# Patient Record
Sex: Male | Born: 1975 | Race: White | Hispanic: Yes | Smoking: Current every day smoker
Health system: Southern US, Community
[De-identification: ages and names within clinical notes are randomized; demographics above are authoritative.]

## PROBLEM LIST (undated history)

## (undated) VITALS — BP 137/81 | HR 91 | Temp 97.8°F | Resp 16 | Ht 70.0 in | Wt 195.1 lb

## (undated) DIAGNOSIS — U071 COVID-19: Secondary | ICD-10-CM

## (undated) DIAGNOSIS — F1020 Alcohol dependence, uncomplicated: Secondary | ICD-10-CM

## (undated) DIAGNOSIS — F209 Schizophrenia, unspecified: Secondary | ICD-10-CM

## (undated) DIAGNOSIS — F329 Major depressive disorder, single episode, unspecified: Secondary | ICD-10-CM

## (undated) DIAGNOSIS — E119 Type 2 diabetes mellitus without complications: Secondary | ICD-10-CM

## (undated) DIAGNOSIS — M1990 Unspecified osteoarthritis, unspecified site: Secondary | ICD-10-CM

## (undated) DIAGNOSIS — F319 Bipolar disorder, unspecified: Secondary | ICD-10-CM

## (undated) DIAGNOSIS — K760 Fatty (change of) liver, not elsewhere classified: Secondary | ICD-10-CM

## (undated) DIAGNOSIS — I10 Essential (primary) hypertension: Secondary | ICD-10-CM

## (undated) DIAGNOSIS — I209 Angina pectoris, unspecified: Secondary | ICD-10-CM

## (undated) DIAGNOSIS — R0600 Dyspnea, unspecified: Secondary | ICD-10-CM

## (undated) DIAGNOSIS — F32A Depression, unspecified: Secondary | ICD-10-CM

## (undated) HISTORY — PX: NASAL SEPTUM SURGERY: SHX37

## (undated) HISTORY — PX: OTHER SURGICAL HISTORY: SHX169

---

## 1998-10-28 ENCOUNTER — Emergency Department (HOSPITAL_COMMUNITY): Admission: EM | Admit: 1998-10-28 | Discharge: 1998-10-28 | Payer: Self-pay | Admitting: Emergency Medicine

## 1999-10-10 ENCOUNTER — Encounter: Payer: Self-pay | Admitting: Emergency Medicine

## 1999-10-10 ENCOUNTER — Emergency Department (HOSPITAL_COMMUNITY): Admission: EM | Admit: 1999-10-10 | Discharge: 1999-10-10 | Payer: Self-pay | Admitting: Emergency Medicine

## 1999-10-20 ENCOUNTER — Emergency Department (HOSPITAL_COMMUNITY): Admission: EM | Admit: 1999-10-20 | Discharge: 1999-10-20 | Payer: Self-pay | Admitting: Emergency Medicine

## 1999-10-22 ENCOUNTER — Emergency Department (HOSPITAL_COMMUNITY): Admission: EM | Admit: 1999-10-22 | Discharge: 1999-10-22 | Payer: Self-pay | Admitting: Emergency Medicine

## 2002-02-28 ENCOUNTER — Encounter: Payer: Self-pay | Admitting: Emergency Medicine

## 2002-02-28 ENCOUNTER — Emergency Department (HOSPITAL_COMMUNITY): Admission: EM | Admit: 2002-02-28 | Discharge: 2002-02-28 | Payer: Self-pay | Admitting: Emergency Medicine

## 2003-05-23 ENCOUNTER — Emergency Department (HOSPITAL_COMMUNITY): Admission: EM | Admit: 2003-05-23 | Discharge: 2003-05-23 | Payer: Self-pay | Admitting: Emergency Medicine

## 2003-05-23 ENCOUNTER — Encounter: Payer: Self-pay | Admitting: Emergency Medicine

## 2003-05-30 ENCOUNTER — Emergency Department (HOSPITAL_COMMUNITY): Admission: EM | Admit: 2003-05-30 | Discharge: 2003-05-31 | Payer: Self-pay | Admitting: Emergency Medicine

## 2011-10-07 ENCOUNTER — Inpatient Hospital Stay: Payer: Self-pay | Admitting: Psychiatry

## 2011-10-24 HISTORY — PX: KNEE SURGERY: SHX244

## 2011-11-20 LAB — CBC
MCHC: 34 g/dL (ref 32.0–36.0)
Platelet: 188 10*3/uL (ref 150–440)
RDW: 13.3 % (ref 11.5–14.5)
WBC: 7 10*3/uL (ref 3.8–10.6)

## 2011-11-20 LAB — COMPREHENSIVE METABOLIC PANEL
Alkaline Phosphatase: 59 U/L (ref 50–136)
Calcium, Total: 9 mg/dL (ref 8.5–10.1)
Co2: 25 mmol/L (ref 21–32)
EGFR (Non-African Amer.): >60
SGOT(AST): 55 U/L — ABNORMAL HIGH (ref 15–37)
SGPT (ALT): 79 U/L — ABNORMAL HIGH

## 2011-11-20 LAB — URINALYSIS, COMPLETE
Glucose,UR: NEGATIVE mg/dL (ref 0–75)
RBC,UR: <1 /HPF (ref 0–5)
Specific Gravity: 1.02 (ref 1.003–1.030)
Squamous Epithelial: NONE SEEN
WBC UR: 1 /HPF (ref 0–5)

## 2011-11-20 LAB — DRUG SCREEN, URINE
Barbiturates, Ur Screen: NEGATIVE (ref ?–200)
Cannabinoid 50 Ng, Ur ~~LOC~~: NEGATIVE (ref ?–50)
Methadone, Ur Screen: NEGATIVE (ref ?–300)
Opiate, Ur Screen: NEGATIVE (ref ?–300)
Phencyclidine (PCP) Ur S: NEGATIVE (ref ?–25)
Tricyclic, Ur Screen: NEGATIVE (ref ?–1000)

## 2011-11-20 LAB — ETHANOL: Ethanol: 63 mg/dL

## 2011-11-21 ENCOUNTER — Inpatient Hospital Stay: Payer: Self-pay | Admitting: Psychiatry

## 2012-02-01 ENCOUNTER — Emergency Department (HOSPITAL_COMMUNITY)
Admission: EM | Admit: 2012-02-01 | Discharge: 2012-02-02 | Disposition: A | Discharge status: Home / Self Care | Payer: Self-pay | Attending: Emergency Medicine | Admitting: Emergency Medicine

## 2012-02-01 ENCOUNTER — Encounter (HOSPITAL_COMMUNITY): Payer: Self-pay | Admitting: Emergency Medicine

## 2012-02-01 DIAGNOSIS — S51809A Unspecified open wound of unspecified forearm, initial encounter: Secondary | ICD-10-CM | POA: Insufficient documentation

## 2012-02-01 DIAGNOSIS — F17200 Nicotine dependence, unspecified, uncomplicated: Secondary | ICD-10-CM | POA: Insufficient documentation

## 2012-02-01 DIAGNOSIS — IMO0002 Reserved for concepts with insufficient information to code with codable children: Secondary | ICD-10-CM

## 2012-02-01 DIAGNOSIS — F3110 Bipolar disorder, current episode manic without psychotic features, unspecified: Secondary | ICD-10-CM | POA: Insufficient documentation

## 2012-02-01 DIAGNOSIS — X789XXA Intentional self-harm by unspecified sharp object, initial encounter: Secondary | ICD-10-CM | POA: Insufficient documentation

## 2012-02-01 HISTORY — DX: Major depressive disorder, single episode, unspecified: F32.9

## 2012-02-01 HISTORY — DX: Depression, unspecified: F32.A

## 2012-02-01 HISTORY — DX: Bipolar disorder, unspecified: F31.9

## 2012-02-01 LAB — CBC
HCT: 43.4 % (ref 39.0–52.0)
Hemoglobin: 14.7 g/dL (ref 13.0–17.0)
WBC: 9.4 10*3/uL (ref 4.0–10.5)

## 2012-02-01 LAB — ETHANOL: Alcohol, Ethyl (B): <11 mg/dL (ref 0–11)

## 2012-02-01 LAB — URINALYSIS, ROUTINE W REFLEX MICROSCOPIC
Glucose, UA: NEGATIVE mg/dL
Leukocytes, UA: NEGATIVE
Nitrite: NEGATIVE
Protein, ur: NEGATIVE mg/dL

## 2012-02-01 LAB — COMPREHENSIVE METABOLIC PANEL
ALT: 59 U/L — ABNORMAL HIGH (ref 0–53)
Albumin: 4.8 g/dL (ref 3.5–5.2)
Alkaline Phosphatase: 79 U/L (ref 39–117)
BUN: 17 mg/dL (ref 6–23)
Chloride: 101 mEq/L (ref 96–112)
GFR calc Af Amer: >90 mL/min (ref >90)
Glucose, Bld: 91 mg/dL (ref 70–99)
Potassium: 4.2 mEq/L (ref 3.5–5.1)
Sodium: 138 mEq/L (ref 135–145)
Total Bilirubin: 0.5 mg/dL (ref 0.3–1.2)

## 2012-02-01 MED ORDER — ZOLPIDEM TARTRATE 5 MG PO TABS: 5.0000 mg | ORAL_TABLET | Freq: Every evening | ORAL | Status: DC | PRN

## 2012-02-01 MED ORDER — RISPERIDONE 2 MG PO TABS
3.0000 mg | ORAL_TABLET | Freq: Two times a day (BID) | ORAL | Status: DC
  Administered 2012-02-02: 3 mg via ORAL
  Filled 2012-02-01: qty 1

## 2012-02-01 MED ORDER — ONDANSETRON HCL 4 MG PO TABS
4.0000 mg | ORAL_TABLET | Freq: Three times a day (TID) | ORAL | Status: DC | PRN
Start: 2012-02-01 — End: 2012-02-02

## 2012-02-01 MED ORDER — ACETAMINOPHEN 325 MG PO TABS: 650.0000 mg | ORAL_TABLET | ORAL | Status: DC | PRN

## 2012-02-01 MED ORDER — NICOTINE 21 MG/24HR TD PT24
21.0000 mg | MEDICATED_PATCH | Freq: Every day | TRANSDERMAL | Status: DC
  Administered 2012-02-02: 21 mg via TRANSDERMAL
  Filled 2012-02-01: qty 1

## 2012-02-01 MED ORDER — TRAZODONE HCL 100 MG PO TABS
100.0000 mg | ORAL_TABLET | Freq: Every day | ORAL | Status: DC
  Administered 2012-02-02: 100 mg via ORAL
  Filled 2012-02-01: qty 1

## 2012-02-01 MED ORDER — IBUPROFEN 600 MG PO TABS: 600.0000 mg | ORAL_TABLET | Freq: Three times a day (TID) | ORAL | Status: DC | PRN

## 2012-02-01 MED ORDER — ALUM & MAG HYDROXIDE-SIMETH 200-200-20 MG/5ML PO SUSP: 30.0000 mL | ORAL | Status: DC | PRN

## 2012-02-01 NOTE — ED Notes (Signed)
Pt states "I dont wanna do this anymore, i dont wanna be here", per [employer], pt can be IVC

## 2012-02-01 NOTE — ED Notes (Signed)
Bed:[facility]<BR> Expected date:<BR> Expected time:<BR> Means of arrival:<BR> Comments:<BR> Not Ready

## 2012-02-01 NOTE — ED Provider Notes (Signed)
History     CSN: [id number]  Arrival date & time 02/01/12  2148   First MD Initiated Contact with Patient 02/01/12 2315      Chief Complaint  Patient presents with  . Medical Clearance  . Depression    (Consider location/radiation/quality/duration/timing/severity/associated sxs/prior treatment) HPI  Patient presents to the Emergency department by [employer]. [employer] was called by Behavioral health because patient was not taking medications. The patient is VERY agitated. He states that he was told he can come voluntarily or be IVC'd. He is currently here voluntarily but says if he can't leave soon he will loose everything and be homeless when he gets out of [employer]. He has fresh superficial cuts to his left forearm that says "Maggie". When questioned about it, he says that it is none of my business.   Past Medical History  Diagnosis Date  . Depressed   . Bipolar 1 disorder     History reviewed. No pertinent past surgical history.  No family history on file.  History  Substance Use Topics  . Smoking status: Current Everyday Smoker -- 1.0 packs/day    Types: Cigarettes  . Smokeless tobacco: Not on file  . Alcohol Use: No      Review of Systems  All other systems reviewed and are negative.    Allergies  Penicillins  Home Medications   Current Outpatient Rx  Name Route Sig Dispense Refill  . CLONAZEPAM 0.5 MG PO TABS Oral Take 0.5 mg by mouth 2 (two) times daily as needed.    [name] RISPERIDONE 3 MG PO TABS Oral Take 3 mg by mouth 2 (two) times daily.    . TRAZODONE HCL 100 MG PO TABS Oral Take 100 mg by mouth at bedtime.      BP 128/74  Pulse 98  Temp 98 F (36.7 C)  Resp 16  Wt 240 lb (108.863 kg)  SpO2 99%  Physical Exam  Nursing note and vitals reviewed. Constitutional: He appears well-developed and well-nourished. No distress.       Pt agitated and angry.  HENT:  Head: Normocephalic and atraumatic.  Eyes: Pupils are equal, round, and reactive to light.    Neck: Normal range of motion. Neck supple.  Cardiovascular: Normal rate and regular rhythm.   Pulmonary/Chest: Effort normal.  Abdominal: Soft.  Neurological: He is alert.  Skin: Skin is warm and dry.    ED Course  Procedures (including critical care time)  Labs Reviewed  COMPREHENSIVE METABOLIC PANEL - Abnormal; Notable for the following:    Total Protein 8.4 (*)    AST 39 (*)    ALT 59 (*)    GFR calc non Af Amer 82 (*)    All other components within normal limits  CBC  ETHANOL  URINE RAPID DRUG SCREEN (HOSP PERFORMED)  URINALYSIS, ROUTINE W REFLEX MICROSCOPIC   No results found.   1. Self-harm       MDM  Pt to be  Consulted by [facility].        Dorthula Matas, PA 02/01/12 2341

## 2012-02-01 NOTE — ED Notes (Signed)
Lab bedside.

## 2012-02-01 NOTE — ED Notes (Signed)
Pt alert, nad, presents voluntary by [employer], pt with SI, resp even unlabored, skin pwd

## 2012-02-02 LAB — RAPID URINE DRUG SCREEN, HOSP PERFORMED
Amphetamines: NOT DETECTED
Barbiturates: NOT DETECTED
Benzodiazepines: NOT DETECTED

## 2012-02-02 MED ORDER — CLONAZEPAM 0.5 MG PO TABS: 0.5000 mg | ORAL_TABLET | Freq: Every evening | ORAL | Status: DC | PRN

## 2012-02-02 MED ORDER — RISPERIDONE 3 MG PO TABS: 3.0000 mg | ORAL_TABLET | Freq: Two times a day (BID) | ORAL | Status: DC

## 2012-02-02 MED ORDER — TRAZODONE HCL 100 MG PO TABS: 100.0000 mg | ORAL_TABLET | Freq: Every day | ORAL | Status: DC

## 2012-02-02 NOTE — BH Assessment (Addendum)
Assessment Note   Cory Floyd is an 36 y.o. male who was brought in by [facility] after getting a report that pt has been off of his medications.  Pt is here voluntarily. Pt states he has been under a lot of pressure to finish a remodeling project. He states that his mother hired him to assist her with remodeling a home. He states that he went off of his prescribed Risperdal, Klonopin, and Trazodone because he felt like he could not focus on the remodeling project while on medication. Pt reports "i'm cutting tile and using tools that I could get hurt with if I loose focus and those medications make me loose focus." Pt states he needs to finish the project or his mother will not let him continue to live with her. Pt expresses concerns about inpatient treatment treatment, stating he if he stays, he will become homeless. Pt also presents to the ED with superficial cuts on his arm that spell "Maggie." When asked about cuts, pt reports his girlfriend of five years broke up with him 2 days ago. He states he cut his arm because "its a different kind of pain, an easier pain" he further states " I wasn't trying to kill myself, I was trying to make the other pain go away."  Pt denies and SI, HI, AHVH, and SA. Pt reports he has history of [facility] and has attempted suicide in the past. Pt reports numerous prior mental health treatment at [facility] and [facility] in [place]. Pt states his last hospitalization was 6 months ago. He states he has ongoing outpatient therapy with [facility] in [place], [phone]).He states he has an appointment with [facility] tomorrow at 1:30. Pt states he has had difficulty sleeping for the past 2 weeks and reports a poor appetite. Pt reports he feels like he can contract for safety at this time and is requesting to be released with a plan to follow up with his current providers.        Axis I: Bipolar, Manic Axis II: Deferred Axis III:  Past Medical History  Diagnosis  Date  . Depressed   . Bipolar 1 disorder    Axis IV: other psychosocial or environmental problems, problems related to social environment and problems with primary support group Axis V: 31-40 impairment in reality testing  Past Medical History:  Past Medical History  Diagnosis Date  . Depressed   . Bipolar 1 disorder     History reviewed. No pertinent past surgical history.  Family History: No family history on file.  Social History:  reports that he has been smoking Cigarettes.  He has been smoking about 1 pack per day. He does not have any smokeless tobacco history on file. He reports that he does not drink alcohol. His drug history not on file.  Additional Social History:  Alcohol / Drug Use History of alcohol / drug use?: No history of alcohol / drug abuse Allergies:  Allergies  Allergen Reactions  . Penicillins     Home Medications:  Medications Prior to Admission  Medication Dose Route Frequency Provider Last Rate Last Dose  . acetaminophen (TYLENOL) tablet 650 mg  650 mg Oral Q4H PRN Dorthula Matas, PA      . alum & mag hydroxide-simeth (MAALOX/MYLANTA) 200-200-20 MG/5ML suspension 30 mL  30 mL Oral PRN Dorthula Matas, PA      . ibuprofen (ADVIL,MOTRIN) tablet 600 mg  600 mg Oral Q8H PRN Dorthula Matas, PA      .  nicotine (NICODERM CQ - dosed in mg/24 hours) patch 21 mg  21 mg Transdermal Daily Dorthula Matas, PA   21 mg at 02/02/12 0029  . ondansetron (ZOFRAN) tablet 4 mg  4 mg Oral Q8H PRN Dorthula Matas, PA      . risperiDONE (RISPERDAL) tablet 3 mg  3 mg Oral BID Dorthula Matas, PA   3 mg at 02/02/12 0030  . traZODone (DESYREL) tablet 100 mg  100 mg Oral QHS Dorthula Matas, PA   100 mg at 02/02/12 0030  . zolpidem (AMBIEN) tablet 5 mg  5 mg Oral QHS PRN Dorthula Matas, PA       No current outpatient prescriptions on file as of 02/01/2012.    OB/GYN Status:  No LMP for male patient.  General Assessment Data [facility] of Assessment[facility]  Arrangements: Parent Can pt return to current living arrangement?: Yes Admission Status: Voluntary Is patient capable of signing voluntary admission?: Yes Transfer from: Acute Hospital Referral Source: MD  Education Status Is patient currently in school?: No  Risk to self Suicidal Ideation: No Suicidal Intent: No Is patient at risk for suicide?: Yes Suicidal Plan?: No Access to Means: No What has been your use of drugs/alcohol within the last 12 months?: denies Previous Attempts/Gestures: Yes How many times?: 3  Other Self Harm Risks: cutting Triggers for Past Attempts: Other personal contacts;Hallucinations Intentional Self Injurious Behavior: Cutting Family Suicide History: No Recent stressful life event(s):  (reports pressures from work) Persecutory voices/beliefs?: No Depression: Yes Depression Symptoms: Feeling angry/irritable;Insomnia Substance abuse history and/or treatment for substance abuse?: No Suicide prevention information given to non-admitted patients: Not applicable  Risk to Others Homicidal Ideation: No Thoughts of Harm to Others: No Current Homicidal Intent: No Current Homicidal Plan: No Access to Homicidal Means: No Identified Victim: none History of harm to others?: No Assessment of Violence: None Noted Violent Behavior Description: cooperative Does patient have access to weapons?: No Criminal Charges Pending?: No Does patient have a court date: No  Psychosis Hallucinations: None noted (pt reports history of AHVH but none currently) Delusions: None noted  Mental Status Report Appear/Hygiene: Disheveled Eye Contact: Good Motor Activity: Unremarkable Speech: Logical/coherent Level of Consciousness: Alert Mood: Anxious Affect: Anxious Anxiety Level: Severe Thought Processes: Coherent;Relevant Judgement: Impaired Orientation: Person;Place;Time;Situation Obsessive Compulsive Thoughts/Behaviors: Minimal  Cognitive Functioning Concentration:  Normal Memory: Recent Intact;Remote Intact IQ: Average Insight: Fair Impulse Control: Poor Appetite: Poor Weight Loss: 0  Weight Gain: 0  Sleep: Decreased Total Hours of Sleep:  (reports hasn't slept much for 2 weeks.) Vegetative Symptoms: None  Prior Inpatient Therapy Prior Inpatient Therapy: Yes Prior Therapy Dates: 2011,2012 Prior [facility]ilty[facility]Provider[facility] Reason for Treatment: bipolar and suicide attempt  Prior Outpatient Therapy Prior Outpatient Therapy: Yes Prior Therapy Dates: ongoing Prior Therapy Facilty/Provider(s): [facility] Reason for Treatment: bipolar disorder  ADL Screening (condition at time of admission) Patient's cognitive ability adequate to safely complete daily activities?: Yes Patient able to express need for assistance with ADLs?: Yes Independently performs ADLs?: Yes Weakness of Legs: None Weakness of Arms/Hands: None  Home Assistive Devices/Equipment Home Assistive Devices/Equipment: None    Abuse/Neglect Assessment (Assessment to be complete while patient is alone) Physical Abuse: Denies Verbal Abuse: Denies Sexual Abuse: Denies Exploitation of patient/patient's resources: Denies Self-Neglect: Denies Values / Beliefs Cultural Requests During Hospitalization: None Spiritual Requests During Hospitalization: None   Advance Directives (For Healthcare) Advance Directive: Patient does not have advance directive;Patient would not  like information Nutrition Screen Diet: Regular Unintentional weight loss greater than 10lbs within the last month: No Problems chewing or swallowing foods and/or liquids: No Home Tube Feeding or Total Parenteral Nutrition (TPN): No Patient appears severely malnourished: No  Additional Information 1:1 In Past 12 Months?: No CIRT Risk: No Elopement Risk: No Does patient have medical clearance?: Yes     Disposition:  Disposition Disposition of Patient: Other  dispositions (pending telepsych)  On Site Evaluation by:   Reviewed with Physician:     Marjean Donna 02/02/2012 2:38 AM

## 2012-02-02 NOTE — ED Provider Notes (Signed)
Pt seen and evaluated in the psych ED.  He is calm, resting comfortably, requesting something to eat, and also requesting to be discharged as he states he needs to go to work.  Telepsych consult has been obtained and states patient is cleared for discharge- should be discharged on risperdal 3mg  po qD, trazadone 100mg  po qHS, klonopin 0.5mg  po qHS- I have given him small rx for each of these.  He is strongly encouraged to f/u with his doctor or mental health provider in the next several days.    Ethelda Chick, MD 02/02/12 [phone]

## 2012-02-02 NOTE — Discharge Instructions (Signed)
Return to the ED with any concerns including thoughts or feelings of suicide or homicide 

## 2012-02-03 NOTE — ED Provider Notes (Signed)
Medical screening examination/treatment/procedure(s) were performed by non-physician practitioner and as supervising physician I was immediately available for consultation/collaboration.   Travez Stancil, MD 02/03/12 0048 

## 2012-03-27 ENCOUNTER — Inpatient Hospital Stay: Payer: Self-pay | Admitting: Psychiatry

## 2012-03-27 LAB — COMPREHENSIVE METABOLIC PANEL
Alkaline Phosphatase: 73 U/L (ref 50–136)
Anion Gap: 10 (ref 7–16)
Chloride: 107 mmol/L (ref 98–107)
EGFR (Non-African Amer.): >60
Potassium: 3.8 mmol/L (ref 3.5–5.1)
SGOT(AST): 38 U/L — ABNORMAL HIGH (ref 15–37)
SGPT (ALT): 43 U/L
Sodium: 142 mmol/L (ref 136–145)

## 2012-03-27 LAB — DRUG SCREEN, URINE
Amphetamines, Ur Screen: NEGATIVE (ref ?–1000)
Barbiturates, Ur Screen: NEGATIVE (ref ?–200)
Cannabinoid 50 Ng, Ur ~~LOC~~: NEGATIVE (ref ?–50)
Cocaine Metabolite,Ur ~~LOC~~: NEGATIVE (ref ?–300)
MDMA (Ecstasy)Ur Screen: NEGATIVE (ref ?–500)
Methadone, Ur Screen: NEGATIVE (ref ?–300)
Opiate, Ur Screen: NEGATIVE (ref ?–300)
Tricyclic, Ur Screen: NEGATIVE (ref ?–1000)

## 2012-03-27 LAB — ETHANOL: Ethanol %: 0.185 % — ABNORMAL HIGH (ref 0.000–0.080)

## 2012-03-27 LAB — CBC
MCH: 31.3 pg (ref 26.0–34.0)
MCV: 96 fL (ref 80–100)
RBC: 4.56 10*6/uL (ref 4.40–5.90)
RDW: 13.8 % (ref 11.5–14.5)

## 2012-03-27 LAB — SALICYLATE LEVEL: Salicylates, Serum: 4 mg/dL — ABNORMAL HIGH

## 2012-03-27 LAB — TSH: Thyroid Stimulating Horm: 2.13 u[IU]/mL

## 2012-03-30 ENCOUNTER — Inpatient Hospital Stay: Payer: Self-pay | Admitting: Psychiatry

## 2012-03-30 LAB — DRUG SCREEN, URINE
Amphetamines, Ur Screen: NEGATIVE (ref ?–1000)
Barbiturates, Ur Screen: NEGATIVE (ref ?–200)
Cocaine Metabolite,Ur ~~LOC~~: NEGATIVE (ref ?–300)
MDMA (Ecstasy)Ur Screen: NEGATIVE (ref ?–500)
Phencyclidine (PCP) Ur S: NEGATIVE (ref ?–25)
Tricyclic, Ur Screen: NEGATIVE (ref ?–1000)

## 2012-03-30 LAB — COMPREHENSIVE METABOLIC PANEL
Albumin: 4.3 g/dL (ref 3.4–5.0)
Alkaline Phosphatase: 74 U/L (ref 50–136)
Anion Gap: 11 (ref 7–16)
BUN: 9 mg/dL (ref 7–18)
Calcium, Total: 8.9 mg/dL (ref 8.5–10.1)
Chloride: 105 mmol/L (ref 98–107)
Co2: 24 mmol/L (ref 21–32)
Creatinine: 0.85 mg/dL (ref 0.60–1.30)
EGFR (African American): >60
EGFR (Non-African Amer.): >60
Glucose: 79 mg/dL (ref 65–99)
Osmolality: 277 (ref 275–301)
Potassium: 3.5 mmol/L (ref 3.5–5.1)
SGOT(AST): 37 U/L (ref 15–37)
SGPT (ALT): 53 U/L
Sodium: 140 mmol/L (ref 136–145)
Total Protein: 8.1 g/dL (ref 6.4–8.2)

## 2012-03-30 LAB — CBC
HCT: 43.9 % (ref 40.0–52.0)
HGB: 14.6 g/dL (ref 13.0–18.0)
MCV: 95 fL (ref 80–100)
RBC: 4.62 10*6/uL (ref 4.40–5.90)
RDW: 13.7 % (ref 11.5–14.5)

## 2012-04-02 LAB — CARBAMAZEPINE LEVEL, TOTAL: Carbamazepine: 6.5 ug/mL (ref 4.0–12.0)

## 2012-04-24 LAB — CBC
HCT: 39.9 % — ABNORMAL LOW (ref 40.0–52.0)
HGB: 13.3 g/dL (ref 13.0–18.0)
MCH: 31.6 pg (ref 26.0–34.0)
MCHC: 33.3 g/dL (ref 32.0–36.0)
RDW: 13.5 % (ref 11.5–14.5)

## 2012-04-24 LAB — URINALYSIS, COMPLETE
Bacteria: NONE SEEN
Blood: NEGATIVE
Glucose,UR: NEGATIVE mg/dL (ref 0–75)
Leukocyte Esterase: NEGATIVE
Nitrite: NEGATIVE
RBC,UR: NONE SEEN /HPF (ref 0–5)

## 2012-04-24 LAB — TSH: Thyroid Stimulating Horm: 0.985 u[IU]/mL

## 2012-04-24 LAB — COMPREHENSIVE METABOLIC PANEL
Bilirubin,Total: 0.2 mg/dL (ref 0.2–1.0)
Calcium, Total: 8.1 mg/dL — ABNORMAL LOW (ref 8.5–10.1)
Chloride: 108 mmol/L — ABNORMAL HIGH (ref 98–107)
Co2: 29 mmol/L (ref 21–32)
EGFR (African American): >60
EGFR (Non-African Amer.): >60
SGOT(AST): 35 U/L (ref 15–37)
SGPT (ALT): 42 U/L

## 2012-04-24 LAB — ETHANOL: Ethanol: 176 mg/dL

## 2012-04-25 ENCOUNTER — Inpatient Hospital Stay: Payer: Self-pay | Admitting: Internal Medicine

## 2012-04-25 LAB — DRUG SCREEN, URINE
Barbiturates, Ur Screen: NEGATIVE (ref ?–200)
Benzodiazepine, Ur Scrn: NEGATIVE (ref ?–200)
Cocaine Metabolite,Ur ~~LOC~~: NEGATIVE (ref ?–300)
Methadone, Ur Screen: NEGATIVE (ref ?–300)
Tricyclic, Ur Screen: NEGATIVE (ref ?–1000)

## 2012-04-25 LAB — BASIC METABOLIC PANEL
BUN: 8 mg/dL (ref 7–18)
Calcium, Total: 8.1 mg/dL — ABNORMAL LOW (ref 8.5–10.1)
Creatinine: 1.02 mg/dL (ref 0.60–1.30)
EGFR (African American): >60
EGFR (Non-African Amer.): >60
Glucose: 114 mg/dL — ABNORMAL HIGH (ref 65–99)
Potassium: 4.1 mmol/L (ref 3.5–5.1)
Sodium: 144 mmol/L (ref 136–145)

## 2012-04-26 LAB — CBC WITH DIFFERENTIAL/PLATELET
Basophil #: 0 10*3/uL (ref 0.0–0.1)
HCT: 39.4 % — ABNORMAL LOW (ref 40.0–52.0)
Lymphocyte #: 1.7 10*3/uL (ref 1.0–3.6)
MCHC: 33.6 g/dL (ref 32.0–36.0)
MCV: 95 fL (ref 80–100)
Monocyte %: 6.9 %
Neutrophil #: 7.1 10*3/uL — ABNORMAL HIGH (ref 1.4–6.5)
RDW: 13.4 % (ref 11.5–14.5)
WBC: 9.6 10*3/uL (ref 3.8–10.6)

## 2012-04-26 LAB — BASIC METABOLIC PANEL
Co2: 28 mmol/L (ref 21–32)
Creatinine: 1.15 mg/dL (ref 0.60–1.30)
EGFR (African American): >60
EGFR (Non-African Amer.): >60
Sodium: 140 mmol/L (ref 136–145)

## 2012-04-29 ENCOUNTER — Inpatient Hospital Stay: Payer: Self-pay | Admitting: Unknown Physician Specialty

## 2012-10-30 LAB — COMPREHENSIVE METABOLIC PANEL
Albumin: 4.1 g/dL (ref 3.4–5.0)
Anion Gap: 12 (ref 7–16)
Chloride: 105 mmol/L (ref 98–107)
Co2: 22 mmol/L (ref 21–32)
EGFR (Non-African Amer.): >60
Glucose: 88 mg/dL (ref 65–99)
SGPT (ALT): 71 U/L (ref 12–78)
Total Protein: 8.4 g/dL — ABNORMAL HIGH (ref 6.4–8.2)

## 2012-10-30 LAB — CBC
HCT: 40.6 % (ref 40.0–52.0)
HGB: 13.8 g/dL (ref 13.0–18.0)
MCH: 31.6 pg (ref 26.0–34.0)
MCV: 93 fL (ref 80–100)
Platelet: 197 10*3/uL (ref 150–440)
RBC: 4.37 10*6/uL — ABNORMAL LOW (ref 4.40–5.90)
RDW: 12.7 % (ref 11.5–14.5)
WBC: 7.9 10*3/uL (ref 3.8–10.6)

## 2012-10-30 LAB — ACETAMINOPHEN LEVEL: Acetaminophen: <2 ug/mL

## 2012-10-30 LAB — ETHANOL: Ethanol %: 0.123 % — ABNORMAL HIGH (ref 0.000–0.080)

## 2012-10-31 ENCOUNTER — Inpatient Hospital Stay: Payer: Self-pay | Admitting: Psychiatry

## 2012-10-31 LAB — URINALYSIS, COMPLETE
Bacteria: NONE SEEN
Bilirubin,UR: NEGATIVE
Blood: NEGATIVE
Glucose,UR: NEGATIVE mg/dL (ref 0–75)
Ketone: NEGATIVE
Leukocyte Esterase: NEGATIVE
Nitrite: NEGATIVE
Ph: 6 (ref 4.5–8.0)
Protein: NEGATIVE
RBC,UR: NONE SEEN /HPF (ref 0–5)
Specific Gravity: 1.002 (ref 1.003–1.030)
Squamous Epithelial: NONE SEEN
WBC UR: NONE SEEN /HPF (ref 0–5)

## 2012-10-31 LAB — DRUG SCREEN, URINE
Amphetamines, Ur Screen: NEGATIVE (ref ?–1000)
Barbiturates, Ur Screen: NEGATIVE (ref ?–200)
Benzodiazepine, Ur Scrn: NEGATIVE (ref ?–200)
Cannabinoid 50 Ng, Ur ~~LOC~~: NEGATIVE (ref ?–50)
Cocaine Metabolite,Ur ~~LOC~~: NEGATIVE (ref ?–300)
Methadone, Ur Screen: NEGATIVE (ref ?–300)
Tricyclic, Ur Screen: NEGATIVE (ref ?–1000)

## 2012-10-31 LAB — SALICYLATE LEVEL: Salicylates, Serum: 6.1 mg/dL — ABNORMAL HIGH

## 2012-11-01 LAB — BEHAVIORAL MEDICINE 1 PANEL
Albumin: 3.6 g/dL (ref 3.4–5.0)
Alkaline Phosphatase: 76 U/L (ref 50–136)
Anion Gap: 9 (ref 7–16)
BUN: 15 mg/dL (ref 7–18)
Basophil #: 0 10*3/uL (ref 0.0–0.1)
Basophil %: 0.8 %
Bilirubin,Total: 0.2 mg/dL (ref 0.2–1.0)
Calcium, Total: 8.8 mg/dL (ref 8.5–10.1)
Chloride: 107 mmol/L (ref 98–107)
Co2: 24 mmol/L (ref 21–32)
Creatinine: 0.93 mg/dL (ref 0.60–1.30)
EGFR (African American): >60
EGFR (Non-African Amer.): >60
Eosinophil #: 0.2 10*3/uL (ref 0.0–0.7)
Eosinophil %: 2.7 %
Glucose: 108 mg/dL — ABNORMAL HIGH (ref 65–99)
HCT: 39.1 % — ABNORMAL LOW (ref 40.0–52.0)
HGB: 13.1 g/dL (ref 13.0–18.0)
Lymphocyte #: 2.1 10*3/uL (ref 1.0–3.6)
Lymphocyte %: 33.4 %
MCH: 31.5 pg (ref 26.0–34.0)
MCHC: 33.5 g/dL (ref 32.0–36.0)
MCV: 94 fL (ref 80–100)
Monocyte #: 0.5 x10 3/mm (ref 0.2–1.0)
Monocyte %: 8.8 %
Neutrophil #: 3.4 10*3/uL (ref 1.4–6.5)
Neutrophil %: 54.3 %
Osmolality: 281 (ref 275–301)
Platelet: 180 10*3/uL (ref 150–440)
Potassium: 3.9 mmol/L (ref 3.5–5.1)
RBC: 4.16 10*6/uL — ABNORMAL LOW (ref 4.40–5.90)
RDW: 13.1 % (ref 11.5–14.5)
SGOT(AST): 34 U/L (ref 15–37)
SGPT (ALT): 59 U/L (ref 12–78)
Sodium: 140 mmol/L (ref 136–145)
Thyroid Stimulating Horm: 1.22 u[IU]/mL
Total Protein: 7.2 g/dL (ref 6.4–8.2)
WBC: 6.3 10*3/uL (ref 3.8–10.6)

## 2012-11-01 LAB — SALICYLATE LEVEL: Salicylates, Serum: 4.1 mg/dL — ABNORMAL HIGH

## 2013-07-25 ENCOUNTER — Ambulatory Visit: Payer: Self-pay | Admitting: Specialist

## 2013-08-11 ENCOUNTER — Ambulatory Visit: Payer: Self-pay | Admitting: Specialist

## 2013-08-18 ENCOUNTER — Ambulatory Visit: Payer: Self-pay | Admitting: Specialist

## 2014-02-05 HISTORY — PX: COLONOSCOPY: SHX174
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
## 2015-02-12 NOTE — H&P (Signed)
PATIENT NAME:  Cory Floyd, Cory Floyd MR#:  [id number] DATE OF BIRTH:  [birth year]  DATE OF ADMISSION:  10/31/2012  REFERRING PHYSICIAN: Dr. Daryel November.   ATTENDING PHYSICIAN: Kristine Linea, MD  IDENTIFYING DATA: The patient is a 39 year old male with history of bipolar disorder.   CHIEF COMPLAINT: "They changed my meds."   HISTORY OF PRESENT ILLNESS: The patient is a patient of Dr. Rogers Blocker at [facility]. Several weeks ago, Dr. Rogers Blocker made medication changes. He discontinued Tegretol that the patient has been taking for the past 2 or 3 years and increased Zyprexa from 10 to 20 mg. The patient complains that he started experiencing mood swings with deep depression and thoughts of suicide, with a plan to overdose on medications. He also feels that he has been shaking and believes that this is from Zyprexa. He wants medication change. He endorses poor sleep, decreased appetite, anhedonia, feeling of guilt, hopelessness, worthlessness, irritability, social isolation, poor energy and concentration, increased anxiety. The patient has been under considerable stress. He had a several-year relationship with a woman in [place]. He went there in November and spent a whole month with her. He felt that the relationship was strong. Before Christmas, he has learned that the woman does not want to have anything to do with him. He is very lonely and depressed.   PAST PSYCHIATRIC HISTORY: The patient has multiple hospitalizations following multiple suicide attempts. He has been tried on numerous medications including lithium, Depakote, Seroquel, Invega, Sustenna, Zyprexa. He has been stable and happy with a combination of Tegretol and Zyprexa, and now, he is willing to go back to lithium. He denies excessive drinking, substance abuse or prescription pill misuse.   FAMILY PSYCHIATRIC HISTORY: His uncle committed suicide. He has an aunt and the mother with bipolar.   PAST MEDICAL HISTORY: None.   ALLERGIES:  PENICILLIN.   MEDICATIONS ON ADMISSION: Zyprexa 20 mg daily.   SOCIAL HISTORY: He is unemployed. He applied for disability. He is waiting for a court date. He has a history of arrest for indecent liberties with a child. There is a history of DUIs. He denies any current charges pending. He admits to excessive drinking in the past, but not currently.   REVIEW OF SYSTEMS:   CONSTITUTIONAL: No fevers or chills. Positive for weight gain.  EYES: No double or blurred vision.  ENT: No hearing loss.  RESPIRATORY: No shortness of breath or cough.  CARDIOVASCULAR: No chest pain or orthopnea.  GASTROINTESTINAL: No abdominal pain, nausea, vomiting or diarrhea.  GU: No incontinence or frequency.  ENDOCRINE: No heat or cold intolerance.  LYMPHATIC: No anemia or easy bruising.  INTEGUMENTARY: No acne or rash.  MUSCULOSKELETAL: No muscle or joint pain.  NEUROLOGIC: No tingling or weakness.  PSYCHIATRIC: See history of present illness for details.   PHYSICAL EXAMINATION:  VITAL SIGNS: Blood pressure 114/70, pulse 98, respirations 20, temperature 96.5.  GENERAL: This is an obese male in no acute distress.  HEENT: The pupils are equal, round and reactive to light.  NECK: Supple. No thyromegaly.  LUNGS: Clear to auscultation. No dullness to percussion.  HEART: Regular rhythm and rate. No murmurs, rubs or gallops.  ABDOMEN: Soft, nontender, nondistended. Positive bowel sounds.  MUSCULOSKELETAL: Normal muscle strength in all extremities.  SKIN: No rashes or bruises.  LYMPHATIC: No cervical adenopathy.  NEUROLOGIC: Cranial nerves II through XII are intact.   LABORATORY DATA: Chemistries are within normal limits. Blood alcohol level 0.123. LFTs within normal limits. TSH 1.22. Urine tox screen negative  for substances. CBC within normal limits. Urinalysis is not suggestive of urinary tract infection. Serum acetaminophen less than 2. Serum salicylates 4.1.   MENTAL STATUS EXAMINATION ON ADMISSION: The  patient is alert and oriented to person, place, time and situation. He is pleasant, polite and cooperative. He is wearing hospital scrubs and a yellow shirt. He maintains good eye contact. His speech is of normal rhythm, rate and volume. Mood is depressed with a tearful affect. Thought process is logical and goal oriented. Thought content: He denies suicidal or homicidal ideations at the moment, but was admitted following worsening of depression with suicidal ideation and a plan to overdose on pills. There are no delusions or paranoia. There are no auditory or visual hallucinations. His cognition is grossly intact. He registers 3 out of 3 and recalls 3 out of 3 objects after 5 minutes. He can spell world forwards and backwards. He knows the current president. His insight and judgment are fair.   SUICIDE RISK ASSESSMENT ON ADMISSION: This is a patient with long history of depression and mood instability who experienced worsening of depression and suicidal ideation in the context of recent loss, substance abuse and medication changes.   INITIAL DIAGNOSES:  AXIS I: Bipolar affective disorder, not otherwise specified. Alcohol abuse.  AXIS II: Deferred.  AXIS III: Obesity.  AXIS IV: Mental illness, substance abuse, employment, financial, family conflict, primary support, access to care.  AXIS V: Global assessment of functioning on admission 25.   PLAN: The patient was admitted to [facility] for safety, stabilization and medication management. He was initially placed on suicide precautions and was closely monitored for any unsafe behaviors. He underwent full psychiatric and risk assessment. He received pharmacotherapy, individual and group psychotherapy, substance abuse counseling and support from therapeutic milieu.   1. Suicidal ideation: The patient is able to contract for safety.  2. Mood: The patient is asking to restart a mood stabilizer and chooses  lithium, and decrease Zyprexa to his regular dose of 10 mg.  3. Alcohol dependence. He was started on a CIWA protocol.  4. Substance abuse treatment. At present, the patient minimizes his problems and declines treatment.  5. Disposition: He will be most likely discharged to home. He will follow up with [facility].   ____________________________ Ellin GoodieJolanta B. Jennet MaduroPucilowska, MD jbp:OSi D: 11/01/2012 11:45:56 ET T: 11/01/2012 12:09:17 ET JOB#: [id number]  cc: Hank Walling B. Jennet MaduroPucilowska, MD, <Dictator> Shari ProwsJOLANTA B Jalexa Pifer MD ELECTRONICALLY SIGNED 11/04/2012 0:07

## 2015-02-12 NOTE — Op Note (Signed)
PATIENT NAME:  Cory Floyd, Cory Floyd MR#:  [id number] DATE OF BIRTH:  [birth year]  DATE OF PROCEDURE:  08/18/2013  PREOPERATIVE DIAGNOSES:   1.  Complete old tear of the right anterior cruciate ligament. 2.  Completely displaced, old, chronic tear of the right medial meniscus.  3.  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   POSTOPERATIVE DIAGNOSES:   1.  Complete old tear of the right anterior cruciate ligament. 2.  Completely displaced, old, chronic tear of the right medial meniscus.  3.  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   PROCEDURES:  1.  Arthroscopically assisted right anterior cruciate ligament reconstruction using patellar tendon and bone.  2.  Arthroscopic partial right medial meniscectomy.  3.  Arthroscopic chondroplasty, medial femoral condyle.  4.  Arthroscopic partial synovectomy for visualization.   SURGEON: Valinda Hoar, M.D.   ANESTHESIA: General endotracheal plus femoral nerve block.   COMPLICATIONS: None.   DRAINS: None.   ESTIMATED BLOOD LOSS: 50 mL   REPLACEMENT: None.   DESCRIPTION OF PROCEDURE: The patient was brought to the Operating Room, where he underwent satisfactory general endotracheal anesthesia, after a femoral nerve block on the right. The right leg was prepped and draped in sterile fashion, and arthroscopy carried out through standard portals. There was extensive scarring of the knee, which made visualization extremely difficult. Tedious resection of synovium and scar was done create visualization. Once this was accomplished, the medial meniscus was seen to be wedged anteriorly between the tibia and femur and the intercondylar notch. This was pushed back into place but was not reducible. It was obviously chronic in nature, and there was no way to get it into a repairable position. Therefore, the torn portion of the meniscus was tediously removed with basket forceps, ArthroCare wand,  and motorized resectors.   Once this was accomplished, there was seen to be a remnant of meniscus in place, which was trimmed up and left in place. There was grade II chondromalacia on the medial femoral condyle, which was gently debrided. Intercondylar notch showed complete tearing of the anterior cruciate ligament with some scar tissue present. This was excised. The lateral compartment showed normal meniscus and articular surfaces. The trochlea of the femur had grade II chondromalacia; the patella had minimal changes. Anterior synovium was resected medially and laterally for visualization. Notchplasty was carried out.   The arthroscope was removed and a longitudinal incision made from the joint line distally to the tibial tubercle anteromedially. Dissection was carried out sharply through subcutaneous tissue and the peritenon was reflected laterally. An 11 mm graft was taken from the midportion of the patellar tendon, with patellar bone and tibial tubercle bone as well. This was contoured to an 11 mm graft on the back table with drill holes. The arthroscope was reintroduced and the tibial alignment jig inserted.   A guidepin was inserted in good position. An 11 mm tunnel was made in the tibia at 60 degrees. This was smoothed with a tamp and a rasp. The 7 mm femoral over-the-top guide was inserted and the Beath pin brought out on the anterior thigh with the knee flexed to 90 degrees. A femoral tunnel was made with an 11 mm reamer to 25 to 30 mm in depth. The graft was then placed with Orthocord sutures and was drawn up into the knee. The femoral graft was fixed with an 8 x 30 mm Milagro screw, fixed securely.   The knee was  then brought out to near extension and the tibial graft was fixed with a 9 x 30 Milagro screw. Two Orthocord sutures and the tibial graft soft tissue for reinforcement. The knee flexed well. The knee was thoroughly irrigated. The tourniquet was inflated for 38 mm during the insertion of  the Milagro screws and then deflated. The patellar donor site was filled with bone graft, and the peritenon was repaired over this, and periosteum was repaired with 3-0 Vicryl over this and the tendon.   The subcutaneous tissue was closed with 2-0 Vicryl and the skin was closed with staples. 0.5% Marcaine with epinephrine and morphine was placed in the joint. Dry sterile dressing with a Polar Care and knee immobilizer were applied. The patient had excellent circulation in the foot. He was awakened and taken to recovery in good condition.   ____________________________ Valinda HoarHoward E. Ladaja Yusupov, MD hem:cg D: 08/18/2013 19:14:25 ET T: 08/19/2013 05:01:57 ET JOB#: [id number]  cc: Valinda HoarHoward E. Donelle Baba, MD, <Dictator> Valinda HoarHOWARD E Deshia Vanderhoof MD ELECTRONICALLY SIGNED 08/19/2013 10:23
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
## 2015-02-14 NOTE — Discharge Summary (Signed)
PATIENT NAME:  Cory Floyd, Cory Floyd MR#:  [id number] DATE OF BIRTH:  1976/03/23  DATE OF ADMISSION:  04/25/2012 DATE OF DISCHARGE:  04/29/2012  ADMITTING DIAGNOSIS: Unresponsiveness, drug overdose.   DISCHARGE DIAGNOSES:  1. Intentional drug overdose with trazodone as well as likely multiple other psychiatric medications. [facility]  2. Acute respiratory failure likely due to acute encephalopathy with intubation for airway protection.  3. Acute encephalopathy due to drug overdose. Now his mental status is back to baseline.  4. Hyperglycemia, reactive.  5. Bipolar disorder with suicidal ideation and drug overdose under IVC and medically stable for transfer to Behavioral Medicine.   CONSULTANTS:  1. Erin FullingKurian Kasa, MD. 2. Greer EeWilliam Ryan, MD.  PERTINENT LABS AND EVALUATIONS: TUDS were MDMA positive.   Urinalysis was nitrite negative, leukocyte negative.   TSH 0.985. Alcohol level 0.176. LFTs were normal. BMP: Glucose 115, BUN 9, creatinine 1.08, sodium 143, potassium 3.6, chloride 108.   EKG showed sinus tachycardia with possible septal infarct.   Repeat EKG on 04/27/2012 showed normal sinus rhythm without any ST-T wave changes.   CT scan of the head showed no evidence of acute infarct or any other abnormality.   Chest x-ray showed normal cardiac silhouette. There are no infiltrates noted.   HOSPITAL COURSE: Please refer to the history and physical done by the admitting physician. The patient is a 39 year old with multiple psychiatric admissions with history of bipolar or manic depressive illness who was transferred to the ED after he was found unresponsive. Somebody had called the police and told them that he had a drug overdose. The patient was intubated because he was unresponsive and unable to protect his airway. He was kept on the ventilator. His chest x-ray showed no pulmonary pathology. The patient self-extubated himself on  04/25/2012. He has not had any further respiratory problems. He was monitored for any cardiac arrhythmias or any other pulmonary complications which the patient did not have. He was awaiting a bed to psychiatry. Currently he is stable for discharge to Behavioral Medicine.   DISCHARGE MEDICATIONS:  1. Carbamazepine 100 mg 3 tabs twice a day. 2. Olanzapine 5 mg at bedtime.  3. Tylenol 650 mg every four hours p.r.n. pain.  4. Omeprazole 20 mg daily.  HOME OXYGEN: None.   DIET: Regular.   ACTIVITY: As tolerated.   DISPOSITION: Per psychiatry once discharged from Behavioral Medicine.   TIME SPENT: 32 minutes. ____________________________ Lacie ScottsShreyang H. Allena KatzPatel, MD shp:slb D: 04/29/2012 13:41:29 ET T: 04/30/2012 10:35:47 ET JOB#: [id number]  cc: Cameren Odwyer H. Allena KatzPatel, MD, <Dictator> Charise CarwinSHREYANG H Shiloh Swopes MD ELECTRONICALLY SIGNED 05/03/2012 17:50

## 2015-02-14 NOTE — Consult Note (Signed)
PATIENT NAME:  Cory Floyd, Cory Floyd MR#:  [id number] DATE OF BIRTH:  08-05-1976  DATE OF CONSULTATION:  04/25/2012  REFERRING PHYSICIAN:  Dory Larsen, MD CONSULTING PHYSICIAN:  Venida Jarvis, MD  REASON FOR CONSULTATION: Overdose.  CHIEF COMPLAINT: Overdose, none from the patient.   HISTORY OF PRESENT ILLNESS: The patient is a 39 year old Hispanic male with a history of bipolar disorder and alcohol dependence. He was transported to the ER unresponsive after a phone call saying that he had a drug overdose. He was unresponsive and intubated by the Emergency Department for airway protection as well as respiratory failure. He was noted to have a high alcohol level. Further history released from the family revealed that he had some empty bottles of Zyprexa and Tegretol. Recently discharged from psychiatry about a month ago with two admissions back to back.   History by me is obtained from talking with the psychiatric nurses, talking with the Intensive Care Unit nurses, talking with Dr. Maryruth Bun, revealing his last two psychiatric hospitalizations, the history and physical and progress note done by his attending, and the medical director of the Intensive Care Unit.   Again, history of noncompliance, bipolar disorder, personality disorder, et Karie Soda. He was last hospitalized again about a month ago. During his first hospitalization he was quite agitated on the unit but responded at that time to Tegretol and Risperdal. Admitted two days later following a suicide attempt again, at one point in time he was jumping out of a car but also he was drinking at the same time and another point in time overdosed. The next hospitalization he had pressured speech and flight of ideas and agitation, hitting walls on the unit and responded to Tegretol at 300 b.i.d. and Zyprexa perhaps 10 at bedtime.   Also at the last hospitalization was supposed to go directly to [place]. Follow-up there and also because he was on  probation there. He apparently wanted to go but even though he had a bus ticket wound up not going in some way.   MEDICATIONS ON ADMISSION: Tegretol 300 b.i.d. and Zyprexa 10 mg at bedtime. He may not have been compliant, he has a history of not being compliant.   FAMILY HISTORY: An aunt with bipolar disorder and a cousin committed suicide.   PAST MEDICAL HISTORY: Unremarkable.   REVIEW OF SYSTEMS: Unobtainable.   SOCIAL HISTORY: He is unemployed and divorced. Graduated from high school. He formerly worked for a [employer] and does some odd jobs at present. He is married and divorced for many years, two children whom he does not see. He does not pay any child support. He does drink an undetermined amount of alcohol and on admission here had a fairly high alcohol blood level in addition to the suspected overdose. He has had one previous DWI and also is a registered sex offender which from past history apparently was secondary to some alcohol intoxication.   MENTAL STATUS EXAMINATION: The patient currently is comatose with the patient being placed on Precedex so further history from the patient cannot be obtained and a through mental status cannot be done.   IMPRESSION:  AXIS I:  1. Bipolar mixed. 2. Alcohol dependence.   AXIS Floyd: Personality disorder, not otherwise specified.   AXIS III: Status post overdose, currently in critical medical condition.   AXIS IV: No full-time job and also a sex offender.   AXIS V: 20. The patient apparently had a suicide attempt of which was more than likely impulsive.  ASSESSMENT AND PLAN: The patient may need psychiatric hospitalization. [facility]. We have had trouble with him hitting walls on the unit and floor so he probably may well be almost too violent for us at this point. Any way, I will continue to follow-up work on disposition.     Would hold Tegretol and Zyprexa at present, but  would get a Tegretol blood level.    ____________________________ Venida JarvisWilliam James Quincy Prisco II, MD wjr:rbg D: 04/25/2012 16:59:20 ET T: 04/26/2012 11:11:40 ET JOB#: [id number]  cc: Venida JarvisWilliam James Cory Eltringham II, MD, <Dictator> Jules HusbandsWILLIAM J Taraya Steward MD ELECTRONICALLY SIGNED 04/26/2012 12:32

## 2015-02-14 NOTE — Discharge Summary (Signed)
PATIENT NAME:  Cory Floyd, Cory Floyd MR#:  [id number] DATE OF BIRTH:  1976-09-09  DATE OF ADMISSION:  04/29/2012 DATE OF DISCHARGE:  05/02/2012  HISTORY OF PRESENT ILLNESS: The patient was admitted with bipolar depressed. For further details, please see typed attached history and physical.   ACCESSORY CLINICAL DATA: None on psychiatry. Please see accessory clinical data on medicine.   HOSPITAL COURSE: Further history from the patient revealed that he tended to occasionally binge drink with alcohol and it was with those times that he tended to lose more control and also tended to get depressed and he had suicide attempts at the time. During the course of the hospitalization, he did not appear to be depressed. He went back on his medicines of Zyprexa and Tegretol. On the next to last day of hospitalization he did become slightly agitated at times over various things, but did not further act out.  He also learned he probably has some legal charges awaiting him after he leaves the hospital, however, we did make a referral to [facility] and the patient reported that he would go. He was no longer committable at the time of discharge having maintained for several days. He was not suicidal. However, his long-term risk is probably high, particularly if he continues to use alcohol.   DIAGNOSES:   AXIS I:  1. Bipolar disorder, mixed. 2. Alcohol dependence.   AXIS II: Personality disorder.   AXIS III: Overdose on trazodone.   CONDITION ON DISCHARGE: Fair to good.      DISPOSITION: The patient will be followed up at mental health.  Hopefully he will go to [facility]. A bed apparently is available on 05/03/2012.  Diet and activity as tolerated.  ____________________________ Venida JarvisWilliam James Ryan II, MD wjr:slb D: 05/01/2012 15:03:00 ET T: 05/02/2012 10:16:13 ET JOB#: [id number]  cc: Venida JarvisWilliam James Ryan II, MD, <Dictator>  Jules HusbandsWILLIAM J RYAN MD ELECTRONICALLY SIGNED 05/03/2012 12:15

## 2015-02-14 NOTE — Discharge Summary (Signed)
PATIENT NAME:  Cory Floyd, Cory Floyd MR#:  [id number] DATE OF BIRTH:  02-03-1976  DATE OF ADMISSION:  03/27/2012 DATE OF DISCHARGE:  03/29/2012  HISTORY OF PRESENT ILLNESS: [name] is a 39 year old male admitted to the Inpatient Behavioral Health Unit of [facility] on 03/27/2012 under involuntary commitment.   He had jumped out of a moving car and had voiced suicidal ideation. He described great stress. He had come up positive on urine drug screen and was already under probation. He believed that this meant he was going to serve a jail term. When he received the news of the drug screen, he became very agitated. His mother was driving the car and had reduced the speed down to almost a stop and he jumped out. However, his speech was very pressured. His thoughts were racing, and he was displaying impaired judgment. She petitioned for his involuntary commitment.   He did have a blood alcohol level that was still 185 by the time it was taken in the Emergency Room. However, it was determined that the alcohol was consumed after the impulsive activity and emergence of agitation with racing thoughts. Please see the admission dictation.   ANCILLARY CLINICAL DATA: None.   HOSPITAL COURSE: [name] was admitted to the Inpatient Behavioral Health Unit and underwent milieu and group psychotherapy. He did demonstrate a resolution of his racing thoughts and pressured speech. His mood became normal. He was prescribed Tegretol 200 mg b.i.d. as a mood stabilizer and her Risperdal titrated to 3 mg at bedtime as an acute mood stabilizer. He tolerated these well without adverse effect.   CONDITION ON DISCHARGE: By June 7th, [name] was demonstrating normal social behavior consistently with normal thought speed, normal energy, and no agitation. He was describing constructive interests and goals. He was not showing any hallucinations or delusions. His orientation and memory function were intact. He  was not having any adverse medication effects. He was requesting discharge and was no longer committable.   The undersigned discussed with [name] that he could benefit from further milieu and group psychotherapy for coping skills and stress management, however, as mentioned above, he was no longer committable.   MENTAL STATUS EXAM UPON DISCHARGE: [name] is alert. His eye contact is good. Concentration is normal. He is oriented to all spheres. His memory is intact to immediate, recent, and remote. Fund of knowledge, use of language and intelligence are normal. Speech involves normal rate and prosody without dysarthria. Thought process is logical, coherent, and goal directed. No looseness of associations or tangents. Thought content: No thoughts of harming himself or others. No delusions or hallucinations. Insight is intact. Judgment is intact. Affect is broad and appropriate. Mood within normal limits.   DISCHARGE DIAGNOSES:  AXIS I:  1. Bipolar disorder, not otherwise specified.  2. Alcohol abuse.   AXIS II: Deferred.   AXIS III: None.   AXIS IV: Legal, primary support group.   AXIS V: 55.   [name] is not at risk to harm himself or others. He agrees to call Emergency Services immediately for any thoughts of harming himself, thoughts of harming others, or distress.   DIET: Regular.   ACTIVITY: Routine.   FOLLOWUP:  Outpatient follow will be at [facility]  June 10th at 1:00 p.m.   DISCHARGE MEDICATIONS:  1. Risperdal 3 mg at bedtime. 2. Tegretol 2 mg b.i.d. He is to have the Tegretol level taken at 10 days after starting the Tegretol at a 12-hour  trough level. He is also to have the typical screening labs for Tegretol, a CBC and liver function tests at his follow-up facility.   AA meetings and a 12-step sponsor would be very important for him, and this has been emphasized.    ____________________________ Adelene AmasJames S. Tyiana Hill, MD jsw:cbb D: 04/11/2012 09:57:00  ET T: 04/11/2012 10:08:58 ET JOB#: [id number]  cc: Adelene AmasJames S. Jackye Dever, MD, <Dictator> Lester CarolinaJAMES S Nil Bolser MD ELECTRONICALLY SIGNED 04/15/2012 23:52

## 2015-02-14 NOTE — H&P (Signed)
PATIENT NAME:  Cory Floyd, Cory Floyd DATE OF BIRTH:  20-Jul-1976  DATE OF ADMISSION:  11/21/2011  REFERRING PHYSICIAN: Dr. Janalyn Harderavid Kaminski   ATTENDING PHYSICIAN: Dr. Kristine LineaJolanta Porter Nakama   IDENTIFYING DATA: [name] is a 39 year old male with a history of bipolar disorder.   CHIEF COMPLAINT: "I needed a break."   HISTORY OF PRESENT ILLNESS: [name] was hospitalized at [facility] in the middle of December. He was discharged on a combination of Celexa, trazodone and Risperdal. He reports being compliant with medications. He, however, started arguing with his mother, who does not believe that he has real issues, and decided to come to the hospital for a break. He reports that in spite of taking medication he developed command auditory hallucinations telling him to kill himself. He has not developed a plan and has been able to resist the voices. He denies decreased sleep, increased appetite, anhedonia, feeling of guilt, hopelessness, worthlessness, social isolation, poor memory and concentration, with low energy level. He became distraught as the hallucinations developed. He denies substance abuse. He denies symptoms suggestive of bipolar mania. There is no heightened anxiety,   PAST PSYCHIATRIC HISTORY: The patient has multiple hospitalizations and multiple suicide attempts. He was hospitalized four times within the last 12 months. He follows up with [facility]. He has been tried on numerous other medications including lithium, Depakote and Seroquel, as well as [place] Sustenna.  He is happy with the current regimen of medications. He reports that the suicidal thoughts increase when he is drinking. He reports drinking a couple of beers a week. He doesn't believe that there is a problem. He denies illicit substance or prescription pill misuse.   FAMILY PSYCHIATRIC HISTORY: He has an aunt with bipolar, and his uncle committed suicide.   PAST MEDICAL HISTORY: None.    ALLERGIES: Penicillin.   MEDICATIONS ON ADMISSION:  1. Risperdal 3 mg at night. 2. Celexa 20 mg daily.  3. Trazodone 100 mg at night.   SOCIAL HISTORY: He lives with his mother. He is unemployed and applied for Disability. He was denied once. He is a smoker. He has a history of arrests for indecent liberties with a child and DUIs. He is on probation. No charges pending.    REVIEW OF SYSTEMS: CONSTITUTIONAL: No fevers or chills. Positive for some weight gain. EYES: No double or blurred vision. ENT: No hearing loss. RESPIRATORY: No shortness of breath or cough. CARDIOVASCULAR: No chest pain or orthopnea. GASTROINTESTINAL: No abdominal pain, nausea, vomiting, or diarrhea. Normal bowel movements. GU: No incontinence or frequency. ENDOCRINE: No heat or cold intolerance. LYMPHATIC: No anemia or easy bruising. INTEGUMENTARY: No acne or rash. MUSCULOSKELETAL: No muscle or joint pain. NEUROLOGIC: No tingling or weakness. PSYCHIATRIC: See history of present illness for details.   PHYSICAL EXAMINATION:  VITAL SIGNS: Blood pressure 132/78, pulse 76, respirations 18, temperature 98.3.   GENERAL: This is an obese male in no acute distress.   HEENT: The pupils are equal, round, and reactive to light.   NECK: Supple. No thyromegaly.   LUNGS: Clear to auscultation. No dullness to percussion.   HEART: Regular rhythm and rate. No murmurs, rubs, or gallops.   ABDOMEN: Soft, nontender, nondistended. Positive bowel sounds.   MUSCULOSKELETAL: Normal muscle strength in all extremities.   SKIN: No rashes or bruises.   LYMPHATIC: No cervical adenopathy.   NEUROLOGIC: Cranial nerves II through XII are intact.   LABORATORY, DIAGNOSTIC AND RADIOLOGICAL DATA:  Chemistries are within normal limits.  Blood alcohol level is 0.063.  LFTs are within normal limits except for AST of 55, ALT 79. TSH 4.39.  Urine toxicology screen positive for MDMA.  CBC within normal limits.  Urinalysis is not suggestive of  urinary tract infection.  Serum acetaminophen less than 2.0.  Serum salicylates 3.5.   MENTAL STATUS EXAMINATION ON ADMISSION: The patient is asleep in bed but easily arousable. He is oriented to person, place, time, and situation. He is pleasant, polite, and cooperative. He is wearing private clothes. Grooming is adequate. He maintains good eye contact. His speech is soft. Mood is depressed with flat affect. Thought processing is logical and goal oriented. Thought content: He denies suicidal or homicidal ideation but was admitted after threatening suicide in response to auditory command hallucinations. There are no delusions or paranoia, positive for hallucinations. His cognition is grossly intact. He registers three out of three and recalls three out of three objects after five minutes. He can spell world forward and backward. He refuses to count. He knows the current Economist. Abstraction is intact. His insight and judgment are fair.   SUICIDE RISK ASSESSMENT ON ADMISSION: This is a patient with a history of psychosis and mood instability who came to the hospital with command hallucinations in spite of good treatment compliance,   ASSESSMENT:  AXIS I:  1. Bipolar affective disorder, not otherwise specified.  2. Alcohol abuse.   AXIS II: Deferred.   AXIS III: Obesity.   AXIS IV: Mental illness, substance abuse, primary support, employment, financial, conflict with the mother.   AXIS V: Global Assessment of Functioning score on admission 25.   PLAN: The patient was admitted to [facility] Unit for safety, stabilization, and medication management. He was initially placed on suicide precautions and was closely monitored for any unsafe behaviors. He underwent full psychiatric and risk assessment. He received pharmacotherapy, individual and group psychotherapy, substance abuse counseling, and support from therapeutic milieu.   1. Suicidality: This has  resolved. The patient is able to contract for safety.  2. Mood psychosis: He does not wish medication changes. We will continue Celexa, trazodone and Risperdal.  3. Substance abuse: He minimizes and is not interested in participation in alcohol rehab.  4. Disposition: He will be discharged to home.  ____________________________ Ellin Goodie. Jennet Maduro, MD jbp:cbb D: 11/21/2011 16:16:53 ET T: 11/21/2011 17:05:35 ET JOB#: [id number]  cc: Yamen Castrogiovanni B. Jennet Maduro, MD, <Dictator> Shari Prows MD ELECTRONICALLY SIGNED 11/22/2011 8:40

## 2015-02-14 NOTE — H&P (Signed)
PATIENT NAME:  Cory Floyd, Cory Floyd MR#:  [id number] DATE OF BIRTH:  19-Sep-1976  DATE OF ADMISSION:  03/27/2012  CHIEF COMPLAINT AND IDENTIFYING DATA: Cory Floyd is a 39 year old single male presenting to the Emergency Department under involuntary commitment after voicing suicidal ideation and jumping out of a car.   HISTORY OF PRESENT ILLNESS: Cory Floyd has been under great stress. He had another positive urine drug screen and was already under probation. With this urine drug screen violation, he states that he will have to serve a jail term.   Yesterday after he received the news of his drug screen he was very agitated. He was expressing suicidal ideation. When his mother was driving the patient in a car, he jumped out of the car before she had stopped it and was behaving in a very impulsive manner. His speech has been pressured. His thoughts have been racing. He has resisted coming for psychiatric evaluation, therefore, he was placed under involuntary commitment.   He is cooperative with the undersigned's exam. He is oriented to all spheres. His memory function is intact. He is not combative.   He did have a blood alcohol that was still at 185 by the time it was taken in the Emergency Room. However, the alcohol was consumed after the period of severe agitation, reporting suicidal ideation, and impulsive activity such as jumping out of a car.   PAST PSYCHIATRIC HISTORY: Cory Floyd reports that he has been tried on Depakote increased to a dosage of 2500 mg per day. He states that that did not help with his mood. He has been on Risperdal in the past increased to a dosage of 3 mg at bedtime.    Cory Floyd was admitted to the Inpatient Behavioral Health Unit in January of this year. At that time he was noted to have bipolar disorder. It was noted by the psychiatrist that his mother at that time was not believing that he had a psychiatric condition. He had been noncompliant with medication. He developed  command auditory hallucinations telling him to kill himself.   He has a history of poor insight into his psychiatric condition and denial. Other medications tried in the past have included [place] Sustenna, Depakote, Seroquel, and lithium.   FAMILY PSYCHIATRIC HISTORY: Uncle committed suicide. He has an aunt with bipolar disorder.   SOCIAL HISTORY: He has been residing with his mother. She provides social support and advocates for the patient getting his life under control, however, there has been a history of her contributing to his lack of insight and denial as noted above.   Cory Floyd is unemployed. He has no SSI.   He is a registered sexual offender. This offense took place while he was under the influence of alcohol. Please see the history above.   PAST MEDICAL HISTORY: Usual childhood illnesses.   ALLERGIES: Penicillin.  MEDICATIONS: Medications are supposed to be Risperdal 3 mg at bedtime and Celexa 20 mg daily. He has been noncompliant.   LABORATORY DATA: SGPT, urine drug screen, TSH, SGOT are all unremarkable.    REVIEW OF SYSTEMS: Constitutional, HEENT, mouth, neurologic, psychiatric, cardiovascular, respiratory, gastrointestinal, genitourinary, skin, musculoskeletal, hematologic, lymphatic, endocrine, metabolic all unremarkable.   PHYSICAL EXAMINATION:   VITAL SIGNS: Temperature 97.9, pulse 113, respiratory rate 20, blood pressure 105/61.   GENERAL APPEARANCE: Cory Floyd is a well developed, well nourished young male appearing his chronologic age sitting up in his hospital bed with no abnormal involuntary movements. He has no cachexia. His muscle  tone is normal. His grooming is mildly disheveled. Hygiene is normal.   HEENT: Head normocephalic, atraumatic. Pupils equally round and reactive to light and accommodation. Oropharynx clear without erythema.   EXTREMITIES: No cyanosis, clubbing, or edema.   SKIN: Normal turgor. No rashes.   RESPIRATORY: Lungs clear to  auscultation. No wheezing, rhonchi, or rales.   CARDIOVASCULAR: Regular rate and rhythm. No murmurs, rubs, or gallops.   ABDOMEN: Nondistended. Bowel sounds positive. Soft, nontender. No masses.   GENITOURINARY: Deferred.   NEUROLOGIC: Cranial nerves II through XII intact. General sensory intact throughout to light touch. Motor 5/5 strength throughout. Deep tendon reflexes normal strength and symmetry throughout. No Babinski. Coordination intact bilaterally by finger-to-nose.   MENTAL STATUS EXAMINATION: Cory Floyd is alert. His eye contact is good. Concentration is mildly decreased. He is oriented to all spheres. Memory is intact to immediate, recent, and remote. Fund of knowledge, intelligence, and use of language are normal. Speech is pressured with increased rate. There is no dysarthria. Thought process increased but overall coherent. There are some tangents. Thought content he is denying any thoughts of harming himself or others. He is denying any hallucinations or delusions. His insight is poor. Judgment is impaired. Affect is expansive. Mood is agitated.   ASSESSMENT:  AXIS I:  1. Bipolar I disorder, mixed.  2. Alcohol dependence.   AXIS II: Deferred.   AXIS III: Usual childhood illnesses.   AXIS IV: Legal, primary support group.   AXIS V: 30.   Cory Floyd is at risk for impulsive self neglect as well as potential self harm outside of the supportive environment of the hospital.   He is under involuntary commitment.   PLAN:  1. Therefore, will admit Cory Floyd to the Inpatient Behavioral Health Unit for further evaluation and treatment.  2. Will start Tegretol 200 mg p.o. b.i.d. Cory Floyd states that he has no history of being tried on that for mood stabilization.  3. Will restart Risperdal at 2 mg at bedtime to provide anti-psychosis and augmentation of Tegretol for mood stabilization given his history of being refractory to other single mood stabilization trials.   4. Will provide 0.5 mg at bedtime of Cogentin for anti-EPS. 5. He will undergo milieu and group psychotherapy.    ____________________________ Adelene Amas. Aletha Allebach, MD jsw:drc D: 03/27/2012 23:57:17 ET T: 03/28/2012 06:37:34 ET JOB#: [id number]  cc: Adelene Amas. Chandi Nicklin, MD, <Dictator> L[address] [address] MD ELECTRONICALLY SIGNED 03/28/2012 17:06

## 2015-02-14 NOTE — H&P (Signed)
PATIENT NAME:  Cory Floyd, BRANNEN MR#:  [id number] DATE OF BIRTH:  December 03, 1975  DATE OF ADMISSION:  04/25/2012  PRIMARY CARE PHYSICIAN: Unknown.   CHIEF COMPLAINT: Drug overdose, and the patient is unresponsive.   HISTORY OF PRESENT ILLNESS: Cory Floyd is a 39 year old Hispanic male with a history of bipolar manic depressive illness. The patient was transported to the Emergency Department unresponsive after a phone call from somebody to the police that he had drug overdose. He was brought here with papers to have involuntary commitment. The patient is unresponsive and intubated by the Emergency Department physician for airway protection. It is unclear what the patient took or overdosed himself. He received naloxone or Narcan, but he did not respond to that. The patient is to be admitted to the Intensive Care Unit for further evaluation and monitoring.   REVIEW OF SYSTEMS: Review of systems is unobtainable due to the patient's unresponsiveness.   PAST MEDICAL HISTORY:  1. Bipolar manic depressive illness, followed by Psychiatry.  2. Prior history of suicidal thoughts.   ADMISSION MEDICATIONS:  1. Trileptal 150 mg b.i.d.  2. Trazodone 100 mg b.i.d.   3. Olanzapine 10 mg.   ALLERGIES: Penicillin.   SOCIAL HISTORY: According to records, he is unemployed and divorced. He graduated from high school. He has two children.   PAST PSYCHIATRIC HISTORY: In addition to the bipolar depression, the patient also is a sex offender, and he is  supposed to go to [place] to do his time there.   FAMILY HISTORY: He has an aunt who has bipolar disorder and a cousin who committed suicide.   SOCIAL HABITS: He drinks alcohol, the amount is unknown.  He also smokes cigarettes, according to the medical records about 2 packs a day for many years.   ALLERGIES: Penicillin.   PHYSICAL EXAMINATION:  VITAL SIGNS: Blood pressure 145/65, respiratory rate 18, pulse 100, and oxygen saturation 100% while the patient is on the  ventilator.   GENERAL APPEARANCE: A young male, sedated on the ventilator.   HEENT: Head: On examination, no pallor, no icterus, no cyanosis. Ears, nose and throat: Exam is limited due to the patient's sedation, responsiveness and on the ventilator and intubated. Eyes: Examination revealed normal iris and conjunctivae. Pupils are about 4 to 5 mm, sluggishly reactive to light.   NECK: Supple. Trachea at midline. No thyromegaly. No cervical masses.   HEART: Exam revealed normal S1, S2. No S3, S4. No murmur. No gallop. No carotid bruits.   RESPIRATORY: On examination, the patient is on a ventilator, and he is breathing totally dependent on the respirator. No rales. No wheezing.   ABDOMEN: Soft without rigidity. No masses. No hepatosplenomegaly. No hernias.   SKIN: Examination revealed no ulcers, no subcutaneous nodules. There is a superficial linear skin laceration of the right leg midway between the knee and the ankle   MUSCULOSKELETAL: No joint swelling. No clubbing.   NEUROLOGIC: Exam is limited due to the patient's unresponsiveness and attached to a ventilator. There is no facial asymmetry. Pupils are about 4 mm, equal and reactive to light.   PSYCHIATRIC: Unobtainable due to patient's unresponsiveness.    LABORATORY, DIAGNOSTIC AND RADIOLOGICAL DATA:  EKG showed sinus tachycardia at a rate of 128 per minute. Incomplete right bundle branch block, otherwise unremarkable EKG. Serum glucose 115, BUN 9, creatinine 1.08, sodium 143, potassium 3.6, calcium 8.1, albumin 3.7.  Alcohol level was 176.  Liver function tests were normal.  Thyroid stimulating hormone or TSH was normal at 0.9.  Drug screen was positive for MDMA consistent with Ecstasy abuse.  His CBC showed a white count of 7000, hemoglobin 13, hematocrit 39, platelet count 192. Urinalysis was unremarkable.  Arterial blood gas showed a pH of 7.39, pCO2 40, pO2 161 while on the ventilator with a tidal volume of 550 and FiO2 of 40%.    ASSESSMENT:  1. Drug overdose.  2. Unresponsiveness secondary to drug overdose.  3. Patient was intubated for airway protection and attached to a ventilator.  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 5. Bipolar disorder. 6. Suicidal attempt.   PLAN:  1. The patient will be managed  on the ventilator along with IV sedation.  2. Admit to the Intensive Care Unit.  3. Monitor for any arrhythmias and any change in the EKG and QRS duration.  4. Repeat EKG in the morning.  5. Psychiatric consultation.   TIME SPENT EVALUATING THIS PATIENT: More than 55 minutes.  ____________________________ Carney CornersAmir M. Rudene Rearwish, MD amd:cbb D: 04/25/2012 02:40:48 ET T: 04/25/2012 14:35:55 ET JOB#: [id number]  cc: Carney CornersAmir M. Rudene Rearwish, MD, <Dictator> Zollie ScaleAMIR M Sanii Kukla MD ELECTRONICALLY SIGNED 04/27/2012 22:14

## 2016-05-18 ENCOUNTER — Ambulatory Visit: Payer: Self-pay | Admitting: Family Medicine

## 2016-05-18 VITALS — BP 109/75 | HR 97 | Wt 275.0 lb

## 2016-05-18 DIAGNOSIS — R7303 Prediabetes: Secondary | ICD-10-CM

## 2016-05-18 LAB — GLUCOSE, POCT (MANUAL RESULT ENTRY): POC Glucose: 243 mg/dl — AB (ref 70–99)

## 2016-05-18 NOTE — Patient Instructions (Signed)
Referral to dental care

## 2016-05-18 NOTE — Progress Notes (Signed)
Referral to dental care  

## 2016-05-18 NOTE — Progress Notes (Signed)
   Subjective:    Patient ID: Cory Floyd, male    DOB: October 26, 1975, 40 y.o.   MRN: [id number]  HPI Patient currently smokes.  There are no active problems to display for this patient. Patient has no complaints. He just wishes referral to a dentist    Medication List       Accurate as of 05/18/16  6:36 PM. Always use your most recent med list.          clonazePAM 0.5 MG tablet Commonly known as:  KLONOPIN Take 0.5 mg by mouth 2 (two) times daily as needed. For anxiety.   clonazePAM 0.5 MG tablet Commonly known as:  KLONOPIN Take 1 tablet (0.5 mg total) by mouth at bedtime as needed for anxiety.   risperiDONE 3 MG tablet Commonly known as:  RISPERDAL Take 3 mg by mouth 2 (two) times daily.   risperiDONE 3 MG tablet Commonly known as:  RISPERDAL Take 1 tablet (3 mg total) by mouth 2 (two) times daily.   traZODone 100 MG tablet Commonly known as:  DESYREL Take 100 mg by mouth at bedtime.   traZODone 100 MG tablet Commonly known as:  DESYREL Take 1 tablet (100 mg total) by mouth at bedtime.         Review of Systems  Constitutional: Negative.   Respiratory: Negative.   Cardiovascular: Negative.   Psychiatric/Behavioral: Negative.    BP 109/75   Pulse 97   Wt 275 lb (124.7 kg)      Objective:   Physical Exam  Constitutional: He is oriented to person, place, and time. He appears well-developed and well-nourished.  HENT:  Head: Normocephalic and atraumatic.  Poor dentition noted.  Eyes: Conjunctivae are normal.  Neck: No thyromegaly present.  Cardiovascular: Normal rate, regular rhythm and normal heart sounds.   Pulmonary/Chest: Effort normal and breath sounds normal.  Abdominal: Soft.  Lymphadenopathy:    He has no cervical adenopathy.  Neurological: He is alert and oriented to person, place, and time.  Skin: Skin is warm and dry.  Psychiatric: He has a normal mood and affect. His behavior is normal. Judgment and thought content normal.          Assessment & Plan:  Referral to dental care

## 2016-08-09 ENCOUNTER — Other Ambulatory Visit: Payer: Self-pay | Admitting: Internal Medicine

## 2016-08-09 DIAGNOSIS — R748 Abnormal levels of other serum enzymes: Secondary | ICD-10-CM

## 2016-08-14 ENCOUNTER — Ambulatory Visit: Payer: Medicare Other

## 2016-08-15 DIAGNOSIS — Z6841 Body Mass Index (BMI) 40.0 and over, adult: Secondary | ICD-10-CM

## 2016-08-15 DIAGNOSIS — I208 Other forms of angina pectoris: Secondary | ICD-10-CM | POA: Insufficient documentation

## 2016-09-21 DIAGNOSIS — K219 Gastro-esophageal reflux disease without esophagitis: Secondary | ICD-10-CM | POA: Insufficient documentation

## 2016-09-21 DIAGNOSIS — G4733 Obstructive sleep apnea (adult) (pediatric): Secondary | ICD-10-CM | POA: Insufficient documentation

## 2016-09-29 ENCOUNTER — Other Ambulatory Visit: Payer: Self-pay | Admitting: Surgical Oncology

## 2016-09-29 DIAGNOSIS — K219 Gastro-esophageal reflux disease without esophagitis: Secondary | ICD-10-CM

## 2016-10-10 ENCOUNTER — Ambulatory Visit
Admission: RE | Admit: 2016-10-10 | Discharge: 2016-10-10 | Disposition: A | Discharge status: Home / Self Care | Payer: Medicare Other | Source: Ambulatory Visit | Attending: Surgical Oncology | Admitting: Surgical Oncology

## 2016-10-10 DIAGNOSIS — K219 Gastro-esophageal reflux disease without esophagitis: Secondary | ICD-10-CM

## 2016-11-14 ENCOUNTER — Telehealth: Payer: Self-pay | Admitting: Gastroenterology

## 2016-11-14 NOTE — Telephone Encounter (Signed)
Left voice message for patient to call and schedule appointment with GI for loose stools x 2-3 months. Referred by [facility]

## 2016-11-23 ENCOUNTER — Ambulatory Visit: Payer: Self-pay | Admitting: Gastroenterology

## 2016-12-04 ENCOUNTER — Other Ambulatory Visit: Payer: Self-pay

## 2016-12-04 ENCOUNTER — Ambulatory Visit (INDEPENDENT_AMBULATORY_CARE_PROVIDER_SITE_OTHER): Payer: Medicare Other | Admitting: Gastroenterology

## 2016-12-04 ENCOUNTER — Encounter: Payer: Self-pay | Admitting: Gastroenterology

## 2016-12-04 VITALS — BP 138/69 | HR 103 | Temp 98.7°F | Ht 70.0 in | Wt 296.0 lb

## 2016-12-04 DIAGNOSIS — K580 Irritable bowel syndrome with diarrhea: Secondary | ICD-10-CM

## 2016-12-04 NOTE — Progress Notes (Signed)
Gastroenterology Consultation  Referring Provider:     Dr. Judie Grieve Primary Care Physician:  No primary care provider on file. Primary Gastroenterologist:  Dr. Servando Snare     Reason for Consultation:     Diarrhea        HPI:   Cory Floyd is a 41 y.o. y/o male referred for consultation & management of Diarrhea by Dr. Bonnetta Barry primary care provider on file..  The patient comes today with a report of having diarrhea within 30 minutes of eating.  The patient states he only has diarrhea once to twice a day because he only eats 2 meals a day.  The patient has not had any unexplained weight loss and is obese.  There is no report of any black stools or bloody stools.  The patient had a colonoscopy in the past that showed him to have noncancerous polyps.  The patient also reports that his loose bowel movements are readily treatable by taking Imodium.  The patient was told to try probiotics but he states he could not afford the align.  There is no report of any family history of colon cancer or colon polyps.  The patient also denies that his bowel movements wake him up from his sleep.  Past Medical History:  Diagnosis Date  . Bipolar 1 disorder (HCC)   . Depressed     Past Surgical History:  Procedure Laterality Date  . COLONOSCOPY  02/05/2014   diverticulosis, hyperplastic polyp x 2  . KNEE SURGERY Right 2013    Prior to Admission medications   Medication Sig Start Date End Date Taking? Authorizing Provider  acetaminophen (TYLENOL) 500 MG tablet Take 1,000 mg by mouth daily as needed for headache.   Yes Historical Provider, MD  ARIPiprazole (ABILIFY) 15 MG tablet Take 15 mg by mouth at bedtime. 11/13/16  Yes Historical Provider, MD  divalproex (DEPAKOTE ER) 500 MG 24 hr tablet Take 1,500 mg by mouth at bedtime. TAKES 3 CAPSULES AT BEDTIME 08/10/16  Yes Historical Provider, MD  propranolol (INDERAL) 20 MG tablet Take 40 mg by mouth daily. 11/13/16  Yes Historical Provider, MD  traZODone  (DESYREL) 100 MG tablet Take 200 mg by mouth at bedtime.    Yes Historical Provider, MD    Family History  Problem Relation Age of Onset  . Hepatitis C Mother   . Cancer Maternal Aunt   . Hypertension Maternal Grandmother      Social History  Substance Use Topics  . Smoking status: Current Every Day Smoker    Packs/day: 1.00    Types: Cigarettes  . Smokeless tobacco: Never Used  . Alcohol use No    Allergies as of 12/04/2016 - Review Complete 12/04/2016  Allergen Reaction Noted  . Carrot [daucus carota] Anaphylaxis and Rash 12/01/2016  . Penicillins Anaphylaxis and Hives 02/01/2012    Review of Systems:    All systems reviewed and negative except where noted in HPI.   Physical Exam:  BP 138/69   Pulse (!) 103   Temp 98.7 F (37.1 C) (Oral)   Ht 5\' 10"  (1.778 m)   Wt 296 lb (134.3 kg)   BMI 42.47 kg/m  No LMP for male patient. Psych:  Alert and cooperative. Normal mood and affect. General:   Alert,  Well-developed, well-nourished, pleasant and cooperative in NAD Head:  Normocephalic and atraumatic. Eyes:  Sclera clear, no icterus.   Conjunctiva pink. Ears:  Normal auditory acuity. Nose:  No deformity, discharge, or lesions. Mouth:  No  deformity or lesions,oropharynx pink & moist. Neck:  Supple; no masses or thyromegaly. Lungs:  Respirations even and unlabored.  Clear throughout to auscultation.   No wheezes, crackles, or rhonchi. No acute distress. Heart:  Regular rate and rhythm; no murmurs, clicks, rubs, or gallops. Abdomen:  Normal bowel sounds.  No bruits.  Soft, non-tender and non-distended without masses, hepatosplenomegaly or hernias noted.  No guarding or rebound tenderness.  Negative Carnett sign.   Rectal:  Deferred.  Msk:  Symmetrical without gross deformities.  Good, equal movement & strength bilaterally. Pulses:  Normal pulses noted. Extremities:  No clubbing or edema.  No cyanosis. Neurologic:  Alert and oriented x3;  grossly normal  neurologically. Skin:  Intact without significant lesions or rashes.  No jaundice. Lymph Nodes:  No significant cervical adenopathy. Psych:  Alert and cooperative. Normal mood and affect.  Imaging Studies: No results found.  Assessment and Plan:   Cory Floyd is a 41 y.o. y/o male Who comes in today with irritable bowel syndrome symptoms with postprandial diarrhea.  The patient has had no Lori symptoms such as weight loss, family history of colon cancer, black stools or bloody stools.  He also denies that the diarrhea wakes him up from sleep.  It is also easily treatable with Imodium.  The patient has been told to take Imodium for his diarrhea when he has it and he has also been told to increase fiber in his diet and has been given samples of Metamucil to try at home.  The patient has been explained the plan agrees with it.    Cory Miniumarren Miyana Mordecai, MD. Clementeen GrahamFACG   Note: This dictation was prepared with Dragon dictation along with smaller phrase technology. Any transcriptional errors that result from this process are unintentional.

## 2016-12-07 ENCOUNTER — Encounter
Admission: RE | Admit: 2016-12-07 | Discharge: 2016-12-07 | Disposition: A | Discharge status: Home / Self Care | Payer: Medicare Other | Source: Ambulatory Visit | Attending: Unknown Physician Specialty | Admitting: Unknown Physician Specialty

## 2016-12-07 DIAGNOSIS — Z01818 Encounter for other preprocedural examination: Secondary | ICD-10-CM | POA: Diagnosis not present

## 2016-12-07 DIAGNOSIS — Z01812 Encounter for preprocedural laboratory examination: Secondary | ICD-10-CM | POA: Diagnosis not present

## 2016-12-07 DIAGNOSIS — Z01810 Encounter for preprocedural cardiovascular examination: Secondary | ICD-10-CM | POA: Insufficient documentation

## 2016-12-07 HISTORY — DX: Dyspnea, unspecified: R06.00

## 2016-12-07 HISTORY — DX: Unspecified osteoarthritis, unspecified site: M19.90

## 2016-12-07 HISTORY — DX: Schizophrenia, unspecified: F20.9

## 2016-12-07 HISTORY — DX: Alcohol dependence, uncomplicated: F10.20

## 2016-12-07 HISTORY — DX: Fatty (change of) liver, not elsewhere classified: K76.0

## 2016-12-07 HISTORY — DX: Angina pectoris, unspecified: I20.9

## 2016-12-07 LAB — BASIC METABOLIC PANEL
Anion gap: 7 (ref 5–15)
BUN: 14 mg/dL (ref 6–20)
CHLORIDE: 105 mmol/L (ref 101–111)
CO2: 28 mmol/L (ref 22–32)
CREATININE: 0.78 mg/dL (ref 0.61–1.24)
Calcium: 8.8 mg/dL — ABNORMAL LOW (ref 8.9–10.3)
GFR calc non Af Amer: >60 mL/min (ref >60)
GLUCOSE: 84 mg/dL (ref 65–99)
Potassium: 4.2 mmol/L (ref 3.5–5.1)
Sodium: 140 mmol/L (ref 135–145)

## 2016-12-07 NOTE — Patient Instructions (Signed)
  Your procedure is scheduled on: 12/12/16 Tues Report to Same Day Surgery 2nd floor medical mall [facility](Medical Mall Entrance-take elevator on left to 2nd floor.  Check in with surgery information desk.) To find out your arrival time please call [phone] between 1PM - 3PM on 12/11/16 mon Remember: Instructions that are not followed completely may result in serious medical risk, up to and including death, or upon the discretion of your surgeon and anesthesiologist your surgery may need to be rescheduled.    _x___ 1. Do not eat food or drink liquids after midnight. No gum chewing or hard candies.     __x__ 2. No Alcohol for 24 hours before or after surgery.   __x__3. No Smoking for 24 prior to surgery.   ____  4. Bring all medications with you on the day of surgery if instructed.    __x__ 5. Notify your doctor if there is any change in your medical condition     (cold, fever, infections).     Do not wear jewelry, make-up, hairpins, clips or nail polish.  Do not wear lotions, powders, or perfumes. You may wear deodorant.  Do not shave 48 hours prior to surgery. Men may shave face and neck.  Do not bring valuables to the hospital.    [facility] not responsible for any belongings or valuables.               Contacts, dentures or bridgework may not be worn into surgery.  Leave your suitcase in the car. After surgery it may be brought to your room.  For patients admitted to the hospital, discharge time is determined by your treatment team.   Patients discharged the day of surgery will not be allowed to drive home.  You will need someone to drive you home and stay with you the night of your procedure.    Please read over the following fact sheets that you were given:   [facility] Pre[facility] and or MRSA Information   _x___ Take these medicines the morning of surgery with A SIP OF WATER:    1. propranolol (INDERAL  2.  3.  4.  5.  6.  ____Fleets enema or Magnesium Citrate  as directed.   _x___ Use CHG Soap or sage wipes as directed on instruction sheet   ____ Use inhalers on the day of surgery and bring to hospital day of surgery  ____ Stop metformin 2 days prior to surgery    ____ Take 1/2 of usual insulin dose the night before surgery and none on the morning of           surgery.   ____ Stop Aspirin, Coumadin, Pllavix ,Eliquis, Effient, or Pradaxa  x__ Stop Anti-inflammatories such as Advil, Aleve, Ibuprofen, Motrin, Naproxen,          Naprosyn, Goodies powders or aspirin products. Ok to take Tylenol.   ____ Stop supplements until after surgery.    ____ Bring C-Pap to the hospital.

## 2016-12-12 ENCOUNTER — Ambulatory Visit
Admission: RE | Admit: 2016-12-12 | Discharge: 2016-12-12 | Disposition: A | Discharge status: Home / Self Care | Payer: Medicare Other | Source: Ambulatory Visit | Attending: Unknown Physician Specialty | Admitting: Unknown Physician Specialty

## 2016-12-12 ENCOUNTER — Ambulatory Visit: Payer: Medicare Other | Admitting: Anesthesiology

## 2016-12-12 ENCOUNTER — Encounter
Admission: RE | Disposition: A | Discharge status: Home / Self Care | Payer: Self-pay | Source: Ambulatory Visit | Attending: Unknown Physician Specialty

## 2016-12-12 ENCOUNTER — Encounter: Payer: Self-pay | Admitting: *Deleted

## 2016-12-12 DIAGNOSIS — J343 Hypertrophy of nasal turbinates: Secondary | ICD-10-CM | POA: Insufficient documentation

## 2016-12-12 HISTORY — PX: NASAL TURBINATE REDUCTION: SHX2072

## 2016-12-12 LAB — URINE DRUG SCREEN, QUALITATIVE (ARMC ONLY)
Amphetamines, Ur Screen: NOT DETECTED
BENZODIAZEPINE, UR SCRN: NOT DETECTED
Barbiturates, Ur Screen: NOT DETECTED
CANNABINOID 50 NG, UR ~~LOC~~: NOT DETECTED
Cocaine Metabolite,Ur ~~LOC~~: NOT DETECTED
MDMA (Ecstasy)Ur Screen: NOT DETECTED
Methadone Scn, Ur: NOT DETECTED
Opiate, Ur Screen: NOT DETECTED
PHENCYCLIDINE (PCP) UR S: NOT DETECTED
TRICYCLIC, UR SCREEN: POSITIVE — AB

## 2016-12-12 LAB — GLUCOSE, CAPILLARY: Glucose-Capillary: 124 mg/dL — ABNORMAL HIGH (ref 65–99)

## 2016-12-12 SURGERY — EXCISION, NASAL TURBINATE, SUBMUCOSAL
Anesthesia: General | Laterality: Bilateral

## 2016-12-12 MED ORDER — FENTANYL CITRATE (PF) 100 MCG/2ML IJ SOLN
INTRAMUSCULAR | Status: DC | PRN
Start: 2016-12-12 — End: 2016-12-12
  Administered 2016-12-12: 2 ug via INTRAVENOUS

## 2016-12-12 MED ORDER — DEXAMETHASONE SODIUM PHOSPHATE 10 MG/ML IJ SOLN
INTRAMUSCULAR | Status: DC | PRN
  Administered 2016-12-12: 10 mg via INTRAVENOUS

## 2016-12-12 MED ORDER — PROPOFOL 10 MG/ML IV BOLUS
INTRAVENOUS | Status: AC
  Filled 2016-12-12: qty 20

## 2016-12-12 MED ORDER — LIDOCAINE-EPINEPHRINE 1 %-1:100000 IJ SOLN
INTRAMUSCULAR | Status: AC
  Filled 2016-12-12: qty 1

## 2016-12-12 MED ORDER — PHENYLEPHRINE HCL 10 % OP SOLN
Freq: Once | OPHTHALMIC | Status: AC
  Administered 2016-12-12: 10 mL via TOPICAL
  Filled 2016-12-12: qty 10

## 2016-12-12 MED ORDER — OXYMETAZOLINE HCL 0.05 % NA SOLN
6.0000 | Freq: Once | NASAL | Status: AC
  Administered 2016-12-12: 6 via NASAL

## 2016-12-12 MED ORDER — ROCURONIUM BROMIDE 50 MG/5ML IV SOLN
INTRAVENOUS | Status: AC
  Filled 2016-12-12: qty 1

## 2016-12-12 MED ORDER — FAMOTIDINE 20 MG PO TABS
20.0000 mg | ORAL_TABLET | Freq: Once | ORAL | Status: AC
  Administered 2016-12-12: 20 mg via ORAL

## 2016-12-12 MED ORDER — SUCCINYLCHOLINE CHLORIDE 20 MG/ML IJ SOLN
INTRAMUSCULAR | Status: AC
  Filled 2016-12-12: qty 1

## 2016-12-12 MED ORDER — FENTANYL CITRATE (PF) 100 MCG/2ML IJ SOLN: 25.0000 ug | INTRAMUSCULAR | Status: DC | PRN

## 2016-12-12 MED ORDER — OXYMETAZOLINE HCL 0.05 % NA SOLN
NASAL | Status: AC
  Administered 2016-12-12: 6 via NASAL
  Filled 2016-12-12: qty 15

## 2016-12-12 MED ORDER — SUCCINYLCHOLINE CHLORIDE 20 MG/ML IJ SOLN
INTRAMUSCULAR | Status: DC | PRN
  Administered 2016-12-12: 140 mg via INTRAVENOUS

## 2016-12-12 MED ORDER — OXYCODONE-ACETAMINOPHEN 5-325 MG PO TABS
ORAL_TABLET | ORAL | Status: AC
  Administered 2016-12-12: 1
  Filled 2016-12-12: qty 1

## 2016-12-12 MED ORDER — BACITRACIN ZINC 500 UNIT/GM EX OINT
TOPICAL_OINTMENT | CUTANEOUS | Status: AC
  Filled 2016-12-12: qty 28.35

## 2016-12-12 MED ORDER — PHENYLEPHRINE HCL 10 MG/ML IJ SOLN
INTRAMUSCULAR | Status: DC | PRN
  Administered 2016-12-12 (×2): 100 ug via INTRAVENOUS

## 2016-12-12 MED ORDER — DEXAMETHASONE SODIUM PHOSPHATE 10 MG/ML IJ SOLN
INTRAMUSCULAR | Status: AC
  Filled 2016-12-12: qty 1

## 2016-12-12 MED ORDER — ONDANSETRON HCL 4 MG/2ML IJ SOLN: 4.0000 mg | Freq: Once | INTRAMUSCULAR | Status: DC | PRN

## 2016-12-12 MED ORDER — ROCURONIUM BROMIDE 100 MG/10ML IV SOLN
INTRAVENOUS | Status: DC | PRN
  Administered 2016-12-12: 40 mg via INTRAVENOUS

## 2016-12-12 MED ORDER — MIDAZOLAM HCL 2 MG/2ML IJ SOLN
INTRAMUSCULAR | Status: DC | PRN
  Administered 2016-12-12: 2 mg via INTRAVENOUS

## 2016-12-12 MED ORDER — ONDANSETRON HCL 4 MG/2ML IJ SOLN
INTRAMUSCULAR | Status: AC
  Filled 2016-12-12: qty 2

## 2016-12-12 MED ORDER — MIDAZOLAM HCL 2 MG/2ML IJ SOLN
INTRAMUSCULAR | Status: AC
  Filled 2016-12-12: qty 2

## 2016-12-12 MED ORDER — SUGAMMADEX SODIUM 500 MG/5ML IV SOLN
INTRAVENOUS | Status: DC | PRN
  Administered 2016-12-12: 265 mg via INTRAVENOUS
  Administered 2016-12-12: 100 mg via INTRAVENOUS

## 2016-12-12 MED ORDER — SUGAMMADEX SODIUM 500 MG/5ML IV SOLN
INTRAVENOUS | Status: AC
  Filled 2016-12-12: qty 5

## 2016-12-12 MED ORDER — ONDANSETRON HCL 4 MG/2ML IJ SOLN
INTRAMUSCULAR | Status: DC | PRN
  Administered 2016-12-12: 4 mg via INTRAVENOUS

## 2016-12-12 MED ORDER — FAMOTIDINE 20 MG PO TABS
ORAL_TABLET | ORAL | Status: AC
  Administered 2016-12-12: 20 mg via ORAL
  Filled 2016-12-12: qty 1

## 2016-12-12 MED ORDER — FENTANYL CITRATE (PF) 100 MCG/2ML IJ SOLN
INTRAMUSCULAR | Status: AC
  Filled 2016-12-12: qty 2

## 2016-12-12 MED ORDER — LIDOCAINE-EPINEPHRINE 1 %-1:100000 IJ SOLN
INTRAMUSCULAR | Status: DC | PRN
  Administered 2016-12-12: 5 mL

## 2016-12-12 MED ORDER — OXYCODONE-ACETAMINOPHEN 5-325 MG PO TABS: 1.0000 | ORAL_TABLET | ORAL | 0 refills | Status: DC | PRN

## 2016-12-12 MED ORDER — SODIUM CHLORIDE 0.9 % IV SOLN
INTRAVENOUS | Status: DC
  Administered 2016-12-12: 07:00:00 via INTRAVENOUS

## 2016-12-12 SURGICAL SUPPLY — 25 items
BANDAGE EYE OVAL (MISCELLANEOUS) IMPLANT
BLADE SURG 15 STRL LF DISP TIS (BLADE) ×1 IMPLANT
BLADE SURG 15 STRL SS (BLADE) ×2
CANISTER SUCT 1200ML W/VALVE (MISCELLANEOUS) ×3 IMPLANT
COAG SUCT 10F 3.5MM HAND CTRL (MISCELLANEOUS) ×3 IMPLANT
DRESSING NASL FOAM PST OP SINU (MISCELLANEOUS) ×2 IMPLANT
DRSG NASAL FOAM POST OP SINU (MISCELLANEOUS) ×6
ELECT REM PT RETURN 9FT ADLT (ELECTROSURGICAL) ×3
ELECTRODE REM PT RTRN 9FT ADLT (ELECTROSURGICAL) ×1 IMPLANT
GLOVE BIO SURGEON STRL SZ7.5 (GLOVE) ×6 IMPLANT
GOWN STRL REUS W/ TWL LRG LVL3 (GOWN DISPOSABLE) ×2 IMPLANT
GOWN STRL REUS W/TWL LRG LVL3 (GOWN DISPOSABLE) ×4
LABEL OR SOLS (LABEL) ×3 IMPLANT
NS IRRIG 500ML POUR BTL (IV SOLUTION) ×3 IMPLANT
PACK HEAD/NECK (MISCELLANEOUS) ×3 IMPLANT
SPLINT NASAL REUTER .5MM (MISCELLANEOUS) IMPLANT
SPOGE SURGIFLO 8M (HEMOSTASIS)
SPONGE NEURO XRAY DETECT 1X3 (DISPOSABLE) IMPLANT
SPONGE SURGIFLO 8M (HEMOSTASIS) IMPLANT
SUT CHROMIC 3-0 (SUTURE) ×2
SUT CHROMIC 3-0 KS 27XMFL CR (SUTURE) ×1
SUT ETHILON 3-0 KS 30 BLK (SUTURE) ×3 IMPLANT
SUT PLAIN GUT 4-0 (SUTURE) ×3 IMPLANT
SUTURE CHRMC 3-0 KS 27XMFL CR (SUTURE) ×1 IMPLANT
WATER STERILE IRR 1000ML POUR (IV SOLUTION) ×3 IMPLANT

## 2016-12-12 NOTE — Anesthesia Post-op Follow-up Note (Cosign Needed)
Anesthesia QCDR form completed.        

## 2016-12-12 NOTE — Transfer of Care (Signed)
Immediate Anesthesia Transfer of Care Note  Patient: Cory Floyd  Procedure(s) Performed: Procedure(s): TURBINATE REDUCTION/SUBMUCOSAL RESECTION (Bilateral)  Patient Location: PACU  Anesthesia Type:General  Level of Consciousness: awake, alert  and oriented  Airway & Oxygen Therapy: Patient Spontanous Breathing and Patient connected to face mask oxygen  Post-op Assessment: Report given to RN and Post -op Vital signs reviewed and stable  Post vital signs: Reviewed and stable  Last Vitals:  Vitals:   12/12/16 0619 12/12/16 0807  BP: 107/69 134/88  Pulse: 86 76  Resp: 16 (!) 22  Temp: 36.7 C 36.2 C    Last Pain:  Vitals:   12/12/16 0619  TempSrc: Oral  PainSc: 5       Patients Stated Pain Goal: 2 (12/12/16 [id number])  Complications: No apparent anesthesia complications

## 2016-12-12 NOTE — Op Note (Signed)
12/12/2016  7:53 AM    Cory Floyd, Cory Floyd  [id number]   Pre-Op Dx: Frederik SchmidtURBINATE HYPERTROPHY  Post-op Dx: SAME  Proc: Lateral submucous resection inferior turbinates   Surg:  Cory SalmonsMCQUEEN,Cory Floyd  Anes:  GOT  EBL:  Less than 5 cc  Comp:  None  Findings:  Previous small septal perforation enlarged inferior turbinates  Procedure: Minerva Areolaric was identified in the holding area taken the operating room placed in supine position. After general trach anesthesia the table was turned 90. The patient was draped usual fashion for nasal surgery. A topical anesthetic of phenylephrine lidocaine solution was placed on cottonoid pledgets within each nostril approximate 5 minutes these were removed. A local anesthetic of 1% lidocaine with 1 100,000 units of epinephrine was used to inject the inferior turbinates a total of 4 cc was used. Beginning on the left-hand side a 15 blade was used to incise down to and through the mucosa the inferior turbinate to the chondral bone a superior laterally based flap was then elevated the chonchal bone and underlying mucosa were excised using the [employer] scissors. The flap was then laid back over the chondral stop was cauterized using the suction cautery for hemostasis. In similar fashion submucous section was performed on the right. With no active bleeding Stammberger gel was placed beneath each inferior turbinate the patient was return anesthesia where he was awakened in the operating room and taken recovery room in good condition.  Cultures: None  Specimens: None  Dispo:   Good  Plan:  Discharge to home follow-up 1 week  Cory Floyd  12/12/2016 7:53 AM

## 2016-12-12 NOTE — Anesthesia Procedure Notes (Signed)
Procedure Name: Intubation Date/Time: 12/12/2016 7:23 AM Performed by: Omer JackWEATHERLY, Lynetta Tomczak Pre-anesthesia Checklist: Patient identified, Patient being monitored, Timeout performed, Emergency Drugs available and Suction available Patient Re-evaluated:Patient Re-evaluated prior to inductionOxygen Delivery Method: Circle system utilized Preoxygenation: Pre-oxygenation with 100% oxygen Intubation Type: IV induction Ventilation: Mask ventilation without difficulty Laryngoscope Size: 3 and Glidescope Grade View: Grade I Tube type: Oral Rae Tube size: 7.0 mm Number of attempts: 1 Airway Equipment and Method: Stylet Placement Confirmation: ETT inserted through vocal cords under direct vision,  positive ETCO2 and breath sounds checked- equal and bilateral Secured at: 22 cm Tube secured with: Tape Dental Injury: Teeth and Oropharynx as per pre-operative assessment

## 2016-12-12 NOTE — Discharge Instructions (Signed)

## 2016-12-12 NOTE — Anesthesia Postprocedure Evaluation (Signed)
Anesthesia Post Note  Patient: Cory Floyd  Procedure(s) Performed: Procedure(s) (LRB): TURBINATE REDUCTION/SUBMUCOSAL RESECTION (Bilateral)  Patient location during evaluation: PACU Anesthesia Type: General Level of consciousness: awake and alert Pain management: pain level controlled Vital Signs Assessment: post-procedure vital signs reviewed and stable Respiratory status: spontaneous breathing, nonlabored ventilation, respiratory function stable and patient connected to nasal cannula oxygen Cardiovascular status: blood pressure returned to baseline and stable Postop Assessment: no signs of nausea or vomiting Anesthetic complications: no     Last Vitals:  Vitals:   12/12/16 0849 12/12/16 0920  BP: 99/66 115/74  Pulse: 76 86  Resp: 16 18  Temp: (!) 36.1 C 36.1 C    Last Pain:  Vitals:   12/12/16 0920  TempSrc: Temporal  PainSc: 5                  Yevette EdwardsJames G Carreen Milius

## 2016-12-12 NOTE — H&P (Signed)
The patient's history has been reviewed, patient examined, no change in status, stable for surgery.  Questions were answered to the patients satisfaction.  

## 2016-12-12 NOTE — Anesthesia Preprocedure Evaluation (Signed)
Anesthesia Evaluation  Patient identified by MRN, date of birth, ID band Patient awake    Reviewed: Allergy & Precautions, H&P , NPO status , Patient's Chart, lab work & pertinent test results, reviewed documented beta blocker date and time   Airway Mallampati: II  TM Distance: >3 FB Neck ROM: full    Dental  (+) Teeth Intact, Poor Dentition, Missing   Pulmonary neg pulmonary ROS, shortness of breath and with exertion, sleep apnea and Continuous Positive Airway Pressure Ventilation , Current Smoker,    Pulmonary exam normal        Cardiovascular Exercise Tolerance: Good + angina with exertion negative cardio ROS Normal cardiovascular exam Rhythm:regular Rate:Normal     Neuro/Psych PSYCHIATRIC DISORDERS Depression Bipolar Disorder Schizophrenia negative neurological ROS  negative psych ROS   GI/Hepatic negative GI ROS, Neg liver ROS, GERD  Medicated,  Endo/Other  negative endocrine ROS  Renal/GU negative Renal ROS  negative genitourinary   Musculoskeletal  (+) Arthritis ,   Abdominal   Peds  (+) Gastroesophagael problems Hematology negative hematology ROS (+)   Anesthesia Other Findings Past Medical History: No date: Alcoholic (HCC)     Comment: clean for 3 years No date: Anginal pain (HCC) No date: Arthritis No date: Bipolar 1 disorder (HCC) No date: Depressed No date: Dyspnea No date: Fatty liver No date: Schizophrenia [facility](HCC) Past Surgical History: 02/05/2014: COLONOSCOPY     Comment: diverticulosis, hyperplastic polyp x 2 No date: fracture arm Right 2013: KNEE SURGERY Right No date: NASAL SEPTUM SURGERY BMI    Body Mass Index:  42.33 kg/m     Reproductive/Obstetrics negative OB ROS                             Anesthesia Physical Anesthesia Plan  ASA: III  Anesthesia Plan: General ETT   Post-op Pain Management:    Induction: Intravenous, Rapid sequence and Cricoid  pressure planned  Airway Management Planned: Video Laryngoscope Planned  Additional Equipment:   Intra-op Plan:   Post-operative Plan:   Informed Consent: I have reviewed the patients History and Physical, chart, labs and discussed the procedure including the risks, benefits and alternatives for the proposed anesthesia with the patient or authorized representative who has indicated his/her understanding and acceptance.   Dental Advisory Given  Plan Discussed with: CRNA  Anesthesia Plan Comments:         Anesthesia Quick Evaluation

## 2017-04-19 ENCOUNTER — Other Ambulatory Visit: Payer: Self-pay | Admitting: Internal Medicine

## 2017-04-19 DIAGNOSIS — R109 Unspecified abdominal pain: Secondary | ICD-10-CM

## 2017-04-20 ENCOUNTER — Ambulatory Visit
Admission: RE | Admit: 2017-04-20 | Discharge: 2017-04-20 | Disposition: A | Discharge status: Home / Self Care | Payer: Medicare Other | Source: Ambulatory Visit | Attending: Internal Medicine | Admitting: Internal Medicine

## 2017-04-20 DIAGNOSIS — R109 Unspecified abdominal pain: Secondary | ICD-10-CM | POA: Insufficient documentation

## 2017-04-20 DIAGNOSIS — N281 Cyst of kidney, acquired: Secondary | ICD-10-CM | POA: Diagnosis not present

## 2017-04-20 DIAGNOSIS — K760 Fatty (change of) liver, not elsewhere classified: Secondary | ICD-10-CM | POA: Diagnosis not present

## 2017-06-21 ENCOUNTER — Other Ambulatory Visit: Payer: Self-pay | Admitting: Internal Medicine

## 2017-06-21 DIAGNOSIS — M25561 Pain in right knee: Secondary | ICD-10-CM

## 2017-06-28 ENCOUNTER — Ambulatory Visit
Admission: RE | Admit: 2017-06-28 | Discharge: 2017-06-28 | Disposition: A | Discharge status: Home / Self Care | Payer: Medicare Other | Source: Ambulatory Visit | Attending: Internal Medicine | Admitting: Internal Medicine

## 2017-06-28 DIAGNOSIS — M948X6 Other specified disorders of cartilage, lower leg: Secondary | ICD-10-CM | POA: Diagnosis not present

## 2017-06-28 DIAGNOSIS — M25561 Pain in right knee: Secondary | ICD-10-CM | POA: Insufficient documentation

## 2017-06-28 DIAGNOSIS — M23211 Derangement of anterior horn of medial meniscus due to old tear or injury, right knee: Secondary | ICD-10-CM | POA: Diagnosis not present

## 2017-06-28 DIAGNOSIS — M23221 Derangement of posterior horn of medial meniscus due to old tear or injury, right knee: Secondary | ICD-10-CM | POA: Insufficient documentation

## 2017-06-28 DIAGNOSIS — R29898 Other symptoms and signs involving the musculoskeletal system: Secondary | ICD-10-CM | POA: Diagnosis present

## 2017-09-29 ENCOUNTER — Emergency Department
Admission: EM | Admit: 2017-09-29 | Discharge: 2017-09-29 | Disposition: A | Discharge status: Home / Self Care | Payer: Medicare Other | Attending: Emergency Medicine | Admitting: Emergency Medicine

## 2017-09-29 ENCOUNTER — Emergency Department: Payer: Medicare Other

## 2017-09-29 ENCOUNTER — Other Ambulatory Visit: Payer: Self-pay

## 2017-09-29 ENCOUNTER — Encounter: Payer: Self-pay | Admitting: Emergency Medicine

## 2017-09-29 DIAGNOSIS — R61 Generalized hyperhidrosis: Secondary | ICD-10-CM | POA: Diagnosis not present

## 2017-09-29 DIAGNOSIS — R05 Cough: Secondary | ICD-10-CM | POA: Diagnosis not present

## 2017-09-29 DIAGNOSIS — F17210 Nicotine dependence, cigarettes, uncomplicated: Secondary | ICD-10-CM | POA: Diagnosis not present

## 2017-09-29 DIAGNOSIS — R0602 Shortness of breath: Secondary | ICD-10-CM | POA: Diagnosis not present

## 2017-09-29 DIAGNOSIS — M549 Dorsalgia, unspecified: Secondary | ICD-10-CM | POA: Diagnosis not present

## 2017-09-29 DIAGNOSIS — R0789 Other chest pain: Secondary | ICD-10-CM | POA: Diagnosis not present

## 2017-09-29 DIAGNOSIS — Z880 Allergy status to penicillin: Secondary | ICD-10-CM | POA: Diagnosis not present

## 2017-09-29 LAB — BASIC METABOLIC PANEL
ANION GAP: 14 (ref 5–15)
BUN: 14 mg/dL (ref 6–20)
CO2: 21 mmol/L — ABNORMAL LOW (ref 22–32)
Calcium: 9.5 mg/dL (ref 8.9–10.3)
Chloride: 103 mmol/L (ref 101–111)
Creatinine, Ser: 1.01 mg/dL (ref 0.61–1.24)
GLUCOSE: 190 mg/dL — AB (ref 65–99)
POTASSIUM: 3.5 mmol/L (ref 3.5–5.1)
Sodium: 138 mmol/L (ref 135–145)

## 2017-09-29 LAB — CBC
HEMATOCRIT: 41 % (ref 40.0–52.0)
HEMOGLOBIN: 14.1 g/dL (ref 13.0–18.0)
MCH: 32 pg (ref 26.0–34.0)
MCHC: 34.5 g/dL (ref 32.0–36.0)
MCV: 92.8 fL (ref 80.0–100.0)
Platelets: 161 10*3/uL (ref 150–440)
RBC: 4.41 MIL/uL (ref 4.40–5.90)
RDW: 13.7 % (ref 11.5–14.5)
WBC: 9.5 10*3/uL (ref 3.8–10.6)

## 2017-09-29 LAB — BRAIN NATRIURETIC PEPTIDE: B NATRIURETIC PEPTIDE 5: 13 pg/mL (ref 0.0–100.0)

## 2017-09-29 LAB — TROPONIN I: Troponin I: <0.03 ng/mL (ref <0.03)

## 2017-09-29 NOTE — ED Provider Notes (Signed)
[facility] Emergency Department Provider Note       Time seen: ----------------------------------------- 8:54 PM on 09/29/2017 -----------------------------------------   I have reviewed the triage vital signs and the nursing notes.  HISTORY   Chief Complaint Chest Pain and Cough    HPI Cory Floyd is a 41 y.o. male with a history of alcoholism, bipolar disorder, depression and schizophrenia who presents to the ED for chest pain.  Patient reports she has had cough and chest pain for the past 3 days.  He has had productive cough with yellow sputum.  He presented short of breath with diaphoresis.  Comfort was 7 and 10 in the mid chest.  Patient reports he also has pain into his back.  Past Medical History:  Diagnosis Date  . 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 Anginal pain (HCC)   . Arthritis   . Bipolar 1 disorder (HCC)   . Depressed   . Dyspnea   . Fatty liver   . Schizophrenia [facility](HCC)     Patient Active Problem List   Diagnosis Date Noted  . Gastroesophageal reflux disease without esophagitis 09/21/2016  . Obstructive sleep apnea 09/21/2016  . Morbid obesity with BMI of 40.0-44.9, adult (HCC) 08/15/2016  . Stable angina pectoris (HCC) 08/15/2016    Past Surgical History:  Procedure Laterality Date  . COLONOSCOPY  02/05/2014   diverticulosis, hyperplastic polyp x 2  . fracture arm Right   . KNEE SURGERY Right 2013  . NASAL SEPTUM SURGERY    . NASAL TURBINATE REDUCTION Bilateral 12/12/2016   Procedure: TURBINATE REDUCTION/SUBMUCOSAL RESECTION;  Surgeon: Linus Salmonshapman [name], MD;  Location: [facility] ORS;  Service: ENT;  Laterality: Bilateral;    Allergies Carrot [daucus carota] and Penicillins  Social History Social History   Tobacco Use  . Smoking status: Current Every Day Smoker    Packs/day: 1.00    Types: Cigarettes  . Smokeless tobacco: Never Used  Substance Use Topics  . Alcohol use: No  . Drug use: No    Comment: history of  cocaine per Dr Mikey BussingMcQueen's H&P    Review of Systems Constitutional: Negative for fever. Cardiovascular: Positive for chest pain Respiratory: Positive for shortness of breath Gastrointestinal: Negative for abdominal pain, vomiting and diarrhea. Genitourinary: Negative for dysuria. Musculoskeletal: Negative for back pain. Skin: Positive for diaphoresis Neurological: Negative for headaches, focal weakness or numbness.  All systems negative/normal/unremarkable except as stated in the HPI  ____________________________________________   PHYSICAL EXAM:  VITAL SIGNS: ED Triage Vitals  Enc Vitals Group     BP 09/29/17 2047 (!) 154/79     Pulse Rate 09/29/17 2047 (!) 123     Resp 09/29/17 2047 (!) 25     Temp 09/29/17 2047 98.7 F (37.1 C)     Temp Source 09/29/17 2047 Oral     SpO2 09/29/17 2047 98 %     Weight 09/29/17 2044 280 lb (127 kg)     Height 09/29/17 2044 5\' 9"  (1.753 m)     Head Circumference --      Peak Flow --      Pain Score 09/29/17 2044 7     Pain Loc --      Pain Edu? --      Excl. in GC? --     Constitutional: Alert and oriented.  Anxious, no distress Eyes: Conjunctivae are normal. Normal extraocular movements. ENT   Head: Normocephalic and atraumatic.   Nose: No congestion/rhinnorhea.   Mouth/Throat: Mucous  membranes are moist.   Neck: No stridor. Cardiovascular: Normal rate, regular rhythm. No murmurs, rubs, or gallops. Respiratory: Normal respiratory effort without tachypnea nor retractions. Breath sounds are clear and equal bilaterally. No wheezes/rales/rhonchi. Gastrointestinal: Soft and nontender. Normal bowel sounds Musculoskeletal: Nontender with normal range of motion in extremities. No lower extremity tenderness nor edema. Neurologic:  Normal speech and language. No gross focal neurologic deficits are appreciated.  Skin:  Skin is warm, dry and intact. No rash noted. Psychiatric: Mood and affect are normal. Speech and behavior are  normal.  ____________________________________________  EKG: Interpreted by me.  This tachycardia with a rate of 119 bpm, normal PR interval, normal QRS, normal QT.  ____________________________________________  ED COURSE:  Pertinent labs & imaging results that were available during my care of the patient were reviewed by me and considered in my medical decision making (see chart for details). Patient presents for chest pain, we will assess with labs and imaging as indicated.   Procedures ____________________________________________   LABS (pertinent positives/negatives)  Labs Reviewed  BASIC METABOLIC PANEL - Abnormal; Notable for the following components:      Result Value   CO2 21 (*)    Glucose, Bld 190 (*)    All other components within normal limits  CBC  TROPONIN I  BRAIN NATRIURETIC PEPTIDE  TROPONIN I    RADIOLOGY Images were viewed by me  Chest x-ray IMPRESSION: Low lung volumes without focal airspace disease. ____________________________________________  DIFFERENTIAL DIAGNOSIS   Anxiety, GERD, musculoskeletal pain, MI, unstable angina, PE, dissection  FINAL ASSESSMENT AND PLAN  Chest pain   Plan: Patient had presented for chest pain. Patient's labs were negative including repeat troponin. Patient's imaging was also reassuring.  It is unclear if his symptoms are coming from anxiety or musculoskeletal.  He is low risk for ACS and is stable for outpatient follow-up.   Emily FilbertWilliams, Jonathan E, MD   Note: This note was generated in part or whole with voice recognition software. Voice recognition is usually quite accurate but there are transcription errors that can and very often do occur. I apologize for any typographical errors that were not detected and corrected.     Emily FilbertWilliams, Jonathan E, MD 09/29/17 [phone]

## 2017-09-29 NOTE — ED Triage Notes (Signed)
Pt ambulatory to triage, report cough and chest pain x 3 days, report productive w/ yellow sputum, pt appears SOB, pt skin clammy.

## 2017-09-29 NOTE — ED Notes (Signed)
Pt c/o chest pain, worse with cough; productive cough for several days;

## 2017-10-18 ENCOUNTER — Encounter: Payer: Self-pay | Admitting: Emergency Medicine

## 2017-10-18 ENCOUNTER — Emergency Department: Payer: Medicare Other

## 2017-10-18 ENCOUNTER — Other Ambulatory Visit: Payer: Self-pay

## 2017-10-18 ENCOUNTER — Emergency Department
Admission: EM | Admit: 2017-10-18 | Discharge: 2017-10-18 | Disposition: A | Discharge status: Home / Self Care | Payer: Medicare Other | Attending: Emergency Medicine | Admitting: Emergency Medicine

## 2017-10-18 DIAGNOSIS — Z79899 Other long term (current) drug therapy: Secondary | ICD-10-CM | POA: Insufficient documentation

## 2017-10-18 DIAGNOSIS — F17210 Nicotine dependence, cigarettes, uncomplicated: Secondary | ICD-10-CM | POA: Diagnosis not present

## 2017-10-18 DIAGNOSIS — K5732 Diverticulitis of large intestine without perforation or abscess without bleeding: Secondary | ICD-10-CM | POA: Insufficient documentation

## 2017-10-18 DIAGNOSIS — K5792 Diverticulitis of intestine, part unspecified, without perforation or abscess without bleeding: Secondary | ICD-10-CM

## 2017-10-18 DIAGNOSIS — R1031 Right lower quadrant pain: Secondary | ICD-10-CM | POA: Diagnosis present

## 2017-10-18 LAB — CBC WITH DIFFERENTIAL/PLATELET
Basophils Absolute: 0.1 10*3/uL (ref 0–0.1)
Basophils Relative: 1 %
EOS ABS: 0.1 10*3/uL (ref 0–0.7)
EOS PCT: 1 %
HCT: 43.3 % (ref 40.0–52.0)
Hemoglobin: 14.6 g/dL (ref 13.0–18.0)
LYMPHS PCT: 30 %
Lymphs Abs: 2.6 10*3/uL (ref 1.0–3.6)
MCH: 31.8 pg (ref 26.0–34.0)
MCHC: 33.7 g/dL (ref 32.0–36.0)
MCV: 94.2 fL (ref 80.0–100.0)
MONO ABS: 0.7 10*3/uL (ref 0.2–1.0)
MONOS PCT: 8 %
Neutro Abs: 5.1 10*3/uL (ref 1.4–6.5)
Neutrophils Relative %: 60 %
PLATELETS: 176 10*3/uL (ref 150–440)
RBC: 4.6 MIL/uL (ref 4.40–5.90)
RDW: 13.4 % (ref 11.5–14.5)
WBC: 8.5 10*3/uL (ref 3.8–10.6)

## 2017-10-18 LAB — COMPREHENSIVE METABOLIC PANEL
ALBUMIN: 4.1 g/dL (ref 3.5–5.0)
ALT: 103 U/L — AB (ref 17–63)
AST: 79 U/L — AB (ref 15–41)
Alkaline Phosphatase: 83 U/L (ref 38–126)
Anion gap: 8 (ref 5–15)
BUN: 12 mg/dL (ref 6–20)
CHLORIDE: 105 mmol/L (ref 101–111)
CO2: 22 mmol/L (ref 22–32)
CREATININE: 0.94 mg/dL (ref 0.61–1.24)
Calcium: 8.8 mg/dL — ABNORMAL LOW (ref 8.9–10.3)
GFR calc Af Amer: >60 mL/min (ref >60)
GFR calc non Af Amer: >60 mL/min (ref >60)
GLUCOSE: 126 mg/dL — AB (ref 65–99)
POTASSIUM: 3.7 mmol/L (ref 3.5–5.1)
Sodium: 135 mmol/L (ref 135–145)
Total Bilirubin: 0.6 mg/dL (ref 0.3–1.2)
Total Protein: 7.9 g/dL (ref 6.5–8.1)

## 2017-10-18 LAB — URINALYSIS, COMPLETE (UACMP) WITH MICROSCOPIC
Bacteria, UA: NONE SEEN
Bilirubin Urine: NEGATIVE
Glucose, UA: NEGATIVE mg/dL
KETONES UR: NEGATIVE mg/dL
Leukocytes, UA: NEGATIVE
Nitrite: NEGATIVE
PH: 5 (ref 5.0–8.0)
Protein, ur: NEGATIVE mg/dL
SQUAMOUS EPITHELIAL / LPF: NONE SEEN
Specific Gravity, Urine: 1.019 (ref 1.005–1.030)

## 2017-10-18 MED ORDER — METRONIDAZOLE 500 MG PO TABS: 500.0000 mg | ORAL_TABLET | Freq: Three times a day (TID) | ORAL | 0 refills | Status: AC

## 2017-10-18 MED ORDER — IOPAMIDOL (ISOVUE-300) INJECTION 61%
30.0000 mL | Freq: Once | INTRAVENOUS | Status: AC | PRN
  Administered 2017-10-18: 30 mL via ORAL

## 2017-10-18 MED ORDER — CIPROFLOXACIN HCL 500 MG PO TABS: 500.0000 mg | ORAL_TABLET | Freq: Two times a day (BID) | ORAL | 0 refills | Status: AC

## 2017-10-18 MED ORDER — ONDANSETRON HCL 4 MG/2ML IJ SOLN
4.0000 mg | Freq: Once | INTRAMUSCULAR | Status: AC
  Administered 2017-10-18: 4 mg via INTRAVENOUS
  Filled 2017-10-18: qty 2

## 2017-10-18 MED ORDER — MORPHINE SULFATE (PF) 4 MG/ML IV SOLN
4.0000 mg | Freq: Once | INTRAVENOUS | Status: AC
  Administered 2017-10-18: 4 mg via INTRAVENOUS
  Filled 2017-10-18: qty 1

## 2017-10-18 MED ORDER — OXYCODONE-ACETAMINOPHEN 5-325 MG PO TABS: 1.0000 | ORAL_TABLET | ORAL | 0 refills | Status: DC | PRN

## 2017-10-18 MED ORDER — IOPAMIDOL (ISOVUE-300) INJECTION 61%
125.0000 mL | Freq: Once | INTRAVENOUS | Status: AC | PRN
  Administered 2017-10-18: 125 mL via INTRAVENOUS

## 2017-10-18 MED ORDER — IOPAMIDOL (ISOVUE-370) INJECTION 76%: 125.0000 mL | Freq: Once | INTRAVENOUS | Status: DC | PRN

## 2017-10-18 NOTE — ED Triage Notes (Signed)
Pt in with co rlq pain x 3 days, denies any n.v.d or dysuria. Pt denies any hx of the same.

## 2017-10-18 NOTE — ED Notes (Signed)
Pt resting in bed awaiting d/c instructions.

## 2017-10-18 NOTE — ED Notes (Signed)
Received report from Lurena Joinerebecca, [place], care assumed.  Pt resting in bed, states he has 4/10 pain in right lower quad.  Appears in nad. Watching movie on phone at this time.

## 2017-10-18 NOTE — Discharge Instructions (Signed)
Take the Cipro 1 twice a day and the metronidazole 1 3 times a day to help treat the infection.  Take the Percocet 1 pill 4 times a day if needed for pain.  Please return to the emergency room for worse pain fever or vomiting or feeling sicker.  Please have your regular doctor check on you within the week.

## 2017-10-18 NOTE — ED Provider Notes (Signed)
[facility] Emergency Department Provider Note   ____________________________________________   First MD Initiated Contact with Patient 10/18/17 [phone]     (approximate)  I have reviewed the triage vital signs and the nursing notes.   HISTORY  Chief Complaint Abdominal Pain    HPI Cory Curiaric E Floyd is a 41 y.o. male patient reports right lower quadrant pain for 2 days seems to be getting worse.  He has no history of nausea vomiting diarrhea fever or dysuria.  Pain does not move or go anywhere else.  It is made worse with palpation and percussion.  Patient is getting severe.  Seems to be achy.  Past Medical History:  Diagnosis Date  . 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 Anginal pain (HCC)   . Arthritis   . Bipolar 1 disorder (HCC)   . Depressed   . Dyspnea   . Fatty liver   . Schizophrenia [facility](HCC)     Patient Active Problem List   Diagnosis Date Noted  . Gastroesophageal reflux disease without esophagitis 09/21/2016  . Obstructive sleep apnea 09/21/2016  . Morbid obesity with BMI of 40.0-44.9, adult (HCC) 08/15/2016  . Stable angina pectoris (HCC) 08/15/2016    Past Surgical History:  Procedure Laterality Date  . COLONOSCOPY  02/05/2014   diverticulosis, hyperplastic polyp x 2  . fracture arm Right   . KNEE SURGERY Right 2013  . NASAL SEPTUM SURGERY    . NASAL TURBINATE REDUCTION Bilateral 12/12/2016   Procedure: TURBINATE REDUCTION/SUBMUCOSAL RESECTION;  Surgeon: Linus Salmonshapman [name], MD;  Location: [facility] ORS;  Service: ENT;  Laterality: Bilateral;    Cory to Admission medications   Medication Sig Start Date End Date Taking? Authorizing Provider  acetaminophen (TYLENOL) 500 MG tablet Take 1,000 mg by mouth daily as needed for headache.    [provider]  ARIPiprazole (ABILIFY) 15 MG tablet Take 15 mg by mouth at bedtime. 11/13/16   [provider]  atorvastatin (LIPITOR) 10 MG tablet Take 1 tablet by mouth daily. 09/03/17    [provider]  ciprofloxacin (CIPRO) 500 MG tablet Take 1 tablet (500 mg total) by mouth 2 (two) times daily for 10 days. 10/18/17 10/28/17  Arnaldo NatalMalinda, Random Dobrowski F, MD  divalproex (DEPAKOTE ER) 500 MG 24 hr tablet Take 1,500 mg by mouth at bedtime. TAKES 3 CAPSULES AT BEDTIME 08/10/16   [provider]  hydrochlorothiazide (MICROZIDE) 12.5 MG capsule Take 1 capsule by mouth daily. 08/21/17   [provider]  metroNIDAZOLE (FLAGYL) 500 MG tablet Take 1 tablet (500 mg total) by mouth 3 (three) times daily for 10 days. 10/18/17 10/28/17  Arnaldo NatalMalinda, Lula Kolton F, MD  oxyCODONE-acetaminophen (ROXICET) 5-325 MG tablet Take 1-2 tablets by mouth every 4 (four) hours as needed for severe pain. 12/12/16   Linus SalmonsMcQueen, Chapman, MD  oxyCODONE-acetaminophen (ROXICET) 5-325 MG tablet Take 1 tablet by mouth every 4 (four) hours as needed for severe pain. 10/18/17   Arnaldo NatalMalinda, Verita Kuroda F, MD  propranolol (INDERAL) 20 MG tablet Take 40 mg by mouth daily. At 10:00 am 11/13/16   [provider]  traZODone (DESYREL) 100 MG tablet Take 200 mg by mouth at bedtime.     [provider]    Allergies Carrot [daucus carota] and Penicillins  Family History  Problem Relation Age of Onset  . Hepatitis C Mother   . Cancer Maternal Aunt   . Hypertension Maternal Grandmother     Social History Social History   Tobacco Use  .  Smoking status: Current Every Day Smoker    Packs/day: 1.00    Types: Cigarettes  . Smokeless tobacco: Never Used  Substance Use Topics  . Alcohol use: No  . Drug use: No    Comment: history of cocaine per Dr Mikey BussingMcQueen's H&P    Review of Systems  Constitutional: No fever/chills Eyes: No visual changes. ENT: No sore throat. Cardiovascular: Denies chest pain. Respiratory: Denies shortness of breath. Gastrointestinal: See HPI Genitourinary: Negative for dysuria. Musculoskeletal: Negative for back pain. Skin: Negative for rash. Neurological: Negative for headaches, focal  weakness   ____________________________________________   PHYSICAL EXAM:  VITAL SIGNS: ED Triage Vitals  Enc Vitals Group     BP 10/18/17 0532 139/83     Pulse Rate 10/18/17 0532 (!) 109     Resp 10/18/17 0532 18     Temp 10/18/17 0532 98.4 F (36.9 C)     Temp Source 10/18/17 0532 Oral     SpO2 10/18/17 0532 99 %     Weight 10/18/17 0532 260 lb (117.9 kg)     Height 10/18/17 0532 5\' 10"  (1.778 m)     Head Circumference --      Peak Flow --      Pain Score 10/18/17 0531 8     Pain Loc --      Pain Edu? --      Excl. in GC? --     Constitutional: Alert and oriented. Well appearing and in no acute distress. Eyes: Conjunctivae are normal.  Head: Atraumatic. Nose: No congestion/rhinnorhea. Mouth/Throat: Mucous membranes are moist.  Oropharynx non-erythematous. Neck: No stridor. Cardiovascular: Normal rate, regular rhythm. Grossly normal heart sounds.  Good peripheral circulation. Respiratory: Normal respiratory effort.  No retractions. Lungs CTAB. Gastrointestinal: Soft and nontender except for in the right lower quadrant was tender to palpation and percussion. No distention. No abdominal bruits. No CVA tenderness. Musculoskeletal: No lower extremity tenderness nor edema.  No joint effusions. Neurologic:  Normal speech and language. No gross focal neurologic deficits are appreciated. No gait instability. Skin:  Skin is warm, dry and intact. No rash noted. Psychiatric: Mood and affect are normal. Speech and behavior are normal.  ____________________________________________   LABS (all labs ordered are listed, but only abnormal results are displayed)  Labs Reviewed  COMPREHENSIVE METABOLIC PANEL - Abnormal; Notable for the following components:      Result Value   Glucose, Bld 126 (*)    Calcium 8.8 (*)    AST 79 (*)    ALT 103 (*)    All other components within normal limits  URINALYSIS, COMPLETE (UACMP) WITH MICROSCOPIC - Abnormal; Notable for the following  components:   Color, Urine YELLOW (*)    APPearance CLEAR (*)    Hgb urine dipstick SMALL (*)    All other components within normal limits  CBC WITH DIFFERENTIAL/PLATELET   ____________________________________________  EKG   ____________________________________________  RADIOLOGY  CT shows mild diverticulitis of the sigmoid ____________________________________________   PROCEDURES  Procedure(s) performed:   Procedures  Critical Care performed:   ____________________________________________   INITIAL IMPRESSION / ASSESSMENT AND PLAN / ED COURSE Patient's pain at the present time is not severe is not nauseated he looks well is not running a fever is does not have a high white count will attempt p.o. antibiotics at home discussed with this with him in detail he will return if he is worse has worse pain vomiting or fever.  He will follow-up with his doctor within the week.  Clinical Course as of Oct 18 748  Thu Oct 18, 2017  0711 Chloride: 105 [PM]    Clinical Course User Index [PM] Arnaldo Natal, MD     ____________________________________________   FINAL CLINICAL IMPRESSION(S) / ED DIAGNOSES  Final diagnoses:  Diverticulitis     ED Discharge Orders        Ordered    ciprofloxacin (CIPRO) 500 MG tablet  2 times daily     10/18/17 0747    metroNIDAZOLE (FLAGYL) 500 MG tablet  3 times daily     10/18/17 0747    oxyCODONE-acetaminophen (ROXICET) 5-325 MG tablet  Every 4 hours PRN     10/18/17 0747       Note:  This document was prepared using Dragon voice recognition software and may include unintentional dictation errors.    Arnaldo Natal, MD 10/18/17 [id number]

## 2018-04-10 ENCOUNTER — Ambulatory Visit: Payer: Medicare Other | Admitting: Urology

## 2018-06-17 ENCOUNTER — Ambulatory Visit (INDEPENDENT_AMBULATORY_CARE_PROVIDER_SITE_OTHER): Payer: Medicare Other | Admitting: Urology

## 2018-06-17 ENCOUNTER — Encounter: Payer: Self-pay | Admitting: Urology

## 2018-06-17 VITALS — BP 143/87 | HR 92 | Ht 70.0 in | Wt 246.0 lb

## 2018-06-17 DIAGNOSIS — Z3009 Encounter for other general counseling and advice on contraception: Secondary | ICD-10-CM | POA: Diagnosis not present

## 2018-06-17 NOTE — Progress Notes (Signed)
06/17/2018 8:57 AM   Pryor CuriaEric E Copland 10-Nov-1975 [id number]  Referring provider: Margaretann LovelessKhan, Neelam S, MD [address] [place], [place] [id number]  Chief Complaint  Patient presents with  . VAS Consult    HPI: 42 year old male presents for vasectomy counseling.  He is divorced with 2 daughters both in their 9120s.  He desires vasectomy as a means of permanent sterilization.  There is no previous history of urologic problems and he specifically denies chronic scrotal pain or history of epididymitis/orchitis.   PMH: Past Medical History:  Diagnosis Date  . 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 Anginal pain (HCC)   . Arthritis   . Bipolar 1 disorder (HCC)   . Depressed   . Dyspnea   . Fatty liver   . Schizophrenia [facility](HCC)     Surgical History: Past Surgical History:  Procedure Laterality Date  . COLONOSCOPY  02/05/2014   diverticulosis, hyperplastic polyp x 2  . fracture arm Right   . KNEE SURGERY Right 2013  . NASAL SEPTUM SURGERY    . NASAL TURBINATE REDUCTION Bilateral 12/12/2016   Procedure: TURBINATE REDUCTION/SUBMUCOSAL RESECTION;  Surgeon: Linus Salmonshapman [name], MD;  Location: [facility] ORS;  Service: ENT;  Laterality: Bilateral;    Home Medications:  Allergies as of 06/17/2018      Reactions   Carrot [daucus Carota] Anaphylaxis, Rash   Penicillins Anaphylaxis, Hives   Has patient had a PCN reaction causing immediate rash, facial/tongue/throat swelling, SOB or lightheadedness with hypotension: Yes Has patient had a PCN reaction causing severe rash involving mucus membranes or skin necrosis: No Has patient had a PCN reaction that required hospitalization Yes Has patient had a PCN reaction occurring within the last 10 years: No If all of the above answers are "NO", then may proceed with Cephalosporin use.      Medication List        Accurate as of 06/17/18  8:57 AM. Always use your most recent med list.          acetaminophen 500 MG tablet Commonly known as:  TYLENOL Take  1,000 mg by mouth daily as needed for headache.   ARIPiprazole 15 MG tablet Commonly known as:  ABILIFY Take 15 mg by mouth at bedtime.   atorvastatin 10 MG tablet Commonly known as:  LIPITOR Take 1 tablet by mouth daily.   baclofen 10 MG tablet Commonly known as:  LIORESAL   cyclobenzaprine 10 MG tablet Commonly known as:  FLEXERIL cyclobenzaprine 10 mg tablet   divalproex 500 MG 24 hr tablet Commonly known as:  DEPAKOTE ER Take 1,500 mg by mouth at bedtime. TAKES 3 CAPSULES AT BEDTIME   gabapentin 300 MG capsule Commonly known as:  NEURONTIN gabapentin 300 mg capsule   hydrochlorothiazide 12.5 MG capsule Commonly known as:  MICROZIDE Take 1 capsule by mouth daily.   meloxicam 15 MG tablet Commonly known as:  MOBIC meloxicam 15 mg tablet   omeprazole 40 MG capsule Commonly known as:  PRILOSEC   oxyCODONE-acetaminophen 5-325 MG tablet Commonly known as:  PERCOCET/ROXICET Take 1-2 tablets by mouth every 4 (four) hours as needed for severe pain.   oxyCODONE-acetaminophen 5-325 MG tablet Commonly known as:  PERCOCET/ROXICET Take 1 tablet by mouth every 4 (four) hours as needed for severe pain.   propranolol 20 MG tablet Commonly known as:  INDERAL Take 40 mg by mouth daily. At 10:00 am   traMADol 50 MG tablet Commonly known as:  ULTRAM tramadol 50 mg tablet  traZODone 100 MG tablet Commonly known as:  DESYREL Take 200 mg by mouth at bedtime.       Allergies:  Allergies  Allergen Reactions  . Carrot [Daucus Carota] Anaphylaxis and Rash  . Penicillins Anaphylaxis and Hives    Has patient had a PCN reaction causing immediate rash, facial/tongue/throat swelling, SOB or lightheadedness with hypotension: Yes Has patient had a PCN reaction causing severe rash involving mucus membranes or skin necrosis: No Has patient had a PCN reaction that required hospitalization Yes Has patient had a PCN reaction occurring within the last 10 years: No If all of the above  answers are "NO", then may proceed with Cephalosporin use.     Family History: Family History  Problem Relation Age of Onset  . Hepatitis C Mother   . Cancer Mother   . Cancer Maternal Aunt   . Hypertension Maternal Grandmother     Social History:  reports that he has been smoking cigarettes. He has a 20.00 pack-year smoking history. He has never used smokeless tobacco. He reports that he does not drink alcohol or use drugs.  ROS: UROLOGY Frequent Urination?: Yes Hard to postpone urination?: No Burning/pain with urination?: No Get up at night to urinate?: Yes Leakage of urine?: No Urine stream starts and stops?: Yes Trouble starting stream?: No Do you have to strain to urinate?: No Blood in urine?: No Urinary tract infection?: No Sexually transmitted disease?: No Injury to kidneys or bladder?: No Painful intercourse?: No Weak stream?: No Erection problems?: No Penile pain?: No  Gastrointestinal Nausea?: No Vomiting?: No Indigestion/heartburn?: No Diarrhea?: No Constipation?: Yes  Constitutional Fever: No Night sweats?: Yes Weight loss?: No Fatigue?: Yes  Skin Skin rash/lesions?: No Itching?: No  Eyes Blurred vision?: No Double vision?: No  Ears/Nose/Throat Sore throat?: No Sinus problems?: No  Hematologic/Lymphatic Swollen glands?: No Easy bruising?: No  Cardiovascular Leg swelling?: No Chest pain?: No  Respiratory Cough?: Yes Shortness of breath?: Yes  Endocrine Excessive thirst?: No  Musculoskeletal Back pain?: Yes Joint pain?: No  Neurological Headaches?: Yes Dizziness?: No  Psychologic Depression?: Yes Anxiety?: Yes  Physical Exam: BP (!) 143/87 (BP Location: Left Arm, Patient Position: Sitting, Cuff Size: Large)   Pulse 92   Ht 5\' 10"  (1.778 m)   Wt 246 lb (111.6 kg)   BMI 35.30 kg/m   Constitutional:  Alert and oriented, No acute distress. HEENT: [place] AT, moist mucus membranes.  Trachea midline, no  masses. Cardiovascular: No clubbing, cyanosis, or edema. Respiratory: Normal respiratory effort, no increased work of breathing. GI: Abdomen is soft, nontender, nondistended, no abdominal masses GU: No CVA tenderness.  Penis circumcised without lesions testes descended bilateral without masses or tenderness.  Vasa easily palpable bilaterally. Lymph: No cervical or inguinal lymphadenopathy. Skin: No rashes, bruises or suspicious lesions. Neurologic: Grossly intact, no focal deficits, moving all 4 extremities. Psychiatric: Normal mood and affect.   Assessment & Plan:   We had a long discussion about vasectomy. We specifically discussed the procedure, recovery and the risks, benefits and alternatives of vasectomy. I explained that the procedure entails removal of a segment of each vas deferens, each of which conducts sperm, and that the purpose of this procedure is to cause sterility (inability to produce children or cause pregnancy). Vasectomy is intended to be permanent and irreversible form of contraception. Options for fertility after vasectomy include vasectomy reversal, or sperm retrieval with in vitro fertilization. These options are not always successful, and they may be expensive. We discussed reversible forms  of birth control such as condoms, IUD or diaphragms, as well as the option of freezing sperm in a sperm bank prior to the vasectomy procedure. We discussed the importance of avoiding strenuous exercise for four days after vasectomy, and the importance of refraining from any form of ejaculation for seven days after vasectomy. I explained that vasectomy does not produce immediate sterility so another form of contraceptive must be used until sterility is assured by having semen checked for sperm. Thus, a post vasectomy semen analysis is necessary to confirm sterility. Rarely, vasectomy must be repeated. We discussed the approximately 1 in 2,000 risk of pregnancy after vasectomy for men who have  post-vasectomy semen analysis showing absent sperm or rare non-motile sperm. Typical side effects include a small amount of oozing blood, some discomfort and mild swelling in the area of incision.  Vasectomy does not affect sexual performance, function, please, sensation, interest, desire, satisfaction, penile erection, volume of semen or ejaculation. Other rare risks include allergy or adverse reaction to an anesthetic, testicular atrophy, hematoma, infection/abscess, prolonged tenderness of the vas deferens, pain, swelling, painful nodule or scar (called sperm granuloma) or epididymtis. We discussed chronic testicular pain syndrome. This has been reported to occur in as many as 1-2% of men and may be permanent. This can be treated with medication, small procedures or (rarely) surgery.   Riki Altes, MD  [facility] [address] [place], [place] [address] [phone]

## 2018-07-24 ENCOUNTER — Encounter: Payer: Self-pay | Admitting: Urology

## 2018-07-24 ENCOUNTER — Encounter: Payer: Medicare Other | Admitting: Urology

## 2018-09-18 ENCOUNTER — Other Ambulatory Visit: Payer: Self-pay

## 2018-09-18 ENCOUNTER — Emergency Department
Admission: EM | Admit: 2018-09-18 | Discharge: 2018-09-18 | Disposition: A | Discharge status: Home / Self Care | Payer: Medicare Other | Attending: Emergency Medicine | Admitting: Emergency Medicine

## 2018-09-18 ENCOUNTER — Encounter: Payer: Self-pay | Admitting: Emergency Medicine

## 2018-09-18 DIAGNOSIS — R5383 Other fatigue: Secondary | ICD-10-CM | POA: Diagnosis not present

## 2018-09-18 DIAGNOSIS — Z79899 Other long term (current) drug therapy: Secondary | ICD-10-CM | POA: Diagnosis not present

## 2018-09-18 DIAGNOSIS — R531 Weakness: Secondary | ICD-10-CM | POA: Diagnosis present

## 2018-09-18 DIAGNOSIS — F17210 Nicotine dependence, cigarettes, uncomplicated: Secondary | ICD-10-CM | POA: Insufficient documentation

## 2018-09-18 LAB — COMPREHENSIVE METABOLIC PANEL
ALBUMIN: 4.3 g/dL (ref 3.5–5.0)
ALT: 57 U/L — AB (ref 0–44)
AST: 49 U/L — ABNORMAL HIGH (ref 15–41)
Alkaline Phosphatase: 66 U/L (ref 38–126)
Anion gap: 9 (ref 5–15)
BILIRUBIN TOTAL: 0.6 mg/dL (ref 0.3–1.2)
BUN: 14 mg/dL (ref 6–20)
CALCIUM: 8.4 mg/dL — AB (ref 8.9–10.3)
CO2: 24 mmol/L (ref 22–32)
CREATININE: 0.72 mg/dL (ref 0.61–1.24)
Chloride: 108 mmol/L (ref 98–111)
GFR calc Af Amer: >60 mL/min (ref >60)
GFR calc non Af Amer: >60 mL/min (ref >60)
GLUCOSE: 90 mg/dL (ref 70–99)
Potassium: 4.1 mmol/L (ref 3.5–5.1)
SODIUM: 141 mmol/L (ref 135–145)
Total Protein: 7.7 g/dL (ref 6.5–8.1)

## 2018-09-18 LAB — URINALYSIS, COMPLETE (UACMP) WITH MICROSCOPIC
BACTERIA UA: NONE SEEN
Bilirubin Urine: NEGATIVE
Glucose, UA: NEGATIVE mg/dL
Ketones, ur: NEGATIVE mg/dL
Leukocytes, UA: NEGATIVE
Nitrite: NEGATIVE
PROTEIN: NEGATIVE mg/dL
Specific Gravity, Urine: 1.019 (ref 1.005–1.030)
pH: 5 (ref 5.0–8.0)

## 2018-09-18 LAB — CBC WITH DIFFERENTIAL/PLATELET
ABS IMMATURE GRANULOCYTES: 0.01 10*3/uL (ref 0.00–0.07)
BASOS ABS: 0 10*3/uL (ref 0.0–0.1)
Basophils Relative: 1 %
EOS ABS: 0.1 10*3/uL (ref 0.0–0.5)
EOS PCT: 2 %
HEMATOCRIT: 45.6 % (ref 39.0–52.0)
Hemoglobin: 15.2 g/dL (ref 13.0–17.0)
Immature Granulocytes: 0 %
LYMPHS ABS: 2.1 10*3/uL (ref 0.7–4.0)
Lymphocytes Relative: 34 %
MCH: 32.1 pg (ref 26.0–34.0)
MCHC: 33.3 g/dL (ref 30.0–36.0)
MCV: 96.4 fL (ref 80.0–100.0)
MONOS PCT: 7 %
Monocytes Absolute: 0.4 10*3/uL (ref 0.1–1.0)
NEUTROS PCT: 56 %
Neutro Abs: 3.5 10*3/uL (ref 1.7–7.7)
Platelets: 182 10*3/uL (ref 150–400)
RBC: 4.73 MIL/uL (ref 4.22–5.81)
RDW: 13 % (ref 11.5–15.5)
WBC: 6.1 10*3/uL (ref 4.0–10.5)
nRBC: 0 % (ref 0.0–0.2)

## 2018-09-18 LAB — URINE DRUG SCREEN, QUALITATIVE (ARMC ONLY)
Amphetamines, Ur Screen: NOT DETECTED
BARBITURATES, UR SCREEN: NOT DETECTED
BENZODIAZEPINE, UR SCRN: NOT DETECTED
CANNABINOID 50 NG, UR ~~LOC~~: NOT DETECTED
COCAINE METABOLITE, UR ~~LOC~~: NOT DETECTED
MDMA (Ecstasy)Ur Screen: NOT DETECTED
METHADONE SCREEN, URINE: NOT DETECTED
Opiate, Ur Screen: NOT DETECTED
Phencyclidine (PCP) Ur S: NOT DETECTED
TRICYCLIC, UR SCREEN: NOT DETECTED

## 2018-09-18 LAB — TROPONIN I

## 2018-09-18 LAB — ETHANOL

## 2018-09-18 LAB — LIPASE, BLOOD: Lipase: 28 U/L (ref 11–51)

## 2018-09-18 MED ORDER — SODIUM CHLORIDE 0.9 % IV SOLN
Freq: Once | INTRAVENOUS | Status: AC
  Administered 2018-09-18: 21:00:00 via INTRAVENOUS

## 2018-09-18 NOTE — ED Provider Notes (Signed)
[facility] Emergency Department Provider Note       Time seen: ----------------------------------------- 8:51 PM on 09/18/2018 -----------------------------------------   I have reviewed the triage vital signs and the nursing notes.  HISTORY   Chief Complaint Weakness    HPI Cory Floyd is a 42 y.o. male with a history of alcohol abuse, arthritis, bipolar disorder, depression, schizophrenia who presents to the ED for weakness with nausea, frequent thirst and body soreness.  Patient reports he been asleep all day and does not remember why he did not wake up to go to work.  He denies any recent illness or other complaints at this time.  Past Medical History:  Diagnosis Date  . 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 Anginal pain (HCC)   . Arthritis   . Bipolar 1 disorder (HCC)   . Depressed   . Dyspnea   . Fatty liver   . Schizophrenia [facility])     Patient Active Problem List   Diagnosis Date Noted  . Gastroesophageal reflux disease without esophagitis 09/21/2016  . Obstructive sleep apnea 09/21/2016  . Morbid obesity with BMI of 40.0-44.9, adult (HCC) 08/15/2016  . Stable angina pectoris (HCC) 08/15/2016    Past Surgical History:  Procedure Laterality Date  . COLONOSCOPY  02/05/2014   diverticulosis, hyperplastic polyp x 2  . fracture arm Right   . KNEE SURGERY Right 2013  . NASAL SEPTUM SURGERY    . NASAL TURBINATE REDUCTION Bilateral 12/12/2016   Procedure: TURBINATE REDUCTION/SUBMUCOSAL RESECTION;  Surgeon: Linus Salmons, MD;  Location: [facility] ORS;  Service: ENT;  Laterality: Bilateral;    Allergies Carrot [daucus carota] and Penicillins  Social History Social History   Tobacco Use  . Smoking status: Current Every Day Smoker    Packs/day: 1.00    Years: 20.00    Pack years: 20.00    Types: Cigarettes  . Smokeless tobacco: Never Used  Substance Use Topics  . Alcohol use: Yes  . Drug use: No    Comment: history of cocaine  per Dr Mikey Bussing H&P   Review of Systems Constitutional: Negative for fever. Cardiovascular: Negative for chest pain. Respiratory: Negative for shortness of breath. Gastrointestinal: Negative for abdominal pain, positive for nausea Musculoskeletal: Negative for back pain. Skin: Negative for rash. Neurological: Negative for headaches, positive for weakness  All systems negative/normal/unremarkable except as stated in the HPI  ____________________________________________   PHYSICAL EXAM:  VITAL SIGNS: ED Triage Vitals  Enc Vitals Group     BP 09/18/18 2029 (!) 134/97     Pulse Rate 09/18/18 2029 95     Resp 09/18/18 2029 20     Temp 09/18/18 2029 98 F (36.7 C)     Temp Source 09/18/18 2029 Oral     SpO2 09/18/18 2029 97 %     Weight 09/18/18 2026 240 lb (108.9 kg)     Height 09/18/18 2026 5\' 9"  (1.753 m)     Head Circumference --      Peak Flow --      Pain Score 09/18/18 2026 3     Pain Loc --      Pain Edu? --      Excl. in [place]? --    Constitutional: Alert and oriented. Well appearing and in no distress. Eyes: Conjunctivae are normal. Normal extraocular movements. ENT   Head: Normocephalic and atraumatic.   Nose: No congestion/rhinnorhea.   Mouth/Throat: Mucous membranes are moist.   Neck: No stridor. Cardiovascular: Normal  rate, regular rhythm. No murmurs, rubs, or gallops. Respiratory: Normal respiratory effort without tachypnea nor retractions. Breath sounds are clear and equal bilaterally. No wheezes/rales/rhonchi. Gastrointestinal: Soft and nontender. Normal bowel sounds Musculoskeletal: Nontender with normal range of motion in extremities. No lower extremity tenderness nor edema. Neurologic:  Normal speech and language. No gross focal neurologic deficits are appreciated.  Skin:  Skin is warm, dry and intact. No rash noted. Psychiatric: Mood and affect are normal. Speech and behavior are normal.  ____________________________________________  EKG:  Interpreted by me.  Sinus tachycardia with a rate of 3 bpm, normal PR interval, normal QRS, normal QT  ____________________________________________  ED COURSE:  As part of my medical decision making, I reviewed the following data within the electronic MEDICAL RECORD NUMBER History obtained from family if available, nursing notes, old chart and ekg, as well as notes from prior ED visits. Patient presented for weakness, we will assess with labs and imaging as indicated at this time.   Procedures ____________________________________________   LABS (pertinent positives/negatives)  Labs Reviewed  COMPREHENSIVE METABOLIC PANEL - Abnormal; Notable for the following components:      Result Value   Calcium 8.4 (*)    AST 49 (*)    ALT 57 (*)    All other components within normal limits  URINALYSIS, COMPLETE (UACMP) WITH MICROSCOPIC - Abnormal; Notable for the following components:   Color, Urine YELLOW (*)    APPearance CLEAR (*)    Hgb urine dipstick SMALL (*)    All other components within normal limits  CBC WITH DIFFERENTIAL/PLATELET  LIPASE, BLOOD  TROPONIN I  ETHANOL  URINE DRUG SCREEN, QUALITATIVE ([facility] ONLY)   ____________________________________________  DIFFERENTIAL DIAGNOSIS   Dehydration, electrolyte abnormality, occult infection, medication side effect, schizophrenia  FINAL ASSESSMENT AND PLAN  Fatigue   Plan: The patient had presented for fatigue, nausea, thirst and myalgias. Patient's labs are reassuring.  No clear etiology for his symptoms.  He is cleared for outpatient follow-up.Ulice Dash.    Johnathan E Sofhia Ulibarri, MD   Note: This note was generated in part or whole with voice recognition software. Voice recognition is usually quite accurate but there are transcription errors that can and very often do occur. I apologize for any typographical errors that were not detected and corrected.     Emily FilbertWilliams, Matej Sappenfield E, MD 09/18/18 [phone]

## 2018-09-18 NOTE — ED Triage Notes (Addendum)
Pt to triage via w/c with no distress noted, brought in by EMS; pt reports "I just woke up to them at the door" st  that he missed work and his boss called the police; pt c/o nausea, thirst, and feeling shaky and "body soreness"; pt reports that he has been asleep all day and doesn't remember why he didn't wake up for work; pt cont to st "I'm thirsty; I want to go to sleep"

## 2018-09-18 NOTE — ED Notes (Signed)
Red top recollected and sent to lab at this time.

## 2019-07-29 ENCOUNTER — Other Ambulatory Visit: Payer: Self-pay

## 2019-07-29 DIAGNOSIS — Z20822 Contact with and (suspected) exposure to covid-19: Secondary | ICD-10-CM

## 2019-07-31 LAB — NOVEL CORONAVIRUS, NAA: SARS-CoV-2, NAA: NOT DETECTED

## 2019-09-16 ENCOUNTER — Other Ambulatory Visit: Payer: Self-pay

## 2019-09-16 ENCOUNTER — Encounter: Payer: Self-pay | Admitting: Emergency Medicine

## 2019-09-16 ENCOUNTER — Emergency Department
Admission: EM | Admit: 2019-09-16 | Discharge: 2019-09-17 | Disposition: A | Discharge status: Other Acute Inpatient Hospital | Payer: Medicare Other | Attending: Emergency Medicine | Admitting: Emergency Medicine

## 2019-09-16 DIAGNOSIS — I208 Other forms of angina pectoris: Secondary | ICD-10-CM | POA: Diagnosis present

## 2019-09-16 DIAGNOSIS — Z008 Encounter for other general examination: Secondary | ICD-10-CM

## 2019-09-16 DIAGNOSIS — F17210 Nicotine dependence, cigarettes, uncomplicated: Secondary | ICD-10-CM | POA: Insufficient documentation

## 2019-09-16 DIAGNOSIS — F319 Bipolar disorder, unspecified: Secondary | ICD-10-CM | POA: Insufficient documentation

## 2019-09-16 DIAGNOSIS — Z20828 Contact with and (suspected) exposure to other viral communicable diseases: Secondary | ICD-10-CM | POA: Insufficient documentation

## 2019-09-16 DIAGNOSIS — K219 Gastro-esophageal reflux disease without esophagitis: Secondary | ICD-10-CM | POA: Diagnosis present

## 2019-09-16 DIAGNOSIS — G4733 Obstructive sleep apnea (adult) (pediatric): Secondary | ICD-10-CM | POA: Diagnosis present

## 2019-09-16 DIAGNOSIS — F22 Delusional disorders: Secondary | ICD-10-CM | POA: Diagnosis present

## 2019-09-16 DIAGNOSIS — Z791 Long term (current) use of non-steroidal anti-inflammatories (NSAID): Secondary | ICD-10-CM | POA: Insufficient documentation

## 2019-09-16 DIAGNOSIS — F29 Unspecified psychosis not due to a substance or known physiological condition: Secondary | ICD-10-CM | POA: Diagnosis present

## 2019-09-16 DIAGNOSIS — Z79899 Other long term (current) drug therapy: Secondary | ICD-10-CM | POA: Insufficient documentation

## 2019-09-16 LAB — CBC
HCT: 43 % (ref 39.0–52.0)
Hemoglobin: 14.3 g/dL (ref 13.0–17.0)
MCH: 30.8 pg (ref 26.0–34.0)
MCHC: 33.3 g/dL (ref 30.0–36.0)
MCV: 92.7 fL (ref 80.0–100.0)
Platelets: 218 10*3/uL (ref 150–400)
RBC: 4.64 MIL/uL (ref 4.22–5.81)
RDW: 13.3 % (ref 11.5–15.5)
WBC: 8.6 10*3/uL (ref 4.0–10.5)
nRBC: 0 % (ref 0.0–0.2)

## 2019-09-16 LAB — COMPREHENSIVE METABOLIC PANEL
ALT: 37 U/L (ref 0–44)
AST: 37 U/L (ref 15–41)
Albumin: 4.6 g/dL (ref 3.5–5.0)
Alkaline Phosphatase: 58 U/L (ref 38–126)
Anion gap: 13 (ref 5–15)
BUN: 11 mg/dL (ref 6–20)
CO2: 23 mmol/L (ref 22–32)
Calcium: 9.3 mg/dL (ref 8.9–10.3)
Chloride: 105 mmol/L (ref 98–111)
Creatinine, Ser: 0.95 mg/dL (ref 0.61–1.24)
GFR calc Af Amer: >60 mL/min (ref >60)
GFR calc non Af Amer: >60 mL/min (ref >60)
Glucose, Bld: 96 mg/dL (ref 70–99)
Potassium: 3.4 mmol/L — ABNORMAL LOW (ref 3.5–5.1)
Sodium: 141 mmol/L (ref 135–145)
Total Bilirubin: 1 mg/dL (ref 0.3–1.2)
Total Protein: 8.1 g/dL (ref 6.5–8.1)

## 2019-09-16 LAB — URINE DRUG SCREEN, QUALITATIVE (ARMC ONLY)
Amphetamines, Ur Screen: NOT DETECTED
Barbiturates, Ur Screen: NOT DETECTED
Benzodiazepine, Ur Scrn: POSITIVE — AB
Cannabinoid 50 Ng, Ur ~~LOC~~: NOT DETECTED
Cocaine Metabolite,Ur ~~LOC~~: NOT DETECTED
MDMA (Ecstasy)Ur Screen: NOT DETECTED
Methadone Scn, Ur: NOT DETECTED
Opiate, Ur Screen: NOT DETECTED
Phencyclidine (PCP) Ur S: NOT DETECTED
Tricyclic, Ur Screen: NOT DETECTED

## 2019-09-16 LAB — SALICYLATE LEVEL: Salicylate Lvl: <7 mg/dL (ref 2.8–30.0)

## 2019-09-16 LAB — ACETAMINOPHEN LEVEL: Acetaminophen (Tylenol), Serum: <10 ug/mL — ABNORMAL LOW (ref 10–30)

## 2019-09-16 LAB — ETHANOL: Alcohol, Ethyl (B): <10 mg/dL (ref <10)

## 2019-09-16 MED ORDER — ACETAMINOPHEN 325 MG PO TABS
650.0000 mg | ORAL_TABLET | Freq: Once | ORAL | Status: AC
  Administered 2019-09-16: 17:00:00 650 mg via ORAL
  Filled 2019-09-16: qty 2

## 2019-09-16 NOTE — ED Notes (Signed)
Report given to Amy T., RN. 
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
## 2019-09-16 NOTE — ED Notes (Signed)
IVC/Consult ordered/ Moved to [facility]  

## 2019-09-16 NOTE — ED Triage Notes (Signed)
Pt with 2 black shoes, 2 white socks, 1 pair of jeans, 1 pair of boxer, 1 black long sleeved shirt. Clothes bagged, tagged and given to [employer].  Pt with a brown wallet and $455 in his wallet. Wallet given Amy RN to lock up in the hospital safe.     [phone] (friend) Margreta Journey- [phone] (frined) Aniceto Boss- [phone] Andee Poles- [phone]

## 2019-09-16 NOTE — BH Assessment (Signed)
Assessment Note  Cory Floyd is an 43 y.o. male who presents to ED with persecutory, grandiose, and jealous delusions. He reports "my phone has been sending sound waves of my girlfriend sucking her ex-boyfriend's %^&# and having sex with him ... and a trojan horse app has been duplicating my apps". Patient also called the police tonight because he believes he as a sound recording on his phone of someone being raped and "they're saying my name". He is fixated on the delusion that someone has downloaded child pornagraphy onto his cell phone and trying to set him up. Patient reports he last smoked crack cocaine "yesterday - a good amount". Patient denied SI/HI/AVH. Furthermore, he reports currently living in a hotel because he recently sold his house "for a large lump sum of money". Patient was alert and pleasant in his demeanor. He was not observed responding to internal stimuli.   Patient is currently linked with [facility] for peer support and medication management (Dr. Adrienne Mocha). He reports being med compliant with his current prescribed medication (Depakote and Abilify). He has a past history of multiple inpatient hospitalizations, several of which were at [facility] (2014-2012).  Diagnosis: Bipolar 1 Disorder, by history  Past Medical History:  Past Medical History:  Diagnosis Date  . 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 Anginal pain (HCC)   . Arthritis   . Bipolar 1 disorder (HCC)   . Depressed   . Dyspnea   . Fatty liver   . Schizophrenia [facility])     Past Surgical History:  Procedure Laterality Date  . COLONOSCOPY  02/05/2014   diverticulosis, hyperplastic polyp x 2  . fracture arm Right   . KNEE SURGERY Right 2013  . NASAL SEPTUM SURGERY    . NASAL TURBINATE REDUCTION Bilateral 12/12/2016   Procedure: TURBINATE REDUCTION/SUBMUCOSAL RESECTION;  Surgeon: Linus Salmons, MD;  Location: [facility] ORS;  Service: ENT;  Laterality: Bilateral;    Family History:  Family History   Problem Relation Age of Onset  . Hepatitis C Mother   . Cancer Mother   . Cancer Maternal Aunt   . Hypertension Maternal Grandmother     Social History:  reports that he has been smoking cigarettes. He has a 20.00 pack-year smoking history. He has never used smokeless tobacco. He reports current alcohol use. He reports that he does not use drugs.  Additional Social History:  Alcohol / Drug Use Pain Medications: See MAR Prescriptions: See MAR Over the Counter: See MAR History of alcohol / drug use?: Yes Longest period of sobriety (when/how long): [facility]N 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, Personal relationships, Work / School Withdrawal Symptoms: (Denied) Substance #1 Name of Substance 1: Crack Cocaine 1 - Age of First Use: Unable to Quantify 1 - Amount (size/oz): "a good amount" 1 - Frequency: Unable to Quantify 1 - Duration: Unable to Quantify 1 - Last Use / Amount: 09/15/2019  CIWA: CIWA-Ar BP: 130/64 Pulse Rate: 89 COWS:    Allergies:  Allergies  Allergen Reactions  . Carrot [Daucus Carota] Anaphylaxis and Rash  . Penicillins Anaphylaxis and Hives    Has patient had a PCN reaction causing immediate rash, facial/tongue/throat swelling, SOB or lightheadedness with hypotension: Yes Has patient had a PCN reaction causing severe rash involving mucus membranes or skin necrosis: No Has patient had a PCN reaction that required hospitalization Yes Has patient had a PCN reaction occurring within the last 10 years: No If all of the above answers are "  NO", then may proceed with Cephalosporin use.     Home Medications: (Not in a hospital admission)   OB/GYN Status:  No LMP for male patient.  General Assessment Data Location of Assessment: [facility] ED TTS Assessment: In system Is this a Tele or Face-to-Face Assessment?: Face-to-Face Is this an Initial Assessment or a Re-assessment for this encounter?: Initial Assessment Patient Accompanied by:: N/A Language Other than  English: No Living Arrangements: Homeless/Shelter What gender do you identify as?: Male Marital status: Single Maiden name: N/A Living Arrangements: Alone Can pt return to current living arrangement?: Yes Admission Status: Involuntary Petitioner: Police Is patient capable of signing voluntary admission?: No Referral Source: Other(Police Department) Insurance type: Medicare  Medical Screening Exam ([place]) Medical Exam completed: Yes  Crisis Care Plan Living Arrangements: Alone Legal Guardian: Other:(Self) Name of Psychiatrist: Dr. Gladys Damme - [facility] Name of Therapist: [facility]  Education Status Is patient currently in school?: No Is the patient employed, unemployed or receiving disability?: Unemployed, Receiving disability income  Risk to self with the past 6 months Suicidal Ideation: No Has patient been a risk to self within the past 6 months prior to admission? : No Suicidal Intent: No Has patient had any suicidal intent within the past 6 months prior to admission? : No Is patient at risk for suicide?: No Suicidal Plan?: No Has patient had any suicidal plan within the past 6 months prior to admission? : No Access to Means: No What has been your use of drugs/alcohol within the last 12 months?: Crack Cocaine Previous Attempts/Gestures: No(None Reported) How many times?: 0 Other Self Harm Risks: None Triggers for Past Attempts: None known Intentional Self Injurious Behavior: None Family Suicide History: Unknown Recent stressful life event(s): Conflict (Comment), Other (Comment)(Conflict with girlfriend) Persecutory voices/beliefs?: Yes Depression: No Depression Symptoms: (None Reported) Substance abuse history and/or treatment for substance abuse?: Yes Suicide prevention information given to non-admitted patients: Not applicable  Risk to Others within the past 6 months Homicidal Ideation: No Does patient have any lifetime risk of  violence toward others beyond the six months prior to admission? : No Thoughts of Harm to Others: No Current Homicidal Intent: No Current Homicidal Plan: No Access to Homicidal Means: No Identified Victim: None History of harm to others?: No Assessment of Violence: None Noted Violent Behavior Description: None Does patient have access to weapons?: No Criminal Charges Pending?: No Does patient have a court date: No Is patient on probation?: No  Psychosis Hallucinations: None noted Delusions: Grandiose, Persecutory, Jealous  Mental Status Report Appearance/Hygiene: In scrubs, In hospital gown Eye Contact: Fair Motor Activity: Freedom of movement Speech: Pressured, Rapid Level of Consciousness: Alert Mood: Pleasant Affect: Appropriate to circumstance Anxiety Level: Minimal Thought Processes: Tangential Judgement: Partial Orientation: Person, Place, Time, Situation Obsessive Compulsive Thoughts/Behaviors: Minimal  Cognitive Functioning Concentration: Normal Memory: Recent Intact, Remote Intact Is patient IDD: No Insight: see judgement above Impulse Control: Good Appetite: Good Have you had any weight changes? : No Change Sleep: No Change Total Hours of Sleep: 6 Vegetative Symptoms: None  ADLScreening [facility] Assessment Services) Patient's cognitive ability adequate to safely complete daily activities?: Yes Patient able to express need for assistance with ADLs?: Yes Independently performs ADLs?: Yes (appropriate for developmental age)  Prior Inpatient Therapy Prior Inpatient Therapy: Yes Prior Therapy Dates: 10/2012; 04/2012; 03/2012; 10/2011; 09/2011 Prior Therapy Facilty/Provider(s): [facility] Reason for Treatment: Pt was unable to recall the reason for past admissions  Prior Outpatient Therapy Prior Outpatient Therapy: Yes Prior  Therapy Dates: Current Prior Therapy Facilty/Provider(s): [facility] Reason for Treatment: Peer Support; Medication  Management Does patient have an ACCT team?: No Does patient have Intensive In-House Services?  : No Does patient have [facility] services? : No Does patient have P4CC services?: No  ADL Screening (condition at time of admission) Patient's cognitive ability adequate to safely complete daily activities?: Yes Patient able to express need for assistance with ADLs?: Yes Independently performs ADLs?: Yes (appropriate for developmental age)       Abuse/Neglect Assessment (Assessment to be complete while patient is alone) Abuse/Neglect Assessment Can Be Completed: Yes Physical Abuse: Denies Verbal Abuse: Denies Sexual Abuse: Denies Exploitation of patient/patient's resources: Denies Self-Neglect: Denies Values / Beliefs Cultural Requests During Hospitalization: None Spiritual Requests During Hospitalization: None Consults Spiritual Care Consult Needed: No Social Work Consult Needed: No         Child/Adolescent Assessment Running Away Risk: (Patient is an adult)  Disposition:  Disposition Initial Assessment Completed for this Encounter: Yes Disposition of Patient: (Pending psych consult)  On Site Evaluation by:   Reviewed with Physician:    Mamie NickSHALETA S SPEASE 09/16/2019 8:08 PM

## 2019-09-16 NOTE — ED Triage Notes (Signed)
Pt in via [facility] under IVC. Pt is paranoid.

## 2019-09-17 ENCOUNTER — Inpatient Hospital Stay
Admission: RE | Admit: 2019-09-17 | Discharge: 2019-09-23 | DRG: 885 | Disposition: A | Discharge status: Home / Self Care | Payer: Medicare Other | Source: Intra-hospital | Attending: Psychiatry | Admitting: Psychiatry

## 2019-09-17 ENCOUNTER — Other Ambulatory Visit: Payer: Self-pay

## 2019-09-17 DIAGNOSIS — F29 Unspecified psychosis not due to a substance or known physiological condition: Secondary | ICD-10-CM | POA: Diagnosis present

## 2019-09-17 DIAGNOSIS — Z9114 Patient's other noncompliance with medication regimen: Secondary | ICD-10-CM | POA: Diagnosis not present

## 2019-09-17 DIAGNOSIS — Z20828 Contact with and (suspected) exposure to other viral communicable diseases: Secondary | ICD-10-CM | POA: Diagnosis present

## 2019-09-17 DIAGNOSIS — R45851 Suicidal ideations: Secondary | ICD-10-CM | POA: Diagnosis present

## 2019-09-17 DIAGNOSIS — F250 Schizoaffective disorder, bipolar type: Principal | ICD-10-CM | POA: Diagnosis present

## 2019-09-17 DIAGNOSIS — I10 Essential (primary) hypertension: Secondary | ICD-10-CM | POA: Diagnosis present

## 2019-09-17 DIAGNOSIS — F17210 Nicotine dependence, cigarettes, uncomplicated: Secondary | ICD-10-CM | POA: Diagnosis present

## 2019-09-17 DIAGNOSIS — F3164 Bipolar disorder, current episode mixed, severe, with psychotic features: Secondary | ICD-10-CM | POA: Diagnosis not present

## 2019-09-17 DIAGNOSIS — G4700 Insomnia, unspecified: Secondary | ICD-10-CM | POA: Diagnosis present

## 2019-09-17 DIAGNOSIS — Z880 Allergy status to penicillin: Secondary | ICD-10-CM | POA: Diagnosis not present

## 2019-09-17 DIAGNOSIS — F319 Bipolar disorder, unspecified: Secondary | ICD-10-CM | POA: Diagnosis present

## 2019-09-17 DIAGNOSIS — F22 Delusional disorders: Secondary | ICD-10-CM

## 2019-09-17 LAB — SARS CORONAVIRUS 2 (TAT 6-24 HRS): SARS Coronavirus 2: NEGATIVE

## 2019-09-17 MED ORDER — ACETAMINOPHEN 325 MG PO TABS
650.0000 mg | ORAL_TABLET | Freq: Once | ORAL | Status: AC
  Administered 2019-09-17: 650 mg via ORAL

## 2019-09-17 MED ORDER — ACETAMINOPHEN 325 MG PO TABS: 650.0000 mg | ORAL_TABLET | Freq: Four times a day (QID) | ORAL | Status: DC | PRN

## 2019-09-17 MED ORDER — ALUM & MAG HYDROXIDE-SIMETH 200-200-20 MG/5ML PO SUSP: 30.0000 mL | ORAL | Status: DC | PRN

## 2019-09-17 MED ORDER — GABAPENTIN 300 MG PO CAPS
300.0000 mg | ORAL_CAPSULE | Freq: Two times a day (BID) | ORAL | Status: DC
  Administered 2019-09-17: 300 mg via ORAL
  Filled 2019-09-17: qty 1

## 2019-09-17 MED ORDER — PROPRANOLOL HCL 10 MG PO TABS
40.0000 mg | ORAL_TABLET | Freq: Every day | ORAL | Status: DC
  Administered 2019-09-17: 11:00:00 40 mg via ORAL
  Filled 2019-09-17: qty 4

## 2019-09-17 MED ORDER — ATORVASTATIN CALCIUM 10 MG PO TABS
10.0000 mg | ORAL_TABLET | Freq: Every day | ORAL | Status: DC
  Administered 2019-09-17: 11:00:00 10 mg via ORAL
  Filled 2019-09-17: qty 1

## 2019-09-17 MED ORDER — PANTOPRAZOLE SODIUM 40 MG PO TBEC
40.0000 mg | DELAYED_RELEASE_TABLET | Freq: Every day | ORAL | Status: DC
  Administered 2019-09-17: 40 mg via ORAL
  Filled 2019-09-17: qty 1

## 2019-09-17 MED ORDER — DIVALPROEX SODIUM ER 500 MG PO TB24: 1500.0000 mg | ORAL_TABLET | Freq: Every day | ORAL | Status: DC

## 2019-09-17 MED ORDER — TRAZODONE HCL 100 MG PO TABS
200.0000 mg | ORAL_TABLET | Freq: Every evening | ORAL | Status: DC | PRN
  Administered 2019-09-17 – 2019-09-22 (×3): 200 mg via ORAL
  Filled 2019-09-17 (×3): qty 2

## 2019-09-17 MED ORDER — TRAZODONE HCL 100 MG PO TABS: 200.0000 mg | ORAL_TABLET | Freq: Every day | ORAL | Status: DC

## 2019-09-17 MED ORDER — MAGNESIUM HYDROXIDE 400 MG/5ML PO SUSP: 30.0000 mL | Freq: Every day | ORAL | Status: DC | PRN

## 2019-09-17 MED ORDER — IBUPROFEN 600 MG PO TABS
600.0000 mg | ORAL_TABLET | Freq: Four times a day (QID) | ORAL | Status: DC | PRN
  Administered 2019-09-17 – 2019-09-18 (×2): 600 mg via ORAL
  Filled 2019-09-17 (×2): qty 1

## 2019-09-17 MED ORDER — BACLOFEN 10 MG PO TABS
10.0000 mg | ORAL_TABLET | Freq: Every morning | ORAL | Status: DC
  Administered 2019-09-17: 10 mg via ORAL
  Filled 2019-09-17 (×2): qty 1

## 2019-09-17 MED ORDER — ARIPIPRAZOLE 5 MG PO TABS: 15.0000 mg | ORAL_TABLET | Freq: Every day | ORAL | Status: DC

## 2019-09-17 NOTE — Plan of Care (Signed)
Patient newly admitted, hasn't had time to progress   Problem: Education: Goal: Knowledge of [facility] Education information/materials will improve Outcome: Not Progressing Goal: Emotional status will improve Outcome: Not Progressing Goal: Mental status will improve Outcome: Not Progressing Goal: Verbalization of understanding the information provided will improve Outcome: Not Progressing   Problem: Safety: Goal: Periods of time without injury will increase Outcome: Not Progressing   Problem: Activity: Goal: Will verbalize the importance of balancing activity with adequate rest periods Outcome: Not Progressing   Problem: Safety: Goal: Ability to redirect hostility and anger into socially appropriate behaviors will improve Outcome: Not Progressing Goal: Ability to remain free from injury will improve Outcome: Not Progressing   

## 2019-09-17 NOTE — Tx Team (Signed)
Initial Treatment Plan 09/17/2019 8:53 PM ZACKRY DEINES TKP:[id number]    PATIENT STRESSORS: Marital or family conflict Medication change or noncompliance   PATIENT STRENGTHS: Motivation for treatment/growth Supportive family/friends   PATIENT IDENTIFIED PROBLEMS: Acute Psychosis  Anxiety  Depression                 DISCHARGE CRITERIA:  Improved stabilization in mood, thinking, and/or behavior Verbal commitment to aftercare and medication compliance  PRELIMINARY DISCHARGE PLAN: Outpatient therapy Return to previous living arrangement  PATIENT/FAMILY INVOLVEMENT: This treatment plan has been presented to and reviewed with the patient, SHOAIB SIEFKER. The patient has been given the opportunity to ask questions and make suggestions.  Mallie Darting, RN 09/17/2019, 8:53 PM

## 2019-09-17 NOTE — Progress Notes (Signed)
Patient was admitted from [facility], report received from [place], [place]. Patient was irritable during assessment stating he was in severe back pain. Patient denies SI/HIAVH, depression and anxiety. When assessed about what brought him into the hospital the patient stated, "I am not going to talk about that." Patient given education. Patient skin assessment performed with Pamala Hurry, RN, no abnormalities found. Patient oriented to the unit and his room. Patient given support and encouragement to be active in his treatment plan. Patient informed to let staff know if there are any issues or problems on the unit. Patient being monitored Q 15 minutes for safety per unit protocol. Patient remains safe on the unit.

## 2019-09-17 NOTE — Consult Note (Signed)
[facility] Face-to-Face Psychiatry Consult   Reason for Consult: IVC Referring Physician: Dr. Fuller Plan Patient Identification: Cory Floyd MRN:  [id number] Principal Diagnosis: Paranoia [facility]) Diagnosis:  Principal Problem:   Paranoia (HCC) Active Problems:   Gastroesophageal reflux disease without esophagitis   Morbid obesity with BMI of 40.0-44.9, adult (HCC)   Obstructive sleep apnea   Stable angina pectoris (HCC)   Psychosis (HCC)   Total Time spent with patient: 30 minutes  Subjective: I hear voices when I have my phone next to me.  Cory Floyd is a 43 y.o. male patient presented to [facility] ED via law enforcement under involuntary commitment status (IVC). He reports, "my phone has been sending sound waves of my girlfriend and her ex-boyfriend doing some things."  The patient states, "I do not want to discuss this anymore." The patient was seen face-to-face by this provider; chart reviewed and consulted with Dr. Fuller Plan on 09/16/2019 due to the patient's care. It was discussed with the EDP that the patient does meet criteria to be admitted to the psychiatric inpatient unit.  On evaluation, the patient is alert and oriented but very delusional and getting irritated as his assessment continues. He is, uncooperative, and mood-congruent with affect.  The patient does not appear to be responding to internal or external stimuli. He is presenting with some delusional thinking. The patient denies auditory or visual hallucinations.  As he describes his cell phone and his girlfriend and her ex-boyfriend, he makes references such as; " I called the police tonight because I believed they had a sound recording on my phone of someone being raped and they're saying my name."  The patient is fixated on his delusions that someone has downloaded child pornography onto his cell phone and trying to set him up. The patient reports he last smoked crack cocaine "yesterday - a good amount."  The patient denies any  suicidal, homicidal, or self-harm ideations. The patient is presenting with psychotic and paranoid behaviors.   Plan: The patient is a safety risk to self and does require psychiatric inpatient admission for stabilization and treatment. HPI: Per Dr. Fuller Plan; Cory Floyd is a 43 y.o. male with bipolar 1, depression who comes with police under IVC.  Patient reports that if he told me his story that I would think that he was crazy but he says that it is 100% true.  He talks about how his phone was tapped and played a repeat loop recording of his ex-girlfriend trying to have sexual intercourse with other people.  He says that at one point he opened his phone and somebody had imported pictures of child porn on it and he called the police a few days ago and they took his phone from him to investigate it.  He thinks that his ex-girlfriend is planning to have another friend frame that he raped her.  He does have a history of bipolar and says he is been taking his medications.  He says that he has had days where he stays awake for 3 days in a row and then will sleep for 12 hours.  He does use alcohol and drugs.  Patient was IVC for paranoia by police.  Denies cough, fevers.     Past Psychiatric History:  Alcoholic (HCC) Clean for 3 years Bipolar 1 disorder (HCC) Depressed Schizophrenia (HCC)  Risk to Self: Suicidal Ideation: No Suicidal Intent: No Is patient at risk for suicide?: No Suicidal Plan?: No Access to Means: No What has been your  use of drugs/alcohol within the last 12 months?: Crack Cocaine How many times?: 0 Other Self Harm Risks: None Triggers for Past Attempts: None known Intentional Self Injurious Behavior: None Risk to Others: Homicidal Ideation: No Thoughts of Harm to Others: No Current Homicidal Intent: No Current Homicidal Plan: No Access to Homicidal Means: No Identified Victim: None History of harm to others?: No Assessment of Violence: None Noted Violent Behavior  Description: None Does patient have access to weapons?: No Criminal Charges Pending?: No Does patient have a court date: No Prior Inpatient Therapy: Prior Inpatient Therapy: Yes Prior Therapy Dates: 10/2012; 04/2012; 03/2012; 10/2011; 09/2011 Prior Therapy Facilty/Provider(s): [facility] Reason for Treatment: Pt was unable to recall the reason for past admissions Prior Outpatient Therapy: Prior Outpatient Therapy: Yes Prior Therapy Dates: Current Prior Therapy Facilty/Provider(s): [facility] Reason for Treatment: Peer Support; Medication Management Does patient have an ACCT team?: No Does patient have Intensive In-House Services?  : No Does patient have [facility] services? : No Does patient have P4CC services?: No  Past Medical History:  Past Medical History:  Diagnosis Date  . 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 Anginal pain (HCC)   . Arthritis   . Bipolar 1 disorder (HCC)   . Depressed   . Dyspnea   . Fatty liver   . Schizophrenia [facility])     Past Surgical History:  Procedure Laterality Date  . COLONOSCOPY  02/05/2014   diverticulosis, hyperplastic polyp x 2  . fracture arm Right   . KNEE SURGERY Right 2013  . NASAL SEPTUM SURGERY    . NASAL TURBINATE REDUCTION Bilateral 12/12/2016   Procedure: TURBINATE REDUCTION/SUBMUCOSAL RESECTION;  Surgeon: Linus Salmons, MD;  Location: [facility] ORS;  Service: ENT;  Laterality: Bilateral;   Family History:  Family History  Problem Relation Age of Onset  . Hepatitis C Mother   . Cancer Mother   . Cancer Maternal Aunt   . Hypertension Maternal Grandmother    Family Psychiatric  History:  Social History:  Social History   Substance and Sexual Activity  Alcohol Use Yes     Social History   Substance and Sexual Activity  Drug Use No   Comment: history of cocaine per Dr Mikey Bussing H&P    Social History   Socioeconomic History  . Marital status: Divorced    Spouse name: Not on file  . Number of children: Not on file   . Years of education: Not on file  . Highest education level: Not on file  Occupational History  . Not on file  Social Needs  . Financial resource strain: Not on file  . Food insecurity    Worry: Not on file    Inability: Not on file  . Transportation needs    Medical: Not on file    Non-medical: Not on file  Tobacco Use  . Smoking status: Current Every Day Smoker    Packs/day: 1.00    Years: 20.00    Pack years: 20.00    Types: Cigarettes  . Smokeless tobacco: Never Used  Substance and Sexual Activity  . Alcohol use: Yes  . Drug use: No    Comment: history of cocaine per Dr Mikey Bussing H&P  . Sexual activity: Yes    Birth control/protection: None  Lifestyle  . Physical activity    Days per week: Not on file    Minutes per session: Not on file  . Stress: Not on file  Relationships  . Social connections  Talks on phone: Not on file    Gets together: Not on file    Attends religious service: Not on file    Active member of club or organization: Not on file    Attends meetings of clubs or organizations: Not on file    Relationship status: Not on file  Other Topics Concern  . Not on file  Social History Narrative  . Not on file   Additional Social History:    Allergies:   Allergies  Allergen Reactions  . Carrot [Daucus Carota] Anaphylaxis and Rash  . Penicillins Anaphylaxis and Hives    Has patient had a PCN reaction causing immediate rash, facial/tongue/throat swelling, SOB or lightheadedness with hypotension: Yes Has patient had a PCN reaction causing severe rash involving mucus membranes or skin necrosis: No Has patient had a PCN reaction that required hospitalization Yes Has patient had a PCN reaction occurring within the last 10 years: No If all of the above answers are "NO", then may proceed with Cephalosporin use.     Labs:  Results for orders placed or performed during the hospital encounter of 09/16/19 (from the past 48 hour(s))  Comprehensive  metabolic panel     Status: Abnormal   Collection Time: 09/16/19  4:20 PM  Result Value Ref Range   Sodium 141 135 - 145 mmol/L   Potassium 3.4 (L) 3.5 - 5.1 mmol/L   Chloride 105 98 - 111 mmol/L   CO2 23 22 - 32 mmol/L   Glucose, Bld 96 70 - 99 mg/dL   BUN 11 6 - 20 mg/dL   Creatinine, Ser 1.61 0.61 - 1.24 mg/dL   Calcium 9.3 8.9 - 09.6 mg/dL   Total Protein 8.1 6.5 - 8.1 g/dL   Albumin 4.6 3.5 - 5.0 g/dL   AST 37 15 - 41 U/L   ALT 37 0 - 44 U/L   Alkaline Phosphatase 58 38 - 126 U/L   Total Bilirubin 1.0 0.3 - 1.2 mg/dL   GFR calc non Af Amer >60 >60 mL/min   GFR calc Af Amer >60 >60 mL/min   Anion gap 13 5 - 15    Comment: Performed at [facility], [address] [place], [place] [address]  Ethanol     Status: None   Collection Time: 09/16/19  4:20 PM  Result Value Ref Range   Alcohol, Ethyl (B) <10 <10 mg/dL    Comment: (NOTE) Lowest detectable limit for serum alcohol is 10 mg/dL. For medical purposes only. Performed at [facility], [address] [place], [place] [address]   Salicylate level     Status: None   Collection Time: 09/16/19  4:20 PM  Result Value Ref Range   Salicylate Lvl <7.0 2.8 - 30.0 mg/dL    Comment: Performed at [facility], [address] [place], [place] [address]  Acetaminophen level     Status: Abnormal   Collection Time: 09/16/19  4:20 PM  Result Value Ref Range   Acetaminophen (Tylenol), Serum <10 (L) 10 - 30 ug/mL    Comment: (NOTE) Therapeutic concentrations vary significantly. A range of 10-30 ug/mL  may be an effective concentration for many patients. However, some  are best treated at concentrations outside of this range. Acetaminophen concentrations >150 ug/mL at 4 hours after ingestion  and >50 ug/mL at 12 hours after ingestion are often associated with  toxic reactions. Performed at [facility], [address], [place], [place] [address]   cbc  Status: None   Collection Time:  09/16/19  4:20 PM  Result Value Ref Range   WBC 8.6 4.0 - 10.5 K/uL   RBC 4.64 4.22 - 5.81 MIL/uL   Hemoglobin 14.3 13.0 - 17.0 g/dL   HCT 40.9 81.1 - 91.4 %   MCV 92.7 80.0 - 100.0 fL   MCH 30.8 26.0 - 34.0 pg   MCHC 33.3 30.0 - 36.0 g/dL   RDW 78.2 95.6 - 21.3 %   Platelets 218 150 - 400 K/uL   nRBC 0.0 0.0 - 0.2 %    Comment: Performed at [facility], [name], [place], [place] [address]  Urine Drug Screen, Qualitative     Status: Abnormal   Collection Time: 09/16/19  4:20 PM  Result Value Ref Range   Tricyclic, Ur Screen NONE DETECTED NONE DETECTED   Amphetamines, Ur Screen NONE DETECTED NONE DETECTED   MDMA (Ecstasy)Ur Screen NONE DETECTED NONE DETECTED   Cocaine Metabolite,Ur [place] NONE DETECTED NONE DETECTED   Opiate, Ur Screen NONE DETECTED NONE DETECTED   Phencyclidine (PCP) Ur S NONE DETECTED NONE DETECTED   Cannabinoid 50 Ng, Ur [place] NONE DETECTED NONE DETECTED   Barbiturates, Ur Screen NONE DETECTED NONE DETECTED   Benzodiazepine, Ur Scrn POSITIVE (A) NONE DETECTED   Methadone Scn, Ur NONE DETECTED NONE DETECTED    Comment: (NOTE) Tricyclics + metabolites, urine    Cutoff 1000 ng/mL Amphetamines + metabolites, urine  Cutoff 1000 ng/mL MDMA (Ecstasy), urine              Cutoff 500 ng/mL Cocaine Metabolite, urine          Cutoff 300 ng/mL Opiate + metabolites, urine        Cutoff 300 ng/mL Phencyclidine (PCP), urine         Cutoff 25 ng/mL Cannabinoid, urine                 Cutoff 50 ng/mL Barbiturates + metabolites, urine  Cutoff 200 ng/mL Benzodiazepine, urine              Cutoff 200 ng/mL Methadone, urine                   Cutoff 300 ng/mL The urine drug screen provides only a preliminary, unconfirmed analytical test result and should not be used for non-medical purposes. Clinical consideration and professional judgment should be applied to any positive drug screen result due to possible interfering substances. A more specific alternate chemical  method must be used in order to obtain a confirmed analytical result. Gas chromatography / mass spectrometry (GC/MS) is the preferred confirmat ory method. Performed at [facility], [address] [place], [place] [address]   SARS CORONAVIRUS 2 (TAT 6-24 HRS) Nasopharyngeal Nasopharyngeal Swab     Status: None   Collection Time: 09/16/19  5:32 PM   Specimen: Nasopharyngeal Swab  Result Value Ref Range   SARS Coronavirus 2 NEGATIVE NEGATIVE    Comment: (NOTE) SARS-CoV-2 target nucleic acids are NOT DETECTED. The SARS-CoV-2 RNA is generally detectable in upper and lower respiratory specimens during the acute phase of infection. Negative results do not preclude SARS-CoV-2 infection, do not rule out co-infections with other pathogens, and should not be used as the sole basis for treatment or other patient management decisions. Negative results must be combined with clinical observations, patient history, and epidemiological information. The expected result is Negative. Fact Sheet for Patients: HairSlick.no Fact Sheet for Healthcare Providers: quierodirigir.com This test is  not yet approved or cleared by the [place] and  has been authorized for detection and/or diagnosis of SARS-CoV-2 by FDA under an Emergency Use Authorization (EUA). This EUA will remain  in effect (meaning this test can be used) for the duration of the COVID-19 declaration under Section 56 4(b)(1) of the Act, 21 U.S.C. section 360bbb-3(b)(1), unless the authorization is terminated or revoked sooner. Performed at [facility], [place] [address], [place], [place] [address]     No current facility-administered medications for this encounter.    Current Outpatient Medications  Medication Sig Dispense Refill  . acetaminophen (TYLENOL) 500 MG tablet Take 1,000 mg by mouth daily as needed for headache.    . ARIPiprazole (ABILIFY) 15 MG  tablet Take 15 mg by mouth at bedtime.    [name] atorvastatin (LIPITOR) 10 MG tablet Take 1 tablet by mouth daily.    . baclofen (LIORESAL) 10 MG tablet     . cyclobenzaprine (FLEXERIL) 10 MG tablet cyclobenzaprine 10 mg tablet    . divalproex (DEPAKOTE ER) 500 MG 24 hr tablet Take 1,500 mg by mouth at bedtime. TAKES 3 CAPSULES AT BEDTIME    . gabapentin (NEURONTIN) 300 MG capsule gabapentin 300 mg capsule    . hydrochlorothiazide (MICROZIDE) 12.5 MG capsule Take 1 capsule by mouth daily.    [name] omeprazole (PRILOSEC) 40 MG capsule     . propranolol (INDERAL) 20 MG tablet Take 40 mg by mouth daily. At 10:00 am    . traZODone (DESYREL) 100 MG tablet Take 200 mg by mouth at bedtime.       Musculoskeletal: Strength & Muscle Tone: within normal limits Gait & Station: normal Patient leans: N/A  Psychiatric Specialty Exam: Physical Exam  Nursing note and vitals reviewed. Constitutional: He is oriented to person, place, and time. He appears well-developed and well-nourished.  Neck: Normal range of motion. Neck supple.  Cardiovascular: Normal rate.  Respiratory: Effort normal.  Musculoskeletal: Normal range of motion.  Neurological: He is alert and oriented to person, place, and time.    Review of Systems  Psychiatric/Behavioral: Positive for hallucinations. The patient is nervous/anxious.   All other systems reviewed and are negative.   Blood pressure 130/64, pulse 89, temperature 98.6 F (37 C), temperature source Oral, resp. rate 20, height 5\' 9"  (1.753 m), weight 104.3 kg, SpO2 97 %.Body mass index is 33.97 kg/m.  General Appearance: Bizarre, Casual and Disheveled  Eye Contact:  Poor  Speech:  Slow  Volume:  Decreased  Mood:  Anxious, Euphoric and Irritable  Affect:  Congruent  Thought Process:  Disorganized  Orientation:  Full (Time, Place, and Person)  Thought Content:  Illogical, Delusions and Paranoid Ideation  Suicidal Thoughts:  No  Homicidal Thoughts:  No  Memory:  Immediate;    Fair Recent;   Fair Remote;   Fair  Judgement:  Impaired  Insight:  Lacking  Psychomotor Activity:  Increased  Concentration:  Concentration: Poor and Attention Span: Poor  Recall:  Poor  Fund of Knowledge:  Fair  Language:  Fair  Akathisia:  Negative  Handed:  Right  AIMS (if indicated):     Assets:  Communication Skills Desire for Improvement Intimacy Social Support  ADL's:  Intact  Cognition:  Impaired,  Mild  Sleep:   Good     Treatment Plan Summary: Medication management and Plan Patient meets criteria for psychiatric inpatient admission.  Disposition: Recommend psychiatric Inpatient admission when medically cleared. Supportive therapy provided about ongoing stressors.  Gillermo MurdochJacqueline Aliyyah Riese, NP 09/17/2019 3:48 AM

## 2019-09-17 NOTE — BH Assessment (Signed)
Patient is to be admitted to [facility]U by Dr. Ainsley Spinner.  Attending Physician will be Dr. Mallie Darting.   Patient has been assigned to room 320, by [facility] Charge Nurse Media.    ER staff is aware of the admission:  [facility] Patient's Nurse   Gust Rung. Patient Access.

## 2019-09-17 NOTE — ED Notes (Signed)
Pt. Sleeping in bed at this time.   

## 2019-09-17 NOTE — ED Notes (Signed)
Patient refused shower. 

## 2019-09-18 DIAGNOSIS — F250 Schizoaffective disorder, bipolar type: Principal | ICD-10-CM

## 2019-09-18 DIAGNOSIS — F319 Bipolar disorder, unspecified: Secondary | ICD-10-CM | POA: Diagnosis present

## 2019-09-18 LAB — VALPROIC ACID LEVEL: Valproic Acid Lvl: <10 ug/mL — ABNORMAL LOW (ref 50.0–100.0)

## 2019-09-18 MED ORDER — OLANZAPINE 5 MG PO TBDP
5.0000 mg | ORAL_TABLET | ORAL | Status: AC
  Administered 2019-09-18: 5 mg via ORAL
  Filled 2019-09-18: qty 1

## 2019-09-18 MED ORDER — ARIPIPRAZOLE 5 MG PO TABS
15.0000 mg | ORAL_TABLET | Freq: Every day | ORAL | Status: DC
  Administered 2019-09-18 – 2019-09-23 (×6): 15 mg via ORAL
  Filled 2019-09-18 (×6): qty 1

## 2019-09-18 MED ORDER — DIVALPROEX SODIUM ER 500 MG PO TB24
1000.0000 mg | ORAL_TABLET | Freq: Every day | ORAL | Status: DC
  Administered 2019-09-18: 1000 mg via ORAL
  Filled 2019-09-18: qty 2

## 2019-09-18 MED ORDER — OLANZAPINE 5 MG PO TBDP
10.0000 mg | ORAL_TABLET | Freq: Three times a day (TID) | ORAL | Status: DC | PRN
  Administered 2019-09-20: 10 mg via ORAL
  Filled 2019-09-18 (×2): qty 2

## 2019-09-18 MED ORDER — ZIPRASIDONE MESYLATE 20 MG IM SOLR: 20.0000 mg | INTRAMUSCULAR | Status: DC | PRN

## 2019-09-18 MED ORDER — IBUPROFEN 600 MG PO TABS: 600.0000 mg | ORAL_TABLET | Freq: Four times a day (QID) | ORAL | Status: DC | PRN

## 2019-09-18 MED ORDER — PROPRANOLOL HCL 20 MG PO TABS
40.0000 mg | ORAL_TABLET | Freq: Every day | ORAL | Status: DC
  Administered 2019-09-18 – 2019-09-21 (×3): 40 mg via ORAL
  Filled 2019-09-18 (×6): qty 2

## 2019-09-18 MED ORDER — MELOXICAM 7.5 MG PO TABS
15.0000 mg | ORAL_TABLET | Freq: Every day | ORAL | Status: DC
  Administered 2019-09-18 – 2019-09-23 (×6): 15 mg via ORAL
  Filled 2019-09-18 (×6): qty 2

## 2019-09-18 MED ORDER — CYCLOBENZAPRINE HCL 10 MG PO TABS
5.0000 mg | ORAL_TABLET | Freq: Three times a day (TID) | ORAL | Status: DC
  Administered 2019-09-18 – 2019-09-19 (×4): 5 mg via ORAL
  Filled 2019-09-18 (×4): qty 1

## 2019-09-18 MED ORDER — GABAPENTIN 300 MG PO CAPS
300.0000 mg | ORAL_CAPSULE | Freq: Three times a day (TID) | ORAL | Status: DC
  Administered 2019-09-18 – 2019-09-23 (×12): 300 mg via ORAL
  Filled 2019-09-18 (×13): qty 1

## 2019-09-18 MED ORDER — TRAMADOL HCL 50 MG PO TABS
50.0000 mg | ORAL_TABLET | Freq: Four times a day (QID) | ORAL | Status: DC | PRN
  Administered 2019-09-18 – 2019-09-22 (×7): 50 mg via ORAL
  Filled 2019-09-18 (×7): qty 1

## 2019-09-18 MED ORDER — LORAZEPAM 1 MG PO TABS
1.0000 mg | ORAL_TABLET | ORAL | Status: AC | PRN
  Administered 2019-09-20: 1 mg via ORAL
  Filled 2019-09-18: qty 1

## 2019-09-18 MED ORDER — PANTOPRAZOLE SODIUM 40 MG PO TBEC
40.0000 mg | DELAYED_RELEASE_TABLET | Freq: Every day | ORAL | Status: DC
  Administered 2019-09-18 – 2019-09-23 (×6): 40 mg via ORAL
  Filled 2019-09-18 (×6): qty 1

## 2019-09-18 NOTE — Plan of Care (Signed)
Patient isolative to his room this evening, not observed interacting with staff or peers. Patient short with this writer this evening.   Problem: Education: Goal: Emotional status will improve Outcome: Not Progressing Goal: Mental status will improve Outcome: Not Progressing

## 2019-09-18 NOTE — Progress Notes (Signed)
D: Psychotic  A: Patient remains in bed this am shift . Noted very irritated and  Demanding  To Probation officer . Continue to voice of  Back pain. When writer addressed his pain he stated the Ibuprofen wont do anything . Patient informed of breakfast . Remained  In bed  Noted to toss and turn in bed. Behavior  Related to a  Smaller child .   Writer informed MD of patient's  pain issues Patient later received  Tramadol.  Continue to voice of the medication not doing anything for his pain  Issues .  Patient remained in bed .  Patient refused to  work on issues of concerns , remained in bed , not progressing at present . Patient remained in bed all shift  Out for dinner . affect brighter  Encourage patient participation with unit programming . Instruction  Given on  Medication , verbalize understanding.  R: Voice no other concerns. Staff continue to monitor

## 2019-09-18 NOTE — Progress Notes (Signed)
Patient was in his room upon arrival to the unit. Patient was irritable during assessment stating he was in severe back pain. Patient denies SI/HIAVH, depression and anxiety. Patient isolative to his room this evening. Patient given education. Patient skin assessment performed with Pamala Hurry, RN, no abnormalities found. Patient oriented to the unit and his room. Patient given support and encouragement to be active in his treatment plan. Patient informed to let staff know if there are any issues or problems on the unit. Patient being monitored Q 15 minutes for safety per unit protocol. Patient remains safe on the unit.

## 2019-09-18 NOTE — Plan of Care (Signed)
Patient refused to  work on issues of concerns , remained in bed , not progressing at present . Problem: Education: Goal: Knowledge of [facility] Education information/materials will improve Outcome: Not Progressing Goal: Emotional status will improve Outcome: Not Progressing Goal: Mental status will improve Outcome: Not Progressing Goal: Verbalization of understanding the information provided will improve Outcome: Not Progressing   Problem: Safety: Goal: Periods of time without injury will increase Outcome: Not Progressing   Problem: Activity: Goal: Will verbalize the importance of balancing activity with adequate rest periods Outcome: Not Progressing   Problem: Safety: Goal: Ability to redirect hostility and anger into socially appropriate behaviors will improve Outcome: Not Progressing Goal: Ability to remain free from injury will improve Outcome: Not Progressing

## 2019-09-18 NOTE — H&P (Signed)
Psychiatric Admission Assessment Adult  Patient Identification: Cory Floyd MRN:  [id number] Date of Evaluation:  09/18/2019 Chief Complaint:  bipolar Principal Diagnosis: <principal problem not specified> Diagnosis:  Active Problems:   Psychotic disorder (HCC)   Bipolar disorder without psychotic features (HCC)  History of Present Illness: Patient is seen and examined.  Patient is a 43 year old male with a past psychiatric history significant for either schizoaffective disorder, bipolar disorder or schizophrenia who presented to the [facility] emergency department on 09/16/2019 under involuntary commitment.  According to the notes in the electronic medical record the patient had been having auditory hallucinations and paranoia.  He stated in the emergency room that his phone was sending out "sound waves" of his girlfriend having sex with her ex-boyfriend.  The patient told me that he went to the police station secondary to an electronic chip that was monitoring him.  He also has had delusions about some form of child pornography.  He apparently is followed at the [facility] for peers support medication management.  He stated in the emergency room that he had been compliant with medications, but admitted to me that he had not taken his Depakote in some time.  He is reportedly taking Depakote and Abilify.  He stated his last psychiatric hospitalization was several years ago.  Reportedly it was between 2012 and 2014.  He is currently irritable, paranoid, and agitated.  He was admitted to the hospital for evaluation and stabilization.  Associated Signs/Symptoms: Depression Symptoms:  anhedonia, insomnia, psychomotor agitation, feelings of worthlessness/guilt, difficulty concentrating, hopelessness, suicidal thoughts without plan, anxiety, loss of energy/fatigue, disturbed sleep, (Hypo) Manic Symptoms:  Delusions, Distractibility, Elevated  Mood, Hallucinations, Impulsivity, Irritable Mood, Labiality of Mood, Anxiety Symptoms:  Excessive Worry, Psychotic Symptoms:  Delusions, Hallucinations: Auditory Visual Ideas of Reference, Paranoia, PTSD Symptoms: Negative Total Time spent with patient: 30 minutes  Past Psychiatric History: Patient stated that he had his last psychiatric hospitalization at our facility approximately 10 years ago.  He has been treated with Abilify, Depakote and trazodone.  He is followed as an outpatient at the [facility].  It is unclear about his compliance with medications.  Is the patient at risk to self? Yes.    Has the patient been a risk to self in the past 6 months? Yes.    Has the patient been a risk to self within the distant past? Yes.    Is the patient a risk to others? No.  Has the patient been a risk to others in the past 6 months? No.  Has the patient been a risk to others within the distant past? No.   Prior Inpatient Therapy:   Prior Outpatient Therapy:    Alcohol Screening: 1. How often do you have a drink containing alcohol?: Never 2. How many drinks containing alcohol do you have on a typical day when you are drinking?: 1 or 2 3. How often do you have six or more drinks on one occasion?: Never AUDIT-C Score: 0 4. How often during the last year have you found that you were not able to stop drinking once you had started?: Never 5. How often during the last year have you failed to do what was normally expected from you becasue of drinking?: Never 6. How often during the last year have you needed a first drink in the morning to get yourself going after a heavy drinking session?: Never 7. How often during the last year have you had a feeling of  guilt of remorse after drinking?: Never 8. How often during the last year have you been unable to remember what happened the night before because you had been drinking?: Never 9. Have you or someone else been injured as a result of your  drinking?: No 10. Has a relative or friend or a doctor or another health worker been concerned about your drinking or suggested you cut down?: No Alcohol Use Disorder Identification Test Final Score (AUDIT): 0 Alcohol Brief Interventions/Follow-up: Patient Refused Substance Abuse History in the last 12 months:  Yes.   Consequences of Substance Abuse: Negative Previous Psychotropic Medications: Yes  Psychological Evaluations: Yes  Past Medical History:  Past Medical History:  Diagnosis Date  . 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 Anginal pain (HCC)   . Arthritis   . Bipolar 1 disorder (HCC)   . Depressed   . Dyspnea   . Fatty liver   . Schizophrenia [facility])     Past Surgical History:  Procedure Laterality Date  . COLONOSCOPY  02/05/2014   diverticulosis, hyperplastic polyp x 2  . fracture arm Right   . KNEE SURGERY Right 2013  . NASAL SEPTUM SURGERY    . NASAL TURBINATE REDUCTION Bilateral 12/12/2016   Procedure: TURBINATE REDUCTION/SUBMUCOSAL RESECTION;  Surgeon: Linus Salmons, MD;  Location: [facility] ORS;  Service: ENT;  Laterality: Bilateral;   Family History:  Family History  Problem Relation Age of Onset  . Hepatitis C Mother   . Cancer Mother   . Cancer Maternal Aunt   . Hypertension Maternal Grandmother    Family Psychiatric  History: Noncontributory Tobacco Screening: Have you used any form of tobacco in the last 30 days? (Cigarettes, Smokeless Tobacco, Cigars, and/or Pipes): Yes Tobacco use, Select all that apply: 5 or more cigarettes per day Are you interested in Tobacco Cessation Medications?: No, patient refused Counseled patient on smoking cessation including recognizing danger situations, developing coping skills and basic information about quitting provided: Refused/Declined practical counseling Social History:  Social History   Substance and Sexual Activity  Alcohol Use Yes     Social History   Substance and Sexual Activity  Drug Use No   Comment:  history of cocaine per Dr Mikey Bussing H&P    Additional Social History:                           Allergies:   Allergies  Allergen Reactions  . Carrot [Daucus Carota] Anaphylaxis and Rash  . Penicillins Anaphylaxis and Hives    Has patient had a PCN reaction causing immediate rash, facial/tongue/throat swelling, SOB or lightheadedness with hypotension: Yes Has patient had a PCN reaction causing severe rash involving mucus membranes or skin necrosis: No Has patient had a PCN reaction that required hospitalization Yes Has patient had a PCN reaction occurring within the last 10 years: No If all of the above answers are "NO", then may proceed with Cephalosporin use.    Lab Results:  Results for orders placed or performed during the hospital encounter of 09/16/19 (from the past 48 hour(s))  Comprehensive metabolic panel     Status: Abnormal   Collection Time: 09/16/19  4:20 PM  Result Value Ref Range   Sodium 141 135 - 145 mmol/L   Potassium 3.4 (L) 3.5 - 5.1 mmol/L   Chloride 105 98 - 111 mmol/L   CO2 23 22 - 32 mmol/L   Glucose, Bld 96 70 - 99 mg/dL  BUN 11 6 - 20 mg/dL   Creatinine, Ser 0.980.95 0.61 - 1.24 mg/dL   Calcium 9.3 8.9 - 11.910.3 mg/dL   Total Protein 8.1 6.5 - 8.1 g/dL   Albumin 4.6 3.5 - 5.0 g/dL   AST 37 15 - 41 U/L   ALT 37 0 - 44 U/L   Alkaline Phosphatase 58 38 - 126 U/L   Total Bilirubin 1.0 0.3 - 1.2 mg/dL   GFR calc non Af Amer >60 >60 mL/min   GFR calc Af Amer >60 >60 mL/min   Anion gap 13 5 - 15    Comment: Performed at [facility], [address] [place], [place] [address]  Ethanol     Status: None   Collection Time: 09/16/19  4:20 PM  Result Value Ref Range   Alcohol, Ethyl (B) <10 <10 mg/dL    Comment: (NOTE) Lowest detectable limit for serum alcohol is 10 mg/dL. For medical purposes only. Performed at [facility], [address] [place], [place] [address]   Salicylate level     Status: None   Collection Time:  09/16/19  4:20 PM  Result Value Ref Range   Salicylate Lvl <7.0 2.8 - 30.0 mg/dL    Comment: Performed at [facility], [address] [place], [place] [address]  Acetaminophen level     Status: Abnormal   Collection Time: 09/16/19  4:20 PM  Result Value Ref Range   Acetaminophen (Tylenol), Serum <10 (L) 10 - 30 ug/mL    Comment: (NOTE) Therapeutic concentrations vary significantly. A range of 10-30 ug/mL  may be an effective concentration for many patients. However, some  are best treated at concentrations outside of this range. Acetaminophen concentrations >150 ug/mL at 4 hours after ingestion  and >50 ug/mL at 12 hours after ingestion are often associated with  toxic reactions. Performed at [facility], [address] [place], [place] [address]   cbc     Status: None   Collection Time: 09/16/19  4:20 PM  Result Value Ref Range   WBC 8.6 4.0 - 10.5 K/uL   RBC 4.64 4.22 - 5.81 MIL/uL   Hemoglobin 14.3 13.0 - 17.0 g/dL   HCT 62.943.0 52.839.0 - 41.352.0 %   MCV 92.7 80.0 - 100.0 fL   MCH 30.8 26.0 - 34.0 pg   MCHC 33.3 30.0 - 36.0 g/dL   RDW 24.413.3 01.011.5 - 27.215.5 %   Platelets 218 150 - 400 K/uL   nRBC 0.0 0.0 - 0.2 %    Comment: Performed at [facility], [address] [place], [place] [address]  Urine Drug Screen, Qualitative     Status: Abnormal   Collection Time: 09/16/19  4:20 PM  Result Value Ref Range   Tricyclic, Ur Screen NONE DETECTED NONE DETECTED   Amphetamines, Ur Screen NONE DETECTED NONE DETECTED   MDMA (Ecstasy)Ur Screen NONE DETECTED NONE DETECTED   Cocaine Metabolite,Ur [place] NONE DETECTED NONE DETECTED   Opiate, Ur Screen NONE DETECTED NONE DETECTED   Phencyclidine (PCP) Ur S NONE DETECTED NONE DETECTED   Cannabinoid 50 Ng, Ur [place] NONE DETECTED NONE DETECTED   Barbiturates, Ur Screen NONE DETECTED NONE DETECTED   Benzodiazepine, Ur Scrn POSITIVE (A) NONE DETECTED   Methadone Scn, Ur NONE DETECTED NONE DETECTED    Comment:  (NOTE) Tricyclics + metabolites, urine    Cutoff 1000 ng/mL Amphetamines + metabolites, urine  Cutoff 1000 ng/mL MDMA (Ecstasy), urine  Cutoff 500 ng/mL Cocaine Metabolite, urine          Cutoff 300 ng/mL Opiate + metabolites, urine        Cutoff 300 ng/mL Phencyclidine (PCP), urine         Cutoff 25 ng/mL Cannabinoid, urine                 Cutoff 50 ng/mL Barbiturates + metabolites, urine  Cutoff 200 ng/mL Benzodiazepine, urine              Cutoff 200 ng/mL Methadone, urine                   Cutoff 300 ng/mL The urine drug screen provides only a preliminary, unconfirmed analytical test result and should not be used for non-medical purposes. Clinical consideration and professional judgment should be applied to any positive drug screen result due to possible interfering substances. A more specific alternate chemical method must be used in order to obtain a confirmed analytical result. Gas chromatography / mass spectrometry (GC/MS) is the preferred confirmat ory method. Performed at [facility], [place], [place] [id number]   SARS CORONAVIRUS 2 (TAT 6-24 HRS) Nasopharyngeal Nasopharyngeal Swab     Status: None   Collection Time: 09/16/19  5:32 PM   Specimen: Nasopharyngeal Swab  Result Value Ref Range   SARS Coronavirus 2 NEGATIVE NEGATIVE    Comment: (NOTE) SARS-CoV-2 target nucleic acids are NOT DETECTED. The SARS-CoV-2 RNA is generally detectable in upper and lower respiratory specimens during the acute phase of infection. Negative results do not preclude SARS-CoV-2 infection, do not rule out co-infections with other pathogens, and should not be used as the sole basis for treatment or other patient management decisions. Negative results must be combined with clinical observations, patient history, and epidemiological information. The expected result is Negative. Fact Sheet for Patients: SugarRoll.be Fact Sheet  for Healthcare Providers: https://www.woods-mathews.com/ This test is not yet approved or cleared by the [place] FDA and  has been authorized for detection and/or diagnosis of SARS-CoV-2 by FDA under an Emergency Use Authorization (EUA). This EUA will remain  in effect (meaning this test can be used) for the duration of the COVID-19 declaration under Section 56 4(b)(1) of the Act, 21 U.S.C. section 360bbb-3(b)(1), unless the authorization is terminated or revoked sooner. Performed at [facility], [place] [address] [place], [place] [address]     Blood Alcohol level:  Lab Results  Component Value Date   ETH <10 09/16/2019   ETH <10 61/95/0932    Metabolic Disorder Labs:  No results found for: HGBA1C, MPG No results found for: PROLACTIN No results found for: CHOL, TRIG, HDL, CHOLHDL, VLDL, LDLCALC  Current Medications: Current Facility-Administered Medications  Medication Dose Route Frequency Provider Last Rate Last Dose  . acetaminophen (TYLENOL) tablet 650 mg  650 mg Oral Q6H PRN Caroline Sauger, NP      . alum & mag hydroxide-simeth (MAALOX/MYLANTA) 200-200-20 MG/5ML suspension 30 mL  30 mL Oral Q4H PRN Caroline Sauger, NP      . ARIPiprazole (ABILIFY) tablet 15 mg  15 mg Oral Daily Sharma Covert, MD   15 mg at 09/18/19 0940  . cyclobenzaprine (FLEXERIL) tablet 5 mg  5 mg Oral TID Sharma Covert, MD      . divalproex (DEPAKOTE ER) 24 hr tablet 1,000 mg  1,000 mg Oral Q supper Sharma Covert, MD      . gabapentin (NEURONTIN) capsule 300 mg  300 mg Oral TID Antonieta Pert, MD      . ibuprofen (ADVIL) tablet 600 mg  600 mg Oral Q6H PRN Antonieta Pert, MD      . OLANZapine zydis [facility]) disintegrating tablet 10 mg  10 mg Oral Q8H PRN Antonieta Pert, MD       And  . LORazepam (ATIVAN) tablet 1 mg  1 mg Oral PRN Antonieta Pert, MD       And  . ziprasidone (GEODON) injection 20 mg  20 mg Intramuscular PRN Antonieta Pert, MD      . magnesium hydroxide (MILK OF MAGNESIA) suspension 30 mL  30 mL Oral Daily PRN Gillermo [place], NP      . pantoprazole (PROTONIX) EC tablet 40 mg  40 mg Oral Daily Antonieta Pert, MD   40 mg at 09/18/19 0941  . propranolol (INDERAL) tablet 40 mg  40 mg Oral Daily Antonieta Pert, MD   40 mg at 09/18/19 0940  . traMADol (ULTRAM) tablet 50 mg  50 mg Oral Q6H PRN Antonieta Pert, MD   50 mg at 09/18/19 0942  . traZODone (DESYREL) tablet 200 mg  200 mg Oral QHS PRN Gillermo [place], NP   200 mg at 09/17/19 2045   PTA Medications: Medications Prior to Admission  Medication Sig Dispense Refill Last Dose  . acetaminophen (TYLENOL) 500 MG tablet Take 1,000 mg by mouth daily as needed for headache.     . ARIPiprazole (ABILIFY) 10 MG tablet Take 10 mg by mouth at bedtime.      . divalproex (DEPAKOTE ER) 500 MG 24 hr tablet Take 1,000 mg by mouth at bedtime.      . mirtazapine (REMERON) 15 MG tablet Take 7.5-15 mg by mouth at bedtime.     . naltrexone (DEPADE) 50 MG tablet Take 50 mg by mouth daily.     . propranolol (INDERAL) 10 MG tablet Take 10 mg by mouth 2 (two) times daily. At 10:00 am       Musculoskeletal: Strength & Muscle Tone: within normal limits Gait & Station: normal Patient leans: N/A  Psychiatric Specialty Exam: Physical Exam  Nursing note and vitals reviewed. Constitutional: He appears well-developed and well-nourished.  HENT:  Head: Normocephalic and atraumatic.  Respiratory: Effort normal.  Musculoskeletal:     Thoracic back: He exhibits pain.     Lumbar back: He exhibits pain.  Neurological: He is alert.    ROS  Blood pressure 131/89, pulse 62, temperature (!) 97.4 F (36.3 C), temperature source Oral, resp. rate 18, height 5\' 9"  (1.753 m), weight 104.3 kg, SpO2 98 %.Body mass index is 33.96 kg/m.  General Appearance: Disheveled  Eye Contact:  Fair  Speech:  Pressured  Volume:  Increased  Mood:  Angry, Anxious, Dysphoric and  Irritable  Affect:  Congruent  Thought Process:  Coherent and Descriptions of Associations: Loose  Orientation:  Full (Time, Place, and Person)  Thought Content:  Delusions, Hallucinations: Auditory Visual, Ideas of Reference:   Paranoia Delusions and Paranoid Ideation  Suicidal Thoughts:  Yes.  without intent/plan  Homicidal Thoughts:  No  Memory:  Immediate;   Fair Recent;   Fair Remote;   Fair  Judgement:  Impaired  Insight:  Lacking  Psychomotor Activity:  Increased  Concentration:  Concentration: Fair and Attention Span: Fair  Recall:  of Knowledge:  Fair  Language:  Good  Akathisia:  Negative  Handed:  Right  AIMS (if  indicated):     Assets:  Desire for Improvement Resilience  ADL's:  Intact  Cognition:  WNL  Sleep:  Number of Hours: 6    Treatment Plan Summary: Daily contact with patient to assess and evaluate symptoms and progress in treatment, Medication management and Plan : Patient is seen and examined.  Patient is a 43 year old male with the above-stated past psychiatric history who presented to the [facility] emergency department on 09/16/2019 with paranoia, auditory hallucinations, looseness of associations.  He has a reported history of bipolar disorder versus schizophrenia versus schizoaffective disorder.  He has reportedly been treated with Abilify and Depakote as well as propranolol in the past.  Given his acute agitation I am going to give him 5 mg of Zyprexa right now, and we will restart his Abilify at 15 mg p.o. daily.  The old chart from a year or 2 ago shows that he was taking 1500 mg of Depakote a day.  We will start Depakote ER at 1000 mg p.o. every afternoon meal today.  He also was taking propranolol and I am assuming that is either for akathisia or tremor.  He had been previously taking 40 mg once a day.  He complains of significant musculoskeletal pain, and I am going to write for ibuprofen, Tylenol and as well tramadol.  It  does appear that he had narcotics in the past for this issue.  I will also add Protonix for gastric protection during the course the hospitalization.  His medication list from over a year ago revealed baclofen, cyclobenzaprine, gabapentin, hydrochlorothiazide for hypertension, meloxicam, omeprazole, tramadol and trazodone.  I will also restart the cyclobenzaprine 10 mg p.o. 3 times daily for his back spasms and gabapentin 300 mg p.o. 3 times daily for chronic pain as well as mood stability.  Currently his blood pressure is stable, but if necessary we will readd the hydrochlorothiazide.  Review of his laboratories revealed a mildly low potassium, negative blood alcohol, negative acetaminophen and salicylate.  His drug screen was positive for benzodiazepines, but he denied any illicit drug use or any recent alcohol use.  Hopefully we can get him stabilized and get some of his old records tomorrow when the [employer] and his pharmacy are available.  Observation Level/Precautions:  15 minute checks  Laboratory:  Chemistry Profile  Psychotherapy:    Medications:    Consultations:    Discharge Concerns:    Estimated LOS:  Other:     Physician Treatment Plan for Primary Diagnosis: <principal problem not specified> Long Term Goal(s): Improvement in symptoms so as ready for discharge  Short Term Goals: Ability to identify changes in lifestyle to reduce recurrence of condition will improve, Ability to verbalize feelings will improve, Ability to disclose and discuss suicidal ideas, Ability to demonstrate self-control will improve, Ability to identify and develop effective coping behaviors will improve, Ability to maintain clinical measurements within normal limits will improve and Compliance with prescribed medications will improve  Physician Treatment Plan for Secondary Diagnosis: Active Problems:   Psychotic disorder (HCC)   Bipolar disorder without psychotic features (HCC)  Long Term Goal(s):  Improvement in symptoms so as ready for discharge  Short Term Goals: Ability to identify changes in lifestyle to reduce recurrence of condition will improve, Ability to verbalize feelings will improve, Ability to disclose and discuss suicidal ideas, Ability to demonstrate self-control will improve, Ability to identify and develop effective coping behaviors will improve, Ability to maintain clinical measurements within normal limits will improve  and Compliance with prescribed medications will improve  I certify that inpatient services furnished can reasonably be expected to improve the patient's condition.    Antonieta Pert, MD 11/26/202011:42 AM

## 2019-09-18 NOTE — BHH Suicide Risk Assessment (Signed)
[facility] Admission Suicide Risk Assessment   Nursing information obtained from:  Patient Demographic factors:  Male Current Mental Status:  NA Loss Factors:  NA Historical Factors:  NA Risk Reduction Factors:  NA  Total Time spent with patient: 30 minutes Principal Problem: <principal problem not specified> Diagnosis:  Active Problems:   Psychotic disorder (HCC)   Bipolar disorder without psychotic features (HCC)  Subjective Data: Patient is seen and examined.  Patient is a 43 year old male with a past psychiatric history significant for either schizoaffective disorder, bipolar disorder or schizophrenia who presented to the [facility] emergency department on 09/16/2019 under involuntary commitment.  According to the notes in the electronic medical record the patient had been having auditory hallucinations and paranoia.  He stated in the emergency room that his phone was sending out "sound waves" of his girlfriend having sex with her ex-boyfriend.  The patient told me that he went to the police station secondary to an electronic chip that was monitoring him.  He also has had delusions about some form of child pornography.  He apparently is followed at the [facility] for peers support medication management.  He stated in the emergency room that he had been compliant with medications, but admitted to me that he had not taken his Depakote in some time.  He is reportedly taking Depakote and Abilify.  He stated his last psychiatric hospitalization was several years ago.  Reportedly it was between 2012 and 2014.  He is currently irritable, paranoid, and agitated.  He was admitted to the hospital for evaluation and stabilization.  Continued Clinical Symptoms:  Alcohol Use Disorder Identification Test Final Score (AUDIT): 0 The "Alcohol Use Disorders Identification Test", Guidelines for Use in Primary Care, Second Edition.  World Science writer [facility]). Score between 0-7:  no or  low risk or alcohol related problems. Score between 8-15:  moderate risk of alcohol related problems. Score between 16-19:  high risk of alcohol related problems. Score 20 or above:  warrants further diagnostic evaluation for alcohol dependence and treatment.   CLINICAL FACTORS:   Bipolar Disorder:   Mixed State Schizophrenia:   Paranoid or undifferentiated type   Musculoskeletal: Strength & Muscle Tone: within normal limits Gait & Station: normal Patient leans: N/A  Psychiatric Specialty Exam: Physical Exam  Nursing note and vitals reviewed. Constitutional: He is oriented to person, place, and time. He appears well-developed and well-nourished.  HENT:  Head: Normocephalic and atraumatic.  Respiratory: Effort normal.  Neurological: He is alert and oriented to person, place, and time.    ROS  Blood pressure 131/89, pulse 62, temperature (!) 97.4 F (36.3 C), temperature source Oral, resp. rate 18, height 5\' 9"  (1.753 m), weight 104.3 kg, SpO2 98 %.Body mass index is 33.96 kg/m.  General Appearance: Disheveled  Eye Contact:  Minimal  Speech:  Pressured  Volume:  Increased  Mood:  Anxious, Dysphoric and Irritable  Affect:  Congruent  Thought Process:  Coherent and Descriptions of Associations: Loose  Orientation:  Full (Time, Place, and Person)  Thought Content:  Delusions, Hallucinations: Auditory, Paranoid Ideation and Rumination  Suicidal Thoughts:  No  Homicidal Thoughts:  No  Memory:  Immediate;   Fair Recent;   Fair Remote;   Fair  Judgement:  Impaired  Insight:  Lacking  Psychomotor Activity:  Increased  Concentration:  Concentration: Fair and Attention Span: Fair  Recall:  of Knowledge:  Fair  Language:  Fair  Akathisia:  Negative  Handed:  Right  AIMS (if indicated):     Assets:  Desire for Improvement Resilience  ADL's:  Intact  Cognition:  WNL  Sleep:  Number of Hours: 6      COGNITIVE FEATURES THAT CONTRIBUTE TO RISK:  Thought  constriction (tunnel vision)    SUICIDE RISK:   Moderate:  Frequent suicidal ideation with limited intensity, and duration, some specificity in terms of plans, no associated intent, good self-control, limited dysphoria/symptomatology, some risk factors present, and identifiable protective factors, including available and accessible social support.  PLAN OF CARE: Patient is seen and examined.  Patient is a 43 year old male with the above-stated past psychiatric history who presented to the [facility] emergency department on 09/16/2019 with paranoia, auditory hallucinations, looseness of associations.  He has a reported history of bipolar disorder versus schizophrenia versus schizoaffective disorder.  He has reportedly been treated with Abilify and Depakote as well as propranolol in the past.  Given his acute agitation I am going to give him 5 mg of Zyprexa right now, and we will restart his Abilify at 15 mg p.o. daily.  The old chart from a year or 2 ago shows that he was taking 1500 mg of Depakote a day.  We will start Depakote ER at 1000 mg p.o. every afternoon meal today.  He also was taking propranolol and I am assuming that is either for akathisia or tremor.  He had been previously taking 40 mg once a day.  He complains of significant musculoskeletal pain, and I am going to write for ibuprofen, Tylenol and as well tramadol.  It does appear that he had narcotics in the past for this issue.  I will also add Protonix for gastric protection during the course the hospitalization.  His medication list from over a year ago revealed baclofen, cyclobenzaprine, gabapentin, hydrochlorothiazide for hypertension, meloxicam, omeprazole, tramadol and trazodone.  I will also restart the cyclobenzaprine 10 mg p.o. 3 times daily for his back spasms and gabapentin 300 mg p.o. 3 times daily for chronic pain as well as mood stability.  Currently his blood pressure is stable, but if necessary we will readd  the hydrochlorothiazide.  Review of his laboratories revealed a mildly low potassium, negative blood alcohol, negative acetaminophen and salicylate.  His drug screen was positive for benzodiazepines, but he denied any illicit drug use or any recent alcohol use.  Hopefully we can get him stabilized and get some of his old records tomorrow when the [employer] and his pharmacy are available.  I certify that inpatient services furnished can reasonably be expected to improve the patient's condition.   Sharma Covert, MD 09/18/2019, 9:16 AM

## 2019-09-19 DIAGNOSIS — F250 Schizoaffective disorder, bipolar type: Secondary | ICD-10-CM | POA: Diagnosis not present

## 2019-09-19 MED ORDER — DIVALPROEX SODIUM ER 500 MG PO TB24
1500.0000 mg | ORAL_TABLET | Freq: Every day | ORAL | Status: DC
  Administered 2019-09-19 – 2019-09-22 (×4): 1500 mg via ORAL
  Filled 2019-09-19 (×4): qty 3

## 2019-09-19 MED ORDER — CYCLOBENZAPRINE HCL 10 MG PO TABS
10.0000 mg | ORAL_TABLET | Freq: Three times a day (TID) | ORAL | Status: DC | PRN
  Administered 2019-09-20 (×3): 10 mg via ORAL
  Filled 2019-09-19 (×4): qty 1

## 2019-09-19 NOTE — Progress Notes (Signed)
[facility] MD Progress Note  09/19/2019 12:34 PM Cory Floyd  MRN:  [id number] Subjective: Patient is a 43 year old male with a past psychiatric history significant for schizoaffective disorder; bipolar type, schizophrenia or bipolar disorder.  He presented to the [name] emergency department on 09/16/2019 under involuntary commitment secondary to auditory hallucinations and paranoia.  Objective: Patient is seen and examined.  Patient is a 43 year old male with the above-stated past psychiatric history who is seen in follow-up.  He remains irritable but significantly less than yesterday.  His main focus is his back pain.  I have reinforced with him that he had meloxicam, Tylenol, tramadol and cyclobenzaprine all available for him for his back pain issues.  I was unable to find anything that was documented in the chart outside of some office visits in 2017 for degenerative disease of the lumbar spine.  I was unable to find any imaging of his spine.  His vital signs are stable, he is afebrile.  He slept 6 hours last night.  He is not as irritable, he is not talking about paranoid things as much as yesterday, and denied any auditory or visual hallucinations.  Not surprising his Depakote level on 11/26 was less than 10.  Clearly he had been noncompliant with his medications.  Principal Problem: <principal problem not specified> Diagnosis: Active Problems:   Psychotic disorder (HCC)   Bipolar disorder without psychotic features (HCC)  Total Time spent with patient: 15 minutes  Past Psychiatric History: See admission H&P  Past Medical History:  Past Medical History:  Diagnosis Date  . 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 Anginal pain (HCC)   . Arthritis   . Bipolar 1 disorder (HCC)   . Depressed   . Dyspnea   . Fatty liver   . Schizophrenia [facility])     Past Surgical History:  Procedure Laterality Date  . COLONOSCOPY  02/05/2014   diverticulosis, hyperplastic polyp x 2   . fracture arm Right   . KNEE SURGERY Right 2013  . NASAL SEPTUM SURGERY    . NASAL TURBINATE REDUCTION Bilateral 12/12/2016   Procedure: TURBINATE REDUCTION/SUBMUCOSAL RESECTION;  Surgeon: Linus Salmons, MD;  Location: [facility] ORS;  Service: ENT;  Laterality: Bilateral;   Family History:  Family History  Problem Relation Age of Onset  . Hepatitis C Mother   . Cancer Mother   . Cancer Maternal Aunt   . Hypertension Maternal Grandmother    Family Psychiatric  History: See admission H&P Social History:  Social History   Substance and Sexual Activity  Alcohol Use Yes     Social History   Substance and Sexual Activity  Drug Use No   Comment: history of cocaine per Dr Mikey Bussing H&P    Social History   Socioeconomic History  . Marital status: Divorced    Spouse name: Not on file  . Number of children: Not on file  . Years of education: Not on file  . Highest education level: Not on file  Occupational History  . Not on file  Social Needs  . Financial resource strain: Not on file  . Food insecurity    Worry: Not on file    Inability: Not on file  . Transportation needs    Medical: Not on file    Non-medical: Not on file  Tobacco Use  . Smoking status: Current Every Day Smoker    Packs/day: 1.00    Years: 20.00    Pack years: 20.00  Types: Cigarettes  . Smokeless tobacco: Never Used  Substance and Sexual Activity  . Alcohol use: Yes  . Drug use: No    Comment: history of cocaine per Dr Ileene Hutchinson H&P  . Sexual activity: Yes    Birth control/protection: None  Lifestyle  . Physical activity    Days per week: Not on file    Minutes per session: Not on file  . Stress: Not on file  Relationships  . Social Herbalist on phone: Not on file    Gets together: Not on file    Attends religious service: Not on file    Active member of club or organization: Not on file    Attends meetings of clubs or organizations: Not on file    Relationship status: Not on  file  Other Topics Concern  . Not on file  Social History Narrative  . Not on file   Additional Social History:                         Sleep: Fair  Appetite:  Fair  Current Medications: Current Facility-Administered Medications  Medication Dose Route Frequency Provider Last Rate Last Dose  . acetaminophen (TYLENOL) tablet 650 mg  650 mg Oral Q6H PRN Caroline Sauger, NP      . alum & mag hydroxide-simeth (MAALOX/MYLANTA) 200-200-20 MG/5ML suspension 30 mL  30 mL Oral Q4H PRN Caroline Sauger, NP      . ARIPiprazole (ABILIFY) tablet 15 mg  15 mg Oral Daily Sharma Covert, MD   15 mg at 09/19/19 0900  . cyclobenzaprine (FLEXERIL) tablet 5 mg  5 mg Oral TID Sharma Covert, MD   5 mg at 09/19/19 0900  . divalproex (DEPAKOTE ER) 24 hr tablet 1,000 mg  1,000 mg Oral Q supper Sharma Covert, MD   1,000 mg at 09/18/19 1637  . gabapentin (NEURONTIN) capsule 300 mg  300 mg Oral TID Sharma Covert, MD   300 mg at 09/19/19 0900  . OLANZapine zydis (ZYPREXA) disintegrating tablet 10 mg  10 mg Oral Q8H PRN Sharma Covert, MD       And  . LORazepam (ATIVAN) tablet 1 mg  1 mg Oral PRN Sharma Covert, MD       And  . ziprasidone (GEODON) injection 20 mg  20 mg Intramuscular PRN Sharma Covert, MD      . magnesium hydroxide (MILK OF MAGNESIA) suspension 30 mL  30 mL Oral Daily PRN Caroline Sauger, NP      . meloxicam (MOBIC) tablet 15 mg  15 mg Oral Daily Sharma Covert, MD   15 mg at 09/19/19 0900  . pantoprazole (PROTONIX) EC tablet 40 mg  40 mg Oral Daily Sharma Covert, MD   40 mg at 09/19/19 0900  . propranolol (INDERAL) tablet 40 mg  40 mg Oral Daily Sharma Covert, MD   40 mg at 09/18/19 0940  . traMADol (ULTRAM) tablet 50 mg  50 mg Oral Q6H PRN Sharma Covert, MD   50 mg at 09/19/19 0118  . traZODone (DESYREL) tablet 200 mg  200 mg Oral QHS PRN Caroline Sauger, NP   200 mg at 09/17/19 2045    Lab Results:  Results for  orders placed or performed during the hospital encounter of 09/17/19 (from the past 48 hour(s))  Valproic acid level     Status: Abnormal   Collection Time: 09/18/19 10:14 AM  Result Value Ref Range   Valproic Acid Lvl <10 (L) 50.0 - 100.0 ug/mL    Comment: Performed at [facility], [address] [place], [place] [address]    Blood Alcohol level:  Lab Results  Component Value Date   [facility] <10 09/16/2019   ETH <10 09/18/2018    Metabolic Disorder Labs: No results found for: HGBA1C, MPG No results found for: PROLACTIN No results found for: CHOL, TRIG, HDL, CHOLHDL, VLDL, LDLCALC  Physical Findings: AIMS:  , ,  ,  ,    CIWA:    COWS:     Musculoskeletal: Strength & Muscle Tone: within normal limits Gait & Station: normal Patient leans: N/A  Psychiatric Specialty Exam: Physical Exam  Nursing note and vitals reviewed. Constitutional: He is oriented to person, place, and time. He appears well-developed and well-nourished.  HENT:  Head: Normocephalic and atraumatic.  Respiratory: Effort normal.  Neurological: He is alert and oriented to person, place, and time.    ROS  Blood pressure 96/67, pulse 62, temperature 97.8 F (36.6 C), resp. rate 18, height 5\' 9"  (1.753 m), weight 104.3 kg, SpO2 99 %.Body mass index is 33.96 kg/m.  General Appearance: Disheveled  Eye Contact:  Fair  Speech:  Normal Rate  Volume:  Normal  Mood:  Irritable  Affect:  Congruent  Thought Process:  Coherent and Descriptions of Associations: Circumstantial  Orientation:  Full (Time, Place, and Person)  Thought Content:  Delusions, Paranoid Ideation, Rumination and Tangential  Suicidal Thoughts:  No  Homicidal Thoughts:  No  Memory:  Immediate;   Fair Recent;   Fair Remote;   Fair  Judgement:  Impaired  Insight:  Lacking  Psychomotor Activity:  Increased  Concentration:  Concentration: Fair and Attention Span: Fair  Recall:  [employer]Fair  Fund of Knowledge:  Fair  Language:  Good   Akathisia:  Negative  Handed:  Right  AIMS (if indicated):     Assets:  Desire for Improvement Resilience  ADL's:  Intact  Cognition:  WNL  Sleep:  Number of Hours: 8.25     Treatment Plan Summary: Daily contact with patient to assess and evaluate symptoms and progress in treatment, Medication management and Plan : Patient is seen and examined.  Patient is a 43 year old male with the above-stated past psychiatric history who is seen in follow-up.   Diagnosis: #1 schizoaffective disorder; bipolar type versus bipolar disorder with psychotic features versus schizophrenia, #2 history of chronic back pain, #3 history of diverticulosis  Patient is seen in follow-up.  He is a bit better today.  He is less irritable than he had been.  His delusions seem to be less prominent.  I am going to increase his Depakote ER to 1500 mg p.o. every afternoon.  We will continue the Abilify at its current dosage.  No change in his other medications today. 1.  Continue Abilify 15 mg p.o. daily for mood stability and psychosis. 2.  Increase Flexeril to 10 mg p.o. 3 times daily as needed back pain or spasm. 3.  Increase Depakote ER to 1500 mg p.o. every afternoon for mood stability. 4.  Continue gabapentin 300 mg p.o. 3 times daily for mood stability as well as chronic pain. 5.  Continue meloxicam 15 mg p.o. daily for osteoarthritis. 6.  Continue agitation protocol as needed. 7.  Continue Protonix 40 mg p.o. daily for gastric protection. 8.  Continue propranolol 40 mg p.o. daily for tremor and hypertension. 9.  Continue tramadol 50 mg p.o.  every 6 hours as needed pain. 10.  Continue trazodone 200 mg p.o. nightly as needed insomnia. 11.  Disposition planning-in progress.  Antonieta Pert, MD 09/19/2019, 12:34 PM

## 2019-09-19 NOTE — BHH Group Notes (Signed)
LCSW Group Therapy Note  09/19/2019 1:00 PM  Type of Therapy/Topic:  Group Therapy:  Balance in Life  Participation Level:  Did Not Attend  Description of Group:    This group will address the concept of balance and how it feels and looks when one is unbalanced. Patients will be encouraged to process areas in their lives that are out of balance and identify reasons for remaining unbalanced. Facilitators will guide patients in utilizing problem-solving interventions to address and correct the stressor making their life unbalanced. Understanding and applying boundaries will be explored and addressed for obtaining and maintaining a balanced life. Patients will be encouraged to explore ways to assertively make their unbalanced needs known to significant others in their lives, using other group members and facilitator for support and feedback.  Therapeutic Goals: 1. Patient will identify two or more emotions or situations they have that consume much of in their lives. 2. Patient will identify signs/triggers that life has become out of balance:  3. Patient will identify two ways to set boundaries in order to achieve balance in their lives:  4. Patient will demonstrate ability to communicate their needs through discussion and/or role plays  Summary of Patient Progress: X     Therapeutic Modalities:   Cognitive Behavioral Therapy Solution-Focused Therapy Assertiveness Training  Alayssa Flinchum MSW, LCSW 09/19/2019 12:31 PM 

## 2019-09-19 NOTE — Plan of Care (Signed)
Patient has been irritable this morning. Patient states "I need someone to see me about my back pain." Patient not wanting to engage in conversation with this Probation officer. Patient questions answered about his medications. Patient provided support and encouragement. Q 15 minute checks in progress and patient remains safe on unit. Monitoring continues.   Problem: Safety: Goal: Periods of time without injury will increase Outcome: Progressing   Problem: Safety: Goal: Ability to remain free from injury will improve Outcome: Progressing   Problem: Education: Goal: Emotional status will improve Outcome: Not Progressing Goal: Mental status will improve Outcome: Not Progressing Goal: Verbalization of understanding the information provided will improve Outcome: Not Progressing   Problem: Activity: Goal: Will verbalize the importance of balancing activity with adequate rest periods Outcome: Not Progressing   Problem: Safety: Goal: Ability to redirect hostility and anger into socially appropriate behaviors will improve Outcome: Not Progressing

## 2019-09-19 NOTE — Progress Notes (Signed)
Recreation Therapy Notes  INPATIENT RECREATION THERAPY ASSESSMENT  Patient Details Name: Cory Floyd MRN: [id number] DOB: 07-Nov-1975 Today's Date: 09/19/2019       Information Obtained From: Patient  Able to Participate in Assessment/Interview: Yes  Patient Presentation: Responsive  Reason for Admission (Per Patient): Active Symptoms  Patient Stressors:    Coping Skills:   TV  Leisure Interests (2+):  Individual - TV  Frequency of Recreation/Participation: Weekly  Awareness of Community Resources:     [employer]:     Current Use:    If no, Barriers?:    Expressed Interest in [name] Information:    [employer] of Residence:  Insurance underwriter  Patient Main Form of Transportation: Diplomatic Services operational officer  Patient Strengths:  N/A  Patient Identified Areas of Improvement:  Getting medication for back pain  Patient Goal for Hospitalization:  I do not know  Current SI (including self-harm):  No  Current HI:  No  Current AVH: No  Staff Intervention Plan: Group Attendance, Collaborate with Interdisciplinary Treatment Team  Consent to Intern Participation: N/A  Cory Floyd 09/19/2019, 2:40 PM

## 2019-09-19 NOTE — BHH Suicide Risk Assessment (Signed)
[place] INPATIENT:  Family/Significant Other Suicide Prevention Education  Suicide Prevention Education:  Patient Refusal for Family/Significant Other Suicide Prevention Education: The patient Cory Floyd has refused to provide written consent for family/significant other to be provided Family/Significant Other Suicide Prevention Education during admission and/or prior to discharge.  Physician notified.  Alayna Mabe T Lelan Cush 09/19/2019, 10:22 AM

## 2019-09-19 NOTE — Progress Notes (Signed)
Recreation Therapy Notes  Date: 09/19/2019  Time: 9:30 am   Location: Craft room   Behavioral response: N/A   Intervention Topic: Necessities   Discussion/Intervention: Patient did not attend group.   Clinical Observations/Feedback:  Patient did not attend group.   Astella Desir LRT/CTRS        Raina Sole 09/19/2019 11:06 AM

## 2019-09-19 NOTE — [facility] Counselor (Signed)
Adult Comprehensive Assessment  Patient ID: Cory Floyd, male   DOB: September 16, 1976, 43 y.o.   MRN: [id number]  Information Source: Information source: Patient(Chart review completed. Pt was uncooperative, irritated and guarded during assessment.)  Current Stressors:  Patient states their primary concerns and needs for treatment are:: No response. 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, mining / Lack of resources (include bankruptcy): Limited income Housing / Lack of housing: Unstable housing, pt reports living in a hotel for a "couple of weeks" Chart review reveals "pt sold his house for a large lump sum of money" Physical health (include injuries & life threatening diseases): Pt complains of back pain Substance abuse: Chart review reveals pt admits to using crack cocaine  Living/Environment/Situation:  Living Arrangements: Alone Living conditions (as described by patient or guardian): Pt lives in a hotel How long has patient lived in current situation?: "Couple of weeks" What is atmosphere in current home: Temporary  Family History:  Marital status: Divorced Does patient have children?: (Unknown)  Childhood History:  Additional childhood history information: Pt did not provide any details about his childhood history, unknown Does patient have siblings?: (Unknown) Did patient suffer any verbal/emotional/physical/sexual abuse as a child?: (Unknown) Has patient ever been sexually abused/assaulted/raped as an adolescent or adult?: (Unknown) Witnessed domestic violence?: (Unknown)  Education:  Highest grade of school patient has completed: 12th Currently a Consulting civil engineer?: No Learning disability?: No  Employment/Work Situation:   Employment situation: On disability How  long has patient been on disability: Unknown Did You Receive Any Psychiatric Treatment/Services While in the [employer]?: No Are There Guns or Other Weapons in Your Home?: (Unknown)  Financial Resources:   Financial resources: Insurance claims handler Does patient have a Lawyer or guardian?: No  Alcohol/Substance Abuse:   What has been your use of drugs/alcohol within the last 12 months?: Crack cocaine If attempted suicide, did drugs/alcohol play a role in this?: No If yes, describe treatment: unknown Has alcohol/substance abuse ever caused legal problems?: (Unknown)  Social Support System:   Describe Community Support System: None reported Type of faith/religion: None reported  Leisure/Recreation:   Leisure and Hobbies: "Watch TV"  Strengths/Needs:   What is the patient's perception of their strengths?: No response  Discharge Plan:   Currently receiving community mental health services: Yes (From Whom)([facility]-pt says he receives peer support, medication management) Patient states concerns and preferences for aftercare planning are: Pt reports he would like to resume treatment with his current provider Does patient have access to transportation?: No Does patient have financial barriers related to discharge medications?: (Unknown) Plan for no access to transportation at discharge: CSW will assist with transportation Will patient be returning to same living situation after discharge?: Yes  Summary/Recommendations:   Summary and Recommendations (to be completed by the evaluator): Chart review completed to obtain collateral information due to pt being uncooperative, guarded and irritated. Pt is a 43 yr old male  with a diagnosis of Bipolar disorder without psychotic features brought to the ED due to experiencing hallucinations and paranoia. Pt reports he is currently living in a hotel and has unstable housing. Pt reports being followed by [facility], where he sees  Jennifer(peer support) and Rinaldo Cloud Johnson(therapist). Pt states he would like to resume treatment with his current provider. Chart review reveals history of crack cocaine use. While here,  patient will benefit from crisis stabilization, medication evaluation, group therapy and psychoeducation. In addition, it is recommended that patient remain compliant with the established discharge plan and continue treatment.  Angelique Chevalier Lynelle Smoke. 09/19/2019

## 2019-09-19 NOTE — Tx Team (Signed)
Interdisciplinary Treatment and Diagnostic Plan Update  09/19/2019 Time of Session: [place] MRN: [id number]  Principal Diagnosis: <principal problem not specified>  Secondary Diagnoses: Active Problems:   Psychotic disorder ([place])   Bipolar disorder without psychotic features ([place])   Current Medications:  Current Facility-Administered Medications  Medication Dose Route Frequency Provider Last Rate Last Dose  . acetaminophen (TYLENOL) tablet 650 mg  650 mg Oral Q6H PRN Caroline Sauger, NP      . alum & mag hydroxide-simeth (MAALOX/MYLANTA) 200-200-20 MG/5ML suspension 30 mL  30 mL Oral Q4H PRN Caroline Sauger, NP      . ARIPiprazole (ABILIFY) tablet 15 mg  15 mg Oral Daily Sharma Covert, MD   15 mg at 09/19/19 0900  . cyclobenzaprine (FLEXERIL) tablet 5 mg  5 mg Oral TID Sharma Covert, MD   5 mg at 09/19/19 0900  . divalproex (DEPAKOTE ER) 24 hr tablet 1,000 mg  1,000 mg Oral Q supper Sharma Covert, MD   1,000 mg at 09/18/19 1637  . gabapentin (NEURONTIN) capsule 300 mg  300 mg Oral TID Sharma Covert, MD   300 mg at 09/19/19 0900  . OLANZapine zydis (ZYPREXA) disintegrating tablet 10 mg  10 mg Oral Q8H PRN Sharma Covert, MD       And  . LORazepam (ATIVAN) tablet 1 mg  1 mg Oral PRN Sharma Covert, MD       And  . ziprasidone (GEODON) injection 20 mg  20 mg Intramuscular PRN Sharma Covert, MD      . magnesium hydroxide (MILK OF MAGNESIA) suspension 30 mL  30 mL Oral Daily PRN Caroline Sauger, NP      . meloxicam (MOBIC) tablet 15 mg  15 mg Oral Daily Sharma Covert, MD   15 mg at 09/19/19 0900  . pantoprazole (PROTONIX) EC tablet 40 mg  40 mg Oral Daily Sharma Covert, MD   40 mg at 09/19/19 0900  . propranolol (INDERAL) tablet 40 mg  40 mg Oral Daily Sharma Covert, MD   40 mg at 09/18/19 0940  . traMADol (ULTRAM) tablet 50 mg  50 mg Oral Q6H PRN Sharma Covert, MD   50 mg at 09/19/19 0118  . traZODone  (DESYREL) tablet 200 mg  200 mg Oral QHS PRN Caroline Sauger, NP   200 mg at 09/17/19 2045   PTA Medications: Medications Prior to Admission  Medication Sig Dispense Refill Last Dose  . acetaminophen (TYLENOL) 500 MG tablet Take 1,000 mg by mouth daily as needed for headache.     . ARIPiprazole (ABILIFY) 10 MG tablet Take 10 mg by mouth at bedtime.      . divalproex (DEPAKOTE ER) 500 MG 24 hr tablet Take 1,000 mg by mouth at bedtime.      . mirtazapine (REMERON) 15 MG tablet Take 7.5-15 mg by mouth at bedtime.     . naltrexone (DEPADE) 50 MG tablet Take 50 mg by mouth daily.     . propranolol (INDERAL) 10 MG tablet Take 10 mg by mouth 2 (two) times daily. At 10:00 am       Patient Stressors: Marital or family conflict Medication change or noncompliance  Patient Strengths: Motivation for treatment/growth Supportive family/friends  Treatment Modalities: Medication Management, Group therapy, Case management,  1 to 1 session with clinician, Psychoeducation, Recreational therapy.   Physician Treatment Plan for Primary Diagnosis: <principal problem not specified> Long Term Goal(s): Improvement in symptoms so as  ready for discharge Improvement in symptoms so as ready for discharge   Short Term Goals: Ability to identify changes in lifestyle to reduce recurrence of condition will improve Ability to verbalize feelings will improve Ability to disclose and discuss suicidal ideas Ability to demonstrate self-control will improve Ability to identify and develop effective coping behaviors will improve Ability to maintain clinical measurements within normal limits will improve Compliance with prescribed medications will improve Ability to identify changes in lifestyle to reduce recurrence of condition will improve Ability to verbalize feelings will improve Ability to disclose and discuss suicidal ideas Ability to demonstrate self-control will improve Ability to identify and develop  effective coping behaviors will improve Ability to maintain clinical measurements within normal limits will improve Compliance with prescribed medications will improve  Medication Management: Evaluate patient's response, side effects, and tolerance of medication regimen.  Therapeutic Interventions: 1 to 1 sessions, Unit Group sessions and Medication administration.  Evaluation of Outcomes: Not Met  Physician Treatment Plan for Secondary Diagnosis: Active Problems:   Psychotic disorder ([place])   Bipolar disorder without psychotic features ([place])  Long Term Goal(s): Improvement in symptoms so as ready for discharge Improvement in symptoms so as ready for discharge   Short Term Goals: Ability to identify changes in lifestyle to reduce recurrence of condition will improve Ability to verbalize feelings will improve Ability to disclose and discuss suicidal ideas Ability to demonstrate self-control will improve Ability to identify and develop effective coping behaviors will improve Ability to maintain clinical measurements within normal limits will improve Compliance with prescribed medications will improve Ability to identify changes in lifestyle to reduce recurrence of condition will improve Ability to verbalize feelings will improve Ability to disclose and discuss suicidal ideas Ability to demonstrate self-control will improve Ability to identify and develop effective coping behaviors will improve Ability to maintain clinical measurements within normal limits will improve Compliance with prescribed medications will improve     Medication Management: Evaluate patient's response, side effects, and tolerance of medication regimen.  Therapeutic Interventions: 1 to 1 sessions, Unit Group sessions and Medication administration.  Evaluation of Outcomes: Not Met   RN Treatment Plan for Primary Diagnosis: <principal problem not specified> Long Term Goal(s): Knowledge of disease and  therapeutic regimen to maintain health will improve  Short Term Goals: Ability to verbalize frustration and anger appropriately will improve, Ability to participate in decision making will improve, Ability to verbalize feelings will improve, Ability to identify and develop effective coping behaviors will improve and Compliance with prescribed medications will improve  Medication Management: RN will administer medications as ordered by provider, will assess and evaluate patient's response and provide education to patient for prescribed medication. RN will report any adverse and/or side effects to prescribing provider.  Therapeutic Interventions: 1 on 1 counseling sessions, Psychoeducation, Medication administration, Evaluate responses to treatment, Monitor vital signs and CBGs as ordered, Perform/monitor CIWA, COWS, AIMS and Fall Risk screenings as ordered, Perform wound care treatments as ordered.  Evaluation of Outcomes: Not Met   LCSW Treatment Plan for Primary Diagnosis: <principal problem not specified> Long Term Goal(s): Safe transition to appropriate next level of care at discharge, Engage patient in therapeutic group addressing interpersonal concerns.  Short Term Goals: Engage patient in aftercare planning with referrals and resources  Therapeutic Interventions: Assess for all discharge needs, 1 to 1 time with Social worker, Explore available resources and support systems, Assess for adequacy in community support network, Educate family and significant other(s) on suicide prevention, Complete Psychosocial Assessment, Interpersonal  group therapy.  Evaluation of Outcomes: Not Met   Progress in Treatment: Attending groups: No. Participating in groups: No. Taking medication as prescribed: Yes. Toleration medication: Yes. Family/Significant other contact made: Yes, individual(s) contacted:  pt declined Patient understands diagnosis: No. Discussing patient identified problems/goals with  staff: No. Medical problems stabilized or resolved: Yes. Denies suicidal/homicidal ideation: Yes. Issues/concerns per patient self-inventory: No. Other: NA   New problem(s) identified: No, Describe:  None reported  New Short Term/Long Term Goal(s):Attend outpatient treatment, develop and implement healthy coping methods and take medication as prescribed.  Patient Goals:  "I dont know"  Discharge Plan or Barriers: Pt guarded and agitated during meeting. Pt is homeless, lives in a hotel. Pt is being [facility]. Discharge plan is TBD.  Reason for Continuation of Hospitalization: Medication stabilization  Estimated Length of Stay:1-7 days  Attendees: Patient:Cory Floyd 09/19/2019 10:45 AM  Physician: Myles Lipps 09/19/2019 10:45 AM  Nursing: Rosemary Holms 09/19/2019 10:45 AM  RN Care Manager: 09/19/2019 10:45 AM  Social Worker: Anise Salvo 09/19/2019 10:45 AM  Recreational Therapist: Roanna Epley 09/19/2019 10:45 AM  Other:  09/19/2019 10:45 AM  Other:  09/19/2019 10:45 AM  Other: 09/19/2019 10:45 AM    Scribe for Treatment Team: Yvette Rack, LCSW 09/19/2019 10:45 AM

## 2019-09-20 DIAGNOSIS — F319 Bipolar disorder, unspecified: Secondary | ICD-10-CM | POA: Diagnosis not present

## 2019-09-20 NOTE — Progress Notes (Signed)
Patient is alert and oriented x 4, denies SI/HI/AVH  affect is flat but he brightens upon approach , his thoughts are organized and coherent, interacting appropriately with peers and staff, denies pain and discomfort at this time, 15 minutes safety checks maintained will continue to monitor.

## 2019-09-20 NOTE — [facility] Group Notes (Signed)
CSW did not have group today on Overcoming Obstacles due to patient's have recreational time and unit acuity.    Dove Gresham, MSW, LCSW Clinical Social Worker II  [facility] Phone: [phone] Fax: [phone]  

## 2019-09-20 NOTE — Plan of Care (Signed)
  Problem: Activity: Goal: Will verbalize the importance of balancing activity with adequate rest periods Outcome: Progressing  Patient verbalized importance of balancing activity and rest period.

## 2019-09-20 NOTE — Plan of Care (Signed)
D: Pt during assessments denies SI/HI/AVH. Pt. Describes a dysphoric mood. No observations of responding to internal stimuli. Pt. Endorses chronic pain that he is not satisfied with how it is being managed.   A: Q x 15 minute observation checks were completed for safety. Patient was provided with education.  Patient was given/offered medications per orders. Patient  was encourage to attend groups, participate in unit activities and continue with plan of care. Pt. Chart and plans of care reviewed. Pt. Given support and encouragement.   R: Patient is complaint with medications, but has to have medicine brought to him or else refusing. Pt. Is frequently observed to be irritable and minimal in his engagement. Pt. Continues to verbalize dissatisfaction with pain control, verbalizes he wants, "oxy". Pt. Has been isolative and withdrawn to room all day.                 Problem: Education: Goal: Knowledge of  General Education information/materials will improve 09/20/2019 1848 by Reyes Ivan, RN Outcome: Not Progressing 09/20/2019 1848 by Reyes Ivan, RN Outcome: Not Progressing Goal: Emotional status will improve 09/20/2019 1848 by Reyes Ivan, RN Outcome: Not Progressing 09/20/2019 1848 by Reyes Ivan, RN Outcome: Not Progressing Goal: Mental status will improve 09/20/2019 1848 by Reyes Ivan, RN Outcome: Not Progressing 09/20/2019 1848 by Reyes Ivan, RN Outcome: Not Progressing Goal: Verbalization of understanding the information provided will improve 09/20/2019 1848 by Reyes Ivan, RN Outcome: Not Progressing 09/20/2019 1848 by Reyes Ivan, RN Outcome: Not Progressing   Problem: Safety: Goal: Periods of time without injury will increase 09/20/2019 1848 by Reyes Ivan, RN Outcome: Progressing 09/20/2019 1848 by Reyes Ivan, RN Outcome: Not Progressing   Problem: Activity: Goal: Will verbalize the importance of  balancing activity with adequate rest periods 09/20/2019 1848 by Reyes Ivan, RN Outcome: Not Progressing 09/20/2019 1848 by Reyes Ivan, RN Outcome: Not Progressing   Problem: Safety: Goal: Ability to redirect hostility and anger into socially appropriate behaviors will improve 09/20/2019 1848 by Reyes Ivan, RN Outcome: Not Progressing 09/20/2019 1848 by Reyes Ivan, RN Outcome: Not Progressing Goal: Ability to remain free from injury will improve 09/20/2019 1848 by Reyes Ivan, RN Outcome: Progressing 09/20/2019 1848 by Reyes Ivan, RN Outcome: Not Progressing

## 2019-09-20 NOTE — Progress Notes (Signed)
[facility] MD Progress Note  09/20/2019 5:25 PM Cory Floyd  MRN:  [id number]  Principal Problem: <principal problem not specified> Diagnosis: Active Problems:   Psychotic disorder ([place])   Bipolar disorder without psychotic features ([place])  Total Time spent with patient: 15 minutes  Patient is a 43 year old male with a past psychiatric history significant for schizoaffective disorder; bipolar type, schizophrenia or bipolar disorder.  He presented to the [facility] emergency department on 09/16/2019 under involuntary commitment secondary to auditory hallucinations and paranoia.  Patient seen.  Chart reviewed. Patient discussed with nursing; no overnight events reported.  Subjective: patient reports "my back hurts", "my mood is alright". He is slightly irritable because he thinks he does not receive enough medications for his hurting back. He reports good sleep and appetite. He denies any hallucinations. He denies thoughts of harming self or others. "I am in a lot of back pain, I`ve never told I was suicidal". Reports no side effects from medications.  Objective: patient slept 6h. His vitals are stable.   Past Medical History:  Past Medical History:  Diagnosis Date  . 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 Anginal pain ([place])   . Arthritis   . Bipolar 1 disorder ([place])   . Depressed   . Dyspnea   . Fatty liver   . Schizophrenia [facility])     Past Surgical History:  Procedure Laterality Date  . COLONOSCOPY  02/05/2014   diverticulosis, hyperplastic polyp x 2  . fracture arm Right   . KNEE SURGERY Right 2013  . NASAL SEPTUM SURGERY    . NASAL TURBINATE REDUCTION Bilateral 12/12/2016   Procedure: TURBINATE REDUCTION/SUBMUCOSAL RESECTION;  Surgeon: Beverly Gust, MD;  Location: [facility] ORS;  Service: ENT;  Laterality: Bilateral;   Family History:  Family History  Problem Relation Age of Onset  . Hepatitis C Mother   . Cancer Mother   . Cancer Maternal Aunt   .  Hypertension Maternal Grandmother     Social History:  Social History   Substance and Sexual Activity  Alcohol Use Yes     Social History   Substance and Sexual Activity  Drug Use No   Comment: history of cocaine per Dr Ileene Hutchinson H&P    Social History   Socioeconomic History  . Marital status: Divorced    Spouse name: Not on file  . Number of children: Not on file  . Years of education: Not on file  . Highest education level: Not on file  Occupational History  . Not on file  Social Needs  . Financial resource strain: Not on file  . Food insecurity    Worry: Not on file    Inability: Not on file  . Transportation needs    Medical: Not on file    Non-medical: Not on file  Tobacco Use  . Smoking status: Current Every Day Smoker    Packs/day: 1.00    Years: 20.00    Pack years: 20.00    Types: Cigarettes  . Smokeless tobacco: Never Used  Substance and Sexual Activity  . Alcohol use: Yes  . Drug use: No    Comment: history of cocaine per Dr Ileene Hutchinson H&P  . Sexual activity: Yes    Birth control/protection: None  Lifestyle  . Physical activity    Days per week: Not on file    Minutes per session: Not on file  . Stress: Not on file  Relationships  . Social connections  Talks on phone: Not on file    Gets together: Not on file    Attends religious service: Not on file    Active member of club or organization: Not on file    Attends meetings of clubs or organizations: Not on file    Relationship status: Not on file  Other Topics Concern  . Not on file  Social History Narrative  . Not on file   Additional Social History:                         Sleep: Good  Appetite:  Good  Current Medications: Current Facility-Administered Medications  Medication Dose Route Frequency Provider Last Rate Last Dose  . acetaminophen (TYLENOL) tablet 650 mg  650 mg Oral Q6H PRN Gillermo Murdochhompson, Jacqueline, NP      . alum & mag hydroxide-simeth (MAALOX/MYLANTA)  200-200-20 MG/5ML suspension 30 mL  30 mL Oral Q4H PRN Gillermo Murdochhompson, Jacqueline, NP      . ARIPiprazole (ABILIFY) tablet 15 mg  15 mg Oral Daily Antonieta Pertlary, Greg Lawson, MD   15 mg at 09/20/19 0851  . cyclobenzaprine (FLEXERIL) tablet 10 mg  10 mg Oral TID PRN Antonieta Pertlary, Greg Lawson, MD   10 mg at 09/20/19 1224  . divalproex (DEPAKOTE ER) 24 hr tablet 1,500 mg  1,500 mg Oral Q supper Antonieta Pertlary, Greg Lawson, MD   1,500 mg at 09/19/19 1757  . gabapentin (NEURONTIN) capsule 300 mg  300 mg Oral TID Antonieta Pertlary, Greg Lawson, MD   300 mg at 09/20/19 1224  . magnesium hydroxide (MILK OF MAGNESIA) suspension 30 mL  30 mL Oral Daily PRN Gillermo Murdochhompson, Jacqueline, NP      . meloxicam (MOBIC) tablet 15 mg  15 mg Oral Daily Antonieta Pertlary, Greg Lawson, MD   15 mg at 09/20/19 0851  . OLANZapine zydis (ZYPREXA) disintegrating tablet 10 mg  10 mg Oral Q8H PRN Antonieta Pertlary, Greg Lawson, MD       And  . ziprasidone (GEODON) injection 20 mg  20 mg Intramuscular PRN Antonieta Pertlary, Greg Lawson, MD      . pantoprazole (PROTONIX) EC tablet 40 mg  40 mg Oral Daily Antonieta Pertlary, Greg Lawson, MD   40 mg at 09/20/19 16100852  . propranolol (INDERAL) tablet 40 mg  40 mg Oral Daily Antonieta Pertlary, Greg Lawson, MD   40 mg at 09/20/19 0851  . traMADol (ULTRAM) tablet 50 mg  50 mg Oral Q6H PRN Antonieta Pertlary, Greg Lawson, MD   50 mg at 09/20/19 1224  . traZODone (DESYREL) tablet 200 mg  200 mg Oral QHS PRN Gillermo Murdochhompson, Jacqueline, NP   200 mg at 09/17/19 2045    Lab Results: No results found for this or any previous visit (from the past 48 hour(s)).  Blood Alcohol level:  Lab Results  Component Value Date   ETH <10 09/16/2019   ETH <10 09/18/2018    Metabolic Disorder Labs: No results found for: HGBA1C, MPG No results found for: PROLACTIN No results found for: CHOL, TRIG, HDL, CHOLHDL, VLDL, LDLCALC  Physical Findings: AIMS:  , ,  ,  ,    CIWA:    COWS:     Musculoskeletal: Strength & Muscle Tone: within normal limits Gait & Station: normal Patient leans: N/A  Psychiatric Specialty  Exam: Physical Exam  ROS  Blood pressure 121/80, pulse 79, temperature 98.2 F (36.8 C), temperature source Oral, resp. rate 18, height 5\' 9"  (1.753 m), weight 104.3 kg, SpO2 96 %.Body mass index is 33.96 kg/m.  General Appearance: Disheveled  Eye Contact:  Fair  Speech:  Normal Rate  Volume:  Normal  Mood:  Irritable  Affect:  Congruent  Thought Process:  Coherent and Descriptions of Associations: Circumstantial  Orientation:  Full (Time, Place, and Person)  Thought Content:  Delusions, Paranoid Ideation and Rumination  Suicidal Thoughts:  No  Homicidal Thoughts:  No  Memory:  Immediate;   Fair Recent;   Fair Remote;   Fair  Judgement:  Impaired  Insight:  Lacking  Psychomotor Activity:  Normal  Concentration:  Concentration: Fair and Attention Span: Fair  Recall:  [employer] of Knowledge:  Fair  Language:  Good  Akathisia:  No  Handed:  Right  AIMS (if indicated):     Assets:  Desire for Improvement Resilience  ADL's:  Intact  Cognition:  WNL  Sleep:  Number of Hours: 6     Treatment Plan Summary: Daily contact with patient to assess and evaluate symptoms and progress in treatment  Patient is a 43 year old male with a past psychiatric history significant for schizoaffective disorder; bipolar type, schizophrenia or bipolar disorder.  He presented to the [facility] emergency department on 09/16/2019 under involuntary commitment secondary to auditory hallucinations and paranoia. Today patient seen for follow-up. He is less irritable, although continued to be focused ion his back pain. His mood strabilizer was increased yesterday and it seems to be helpful. Will continue current medications without changes today.  Impression:  Schizoaffective disorder; bipolar type versus bipolar disorder with psychotic features versus schizophrenia,  History of chronic back pain,  History of diverticulosis  Plan: -continue inpatient psych admission; 15-minute  checks; daily contact with patient to assess and evaluate symptoms and progress in treatment; psychoeducation.  -Medications: Continue Abilify 15 mg p.o. daily for mood stability and psychosis. continue Depakote ER to 1500 mg p.o. every afternoon for mood stability. continue gabapentin 300 mg p.o. 3 times daily for mood stability as well as chronic pain. dontinue trazodone 200 mg p.o. nightly as needed insomnia. continue agitation protocol as needed.  continue Protonix 40 mg p.o. daily for gastric protection. continue propranolol 40 mg p.o. daily for tremor and hypertension. continue tramadol 50 mg p.o. every 6 hours as needed pain. contrinue Flexeril to 10 mg p.o. 3 times daily as needed back pain or spasm. continue meloxicam 15 mg p.o. daily for osteoarthritis.  -Disposition planning-in progress    Thalia Party, MD 09/20/2019, 5:25 PM

## 2019-09-20 NOTE — Progress Notes (Signed)
Patient at and around this time is notably agitated and anxious this morning. PRN medicine offered and accepted for comfort and safety of the patient. Will continue to monitor closely.

## 2019-09-20 NOTE — Progress Notes (Signed)
Pt. noticeably calmer in his room resting. Will continue to monitor.

## 2019-09-20 NOTE — Progress Notes (Signed)
Pt. This morning refused additional pain medicine stating, "my pain is too high, I need stronger medicine!". Pt. Redirected and encouraged to take other medicine for pain in additional, but continued to refuse. Will notify treatment team and continue to encourage to take additional medicines.

## 2019-09-21 NOTE — Plan of Care (Signed)
  Problem: Activity: Goal: Will verbalize the importance of balancing activity with adequate rest periods Outcome: Progressing  Patient was appropriate no psychosis noted.

## 2019-09-21 NOTE — Progress Notes (Signed)
[facility] MD Progress Note  09/21/2019 11:38 AM Cory Floyd  MRN:  [id number]  Principal Problem: <principal problem not specified> Diagnosis: Active Problems:   Psychotic disorder ([place])   Bipolar disorder without psychotic features ([place])  Total Time spent with patient: 15 minutes  Patient is a 43 year old male with a past psychiatric history significant for schizoaffective disorder; bipolar type, schizophrenia or bipolar disorder.  He presented to the [facility] emergency department on 09/16/2019 under involuntary commitment secondary to auditory hallucinations and paranoia.  Patient seen.  Chart reviewed. Patient discussed with nursing; no overnight events reported.  Subjective: patient continues to be focused on his back pain.He is still irritable because he thinks he does not receive enough medications for his hurting back. We discussed that Flexeril was added two days ago, patient states "nothing helps". Reports good mood, good sleep and appetite. He denies any hallucinations. He denies thoughts of harming self or others. "I`ve never told I was suicidal". Reports no side effects from medications.  Objective: patient slept 6h. His vitals are stable.   Past Medical History:  Past Medical History:  Diagnosis Date  . 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 Anginal pain ([place])   . Arthritis   . Bipolar 1 disorder ([place])   . Depressed   . Dyspnea   . Fatty liver   . Schizophrenia [facility])     Past Surgical History:  Procedure Laterality Date  . COLONOSCOPY  02/05/2014   diverticulosis, hyperplastic polyp x 2  . fracture arm Right   . KNEE SURGERY Right 2013  . NASAL SEPTUM SURGERY    . NASAL TURBINATE REDUCTION Bilateral 12/12/2016   Procedure: TURBINATE REDUCTION/SUBMUCOSAL RESECTION;  Surgeon: Beverly Gust, MD;  Location: [facility] ORS;  Service: ENT;  Laterality: Bilateral;   Family History:  Family History  Problem Relation Age of Onset  . Hepatitis C  Mother   . Cancer Mother   . Cancer Maternal Aunt   . Hypertension Maternal Grandmother     Social History:  Social History   Substance and Sexual Activity  Alcohol Use Yes     Social History   Substance and Sexual Activity  Drug Use No   Comment: history of cocaine per Dr Ileene Hutchinson H&P    Social History   Socioeconomic History  . Marital status: Divorced    Spouse name: Not on file  . Number of children: Not on file  . Years of education: Not on file  . Highest education level: Not on file  Occupational History  . Not on file  Social Needs  . Financial resource strain: Not on file  . Food insecurity    Worry: Not on file    Inability: Not on file  . Transportation needs    Medical: Not on file    Non-medical: Not on file  Tobacco Use  . Smoking status: Current Every Day Smoker    Packs/day: 1.00    Years: 20.00    Pack years: 20.00    Types: Cigarettes  . Smokeless tobacco: Never Used  Substance and Sexual Activity  . Alcohol use: Yes  . Drug use: No    Comment: history of cocaine per Dr Ileene Hutchinson H&P  . Sexual activity: Yes    Birth control/protection: None  Lifestyle  . Physical activity    Days per week: Not on file    Minutes per session: Not on file  . Stress: Not on file  Relationships  .  Social Musician on phone: Not on file    Gets together: Not on file    Attends religious service: Not on file    Active member of club or organization: Not on file    Attends meetings of clubs or organizations: Not on file    Relationship status: Not on file  Other Topics Concern  . Not on file  Social History Narrative  . Not on file   Additional Social History:                         Sleep: Good  Appetite:  Good  Current Medications: Current Facility-Administered Medications  Medication Dose Route Frequency Provider Last Rate Last Dose  . acetaminophen (TYLENOL) tablet 650 mg  650 mg Oral Q6H PRN Gillermo Murdoch, NP       . alum & mag hydroxide-simeth (MAALOX/MYLANTA) 200-200-20 MG/5ML suspension 30 mL  30 mL Oral Q4H PRN Gillermo Murdoch, NP      . ARIPiprazole (ABILIFY) tablet 15 mg  15 mg Oral Daily Antonieta Pert, MD   15 mg at 09/20/19 0851  . cyclobenzaprine (FLEXERIL) tablet 10 mg  10 mg Oral TID PRN Antonieta Pert, MD   10 mg at 09/20/19 2129  . divalproex (DEPAKOTE ER) 24 hr tablet 1,500 mg  1,500 mg Oral Q supper Antonieta Pert, MD   1,500 mg at 09/20/19 1734  . gabapentin (NEURONTIN) capsule 300 mg  300 mg Oral TID Antonieta Pert, MD   300 mg at 09/20/19 1734  . magnesium hydroxide (MILK OF MAGNESIA) suspension 30 mL  30 mL Oral Daily PRN Gillermo Murdoch, NP      . meloxicam (MOBIC) tablet 15 mg  15 mg Oral Daily Antonieta Pert, MD   15 mg at 09/20/19 0851  . OLANZapine zydis (ZYPREXA) disintegrating tablet 10 mg  10 mg Oral Q8H PRN Antonieta Pert, MD   10 mg at 09/20/19 2129   And  . ziprasidone (GEODON) injection 20 mg  20 mg Intramuscular PRN Antonieta Pert, MD      . pantoprazole (PROTONIX) EC tablet 40 mg  40 mg Oral Daily Antonieta Pert, MD   40 mg at 09/20/19 2197  . propranolol (INDERAL) tablet 40 mg  40 mg Oral Daily Antonieta Pert, MD   40 mg at 09/20/19 0851  . traMADol (ULTRAM) tablet 50 mg  50 mg Oral Q6H PRN Antonieta Pert, MD   50 mg at 09/20/19 2129  . traZODone (DESYREL) tablet 200 mg  200 mg Oral QHS PRN Gillermo Murdoch, NP   200 mg at 09/20/19 2129    Lab Results: No results found for this or any previous visit (from the past 48 hour(s)).  Blood Alcohol level:  Lab Results  Component Value Date   ETH <10 09/16/2019   ETH <10 09/18/2018    Metabolic Disorder Labs: No results found for: HGBA1C, MPG No results found for: PROLACTIN No results found for: CHOL, TRIG, HDL, CHOLHDL, VLDL, LDLCALC  Physical Findings: AIMS:  , ,  ,  ,    CIWA:    COWS:     Musculoskeletal: Strength & Muscle Tone: within normal limits Gait  & Station: normal Patient leans: N/A  Psychiatric Specialty Exam: Physical Exam   ROS   Blood pressure 108/81, pulse 77, temperature (!) 97.4 F (36.3 C), temperature source Oral, resp. rate 18, height 5\' 9"  (1.753 m),  weight 104.3 kg, SpO2 100 %.Body mass index is 33.96 kg/m.  General Appearance: Disheveled  Eye Contact:  Fair  Speech:  Normal Rate  Volume:  Normal  Mood:  Irritable  Affect:  Congruent  Thought Process:  Coherent and Descriptions of Associations: Circumstantial  Orientation:  Full (Time, Place, and Person)  Thought Content:  Delusions, Paranoid Ideation and Rumination  Suicidal Thoughts:  No  Homicidal Thoughts:  No  Memory:  Immediate;   Fair Recent;   Fair Remote;   Fair  Judgement:  Impaired  Insight:  Lacking  Psychomotor Activity:  Normal  Concentration:  Concentration: Fair and Attention Span: Fair  Recall:  [employer]  Fund of Knowledge:  Fair  Language:  Good  Akathisia:  No  Handed:  Right  AIMS (if indicated):     Assets:  Desire for Improvement Resilience  ADL's:  Intact  Cognition:  WNL  Sleep:  Number of Hours: 7.5     Treatment Plan Summary: Daily contact with patient to assess and evaluate symptoms and progress in treatment  Patient is a 43 year old male with a past psychiatric history significant for schizoaffective disorder; bipolar type, schizophrenia or bipolar disorder.  He presented to [facility] department on 09/16/2019 under involuntary commitment secondary to auditory hallucinations and paranoia. Today patient seen for follow-up. He is less irritable, continues to be focused on his back pain. His mood strabilizer was increased two days ago and it seems to be helpful. Will continue current medications without changes today. Regular treatment team will assess the progress tomorrow.  Impression:  Schizoaffective disorder; bipolar type versus bipolar disorder with psychotic features versus  schizophrenia,  History of chronic back pain,  History of diverticulosis  Plan: -continue inpatient psych admission; 15-minute checks; daily contact with patient to assess and evaluate symptoms and progress in treatment; psychoeducation.  -Medications: Continue Abilify 15 mg p.o. daily for mood stability and psychosis. continue Depakote ER to 1500 mg p.o. every afternoon for mood stability. continue gabapentin 300 mg p.o. 3 times daily for mood stability as well as chronic pain. dontinue trazodone 200 mg p.o. nightly as needed insomnia. continue agitation protocol as needed.  continue Protonix 40 mg p.o. daily for gastric protection. continue propranolol 40 mg p.o. daily for tremor and hypertension. continue tramadol 50 mg p.o. every 6 hours as needed pain. contrinue Flexeril to 10 mg p.o. 3 times daily as needed back pain or spasm. continue meloxicam 15 mg p.o. daily for osteoarthritis.  -Disposition planning-in progress    Thalia PartyAlisa Ilamae Geng, MD 09/21/2019, 11:38 AM

## 2019-09-21 NOTE — Progress Notes (Signed)
Patient is now up and had lunch. Patient also came to medication room to receive scheduled morning medication. Patient denied SI/HI/AVH to this Probation officer.

## 2019-09-21 NOTE — [facility] Group Notes (Signed)
[facility] LCSW Group Therapy Note  Date/Time: 09/21/2019 @ 1:30pm  Type of Therapy and Topic:  Group Therapy:  Overcoming Obstacles  Participation Level:  [facility] PARTICIPATION LEVEL: Did Not Attend  Description of Group:    In this group patients will be encouraged to explore what they see as obstacles to their own wellness and recovery. They will be guided to discuss their thoughts, feelings, and behaviors related to these obstacles. The group will process together ways to cope with barriers, with attention given to specific choices patients can make. Each patient will be challenged to identify changes they are motivated to make in order to overcome their obstacles. This group will be process-oriented, with patients participating in exploration of their own experiences as well as giving and receiving support and challenge from other group members.  Therapeutic Goals: 1. Patient will identify personal and current obstacles as they relate to admission. 2. Patient will identify barriers that currently interfere with their wellness or overcoming obstacles.  3. Patient will identify feelings, thought process and behaviors related to these barriers. 4. Patient will identify two changes they are willing to make to overcome these obstacles:    Summary of Patient Progress   Patient did not attend group therapy today.    Therapeutic Modalities:   Cognitive Behavioral Therapy Solution Focused Therapy Motivational Interviewing Relapse Prevention Therapy   Marcellene Shivley, LCSW  

## 2019-09-21 NOTE — Progress Notes (Signed)
Patient is alert and oriented x 4, denies SI/HI/AVH  affect is blunted this evening  He appears irritable and argumentative requesting for more pain medications. Patient was advised about drug frequency and dosage for patient safety butj he wasn't receptive,  his thoughts are organized and coherent, he is not  interacting appropriately with peers and staff, 15 minutes safety checks maintained will continue to monitor.

## 2019-09-21 NOTE — Plan of Care (Signed)
This Probation officer has been unable to fully assess patient at this time. Patient has been asleep, and has not come out for meals or medications. Patient did wake up long enough to answer two questions from this Probation officer. Patient stated that he slept "on and off" last night because of his back pain, in which he rated an "8/10". Patient did not request any medication from this writer because he fell back asleep while this Probation officer was talking to him. Patient remains safe on the unit.   Problem: Education: Goal: Knowledge of [facility] Education information/materials will improve Outcome: Not Progressing Goal: Emotional status will improve Outcome: Not Progressing Goal: Mental status will improve Outcome: Not Progressing Goal: Verbalization of understanding the information provided will improve Outcome: Not Progressing   Problem: Safety: Goal: Periods of time without injury will increase Outcome: Not Progressing   Problem: Activity: Goal: Will verbalize the importance of balancing activity with adequate rest periods Outcome: Not Progressing   Problem: Safety: Goal: Ability to redirect hostility and anger into socially appropriate behaviors will improve Outcome: Not Progressing Goal: Ability to remain free from injury will improve Outcome: Not Progressing

## 2019-09-21 NOTE — Progress Notes (Signed)
Patient is sleeping hard at the time of morning medications, so this writer will administer medication once he awakes. MD notified.

## 2019-09-22 DIAGNOSIS — F319 Bipolar disorder, unspecified: Secondary | ICD-10-CM | POA: Diagnosis not present

## 2019-09-22 NOTE — Progress Notes (Signed)
Recreation Therapy Notes  Date: 09/22/2019  Time: 9:30 am   Location: Craft room   Behavioral response: N/A   Intervention Topic: Stress  Discussion/Intervention: Patient did not attend group.   Clinical Observations/Feedback:  Patient did not attend group.   Avalon Coppinger LRT/CTRS        Cory Floyd 09/22/2019 11:26 AM 

## 2019-09-22 NOTE — Progress Notes (Signed)
[facility]  09/22/2019 6:20 PM Cory Floyd  MRN:  [id number] Subjective: Follow-up for this patient with substance abuse and bipolar disorder.  Patient seen chart reviewed.  Patient seems to be significantly calmer and more lucid than when he came in although he still has some odd thoughts about his telephone.  He is not hostile or threatening not suicidal.  He is sleeping better and has been compliant with medication.  Showing better insight.  Says that all he wants is to get back on his medicine and reconnect with his peers support counselor Principal Problem: Bipolar disorder without psychotic features (HCC) Diagnosis: Principal Problem:   Bipolar disorder without psychotic features (HCC) Active Problems:   Psychotic disorder (HCC)  Total Time spent with patient: 30 minutes  Past Psychiatric History: Past history of substance abuse and bipolar disorder  Past Medical History:  Past Medical History:  Diagnosis Date  . 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 Anginal pain (HCC)   . Arthritis   . Bipolar 1 disorder (HCC)   . Depressed   . Dyspnea   . Fatty liver   . Schizophrenia [facility](HCC)     Past Surgical History:  Procedure Laterality Date  . COLONOSCOPY  02/05/2014   diverticulosis, hyperplastic polyp x 2  . fracture arm Right   . KNEE SURGERY Right 2013  . NASAL SEPTUM SURGERY    . NASAL TURBINATE REDUCTION Bilateral 12/12/2016   Procedure: TURBINATE REDUCTION/SUBMUCOSAL RESECTION;  Surgeon: Linus Salmonshapman [name], MD;  Location: [facility] ORS;  Service: ENT;  Laterality: Bilateral;   Family History:  Family History  Problem Relation Age of Onset  . Hepatitis C Mother   . Cancer Mother   . Cancer Maternal Aunt   . Hypertension Maternal Grandmother    Family Psychiatric  History: See previous Social History:  Social History   Substance and Sexual Activity  Alcohol Use Yes     Social History   Substance and Sexual Activity  Drug Use No   Comment: history of  cocaine per Dr Mikey BussingMcQueen's H&P    Social History   Socioeconomic History  . Marital status: Divorced    Spouse name: Not on file  . Number of children: Not on file  . Years of education: Not on file  . Highest education level: Not on file  Occupational History  . Not on file  Social Needs  . Financial resource strain: Not on file  . Food insecurity    Worry: Not on file    Inability: Not on file  . Transportation needs    Medical: Not on file    Non-medical: Not on file  Tobacco Use  . Smoking status: Current Every Day Smoker    Packs/day: 1.00    Years: 20.00    Pack years: 20.00    Types: Cigarettes  . Smokeless tobacco: Never Used  Substance and Sexual Activity  . Alcohol use: Yes  . Drug use: No    Comment: history of cocaine per Dr Mikey BussingMcQueen's H&P  . Sexual activity: Yes    Birth control/protection: None  Lifestyle  . Physical activity    Days per week: Not on file    Minutes per session: Not on file  . Stress: Not on file  Relationships  . Social Musicianconnections    Talks on phone: Not on file    Gets together: Not on file    Attends religious service: Not on file    Active  member of club or organization: Not on file    Attends meetings of clubs or organizations: Not on file    Relationship status: Not on file  Other Topics Concern  . Not on file  Social History Narrative  . Not on file   Additional Social History:                         Sleep: Fair  Appetite:  Fair  Current Medications: Current Facility-Administered Medications  Medication Dose Route Frequency Provider Last Rate Last Dose  . acetaminophen (TYLENOL) tablet 650 mg  650 mg Oral Q6H PRN Caroline Sauger, NP      . alum & mag hydroxide-simeth (MAALOX/MYLANTA) 200-200-20 MG/5ML suspension 30 mL  30 mL Oral Q4H PRN Caroline Sauger, NP      . ARIPiprazole (ABILIFY) tablet 15 mg  15 mg Oral Daily Sharma Covert, MD   15 mg at 09/22/19 1535  . cyclobenzaprine (FLEXERIL)  tablet 10 mg  10 mg Oral TID PRN Sharma Covert, MD   10 mg at 09/20/19 2129  . divalproex (DEPAKOTE ER) 24 hr tablet 1,500 mg  1,500 mg Oral Q supper Sharma Covert, MD   1,500 mg at 09/22/19 1633  . gabapentin (NEURONTIN) capsule 300 mg  300 mg Oral TID Sharma Covert, MD   300 mg at 09/22/19 1536  . magnesium hydroxide (MILK OF MAGNESIA) suspension 30 mL  30 mL Oral Daily PRN Caroline Sauger, NP      . meloxicam (MOBIC) tablet 15 mg  15 mg Oral Daily Sharma Covert, MD   15 mg at 09/22/19 1535  . OLANZapine zydis (ZYPREXA) disintegrating tablet 10 mg  10 mg Oral Q8H PRN Sharma Covert, MD   10 mg at 09/20/19 2129   And  . ziprasidone (GEODON) injection 20 mg  20 mg Intramuscular PRN Sharma Covert, MD      . pantoprazole (PROTONIX) EC tablet 40 mg  40 mg Oral Daily Sharma Covert, MD   40 mg at 09/22/19 1535  . propranolol (INDERAL) tablet 40 mg  40 mg Oral Daily Sharma Covert, MD   40 mg at 09/21/19 1521  . traMADol (ULTRAM) tablet 50 mg  50 mg Oral Q6H PRN Sharma Covert, MD   50 mg at 09/22/19 1535  . traZODone (DESYREL) tablet 200 mg  200 mg Oral QHS PRN Caroline Sauger, NP   200 mg at 09/20/19 2129    Lab Results: No results found for this or any previous visit (from the past 48 hour(s)).  Blood Alcohol level:  Lab Results  Component Value Date   ETH <10 09/16/2019   ETH <10 27/12/5007    Metabolic Disorder Labs: No results found for: HGBA1C, MPG No results found for: PROLACTIN No results found for: CHOL, TRIG, HDL, CHOLHDL, VLDL, LDLCALC  Physical Findings: AIMS:  , ,  ,  ,    CIWA:    COWS:     Musculoskeletal: Strength & Muscle Tone: within normal limits Gait & Station: normal Patient leans: N/A  Psychiatric Specialty Exam: Physical Exam  Nursing note and vitals reviewed. Constitutional: He appears well-developed and well-nourished.  HENT:  Head: Normocephalic and atraumatic.  Eyes: Pupils are equal, round, and  reactive to light. Conjunctivae are normal.  Neck: Normal range of motion.  Cardiovascular: Regular rhythm and normal heart sounds.  Respiratory: Effort normal. No respiratory distress.  GI: Soft.  Musculoskeletal: Normal  range of motion.  Neurological: He is alert.  Skin: Skin is warm and dry.  Psychiatric: He has a normal mood and affect. His speech is normal and behavior is normal. Judgment and thought content normal. Cognition and memory are normal.    Review of Systems  Constitutional: Negative.   HENT: Negative.   Eyes: Negative.   Respiratory: Negative.   Cardiovascular: Negative.   Gastrointestinal: Negative.   Musculoskeletal: Negative.   Skin: Negative.   Neurological: Negative.   Psychiatric/Behavioral: Negative.     Blood pressure 95/75, pulse (!) 115, temperature 97.7 F (36.5 C), temperature source Oral, resp. rate 16, height 5\' 9"  (1.753 m), weight 104.3 kg, SpO2 100 %.Body mass index is 33.96 kg/m.  General Appearance: Casual  Eye Contact:  Fair  Speech:  Clear and Coherent  Volume:  Normal  Mood:  Euthymic  Affect:  Congruent  Thought Process:  Goal Directed  Orientation:  Full (Time, Place, and Person)  Thought Content:  Logical  Suicidal Thoughts:  No  Homicidal Thoughts:  No  Memory:  Immediate;   Fair Recent;   Fair Remote;   Fair  Judgement:  Fair  Insight:  Fair  Psychomotor Activity:  Normal  Concentration:  Concentration: Fair  Recall:  of Knowledge:  Fair  Language:  Fair  Akathisia:  No  Handed:  Right  AIMS (if indicated):     Assets:  Desire for Improvement  ADL's:  Intact  Cognition:  WNL  Sleep:  Number of Hours: 6.5     Treatment Plan Summary: Daily contact with patient to assess and evaluate symptoms and progress in treatment, Medication management and Plan Patient is doing better.  Recheck Depakote level tomorrow and then reassess for possible discharge within the next 1 to 2 days.  [employer], MD 09/22/2019,  6:20 PM

## 2019-09-22 NOTE — Progress Notes (Signed)
This patient has been in his room the entire shift. I went to check on him and he was asleep. Will continue to monitor.

## 2019-09-22 NOTE — Progress Notes (Signed)
Patient is asleep, once again, at the time of morning medications, so this writer will administer medications once he wakes up. MD will be notified.

## 2019-09-22 NOTE — BHH Group Notes (Signed)
LCSW Group Therapy Note   09/22/2019 11:29 AM   Type of Therapy and Topic:  Group Therapy:  Overcoming Obstacles   Participation Level:  Did Not Attend   Description of Group:    In this group patients will be encouraged to explore what they see as obstacles to their own wellness and recovery. They will be guided to discuss their thoughts, feelings, and behaviors related to these obstacles. The group will process together ways to cope with barriers, with attention given to specific choices patients can make. Each patient will be challenged to identify changes they are motivated to make in order to overcome their obstacles. This group will be process-oriented, with patients participating in exploration of their own experiences as well as giving and receiving support and challenge from other group members.   Therapeutic Goals: 1. Patient will identify personal and current obstacles as they relate to admission. 2. Patient will identify barriers that currently interfere with their wellness or overcoming obstacles.  3. Patient will identify feelings, thought process and behaviors related to these barriers. 4. Patient will identify two changes they are willing to make to overcome these obstacles:      Summary of Patient Progress x     Therapeutic Modalities:   Cognitive Behavioral Therapy Solution Focused Therapy Motivational Interviewing Relapse Prevention Therapy  Evalina Field, MSW, LCSW Clinical Social Work 09/22/2019 11:29 AM

## 2019-09-22 NOTE — Plan of Care (Signed)
D- Patient alert and oriented. Patient presented in a pleasant mood on assessment stating that he slept "on and off" again last night because of his chronic back pain. Patient rated his pain level a "7/10", in which he did request pain medication from this Probation officer. Patient denies anxiety, however, he did endorse depression, rating it a "2/10", on his self-inventory, stating "I'm in here" as to why he's feeling this way. Patient also denies SI, HI, AVH at this time. Patient's goal for today is "peacefulness", in which he will "pray", in order to accomplish his goal.  A- Scheduled medications administered to patient, per MD orders. Support and encouragement provided.  Routine safety checks conducted every 15 minutes.  Patient informed to notify staff with problems or concerns.  R- No adverse drug reactions noted. Patient contracts for safety at this time. Patient compliant with medications and treatment plan. Patient receptive, calm, and cooperative. Patient interacts well with others on the unit.  Patient remains safe at this time.  Problem: Education: Goal: Knowledge of [place] [facility] Education information/materials will improve Outcome: Progressing Goal: Emotional status will improve Outcome: Progressing Goal: Mental status will improve Outcome: Progressing Goal: Verbalization of understanding the information provided will improve Outcome: Progressing   Problem: Safety: Goal: Periods of time without injury will increase Outcome: Progressing   Problem: Activity: Goal: Will verbalize the importance of balancing activity with adequate rest periods Outcome: Progressing   Problem: Safety: Goal: Ability to redirect hostility and anger into socially appropriate behaviors will improve Outcome: Progressing Goal: Ability to remain free from injury will improve Outcome: Progressing

## 2019-09-23 DIAGNOSIS — F319 Bipolar disorder, unspecified: Secondary | ICD-10-CM | POA: Diagnosis not present

## 2019-09-23 LAB — VALPROIC ACID LEVEL: Valproic Acid Lvl: 61 ug/mL (ref 50.0–100.0)

## 2019-09-23 MED ORDER — PROPRANOLOL HCL 40 MG PO TABS: 40.0000 mg | ORAL_TABLET | Freq: Every day | ORAL | 0 refills | Status: DC

## 2019-09-23 MED ORDER — GABAPENTIN 300 MG PO CAPS: 300.0000 mg | ORAL_CAPSULE | Freq: Three times a day (TID) | ORAL | 0 refills | Status: DC

## 2019-09-23 MED ORDER — DIVALPROEX SODIUM ER 500 MG PO TB24: 1500.0000 mg | ORAL_TABLET | Freq: Every day | ORAL | 0 refills | Status: DC

## 2019-09-23 MED ORDER — ARIPIPRAZOLE 15 MG PO TABS: 15.0000 mg | ORAL_TABLET | Freq: Every day | ORAL | 0 refills | Status: DC

## 2019-09-23 MED ORDER — MELOXICAM 15 MG PO TABS: 15.0000 mg | ORAL_TABLET | Freq: Every day | ORAL | 0 refills | Status: DC

## 2019-09-23 MED ORDER — TRAZODONE HCL 100 MG PO TABS: 200.0000 mg | ORAL_TABLET | Freq: Every evening | ORAL | 0 refills | Status: DC | PRN

## 2019-09-23 NOTE — Progress Notes (Signed)
Recreation Therapy Notes  Date: 09/23/2019  Time: 9:30 am   Location: Craft room   Behavioral response: N/A   Intervention Topic: Strengths   Discussion/Intervention: Patient did not attend group.   Clinical Observations/Feedback:  Patient did not attend group.   [name] LRT/CTRS        Cory Floyd 09/23/2019 11:23 AM 

## 2019-09-23 NOTE — BHH Suicide Risk Assessment (Signed)
[facility] Discharge Suicide Risk Assessment   Principal Problem: Bipolar disorder without psychotic features [facility]) Discharge Diagnoses: Principal Problem:   Bipolar disorder without psychotic features ([place]) Active Problems:   Psychotic disorder ([place])   Total Time spent with patient: 30 minutes  Musculoskeletal: Strength & Muscle Tone: within normal limits Gait & Station: normal Patient leans: N/A  Psychiatric Specialty Exam: Review of Systems  Constitutional: Negative.   HENT: Negative.   Eyes: Negative.   Respiratory: Negative.   Cardiovascular: Negative.   Gastrointestinal: Negative.   Musculoskeletal: Negative.   Skin: Negative.   Neurological: Negative.   Psychiatric/Behavioral: Negative.     Blood pressure 95/75, pulse (!) 115, temperature 97.7 F (36.5 C), temperature source Oral, resp. rate 16, height 5\' 9"  (1.753 m), weight 104.3 kg, SpO2 100 %.Body mass index is 33.96 kg/m.  General Appearance: Casual  Eye Contact::  Good  Speech:  Clear and Coherent409  Volume:  Normal  Mood:  Euthymic  Affect:  Appropriate  Thought Process:  Coherent  Orientation:  Full (Time, Place, and Person)  Thought Content:  Logical  Suicidal Thoughts:  No  Homicidal Thoughts:  No  Memory:  Immediate;   Fair Recent;   Fair Remote;   Fair  Judgement:  Fair  Insight:  Fair  Psychomotor Activity:  Normal  Concentration:  Fair  Recall:  [employer] [place]  Language: Fair  Akathisia:  No  Handed:  Right  AIMS (if indicated):     Assets:  Desire for Improvement Housing Physical Health Resilience  Sleep:  Number of Hours: 7  Cognition: WNL  ADL's:  Intact   Mental Status Per Nursing Assessment::   On Admission:  NA  Demographic Factors:  Male  Loss Factors: NA  Historical Factors: Impulsivity  Risk Reduction Factors:   Religious beliefs about death, Positive social support and Positive therapeutic relationship  Continued Clinical Symptoms:  Bipolar  Disorder:   Mixed State  Cognitive Features That Contribute To Risk:  None    Suicide Risk:  Minimal: No identifiable suicidal ideation.  Patients presenting with no risk factors but with morbid ruminations; may be classified as minimal risk based on the severity of the depressive symptoms  Follow-up [place] Follow up on 09/30/2019.   Why: You have a therapy appointment scheduled with Pam for 09/30/2019 at 11:00AM, it will be over the phone. You have a medication management appointment with Adele Barthel on 47/42/5956 at 10am in person. Thank You! Contact information: [place] [address] [phone]           Plan Of Care/Follow-up recommendations:  Activity:  Activity as tolerated Diet:  Regular diet Other:  Follow-up with outpatient treatment through [place], MD 09/23/2019, 10:28 AM

## 2019-09-23 NOTE — Progress Notes (Signed)
  [facility] Adult Case Management Discharge Plan :  Will you be returning to the same living situation after discharge:  Yes,  lives in hotel At discharge, do you have transportation home?: Yes,  bus pass Do you have the ability to pay for your medications: Yes,  medicaid  Release of information consent forms completed and in the chart;  Patient's signature needed at discharge.  Patient to Follow up at: Follow-up [facility] Follow up on 09/30/2019.   Why: You have a therapy appointment scheduled with Pam for 09/30/2019 at 11:00AM, it will be over the phone. You have a medication management appointment with Adele Barthel on 04/88/8916 at 10am in person. Thank You! Contact information: [place] [address] [phone]           Next level of care provider has access to [place] and Suicide Prevention discussed: Yes,  with pt; declined family contact  Have you used any form of tobacco in the last 30 days? (Cigarettes, Smokeless Tobacco, Cigars, and/or Pipes): Yes  Has patient been referred to the Quitline?: Patient refused referral  Patient has been referred for addiction treatment: Pt. refused referral  Yvette Rack, LCSW 09/23/2019, 10:38 AM

## 2019-09-23 NOTE — Progress Notes (Signed)
Recreation Therapy Notes  INPATIENT RECREATION TR PLAN  Patient Details Name: Cory Floyd MRN: [id number] DOB: January 07, 1976 Today's Date: 09/23/2019  Rec Therapy Plan Is patient appropriate for Therapeutic Recreation?: Yes Treatment times per week: at least 3 Estimated Length of Stay: 5-7 days TR Treatment/Interventions: Group participation (Comment)  Discharge Criteria Pt will be discharged from therapy if:: Discharged Treatment plan/goals/alternatives discussed and agreed upon by:: Patient/family  Discharge Summary Short term goals set: Patient will engage in groups without prompting or encouragement from LRT x3 group sessions within 5 recreation therapy group sessions Short term goals met: Not met Reason goals not met: Patient did not attend any groups Therapeutic equipment acquired: N/A Reason patient discharged from therapy: Discharge from hospital Pt/family agrees with progress & goals achieved: Yes Date patient discharged from therapy: 09/23/19   Preslynn Bier 09/23/2019, 2:21 PM

## 2019-09-23 NOTE — Plan of Care (Signed)
  Problem: Group Participation Goal: STG - Patient will engage in groups without prompting or encouragement from LRT x3 group sessions within 5 recreation therapy group sessions Description: STG - Patient will engage in groups without prompting or encouragement from LRT x3 group sessions within 5 recreation therapy group sessions 09/23/2019 1420 by Ernest Haber, LRT Outcome: Not Applicable 17/06/2177 [id number] by Ernest Haber, LRT Outcome: Not Met (add Reason) Note: Patient did not attend any groups

## 2019-09-23 NOTE — Plan of Care (Signed)
  Problem: Education: Goal: Knowledge of [place] [facility] Education information/materials will improve Outcome: Adequate for Discharge Goal: Emotional status will improve Outcome: Adequate for Discharge Goal: Mental status will improve Outcome: Adequate for Discharge Goal: Verbalization of understanding the information provided will improve Outcome: Adequate for Discharge   Problem: Safety: Goal: Periods of time without injury will increase Outcome: Adequate for Discharge   Problem: Activity: Goal: Will verbalize the importance of balancing activity with adequate rest periods Outcome: Adequate for Discharge   Problem: Safety: Goal: Periods of time without injury will increase Outcome: Adequate for Discharge   Problem: Activity: Goal: Will verbalize the importance of balancing activity with adequate rest periods Outcome: Adequate for Discharge   Problem: Safety: Goal: Ability to redirect hostility and anger into socially appropriate behaviors will improve Outcome: Adequate for Discharge Goal: Ability to remain free from injury will improve Outcome: Adequate for Discharge

## 2019-09-23 NOTE — Progress Notes (Signed)
D: Patient is aware of  Discharge this shift .  A:Patient denies suicidal /homicidal ideations. Patient received all belongings brought in  Received Storage  Of wallet and cards. Writer reviewed Discharge Summary, Suicide Risk Assessment, and Transitional Record. Patient also received Prescriptions   from  MD.  Which were called into pharmacy  Aware  Of follow up appointment .  R: Patient left unit with no questions  Or concerns  With friend.

## 2019-09-23 NOTE — Discharge Summary (Signed)
Physician Discharge Summary Note  Patient:  Cory Floyd is an 43 y.o., male MRN:  [id number] DOB:  [birth year] Patient phone:  [phone] (home)  Patient address:   [address] [place] [address],  Total Time spent with patient: 30 minutes  Date of Admission:  09/17/2019 Date of Discharge: 09/23/2019  Reason for Admission:  Patient is seen and examined. Patient is a 43 year old male with a past psychiatric history significant for either schizoaffective disorder,bipolar disorder or schizophrenia who presented to the [facility] emergency department on 09/16/2019 under involuntary commitment. According to the notes in the electronic medical record the patient had been having auditory hallucinations and paranoia. He stated in the emergency room that his phone was sending out "sound waves" of his girlfriend having sex with her ex-boyfriend. The patient told me that he went to the police station secondary to an electronic chip that was monitoring him. He also has had delusions about some form of child pornography. He apparently is followed at the [facility] for peers support medication management. He stated in the emergency room that he had been compliant with medications, but admitted to me that he had not taken his Depakote in some time. He is reportedly taking Depakote and Abilify. He stated his last psychiatric hospitalization was several years ago. Reportedly it was between 2012 and 2014. He is currently irritable, paranoid, and agitated. He was admitted to the hospital for evaluation and stabilization.  Associated Signs/Symptoms: Depression Symptoms:  anhedonia, insomnia, psychomotor agitation, feelings of worthlessness/guilt, difficulty concentrating, hopelessness, suicidal thoughts without plan, anxiety, loss of energy/fatigue, disturbed sleep, (Hypo) Manic Symptoms:  Delusions, Distractibility, Elevated  Mood, Hallucinations, Impulsivity, Irritable Mood, Labiality of Mood, Anxiety Symptoms:  Excessive Worry, Psychotic Symptoms:  Delusions, Hallucinations: Auditory Visual Ideas of Reference, Paranoia, PTSD Symptoms: Negative   Past Psychiatric History: Patient stated that he had his last psychiatric hospitalization at our facility approximately 10 years ago.  He has been treated with Abilify, Depakote and trazodone.  He is followed as an outpatient at the [facility].  It is unclear about his compliance with medications.  Principal Problem: Bipolar disorder without psychotic features [facility]) Discharge Diagnoses: Principal Problem:   Bipolar disorder without psychotic features (HCC) Active Problems:   Psychotic disorder [facility])   Past Medical History:  Past Medical History:  Diagnosis Date  . 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 Anginal pain (HCC)   . Arthritis   . Bipolar 1 disorder (HCC)   . Depressed   . Dyspnea   . Fatty liver   . Schizophrenia [facility])     Past Surgical History:  Procedure Laterality Date  . COLONOSCOPY  02/05/2014   diverticulosis, hyperplastic polyp x 2  . fracture arm Right   . KNEE SURGERY Right 2013  . NASAL SEPTUM SURGERY    . NASAL TURBINATE REDUCTION Bilateral 12/12/2016   Procedure: TURBINATE REDUCTION/SUBMUCOSAL RESECTION;  Surgeon: Linus Salmons, MD;  Location: [facility] ORS;  Service: ENT;  Laterality: Bilateral;   Family History:  Family History  Problem Relation Age of Onset  . Hepatitis C Mother   . Cancer Mother   . Cancer Maternal Aunt   . Hypertension Maternal Grandmother    Family Psychiatric  History: Denies Social History:  Social History   Substance and Sexual Activity  Alcohol Use Yes     Social History   Substance and Sexual Activity  Drug Use No   Comment: history of cocaine per Dr Mikey Bussing  H&P    Social History   Socioeconomic History  . Marital status: Divorced    Spouse name: Not on file  . Number  of children: Not on file  . Years of education: Not on file  . Highest education level: Not on file  Occupational History  . Not on file  Social Needs  . Financial resource strain: Not on file  . Food insecurity    Worry: Not on file    Inability: Not on file  . Transportation needs    Medical: Not on file    Non-medical: Not on file  Tobacco Use  . Smoking status: Current Every Day Smoker    Packs/day: 1.00    Years: 20.00    Pack years: 20.00    Types: Cigarettes  . Smokeless tobacco: Never Used  Substance and Sexual Activity  . Alcohol use: Yes  . Drug use: No    Comment: history of cocaine per Dr Mikey Bussing H&P  . Sexual activity: Yes    Birth control/protection: None  Lifestyle  . Physical activity    Days per week: Not on file    Minutes per session: Not on file  . Stress: Not on file  Relationships  . Social Musician on phone: Not on file    Gets together: Not on file    Attends religious service: Not on file    Active member of club or organization: Not on file    Attends meetings of clubs or organizations: Not on file    Relationship status: Not on file  Other Topics Concern  . Not on file  Social History Narrative  . Not on file    Hospital Course:  Cory Floyd was admitted for Bipolar disorder without psychotic features [facility]) and crisis management.  He was treated with the following medications Depakote 1500mg , Abilify 15mg , and Propanolol 40.  Cory Floyd was discharged with current medication and was instructed on how to take medications as prescribed; (details listed below under Medication List).  Medical problems were identified and treated as needed.  Home medications were restarted as appropriate. We resumed his Gabapentin and cyclobenzaprine po TID. He also had a low potassium on admission which was replaced.   Improvement was monitored by observation and daily report of symptom reduction.  Emotional and mental status  was monitored by daily self-inventory reports completed by Cory Floyd and clinical staff.         Cory Floyd was evaluated by the treatment team for stability and plans for continued recovery upon discharge.  KOBEN DAMAN motivation was an integral factor for scheduling further treatment.  Employment, transportation, bed availability, health status, family support, and any pending legal issues were also considered during his hospital stay.  He was offered further treatment options upon discharge including but not limited to Residential, Intensive Outpatient, and Outpatient treatment.  MIRAN KAUTZMAN will follow up with the services as listed below under Follow Up Information. He has wrap around services through [facility] and his pharmacy has been verified.      Upon completion of this admission the CASSON CATENA was both mentally and medically stable for discharge denying suicidal/homicidal ideation, auditory/visual/tactile hallucinations, delusional thoughts and paranoia.      Physical Findings: AIMS:  , ,  ,  ,    CIWA:    COWS:     Musculoskeletal: Strength & Muscle Tone: within normal limits Gait &  Station: normal Patient leans: N/A  Psychiatric Specialty Exam:See MD SRA Physical Exam  ROS  Blood pressure 95/75, pulse (!) 115, temperature 97.7 F (36.5 C), temperature source Oral, resp. rate 16, height 5\' 9"  (1.753 m), weight 104.3 kg, SpO2 100 %.Body mass index is 33.96 kg/m.  Sleep:  Number of Hours: 7     Have you used any form of tobacco in the last 30 days? (Cigarettes, Smokeless Tobacco, Cigars, and/or Pipes): Yes  Has this patient used any form of tobacco in the last 30 days? (Cigarettes, Smokeless Tobacco, Cigars, and/or Pipes)  No  Blood Alcohol level:  Lab Results  Component Value Date   ETH <10 09/16/2019   ETH <10 09/18/2018    Metabolic Disorder Labs:  No results found for: HGBA1C, MPG No results found for: PROLACTIN No results found for:  CHOL, TRIG, HDL, CHOLHDL, VLDL, LDLCALC  See Psychiatric Specialty Exam and Suicide Risk Assessment completed by Attending Physician prior to discharge.  Discharge destination:  Home  Is patient on multiple antipsychotic therapies at discharge:  No   Has Patient had three or more failed trials of antipsychotic monotherapy by history:  No  Recommended Plan for Multiple Antipsychotic Therapies: NA   Allergies as of 09/23/2019      Reactions   Carrot [daucus Carota] Anaphylaxis, Rash   Penicillins Anaphylaxis, Hives   Has patient had a PCN reaction causing immediate rash, facial/tongue/throat swelling, SOB or lightheadedness with hypotension: Yes Has patient had a PCN reaction causing severe rash involving mucus membranes or skin necrosis: No Has patient had a PCN reaction that required hospitalization Yes Has patient had a PCN reaction occurring within the last 10 years: No If all of the above answers are "NO", then may proceed with Cephalosporin use.      Medication List    STOP taking these medications   acetaminophen 500 MG tablet Commonly known as: TYLENOL   mirtazapine 15 MG tablet Commonly known as: REMERON     TAKE these medications     Indication  ARIPiprazole 15 MG tablet Commonly known as: ABILIFY Take 1 tablet (15 mg total) by mouth daily. Start taking on: September 24, 2019 What changed:   medication strength  how much to take  when to take this  Indication: Major Depressive Disorder, Manic Phase of Manic-Depression   divalproex 500 MG 24 hr tablet Commonly known as: DEPAKOTE ER Take 3 tablets (1,500 mg total) by mouth daily with supper. What changed:   how much to take  when to take this  Indication: Manic Phase of Manic-Depression   gabapentin 300 MG capsule Commonly known as: NEURONTIN Take 1 capsule (300 mg total) by mouth 3 (three) times daily.  Indication: Restless Leg Syndrome, Social Anxiety Disorder   meloxicam 15 MG tablet Commonly  known as: MOBIC Take 1 tablet (15 mg total) by mouth daily. Start taking on: September 24, 2019  Indication: Joint Damage causing Pain and Loss of Function   naltrexone 50 MG tablet Commonly known as: DEPADE Take 50 mg by mouth daily.  Indication: Opioid Dependence   propranolol 40 MG tablet Commonly known as: INDERAL Take 1 tablet (40 mg total) by mouth daily. Start taking on: September 24, 2019 What changed:   medication strength  how much to take  when to take this  additional instructions  Indication: Feeling Anxious   traZODone 100 MG tablet Commonly known as: DESYREL Take 2 tablets (200 mg total) by mouth at bedtime as needed for sleep.  Indication: Drain Follow up on 09/30/2019.   Why: You have a therapy appointment scheduled with Pam for 09/30/2019 at 11:00AM, it will be over the phone. You have a medication management appointment with Adele Barthel on 05/69/7948 at 10am in person. Thank You! Contact information: [place] [address] [phone]           Follow-up recommendations:  Activity:  Increase activity as tolerated Diet:  Routine diet as directed Tests:  Routine depakote testing will be need after discharge. Please recheck your level in 1 week. All other labs were normal on admission. Recommend every 3 months testing for a1c, lipid,, cbc, and tsh.  Other:  Even if you begin to feel better continue taking your medications.    Signed: Suella Broad, FNP 09/23/2019, 10:56 AM

## 2019-09-23 NOTE — BHH Group Notes (Signed)
Feelings Around Diagnosis 09/23/2019 1PM  Type of Therapy/Topic:  Group Therapy:  Feelings about Diagnosis  Participation Level:  Did Not Attend   Description of Group:   This group will allow patients to explore their thoughts and feelings about diagnoses they have received. Patients will be guided to explore their level of understanding and acceptance of these diagnoses. Facilitator will encourage patients to process their thoughts and feelings about the reactions of others to their diagnosis and will guide patients in identifying ways to discuss their diagnosis with significant others in their lives. This group will be process-oriented, with patients participating in exploration of their own experiences, giving and receiving support, and processing challenge from other group members.   Therapeutic Goals: 1. Patient will demonstrate understanding of diagnosis as evidenced by identifying two or more symptoms of the disorder 2. Patient will be able to express two feelings regarding the diagnosis 3. Patient will demonstrate their ability to communicate their needs through discussion and/or role play  Summary of Patient Progress:       Therapeutic Modalities:   Cognitive Behavioral Therapy Brief Therapy Feelings Identification    Yvette Rack, LCSW 09/23/2019 2:21 PM

## 2019-09-28 ENCOUNTER — Other Ambulatory Visit: Payer: Self-pay

## 2019-09-28 ENCOUNTER — Emergency Department: Payer: Medicare Other

## 2019-09-28 ENCOUNTER — Emergency Department
Admission: EM | Admit: 2019-09-28 | Discharge: 2019-09-29 | Disposition: A | Discharge status: Home / Self Care | Payer: Medicare Other | Attending: Emergency Medicine | Admitting: Emergency Medicine

## 2019-09-28 DIAGNOSIS — F1410 Cocaine abuse, uncomplicated: Secondary | ICD-10-CM | POA: Insufficient documentation

## 2019-09-28 DIAGNOSIS — E860 Dehydration: Secondary | ICD-10-CM

## 2019-09-28 DIAGNOSIS — Z79899 Other long term (current) drug therapy: Secondary | ICD-10-CM | POA: Insufficient documentation

## 2019-09-28 DIAGNOSIS — Z20828 Contact with and (suspected) exposure to other viral communicable diseases: Secondary | ICD-10-CM | POA: Insufficient documentation

## 2019-09-28 DIAGNOSIS — F17210 Nicotine dependence, cigarettes, uncomplicated: Secondary | ICD-10-CM | POA: Insufficient documentation

## 2019-09-28 DIAGNOSIS — R0602 Shortness of breath: Secondary | ICD-10-CM

## 2019-09-28 DIAGNOSIS — Z23 Encounter for immunization: Secondary | ICD-10-CM | POA: Insufficient documentation

## 2019-09-28 LAB — CBC
HCT: 47.7 % (ref 39.0–52.0)
Hemoglobin: 15.7 g/dL (ref 13.0–17.0)
MCH: 31.1 pg (ref 26.0–34.0)
MCHC: 32.9 g/dL (ref 30.0–36.0)
MCV: 94.5 fL (ref 80.0–100.0)
Platelets: 200 10*3/uL (ref 150–400)
RBC: 5.05 MIL/uL (ref 4.22–5.81)
RDW: 12.8 % (ref 11.5–15.5)
WBC: 10.5 10*3/uL (ref 4.0–10.5)
nRBC: 0 % (ref 0.0–0.2)

## 2019-09-28 MED ORDER — SODIUM CHLORIDE 0.9 % IV BOLUS
1000.0000 mL | Freq: Once | INTRAVENOUS | Status: AC
  Administered 2019-09-28: 1000 mL via INTRAVENOUS
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
## 2019-09-29 ENCOUNTER — Encounter: Payer: Self-pay | Admitting: Emergency Medicine

## 2019-09-29 ENCOUNTER — Emergency Department: Payer: Medicare Other

## 2019-09-29 ENCOUNTER — Emergency Department
Admission: EM | Admit: 2019-09-29 | Discharge: 2019-09-30 | Disposition: A | Discharge status: Other Acute Inpatient Hospital | Payer: Medicare Other | Source: Home / Self Care | Attending: Emergency Medicine | Admitting: Emergency Medicine

## 2019-09-29 DIAGNOSIS — Z79899 Other long term (current) drug therapy: Secondary | ICD-10-CM | POA: Insufficient documentation

## 2019-09-29 DIAGNOSIS — F22 Delusional disorders: Secondary | ICD-10-CM | POA: Insufficient documentation

## 2019-09-29 DIAGNOSIS — M545 Low back pain: Secondary | ICD-10-CM | POA: Insufficient documentation

## 2019-09-29 DIAGNOSIS — F319 Bipolar disorder, unspecified: Secondary | ICD-10-CM | POA: Insufficient documentation

## 2019-09-29 DIAGNOSIS — F17210 Nicotine dependence, cigarettes, uncomplicated: Secondary | ICD-10-CM | POA: Insufficient documentation

## 2019-09-29 DIAGNOSIS — Z20828 Contact with and (suspected) exposure to other viral communicable diseases: Secondary | ICD-10-CM | POA: Insufficient documentation

## 2019-09-29 DIAGNOSIS — M25561 Pain in right knee: Secondary | ICD-10-CM | POA: Insufficient documentation

## 2019-09-29 LAB — BASIC METABOLIC PANEL
Anion gap: 25 — ABNORMAL HIGH (ref 5–15)
BUN: 18 mg/dL (ref 6–20)
CO2: 12 mmol/L — ABNORMAL LOW (ref 22–32)
Calcium: 9.4 mg/dL (ref 8.9–10.3)
Chloride: 104 mmol/L (ref 98–111)
Creatinine, Ser: 1.57 mg/dL — ABNORMAL HIGH (ref 0.61–1.24)
GFR calc Af Amer: >60 mL/min (ref >60)
GFR calc non Af Amer: 52 mL/min — ABNORMAL LOW (ref >60)
Glucose, Bld: 192 mg/dL — ABNORMAL HIGH (ref 70–99)
Potassium: 4.3 mmol/L (ref 3.5–5.1)
Sodium: 141 mmol/L (ref 135–145)

## 2019-09-29 LAB — TROPONIN I (HIGH SENSITIVITY)
Troponin I (High Sensitivity): 11 ng/L (ref <18)
Troponin I (High Sensitivity): 15 ng/L (ref <18)

## 2019-09-29 MED ORDER — TETANUS-DIPHTH-ACELL PERTUSSIS 5-2.5-18.5 LF-MCG/0.5 IM SUSP
0.5000 mL | Freq: Once | INTRAMUSCULAR | Status: AC
  Administered 2019-09-29: 0.5 mL via INTRAMUSCULAR
  Filled 2019-09-29: qty 0.5

## 2019-09-29 NOTE — BH Assessment (Addendum)
Patient is to be admitted to [facility] by Dr. Claris Gower.  Attending Physician will be Dr. Weber Cooks.   Patient has been assigned to room 316, by [place].   Intake Paper Work has been signed and placed on patient chart.   ER staff is aware of the admission:  Anne Ng, ER Secretary    Dr. Charna Archer, ER MD   Amy B., Patient's Nurse   Fisher, Patient Access.

## 2019-09-29 NOTE — ED Notes (Addendum)
Pt's wet pant removed and placed with socks in belonging bag, pt placed on chux, and given warm blankets, ice water provided as requested  Urinal at bedside d/t request to urinate  C/o chronic right knee pain

## 2019-09-29 NOTE — ED Notes (Signed)
Report to include Situation, Background, Assessment, and Recommendations received from Amy B. RN. Patient alert and oriented, warm and dry, in no acute distress. Patient denies HI, VH and pain. Patient states he has SI without plan and hears voices just talking. Patient made aware of Q15 minute rounds and security cameras for their safety. Patient instructed to come to me with needs or concerns.

## 2019-09-29 NOTE — ED Notes (Signed)
Pt given ice water to drink at this time to assess pt's ability to tolerate PO intake (pt denies wanting anything to eat).

## 2019-09-29 NOTE — ED Notes (Signed)
Patient gone to xray 

## 2019-09-29 NOTE — ED Notes (Signed)
Hourly rounding reveals patient sleeping in room. No complaints, stable, in no acute distress. Q15 minute rounds and monitoring via Security Cameras to continue. 
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
## 2019-09-29 NOTE — ED Provider Notes (Signed)
[facility] Emergency Department Provider Note  ____________________________________________  Time seen: Approximately 2:10 AM  I have reviewed the triage vital signs and the nursing notes.   HISTORY  Chief Complaint Shortness of Breath    HPI Cory Floyd is a 43 y.o. male with a history of bipolar disorder, schizophrenia, alcohol abuse is brought to the ED complaining of shortness of breath.  This is in the setting of using cocaine tonight and running outdoors through a creek.  Patient also complains of right-sided chest wall pain.  Not pleuritic.  Denies fevers chills or body aches.  No other coingestions.  Denies hallucinations headache vision changes SI or HI.      Past Medical History:  Diagnosis Date  . 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 Anginal pain (HCC)   . Arthritis   . Bipolar 1 disorder (HCC)   . Depressed   . Dyspnea   . Fatty liver   . Schizophrenia [facility](HCC)      Patient Active Problem List   Diagnosis Date Noted  . Bipolar disorder without psychotic features (HCC) 09/18/2019  . Paranoia (HCC) 09/17/2019  . Psychosis (HCC) 09/17/2019  . Psychotic disorder (HCC) 09/17/2019  . Gastroesophageal reflux disease without esophagitis 09/21/2016  . Obstructive sleep apnea 09/21/2016  . Morbid obesity with BMI of 40.0-44.9, adult (HCC) 08/15/2016  . Stable angina pectoris (HCC) 08/15/2016     Past Surgical History:  Procedure Laterality Date  . COLONOSCOPY  02/05/2014   diverticulosis, hyperplastic polyp x 2  . fracture arm Right   . KNEE SURGERY Right 2013  . NASAL SEPTUM SURGERY    . NASAL TURBINATE REDUCTION Bilateral 12/12/2016   Procedure: TURBINATE REDUCTION/SUBMUCOSAL RESECTION;  Surgeon: Linus Salmonshapman [name], MD;  Location: [facility] ORS;  Service: ENT;  Laterality: Bilateral;     Prior to Admission medications   Medication Sig Start Date End Date Taking? Authorizing Provider  ARIPiprazole (ABILIFY) 15 MG tablet Take 1 tablet  (15 mg total) by mouth daily. 09/24/19   Starkes-Perry, Juel Burrowakia S, FNP  divalproex (DEPAKOTE ER) 500 MG 24 hr tablet Take 3 tablets (1,500 mg total) by mouth daily with supper. 09/23/19   Starkes-Perry, Juel Burrowakia S, FNP  gabapentin (NEURONTIN) 300 MG capsule Take 1 capsule (300 mg total) by mouth 3 (three) times daily. 09/23/19   Starkes-Perry, Juel Burrowakia S, FNP  meloxicam (MOBIC) 15 MG tablet Take 1 tablet (15 mg total) by mouth daily. 09/24/19   Maryagnes AmosStarkes-Perry, Takia S, FNP  naltrexone (DEPADE) 50 MG tablet Take 50 mg by mouth daily. 07/01/19   [provider]  propranolol (INDERAL) 40 MG tablet Take 1 tablet (40 mg total) by mouth daily. 09/24/19   Starkes-Perry, Juel Burrowakia S, FNP  traZODone (DESYREL) 100 MG tablet Take 2 tablets (200 mg total) by mouth at bedtime as needed for sleep. 09/23/19   Maryagnes AmosStarkes-Perry, Takia S, FNP     Allergies Carrot [daucus carota] and Penicillins   Family History  Problem Relation Age of Onset  . Hepatitis C Mother   . Cancer Mother   . Cancer Maternal Aunt   . Hypertension Maternal Grandmother     Social History Social History   Tobacco Use  . Smoking status: Current Every Day Smoker    Packs/day: 1.00    Years: 20.00    Pack years: 20.00    Types: Cigarettes  . Smokeless tobacco: Never Used  Substance Use Topics  . Alcohol use: Yes  . Drug use: No  Comment: history of cocaine per Dr Ileene Hutchinson H&P    Review of Systems  Constitutional:   No fever or chills.  ENT:   No sore throat. No rhinorrhea. Cardiovascular:   No chest pain or syncope. Respiratory: Positive shortness of breath and nonproductive cough. Gastrointestinal:   Negative for abdominal pain, vomiting and diarrhea.  Musculoskeletal:   Positive right chest wall pain All other systems reviewed and are negative except as documented above in ROS and HPI.  ____________________________________________   PHYSICAL EXAM:  VITAL SIGNS: ED Triage Vitals  Enc Vitals Group     BP 09/29/19 0017  122/76     Pulse Rate 09/29/19 0017 (!) 102     Resp 09/29/19 0017 16     Temp --      Temp src --      SpO2 09/29/19 0017 100 %     Weight 09/28/19 2323 229 lb 15 oz (104.3 kg)     Height 09/28/19 2323 5\' 10"  (1.778 m)     Head Circumference --      Peak Flow --      Pain Score 09/28/19 2323 5     Pain Loc --      Pain Edu? --      Excl. in [place]? --     Vital signs reviewed, nursing assessments reviewed.   Constitutional:   Alert and oriented. Non-toxic appearance. Eyes:   Conjunctivae are normal. EOMI. PERRL. ENT      Head:   Normocephalic and atraumatic.      Nose:   Wearing a mask.      Mouth/Throat:   Wearing a mask.      Neck:   No meningismus. Full ROM. Hematological/Lymphatic/Immunilogical:   No cervical lymphadenopathy. Cardiovascular:   RRR. Symmetric bilateral radial and DP pulses.  No murmurs. Cap refill less than 2 seconds. Respiratory:   Normal respiratory effort without tachypnea/retractions.  Diffuse expiratory wheezing.  Symmetric breath sounds, good air entry. Gastrointestinal:   Soft and nontender. Non distended. There is no CVA tenderness.  No rebound, rigidity, or guarding. Musculoskeletal:   Normal range of motion in all extremities. No joint effusions.  No lower extremity tenderness.  No edema. Neurologic:   Normal speech and language.  Motor grossly intact. No acute focal neurologic deficits are appreciated.  Skin:    Skin is warm, dry and intact.  Scattered abrasions but no lacerations.  No rash noted.  No petechiae, purpura, or bullae.  ____________________________________________    LABS (pertinent positives/negatives) (all labs ordered are listed, but only abnormal results are displayed) Labs Reviewed  BASIC METABOLIC PANEL - Abnormal; Notable for the following components:      Result Value   CO2 12 (*)    Glucose, Bld 192 (*)    Creatinine, Ser 1.57 (*)    GFR calc non Af Amer 52 (*)    Anion gap 25 (*)    All other components within normal  limits  CBC  TROPONIN I (HIGH SENSITIVITY)  TROPONIN I (HIGH SENSITIVITY)   ____________________________________________   EKG  Interpreted by me Sinus tachycardia rate 114.  Normal axis intervals QRS ST segments and T waves.  ____________________________________________    ZOXWRUEAV  Dg Chest Port 1 View  Result Date: 09/29/2019 CLINICAL DATA:  Shortness of breath EXAM: PORTABLE CHEST 1 VIEW COMPARISON:  September 29, 2017 FINDINGS: The heart size and mediastinal contours are within normal limits. Both lungs are clear. The visualized skeletal structures are unremarkable. IMPRESSION: No  active disease. Electronically Signed   By: Katherine Mantle M.D.   On: 09/29/2019 00:08    ____________________________________________   PROCEDURES Procedures  ____________________________________________    CLINICAL IMPRESSION / ASSESSMENT AND PLAN / ED COURSE  Medications ordered in the ED: Medications  sodium chloride 0.9 % bolus 1,000 mL (0 mLs Intravenous Stopped 09/29/19 0033)    Pertinent labs & imaging results that were available during my care of the patient were reviewed by me and considered in my medical decision making (see chart for details).  Cory Floyd was evaluated in Emergency Department on 09/29/2019 for the symptoms described in the history of present illness. He was evaluated in the context of the global COVID-19 pandemic, which necessitated consideration that the patient might be at risk for infection with the SARS-CoV-2 virus that causes COVID-19. Institutional protocols and algorithms that pertain to the evaluation of patients at risk for COVID-19 are in a state of rapid change based on information released by regulatory bodies including the CDC and federal and state organizations. These policies and algorithms were followed during the patient's care in the ED.   Patient presents with shortness of breath after cocaine abuse and running outside.  Exam is  reassuring.  He appears to be mildly dehydrated.  Will check labs, serial troponins, chest x-ray, give a tetanus shot.  IV fluids for hydration.  He has some wheezing but no hypoxia or respiratory distress, and with his recent cocaine use, will need to avoid nebs for now.  Clinical Course as of Sep 28 248  Mon Sep 29, 2019  5188 Serial troponins unremarkable.  Vital signs normal.  Stable for discharge home.   [PS]    Clinical Course User Index [PS] Sharman Cheek, MD     ____________________________________________   FINAL CLINICAL IMPRESSION(S) / ED DIAGNOSES    Final diagnoses:  Cocaine abuse (HCC)  Dehydration     ED Discharge Orders    None      Portions of this note were generated with dragon dictation software. Dictation errors may occur despite best attempts at proofreading.   Sharman Cheek, MD 09/29/19 [phone]
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
## 2019-09-29 NOTE — ED Notes (Signed)
Patient lying in bed and appears to be asleep, NAD observed and will continue to monitor. Patient is asleep and snoring in the hallway

## 2019-09-29 NOTE — ED Notes (Signed)
Patient returned from xray.

## 2019-09-29 NOTE — ED Triage Notes (Signed)
Pt discharged in blue scrubs. Pt states that he has nowhere to go and wants to check back in. Pt yelling at staff.
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
## 2019-09-30 ENCOUNTER — Other Ambulatory Visit: Payer: Self-pay

## 2019-09-30 ENCOUNTER — Inpatient Hospital Stay
Admission: RE | Admit: 2019-09-30 | Discharge: 2019-10-07 | DRG: 885 | Disposition: A | Discharge status: Home / Self Care | Payer: Medicare Other | Source: Intra-hospital | Attending: Family Medicine | Admitting: Family Medicine

## 2019-09-30 DIAGNOSIS — M549 Dorsalgia, unspecified: Secondary | ICD-10-CM

## 2019-09-30 DIAGNOSIS — M1990 Unspecified osteoarthritis, unspecified site: Secondary | ICD-10-CM

## 2019-09-30 DIAGNOSIS — U071 COVID-19: Secondary | ICD-10-CM | POA: Diagnosis present

## 2019-09-30 DIAGNOSIS — F4010 Social phobia, unspecified: Secondary | ICD-10-CM | POA: Diagnosis present

## 2019-09-30 DIAGNOSIS — N179 Acute kidney failure, unspecified: Secondary | ICD-10-CM | POA: Diagnosis present

## 2019-09-30 DIAGNOSIS — G4700 Insomnia, unspecified: Secondary | ICD-10-CM | POA: Diagnosis present

## 2019-09-30 DIAGNOSIS — Z6841 Body Mass Index (BMI) 40.0 and over, adult: Secondary | ICD-10-CM

## 2019-09-30 DIAGNOSIS — Z590 Homelessness: Secondary | ICD-10-CM | POA: Diagnosis not present

## 2019-09-30 DIAGNOSIS — F29 Unspecified psychosis not due to a substance or known physiological condition: Secondary | ICD-10-CM | POA: Diagnosis present

## 2019-09-30 DIAGNOSIS — F17210 Nicotine dependence, cigarettes, uncomplicated: Secondary | ICD-10-CM | POA: Diagnosis present

## 2019-09-30 DIAGNOSIS — G2581 Restless legs syndrome: Secondary | ICD-10-CM | POA: Diagnosis present

## 2019-09-30 DIAGNOSIS — K760 Fatty (change of) liver, not elsewhere classified: Secondary | ICD-10-CM | POA: Diagnosis present

## 2019-09-30 DIAGNOSIS — F3164 Bipolar disorder, current episode mixed, severe, with psychotic features: Secondary | ICD-10-CM | POA: Diagnosis present

## 2019-09-30 DIAGNOSIS — G4733 Obstructive sleep apnea (adult) (pediatric): Secondary | ICD-10-CM | POA: Diagnosis present

## 2019-09-30 DIAGNOSIS — F319 Bipolar disorder, unspecified: Secondary | ICD-10-CM | POA: Diagnosis present

## 2019-09-30 DIAGNOSIS — F250 Schizoaffective disorder, bipolar type: Secondary | ICD-10-CM | POA: Diagnosis not present

## 2019-09-30 LAB — SARS CORONAVIRUS 2 (TAT 6-24 HRS): SARS Coronavirus 2: NEGATIVE

## 2019-09-30 MED ORDER — ALUM & MAG HYDROXIDE-SIMETH 200-200-20 MG/5ML PO SUSP: 30.0000 mL | ORAL | Status: DC | PRN

## 2019-09-30 MED ORDER — DIVALPROEX SODIUM ER 500 MG PO TB24
1500.0000 mg | ORAL_TABLET | Freq: Every day | ORAL | Status: DC
  Administered 2019-09-30 – 2019-10-06 (×7): 1500 mg via ORAL
  Filled 2019-09-30 (×8): qty 3

## 2019-09-30 MED ORDER — ARIPIPRAZOLE 5 MG PO TABS
15.0000 mg | ORAL_TABLET | Freq: Every day | ORAL | Status: DC
  Administered 2019-09-30 – 2019-10-01 (×2): 15 mg via ORAL
  Filled 2019-09-30 (×2): qty 1

## 2019-09-30 MED ORDER — TRAZODONE HCL 100 MG PO TABS
200.0000 mg | ORAL_TABLET | Freq: Every evening | ORAL | Status: DC | PRN
  Administered 2019-10-01 – 2019-10-02 (×3): 200 mg via ORAL
  Filled 2019-09-30 (×3): qty 2

## 2019-09-30 MED ORDER — MAGNESIUM HYDROXIDE 400 MG/5ML PO SUSP: 30.0000 mL | Freq: Every day | ORAL | Status: DC | PRN

## 2019-09-30 MED ORDER — ACETAMINOPHEN 325 MG PO TABS
650.0000 mg | ORAL_TABLET | Freq: Four times a day (QID) | ORAL | Status: DC | PRN
  Administered 2019-09-30 – 2019-10-04 (×2): 650 mg via ORAL
  Filled 2019-09-30: qty 2

## 2019-09-30 MED ORDER — GABAPENTIN 300 MG PO CAPS
300.0000 mg | ORAL_CAPSULE | Freq: Three times a day (TID) | ORAL | Status: DC
  Administered 2019-09-30 – 2019-10-07 (×20): 300 mg via ORAL
  Filled 2019-09-30 (×21): qty 1

## 2019-09-30 NOTE — Tx Team (Signed)
Interdisciplinary Treatment and Diagnostic Plan Update  09/30/2019 Time of Session: 200pm Cory Floyd MRN: [id number]  Principal Diagnosis: <principal problem not specified>  Secondary Diagnoses: Active Problems:   Bipolar 1 disorder (HCC)   Current Medications:  Current Facility-Administered Medications  Medication Dose Route Frequency Provider Last Rate Last Dose  . acetaminophen (TYLENOL) tablet 650 mg  650 mg Oral Q6H PRN Dixie Dials, MD   650 mg at 09/30/19 6283  . alum & mag hydroxide-simeth (MAALOX/MYLANTA) 200-200-20 MG/5ML suspension 30 mL  30 mL Oral Q4H PRN Cristofano, Paul A, MD      . ARIPiprazole (ABILIFY) tablet 15 mg  15 mg Oral Daily Cristofano, Dorene Ar, MD   15 mg at 09/30/19 6629  . divalproex (DEPAKOTE ER) 24 hr tablet 1,500 mg  1,500 mg Oral Q supper Cristofano, Paul A, MD      . gabapentin (NEURONTIN) capsule 300 mg  300 mg Oral TID Cristofano, Dorene Ar, MD   300 mg at 09/30/19 1130  . magnesium hydroxide (MILK OF MAGNESIA) suspension 30 mL  30 mL Oral Daily PRN Cristofano, Dorene Ar, MD      . traZODone (DESYREL) tablet 200 mg  200 mg Oral QHS PRN Cristofano, Dorene Ar, MD       PTA Medications: Medications Prior to Admission  Medication Sig Dispense Refill Last Dose  . ARIPiprazole (ABILIFY) 15 MG tablet Take 1 tablet (15 mg total) by mouth daily. 15 tablet 0   . divalproex (DEPAKOTE ER) 500 MG 24 hr tablet Take 3 tablets (1,500 mg total) by mouth daily with supper. 45 tablet 0   . gabapentin (NEURONTIN) 300 MG capsule Take 1 capsule (300 mg total) by mouth 3 (three) times daily. 45 capsule 0   . meloxicam (MOBIC) 15 MG tablet Take 1 tablet (15 mg total) by mouth daily. 15 tablet 0   . propranolol (INDERAL) 40 MG tablet Take 1 tablet (40 mg total) by mouth daily. 15 tablet 0   . traZODone (DESYREL) 100 MG tablet Take 2 tablets (200 mg total) by mouth at bedtime as needed for sleep. 15 tablet 0     Patient Stressors: Health problems Medication change or  noncompliance Substance abuse  Patient Strengths: Motivation for treatment/growth Supportive family/friends  Treatment Modalities: Medication Management, Group therapy, Case management,  1 to 1 session with clinician, Psychoeducation, Recreational therapy.   Physician Treatment Plan for Primary Diagnosis: <principal problem not specified> Long Term Goal(s):     Short Term Goals:    Medication Management: Evaluate patient's response, side effects, and tolerance of medication regimen.  Therapeutic Interventions: 1 to 1 sessions, Unit Group sessions and Medication administration.  Evaluation of Outcomes: Not Met  Physician Treatment Plan for Secondary Diagnosis: Active Problems:   Bipolar 1 disorder ([place])  Long Term Goal(s):     Short Term Goals:       Medication Management: Evaluate patient's response, side effects, and tolerance of medication regimen.  Therapeutic Interventions: 1 to 1 sessions, Unit Group sessions and Medication administration.  Evaluation of Outcomes: Not Met   RN Treatment Plan for Primary Diagnosis: <principal problem not specified> Long Term Goal(s): Knowledge of disease and therapeutic regimen to maintain health will improve  Short Term Goals: Ability to demonstrate self-control, Ability to participate in decision making will improve, Ability to identify and develop effective coping behaviors will improve and Compliance with prescribed medications will improve  Medication Management: RN will administer medications as ordered by provider, will assess and evaluate  patient's response and provide education to patient for prescribed medication. RN will report any adverse and/or side effects to prescribing provider.  Therapeutic Interventions: 1 on 1 counseling sessions, Psychoeducation, Medication administration, Evaluate responses to treatment, Monitor vital signs and CBGs as ordered, Perform/monitor CIWA, COWS, AIMS and Fall Risk screenings as ordered,  Perform wound care treatments as ordered.  Evaluation of Outcomes: Not Met   LCSW Treatment Plan for Primary Diagnosis: <principal problem not specified> Long Term Goal(s): Safe transition to appropriate next level of care at discharge, Engage patient in therapeutic group addressing interpersonal concerns.  Short Term Goals: Engage patient in aftercare planning with referrals and resources and Increase skills for wellness and recovery  Therapeutic Interventions: Assess for all discharge needs, 1 to 1 time with Social worker, Explore available resources and support systems, Assess for adequacy in community support network, Educate family and significant other(s) on suicide prevention, Complete Psychosocial Assessment, Interpersonal group therapy.  Evaluation of Outcomes: Not Met   Progress in Treatment: Attending groups: No. Participating in groups: No. Taking medication as prescribed: Yes. Toleration medication: Yes. Family/Significant other contact made: No, will contact:  pt declined collateral contact Patient understands diagnosis: Yes. Discussing patient identified problems/goals with staff: No. Medical problems stabilized or resolved: Yes. Denies suicidal/homicidal ideation: No. Issues/concerns per patient self-inventory: No. Other: N/A  New problem(s) identified: No, Describe:  none  New Short Term/Long Term Goal(s): Detox, elimination of AVH/symptoms of psychosis, medication management for mood stabilization; elimination of SI thoughts; development of comprehensive mental wellness/sobriety plan.   Patient Goals:  "get my shit together, get out of here, and move on with my life"  Discharge Plan or Barriers: SPE pamphlet, Mobile Crisis information, and AA/NA information provided to patient for additional community support and resources. Pt agreeable to follow up with [facility].  Reason for Continuation of Hospitalization: Medication stabilization  Estimated Length  of Stay: 3-5 days  Attendees: Patient: Cory Floyd 09/30/2019 2:33 PM  Physician:  09/30/2019 2:33 PM  Nursing:  09/30/2019 2:33 PM  RN Care Manager: 09/30/2019 2:33 PM  Social Worker: Minette Brine Elzy Tomasello LCSW 09/30/2019 2:33 PM  Recreational Therapist:  09/30/2019 2:33 PM  Other:  09/30/2019 2:33 PM  Other:  09/30/2019 2:33 PM  Other: 09/30/2019 2:33 PM    Scribe for Treatment Team: Mariann Laster Cairo Lingenfelter, LCSW 09/30/2019 2:33 PM

## 2019-09-30 NOTE — BHH Suicide Risk Assessment (Signed)
[place] INPATIENT:  Family/Significant Other Suicide Prevention Education  Suicide Prevention Education:  Patient Refusal for Family/Significant Other Suicide Prevention Education: The patient Cory Floyd has refused to provide written consent for family/significant other to be provided Family/Significant Other Suicide Prevention Education during admission and/or prior to discharge.  Physician notified.   SPE completed with pt, as pt refused to consent to family contact. SPI pamphlet provided to pt and pt was encouraged to share information with support network, ask questions, and talk about any concerns relating to SPE. Pt denies access to guns/firearms and verbalized understanding of information provided. Mobile Crisis information also provided to pt.   [place] MSW LCSW 09/30/2019, 2:29 PM

## 2019-09-30 NOTE — Progress Notes (Signed)
Admission Note:  43 yr male who presents IVC in no acute distress for the treatment of SI and Psychosis. Patient upon arrival is alert and oriented x 4, affect is blunted, he appears irritable and restless,  He denies SI/HI/AVH, no paranoia noted contracts for safety upon admission. Per report patient acknowledged using drugs   (cocaine) running for hours and swimming through a creek. Patient appears disheveled upon arrival, poor hygiene with body odor noted. Patient has Past medical Hx of  Schizophrenia, Alcoholism, Arthritis, Bipolar disease, Fatty liver, Depression and dyspnea. Patient skin was assessed in presence of Meriel Pica RN  and found to be warm, dry and intact, he was also searched and no contraband found, plan of care, unit policies was explained and understanding verbalized.   Consents was also obtained. Patient was oriented to the unit and shown his room, 15 minutes safety checks maintained will continue to monitor.
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
## 2019-09-30 NOTE — BHH Counselor (Signed)
CSW attempted to completed pt assessment, pt reported he was tired he would do it later. CSW will try this afternoon to complete PSA.    Evalina Field, MSW, LCSW Clinical Social Work 09/30/2019 10:37 AM

## 2019-09-30 NOTE — Progress Notes (Signed)
Recreation Therapy Notes   Date: 09/30/2019  Time: 9:30 am   Location: Craft room   Behavioral response: N/A   Intervention Topic: Problem Solving  Discussion/Intervention: Patient did not attend group.   Clinical Observations/Feedback:  Patient did not attend group.   Sally Menard LRT/CTRS        Edin Kon 09/30/2019 11:07 AM

## 2019-09-30 NOTE — Plan of Care (Signed)
  Problem: Education: Goal: Knowledge of Protection General Education information/materials will improve Outcome: Progressing Goal: Mental status will improve Outcome: Progressing Goal: Verbalization of understanding the information provided will improve Outcome: Progressing   Problem: Safety: Goal: Periods of time without injury will increase Outcome: Progressing   Problem: Education: Goal: Will be free of psychotic symptoms Outcome: Progressing   Problem: Education: Goal: Emotional status will improve Outcome: Not Progressing

## 2019-09-30 NOTE — Tx Team (Signed)
Initial Treatment Plan 09/30/2019 3:55 AM Lendon Collar TFT:[id number]    PATIENT STRESSORS: Health problems Medication change or noncompliance Substance abuse   PATIENT STRENGTHS: Motivation for treatment/growth Supportive family/friends   PATIENT IDENTIFIED PROBLEMS: Psychosis     Suicidal ideation                  DISCHARGE CRITERIA:  Improved stabilization in mood, thinking, and/or behavior Motivation to continue treatment in a less acute level of care  PRELIMINARY DISCHARGE PLAN: Outpatient therapy  PATIENT/FAMILY INVOLVEMENT: This treatment plan has been presented to and reviewed with the patient, Cory Floyd, Cory Floyd patient and family have been given the opportunity to ask questions and make suggestions.  Harl Bowie, RN 09/30/2019, 3:55 AM

## 2019-09-30 NOTE — ED Notes (Signed)
Hourly rounding reveals patient sleeping in room. No complaints, stable, in no acute distress. Q15 minute rounds and monitoring via Security Cameras to continue. 

## 2019-09-30 NOTE — Progress Notes (Signed)
Patient irritable, complains of back pain. Tylenol given this am with little relief. Denies SI, HI, AVH. Out of room for meals but sleep most of day. Encouragement and support offered. Safety checks maintained. Medications given as prescribed. Pt receptive and remains safe on unit with q 15 min checks.
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
## 2019-09-30 NOTE — BHH Group Notes (Signed)
  LCSW Group Therapy Note  09/30/2019 11:41 AM   Type of Therapy/Topic:  Group Therapy:  Feelings about Diagnosis  Participation Level:  Did Not Attend   Description of Group:   This group will allow patients to explore their thoughts and feelings about diagnoses they have received. Patients will be guided to explore their level of understanding and acceptance of these diagnoses. Facilitator will encourage patients to process their thoughts and feelings about the reactions of others to their diagnosis and will guide patients in identifying ways to discuss their diagnosis with significant others in their lives. This group will be process-oriented, with patients participating in exploration of their own experiences, giving and receiving support, and processing challenge from other group members.   Therapeutic Goals: 1. Patient will demonstrate understanding of diagnosis as evidenced by identifying two or more symptoms of the disorder 2. Patient will be able to express two feelings regarding the diagnosis 3. Patient will demonstrate their ability to communicate their needs through discussion and/or role play  Summary of Patient Progress: x   Therapeutic Modalities:   Cognitive Behavioral Therapy Brief Therapy Feelings Identification    Evalina Field, MSW, LCSW Clinical Social Work 09/30/2019 11:41 AM

## 2019-09-30 NOTE — BHH Suicide Risk Assessment (Signed)
[facility] Admission Suicide Risk Assessment   Nursing information obtained from:  Patient Demographic factors:  NA Current Mental Status:  NA Loss Factors:  NA Historical Factors:  NA Risk Reduction Factors:  NA  Total Time spent with patient: 1 hour Principal Problem: Bipolar affective disorder, mixed, severe, with psychotic behavior (HCC) Diagnosis:  Principal Problem:   Bipolar affective disorder, mixed, severe, with psychotic behavior (HCC) Active Problems:   Back pain   Arthritis  Subjective Data: Patient seen chart reviewed.  Patient brought into the hospital after being found agitated psychotic with poor self-care.  No evidence of intentional suicidal behavior.  On interview today the patient remains paranoid and psychotic but denies suicidal ideation.  Continued Clinical Symptoms:  Alcohol Use Disorder Identification Test Final Score (AUDIT): 0 The "Alcohol Use Disorders Identification Test", Guidelines for Use in Primary Care, Second Edition.  World Science writer [facility]). Score between 0-7:  no or low risk or alcohol related problems. Score between 8-15:  moderate risk of alcohol related problems. Score between 16-19:  high risk of alcohol related problems. Score 20 or above:  warrants further diagnostic evaluation for alcohol dependence and treatment.   CLINICAL FACTORS:   Bipolar Disorder:   Mixed State Schizophrenia:   Paranoid or undifferentiated type   Musculoskeletal: Strength & Muscle Tone: within normal limits Gait & Station: normal Patient leans: N/A  Psychiatric Specialty Exam: Physical Exam  Nursing note and vitals reviewed. Constitutional: He appears well-developed and well-nourished.  HENT:  Head: Normocephalic and atraumatic.  Eyes: Pupils are equal, round, and reactive to light. Conjunctivae are normal.  Neck: Normal range of motion.  Cardiovascular: Regular rhythm and normal heart sounds.  Respiratory: Effort normal.  GI: Soft.   Musculoskeletal: Normal range of motion.  Neurological: He is alert.  Skin: Skin is warm and dry.  Psychiatric: His mood appears anxious. His affect is blunt. His speech is tangential. He is withdrawn. He is not agitated and not aggressive. Thought content is paranoid and delusional. Cognition and memory are impaired. He expresses impulsivity and inappropriate judgment. He exhibits abnormal recent memory.    Review of Systems  Constitutional: Negative.   HENT: Negative.   Eyes: Negative.   Respiratory: Negative.   Cardiovascular: Negative.   Gastrointestinal: Negative.   Musculoskeletal: Positive for back pain and joint pain.  Skin: Negative.   Neurological: Negative.   Psychiatric/Behavioral: Positive for depression and hallucinations. Negative for substance abuse and suicidal ideas. The patient is nervous/anxious and has insomnia.     Blood pressure 132/80, pulse 92, temperature 98.6 F (37 C), temperature source Oral, resp. rate 15, height 5\' 9"  (1.753 m), weight 103.9 kg, SpO2 99 %.Body mass index is 33.83 kg/m.  General Appearance: Disheveled  Eye Contact:  Minimal  Speech:  Slow  Volume:  Decreased  Mood:  Anxious and Irritable  Affect:  Congruent  Thought Process:  Disorganized  Orientation:  Full (Time, Place, and Person)  Thought Content:  Illogical, Delusions and Paranoid Ideation  Suicidal Thoughts:  No  Homicidal Thoughts:  No  Memory:  Immediate;   Fair Recent;   Poor Remote;   Poor  Judgement:  Poor  Insight:  Lacking  Psychomotor Activity:  Decreased  Concentration:  Concentration: Poor  Recall:  Poor  Fund of Knowledge:  Poor  Language:  Poor  Akathisia:  No  Handed:  Right  AIMS (if indicated):     Assets:  Desire for Improvement Resilience  ADL's:  Impaired  Cognition:  Impaired,  Mild  Sleep:  Number of Hours: 3      COGNITIVE FEATURES THAT CONTRIBUTE TO RISK:  Loss of executive function    SUICIDE RISK:   Minimal: No identifiable suicidal  ideation.  Patients presenting with no risk factors but with morbid ruminations; may be classified as minimal risk based on the severity of the depressive symptoms  PLAN OF CARE: Patient brought into the hospital in the midst of a psychotic episode.  He was certainly not taking care of himself well but there is no indication that he was having suicidal thoughts.  Denies suicidal ideation now.  Patient will be put back on his usual medicine and receive a full evaluation by treatment team and be engaged in appropriate outpatient treatment.  I certify that inpatient services furnished can reasonably be expected to improve the patient's condition.   Alethia Berthold, MD 09/30/2019, 4:30 PM

## 2019-09-30 NOTE — Progress Notes (Signed)
CSW received a phone call from pts peer support, Anderson Malta who inquired about how pt is doing and if pt is taking his medications. Anderson Malta reported she was going to fax over a list of medications that pt is supposed to be taking. When the list is received, CSW will provide it to attending physician.   Evalina Field, MSW, LCSW Clinical Social Work 09/30/2019 3:12 PM

## 2019-09-30 NOTE — Plan of Care (Signed)
Patient newly admitted, hasn't had time to progress  Problem: Education: Goal: Knowledge of [facility] Education information/materials will improve Outcome: Not Progressing Goal: Emotional status will improve Outcome: Not Progressing Goal: Mental status will improve Outcome: Not Progressing Goal: Verbalization of understanding the information provided will improve Outcome: Not Progressing   Problem: Safety: Goal: Periods of time without injury will increase Outcome: Not Progressing   Problem: Activity: Goal: Will verbalize the importance of balancing activity with adequate rest periods Outcome: Not Progressing   Problem: Education: Goal: Will be free of psychotic symptoms Outcome: Not Progressing
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
## 2019-10-01 DIAGNOSIS — F3164 Bipolar disorder, current episode mixed, severe, with psychotic features: Secondary | ICD-10-CM | POA: Diagnosis not present

## 2019-10-01 MED ORDER — ARIPIPRAZOLE 10 MG PO TABS
20.0000 mg | ORAL_TABLET | Freq: Every day | ORAL | Status: DC
  Administered 2019-10-02: 08:00:00 20 mg via ORAL
  Filled 2019-10-01: qty 2

## 2019-10-01 MED ORDER — IBUPROFEN 600 MG PO TABS
800.0000 mg | ORAL_TABLET | Freq: Three times a day (TID) | ORAL | Status: DC | PRN
  Administered 2019-10-03: 800 mg via ORAL
  Filled 2019-10-01: qty 1

## 2019-10-01 NOTE — BHH Group Notes (Signed)
LCSW Group Therapy Note  10/01/2019 1:00 PM  Type of Therapy/Topic:  Group Therapy:  Emotion Regulation  Participation Level:  Did Not Attend   Description of Group:   The purpose of this group is to assist patients in learning to regulate negative emotions and experience positive emotions. Patients will be guided to discuss ways in which they have been vulnerable to their negative emotions. These vulnerabilities will be juxtaposed with experiences of positive emotions or situations, and patients will be challenged to use positive emotions to combat negative ones. Special emphasis will be placed on coping with negative emotions in conflict situations, and patients will process healthy conflict resolution skills.  Therapeutic Goals: 1. Patient will identify two positive emotions or experiences to reflect on in order to balance out negative emotions 2. Patient will label two or more emotions that they find the most difficult to experience 3. Patient will demonstrate positive conflict resolution skills through discussion and/or role plays  Summary of Patient Progress: X  Therapeutic Modalities:   Cognitive Behavioral Therapy Feelings Identification Dialectical Behavioral Therapy  Danni Leabo, MSW, LCSW 10/01/2019 11:42 AM 

## 2019-10-01 NOTE — Progress Notes (Signed)
Recreation Therapy Notes  INPATIENT RECREATION THERAPY ASSESSMENT  Patient Details Name: Cory Floyd MRN: [id number] DOB: Feb 15, 1976 Today's Date: 10/01/2019       Information Obtained From: Patient  Able to Participate in Assessment/Interview: Yes  Patient Presentation: Responsive  Reason for Admission (Per Patient): Active Symptoms  Patient Stressors:    Coping Skills:   TV  Leisure Interests (2+):  Individual - TV  Frequency of Recreation/Participation: Monthly  Awareness of Community Resources:     [employer]:     Current Use:    If no, Barriers?:    Expressed Interest in [name] Information:    [employer] of Residence:  Insurance underwriter  Patient Main Form of Transportation: Diplomatic Services operational officer  Patient Strengths:  N/A  Patient Identified Areas of Improvement:  Getting my life together  Patient Goal for Hospitalization:  Getting discharged  Current SI (including self-harm):  No  Current HI:  No  Current AVH: No  Staff Intervention Plan: Group Attendance, Collaborate with Interdisciplinary Treatment Team  Consent to Intern Participation: N/A  [name] 10/01/2019, 1:34 PM

## 2019-10-01 NOTE — Progress Notes (Signed)
  D: Bipolar  Affective  A: Patient remained in bed this am shift . Patient out of bed for  Meal time only . Medication compliant.  Affect pleasant on approach .  No unit programing   Patient stated slept good last night .Stated appetite is good and energy level  Is normal. Stated concentration is good . Stated  Stated he doesn't want to live .  Denies auditory hallucinations  No pain concerns . Appropriate ADL'S. Interacting with peers and staff.   Encourage patient participation with unit programming . Instruction  Given on  Medication , verbalize understanding.  R: Voice no other concerns. Staff continue to monitor

## 2019-10-01 NOTE — Plan of Care (Signed)
Pt will remain safe on the unit.  Problem: Education: Goal: Emotional status will improve Outcome: Progressing Goal: Mental status will improve Outcome: Progressing Goal: Verbalization of understanding the information provided will improve Outcome: Progressing   Problem: Safety: Goal: Periods of time without injury will increase Outcome: Progressing   Problem: Activity: Goal: Will verbalize the importance of balancing activity with adequate rest periods Outcome: Progressing   Problem: Education: Goal: Will be free of psychotic symptoms Outcome: Progressing

## 2019-10-01 NOTE — Progress Notes (Signed)
Patient alert and oriented x 4, appears less anxious not irritable, receptive to staff and receptive, his thoughts are organized and coherent, interacting appropriately with peers and staff, complaint with medication and attended evening wrap up group. 15 minutes safety checks maintained will continue to monitor.

## 2019-10-01 NOTE — Plan of Care (Signed)
Patient remained  in bed  no unit programing . Emotional and mental status  improving, No safety concerns . Encourage  patient to get adequate amount of sleep and rest. Thought process  remains  altered . Problem: Education: Goal: Knowledge of [facility] Education information/materials will improve Outcome: Not Progressing Goal: Emotional status will improve Outcome: Not Progressing Goal: Mental status will improve Outcome: Not Progressing Goal: Verbalization of understanding the information provided will improve Outcome: Not Progressing   Problem: Safety: Goal: Periods of time without injury will increase Outcome: Not Progressing   Problem: Activity: Goal: Will verbalize the importance of balancing activity with adequate rest periods Outcome: Not Progressing   Problem: Education: Goal: Will be free of psychotic symptoms Outcome: Not Progressing

## 2019-10-01 NOTE — Progress Notes (Signed)
Recreation Therapy Notes  Date: 10/01/2019  Time: 9:30 am   Location: Craft room   Behavioral response: N/A   Intervention Topic: Stress  Discussion/Intervention: Patient did not attend group.   Clinical Observations/Feedback:  Patient did not attend group.   Abass Misener LRT/CTRS        Nickalas Mccarrick 10/01/2019 11:26 AM

## 2019-10-01 NOTE — Progress Notes (Signed)
Pt seen going back and forth a few times to the break room from his room. Pt also seen engaging with other peers. Pt appears relaxed and friendly. Pt endorses auditory hallucinations but stated that it was voices in his head and they were good. When probed further, Pt states that the voices have not told him to do anything bad today but sometimes they do. Pt stated that he wants to take his med and go to bed. PT denies SI/HI/VH. Q 15 minute safety checks maintained.

## 2019-10-01 NOTE — Progress Notes (Signed)
[facility] MD Progress Note  10/01/2019 2:23 PM Cory Floyd  MRN:  [id number]   Subjective: There is a follow-up for patient diagnosed with bipolar affective disorder with psychotic behavior.  Patient reports today that he has been sleeping most of the day but he does feel somewhat better.  He states that he is continuing to have some hallucinations when he is awake.  He denies having any suicidal or homicidal ideations and denies any hallucinations.  He states that he has been on the Abilify 15 mg for approximately 1 year now and is considering if he needs to go up on his dose to try to get rid of the hallucinations.  Principal Problem: Bipolar affective disorder, mixed, severe, with psychotic behavior ([place]) Diagnosis: Principal Problem:   Bipolar affective disorder, mixed, severe, with psychotic behavior ([place]) Active Problems:   Back pain   Arthritis  Total Time spent with patient: 30 minutes  Past Psychiatric History: Patient has a past history of psychiatric disorder with a diagnosis of bipolar or schizoaffective disorder.  He was just recently in the hospital up till about a week ago and was discharged with the plan that he would be following up with [place] and relying on his peers support.  He had been vague at that time about where he was staying but had felt that he would be able to find someone to stay with.  Has a history of noncompliance and of falling into the sort of psychotic states when off of medicine.  Not typically abusing substances  Past Medical History:  Past Medical History:  Diagnosis Date  . 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 Anginal pain ([place])   . Arthritis   . Bipolar 1 disorder ([place])   . Depressed   . Dyspnea   . Fatty liver   . Schizophrenia [facility])     Past Surgical History:  Procedure Laterality Date  . COLONOSCOPY  02/05/2014   diverticulosis, hyperplastic polyp x 2  . fracture arm Right   . KNEE SURGERY Right 2013  . NASAL SEPTUM SURGERY     . NASAL TURBINATE REDUCTION Bilateral 12/12/2016   Procedure: TURBINATE REDUCTION/SUBMUCOSAL RESECTION;  Surgeon: Beverly Gust, MD;  Location: [facility] ORS;  Service: ENT;  Laterality: Bilateral;   Family History:  Family History  Problem Relation Age of Onset  . Hepatitis C Mother   . Cancer Mother   . Cancer Maternal Aunt   . Hypertension Maternal Grandmother    Family Psychiatric  History: Family history of alcohol abuse Social History:  Social History   Substance and Sexual Activity  Alcohol Use Yes     Social History   Substance and Sexual Activity  Drug Use No   Comment: history of cocaine per Dr Ileene Hutchinson H&P    Social History   Socioeconomic History  . Marital status: Divorced    Spouse name: Not on file  . Number of children: Not on file  . Years of education: Not on file  . Highest education level: Not on file  Occupational History  . Not on file  Social Needs  . Financial resource strain: Not on file  . Food insecurity    Worry: Not on file    Inability: Not on file  . Transportation needs    Medical: Not on file    Non-medical: Not on file  Tobacco Use  . Smoking status: Current Every Day Smoker    Packs/day: 1.00  Years: 20.00    Pack years: 20.00    Types: Cigarettes  . Smokeless tobacco: Never Used  Substance and Sexual Activity  . Alcohol use: Yes  . Drug use: No    Comment: history of cocaine per Dr Mikey Bussing H&P  . Sexual activity: Yes    Birth control/protection: None  Lifestyle  . Physical activity    Days per week: Not on file    Minutes per session: Not on file  . Stress: Not on file  Relationships  . Social Musician on phone: Not on file    Gets together: Not on file    Attends religious service: Not on file    Active member of club or organization: Not on file    Attends meetings of clubs or organizations: Not on file    Relationship status: Not on file  Other Topics Concern  . Not on file  Social History  Narrative  . Not on file   Additional Social History:                         Sleep: Good  Appetite:  Fair  Current Medications: Current Facility-Administered Medications  Medication Dose Route Frequency Provider Last Rate Last Dose  . acetaminophen (TYLENOL) tablet 650 mg  650 mg Oral Q6H PRN Clement Sayres, MD   650 mg at 09/30/19 0981  . alum & mag hydroxide-simeth (MAALOX/MYLANTA) 200-200-20 MG/5ML suspension 30 mL  30 mL Oral Q4H PRN Cristofano, Worthy Rancher, MD      . Melene Muller ON 10/02/2019] ARIPiprazole (ABILIFY) tablet 20 mg  20 mg Oral Daily Money, Travis B, FNP      . divalproex (DEPAKOTE ER) 24 hr tablet 1,500 mg  1,500 mg Oral Q supper Cristofano, Worthy Rancher, MD   1,500 mg at 09/30/19 1628  . gabapentin (NEURONTIN) capsule 300 mg  300 mg Oral TID Clement Sayres, MD   300 mg at 10/01/19 1202  . ibuprofen (ADVIL) tablet 800 mg  800 mg Oral Q8H PRN Money, Feliz Beam B, FNP      . magnesium hydroxide (MILK OF MAGNESIA) suspension 30 mL  30 mL Oral Daily PRN Cristofano, Worthy Rancher, MD      . traZODone (DESYREL) tablet 200 mg  200 mg Oral QHS PRN Cristofano, Worthy Rancher, MD   200 mg at 10/01/19 0021    Lab Results:  Results for orders placed or performed during the hospital encounter of 09/29/19 (from the past 48 hour(s))  SARS CORONAVIRUS 2 (TAT 6-24 HRS) Nasopharyngeal Nasopharyngeal Swab     Status: None   Collection Time: 09/29/19  3:11 PM   Specimen: Nasopharyngeal Swab  Result Value Ref Range   SARS Coronavirus 2 NEGATIVE NEGATIVE    Comment: (NOTE) SARS-CoV-2 target nucleic acids are NOT DETECTED. The SARS-CoV-2 RNA is generally detectable in upper and lower respiratory specimens during the acute phase of infection. Negative results do not preclude SARS-CoV-2 infection, do not rule out co-infections with other pathogens, and should not be used as the sole basis for treatment or other patient management decisions. Negative results must be combined with clinical  observations, patient history, and epidemiological information. The expected result is Negative. Fact Sheet for Patients: HairSlick.no Fact Sheet for Healthcare Providers: quierodirigir.com This test is not yet approved or cleared by the [place] FDA and  has been authorized for detection and/or diagnosis of SARS-CoV-2 by FDA under an Emergency Use Authorization (EUA). This  EUA will remain  in effect (meaning this test can be used) for the duration of the COVID-19 declaration under Section 56 4(b)(1) of the Act, 21 U.S.C. section 360bbb-3(b)(1), unless the authorization is terminated or revoked sooner. Performed at [facility], [address] [place], [place] [address]     Blood Alcohol level:  Lab Results  Component Value Date   ETH <10 09/16/2019   ETH <10 09/18/2018    Metabolic Disorder Labs: No results found for: HGBA1C, MPG No results found for: PROLACTIN No results found for: CHOL, TRIG, HDL, CHOLHDL, VLDL, LDLCALC  Physical Findings: AIMS:  , ,  ,  ,    CIWA:    COWS:     Musculoskeletal: Strength & Muscle Tone: within normal limits Gait & Station: normal Patient leans: N/A  Psychiatric Specialty Exam: Physical Exam  Nursing note and vitals reviewed. Constitutional: He is oriented to person, place, and time. He appears well-developed and well-nourished.  Respiratory: Effort normal.  Musculoskeletal: Normal range of motion.  Neurological: He is alert and oriented to person, place, and time.  Skin: Skin is warm.    Review of Systems  Constitutional: Negative.   HENT: Negative.   Eyes: Negative.   Respiratory: Negative.   Cardiovascular: Negative.   Gastrointestinal: Negative.   Genitourinary: Negative.   Musculoskeletal: Positive for back pain.  Skin: Negative.   Neurological: Negative.   Endo/Heme/Allergies: Negative.   Psychiatric/Behavioral: Positive for hallucinations.    Blood  pressure 120/84, pulse (!) 108, temperature 98.1 F (36.7 C), temperature source Oral, resp. rate 17, height 5\' 9"  (1.753 m), weight 103.9 kg, SpO2 99 %.Body mass index is 33.83 kg/m.  General Appearance: Disheveled  Eye Contact:  Minimal  Speech:  Clear and Coherent and Normal Rate  Volume:  Normal  Mood:  Euthymic  Affect:  Flat  Thought Process:  Coherent and Descriptions of Associations: Intact  Orientation:  Full (Time, Place, and Person)  Thought Content:  Hallucinations: Auditory  Suicidal Thoughts:  No  Homicidal Thoughts:  No  Memory:  Immediate;   Fair Recent;   Fair Remote;   Fair  Judgement:  Fair  Insight:  Fair  Psychomotor Activity:  Normal  Concentration:  Concentration: Good  Recall:  Fair  Fund of Knowledge:  Fair  Language:  Fair  Akathisia:  No  Handed:  Right  AIMS (if indicated):     Assets:  Communication Skills Desire for Improvement Financial Resources/Insurance Resilience  ADL's:  Intact  Cognition:  WNL  Sleep:  Number of Hours: 7.5   Assessment: Patient presents in his bed lying down with his eyes closed but is awakened easily.  Patient is pleasant, calm, cooperative.  Patient has minimal eye contact and flat affect.  He presents as though he would prefer to be left alone.  When asked about his suicidal ideations he stated no but then states "well I have been asleep all day."  Patient does seem disinterested in any conversation about his mental health.  He seems to be focused on his back pain and states that the Neurontin has helped some but continues to state that his back is hurting significantly from him falling into the [facility].  Treatment Plan Summary: Daily contact with patient to assess and evaluate symptoms and progress in treatment and Medication management Increase Abilify to 20 mg p.o. daily for bipolar affective disorder with psychotic behavior Continue Depakote ER 1500 mg p.o. with supper daily for mood stability and bipolar affective  disorder Continue Neurontin 300 mg p.o. 3 times daily for back pain Continue trazodone 200 mg p.o. nightly as needed for insomnia and mood stability Start ibuprofen 800 mg every 8 hours as needed for back pain and moderate pain Encourage group therapy participation Continue every 15 minute safety checks  Maryfrances Bunnellravis B Money, FNP 10/01/2019, 2:23 PM

## 2019-10-02 MED ORDER — ARIPIPRAZOLE 10 MG PO TABS
30.0000 mg | ORAL_TABLET | Freq: Every day | ORAL | Status: DC
  Administered 2019-10-03 – 2019-10-04 (×2): 30 mg via ORAL
  Filled 2019-10-02 (×3): qty 3

## 2019-10-02 NOTE — Progress Notes (Signed)
Recreation Therapy Notes   Date: 10/02/2019  Time: 9:30 am   Location: Craft room   Behavioral response: N/A   Intervention Topic: Leisure  Discussion/Intervention: Patient did not attend group.   Clinical Observations/Feedback:  Patient did not attend group.   Heath Badon LRT/CTRS        Chermaine Schnyder 10/02/2019 11:12 AM

## 2019-10-02 NOTE — Progress Notes (Signed)
D: Affect cheerful on approach .  Limited Interacting  With peers and staff . Patient  Remained in bed during shift  Up only for  Meals .  Patient stated slept fair last night .Stated appetite good and energy level low. Stated concentration is good . Stated on Depression scale 7 , hopeless 8 and anxiety 6  .( low 0-10 high) Denies suicidal  homicidal ideations  . Denies auditory hallucinations  No pain concerns .No  ADL'S. Interacting with peers and staff.   A: Encourage patient participation with unit programming . Instruction  Given on  Medication , verbalize understanding.  R: Voice no other concerns. Staff continue to monitor

## 2019-10-02 NOTE — BHH Group Notes (Signed)
  LCSW Group Therapy Note  10/02/2019 2:12 PM   Type of Therapy/Topic:  Group Therapy:  Feelings about Diagnosis  Participation Level:  Did Not Attend   Description of Group:   This group will allow patients to explore their thoughts and feelings about diagnoses they have received. Patients will be guided to explore their level of understanding and acceptance of these diagnoses. Facilitator will encourage patients to process their thoughts and feelings about the reactions of others to their diagnosis and will guide patients in identifying ways to discuss their diagnosis with significant others in their lives. This group will be process-oriented, with patients participating in exploration of their own experiences, giving and receiving support, and processing challenge from other group members.   Therapeutic Goals: 1. Patient will demonstrate understanding of diagnosis as evidenced by identifying two or more symptoms of the disorder 2. Patient will be able to express two feelings regarding the diagnosis 3. Patient will demonstrate their ability to communicate their needs through discussion and/or role play  Summary of Patient Progress: x   Therapeutic Modalities:   Cognitive Behavioral Therapy Brief Therapy Feelings Identification    Evalina Field, MSW, LCSW Clinical Social Work 10/02/2019 2:12 PM

## 2019-10-02 NOTE — Progress Notes (Signed)
[facility]  10/02/2019 3:29 PM Pryor Curiaric E Turnley  MRN:  [id number] Subjective: Patient seen chart reviewed.  Patient continues to stay in bed most of the day.  I came to see him in the mid afternoon and he was in bed although not asleep.  He tells me he is still hearing voices although they are slightly better than when he came in.  He does get up to eat but otherwise has not normal forming groups or interacting very much.  No complaints about the medicine.  Appears to be tolerating treatment well.  Denies acute suicidal thought. Principal Problem: Bipolar affective disorder, mixed, severe, with psychotic behavior ([facility]) Diagnosis: Principal Problem:   Bipolar affective disorder, mixed, severe, with psychotic behavior ([facility]) Active Problems:   Back pain   Arthritis  Total Time spent with patient: 30 minutes  Past Psychiatric History: Patient has a past history of substance abuse and psychotic symptoms  Past Medical History:  Past Medical History:  Diagnosis Date  . 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 Anginal pain ([facility])   . Arthritis   . Bipolar 1 disorder ([facility])   . Depressed   . Dyspnea   . Fatty liver   . Schizophrenia [facility])     Past Surgical History:  Procedure Laterality Date  . COLONOSCOPY  02/05/2014   diverticulosis, hyperplastic polyp x 2  . fracture arm Right   . KNEE SURGERY Right 2013  . NASAL SEPTUM SURGERY    . NASAL TURBINATE REDUCTION Bilateral 12/12/2016   Procedure: TURBINATE REDUCTION/SUBMUCOSAL RESECTION;  Surgeon: Linus Salmonshapman [name], MD;  Location: [facility] ORS;  Service: ENT;  Laterality: Bilateral;   Family History:  Family History  Problem Relation Age of Onset  . Hepatitis C Mother   . Cancer Mother   . Cancer Maternal Aunt   . Hypertension Maternal Grandmother    Family Psychiatric  History: See previous Social History:  Social History   Substance and Sexual Activity  Alcohol Use Yes     Social History   Substance and Sexual Activity   Drug Use No   Comment: history of cocaine per Dr Mikey BussingMcQueen's H&P    Social History   Socioeconomic History  . Marital status: Divorced    Spouse name: Not on file  . Number of children: Not on file  . Years of education: Not on file  . Highest education level: Not on file  Occupational History  . Not on file  Tobacco Use  . Smoking status: Current Every Day Smoker    Packs/day: 1.00    Years: 20.00    Pack years: 20.00    Types: Cigarettes  . Smokeless tobacco: Never Used  Substance and Sexual Activity  . Alcohol use: Yes  . Drug use: No    Comment: history of cocaine per Dr Mikey BussingMcQueen's H&P  . Sexual activity: Yes    Birth control/protection: None  Other Topics Concern  . Not on file  Social History Narrative  . Not on file   Social Determinants of Health   Financial Resource Strain:   . Difficulty of Paying Living Expenses: Not on file  Food Insecurity:   . Worried About Programme researcher, broadcasting/film/videounning Out of Food in the Last Year: Not on file  . Ran Out of Food in the Last Year: Not on file  Transportation Needs:   . Lack of Transportation (Medical): Not on file  . Lack of Transportation (Non-Medical): Not on file  Physical Activity:   .  Days of Exercise per Week: Not on file  . Minutes of Exercise per Session: Not on file  Stress:   . Feeling of Stress : Not on file  Social Connections:   . Frequency of Communication with Friends and Family: Not on file  . Frequency of Social Gatherings with Friends and Family: Not on file  . Attends Religious Services: Not on file  . Active Member of Clubs or Organizations: Not on file  . Attends Archivist Meetings: Not on file  . Marital Status: Not on file   Additional Social History:                         Sleep: Fair  Appetite:  Fair  Current Medications: Current Facility-Administered Medications  Medication Dose Route Frequency Provider Last Rate Last Admin  . acetaminophen (TYLENOL) tablet 650 mg  650 mg Oral Q6H  PRN Dixie Dials, MD   650 mg at 09/30/19 1696  . alum & mag hydroxide-simeth (MAALOX/MYLANTA) 200-200-20 MG/5ML suspension 30 mL  30 mL Oral Q4H PRN Cristofano, Dorene Ar, MD      . Derrill Memo ON 10/03/2019] ARIPiprazole (ABILIFY) tablet 30 mg  30 mg Oral Daily Meghna Hagmann T, MD      . divalproex (DEPAKOTE ER) 24 hr tablet 1,500 mg  1,500 mg Oral Q supper Cristofano, Dorene Ar, MD   1,500 mg at 10/01/19 1825  . gabapentin (NEURONTIN) capsule 300 mg  300 mg Oral TID Dixie Dials, MD   300 mg at 10/02/19 1237  . ibuprofen (ADVIL) tablet 800 mg  800 mg Oral Q8H PRN Money, Lowry Ram, FNP      . magnesium hydroxide (MILK OF MAGNESIA) suspension 30 mL  30 mL Oral Daily PRN Cristofano, Dorene Ar, MD      . traZODone (DESYREL) tablet 200 mg  200 mg Oral QHS PRN Cristofano, Dorene Ar, MD   200 mg at 10/01/19 2112    Lab Results: No results found for this or any previous visit (from the past 48 hour(s)).  Blood Alcohol level:  Lab Results  Component Value Date   ETH <10 09/16/2019   ETH <10 78/93/8101    Metabolic Disorder Labs: No results found for: HGBA1C, MPG No results found for: PROLACTIN No results found for: CHOL, TRIG, HDL, CHOLHDL, VLDL, LDLCALC  Physical Findings: AIMS:  , ,  ,  ,    CIWA:    COWS:     Musculoskeletal: Strength & Muscle Tone: within normal limits Gait & Station: normal Patient leans: N/A  Psychiatric Specialty Exam: Physical Exam  Nursing note and vitals reviewed. Constitutional: He appears well-developed and well-nourished.  HENT:  Head: Normocephalic and atraumatic.  Eyes: Pupils are equal, round, and reactive to light. Conjunctivae are normal.  Cardiovascular: Regular rhythm and normal heart sounds.  Respiratory: Effort normal.  GI: Soft.  Musculoskeletal:        General: Normal range of motion.     Cervical back: Normal range of motion.  Neurological: He is alert.  Skin: Skin is warm and dry.  Psychiatric: His affect is blunt. His speech is delayed.  He is slowed and actively hallucinating. Thought content is paranoid. Cognition and memory are impaired. He expresses impulsivity.    Review of Systems  Constitutional: Negative.   HENT: Negative.   Eyes: Negative.   Respiratory: Negative.   Cardiovascular: Negative.   Gastrointestinal: Negative.   Musculoskeletal: Negative.   Skin: Negative.  Neurological: Negative.   Psychiatric/Behavioral: Positive for agitation, confusion, dysphoric mood and hallucinations. Negative for behavioral problems.    Blood pressure 126/79, pulse (!) 106, temperature 97.8 F (36.6 C), temperature source Oral, resp. rate 17, height 5\' 9"  (1.753 m), weight 103.9 kg, SpO2 99 %.Body mass index is 33.83 kg/m.  General Appearance: Disheveled  Eye Contact:  Minimal  Speech:  Slow  Volume:  Decreased  Mood:  Anxious, Depressed and Dysphoric  Affect:  Congruent  Thought Process:  Coherent  Orientation:  Full (Time, Place, and Person)  Thought Content:  Logical and Hallucinations: Auditory  Suicidal Thoughts:  No  Homicidal Thoughts:  No  Memory:  Immediate;   Fair Recent;   Poor Remote;   Fair  Judgement:  Fair  Insight:  Shallow  Psychomotor Activity:  Decreased  Concentration:  Concentration: Poor  Recall:  of Knowledge:  Fair  Language:  Fair  Akathisia:  No  Handed:  Right  AIMS (if indicated):     Assets:  Desire for Improvement Housing Physical Health  ADL's:  Impaired  Cognition:  Impaired,  Mild  Sleep:  Number of Hours: 8.25     Treatment Plan Summary: Daily contact with patient to assess and evaluate symptoms and progress in treatment, Medication management and Plan Patient continues to endorse hallucinations.  This certainly is likely to be part of the problem although staff also have the impression that the patient is just intentionally and voiding treatment and is possibly likely to hold out his hospitalization longer due to homelessness.  I encouraged him to please get  up out of bed go outdoors during activity time be more active.  I will increase his Abilify to 30 mg for antipsychotic benefit.  [employer], MD 10/02/2019, 3:29 PM

## 2019-10-02 NOTE — BHH Group Notes (Signed)
[place] Group Notes:  (Nursing/MHT/Case Management/Adjunct)  Date:  10/02/2019  Time:  8:33 PM  Type of Therapy:  Group Therapy  Participation Level:  Did Not Attend  Summary of Progress/Problems:  Cory Floyd 10/02/2019, 8:33 PM

## 2019-10-02 NOTE — Plan of Care (Signed)
Patient remained  in bed  no unit programing . Emotional and mental status  improving, No safety concerns . Encourage  patient to get adequate amount of sleep and rest. Thought process  remains  altered . Problem: Education: Goal: Knowledge of [place] [facility] Education information/materials will improve Outcome: Progressing Goal: Emotional status will improve Outcome: Progressing Goal: Mental status will improve Outcome: Progressing Goal: Verbalization of understanding the information provided will improve Outcome: Progressing   Problem: Safety: Goal: Periods of time without injury will increase Outcome: Progressing   Problem: Activity: Goal: Will verbalize the importance of balancing activity with adequate rest periods Outcome: Progressing   Problem: Education: Goal: Will be free of psychotic symptoms Outcome: Progressing

## 2019-10-03 MED ORDER — MIRTAZAPINE 15 MG PO TABS
15.0000 mg | ORAL_TABLET | Freq: Every day | ORAL | Status: DC
  Administered 2019-10-03 – 2019-10-06 (×4): 15 mg via ORAL
  Filled 2019-10-03 (×4): qty 1

## 2019-10-03 NOTE — Plan of Care (Signed)
Patient stayed in bed all day stated that he is tired and could not sleep last night.Denies SI,HI and AVH.Compliant with medications.Did not attend groups.Appetite good.Support and encouragement given.

## 2019-10-03 NOTE — Progress Notes (Signed)
Patient alert and oriented x 4, he denies SI/HI/AVH, he is anxious not irritable, receptive to staff , his thoughts are organized and coherent, interacting appropriately with peers and staff, complaint with medication and attended evening wrap up group. 15 minutes safety checks maintained will continue to monitor.

## 2019-10-03 NOTE — Progress Notes (Signed)
Recreation Therapy Notes   Date: 10/03/2019  Time: 9:30 am   Location: Craft room   Behavioral response: N/A   Intervention Topic: Relaxation   Discussion/Intervention: Patient did not attend group.   Clinical Observations/Feedback:  Patient did not attend group.   Atari Novick LRT/CTRS        Tresea Heine 10/03/2019 10:49 AM 

## 2019-10-03 NOTE — BHH Group Notes (Signed)
LCSW Group Therapy Note  10/03/2019 1:00 PM  Type of Therapy and Topic:  Group Therapy:  Feelings around Relapse and Recovery  Participation Level:  Did Not Attend   Description of Group:    Patients in this group will discuss emotions they experience before and after a relapse. They will process how experiencing these feelings, or avoidance of experiencing them, relates to having a relapse. Facilitator will guide patients to explore emotions they have related to recovery. Patients will be encouraged to process which emotions are more powerful. They will be guided to discuss the emotional reaction significant others in their lives may have to their relapse or recovery. Patients will be assisted in exploring ways to respond to the emotions of others without this contributing to a relapse.  Therapeutic Goals: 1. Patient will identify two or more emotions that lead to a relapse for them 2. Patient will identify two emotions that result when they relapse 3. Patient will identify two emotions related to recovery 4. Patient will demonstrate ability to communicate their needs through discussion and/or role plays   Summary of Patient Progress: X  Therapeutic Modalities:   Cognitive Behavioral Therapy Solution-Focused Therapy Assertiveness Training Relapse Prevention Therapy   Arminda Foglio, MSW, LCSW 10/03/2019 12:50 PM 

## 2019-10-03 NOTE — Progress Notes (Signed)
[facility] MD Progress Note  10/03/2019 3:15 PM Cory Floyd  MRN:  [id number]   Subjective: Follow-up for patient with bipolar affective disorder.  Patient reports today that he feels very tired.  He states that he took the trazodone last night at bedtime but was unable to sleep any.  He states that he has been taking Remeron and it works better than the trazodone and would like to go back on the Remeron.  He denies having any suicidal or homicidal ideations today and states that he feels that his mood is improving some.  He also reports that he has been having some auditory hallucinations but they are also improving and are getting less and less with each day.  Patient states that "they will probably go on in a couple of days."  Patient shows no signs or symptoms of medication side effects.    Principal Problem: Bipolar affective disorder, mixed, severe, with psychotic behavior ([place]) Diagnosis: Principal Problem:   Bipolar affective disorder, mixed, severe, with psychotic behavior ([place]) Active Problems:   Back pain   Arthritis  Total Time spent with patient: 30 minutes  Past Psychiatric History: Patient has a past history of psychiatric disorder with a diagnosis of bipolar or schizoaffective disorder.  He was just recently in the hospital up till about a week ago and was discharged with the plan that he would be following up with [facility] and relying on his peers support.  He had been vague at that time about where he was staying but had felt that he would be able to find someone to stay with.  Has a history of noncompliance and of falling into the sort of psychotic states when off of medicine.  Not typically abusing substances  Past Medical History:  Past Medical History:  Diagnosis Date  . 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 Anginal pain ([place])   . Arthritis   . Bipolar 1 disorder ([place])   . Depressed   . Dyspnea   . Fatty liver   . Schizophrenia [facility])     Past Surgical  History:  Procedure Laterality Date  . COLONOSCOPY  02/05/2014   diverticulosis, hyperplastic polyp x 2  . fracture arm Right   . KNEE SURGERY Right 2013  . NASAL SEPTUM SURGERY    . NASAL TURBINATE REDUCTION Bilateral 12/12/2016   Procedure: TURBINATE REDUCTION/SUBMUCOSAL RESECTION;  Surgeon: Beverly Gust, MD;  Location: [facility] ORS;  Service: ENT;  Laterality: Bilateral;   Family History:  Family History  Problem Relation Age of Onset  . Hepatitis C Mother   . Cancer Mother   . Cancer Maternal Aunt   . Hypertension Maternal Grandmother    Family Psychiatric  History: See previous notes family history of alcohol abuse Social History:  Social History   Substance and Sexual Activity  Alcohol Use Yes     Social History   Substance and Sexual Activity  Drug Use No   Comment: history of cocaine per Dr Ileene Hutchinson H&P    Social History   Socioeconomic History  . Marital status: Divorced    Spouse name: Not on file  . Number of children: Not on file  . Years of education: Not on file  . Highest education level: Not on file  Occupational History  . Not on file  Tobacco Use  . Smoking status: Current Every Day Smoker    Packs/day: 1.00    Years: 20.00    Pack years: 20.00  Types: Cigarettes  . Smokeless tobacco: Never Used  Substance and Sexual Activity  . Alcohol use: Yes  . Drug use: No    Comment: history of cocaine per Dr Mikey BussingMcQueen's H&P  . Sexual activity: Yes    Birth control/protection: None  Other Topics Concern  . Not on file  Social History Narrative  . Not on file   Social Determinants of Health   Financial Resource Strain:   . Difficulty of Paying Living Expenses: Not on file  Food Insecurity:   . Worried About Programme researcher, broadcasting/film/videounning Out of Food in the Last Year: Not on file  . Ran Out of Food in the Last Year: Not on file  Transportation Needs:   . Lack of Transportation (Medical): Not on file  . Lack of Transportation (Non-Medical): Not on file  Physical  Activity:   . Days of Exercise per Week: Not on file  . Minutes of Exercise per Session: Not on file  Stress:   . Feeling of Stress : Not on file  Social Connections:   . Frequency of Communication with Friends and Family: Not on file  . Frequency of Social Gatherings with Friends and Family: Not on file  . Attends Religious Services: Not on file  . Active Member of Clubs or Organizations: Not on file  . Attends BankerClub or Organization Meetings: Not on file  . Marital Status: Not on file   Additional Social History:                         Sleep: Patient reports poor sleep at night but has been in the bed sleeping  most of the day  Appetite:  Good  Current Medications: Current Facility-Administered Medications  Medication Dose Route Frequency Provider Last Rate Last Admin  . acetaminophen (TYLENOL) tablet 650 mg  650 mg Oral Q6H PRN Clement Sayresristofano, Paul A, MD   650 mg at 09/30/19 [id number]  . alum & mag hydroxide-simeth (MAALOX/MYLANTA) 200-200-20 MG/5ML suspension 30 mL  30 mL Oral Q4H PRN Cristofano, Paul A, MD      . ARIPiprazole (ABILIFY) tablet 30 mg  30 mg Oral Daily Clapacs, Jackquline DenmarkJohn T, MD   30 mg at 10/03/19 0751  . divalproex (DEPAKOTE ER) 24 hr tablet 1,500 mg  1,500 mg Oral Q supper Cristofano, Paul A, MD   1,500 mg at 10/02/19 1712  . gabapentin (NEURONTIN) capsule 300 mg  300 mg Oral TID Clement Sayresristofano, Paul A, MD   300 mg at 10/03/19 1205  . ibuprofen (ADVIL) tablet 800 mg  800 mg Oral Q8H PRN Liani Caris, Feliz Beamravis B, FNP      . magnesium hydroxide (MILK OF MAGNESIA) suspension 30 mL  30 mL Oral Daily PRN Cristofano, Worthy RancherPaul A, MD      . mirtazapine (REMERON) tablet 15 mg  15 mg Oral QHS Travoris Bushey, Gerlene Burdockravis B, FNP        Lab Results: No results found for this or any previous visit (from the past 48 hour(s)).  Blood Alcohol level:  Lab Results  Component Value Date   ETH <10 09/16/2019   ETH <10 09/18/2018    Metabolic Disorder Labs: No results found for: HGBA1C, MPG No results found  for: PROLACTIN No results found for: CHOL, TRIG, HDL, CHOLHDL, VLDL, LDLCALC  Physical Findings: AIMS:  , ,  ,  ,    CIWA:    COWS:     Musculoskeletal: Strength & Muscle Tone: within normal limits Gait & Station: normal  Patient leans: N/A  Psychiatric Specialty Exam: Physical Exam  Nursing note and vitals reviewed. Constitutional: He is oriented to person, place, and time. He appears well-developed and well-nourished.  Respiratory: Effort normal.  Musculoskeletal:        General: Normal range of motion.  Neurological: He is alert and oriented to person, place, and time.  Skin: Skin is warm.    Review of Systems  Constitutional: Negative.   HENT: Negative.   Eyes: Negative.   Respiratory: Negative.   Cardiovascular: Negative.   Gastrointestinal: Negative.   Genitourinary: Negative.   Musculoskeletal: Negative.   Skin: Negative.   Neurological: Negative.   Psychiatric/Behavioral: Positive for hallucinations.    Blood pressure 126/86, pulse (!) 106, temperature 97.9 F (36.6 C), temperature source Oral, resp. rate 17, height 5\' 9"  (1.753 m), weight 103.9 kg, SpO2 99 %.Body mass index is 33.83 kg/m.  General Appearance: Disheveled  Eye Contact:  None  Speech:  Clear and Coherent and Normal Rate  Volume:  Decreased  Mood:  Depressed  Affect:  Flat  Thought Process:  Coherent and Descriptions of Associations: Intact  Orientation:  Full (Time, Place, and Person)  Thought Content:  WDL and Hallucinations: Auditory  Suicidal Thoughts:  No  Homicidal Thoughts:  No  Memory:  Immediate;   Fair Recent;   Fair Remote;   Fair  Judgement:  Fair  Insight:  Fair  Psychomotor Activity:  Decreased  Concentration:  Concentration: Fair  Recall:  of Knowledge:  Fair  Language:  Fair  Akathisia:  No  Handed:  Right  AIMS (if indicated):     Assets:  Desire for Improvement Financial Resources/Insurance Physical Health Resilience  ADL's:  Intact  Cognition:  WNL   Sleep:  Number of Hours: 7   Assessment: Patient presents in his room lying in the bed and is asleep.  Patient is awake and fairly easily but continues talking with his eyes closed.  I do not think the patient opened his eyes one time during the entire interview.  Patient has been remaining in his room and lying in the bed most of the day and then complains about difficulty sleeping at night.  He is encouraged to get out of the bed and go outside and attend groups and patient states "I went outside yesterday."  Patient is not willing to interact with groups or staff members.  CSW provided me with a fax from [facility] of the patient's medication list.  As of 07/18/2027 the patient was taking naltrexone 50 mg a day, propranolol 10 mg twice daily, Abilify 10 mg nightly, Remeron 15 mg nightly, and Depakote ER 1000 mg nightly  Treatment Plan Summary: Daily contact with patient to assess and evaluate symptoms and progress in treatment and Medication management Continue Abilify 30 mg p.o. daily for bipolar affective disorder Continue Depakote ER 1500 mg p.o. daily with supper for bipolar affective disorder Continue Neurontin 300 mg p.o. 3 times daily Discontinue trazodone Start Remeron 15 mg p.o. nightly for insomnia  07/20/2027, FNP 10/03/2019, 3:15 PM

## 2019-10-04 ENCOUNTER — Inpatient Hospital Stay: Payer: Medicare Other

## 2019-10-04 DIAGNOSIS — Z6841 Body Mass Index (BMI) 40.0 and over, adult: Secondary | ICD-10-CM

## 2019-10-04 DIAGNOSIS — U071 COVID-19: Secondary | ICD-10-CM

## 2019-10-04 DIAGNOSIS — F250 Schizoaffective disorder, bipolar type: Secondary | ICD-10-CM

## 2019-10-04 LAB — COMPREHENSIVE METABOLIC PANEL
ALT: 29 U/L (ref 0–44)
AST: 22 U/L (ref 15–41)
Albumin: 4 g/dL (ref 3.5–5.0)
Alkaline Phosphatase: 55 U/L (ref 38–126)
Anion gap: 10 (ref 5–15)
BUN: 13 mg/dL (ref 6–20)
CO2: 28 mmol/L (ref 22–32)
Calcium: 9 mg/dL (ref 8.9–10.3)
Chloride: 99 mmol/L (ref 98–111)
Creatinine, Ser: 1.04 mg/dL (ref 0.61–1.24)
GFR calc Af Amer: >60 mL/min (ref >60)
GFR calc non Af Amer: >60 mL/min (ref >60)
Glucose, Bld: 142 mg/dL — ABNORMAL HIGH (ref 70–99)
Potassium: 4.7 mmol/L (ref 3.5–5.1)
Sodium: 137 mmol/L (ref 135–145)
Total Bilirubin: 0.6 mg/dL (ref 0.3–1.2)
Total Protein: 7.6 g/dL (ref 6.5–8.1)

## 2019-10-04 LAB — CBC
HCT: 46.7 % (ref 39.0–52.0)
Hemoglobin: 15.3 g/dL (ref 13.0–17.0)
MCH: 30.7 pg (ref 26.0–34.0)
MCHC: 32.8 g/dL (ref 30.0–36.0)
MCV: 93.6 fL (ref 80.0–100.0)
Platelets: 165 10*3/uL (ref 150–400)
RBC: 4.99 MIL/uL (ref 4.22–5.81)
RDW: 12.6 % (ref 11.5–15.5)
WBC: 5.1 10*3/uL (ref 4.0–10.5)
nRBC: 0 % (ref 0.0–0.2)

## 2019-10-04 LAB — FIBRIN DERIVATIVES D-DIMER (ARMC ONLY): Fibrin derivatives D-dimer (ARMC): 274.38 ng/mL (FEU) (ref 0.00–499.00)

## 2019-10-04 LAB — PROCALCITONIN: Procalcitonin: <0.1 ng/mL

## 2019-10-04 LAB — C-REACTIVE PROTEIN: CRP: 0.8 mg/dL (ref <1.0)

## 2019-10-04 LAB — SARS CORONAVIRUS 2 BY RT PCR (HOSPITAL ORDER, PERFORMED IN ~~LOC~~ HOSPITAL LAB): SARS Coronavirus 2: POSITIVE — AB

## 2019-10-04 LAB — FIBRINOGEN: Fibrinogen: 355 mg/dL (ref 210–475)

## 2019-10-04 MED ORDER — ZINC SULFATE 220 (50 ZN) MG PO CAPS
220.0000 mg | ORAL_CAPSULE | Freq: Every day | ORAL | Status: DC
  Administered 2019-10-04 – 2019-10-06 (×2): 220 mg via ORAL
  Filled 2019-10-04 (×4): qty 1

## 2019-10-04 MED ORDER — ENOXAPARIN SODIUM 40 MG/0.4ML ~~LOC~~ SOLN
40.0000 mg | SUBCUTANEOUS | Status: DC
  Administered 2019-10-04 – 2019-10-06 (×3): 40 mg via SUBCUTANEOUS
  Filled 2019-10-04 (×4): qty 0.4

## 2019-10-04 MED ORDER — ASCORBIC ACID 500 MG PO TABS: 500.0000 mg | ORAL_TABLET | Freq: Every day | ORAL | 0 refills | Status: DC

## 2019-10-04 MED ORDER — ACETAMINOPHEN 325 MG PO TABS: 650.0000 mg | ORAL_TABLET | Freq: Four times a day (QID) | ORAL | 0 refills | Status: DC | PRN

## 2019-10-04 MED ORDER — MIRTAZAPINE 15 MG PO TABS: 15.0000 mg | ORAL_TABLET | Freq: Every day | ORAL | 0 refills | Status: DC

## 2019-10-04 MED ORDER — IBUPROFEN 800 MG PO TABS: 800.0000 mg | ORAL_TABLET | Freq: Three times a day (TID) | ORAL | 0 refills | Status: DC | PRN

## 2019-10-04 MED ORDER — ACETAMINOPHEN 325 MG PO TABS
650.0000 mg | ORAL_TABLET | Freq: Four times a day (QID) | ORAL | Status: DC | PRN
  Administered 2019-10-06 (×2): 650 mg via ORAL
  Filled 2019-10-04 (×3): qty 2

## 2019-10-04 MED ORDER — VITAMIN C 500 MG PO TABS
500.0000 mg | ORAL_TABLET | Freq: Every day | ORAL | Status: DC
  Administered 2019-10-06 – 2019-10-07 (×2): 500 mg via ORAL
  Filled 2019-10-04 (×3): qty 1

## 2019-10-04 MED ORDER — ARIPIPRAZOLE 30 MG PO TABS: 30.0000 mg | ORAL_TABLET | Freq: Every day | ORAL | 0 refills | Status: DC

## 2019-10-04 MED ORDER — GUAIFENESIN-DM 100-10 MG/5ML PO SYRP
10.0000 mL | ORAL_SOLUTION | ORAL | Status: DC | PRN
  Filled 2019-10-04: qty 10

## 2019-10-04 MED ORDER — ZINC SULFATE 220 (50 ZN) MG PO CAPS: 220.0000 mg | ORAL_CAPSULE | Freq: Every day | ORAL | 0 refills | Status: DC

## 2019-10-04 NOTE — Progress Notes (Signed)
D: Bipolar   A: Patient remains in bed . Patient instructed on COVID alert . Instructed to stay in bed  In room  With mask. Swab taken .  Patient continue to voice of being suicidal . Having no place to go  Once he leaves  From here. Compliant  With medication , able to verbalize understanding  Of them.   Patient stated slept good last night .Stated appetite fair and energy level  Is normal. Stated concentration poor . Stated he is depressed  denies suicidal  homicidal ideations  .    Encourage patient participation with unit programming . Instruction  Given on  Medication , verbalize understanding. R: Voice no other concerns. Staff continue to monitor

## 2019-10-04 NOTE — Progress Notes (Signed)
Patient alert and oriented x 4, affect is blunted , he was polite and receptive to staff, he denies SI/HI/AVH, he is anxious not irritable, receptive to staff , his thoughts are organized and coherent, interacting appropriately with peers and staff, complaint with medication and attended evening wrap up group. 15 minutes safety checks maintained will continue to monitor.

## 2019-10-04 NOTE — BHH Group Notes (Signed)
Type of Therapy and Topic:  Group Therapy:  Trust and Honesty 10/04/2019 1:30pm Participation Level:  Did Not Attend   Description of Group:     In this group patients will be asked to explore the value of being honest.  Patients will be guided to discuss their thoughts, feelings, and behaviors related to honesty and trusting in others. Patients will process together how trust and honesty relate to forming relationships with peers, family members, and self. Each patient will be challenged to identify and express feelings of being vulnerable. Patients will discuss reasons why people are dishonest and identify alternative outcomes if one was truthful (to self or others). This group will be process-oriented, with patients participating in exploration of their own experiences, giving and receiving support, and processing challenge from other group members.    Therapeutic Goals:  1.  Patient will identify why honesty is important to relationships and how honesty overall affects relationships.  2.  Patient will identify a situation where they lied or were lied too and the  feelings, thought process, and behaviors surrounding the situation  3.  Patient will identify the meaning of being vulnerable, how that feels, and how that correlates to being honest with self and others.  4.  Patient will identify situations where they could have told the truth, but instead lied and explain reasons of dishonesty.    Summary of Patient Progress:  Announcement made via overhead page, patient did not attend.   Therapeutic Modalities:   Cognitive Behavioral Therapy Solution Focused Therapy Motivational Interviewing Brief Therapy 

## 2019-10-04 NOTE — H&P (Signed)
TRH H&P   Patient Demographics:    Cory Floyd, is a 43 y.o. male  MRN: [id number]   DOB - 27-Nov-1975  Admit Date - 09/30/2019  Outpatient Primary MD for the patient is Margaretann Loveless, MD  Referring MD: Dr Jola Babinski  Outpatient Specialists: none  Patient coming from: behavioral health unit  No chief complaint on file.     HPI:    Cory Floyd  is a 43 y.o. male, who was admitted to behavioral health on 12/7 after he was found by the police to be agitated, paranoid and with disorganized thought.  He has history of bipolar versus schizoaffective disorder.  Patient was hospitalized 2 weeks back for psychotic symptoms as well and discharged on 09/23/19/2020.  At that time he was tested negative for COVID-19.  He was continued on Abilify, Depakote, gabapentin and Remeron while propanolol and naltrexone were discontinued. Patient was participating in group therapy.  As per the primary team, in the ED he was apparently in her room with the patient who was diagnosed with COVID-19.  Yesterday he complained of feeling tired.  He took his trazodone last night but was unable to sleep.  This morning he reported that he felt sluggish and tired and was lying in bed most of the day.  He was last tested for COVID-19 on 12/7 and was negative.  With concern for exposure he was tested again for COVID-19 and is tested positive. Hospitalist consulted for admission to medical floor.   Patient denies any headache, blurred vision, fevers, chills, nausea, vomiting, chest pain, shortness of breath, cough, palpitations, abdominal pain, dysuria, diarrhea, tingling or numbness of extremities or weakness.  He reports feeling tired for past 2 days.  Denies any auditory or visual hallucinations.  He has been adherent to his medications      Review of systems:    In addition to the HPI above,  No Fever-chills, No  Headache, No changes with Vision or hearing, No problems swallowing food or Liquids, No Chest pain, Cough or Shortness of Breath, No Abdominal pain, No Nausea or vomiting, bowel movements are regular, No Blood in stool or Urine, No dysuria, No new skin rashes or bruises, No new joints pains-aches,  Fatigue + +, no tingling, numbness in any extremity, No recent weight gain or loss, No polyuria, polydypsia or polyphagia, No significant Mental Stressors.  A full 10 point Review of Systems was done, except as stated above, all other Review of Systems were negative.   With Past History of the following :    Past Medical History:  Diagnosis Date  . 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 Anginal pain ([facility])   . Arthritis   . Bipolar 1 disorder ([facility])   . Depressed   . Dyspnea   . Fatty liver   . Schizophrenia ([facility])  Past Surgical History:  Procedure Laterality Date  . COLONOSCOPY  02/05/2014   diverticulosis, hyperplastic polyp x 2  . fracture arm Right   . KNEE SURGERY Right 2013  . NASAL SEPTUM SURGERY    . NASAL TURBINATE REDUCTION Bilateral 12/12/2016   Procedure: TURBINATE REDUCTION/SUBMUCOSAL RESECTION;  Surgeon: Linus Salmonshapman [name], MD;  Location: [facility] ORS;  Service: ENT;  Laterality: Bilateral;      Social History:     Social History   Tobacco Use  . Smoking status: Current Every Day Smoker    Packs/day: 1.00    Years: 20.00    Pack years: 20.00    Types: Cigarettes  . Smokeless tobacco: Never Used  Substance Use Topics  . Alcohol use: Yes     Lives -currently homeless  Mobility -independent   Family History :     Family History  Problem Relation Age of Onset  . Hepatitis C Mother   . Cancer Mother   . Cancer Maternal Aunt   . Hypertension Maternal Grandmother       Home Medications:   Prior to Admission medications   Medication Sig Start Date End Date Taking? Authorizing Provider  ARIPiprazole (ABILIFY) 15 MG tablet Take 1 tablet (15 mg  total) by mouth daily. 09/24/19   Starkes-Perry, Juel Burrowakia S, FNP  divalproex (DEPAKOTE ER) 500 MG 24 hr tablet Take 3 tablets (1,500 mg total) by mouth daily with supper. 09/23/19   Starkes-Perry, Juel Burrowakia S, FNP  gabapentin (NEURONTIN) 300 MG capsule Take 1 capsule (300 mg total) by mouth 3 (three) times daily. 09/23/19   Starkes-Perry, Juel Burrowakia S, FNP  meloxicam (MOBIC) 15 MG tablet Take 1 tablet (15 mg total) by mouth daily. 09/24/19   Starkes-Perry, Juel Burrowakia S, FNP  propranolol (INDERAL) 40 MG tablet Take 1 tablet (40 mg total) by mouth daily. 09/24/19   Starkes-Perry, Juel Burrowakia S, FNP  traZODone (DESYREL) 100 MG tablet Take 2 tablets (200 mg total) by mouth at bedtime as needed for sleep. 09/23/19   Maryagnes AmosStarkes-Perry, Takia S, FNP     Allergies:     Allergies  Allergen Reactions  . Carrot [Daucus Carota] Anaphylaxis and Rash  . Penicillins Anaphylaxis and Hives    Has patient had a PCN reaction causing immediate rash, facial/tongue/throat swelling, SOB or lightheadedness with hypotension: Yes Has patient had a PCN reaction causing severe rash involving mucus membranes or skin necrosis: No Has patient had a PCN reaction that required hospitalization Yes Has patient had a PCN reaction occurring within the last 10 years: No If all of the above answers are "NO", then may proceed with Cephalosporin use.      Physical Exam:   Vitals  Blood pressure 118/86, pulse 76, temperature 97.9 F (36.6 C), temperature source Oral, resp. rate 18, height 5\' 9"  (1.753 m), weight 103.9 kg, SpO2 97 %.   General: Middle-aged obese male lying in bed in no acute distress HEENT: Pupils reactive bilaterally, EOMI, no pallor, no icterus, moist mucosa, supple neck, no cervical pain with Chest: Clear to auscultation bilateral CVs: Normal S1-S2, no murmurs rubs or gallop GI: Soft, nondistended, nontender, bowel sounds present Musculoskeletal: Warm, no edema CNS: Alert and oriented, nonfocal   Data Review:    CBC Recent Labs    Lab 09/28/19 2337  WBC 10.5  HGB 15.7  HCT 47.7  PLT 200  MCV 94.5  MCH 31.1  MCHC 32.9  RDW 12.8   ------------------------------------------------------------------------------------------------------------------  Chemistries  Recent Labs  Lab 09/28/19 2337  NA 141  K 4.3  CL 104  CO2 12*  GLUCOSE 192*  BUN 18  CREATININE 1.57*  CALCIUM 9.4   ------------------------------------------------------------------------------------------------------------------ estimated creatinine clearance is 70.6 mL/min (A) (by C-G formula based on SCr of 1.57 mg/dL (H)). ------------------------------------------------------------------------------------------------------------------ No results for input(s): TSH, T4TOTAL, T3FREE, THYROIDAB in the last 72 hours.  Invalid input(s): FREET3  Coagulation profile No results for input(s): INR, PROTIME in the last 168 hours. ------------------------------------------------------------------------------------------------------------------- No results for input(s): DDIMER in the last 72 hours. -------------------------------------------------------------------------------------------------------------------  Cardiac Enzymes No results for input(s): CKMB, TROPONINI, MYOGLOBIN in the last 168 hours.  Invalid input(s): CK ------------------------------------------------------------------------------------------------------------------    Component Value Date/Time   BNP 13.0 09/29/2017 2041     ---------------------------------------------------------------------------------------------------------------  Urinalysis    Component Value Date/Time   COLORURINE YELLOW (A) 09/18/2018 2032   APPEARANCEUR CLEAR (A) 09/18/2018 2032   APPEARANCEUR Clear 10/30/2012 2331   LABSPEC 1.019 09/18/2018 2032   LABSPEC 1.002 10/30/2012 2331   PHURINE 5.0 09/18/2018 2032   GLUCOSEU NEGATIVE 09/18/2018 2032   GLUCOSEU Negative 10/30/2012 2331   HGBUR  SMALL (A) 09/18/2018 2032   BILIRUBINUR NEGATIVE 09/18/2018 2032   BILIRUBINUR Negative 10/30/2012 2331   KETONESUR NEGATIVE 09/18/2018 2032   PROTEINUR NEGATIVE 09/18/2018 2032   UROBILINOGEN 1.0 02/01/2012 2327   NITRITE NEGATIVE 09/18/2018 2032   LEUKOCYTESUR NEGATIVE 09/18/2018 2032   LEUKOCYTESUR Negative 10/30/2012 2331    ----------------------------------------------------------------------------------------------------------------   Imaging Results:    No results found.  My personal review of EKG: None   Assessment & Plan:    Principal Problem: COVID-19 virus infection Admit to MedSurg.  Check inflammatory markers including CRP, D-dimer, fibrinogen, procalcitonin.  Check CBC and comprehensive metabolic panel, chest x-ray. Patient maintaining sats on room air.  Does not need steroid, Actemra or remdesivir unless he has active respiratory symptoms or high inflammatory biomarkers.  Continue airborne and droplet precaution.  As needed antitussives.  Added vitamin C and zinc.  Patient is not acutely psychotic and compliant with treatment.  Please tested positive for COVID-19 and does not appear to have active symptoms at this time.  However , patient is currently homeless and cannot be discharged anywhere or placed into a shelter given his active COVID-19 infection status.  He will be transferred to medical floor.     Bipolar affective disorder, mixed, severe, with psychotic behavior ([facility]) Admitted with acute psychosis.  On Depakote and Abilify which is continued.  Continue Neurontin.  Also on Remeron at bedtime. We will ask psych consult to follow while patient in the hospital.  He cannot get any group therapy due to COVID-19 status.   Active Problems: Acute kidney injury Noted on admission with creatinine of 1.57.  Will monitor for now    Morbid obesity with BMI of 40.0-44.9, adult (HCC) Needs counseling on weight loss and exercise    Obstructive sleep apnea Not on  CPAP      DVT Prophylaxis: Subcu Lovenox  AM Labs Ordered, also please review Full Orders  Family Communication: Admission, patients condition and plan of care including tests being ordered have been discussed with the patient  Code Status full code  Likely DC to: Undetermined patient  Condition: Fair  Consults called: None.  Psych consult to follow while in the hospital  Admission status: inpatient  Patient diagnosed with COVID-19 infection, symptoms of fatigue and sluggishness.  He is at high risk of going into respiratory decompensation for which he needs to be monitored closely in an inpatient setting for >2 midnight.  Time  spent in minutes : 50   Jarick Harkins M.D on 10/04/2019 at 5:31 PM  Between 7am to 7pm - Pager - [phone]. After 7pm go to [email or web address] - password [facility]  [facility] - Office  [phone]

## 2019-10-04 NOTE — Tx Team (Signed)
Interdisciplinary Treatment and Diagnostic Plan Update  10/04/2019 Time of Session: 200pm [name] MRN: [id number]  Principal Diagnosis: Bipolar affective disorder, mixed, severe, with psychotic behavior ([place])  Secondary Diagnoses: Principal Problem:   Bipolar affective disorder, mixed, severe, with psychotic behavior ([place]) Active Problems:   Back pain   Arthritis   Current Medications:  Current Facility-Administered Medications  Medication Dose Route Frequency Provider Last Rate Last Admin  . acetaminophen (TYLENOL) tablet 650 mg  650 mg Oral Q6H PRN Dixie Dials, MD   650 mg at 09/30/19 4034  . alum & mag hydroxide-simeth (MAALOX/MYLANTA) 200-200-20 MG/5ML suspension 30 mL  30 mL Oral Q4H PRN Cristofano, Paul A, MD      . ARIPiprazole (ABILIFY) tablet 30 mg  30 mg Oral Daily Clapacs, [name], MD   30 mg at 10/04/19 0743  . divalproex (DEPAKOTE ER) 24 hr tablet 1,500 mg  1,500 mg Oral Q supper Cristofano, Paul A, MD   1,500 mg at 10/03/19 1712  . gabapentin (NEURONTIN) capsule 300 mg  300 mg Oral TID Dixie Dials, MD   300 mg at 10/04/19 0743  . ibuprofen (ADVIL) tablet 800 mg  800 mg Oral Q8H PRN Money, Lowry Ram, FNP   800 mg at 10/03/19 2036  . magnesium hydroxide (MILK OF MAGNESIA) suspension 30 mL  30 mL Oral Daily PRN Cristofano, Paul A, MD      . mirtazapine (REMERON) tablet 15 mg  15 mg Oral QHS Money, Lowry Ram, FNP   15 mg at 10/03/19 2130   PTA Medications: Medications Prior to Admission  Medication Sig Dispense Refill Last Dose  . ARIPiprazole (ABILIFY) 15 MG tablet Take 1 tablet (15 mg total) by mouth daily. 15 tablet 0   . divalproex (DEPAKOTE ER) 500 MG 24 hr tablet Take 3 tablets (1,500 mg total) by mouth daily with supper. 45 tablet 0   . gabapentin (NEURONTIN) 300 MG capsule Take 1 capsule (300 mg total) by mouth 3 (three) times daily. 45 capsule 0   . meloxicam (MOBIC) 15 MG tablet Take 1 tablet (15 mg total) by mouth daily. 15 tablet 0   .  propranolol (INDERAL) 40 MG tablet Take 1 tablet (40 mg total) by mouth daily. 15 tablet 0   . traZODone (DESYREL) 100 MG tablet Take 2 tablets (200 mg total) by mouth at bedtime as needed for sleep. 15 tablet 0     Patient Stressors: Health problems Medication change or noncompliance Substance abuse  Patient Strengths: Motivation for treatment/growth Supportive family/friends  Treatment Modalities: Medication Management, Group therapy, Case management,  1 to 1 session with clinician, Psychoeducation, Recreational therapy.   Physician Treatment Plan for Primary Diagnosis: Bipolar affective disorder, mixed, severe, with psychotic behavior ([place]) Long Term Goal(s): Improvement in symptoms so as ready for discharge Improvement in symptoms so as ready for discharge   Short Term Goals: Ability to verbalize feelings will improve Ability to demonstrate self-control will improve Ability to identify and develop effective coping behaviors will improve Ability to maintain clinical measurements within normal limits will improve Compliance with prescribed medications will improve  Medication Management: Evaluate patient's response, side effects, and tolerance of medication regimen.  Therapeutic Interventions: 1 to 1 sessions, Unit Group sessions and Medication administration.  Evaluation of Outcomes: Progressing  Physician Treatment Plan for Secondary Diagnosis: Principal Problem:   Bipolar affective disorder, mixed, severe, with psychotic behavior ([place]) Active Problems:   Back pain   Arthritis  Long Term Goal(s): Improvement in  symptoms so as ready for discharge Improvement in symptoms so as ready for discharge   Short Term Goals: Ability to verbalize feelings will improve Ability to demonstrate self-control will improve Ability to identify and develop effective coping behaviors will improve Ability to maintain clinical measurements within normal limits will improve Compliance with  prescribed medications will improve     Medication Management: Evaluate patient's response, side effects, and tolerance of medication regimen.  Therapeutic Interventions: 1 to 1 sessions, Unit Group sessions and Medication administration.  Evaluation of Outcomes: Progressing   RN Treatment Plan for Primary Diagnosis: Bipolar affective disorder, mixed, severe, with psychotic behavior ([facility]) Long Term Goal(s): Knowledge of disease and therapeutic regimen to maintain health will improve  Short Term Goals: Ability to demonstrate self-control, Ability to participate in decision making will improve, Ability to identify and develop effective coping behaviors will improve and Compliance with prescribed medications will improve  Medication Management: RN will administer medications as ordered by provider, will assess and evaluate patient's response and provide education to patient for prescribed medication. RN will report any adverse and/or side effects to prescribing provider.  Therapeutic Interventions: 1 on 1 counseling sessions, Psychoeducation, Medication administration, Evaluate responses to treatment, Monitor vital signs and CBGs as ordered, Perform/monitor CIWA, COWS, AIMS and Fall Risk screenings as ordered, Perform wound care treatments as ordered.  Evaluation of Outcomes: Progressing   LCSW Treatment Plan for Primary Diagnosis: Bipolar affective disorder, mixed, severe, with psychotic behavior ([facility]) Long Term Goal(s): Safe transition to appropriate next level of care at discharge, Engage patient in therapeutic group addressing interpersonal concerns.  Short Term Goals: Engage patient in aftercare planning with referrals and resources and Increase skills for wellness and recovery  Therapeutic Interventions: Assess for all discharge needs, 1 to 1 time with Social worker, Explore available resources and support systems, Assess for adequacy in community support network, Educate family and  significant other(s) on suicide prevention, Complete Psychosocial Assessment, Interpersonal group therapy.  Evaluation of Outcomes: Progressing  Progress in Treatment: Attending groups: No. Participating in groups: No. Taking medication as prescribed: Yes. Toleration medication: Yes. Family/Significant other contact made: No, will contact:  pt declined collateral contact Patient understands diagnosis: Yes. Discussing patient identified problems/goals with staff: No. Medical problems stabilized or resolved: Yes. Denies suicidal/homicidal ideation: No. Issues/concerns per patient self-inventory: No. Other: N/A  New problem(s) identified: No, Describe:  none  New Short Term/Long Term Goal(s): Detox, elimination of AVH/symptoms of psychosis, medication management for mood stabilization; elimination of SI thoughts; development of comprehensive mental wellness/sobriety plan.   Patient Goals:  "get my shit together, get out of here, and move on with my life"  Discharge Plan or Barriers: SPE pamphlet, Mobile Crisis information, and AA/NA information provided to patient for additional community support and resources. Pt agreeable to follow up with [facility].  Reason for Continuation of Hospitalization: Medication stabilization  Estimated Length of Stay: Discharging Monday, 12/14  Attendees: Patient: Cory Floyd 10/04/2019 10:24 AM  Physician:  10/04/2019 10:24 AM  Nursing:  10/04/2019 10:24 AM  RN Care Manager: 10/04/2019 10:24 AM  Social Worker: Zollie Scale Moton LCSW 10/04/2019 10:24 AM  Recreational Therapist:  10/04/2019 10:24 AM  Other: Enid Cutter, LCSWA 10/04/2019 10:24 AM  Other:  10/04/2019 10:24 AM  Other: 10/04/2019 10:24 AM    Scribe for Treatment Team: Darreld Mclean, LCSWA 10/04/2019 10:24 AM

## 2019-10-04 NOTE — Progress Notes (Signed)
Patient  Instructed on positive test for COVID  Seen by [facility]

## 2019-10-04 NOTE — Plan of Care (Signed)
Patient remained  in bed  no unit programing . Emotional and mental status  improving, No safety concerns . Encourage  patient to get adequate amount of sleep and rest. Thought process  remains  altered . Problem: Education: Goal: Knowledge of [facility] Education information/materials will improve Outcome: Progressing Goal: Emotional status will improve Outcome: Progressing Goal: Mental status will improve Outcome: Progressing Goal: Verbalization of understanding the information provided will improve Outcome: Progressing   Problem: Safety: Goal: Periods of time without injury will increase Outcome: Progressing   Problem: Activity: Goal: Will verbalize the importance of balancing activity with adequate rest periods Outcome: Progressing   Problem: Education: Goal: Will be free of psychotic symptoms Outcome: Progressing   

## 2019-10-04 NOTE — Progress Notes (Signed)
[facility] MD Progress Note  10/04/2019 11:18 AM Cory Floyd  MRN:  [id number] Subjective: Patient is a 43 year old male with a past psychiatric history significant for either bipolar disorder or schizoaffective disorder who was brought to the [facility] emergency department on 09/29/2019 after being found by police agitated, very disorganized, and paranoid.  Objective: Patient is seen and examined.  Patient is a 43 year old male with the above-stated past psychiatric history who is seen in follow-up.  The patient is familiar to me from being admitted on 09/18/2019 secondary to psychotic symptoms as well.  It is noted from reviewing the chart that the patient is remained in bed most of the day since he has been in the hospital.  He does get up to eat, but otherwise has not been attending groups.  He had no complaints about his medications over the last several days.  He apparently reported feeling tired yesterday.  He took the trazodone last night, but stated he was unable to sleep.  This morning he stated he feels sluggish.  Apparently he was in a room in the emergency department with the patient who had been diagnosed with COVID-19.  Today he is on isolation secondary to that, and we need to repeat his coronavirus.  On his last hospitalization he was discharged on 09/23/2019.  He was discharged on Abilify, Depakote, gabapentin, naltrexone, propranolol and trazodone.  He is currently on Abilify, Depakote, gabapentin and mirtazapine.  His vital signs are stable, he is afebrile.  He slept 7 hours last night.  No evidence of auditory or visual hallucinations.  No evidence of suicidal or homicidal ideation.  Principal Problem: Bipolar affective disorder, mixed, severe, with psychotic behavior ([place]) Diagnosis: Principal Problem:   Bipolar affective disorder, mixed, severe, with psychotic behavior ([place]) Active Problems:   Back pain   Arthritis  Total Time spent with patient: 30  minutes  Past Psychiatric History: See admission H&P  Past Medical History:  Past Medical History:  Diagnosis Date  . 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 Anginal pain ([place])   . Arthritis   . Bipolar 1 disorder ([place])   . Depressed   . Dyspnea   . Fatty liver   . Schizophrenia [facility])     Past Surgical History:  Procedure Laterality Date  . COLONOSCOPY  02/05/2014   diverticulosis, hyperplastic polyp x 2  . fracture arm Right   . KNEE SURGERY Right 2013  . NASAL SEPTUM SURGERY    . NASAL TURBINATE REDUCTION Bilateral 12/12/2016   Procedure: TURBINATE REDUCTION/SUBMUCOSAL RESECTION;  Surgeon: Beverly Gust, MD;  Location: [facility] ORS;  Service: ENT;  Laterality: Bilateral;   Family History:  Family History  Problem Relation Age of Onset  . Hepatitis C Mother   . Cancer Mother   . Cancer Maternal Aunt   . Hypertension Maternal Grandmother    Family Psychiatric  History: See admission H&P Social History:  Social History   Substance and Sexual Activity  Alcohol Use Yes     Social History   Substance and Sexual Activity  Drug Use No   Comment: history of cocaine per Dr Ileene Hutchinson H&P    Social History   Socioeconomic History  . Marital status: Divorced    Spouse name: Not on file  . Number of children: Not on file  . Years of education: Not on file  . Highest education level: Not on file  Occupational History  . Not on file  Tobacco  Use  . Smoking status: Current Every Day Smoker    Packs/day: 1.00    Years: 20.00    Pack years: 20.00    Types: Cigarettes  . Smokeless tobacco: Never Used  Substance and Sexual Activity  . Alcohol use: Yes  . Drug use: No    Comment: history of cocaine per Dr Mikey Bussing H&P  . Sexual activity: Yes    Birth control/protection: None  Other Topics Concern  . Not on file  Social History Narrative  . Not on file   Social Determinants of Health   Financial Resource Strain:   . Difficulty of Paying Living Expenses: Not on  file  Food Insecurity:   . Worried About Programme researcher, broadcasting/film/video in the Last Year: Not on file  . Ran Out of Food in the Last Year: Not on file  Transportation Needs:   . Lack of Transportation (Medical): Not on file  . Lack of Transportation (Non-Medical): Not on file  Physical Activity:   . Days of Exercise per Week: Not on file  . Minutes of Exercise per Session: Not on file  Stress:   . Feeling of Stress : Not on file  Social Connections:   . Frequency of Communication with Friends and Family: Not on file  . Frequency of Social Gatherings with Friends and Family: Not on file  . Attends Religious Services: Not on file  . Active Member of Clubs or Organizations: Not on file  . Attends Banker Meetings: Not on file  . Marital Status: Not on file   Additional Social History:                         Sleep: Good  Appetite:  Good  Current Medications: Current Facility-Administered Medications  Medication Dose Route Frequency Provider Last Rate Last Admin  . acetaminophen (TYLENOL) tablet 650 mg  650 mg Oral Q6H PRN Clement Sayres, MD   650 mg at 09/30/19 2409  . alum & mag hydroxide-simeth (MAALOX/MYLANTA) 200-200-20 MG/5ML suspension 30 mL  30 mL Oral Q4H PRN Cristofano, Paul A, MD      . ARIPiprazole (ABILIFY) tablet 30 mg  30 mg Oral Daily Clapacs, Jackquline Denmark, MD   30 mg at 10/04/19 0743  . divalproex (DEPAKOTE ER) 24 hr tablet 1,500 mg  1,500 mg Oral Q supper Cristofano, Paul A, MD   1,500 mg at 10/03/19 1712  . gabapentin (NEURONTIN) capsule 300 mg  300 mg Oral TID Clement Sayres, MD   300 mg at 10/04/19 0743  . ibuprofen (ADVIL) tablet 800 mg  800 mg Oral Q8H PRN Money, Gerlene Burdock, FNP   800 mg at 10/03/19 2036  . magnesium hydroxide (MILK OF MAGNESIA) suspension 30 mL  30 mL Oral Daily PRN Cristofano, Paul A, MD      . mirtazapine (REMERON) tablet 15 mg  15 mg Oral QHS Money, Travis B, FNP   15 mg at 10/03/19 2130    Lab Results: No results found for  this or any previous visit (from the past 48 hour(s)).  Blood Alcohol level:  Lab Results  Component Value Date   ETH <10 09/16/2019   ETH <10 09/18/2018    Metabolic Disorder Labs: No results found for: HGBA1C, MPG No results found for: PROLACTIN No results found for: CHOL, TRIG, HDL, CHOLHDL, VLDL, LDLCALC  Physical Findings: AIMS:  , ,  ,  ,    CIWA:  COWS:     Musculoskeletal: Strength & Muscle Tone: within normal limits Gait & Station: normal Patient leans: N/A  Psychiatric Specialty Exam: Physical Exam  Nursing note and vitals reviewed. Constitutional: He is oriented to person, place, and time. He appears well-developed and well-nourished.  HENT:  Head: Normocephalic and atraumatic.  Respiratory: Effort normal.  Neurological: He is alert and oriented to person, place, and time.    Review of Systems  Blood pressure 118/86, pulse 76, temperature 97.9 F (36.6 C), temperature source Oral, resp. rate 18, height 5\' 9"  (1.753 m), weight 103.9 kg, SpO2 97 %.Body mass index is 33.83 kg/m.  General Appearance: Disheveled  Eye Contact:  Minimal  Speech:  Normal Rate  Volume:  Normal  Mood:  Euthymic  Affect:  Congruent  Thought Process:  Coherent and Descriptions of Associations: Circumstantial  Orientation:  Full (Time, Place, and Person)  Thought Content:  Logical  Suicidal Thoughts:  No  Homicidal Thoughts:  No  Memory:  Immediate;   Fair Recent;   Fair Remote;   Fair  Judgement:  Fair  Insight:  Fair  Psychomotor Activity:  Normal  Concentration:  Concentration: Fair and Attention Span: Fair  Recall:  FiservFair  Fund of Knowledge:  Fair  Language:  Good  Akathisia:  Negative  Handed:  Right  AIMS (if indicated):     Assets:  Desire for Improvement Resilience  ADL's:  Intact  Cognition:  WNL  Sleep:  Number of Hours: 7     Treatment Plan Summary: Daily contact with patient to assess and evaluate symptoms and progress in treatment, Medication  management and Plan : Patient is seen and examined.  Patient is a 43 year old male with the above-stated past psychiatric history who is seen in follow-up.  Diagnosis: #1 bipolar disorder versus schizoaffective disorder, #2 fatty liver  Patient is seen in follow-up.  He appears to be essentially the same as he has been.  His Depakote level on 12/1 was 61.  That has not been repeated.  We will do that tomorrow morning.  No changes in his current medication.  I will reorder his Covid test secondary to his exposure in the emergency room, and he continues on isolation in his room for now until we get that negative test. 1.  Continue Abilify 30 mg p.o. daily for mood stability and psychosis. 2.  Continue Depakote ER 1500 mg p.o. every afternoon for mood stability. 3.  Continue gabapentin 300 mg p.o. 3 times daily for mood stability. 4.  Continue mirtazapine 15 mg p.o. nightly for mood and sleep. 5.  Depakote level in a.m. tomorrow. 6.  Order repeat Covid 19 test. 7.  Disposition planning-in progress. Cory PertGreg Lawson Grove Defina, MD 10/04/2019, 11:18 AM

## 2019-10-05 LAB — CBC WITH DIFFERENTIAL/PLATELET
Abs Immature Granulocytes: 0.02 10*3/uL (ref 0.00–0.07)
Basophils Absolute: 0 10*3/uL (ref 0.0–0.1)
Basophils Relative: 1 %
Eosinophils Absolute: 0.1 10*3/uL (ref 0.0–0.5)
Eosinophils Relative: 1 %
HCT: 45.5 % (ref 39.0–52.0)
Hemoglobin: 15.2 g/dL (ref 13.0–17.0)
Immature Granulocytes: 0 %
Lymphocytes Relative: 34 %
Lymphs Abs: 2 10*3/uL (ref 0.7–4.0)
MCH: 30.8 pg (ref 26.0–34.0)
MCHC: 33.4 g/dL (ref 30.0–36.0)
MCV: 92.1 fL (ref 80.0–100.0)
Monocytes Absolute: 0.5 10*3/uL (ref 0.1–1.0)
Monocytes Relative: 8 %
Neutro Abs: 3.4 10*3/uL (ref 1.7–7.7)
Neutrophils Relative %: 56 %
Platelets: 187 10*3/uL (ref 150–400)
RBC: 4.94 MIL/uL (ref 4.22–5.81)
RDW: 12.7 % (ref 11.5–15.5)
WBC: 6.1 10*3/uL (ref 4.0–10.5)
nRBC: 0 % (ref 0.0–0.2)

## 2019-10-05 LAB — VALPROIC ACID LEVEL: Valproic Acid Lvl: 66 ug/mL (ref 50.0–100.0)

## 2019-10-05 LAB — HEPATIC FUNCTION PANEL
ALT: 28 U/L (ref 0–44)
AST: 26 U/L (ref 15–41)
Albumin: 3.6 g/dL (ref 3.5–5.0)
Alkaline Phosphatase: 50 U/L (ref 38–126)
Bilirubin, Direct: <0.1 mg/dL (ref 0.0–0.2)
Total Bilirubin: 0.8 mg/dL (ref 0.3–1.2)
Total Protein: 7.1 g/dL (ref 6.5–8.1)

## 2019-10-05 LAB — C-REACTIVE PROTEIN: CRP: 0.5 mg/dL (ref <1.0)

## 2019-10-05 LAB — FIBRIN DERIVATIVES D-DIMER (ARMC ONLY): Fibrin derivatives D-dimer (ARMC): 261.02 ng/mL (FEU) (ref 0.00–499.00)

## 2019-10-05 MED ORDER — DIPHENHYDRAMINE HCL 50 MG/ML IJ SOLN
25.0000 mg | Freq: Four times a day (QID) | INTRAMUSCULAR | Status: DC | PRN
  Administered 2019-10-07: 25 mg via INTRAMUSCULAR
  Filled 2019-10-05 (×2): qty 1

## 2019-10-05 MED ORDER — HALOPERIDOL LACTATE 5 MG/ML IJ SOLN
10.0000 mg | Freq: Four times a day (QID) | INTRAMUSCULAR | Status: DC | PRN
  Filled 2019-10-05: qty 2

## 2019-10-05 MED ORDER — LORAZEPAM 2 MG PO TABS
2.0000 mg | ORAL_TABLET | Freq: Four times a day (QID) | ORAL | Status: DC | PRN
  Administered 2019-10-05 – 2019-10-07 (×5): 2 mg via ORAL
  Filled 2019-10-05 (×5): qty 1

## 2019-10-05 MED ORDER — LORAZEPAM 2 MG/ML IJ SOLN
2.0000 mg | Freq: Four times a day (QID) | INTRAMUSCULAR | Status: DC | PRN
  Filled 2019-10-05 (×2): qty 1

## 2019-10-05 MED ORDER — ARIPIPRAZOLE 10 MG PO TABS
20.0000 mg | ORAL_TABLET | Freq: Every day | ORAL | Status: DC
  Administered 2019-10-06 – 2019-10-07 (×2): 20 mg via ORAL
  Filled 2019-10-05 (×2): qty 2

## 2019-10-05 NOTE — Progress Notes (Signed)
Brief Hx: 43 y.o. male, who was admitted to behavioral health on 12/7 after he was found by the police to be agitated, paranoid and with disorganized thought.  He has history of bipolar versus schizoaffective disorder.  Patient was hospitalized 2 weeks back for psychotic symptoms as well and discharged on 09/23/19/2020.  He was last tested for COVID-19 on 12/7 and was negative.  With concern for exposure he was tested again for COVID-19 and is tested positive. Hospitalist consulted for admission to medical floor.  Subjective: No acute issues medically at this time.  Objective: Vital signs in last 24 hours:    Intake/Output from previous day: No intake/output data recorded. Intake/Output this shift: No intake/output data recorded.  General: Middle-aged obese   HEENT: Pupils reactive bilaterally, EOMI, no pallor, no icterus, moist mucosa, supple neck, no cervical pain with Chest: Clear to auscultation bilateral CVs: Normal S1-S2, no murmurs rubs or gallop GI: Soft, nondistended, nontender, bowel sounds present Musculoskeletal: Warm, no edema CNS: Alert and oriented, nonfocal  Results for orders placed or performed during the hospital encounter of 09/30/19 (from the past 24 hour(s))  SARS Coronavirus 2 by RT PCR (hospital order, performed in [place] hospital lab)     Status: Abnormal   Collection Time: 10/04/19  2:30 PM  Result Value Ref Range   SARS Coronavirus 2 POSITIVE (A) NEGATIVE  CBC     Status: None   Collection Time: 10/04/19  5:11 PM  Result Value Ref Range   WBC 5.1 4.0 - 10.5 K/uL   RBC 4.99 4.22 - 5.81 MIL/uL   Hemoglobin 15.3 13.0 - 17.0 g/dL   HCT 46.7 39.0 - 52.0 %   MCV 93.6 80.0 - 100.0 fL   MCH 30.7 26.0 - 34.0 pg   MCHC 32.8 30.0 - 36.0 g/dL   RDW 12.6 11.5 - 15.5 %   Platelets 165 150 - 400 K/uL   nRBC 0.0 0.0 - 0.2 %  Comprehensive metabolic panel     Status: Abnormal   Collection Time: 10/04/19  5:11 PM  Result Value Ref Range   Sodium 137 135 - 145  mmol/L   Potassium 4.7 3.5 - 5.1 mmol/L   Chloride 99 98 - 111 mmol/L   CO2 28 22 - 32 mmol/L   Glucose, Bld 142 (H) 70 - 99 mg/dL   BUN 13 6 - 20 mg/dL   Creatinine, Ser 1.04 0.61 - 1.24 mg/dL   Calcium 9.0 8.9 - 10.3 mg/dL   Total Protein 7.6 6.5 - 8.1 g/dL   Albumin 4.0 3.5 - 5.0 g/dL   AST 22 15 - 41 U/L   ALT 29 0 - 44 U/L   Alkaline Phosphatase 55 38 - 126 U/L   Total Bilirubin 0.6 0.3 - 1.2 mg/dL   GFR calc non Af Amer >60 >60 mL/min   GFR calc Af Amer >60 >60 mL/min   Anion gap 10 5 - 15  Procalcitonin - Baseline     Status: None   Collection Time: 10/04/19  5:11 PM  Result Value Ref Range   Procalcitonin <0.10 ng/mL  Fibrin derivatives D-Dimer ([facility] only)     Status: None   Collection Time: 10/04/19  5:11 PM  Result Value Ref Range   Fibrin derivatives D-dimer ([facility]) 274.38 0.00 - 499.00 ng/mL (FEU)  Fibrinogen     Status: None   Collection Time: 10/04/19  5:11 PM  Result Value Ref Range   Fibrinogen 355 210 - 475 mg/dL  C-reactive  protein     Status: None   Collection Time: 10/04/19  5:11 PM  Result Value Ref Range   CRP 0.8 <1.0 mg/dL  Valproic acid level     Status: None   Collection Time: 10/05/19  9:16 AM  Result Value Ref Range   Valproic Acid Lvl 66 50.0 - 100.0 ug/mL  CBC with Differential/Platelet     Status: None   Collection Time: 10/05/19  9:16 AM  Result Value Ref Range   WBC 6.1 4.0 - 10.5 K/uL   RBC 4.94 4.22 - 5.81 MIL/uL   Hemoglobin 15.2 13.0 - 17.0 g/dL   HCT 16.1 09.6 - 04.5 %   MCV 92.1 80.0 - 100.0 fL   MCH 30.8 26.0 - 34.0 pg   MCHC 33.4 30.0 - 36.0 g/dL   RDW 40.9 81.1 - 91.4 %   Platelets 187 150 - 400 K/uL   nRBC 0.0 0.0 - 0.2 %   Neutrophils Relative % 56 %   Neutro Abs 3.4 1.7 - 7.7 K/uL   Lymphocytes Relative 34 %   Lymphs Abs 2.0 0.7 - 4.0 K/uL   Monocytes Relative 8 %   Monocytes Absolute 0.5 0.1 - 1.0 K/uL   Eosinophils Relative 1 %   Eosinophils Absolute 0.1 0.0 - 0.5 K/uL   Basophils Relative 1 %   Basophils  Absolute 0.0 0.0 - 0.1 K/uL   Immature Granulocytes 0 %   Abs Immature Granulocytes 0.02 0.00 - 0.07 K/uL  Hepatic function panel     Status: None   Collection Time: 10/05/19  9:16 AM  Result Value Ref Range   Total Protein 7.1 6.5 - 8.1 g/dL   Albumin 3.6 3.5 - 5.0 g/dL   AST 26 15 - 41 U/L   ALT 28 0 - 44 U/L   Alkaline Phosphatase 50 38 - 126 U/L   Total Bilirubin 0.8 0.3 - 1.2 mg/dL   Bilirubin, Direct <7.8 0.0 - 0.2 mg/dL   Indirect Bilirubin NOT CALCULATED 0.3 - 0.9 mg/dL    Studies/Results: DG Lumbar Spine 2-3 Views  Result Date: 09/29/2019 CLINICAL DATA:  Fall. Mid low back pain. Generalized right knee pain. EXAM: LUMBAR SPINE - 2-3 VIEW COMPARISON:  CT of the abdomen pelvis 10/18/2017 FINDINGS: Five non rib-bearing lumbar type vertebral bodies are present. Vertebral body heights are stable. Slight degenerative anterolisthesis is again noted at L5-S1. Facet disease is again noted at L4-5 and L5-S1. Atherosclerotic changes are present the aorta. IMPRESSION: 1. Stable appearance of degenerative changes in the lower lumbar spine. 2. No acute or focal abnormality. 3. Atherosclerosis. Electronically Signed   By: Marin Roberts M.D.   On: 09/29/2019 08:45   DG Knee 2 Views Right  Result Date: 09/29/2019 CLINICAL DATA:  Generalized right knee pain EXAM: RIGHT KNEE - 1-2 VIEW COMPARISON:  MRI of the right knee 06/28/2017 FINDINGS: Sequela of ACL repair. Tricompartmental osteoarthritis with marginal spurring and relative sparing at the patellofemoral compartment. No acute fracture or malalignment. Negative for joint effusion. IMPRESSION: 1. No acute finding. 2. Tricompartmental osteoarthritis. 3. Prior ACL repair. Electronically Signed   By: Marnee Spring M.D.   On: 09/29/2019 08:44   DG Chest Port 1 View  Result Date: 10/04/2019 CLINICAL DATA:  COVID 19 positive. Hx of cocaine abuse, current smoker. EXAM: PORTABLE CHEST 1 VIEW COMPARISON:  Chest radiograph 09/28/2019 FINDINGS: The  heart size and mediastinal contours are within normal limits. There are a few linear opacities at the left base likely scarring  or atelectasis. No new focal infiltrate. No pneumothorax or large pleural effusion. The visualized skeletal structures are unremarkable. IMPRESSION: No evidence of active disease in the chest. Electronically Signed   By: Emmaline KluverNancy  Ballantyne M.D.   On: 10/04/2019 18:31   DG Chest Port 1 View  Result Date: 09/29/2019 CLINICAL DATA:  Shortness of breath EXAM: PORTABLE CHEST 1 VIEW COMPARISON:  September 29, 2017 FINDINGS: The heart size and mediastinal contours are within normal limits. Both lungs are clear. The visualized skeletal structures are unremarkable. IMPRESSION: No active disease. Electronically Signed   By: Katherine Mantlehristopher  Green M.D.   On: 09/29/2019 00:08    Scheduled Meds: . [START ON 10/06/2019] ARIPiprazole  20 mg Oral Daily  . divalproex  1,500 mg Oral Q supper  . enoxaparin (LOVENOX) injection  40 mg Subcutaneous Q24H  . gabapentin  300 mg Oral TID  . mirtazapine  15 mg Oral QHS  . vitamin C  500 mg Oral Daily  . zinc sulfate  220 mg Oral Daily   Continuous Infusions: PRN Meds:acetaminophen, alum & mag hydroxide-simeth, diphenhydrAMINE, guaiFENesin-dextromethorphan, haloperidol lactate, LORazepam **OR** LORazepam, magnesium hydroxide  Assessment/Plan: COVID-19 virus infection:  -Continues to remain asymptomatic at this time - Inflammatory markers including CRP, D-dimer, fibrinogen, FDP and procalcitonin main negative since admission -  Check CBC and comprehensive metabolic panel, -Next x-ray from 10/04/2019 is negative for any acute cardiac pulmonary process  - patient maintaining sats on room air.   -Was not started on steroid, Actemra or remdesivir; need to hold on to those unless he has active respiratory symptoms or high inflammatory biomarkers.  Continue airborne and droplet precaution.  As needed antitussives.   - Added vitamin C and zinc.  Patient  tested positive for COVID-19 and does not appear to have active symptoms at this time.  However , patient is currently homeless and cannot be discharged anywhere or placed into a shelter given his active COVID-19 infection status.    Supposed to be transferred to medical floor.    Bipolar affective disorder, mixed, severe, with psychotic behavior (HCC) Admitted with acute psychosis.   -Consulted psychiatry  -Continue to follow psych recommendation; psych recs - on Depakote and Abilify which is continued.  Continue Neurontin.  Also on Remeron at bedtime.  He cannot get any group therapy due to COVID-19 status.  Acute kidney injury: Admission -Improved to baseline following admission Noted on admission with creatinine of 1.57.  Will monitor for now    Morbid obesity with BMI of 40.0-44.9, adult (HCC) -On diet and healthy lifestyle    Obstructive sleep apnea Not on CPAP   DVT Prophylaxis: Subcu Lovenox  Family Communication: Admission, patients condition and plan of care including tests being ordered have been discussed with the patient   Code Status full code  Likely DC to: Undetermined patient  Condition: Fair    LOS: 5 days   Shariece Viveiros Harold HedgeAlam

## 2019-10-05 NOTE — Progress Notes (Signed)
D: Patient is aware of COVID positive test result. Compliant with staying in his room and wearing a mask. Denies SI HI and AVH. Complaining of body aches. Medicated per prn order. AC states patient is unable to be transferred to [facility] due to staffing. Contact and isolation precautions maintained. A: Continue to monitor for safety R: Safety maintained.

## 2019-10-05 NOTE — Progress Notes (Signed)
[facility] MD Progress Note  10/05/2019 10:19 AM QUINTELL BONNIN  MRN:  [id number] Subjective:  Patient is a 43 year old male with a past psychiatric history significant for either bipolar disorder or schizoaffective disorder who was brought to the [facility] emergency department on 09/29/2019 after being found by police agitated, very disorganized, and paranoid.  Objective: Patient is seen and examined.  Patient is a 43 year old male with the above-stated past psychiatric history who is seen in follow-up.  Unfortunately his coronavirus test came back positive, but otherwise he is essentially unchanged.  He is remained in bed a great deal of time is been on the unit.  He stated today when asked how he felt he said "useless".  No changes medications were done yesterday.  His vital signs are stable, he is afebrile.  He slept 7 hours last night.  Laboratories from this a.m. showed normal liver function enzymes, normal CBC and differential.  His fibrinogen split products came back normal.  His D-dimer also came back negative.  His Depakote level this morning was 66.  He denied suicidal ideation this morning.  Principal Problem: Bipolar affective disorder, mixed, severe, with psychotic behavior ([place]) Diagnosis: Principal Problem:   Bipolar affective disorder, mixed, severe, with psychotic behavior ([place]) Active Problems:   Morbid obesity with BMI of 40.0-44.9, adult (HCC)   Obstructive sleep apnea   Psychotic disorder ([place])   Bipolar 1 disorder (HCC)   Back pain   Arthritis   COVID-19 virus infection  Total Time spent with patient: 20 minutes  Past Psychiatric History: See admission H&P  Past Medical History:  Past Medical History:  Diagnosis Date  . 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 Anginal pain ([place])   . Arthritis   . Bipolar 1 disorder ([place])   . Depressed   . Dyspnea   . Fatty liver   . Schizophrenia [facility])     Past Surgical History:  Procedure Laterality Date  .  COLONOSCOPY  02/05/2014   diverticulosis, hyperplastic polyp x 2  . fracture arm Right   . KNEE SURGERY Right 2013  . NASAL SEPTUM SURGERY    . NASAL TURBINATE REDUCTION Bilateral 12/12/2016   Procedure: TURBINATE REDUCTION/SUBMUCOSAL RESECTION;  Surgeon: Beverly Gust, MD;  Location: [facility] ORS;  Service: ENT;  Laterality: Bilateral;   Family History:  Family History  Problem Relation Age of Onset  . Hepatitis C Mother   . Cancer Mother   . Cancer Maternal Aunt   . Hypertension Maternal Grandmother    Family Psychiatric  History: See admission H&P Social History:  Social History   Substance and Sexual Activity  Alcohol Use Yes     Social History   Substance and Sexual Activity  Drug Use No   Comment: history of cocaine per Dr Ileene Hutchinson H&P    Social History   Socioeconomic History  . Marital status: Divorced    Spouse name: Not on file  . Number of children: Not on file  . Years of education: Not on file  . Highest education level: Not on file  Occupational History  . Not on file  Tobacco Use  . Smoking status: Current Every Day Smoker    Packs/day: 1.00    Years: 20.00    Pack years: 20.00    Types: Cigarettes  . Smokeless tobacco: Never Used  Substance and Sexual Activity  . Alcohol use: Yes  . Drug use: No    Comment: history of cocaine per  Dr Mikey BussingMcQueen's H&P  . Sexual activity: Yes    Birth control/protection: None  Other Topics Concern  . Not on file  Social History Narrative  . Not on file   Social Determinants of Health   Financial Resource Strain:   . Difficulty of Paying Living Expenses: Not on file  Food Insecurity:   . Worried About Programme researcher, broadcasting/film/videounning Out of Food in the Last Year: Not on file  . Ran Out of Food in the Last Year: Not on file  Transportation Needs:   . Lack of Transportation (Medical): Not on file  . Lack of Transportation (Non-Medical): Not on file  Physical Activity:   . Days of Exercise per Week: Not on file  . Minutes of Exercise  per Session: Not on file  Stress:   . Feeling of Stress : Not on file  Social Connections:   . Frequency of Communication with Friends and Family: Not on file  . Frequency of Social Gatherings with Friends and Family: Not on file  . Attends Religious Services: Not on file  . Active Member of Clubs or Organizations: Not on file  . Attends BankerClub or Organization Meetings: Not on file  . Marital Status: Not on file   Additional Social History:                         Sleep: Fair  Appetite:  Fair  Current Medications: Current Facility-Administered Medications  Medication Dose Route Frequency Provider Last Rate Last Admin  . acetaminophen (TYLENOL) tablet 650 mg  650 mg Oral Q6H PRN Cristofano, Worthy RancherPaul A, MD   650 mg at 10/04/19 2352  . acetaminophen (TYLENOL) tablet 650 mg  650 mg Oral Q6H PRN Dhungel, Nishant, MD      . alum & mag hydroxide-simeth (MAALOX/MYLANTA) 200-200-20 MG/5ML suspension 30 mL  30 mL Oral Q4H PRN Cristofano, Paul A, MD      . ARIPiprazole (ABILIFY) tablet 30 mg  30 mg Oral Daily Clapacs, Jackquline DenmarkJohn T, MD   30 mg at 10/04/19 0743  . divalproex (DEPAKOTE ER) 24 hr tablet 1,500 mg  1,500 mg Oral Q supper Cristofano, Worthy RancherPaul A, MD   1,500 mg at 10/04/19 1733  . enoxaparin (LOVENOX) injection 40 mg  40 mg Subcutaneous Q24H Dhungel, Nishant, MD   40 mg at 10/04/19 2131  . gabapentin (NEURONTIN) capsule 300 mg  300 mg Oral TID Clement Sayresristofano, Paul A, MD   300 mg at 10/04/19 1732  . guaiFENesin-dextromethorphan (ROBITUSSIN DM) 100-10 MG/5ML syrup 10 mL  10 mL Oral Q4H PRN Dhungel, Nishant, MD      . ibuprofen (ADVIL) tablet 800 mg  800 mg Oral Q8H PRN Money, Gerlene Burdockravis B, FNP   800 mg at 10/03/19 2036  . magnesium hydroxide (MILK OF MAGNESIA) suspension 30 mL  30 mL Oral Daily PRN Cristofano, Paul A, MD      . mirtazapine (REMERON) tablet 15 mg  15 mg Oral QHS Money, Gerlene Burdockravis B, FNP   15 mg at 10/04/19 2131  . vitamin C (ASCORBIC ACID) tablet 500 mg  500 mg Oral Daily Dhungel, Nishant,  MD      . zinc sulfate capsule 220 mg  220 mg Oral Daily Dhungel, Nishant, MD   220 mg at 10/04/19 2131    Lab Results:  Results for orders placed or performed during the hospital encounter of 09/30/19 (from the past 48 hour(s))  SARS Coronavirus 2 by RT PCR (hospital order, [facility])     Status: Abnormal   Collection Time: 10/04/19  2:30 PM  Result Value Ref Range   SARS Coronavirus 2 POSITIVE (A) NEGATIVE    Comment: CRITICAL RESULT CALLED TO, READ BACK BY AND VERIFIED WITH: SLADE REYNOLDS AT 1547 ON 10/04/2019 [facility]. (NOTE) SARS-CoV-2 target nucleic acids are DETECTED SARS-CoV-2 RNA is generally detectable in upper respiratory specimens  during the acute phase of infection.  Positive results are indicative  of the presence of the identified virus, but do not rule out bacterial infection or co-infection with other pathogens not detected by the test.  Clinical correlation with patient history and  other diagnostic information is necessary to determine patient infection status.  The expected result is negative. Fact Sheet for Patients:   BoilerBrush.com.cy  Fact Sheet for Healthcare Providers:   https://pope.com/   This test is not yet approved or cleared by the [place] FDA and  has been authorized for detection and/or diagnosis of SARS-CoV-2 by FDA under an Emergency Use Authorization (EUA).  This EUA will remain in effect (meaning  this test can be used) for the duration of  the COVID-19 declaration under Section 564(b)(1) of the Act, 21 U.S.C. section 360-bbb-3(b)(1), unless the authorization is terminated or revoked sooner. Performed at [facility], [address] [place], [place] [address]   CBC     Status: None   Collection Time: 10/04/19  5:11 PM  Result Value Ref Range   WBC 5.1 4.0 - 10.5 K/uL   RBC 4.99 4.22 - 5.81 MIL/uL   Hemoglobin 15.3 13.0 - 17.0 g/dL   HCT 60.4 54.0 -  98.1 %   MCV 93.6 80.0 - 100.0 fL   MCH 30.7 26.0 - 34.0 pg   MCHC 32.8 30.0 - 36.0 g/dL   RDW 19.1 47.8 - 29.5 %   Platelets 165 150 - 400 K/uL   nRBC 0.0 0.0 - 0.2 %    Comment: Performed at [facility], [address] [place], [place] [address]  Comprehensive metabolic panel     Status: Abnormal   Collection Time: 10/04/19  5:11 PM  Result Value Ref Range   Sodium 137 135 - 145 mmol/L   Potassium 4.7 3.5 - 5.1 mmol/L   Chloride 99 98 - 111 mmol/L   CO2 28 22 - 32 mmol/L   Glucose, Bld 142 (H) 70 - 99 mg/dL   BUN 13 6 - 20 mg/dL   Creatinine, Ser 8.65 0.61 - 1.24 mg/dL   Calcium 9.0 8.9 - 78.4 mg/dL   Total Protein 7.6 6.5 - 8.1 g/dL   Albumin 4.0 3.5 - 5.0 g/dL   AST 22 15 - 41 U/L   ALT 29 0 - 44 U/L   Alkaline Phosphatase 55 38 - 126 U/L   Total Bilirubin 0.6 0.3 - 1.2 mg/dL   GFR calc non Af Amer >60 >60 mL/min   GFR calc Af Amer >60 >60 mL/min   Anion gap 10 5 - 15    Comment: Performed at [facility], [address] [place], [place] [address]  Procalcitonin - Baseline     Status: None   Collection Time: 10/04/19  5:11 PM  Result Value Ref Range   Procalcitonin <0.10 ng/mL    Comment:        Interpretation: PCT (Procalcitonin) <= 0.5 ng/mL: Systemic infection (sepsis) is not likely. Local bacterial infection is possible. (NOTE)       Sepsis PCT Algorithm  Lower Respiratory Tract                                      Infection PCT Algorithm    ----------------------------     ----------------------------         PCT < 0.25 ng/mL                PCT < 0.10 ng/mL         Strongly encourage             Strongly discourage   discontinuation of antibiotics    initiation of antibiotics    ----------------------------     -----------------------------       PCT 0.25 - 0.50 ng/mL            PCT 0.10 - 0.25 ng/mL               OR       >80% decrease in PCT            Discourage initiation of                                             antibiotics      Encourage discontinuation           of antibiotics    ----------------------------     -----------------------------         PCT >= 0.50 ng/mL              PCT 0.26 - 0.50 ng/mL               AND        <80% decrease in PCT             Encourage initiation of                                             antibiotics       Encourage continuation           of antibiotics    ----------------------------     -----------------------------        PCT >= 0.50 ng/mL                  PCT > 0.50 ng/mL               AND         increase in PCT                  Strongly encourage                                      initiation of antibiotics    Strongly encourage escalation           of antibiotics                                     -----------------------------  PCT <= 0.25 ng/mL                                                 OR                                        > 80% decrease in PCT                                     Discontinue / Do not initiate                                             antibiotics Performed at [facility], [address] [place], [place] [address]   Fibrin derivatives D-D[facility] only)     Status: None   Collection Time: 10/04/19  5:11 PM  Result Value Ref Range   Fibrin derivatives D-dimer ([facility]) 274.38 0.00 - 499.00 ng/mL (FEU)    Comment: (NOTE) <> Exclusion of Venous Thromboembolism (VTE) - OUTPATIENT ONLY   (Emergency Department or [place])   0-499 ng/ml (FEU): With a low to intermediate pretest probability                      for VTE this test result excludes the diagnosis                      of VTE.   >499 ng/ml (FEU) : VTE not excluded; additional work up for VTE is                      required. <> Testing on Inpatients and Evaluation of Disseminated Intravascular   Coagulation (DIC) Reference Range:   0-499 ng/ml (FEU) Performed at [facility], [address]  [place], [place] [address]   Fibrinogen     Status: None   Collection Time: 10/04/19  5:11 PM  Result Value Ref Range   Fibrinogen 355 210 - 475 mg/dL    Comment: Performed at [facility], [address] [place], [place] [address]  C-reactive protein     Status: None   Collection Time: 10/04/19  5:11 PM  Result Value Ref Range   CRP 0.8 <1.0 mg/dL    Comment: Performed at [facility], [address], [place], [place] [address]  Valproic acid level     Status: None   Collection Time: 10/05/19  9:16 AM  Result Value Ref Range   Valproic Acid Lvl 66 50.0 - 100.0 ug/mL    Comment: Performed at [facility], [address] [place], [place] [address]  CBC with Differential/Platelet     Status: None   Collection Time: 10/05/19  9:16 AM  Result Value Ref Range   WBC 6.1 4.0 - 10.5 K/uL   RBC 4.94 4.22 - 5.81 MIL/uL   Hemoglobin 15.2 13.0 - 17.0 g/dL   HCT 13.0 86.5 - 78.4 %   MCV 92.1 80.0 - 100.0 fL   MCH 30.8 26.0 - 34.0 pg  MCHC 33.4 30.0 - 36.0 g/dL   RDW 88.5 02.7 - 74.1 %   Platelets 187 150 - 400 K/uL   nRBC 0.0 0.0 - 0.2 %   Neutrophils Relative % 56 %   Neutro Abs 3.4 1.7 - 7.7 K/uL   Lymphocytes Relative 34 %   Lymphs Abs 2.0 0.7 - 4.0 K/uL   Monocytes Relative 8 %   Monocytes Absolute 0.5 0.1 - 1.0 K/uL   Eosinophils Relative 1 %   Eosinophils Absolute 0.1 0.0 - 0.5 K/uL   Basophils Relative 1 %   Basophils Absolute 0.0 0.0 - 0.1 K/uL   Immature Granulocytes 0 %   Abs Immature Granulocytes 0.02 0.00 - 0.07 K/uL    Comment: Performed at [facility], [address] [place], [place] [address]  Hepatic function panel     Status: None   Collection Time: 10/05/19  9:16 AM  Result Value Ref Range   Total Protein 7.1 6.5 - 8.1 g/dL   Albumin 3.6 3.5 - 5.0 g/dL   AST 26 15 - 41 U/L   ALT 28 0 - 44 U/L   Alkaline Phosphatase 50 38 - 126 U/L   Total Bilirubin 0.8 0.3 - 1.2 mg/dL   Bilirubin, Direct <7.6 0.0 - 0.2 mg/dL   Indirect  Bilirubin NOT CALCULATED 0.3 - 0.9 mg/dL    Comment: Performed at [facility], [address] [place], [place] [address]    Blood Alcohol level:  Lab Results  Component Value Date   [facility] <10 09/16/2019   ETH <10 09/18/2018    Metabolic Disorder Labs: No results found for: HGBA1C, MPG No results found for: PROLACTIN No results found for: CHOL, TRIG, HDL, CHOLHDL, VLDL, LDLCALC  Physical Findings: AIMS:  , ,  ,  ,    CIWA:    COWS:     Musculoskeletal: Strength & Muscle Tone: within normal limits Gait & Station: normal Patient leans: N/A  Psychiatric Specialty Exam: Physical Exam  Nursing note and vitals reviewed. Constitutional: He is oriented to person, place, and time. He appears well-developed and well-nourished.  HENT:  Head: Normocephalic and atraumatic.  Respiratory: Effort normal.  Neurological: He is alert and oriented to person, place, and time.    Review of Systems  Blood pressure 118/86, pulse 76, temperature 97.9 F (36.6 C), temperature source Oral, resp. rate 18, height 5\' 9"  (1.753 m), weight 103.9 kg, SpO2 97 %.Body mass index is 33.83 kg/m.  General Appearance: Disheveled  Eye Contact:  Minimal  Speech:  Normal Rate  Volume:  Decreased  Mood:  Dysphoric  Affect:  Congruent  Thought Process:  Coherent and Descriptions of Associations: Circumstantial  Orientation:  Full (Time, Place, and Person)  Thought Content:  Rumination  Suicidal Thoughts:  No  Homicidal Thoughts:  No  Memory:  Immediate;   Fair Recent;   Fair Remote;   Fair  Judgement:  Impaired  Insight:  Lacking  Psychomotor Activity:  Decreased  Concentration:  Concentration: Fair and Attention Span: Fair  Recall:  of Knowledge:  Fair  Language:  Fair  Akathisia:  Negative  Handed:  Right  AIMS (if indicated):     Assets:  Desire for Improvement Resilience  ADL's:  Impaired  Cognition:  WNL  Sleep:  Number of Hours: 5.5     Treatment Plan  Summary: Daily contact with patient to assess and evaluate symptoms and progress in treatment, Medication management and Plan : Patient is seen  and examined.  Patient is a 43 year old male with the above-stated past psychiatric history who is seen in follow-up.   Diagnosis: #1 bipolar disorder versus schizoaffective disorder; bipolar type, #2 COVID-19 positive, #3 fatty liver  Patient is seen in follow-up.  He seems to continue to be lethargic.  I am going to reduce his medication burden.  I am going to decrease his Abilify to 20 mg p.o. daily, but given his Depakote level at 66 no change in his Depakote dosage.  No change in the gabapentin or mirtazapine at this point..  We will continue mirtazapine and other medications as is.  Overall I believe the patient is still going to be transferred to the medicine service, but we will have to monitor that. 1.  Decrease Abilify to 20 mg p.o. daily for psychosis. 2.  Continue Depakote ER 1500 mg p.o. every afternoon for mood stability. 3.  Continue Lovenox 40 mg subcu every 24 hours for DVT prophylaxis. 4.  Continue gabapentin 300 mg p.o. 3 times daily for mood stability. 5.  Continue ibuprofen 800 mg p.o. every 8 hours as needed pain. 6.  Continue mirtazapine 15 mg p.o. nightly for mood, anxiety and sleep. 7.  Continue vitamin C and zinc supplementation secondary to positive coronavirus test. 8.  Disposition planning-in progress.  Antonieta Pert, MD 10/05/2019, 10:19 AM

## 2019-10-05 NOTE — Plan of Care (Signed)
  Problem: Education: Goal: Knowledge of [facility] Education information/materials will improve Outcome: Not Progressing Goal: Emotional status will improve Outcome: Not Progressing Goal: Mental status will improve Outcome: Not Progressing Goal: Verbalization of understanding the information provided will improve Outcome: Not Progressing  D: Patient is aware of COVID positive test result. Compliant with staying in his room and wearing a mask. Denies SI HI and AVH. Complaining of body aches. Medicated per prn order. AC states patient is unable to be transferred to [facility] due to staffing. Contact and isolation precautions maintained. A: Continue to monitor for safety R: Safety maintained.

## 2019-10-06 LAB — BASIC METABOLIC PANEL
Anion gap: 11 (ref 5–15)
BUN: 13 mg/dL (ref 6–20)
CO2: 24 mmol/L (ref 22–32)
Calcium: 9.1 mg/dL (ref 8.9–10.3)
Chloride: 102 mmol/L (ref 98–111)
Creatinine, Ser: 0.73 mg/dL (ref 0.61–1.24)
GFR calc Af Amer: >60 mL/min (ref >60)
GFR calc non Af Amer: >60 mL/min (ref >60)
Glucose, Bld: 110 mg/dL — ABNORMAL HIGH (ref 70–99)
Potassium: 3.9 mmol/L (ref 3.5–5.1)
Sodium: 137 mmol/L (ref 135–145)

## 2019-10-06 LAB — FIBRIN DERIVATIVES D-DIMER (ARMC ONLY): Fibrin derivatives D-dimer (ARMC): 216.65 ng/mL (FEU) (ref 0.00–499.00)

## 2019-10-06 LAB — HIV ANTIBODY (ROUTINE TESTING W REFLEX): HIV Screen 4th Generation wRfx: NONREACTIVE

## 2019-10-06 LAB — C-REACTIVE PROTEIN: CRP: 0.8 mg/dL (ref <1.0)

## 2019-10-06 NOTE — Progress Notes (Signed)
Recreation Therapy Notes  Date: 10/06/2019  Time: 9:30 am    Patient offered activity book and accepted.    Rick Carruthers LRT/CTRS        Janelle Spellman 10/06/2019 11:35 AM

## 2019-10-06 NOTE — Progress Notes (Signed)
[facility] MD Progress Note  10/06/2019 3:06 PM Cory Floyd  MRN:  [id number] Subjective: Patient seen chart reviewed.  Patient with bipolar disorder presented with depression and hallucinations.  Patient now has been laboratory diagnosed positive with COVID-19.  Seen today the patient is staying in bed pretty much all the time.  He is easy to arouse but does not have much to say.  Describes himself as feeling crummy.  Cannot give any more specific details than that.  Claims to be coughing but did not cough during the time I was staying with him.  Continues to endorse auditory hallucinations hopelessness and suicidal thoughts.  He has been compliant with medicines but at this point we have had to cancel group experiences making it hard to assess any other level of cooperation.  Patient appears to be dysphoric worn out and unmotivated. Principal Problem: Bipolar affective disorder, mixed, severe, with psychotic behavior (HCC) Diagnosis: Principal Problem:   Bipolar affective disorder, mixed, severe, with psychotic behavior (HCC) Active Problems:   Morbid obesity with BMI of 40.0-44.9, adult (HCC)   Obstructive sleep apnea   Psychotic disorder (HCC)   Bipolar 1 disorder (HCC)   Back pain   Arthritis   COVID-19 virus infection  Total Time spent with patient: 30 minutes  Past Psychiatric History: Past history of recurrent depression bipolar disorder past history of alcohol abuse  Past Medical History:  Past Medical History:  Diagnosis Date  . 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 Anginal pain (HCC)   . Arthritis   . Bipolar 1 disorder (HCC)   . Depressed   . Dyspnea   . Fatty liver   . Schizophrenia [facility])     Past Surgical History:  Procedure Laterality Date  . COLONOSCOPY  02/05/2014   diverticulosis, hyperplastic polyp x 2  . fracture arm Right   . KNEE SURGERY Right 2013  . NASAL SEPTUM SURGERY    . NASAL TURBINATE REDUCTION Bilateral 12/12/2016   Procedure: TURBINATE  REDUCTION/SUBMUCOSAL RESECTION;  Surgeon: Linus Salmons, MD;  Location: [facility] ORS;  Service: ENT;  Laterality: Bilateral;   Family History:  Family History  Problem Relation Age of Onset  . Hepatitis C Mother   . Cancer Mother   . Cancer Maternal Aunt   . Hypertension Maternal Grandmother    Family Psychiatric  History: See previous Social History:  Social History   Substance and Sexual Activity  Alcohol Use Yes     Social History   Substance and Sexual Activity  Drug Use No   Comment: history of cocaine per Dr Mikey Bussing H&P    Social History   Socioeconomic History  . Marital status: Divorced    Spouse name: Not on file  . Number of children: Not on file  . Years of education: Not on file  . Highest education level: Not on file  Occupational History  . Not on file  Tobacco Use  . Smoking status: Current Every Day Smoker    Packs/day: 1.00    Years: 20.00    Pack years: 20.00    Types: Cigarettes  . Smokeless tobacco: Never Used  Substance and Sexual Activity  . Alcohol use: Yes  . Drug use: No    Comment: history of cocaine per Dr Mikey Bussing H&P  . Sexual activity: Yes    Birth control/protection: None  Other Topics Concern  . Not on file  Social History Narrative  . Not on file   Social Determinants of  Health   Financial Resource Strain:   . Difficulty of Paying Living Expenses: Not on file  Food Insecurity:   . Worried About Programme researcher, broadcasting/film/video in the Last Year: Not on file  . Ran Out of Food in the Last Year: Not on file  Transportation Needs:   . Lack of Transportation (Medical): Not on file  . Lack of Transportation (Non-Medical): Not on file  Physical Activity:   . Days of Exercise per Week: Not on file  . Minutes of Exercise per Session: Not on file  Stress:   . Feeling of Stress : Not on file  Social Connections:   . Frequency of Communication with Friends and Family: Not on file  . Frequency of Social Gatherings with Friends and Family:  Not on file  . Attends Religious Services: Not on file  . Active Member of Clubs or Organizations: Not on file  . Attends Banker Meetings: Not on file  . Marital Status: Not on file   Additional Social History:                         Sleep: Fair  Appetite:  Negative  Current Medications: Current Facility-Administered Medications  Medication Dose Route Frequency Provider Last Rate Last Admin  . acetaminophen (TYLENOL) tablet 650 mg  650 mg Oral Q6H PRN Dhungel, Nishant, MD   650 mg at 10/06/19 1128  . alum & mag hydroxide-simeth (MAALOX/MYLANTA) 200-200-20 MG/5ML suspension 30 mL  30 mL Oral Q4H PRN Cristofano, Paul A, MD      . ARIPiprazole (ABILIFY) tablet 20 mg  20 mg Oral Daily Antonieta Pert, MD   20 mg at 10/06/19 0911  . diphenhydrAMINE (BENADRYL) injection 25 mg  25 mg Intramuscular Q6H PRN Antonieta Pert, MD      . divalproex (DEPAKOTE ER) 24 hr tablet 1,500 mg  1,500 mg Oral Q supper Cristofano, Worthy Rancher, MD   1,500 mg at 10/05/19 1821  . enoxaparin (LOVENOX) injection 40 mg  40 mg Subcutaneous Q24H Dhungel, Nishant, MD   40 mg at 10/05/19 2117  . gabapentin (NEURONTIN) capsule 300 mg  300 mg Oral TID Clement Sayres, MD   300 mg at 10/06/19 1227  . guaiFENesin-dextromethorphan (ROBITUSSIN DM) 100-10 MG/5ML syrup 10 mL  10 mL Oral Q4H PRN Dhungel, Nishant, MD      . haloperidol lactate (HALDOL) injection 10 mg  10 mg Intramuscular Q6H PRN Antonieta Pert, MD      . LORazepam (ATIVAN) tablet 2 mg  2 mg Oral Q6H PRN Antonieta Pert, MD   2 mg at 10/06/19 0241   Or  . LORazepam (ATIVAN) injection 2 mg  2 mg Intramuscular Q6H PRN Antonieta Pert, MD      . magnesium hydroxide (MILK OF MAGNESIA) suspension 30 mL  30 mL Oral Daily PRN Cristofano, Worthy Rancher, MD      . mirtazapine (REMERON) tablet 15 mg  15 mg Oral QHS Money, Gerlene Burdock, FNP   15 mg at 10/05/19 2117  . vitamin C (ASCORBIC ACID) tablet 500 mg  500 mg Oral Daily Dhungel, Nishant, MD    500 mg at 10/06/19 0911  . zinc sulfate capsule 220 mg  220 mg Oral Daily Dhungel, Nishant, MD   220 mg at 10/06/19 1610    Lab Results:  Results for orders placed or performed during the hospital encounter of 09/30/19 (from the past 48 hour(s))  CBC     Status: None   Collection Time: 10/04/19  5:11 PM  Result Value Ref Range   WBC 5.1 4.0 - 10.5 K/uL   RBC 4.99 4.22 - 5.81 MIL/uL   Hemoglobin 15.3 13.0 - 17.0 g/dL   HCT 69.6 29.5 - 28.4 %   MCV 93.6 80.0 - 100.0 fL   MCH 30.7 26.0 - 34.0 pg   MCHC 32.8 30.0 - 36.0 g/dL   RDW 13.2 44.0 - 10.2 %   Platelets 165 150 - 400 K/uL   nRBC 0.0 0.0 - 0.2 %    Comment: Performed at [facility], [address] [place], [place] [address]  Comprehensive metabolic panel     Status: Abnormal   Collection Time: 10/04/19  5:11 PM  Result Value Ref Range   Sodium 137 135 - 145 mmol/L   Potassium 4.7 3.5 - 5.1 mmol/L   Chloride 99 98 - 111 mmol/L   CO2 28 22 - 32 mmol/L   Glucose, Bld 142 (H) 70 - 99 mg/dL   BUN 13 6 - 20 mg/dL   Creatinine, Ser 6.44 0.61 - 1.24 mg/dL   Calcium 9.0 8.9 - 03.4 mg/dL   Total Protein 7.6 6.5 - 8.1 g/dL   Albumin 4.0 3.5 - 5.0 g/dL   AST 22 15 - 41 U/L   ALT 29 0 - 44 U/L   Alkaline Phosphatase 55 38 - 126 U/L   Total Bilirubin 0.6 0.3 - 1.2 mg/dL   GFR calc non Af Amer >60 >60 mL/min   GFR calc Af Amer >60 >60 mL/min   Anion gap 10 5 - 15    Comment: Performed at [facility], [address] [place], [place] [address]  Procalcitonin - Baseline     Status: None   Collection Time: 10/04/19  5:11 PM  Result Value Ref Range   Procalcitonin <0.10 ng/mL    Comment:        Interpretation: PCT (Procalcitonin) <= 0.5 ng/mL: Systemic infection (sepsis) is not likely. Local bacterial infection is possible. (NOTE)       Sepsis PCT Algorithm           Lower Respiratory Tract                                      Infection PCT Algorithm    ----------------------------      ----------------------------         PCT < 0.25 ng/mL                PCT < 0.10 ng/mL         Strongly encourage             Strongly discourage   discontinuation of antibiotics    initiation of antibiotics    ----------------------------     -----------------------------       PCT 0.25 - 0.50 ng/mL            PCT 0.10 - 0.25 ng/mL               OR       >80% decrease in PCT            Discourage initiation of  antibiotics      Encourage discontinuation           of antibiotics    ----------------------------     -----------------------------         PCT >= 0.50 ng/mL              PCT 0.26 - 0.50 ng/mL               AND        <80% decrease in PCT             Encourage initiation of                                             antibiotics       Encourage continuation           of antibiotics    ----------------------------     -----------------------------        PCT >= 0.50 ng/mL                  PCT > 0.50 ng/mL               AND         increase in PCT                  Strongly encourage                                      initiation of antibiotics    Strongly encourage escalation           of antibiotics                                     -----------------------------                                           PCT <= 0.25 ng/mL                                                 OR                                        > 80% decrease in PCT                                     Discontinue / Do not initiate                                             antibiotics Performed at [facility], [address] [place], [place] [address]   Fibrin derivatives D-Dimer [facility] only)     Status: None   Collection Time:  10/04/19  5:11 PM  Result Value Ref Range   Fibrin derivatives D-dimer ([facility]) 274.38 0.00 - 499.00 ng/mL (FEU)    Comment: (NOTE) <> Exclusion of Venous Thromboembolism (VTE) - OUTPATIENT ONLY   (Emergency Department or  [place])   0-499 ng/ml (FEU): With a low to intermediate pretest probability                      for VTE this test result excludes the diagnosis                      of VTE.   >499 ng/ml (FEU) : VTE not excluded; additional work up for VTE is                      required. <> Testing on Inpatients and Evaluation of Disseminated Intravascular   Coagulation (DIC) Reference Range:   0-499 ng/ml (FEU) Performed at [facility], [address] [place], [place] [address]   Fibrinogen     Status: None   Collection Time: 10/04/19  5:11 PM  Result Value Ref Range   Fibrinogen 355 210 - 475 mg/dL    Comment: Performed at [facility], [address] [place], [place] [address]  C-reactive protein     Status: None   Collection Time: 10/04/19  5:11 PM  Result Value Ref Range   CRP 0.8 <1.0 mg/dL    Comment: Performed at [facility], [address], [place], [place] [address]  Valproic acid level     Status: None   Collection Time: 10/05/19  9:16 AM  Result Value Ref Range   Valproic Acid Lvl 66 50.0 - 100.0 ug/mL    Comment: Performed at [facility], [address] [place], [place] [address]  CBC with Differential/Platelet     Status: None   Collection Time: 10/05/19  9:16 AM  Result Value Ref Range   WBC 6.1 4.0 - 10.5 K/uL   RBC 4.94 4.22 - 5.81 MIL/uL   Hemoglobin 15.2 13.0 - 17.0 g/dL   HCT 55.7 32.2 - 02.5 %   MCV 92.1 80.0 - 100.0 fL   MCH 30.8 26.0 - 34.0 pg   MCHC 33.4 30.0 - 36.0 g/dL   RDW 42.7 06.2 - 37.6 %   Platelets 187 150 - 400 K/uL   nRBC 0.0 0.0 - 0.2 %   Neutrophils Relative % 56 %   Neutro Abs 3.4 1.7 - 7.7 K/uL   Lymphocytes Relative 34 %   Lymphs Abs 2.0 0.7 - 4.0 K/uL   Monocytes Relative 8 %   Monocytes Absolute 0.5 0.1 - 1.0 K/uL   Eosinophils Relative 1 %   Eosinophils Absolute 0.1 0.0 - 0.5 K/uL   Basophils Relative 1 %   Basophils Absolute 0.0 0.0 - 0.1 K/uL   Immature Granulocytes 0 %   Abs Immature  Granulocytes 0.02 0.00 - 0.07 K/uL    Comment: Performed at [facility], [address] [place], [place] [address]  Hepatic function panel     Status: None   Collection Time: 10/05/19  9:16 AM  Result Value Ref Range   Total Protein 7.1 6.5 - 8.1 g/dL   Albumin 3.6 3.5 - 5.0 g/dL   AST 26 15 - 41 U/L   ALT 28 0 - 44 U/L   Alkaline Phosphatase 50 38 - 126 U/L   Total Bilirubin 0.8 0.3 - 1.2  mg/dL   Bilirubin, Direct <0.1 0.0 - 0.2 mg/dL   Indirect Bilirubin NOT CALCULATED 0.3 - 0.9 mg/dL    Comment: Performed at [facility], [address] [place], [place] [address]  HIV Antibody (routine testing w rflx)     Status: None   Collection Time: 10/05/19  3:16 PM  Result Value Ref Range   HIV Screen 4th Generation wRfx NON REACTIVE NON REACTIVE    Comment: Performed at [place] [address] [place], [place] [address]  Fibrin derivatives D-Dimer [facility] only)     Status: None   Collection Time: 10/05/19  3:16 PM  Result Value Ref Range   Fibrin derivatives D-dimer ([facility]) 261.02 0.00 - 499.00 ng/mL (FEU)    Comment: (NOTE) <> Exclusion of Venous Thromboembolism (VTE) - OUTPATIENT ONLY   (Emergency Department or [place])   0-499 ng/ml (FEU): With a low to intermediate pretest probability                      for VTE this test result excludes the diagnosis                      of VTE.   >499 ng/ml (FEU) : VTE not excluded; additional work up for VTE is                      required. <> Testing on Inpatients and Evaluation of Disseminated Intravascular   Coagulation (DIC) Reference Range:   0-499 ng/ml (FEU) Performed at [facility], [address] [place], [place] [address]   C-reactive protein     Status: None   Collection Time: 10/05/19  3:16 PM  Result Value Ref Range   CRP 0.5 <1.0 mg/dL    Comment: Performed at [facility], [place] [address], [place], [place] [address]  Fibrin derivatives D-Dimer [facility] only)     Status: None    Collection Time: 10/06/19 10:46 AM  Result Value Ref Range   Fibrin derivatives D-dimer ([facility]) 216.65 0.00 - 499.00 ng/mL (FEU)    Comment: (NOTE) <> Exclusion of Venous Thromboembolism (VTE) - OUTPATIENT ONLY   (Emergency Department or [place])   0-499 ng/ml (FEU): With a low to intermediate pretest probability                      for VTE this test result excludes the diagnosis                      of VTE.   >499 ng/ml (FEU) : VTE not excluded; additional work up for VTE is                      required. <> Testing on Inpatients and Evaluation of Disseminated Intravascular   Coagulation (DIC) Reference Range:   0-499 ng/ml (FEU) Performed at [facility], [address] [place], [place] [address]   Basic metabolic panel     Status: Abnormal   Collection Time: 10/06/19 10:46 AM  Result Value Ref Range   Sodium 137 135 - 145 mmol/L   Potassium 3.9 3.5 - 5.1 mmol/L   Chloride 102 98 - 111 mmol/L   CO2 24 22 - 32 mmol/L   Glucose, Bld 110 (H) 70 - 99 mg/dL   BUN 13 6 - 20 mg/dL   Creatinine, Ser 0.73 0.61 - 1.24 mg/dL  Calcium 9.1 8.9 - 10.3 mg/dL   GFR calc non Af Amer >60 >60 mL/min   GFR calc Af Amer >60 >60 mL/min   Anion gap 11 5 - 15    Comment: Performed at [facility], [name] [place], [place] [id number]    Blood Alcohol level:  Lab Results  Component Value Date   [facility]   ETH <10 09/18/2018    Metabolic Disorder Labs: No results found for: HGBA1C, MPG No results found for: PROLACTIN No results found for: CHOL, TRIG, HDL, CHOLHDL, VLDL, LDLCALC  Physical Findings: AIMS:  , ,  ,  ,    CIWA:    COWS:     Musculoskeletal: Strength & Muscle Tone: within normal limits Gait & Station: unable to stand Patient leans: N/A  Psychiatric Specialty Exam: Physical Exam  Nursing note and vitals reviewed. Constitutional: He appears well-developed and well-nourished.  HENT:  Head: Normocephalic and atraumatic.  Eyes: Pupils  are equal, round, and reactive to light. Conjunctivae are normal.  Cardiovascular: Regular rhythm and normal heart sounds.  Respiratory: Effort normal. No respiratory distress.  GI: Soft.  Musculoskeletal:        General: Normal range of motion.     Cervical back: Normal range of motion.  Neurological: He is alert.  Skin: Skin is warm and dry.  Psychiatric: His affect is blunt. His speech is delayed. He is slowed and withdrawn. Thought content is paranoid. Cognition and memory are impaired. He expresses inappropriate judgment. He expresses no homicidal and no suicidal ideation.    Review of Systems  Constitutional: Negative.   HENT: Negative.   Eyes: Negative.   Respiratory: Negative.   Cardiovascular: Negative.   Gastrointestinal: Negative.   Musculoskeletal: Negative.   Skin: Negative.   Neurological: Negative.   Psychiatric/Behavioral: Positive for confusion and hallucinations.    Blood pressure 118/86, pulse 76, temperature 97.9 F (36.6 C), temperature source Oral, resp. rate 18, height 5\' 9"  (1.753 m), weight 103.9 kg, SpO2 97 %.Body mass index is 33.83 kg/m.  General Appearance: Disheveled  Eye Contact:  Minimal  Speech:  Slow  Volume:  Decreased  Mood:  Depressed and Dysphoric  Affect:  Constricted  Thought Process:  Coherent  Orientation:  Full (Time, Place, and Person)  Thought Content:  Hallucinations: Auditory, Paranoid Ideation and Rumination  Suicidal Thoughts:  Yes.  without intent/plan  Homicidal Thoughts:  No  Memory:  Immediate;   Fair Recent;   Fair Remote;   Fair  Judgement:  Impaired  Insight:  Shallow  Psychomotor Activity:  Decreased  Concentration:  Concentration: Poor  Recall:  Poor  Fund of Knowledge:  Poor  Language:  Fair  Akathisia:  No  Handed:  Right  AIMS (if indicated):     Assets:  Desire for Improvement  ADL's:  Impaired  Cognition:  Impaired,  Mild  Sleep:  Number of Hours: 5.5     Treatment Plan Summary: Daily contact with  patient to assess and evaluate symptoms and progress in treatment, Medication management and Plan Patient remains on isolation due to current coronavirus situation.  Vitals unremarkable.  Patient does not appear to be obviously ill other than fatigue.  Encouraged him to continue eating and drinking normally.  No change to medicine for today.  Hope that we can get his psychiatric symptoms improved soon so that we can work on discharge planning although it is not clear where he will be staying after discharge.  Mordecai RasmussenJohn Jaliah Foody, MD  10/06/2019, 3:06 PM

## 2019-10-06 NOTE — Progress Notes (Signed)
Recreation Therapy Notes   Date: 10/06/2019  Time: 9:30 am    Recreation Therapy group canceled due to COVID-19.     Lequisha Cammack LRT/CTRS        Olivya Sobol 10/06/2019 11:44 AM 

## 2019-10-06 NOTE — Plan of Care (Signed)
Patient irritable and depressed stating is was from being isolated to his room  Problem: Education: Goal: Emotional status will improve Outcome: Not Progressing Goal: Mental status will improve Outcome: Not Progressing

## 2019-10-06 NOTE — Plan of Care (Signed)
Patient stayed in bed with a depressed affect.Patient verbalized his frustration being in the room also.States "this is like a jail.I need to get out from here." Verbalized passive SI.Denies HI and AVH.No somatic issues verbalized except headache which subsided with Tylenol.Appetite good.Encouraged fluids.Compliant with medications.Support and encouragement given.

## 2019-10-06 NOTE — Progress Notes (Signed)
CSW spoke with Zack Brooks regarding process of referring COVID+ patients to the hotel voucher program. Zack provided CSW with contact number for Rebecca [phone] as the contact for [place]. CSW contacted Rebecca and left a voicemail requesting a call back.   Dottie Vaquerano, MSW, LCSW Clinical Social Work 10/06/2019 3:08 PM  

## 2019-10-06 NOTE — Plan of Care (Signed)
  Problem: Education: Goal: Knowledge of [place] [facility] Education information/materials will improve Outcome: Progressing Goal: Emotional status will improve Outcome: Progressing Goal: Mental status will improve Outcome: Progressing Goal: Verbalization of understanding the information provided will improve Outcome: Progressing  D: Patient continues in isolation. Has needed to be redirected to stay in his room and wear his mask a couple of times. Denies SI, HI and AVH. Complaining of not being allowed out of his room and asking when he can go home. A: continue to monitor for safety R: Safety maintained.

## 2019-10-06 NOTE — [facility] Group Notes (Signed)
LCSW Group Therapy Note   10/06/2019 1:00 PM  Type of Therapy and Topic:  Group Therapy:  Overcoming Obstacles   Participation Level:  Did Not Attend   Description of Group:    In this group patients will be encouraged to explore what they see as obstacles to their own wellness and recovery. They will be guided to discuss their thoughts, feelings, and behaviors related to these obstacles. The group will process together ways to cope with barriers, with attention given to specific choices patients can make. Each patient will be challenged to identify changes they are motivated to make in order to overcome their obstacles. This group will be process-oriented, with patients participating in exploration of their own experiences as well as giving and receiving support and challenge from other group members.   Therapeutic Goals: 1. Patient will identify personal and current obstacles as they relate to admission. 2. Patient will identify barriers that currently interfere with their wellness or overcoming obstacles.  3. Patient will identify feelings, thought process and behaviors related to these barriers. 4. Patient will identify two changes they are willing to make to overcome these obstacles:      Summary of Patient Progress Social Work group canceled due to Covid-19.      Therapeutic Modalities:   Cognitive Behavioral Therapy Solution Focused Therapy Motivational Interviewing Relapse Prevention Therapy  Jerrion Tabbert, MSW, LCSW 10/06/2019 1:12 PM   

## 2019-10-06 NOTE — Progress Notes (Signed)
Patient in his room upon arrival to the unit. Patient irritable during assessment denying SI/HI/AVH. Patient rated his leg pain 8/10, (See MAR). Patient frustrated with being isolated in his room due to covid. Patient compliant with medication administration per MD orders. Patient being monitored Q 15 minutes for safety per unit protocol. Patient remains safe on the unit.

## 2019-10-06 NOTE — Progress Notes (Signed)
D: Patient continues in isolation. Has needed to be redirected to stay in his room and wear his mask a couple of times. Denies SI, HI and AVH. Complaining of not being allowed out of his room and asking when he can go home. A: continue to monitor for safety R: Safety maintained.

## 2019-10-06 NOTE — Progress Notes (Signed)
Brief Hx: 43 y.o. male, who was admitted to behavioral health on 12/7 after he was found by the police to be agitated, paranoid and with disorganized thought.  He has history of bipolar versus schizoaffective disorder.  Patient was hospitalized 2 weeks back for psychotic symptoms as well and discharged on 09/23/19/2020.  He was last tested for COVID-19 on 12/7 and was negative.  With concern for exposure he was tested again for COVID-19 and is tested positive. Hospitalist consulted for admission to medical floor.  Subjective: As per nursing staffs no acute issue  Objective: Vital signs in last 24 hours:    Intake/Output from previous day: No intake/output data recorded. Intake/Output this shift: No intake/output data recorded.  General: Middle-aged obese   HEENT: Pupils reactive bilaterally, EOMI, no pallor, no icterus, moist mucosa, supple neck, no cervical pain with Chest: Clear to auscultation bilateral CVs: Normal S1-S2, no murmurs rubs or gallop GI: Soft, nondistended, nontender, bowel sounds present Musculoskeletal: Warm, no edema CNS: Alert and oriented, nonfocal  Results for orders placed or performed during the hospital encounter of 09/30/19 (from the past 24 hour(s))  Fibrin derivatives D-Dimer ([place] only)     Status: None   Collection Time: 10/06/19 10:46 AM  Result Value Ref Range   Fibrin derivatives D-dimer ([facility]) 216.65 0.00 - 499.00 ng/mL (FEU)  Basic metabolic panel     Status: Abnormal   Collection Time: 10/06/19 10:46 AM  Result Value Ref Range   Sodium 137 135 - 145 mmol/L   Potassium 3.9 3.5 - 5.1 mmol/L   Chloride 102 98 - 111 mmol/L   CO2 24 22 - 32 mmol/L   Glucose, Bld 110 (H) 70 - 99 mg/dL   BUN 13 6 - 20 mg/dL   Creatinine, Ser 0.73 0.61 - 1.24 mg/dL   Calcium 9.1 8.9 - 10.3 mg/dL   GFR calc non Af Amer >60 >60 mL/min   GFR calc Af Amer >60 >60 mL/min   Anion gap 11 5 - 15    Studies/Results: DG Lumbar Spine 2-3 Views  Result Date:  09/29/2019 CLINICAL DATA:  Fall. Mid low back pain. Generalized right knee pain. EXAM: LUMBAR SPINE - 2-3 VIEW COMPARISON:  CT of the abdomen pelvis 10/18/2017 FINDINGS: Five non rib-bearing lumbar type vertebral bodies are present. Vertebral body heights are stable. Slight degenerative anterolisthesis is again noted at L5-S1. Facet disease is again noted at L4-5 and L5-S1. Atherosclerotic changes are present the aorta. IMPRESSION: 1. Stable appearance of degenerative changes in the lower lumbar spine. 2. No acute or focal abnormality. 3. Atherosclerosis. Electronically Signed   By: San Morelle M.D.   On: 09/29/2019 08:45   DG Knee 2 Views Right  Result Date: 09/29/2019 CLINICAL DATA:  Generalized right knee pain EXAM: RIGHT KNEE - 1-2 VIEW COMPARISON:  MRI of the right knee 06/28/2017 FINDINGS: Sequela of ACL repair. Tricompartmental osteoarthritis with marginal spurring and relative sparing at the patellofemoral compartment. No acute fracture or malalignment. Negative for joint effusion. IMPRESSION: 1. No acute finding. 2. Tricompartmental osteoarthritis. 3. Prior ACL repair. Electronically Signed   By: Monte Fantasia M.D.   On: 09/29/2019 08:44   DG Chest Port 1 View  Result Date: 10/04/2019 CLINICAL DATA:  COVID 19 positive. Hx of cocaine abuse, current smoker. EXAM: PORTABLE CHEST 1 VIEW COMPARISON:  Chest radiograph 09/28/2019 FINDINGS: The heart size and mediastinal contours are within normal limits. There are a few linear opacities at the left base likely scarring or atelectasis. No new  focal infiltrate. No pneumothorax or large pleural effusion. The visualized skeletal structures are unremarkable. IMPRESSION: No evidence of active disease in the chest. Electronically Signed   By: Emmaline Kluver M.D.   On: 10/04/2019 18:31   DG Chest Port 1 View  Result Date: 09/29/2019 CLINICAL DATA:  Shortness of breath EXAM: PORTABLE CHEST 1 VIEW COMPARISON:  September 29, 2017 FINDINGS: The heart  size and mediastinal contours are within normal limits. Both lungs are clear. The visualized skeletal structures are unremarkable. IMPRESSION: No active disease. Electronically Signed   By: Katherine Mantle M.D.   On: 09/29/2019 00:08    Scheduled Meds: . ARIPiprazole  20 mg Oral Daily  . divalproex  1,500 mg Oral Q supper  . enoxaparin (LOVENOX) injection  40 mg Subcutaneous Q24H  . gabapentin  300 mg Oral TID  . mirtazapine  15 mg Oral QHS  . vitamin C  500 mg Oral Daily  . zinc sulfate  220 mg Oral Daily   Continuous Infusions: PRN Meds:acetaminophen, alum & mag hydroxide-simeth, diphenhydrAMINE, guaiFENesin-dextromethorphan, haloperidol lactate, LORazepam **OR** LORazepam, magnesium hydroxide  Assessment/Plan: COVID-19 virus infection:  -Continues to remain asymptomatic at this time - Inflammatory markers including CRP, D-dimer, fibrinogen, FDP and procalcitonin remained negative since admission -Chest x-ray from 10/04/2019 is negative for any acute cardiac pulmonary process  - patient maintaining sats on room air at rest and ambulation -Was not started on steroid, Actemra or remdesivir; need to hold on to those unless he has active respiratory symptoms or high inflammatory biomarkers.  Continue airborne and droplet precaution.  As needed antitussives.   - Added vitamin C and zinc.  Patient tested positive for COVID-19 and does not appear to have active symptoms at this time.  However , patient is currently homeless and cannot be discharged anywhere or placed into a shelter given his active COVID-19 infection status.    Supposed to be transferred to medical floor.    Bipolar affective disorder, mixed, severe, with psychotic behavior (HCC) Admitted with acute psychosis.   -Consulted psychiatry  -Continue to follow psych recommendation; psych recs - on Depakote and Abilify which is continued.  Continue Neurontin.  Also on Remeron at bedtime.  He cannot get any group therapy due  to COVID-19 status.  Acute kidney injury: Admission -Improved to baseline following admission Noted on admission with creatinine of 1.57.  Will monitor for now    Morbid obesity with BMI of 40.0-44.9, adult (HCC) -On diet and healthy lifestyle    Obstructive sleep apnea Not on CPAP   DVT Prophylaxis: Subcu Lovenox  Family Communication: Admission, patients condition and plan of care including tests being ordered have been discussed with the patient   Disposition: Case manager and social worker working on placement after discharge  Code Status full code  Likely DC to: Undetermined patient  Condition: Fair    LOS: 6 days   Cory Floyd Cory Floyd

## 2019-10-07 LAB — C-REACTIVE PROTEIN: CRP: 1 mg/dL — ABNORMAL HIGH (ref <1.0)

## 2019-10-07 LAB — FIBRIN DERIVATIVES D-DIMER (ARMC ONLY): Fibrin derivatives D-dimer (ARMC): 225.3 ng/mL (FEU) (ref 0.00–499.00)

## 2019-10-07 MED ORDER — ASCORBIC ACID 500 MG PO TABS: 500.0000 mg | ORAL_TABLET | Freq: Every day | ORAL | 0 refills | Status: DC

## 2019-10-07 MED ORDER — ASCORBIC ACID 500 MG PO TABS: 500.0000 mg | ORAL_TABLET | Freq: Every day | ORAL | 1 refills | Status: DC

## 2019-10-07 MED ORDER — IBUPROFEN 800 MG PO TABS: 800.0000 mg | ORAL_TABLET | Freq: Three times a day (TID) | ORAL | 0 refills | Status: DC | PRN

## 2019-10-07 MED ORDER — ARIPIPRAZOLE 20 MG PO TABS: 20.0000 mg | ORAL_TABLET | Freq: Every day | ORAL | 0 refills | Status: DC

## 2019-10-07 MED ORDER — GABAPENTIN 300 MG PO CAPS: 300.0000 mg | ORAL_CAPSULE | Freq: Three times a day (TID) | ORAL | 0 refills | Status: DC

## 2019-10-07 MED ORDER — DIVALPROEX SODIUM ER 500 MG PO TB24: 1500.0000 mg | ORAL_TABLET | Freq: Every day | ORAL | 1 refills | Status: DC

## 2019-10-07 MED ORDER — ACETAMINOPHEN 325 MG PO TABS: 650.0000 mg | ORAL_TABLET | Freq: Four times a day (QID) | ORAL | 0 refills | Status: DC | PRN

## 2019-10-07 MED ORDER — DIVALPROEX SODIUM ER 500 MG PO TB24: 1500.0000 mg | ORAL_TABLET | Freq: Every day | ORAL | 0 refills | Status: DC

## 2019-10-07 MED ORDER — GABAPENTIN 300 MG PO CAPS: 300.0000 mg | ORAL_CAPSULE | Freq: Three times a day (TID) | ORAL | 1 refills | Status: DC

## 2019-10-07 MED ORDER — ZINC SULFATE 220 (50 ZN) MG PO CAPS: 220.0000 mg | ORAL_CAPSULE | Freq: Every day | ORAL | 0 refills | Status: DC

## 2019-10-07 MED ORDER — ARIPIPRAZOLE 20 MG PO TABS: 20.0000 mg | ORAL_TABLET | Freq: Every day | ORAL | 1 refills | Status: DC

## 2019-10-07 MED ORDER — ZINC SULFATE 220 (50 ZN) MG PO CAPS: 220.0000 mg | ORAL_CAPSULE | Freq: Every day | ORAL | 1 refills | Status: DC

## 2019-10-07 MED ORDER — MIRTAZAPINE 15 MG PO TABS: 15.0000 mg | ORAL_TABLET | Freq: Every day | ORAL | 0 refills | Status: DC

## 2019-10-07 MED ORDER — MIRTAZAPINE 15 MG PO TABS: 15.0000 mg | ORAL_TABLET | Freq: Every day | ORAL | 1 refills | Status: DC

## 2019-10-07 NOTE — Discharge Summary (Signed)
Physician Discharge Summary Note  Patient:  Cory Floyd is an 43 y.o., male MRN:  [id number] DOB:  [birth year] Patient phone:  [phone] (home)  Patient address:   [place] [address],  Total Time spent with patient: 30 minutes  Date of Admission:  09/30/2019 Date of Discharge: October 07, 2019  Reason for Admission: Patient was admitted because of agitation confusion and delirium poor self-care  Principal Problem: Bipolar affective disorder, mixed, severe, with psychotic behavior [facility]) Discharge Diagnoses: Principal Problem:   Bipolar affective disorder, mixed, severe, with psychotic behavior (HCC) Active Problems:   Morbid obesity with BMI of 40.0-44.9, adult (HCC)   Obstructive sleep apnea   Psychotic disorder (HCC)   Bipolar 1 disorder (HCC)   Back pain   Arthritis   COVID-19 virus infection   Past Psychiatric History: Patient has a history of substance abuse bipolar disorder poor self-care  Past Medical History:  Past Medical History:  Diagnosis Date  . 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 Anginal pain (HCC)   . Arthritis   . Bipolar 1 disorder (HCC)   . Depressed   . Dyspnea   . Fatty liver   . Schizophrenia [facility])     Past Surgical History:  Procedure Laterality Date  . COLONOSCOPY  02/05/2014   diverticulosis, hyperplastic polyp x 2  . fracture arm Right   . KNEE SURGERY Right 2013  . NASAL SEPTUM SURGERY    . NASAL TURBINATE REDUCTION Bilateral 12/12/2016   Procedure: TURBINATE REDUCTION/SUBMUCOSAL RESECTION;  Surgeon: Linus Salmons, MD;  Location: [facility] ORS;  Service: ENT;  Laterality: Bilateral;   Family History:  Family History  Problem Relation Age of Onset  . Hepatitis C Mother   . Cancer Mother   . Cancer Maternal Aunt   . Hypertension Maternal Grandmother    Family Psychiatric  History: See previous Social History:  Social History   Substance and Sexual Activity  Alcohol Use Yes     Social History   Substance and  Sexual Activity  Drug Use No   Comment: history of cocaine per Dr Mikey Bussing H&P    Social History   Socioeconomic History  . Marital status: Divorced    Spouse name: Not on file  . Number of children: Not on file  . Years of education: Not on file  . Highest education level: Not on file  Occupational History  . Not on file  Tobacco Use  . Smoking status: Current Every Day Smoker    Packs/day: 1.00    Years: 20.00    Pack years: 20.00    Types: Cigarettes  . Smokeless tobacco: Never Used  Substance and Sexual Activity  . Alcohol use: Yes  . Drug use: No    Comment: history of cocaine per Dr Mikey Bussing H&P  . Sexual activity: Yes    Birth control/protection: None  Other Topics Concern  . Not on file  Social History Narrative  . Not on file   Social Determinants of Health   Financial Resource Strain:   . Difficulty of Paying Living Expenses: Not on file  Food Insecurity:   . Worried About Programme researcher, broadcasting/film/video in the Last Year: Not on file  . Ran Out of Food in the Last Year: Not on file  Transportation Needs:   . Lack of Transportation (Medical): Not on file  . Lack of Transportation (Non-Medical): Not on file  Physical Activity:   . Days of Exercise per Week: Not  on file  . Minutes of Exercise per Session: Not on file  Stress:   . Feeling of Stress : Not on file  Social Connections:   . Frequency of Communication with Friends and Family: Not on file  . Frequency of Social Gatherings with Friends and Family: Not on file  . Attends Religious Services: Not on file  . Active Member of Clubs or Organizations: Not on file  . Attends Banker Meetings: Not on file  . Marital Status: Not on file    Hospital Course: Patient was kept on the psychiatric unit.  15-minute checks employed.  Restarted on appropriate medication for bipolar disorder.  Did not display any dangerous suicidal or violent behavior on the unit.  Patient was retested for COVID-19 and found to  be positive after it was determined that he had been in contact with another patient in the emergency room who had been positive.  Patient was kept on isolation but did not display any overt symptoms of acute COVID-19 infection.  He was generally compliant with medicine.  Mental status cleared up over several days with delirium resolving and patient becoming appropriate with his reality testing.  At the time of discharge he is excepted to the COVID-19 hotel for transition and then states that he can use his money to find a hotel or find family after that.  Prescriptions given at discharge.  Patient at this point not acutely psychotic not suicidal or violent.  Physical Findings: AIMS:  , ,  ,  ,    CIWA:    COWS:     Musculoskeletal: Strength & Muscle Tone: within normal limits Gait & Station: normal Patient leans: N/A  Psychiatric Specialty Exam: Physical Exam  Nursing note and vitals reviewed. Constitutional: He appears well-developed and well-nourished.  HENT:  Head: Normocephalic and atraumatic.  Eyes: Pupils are equal, round, and reactive to light. Conjunctivae are normal.  Cardiovascular: Regular rhythm and normal heart sounds.  Respiratory: Effort normal. No respiratory distress.  GI: Soft.  Musculoskeletal:        General: Normal range of motion.     Cervical back: Normal range of motion.  Neurological: He is alert.  Skin: Skin is warm and dry.  Psychiatric: He has a normal mood and affect. His speech is normal and behavior is normal. Judgment and thought content normal. His affect is not blunt. Cognition and memory are normal.    Review of Systems  Constitutional: Negative.   HENT: Negative.   Eyes: Negative.   Respiratory: Negative.   Cardiovascular: Negative.   Gastrointestinal: Negative.   Musculoskeletal: Negative.   Skin: Negative.   Neurological: Negative.   Psychiatric/Behavioral: Negative.     Blood pressure 118/86, pulse 76, temperature 97.9 F (36.6 C),  temperature source Oral, resp. rate 18, height  (1.753 m), weight 103.9 kg, SpO2 97 %.Body mass index is 33.83 kg/m.  General Appearance: Casual  Eye Contact:  Good  Speech:  Clear and Coherent  Volume:  Normal  Mood:  Euthymic  Affect:  Congruent  Thought Process:  Coherent  Orientation:  Full (Time, Place, and Person)  Thought Content:  Logical  Suicidal Thoughts:  No  Homicidal Thoughts:  No  Memory:  Immediate;   Fair Recent;   Fair Remote;   Fair  Judgement:  Fair  Insight:  Fair  Psychomotor Activity:  Normal  Concentration:  Concentration: Fair  Recall:  [employer] of Knowledge:  Fair  Language:  Fair  Akathisia:  No  Handed:  Right  AIMS (if indicated):     Assets:  Desire for Improvement Resilience  ADL's:  Intact  Cognition:  WNL  Sleep:  Number of Hours: 2     Have you used any form of tobacco in the last 30 days? (Cigarettes, Smokeless Tobacco, Cigars, and/or Pipes): Yes  Has this patient used any form of tobacco in the last 30 days? (Cigarettes, Smokeless Tobacco, Cigars, and/or Pipes) Yes, Yes, A prescription for an FDA-approved tobacco cessation medication was offered at discharge and the patient refused  Blood Alcohol level:  Lab Results  Component Value Date   ETH <10 09/16/2019   ETH <10 16/04/3709    Metabolic Disorder Labs:  No results found for: HGBA1C, MPG No results found for: PROLACTIN No results found for: CHOL, TRIG, HDL, CHOLHDL, VLDL, LDLCALC  See Psychiatric Specialty Exam and Suicide Risk Assessment completed by Attending Physician prior to discharge.  Discharge destination:  Other:  Patient has been accepted to the hotel provided for recuperation for COVID-19.  He has been given the phone number of his bank and is making contact to ascertain his money and says he plans to find a hotel room afterwards.  He will be referred for outpatient follow-up and has been given prescriptions and if possible will get a 7-day supply of his  medicine at discharge as well  Is patient on multiple antipsychotic therapies at discharge:  No   Has Patient had three or more failed trials of antipsychotic monotherapy by history:  No  Recommended Plan for Multiple Antipsychotic Therapies: NA  Discharge Instructions    Increase activity slowly   Complete by: As directed    Increase activity slowly   Complete by: As directed      Allergies as of 10/07/2019      Reactions   Carrot [daucus Carota] Anaphylaxis, Rash   Penicillins Anaphylaxis, Hives   Has patient had a PCN reaction causing immediate rash, facial/tongue/throat swelling, SOB or lightheadedness with hypotension: Yes Has patient had a PCN reaction causing severe rash involving mucus membranes or skin necrosis: No Has patient had a PCN reaction that required hospitalization Yes Has patient had a PCN reaction occurring within the last 10 years: No If all of the above answers are "NO", then may proceed with Cephalosporin use.      Medication List    STOP taking these medications   meloxicam 15 MG tablet Commonly known as: MOBIC   propranolol 40 MG tablet Commonly known as: INDERAL   traZODone 100 MG tablet Commonly known as: DESYREL     TAKE these medications     Indication  acetaminophen 325 MG tablet Commonly known as: TYLENOL Take 2 tablets (650 mg total) by mouth every 6 (six) hours as needed for mild pain, moderate pain or fever.  Indication: Fever, Pain   ARIPiprazole 20 MG tablet Commonly known as: ABILIFY Take 1 tablet (20 mg total) by mouth daily. Start taking on: October 08, 2019 What changed:   medication strength  how much to take  Indication: Major Depressive Disorder, Manic Phase of Manic-Depression   ascorbic acid 500 MG tablet Commonly known as: VITAMIN C Take 1 tablet (500 mg total) by mouth daily.  Indication: Inadequate Vitamin C   divalproex 500 MG 24 hr tablet Commonly known as: DEPAKOTE ER Take 3 tablets (1,500 mg total)  by mouth daily with supper.  Indication: Manic Phase of Manic-Depression   gabapentin 300 MG capsule Commonly known as: NEURONTIN  Take 1 capsule (300 mg total) by mouth 3 (three) times daily.  Indication: Restless Leg Syndrome, Social Anxiety Disorder   ibuprofen 800 MG tablet Commonly known as: ADVIL Take 1 tablet (800 mg total) by mouth every 8 (eight) hours as needed for moderate pain (back pain).  Indication: Arthritis, Pain   mirtazapine 15 MG tablet Commonly known as: REMERON Take 1 tablet (15 mg total) by mouth at bedtime.  Indication: Major Depressive Disorder   zinc sulfate 220 (50 Zn) MG capsule Take 1 capsule (220 mg total) by mouth daily.  Indication: Impaired Wound Healing      Follow-up Information    [facility] Follow up.   Why: You have a teletherapy apopintment with Pam on 10/07/2019 at 11AM. You have a telepsych appointment with Lovina ReachAlicia Carter on 10/09/2019 at 10AM. Thank you! Contact information: [name] [place] [place] [address] [phone]           Follow-up recommendations:  Activity:  Activity as tolerated Diet:  Regular diet Other:  Follow-up outpatient treatment at [facility] and with his peers support  Comments: Patient is already followed at [facility].  He will continue there and meanwhile continue on medicines.  Prescriptions and 7-day supply given at discharge  Signed: Mordecai RasmussenJohn Atlee Kluth, MD 10/07/2019, 10:56 AM

## 2019-10-07 NOTE — Plan of Care (Signed)
  Problem: Group Participation Goal: STG - Patient will engage in groups without prompting or encouragement from LRT x3 group sessions within 5 recreation therapy group sessions Description: STG - Patient will engage in groups without prompting or encouragement from LRT x3 group sessions within 5 recreation therapy group sessions 10/07/2019 1102 by Ernest Haber, LRT Outcome: Not Applicable 56/31/4970 [id number] by Ernest Haber, LRT Outcome: Not Met (add Reason) Note: Patient did not attend any groups

## 2019-10-07 NOTE — Plan of Care (Signed)
  Problem: Education: Goal: Knowledge of [facility] Education information/materials will improve 10/07/2019 1145 by Leodis Liverpool, RN Outcome: Adequate for Discharge 10/07/2019 1144 by Leodis Liverpool, RN Outcome: Progressing Goal: Emotional status will improve 10/07/2019 1145 by Leodis Liverpool, RN Outcome: Adequate for Discharge 10/07/2019 1144 by Leodis Liverpool, RN Outcome: Progressing Goal: Mental status will improve 10/07/2019 1145 by Leodis Liverpool, RN Outcome: Adequate for Discharge 10/07/2019 1144 by Leodis Liverpool, RN Outcome: Progressing Goal: Verbalization of understanding the information provided will improve 10/07/2019 1145 by Leodis Liverpool, RN Outcome: Adequate for Discharge 10/07/2019 1144 by Leodis Liverpool, RN Outcome: Progressing   Problem: Safety: Goal: Periods of time without injury will increase 10/07/2019 1145 by Leodis Liverpool, RN Outcome: Adequate for Discharge 10/07/2019 1144 by Leodis Liverpool, RN Outcome: Progressing   Problem: Safety: Goal: Periods of time without injury will increase 10/07/2019 1145 by Leodis Liverpool, RN Outcome: Adequate for Discharge 10/07/2019 1144 by Leodis Liverpool, RN Outcome: Progressing   Problem: Activity: Goal: Will verbalize the importance of balancing activity with adequate rest periods 10/07/2019 1145 by Leodis Liverpool, RN Outcome: Adequate for Discharge 10/07/2019 1144 by Leodis Liverpool, RN Outcome: Progressing   Problem: Education: Goal: Will be free of psychotic symptoms 10/07/2019 1145 by Leodis Liverpool, RN Outcome: Adequate for Discharge 10/07/2019 1144 by Leodis Liverpool, RN Outcome: Progressing

## 2019-10-07 NOTE — Progress Notes (Signed)
Brief Hx: 43 y.o. male, who was admitted to behavioral health on 12/7 after he was found by the police to be agitated, paranoid and with disorganized thought.  He has history of bipolar versus schizoaffective disorder.  Patient was hospitalized 2 weeks back for psychotic symptoms as well and discharged on 09/23/19/2020.  He was last tested for COVID-19 on 12/7 and was negative.  With concern for exposure he was tested again for COVID-19 and is tested positive. Hospitalist consulted for admission to medical floor.  Subjective: Overnight patient was very upset as he was unable to sleep and he thought his sleep medication was not working per nursing staffs.  Objective: Vital signs in last 24 hours:    Intake/Output from previous day: No intake/output data recorded. Intake/Output this shift: No intake/output data recorded.  General: Middle-aged obese   HEENT: Pupils reactive bilaterally, EOMI, no pallor, no icterus, moist mucosa, supple neck, no cervical pain with Chest: Clear to auscultation bilateral CVs: Normal S1-S2, no murmurs rubs or gallop GI: Soft, nondistended, nontender, bowel sounds present Musculoskeletal: Warm, no edema CNS: Alert and oriented, nonfocal  Results for orders placed or performed during the hospital encounter of 09/30/19 (from the past 24 hour(s))  Fibrin derivatives D-Dimer ([place] only)     Status: None   Collection Time: 10/07/19  9:38 AM  Result Value Ref Range   Fibrin derivatives D-dimer ([facility]) 225.30 0.00 - 499.00 ng/mL (FEU)    Studies/Results: DG Lumbar Spine 2-3 Views  Result Date: 09/29/2019 CLINICAL DATA:  Fall. Mid low back pain. Generalized right knee pain. EXAM: LUMBAR SPINE - 2-3 VIEW COMPARISON:  CT of the abdomen pelvis 10/18/2017 FINDINGS: Five non rib-bearing lumbar type vertebral bodies are present. Vertebral body heights are stable. Slight degenerative anterolisthesis is again noted at L5-S1. Facet disease is again noted at L4-5 and L5-S1.  Atherosclerotic changes are present the aorta. IMPRESSION: 1. Stable appearance of degenerative changes in the lower lumbar spine. 2. No acute or focal abnormality. 3. Atherosclerosis. Electronically Signed   By: San Morelle M.D.   On: 09/29/2019 08:45   DG Knee 2 Views Right  Result Date: 09/29/2019 CLINICAL DATA:  Generalized right knee pain EXAM: RIGHT KNEE - 1-2 VIEW COMPARISON:  MRI of the right knee 06/28/2017 FINDINGS: Sequela of ACL repair. Tricompartmental osteoarthritis with marginal spurring and relative sparing at the patellofemoral compartment. No acute fracture or malalignment. Negative for joint effusion. IMPRESSION: 1. No acute finding. 2. Tricompartmental osteoarthritis. 3. Prior ACL repair. Electronically Signed   By: Monte Fantasia M.D.   On: 09/29/2019 08:44   DG Chest Port 1 View  Result Date: 10/04/2019 CLINICAL DATA:  COVID 19 positive. Hx of cocaine abuse, current smoker. EXAM: PORTABLE CHEST 1 VIEW COMPARISON:  Chest radiograph 09/28/2019 FINDINGS: The heart size and mediastinal contours are within normal limits. There are a few linear opacities at the left base likely scarring or atelectasis. No new focal infiltrate. No pneumothorax or large pleural effusion. The visualized skeletal structures are unremarkable. IMPRESSION: No evidence of active disease in the chest. Electronically Signed   By: Audie Pinto M.D.   On: 10/04/2019 18:31   DG Chest Port 1 View  Result Date: 09/29/2019 CLINICAL DATA:  Shortness of breath EXAM: PORTABLE CHEST 1 VIEW COMPARISON:  September 29, 2017 FINDINGS: The heart size and mediastinal contours are within normal limits. Both lungs are clear. The visualized skeletal structures are unremarkable. IMPRESSION: No active disease. Electronically Signed   By: Jamie Kato.D.  On: 09/29/2019 00:08    Scheduled Meds: . ARIPiprazole  20 mg Oral Daily  . divalproex  1,500 mg Oral Q supper  . enoxaparin (LOVENOX) injection  40 mg  Subcutaneous Q24H  . gabapentin  300 mg Oral TID  . mirtazapine  15 mg Oral QHS  . vitamin C  500 mg Oral Daily  . zinc sulfate  220 mg Oral Daily   Continuous Infusions: PRN Meds:acetaminophen, alum & mag hydroxide-simeth, diphenhydrAMINE, guaiFENesin-dextromethorphan, haloperidol lactate, LORazepam **OR** LORazepam, magnesium hydroxide  Assessment/Plan: COVID-19 virus infection: No acute issues since admission -Continues to remain asymptomatic at this time - Inflammatory markers including CRP, D-dimer, fibrinogen, FDP and procalcitonin remained negative since admission -Chest x-ray from 10/04/2019 is negative for any acute cardiac pulmonary process  - patient maintaining sats on room air at rest and ambulation -Was not started on steroid, Actemra or remdesivir; need to hold on to those unless he has active respiratory symptoms or high inflammatory biomarkers.  Continue airborne and droplet precaution.  As needed antitussives.   - Added vitamin C and zinc.  Patient tested positive for COVID-19 and does not appear to have active symptoms at this time.  However , patient is currently homeless and cannot be discharged anywhere or placed into a shelter given his active COVID-19 infection status.    Supposed to be transferred to medical floor.    Bipolar affective disorder, mixed, severe, with psychotic behavior (HCC) Admitted with acute psychosis.   -Consulted psychiatry  -Continue to follow psych recommendation; psych recs - on Depakote and Abilify which is continued.  Continue Neurontin.  Also on Remeron at bedtime.  He cannot get any group therapy due to COVID-19 status.  Acute kidney injury: Admission -Improved to baseline following admission Noted on admission with creatinine of 1.57.  Will monitor for now    Morbid obesity with BMI of 40.0-44.9, adult (HCC) -Counseled on healthy diet and lifestyle modification    Obstructive sleep apnea Not on CPAP   DVT Prophylaxis:  Subcu Lovenox  Family Communication: Admission, patients condition and plan of care including tests being ordered have been discussed with the patient   Disposition: Case manager and social worker working on placement after discharge  Code Status full code  Likely DC to: Undetermined patient  Condition: Fair    LOS: 7 days   Maleea Camilo Harold Hedge

## 2019-10-07 NOTE — Progress Notes (Signed)
Recreation Therapy Notes  INPATIENT RECREATION TR PLAN  Patient Details Name: Cory Floyd MRN: [id number] DOB: 1976/08/15 Today's Date: 10/07/2019  Rec Therapy Plan Is patient appropriate for Therapeutic Recreation?: Yes Treatment times per week: at least 3 Estimated Length of Stay: 5-7 days TR Treatment/Interventions: Group participation (Comment)  Discharge Criteria Pt will be discharged from therapy if:: Discharged Treatment plan/goals/alternatives discussed and agreed upon by:: Patient/family  Discharge Summary Short term goals set: Patient will engage in groups without prompting or encouragement from LRT x3 group sessions within 5 recreation therapy group sessions Short term goals met: Not met Reason goals not met: Patient did not attend any groups Therapeutic equipment acquired: N/A Reason patient discharged from therapy: Discharge from hospital Pt/family agrees with progress & goals achieved: Yes Date patient discharged from therapy: 10/07/19   [name] 10/07/2019, 11:04 AM

## 2019-10-07 NOTE — Progress Notes (Signed)
D: Patient is aware of  Discharge this shift .  A:Patient denies suicidal /homicidal ideations. Patient received all belongings brought in  No Storage medications. Writer reviewed Discharge Summary, Suicide Risk Assessment, and Transitional Record. Patient also received Prescriptions   from  MD. A 7 day supply of medications given to patient  . Aware  Of follow up appointment .  R: Patient left unit with no questions  Or concerns  With Staff for [employer]

## 2019-10-07 NOTE — Progress Notes (Signed)
  [facility] Adult Case Management Discharge Plan :  Will you be returning to the same living situation after discharge:  No. Pt going to hotel At discharge, do you have transportation home?: Yes,  Public Health will provide transportation Do you have the ability to pay for your medications: Yes,  medicare  Release of information consent forms completed and in the chart;   Patient to Follow up at: Follow-up [facility] Follow up.   Why: You have a teletherapy apopintment with Pam on 10/07/2019 at [place]. You have a telepsych appointment with Adele Barthel on 82/80/0349 at [place]. Thank you! Contact information: [place] [address] [phone]           Next level of care provider has access to [place] and Suicide Prevention discussed: Yes,  SPE completed with pt as pt declined collateral contact  Have you used any form of tobacco in the last 30 days? (Cigarettes, Smokeless Tobacco, Cigars, and/or Pipes): Yes  Has patient been referred to the Quitline?: Patient refused referral  Patient has been referred for addiction treatment: Pt. refused referral  Delfin Edis, LCSW 10/07/2019, 10:26 AM

## 2019-10-07 NOTE — Progress Notes (Signed)
[facility] MD Progress Note  10/07/2019 9:23 AM Cory Floyd  MRN:  [id number] Subjective: Follow-up note for this 43 year old man with a history of bipolar disorder chronic pain chronic homelessness and now laboratory test positive for COVID-19.  Patient seen chart reviewed.  I spoke with him in his room today.  Patient currently denies any symptoms of active COVID-19 infection.  He denies feeling feverish.  He denies cough or shortness of breath.  Denies feeling as though he has flulike symptoms.  Patient said he is frustrated at being in the hospital particularly as he has been confined to his own room.  He points out that he has been in prison in solitary confinement and that isolation in her room tends to make him feel worse mentally.  He tells me however today he is not having any hallucinations.  He denies any suicidal or homicidal thoughts.  He appeared lucid in his conversation and articulated to me that he wants to contact his bank to see if he has any money.  He says if he has money in the bank he could go to stay at a motel.  He has been compliant with medication.  He has not done anything to harm himself or been threatening to others. Principal Problem: Bipolar affective disorder, mixed, severe, with psychotic behavior (HCC) Diagnosis: Principal Problem:   Bipolar affective disorder, mixed, severe, with psychotic behavior (HCC) Active Problems:   Morbid obesity with BMI of 40.0-44.9, adult (HCC)   Obstructive sleep apnea   Psychotic disorder (HCC)   Bipolar 1 disorder (HCC)   Back pain   Arthritis   COVID-19 virus infection  Total Time spent with patient: 30 minutes  Past Psychiatric History: Patient has a past history of bipolar disorder homelessness a tendency to be somewhat "institutionalized" and his dependency on hospital stays at times.  Past Medical History:  Past Medical History:  Diagnosis Date  . 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 Anginal pain (HCC)   . Arthritis    . Bipolar 1 disorder (HCC)   . Depressed   . Dyspnea   . Fatty liver   . Schizophrenia [facility])     Past Surgical History:  Procedure Laterality Date  . COLONOSCOPY  02/05/2014   diverticulosis, hyperplastic polyp x 2  . fracture arm Right   . KNEE SURGERY Right 2013  . NASAL SEPTUM SURGERY    . NASAL TURBINATE REDUCTION Bilateral 12/12/2016   Procedure: TURBINATE REDUCTION/SUBMUCOSAL RESECTION;  Surgeon: Linus Salmons, MD;  Location: [facility] ORS;  Service: ENT;  Laterality: Bilateral;   Family History:  Family History  Problem Relation Age of Onset  . Hepatitis C Mother   . Cancer Mother   . Cancer Maternal Aunt   . Hypertension Maternal Grandmother    Family Psychiatric  History: See previous notes Social History:  Social History   Substance and Sexual Activity  Alcohol Use Yes     Social History   Substance and Sexual Activity  Drug Use No   Comment: history of cocaine per Dr Mikey Bussing H&P    Social History   Socioeconomic History  . Marital status: Divorced    Spouse name: Not on file  . Number of children: Not on file  . Years of education: Not on file  . Highest education level: Not on file  Occupational History  . Not on file  Tobacco Use  . Smoking status: Current Every Day Smoker    Packs/day: 1.00  Years: 20.00    Pack years: 20.00    Types: Cigarettes  . Smokeless tobacco: Never Used  Substance and Sexual Activity  . Alcohol use: Yes  . Drug use: No    Comment: history of cocaine per Dr Mikey BussingMcQueen's H&P  . Sexual activity: Yes    Birth control/protection: None  Other Topics Concern  . Not on file  Social History Narrative  . Not on file   Social Determinants of Health   Financial Resource Strain:   . Difficulty of Paying Living Expenses: Not on file  Food Insecurity:   . Worried About Programme researcher, broadcasting/film/videounning Out of Food in the Last Year: Not on file  . Ran Out of Food in the Last Year: Not on file  Transportation Needs:   . Lack of Transportation  (Medical): Not on file  . Lack of Transportation (Non-Medical): Not on file  Physical Activity:   . Days of Exercise per Week: Not on file  . Minutes of Exercise per Session: Not on file  Stress:   . Feeling of Stress : Not on file  Social Connections:   . Frequency of Communication with Friends and Family: Not on file  . Frequency of Social Gatherings with Friends and Family: Not on file  . Attends Religious Services: Not on file  . Active Member of Clubs or Organizations: Not on file  . Attends BankerClub or Organization Meetings: Not on file  . Marital Status: Not on file   Additional Social History:                         Sleep: Fair  Appetite:  Fair  Current Medications: Current Facility-Administered Medications  Medication Dose Route Frequency Provider Last Rate Last Admin  . acetaminophen (TYLENOL) tablet 650 mg  650 mg Oral Q6H PRN Dhungel, Nishant, MD   650 mg at 10/06/19 2103  . alum & mag hydroxide-simeth (MAALOX/MYLANTA) 200-200-20 MG/5ML suspension 30 mL  30 mL Oral Q4H PRN Cristofano, Paul A, MD      . ARIPiprazole (ABILIFY) tablet 20 mg  20 mg Oral Daily Antonieta Pertlary, Greg Lawson, MD   20 mg at 10/07/19 [id number]  . diphenhydrAMINE (BENADRYL) injection 25 mg  25 mg Intramuscular Q6H PRN Antonieta Pertlary, Greg Lawson, MD   25 mg at 10/07/19 0024  . divalproex (DEPAKOTE ER) 24 hr tablet 1,500 mg  1,500 mg Oral Q supper Cristofano, Worthy RancherPaul A, MD   1,500 mg at 10/06/19 1710  . enoxaparin (LOVENOX) injection 40 mg  40 mg Subcutaneous Q24H Dhungel, Nishant, MD   40 mg at 10/06/19 2103  . gabapentin (NEURONTIN) capsule 300 mg  300 mg Oral TID Clement Sayresristofano, Paul A, MD   300 mg at 10/07/19 [id number]  . guaiFENesin-dextromethorphan (ROBITUSSIN DM) 100-10 MG/5ML syrup 10 mL  10 mL Oral Q4H PRN Dhungel, Nishant, MD      . haloperidol lactate (HALDOL) injection 10 mg  10 mg Intramuscular Q6H PRN Antonieta Pertlary, Greg Lawson, MD      . LORazepam (ATIVAN) tablet 2 mg  2 mg Oral Q6H PRN Antonieta Pertlary, Greg Lawson, MD   2 mg at  10/07/19 0037   Or  . LORazepam (ATIVAN) injection 2 mg  2 mg Intramuscular Q6H PRN Antonieta Pertlary, Greg Lawson, MD      . magnesium hydroxide (MILK OF MAGNESIA) suspension 30 mL  30 mL Oral Daily PRN Cristofano, Worthy RancherPaul A, MD      . mirtazapine (REMERON) tablet 15 mg  15 mg Oral  QHS Money, Lowry Ram, FNP   15 mg at 10/06/19 2103  . vitamin C (ASCORBIC ACID) tablet 500 mg  500 mg Oral Daily Dhungel, Nishant, MD   500 mg at 10/07/19 8756  . zinc sulfate capsule 220 mg  220 mg Oral Daily Dhungel, Nishant, MD   220 mg at 10/06/19 4332    Lab Results:  Results for orders placed or performed during the hospital encounter of 09/30/19 (from the past 48 hour(s))  HIV Antibody (routine testing w rflx)     Status: None   Collection Time: 10/05/19  3:16 PM  Result Value Ref Range   HIV Screen 4th Generation wRfx NON REACTIVE NON REACTIVE    Comment: Performed at [facility], [address], [place], [place] [address]  Fibrin derivatives D-Dimer [facility] only)     Status: None   Collection Time: 10/05/19  3:16 PM  Result Value Ref Range   Fibrin derivatives D-dimer ([facility]) 261.02 0.00 - 499.00 ng/mL (FEU)    Comment: (NOTE) <> Exclusion of Venous Thromboembolism (VTE) - OUTPATIENT ONLY   (Emergency Department or [place])   0-499 ng/ml (FEU): With a low to intermediate pretest probability                      for VTE this test result excludes the diagnosis                      of VTE.   >499 ng/ml (FEU) : VTE not excluded; additional work up for VTE is                      required. <> Testing on Inpatients and Evaluation of Disseminated Intravascular   Coagulation (DIC) Reference Range:   0-499 ng/ml (FEU) Performed at [facility], [address] [place], [place] [address]   C-reactive protein     Status: None   Collection Time: 10/05/19  3:16 PM  Result Value Ref Range   CRP 0.5 <1.0 mg/dL    Comment: Performed at [facility], [place] [address], [place], [place] [address]  Fibrin  derivatives D-Dimer [facility] only)     Status: None   Collection Time: 10/06/19 10:46 AM  Result Value Ref Range   Fibrin derivatives D-dimer ([facility]) 216.65 0.00 - 499.00 ng/mL (FEU)    Comment: (NOTE) <> Exclusion of Venous Thromboembolism (VTE) - OUTPATIENT ONLY   (Emergency Department or [place])   0-499 ng/ml (FEU): With a low to intermediate pretest probability                      for VTE this test result excludes the diagnosis                      of VTE.   >499 ng/ml (FEU) : VTE not excluded; additional work up for VTE is                      required. <> Testing on Inpatients and Evaluation of Disseminated Intravascular   Coagulation (DIC) Reference Range:   0-499 ng/ml (FEU) Performed at [facility], [address] [place],  [address]   Basic metabolic panel     Status: Abnormal   Collection Time: 10/06/19 10:46 AM  Result Value Ref Range   Sodium 137 135 - 145 mmol/L   Potassium 3.9 3.5 - 5.1 mmol/L  Chloride 102 98 - 111 mmol/L   CO2 24 22 - 32 mmol/L   Glucose, Bld 110 (H) 70 - 99 mg/dL   BUN 13 6 - 20 mg/dL   Creatinine, Ser 6.94 0.61 - 1.24 mg/dL   Calcium 9.1 8.9 - 85.4 mg/dL   GFR calc non Af Amer >60 >60 mL/min   GFR calc Af Amer >60 >60 mL/min   Anion gap 11 5 - 15    Comment: Performed at [facility], [address] [place], [place] [address]  C-reactive protein     Status: None   Collection Time: 10/06/19 10:47 AM  Result Value Ref Range   CRP 0.8 <1.0 mg/dL    Comment: Performed at [facility], [address] [place], [place] [address]    Blood Alcohol level:  Lab Results  Component Value Date   ETH <10 09/16/2019   ETH <10 09/18/2018    Metabolic Disorder Labs: No results found for: HGBA1C, MPG No results found for: PROLACTIN No results found for: CHOL, TRIG, HDL, CHOLHDL, VLDL, LDLCALC  Physical Findings: AIMS:  , ,  ,  ,    CIWA:    COWS:     Musculoskeletal: Strength & Muscle Tone: within normal  limits Gait & Station: normal Patient leans: N/A  Psychiatric Specialty Exam: Physical Exam  Nursing note and vitals reviewed. Constitutional: He appears well-developed and well-nourished.  HENT:  Head: Normocephalic and atraumatic.  Eyes: Pupils are equal, round, and reactive to light. Conjunctivae are normal.  Cardiovascular: Regular rhythm and normal heart sounds.  Respiratory: Effort normal. No respiratory distress.  GI: Soft.  Musculoskeletal:        General: Normal range of motion.     Cervical back: Normal range of motion.  Neurological: He is alert.  Skin: Skin is warm and dry.  Psychiatric: He has a normal mood and affect. His speech is normal and behavior is normal. Judgment and thought content normal. Cognition and memory are normal.    Review of Systems  Constitutional: Negative.   HENT: Negative.   Eyes: Negative.   Respiratory: Negative.   Cardiovascular: Negative.   Gastrointestinal: Negative.   Musculoskeletal: Negative.   Skin: Negative.   Neurological: Negative.   Psychiatric/Behavioral: Negative for agitation, behavioral problems, confusion, decreased concentration, dysphoric mood, hallucinations, self-injury and suicidal ideas. The patient is nervous/anxious.     Blood pressure 118/86, pulse 76, temperature 97.9 F (36.6 C), temperature source Oral, resp. rate 18, height 5\' 9"  (1.753 m), weight 103.9 kg, SpO2 97 %.Body mass index is 33.83 kg/m.  General Appearance: Disheveled  Eye Contact:  Fair  Speech:  Clear and Coherent  Volume:  Normal  Mood:  Euthymic  Affect:  Constricted  Thought Process:  Coherent  Orientation:  Full (Time, Place, and Person)  Thought Content:  Logical  Suicidal Thoughts:  No  Homicidal Thoughts:  No  Memory:  Immediate;   Fair Recent;   Fair Remote;   Fair  Judgement:  Fair  Insight:  Fair  Psychomotor Activity:  Normal  Concentration:  Concentration: Fair  Recall:  of Knowledge:  Fair  Language:  Fair   Akathisia:  No  Handed:  Right  AIMS (if indicated):     Assets:  Desire for Improvement Resilience  ADL's:  Intact  Cognition:  WNL  Sleep:  Number of Hours: 2     Treatment Plan Summary: Daily contact with patient to assess and evaluate symptoms and  progress in treatment, Medication management and Plan Psychiatrically he appears to be stable.  Does not appear to be actively psychotic and is not threatening himself or others.  He articulates an appropriate plan for taking care of himself in the immediate future.  From a psychiatric standpoint we would be prepared to discharge him as long as we have some sort of plan as to where he will be staying.  I have given him the telephone number for his bank which he requested.  He says if he can call them he should be able to find out if he has any money in the bank.  Treatment team has reviewed his situation this morning.  We have requested review by the COVID-19 positive "hotel" for patients but have not received a reply as of this morning.  No change indicated to his current medications.  I will begin some preparations for discharge as I think we RN the planning stage for getting him out of the hospital if we can have some assurance that he will have a reasonable place to go.  Mordecai Rasmussen, MD 10/07/2019, 9:23 AM

## 2019-10-07 NOTE — Progress Notes (Signed)
CSW received a call back from [place] with [facility] who reported that they are currently full and are only accepting [employer] at this time. Debbie reported that pt would have to have a shelter placement prior to being accepted into the hotel program, and all their shelters are currently full.   Evalina Field, MSW, LCSW Clinical Social Work 10/07/2019 9:10 AM

## 2019-10-07 NOTE — Progress Notes (Signed)
CSW spoke with [place] from [facility] [phone] ext [phone]. Selena Batten reported that she would need a few questions answered to assist with placing pt in a hotel due to pt being homeless and having [place]. Selena Batten reported she would need to know how many days pt would need to be housed at hotel, smoking/non smoking room, and if there will be transportation to get pt to the hotel.   CSW spoke with AD Warner Mccreedy who reported pt would need to be housed for 7 days and would probably prefer a smoking room and encouraged CSW to contact [employer] to see if they will be able to transport pt due to hotel being in close proximity to the hospital.  CSW contacted safe transport who reported that their vendor will not transport anyone who has tested positive for COVID.  CSW contacted Becca back and informed her pt would need hotel for 7 days, smoking room, and does not have any transportation available. Selena Batten reported that she would be able to provide transportation for pt at discharge to the hotel and get pt checked in. Selena Batten reported she would be able to get pts prescriptions filled so they will have their medications in the hotel with them and that she will provide them with some groceries and hygiene products to last for the week at the hotel. Selena Batten reported she should be able to get pt between 12 and 1pm and would call CSW back after she gets the hotel room booked.  CSW notified Dr. Weber Cooks and AD Warner Mccreedy of discharge plan.   Evalina Field, MSW, LCSW Clinical Social Work 10/07/2019 9:56 AM

## 2019-10-07 NOTE — BHH Suicide Risk Assessment (Signed)
[facility] Discharge Suicide Risk Assessment   Principal Problem: Bipolar affective disorder, mixed, severe, with psychotic behavior [facility]) Discharge Diagnoses: Principal Problem:   Bipolar affective disorder, mixed, severe, with psychotic behavior ([place]) Active Problems:   Morbid obesity with BMI of 40.0-44.9, adult (HCC)   Obstructive sleep apnea   Psychotic disorder ([name])   Bipolar 1 disorder (HCC)   Back pain   Arthritis   COVID-19 virus infection   Total Time spent with patient: 30 minutes  Musculoskeletal: Strength & Muscle Tone: within normal limits Gait & Station: normal Patient leans: N/A  Psychiatric Specialty Exam: Review of Systems  Constitutional: Negative.   HENT: Negative.   Eyes: Negative.   Respiratory: Negative.   Cardiovascular: Negative.   Gastrointestinal: Negative.   Musculoskeletal: Negative.   Skin: Negative.   Neurological: Negative.   Psychiatric/Behavioral: Negative.     Blood pressure 118/86, pulse 76, temperature 97.9 F (36.6 C), temperature source Oral, resp. rate 18, height 5\' 9"  (1.753 m), weight 103.9 kg, SpO2 97 %.Body mass index is 33.83 kg/m.  General Appearance: Casual  Eye Contact::  Good  Speech:  Clear and [id number]  Volume:  Normal  Mood:  Euthymic  Affect:  Congruent  Thought Process:  Goal Directed  Orientation:  Full (Time, Place, and Person)  Thought Content:  Logical  Suicidal Thoughts:  No  Homicidal Thoughts:  No  Memory:  Immediate;   Fair Recent;   Fair Remote;   Fair  Judgement:  Fair  Insight:  Fair  Psychomotor Activity:  Normal  Concentration:  Fair  Recall:  [employer] of Knowledge:Fair  Language: Fair  Akathisia:  No  Handed:  Right  AIMS (if indicated):     Assets:  Desire for Improvement Resilience  Sleep:  Number of Hours: 2  Cognition: WNL  ADL's:  Intact   Mental Status Per Nursing Assessment::   On Admission:  NA  Demographic Factors:  Male, Caucasian, Living alone and Unemployed  Loss  Factors: Financial problems/change in socioeconomic status  Historical Factors: Impulsivity  Risk Reduction Factors:   Religious beliefs about death and Positive therapeutic relationship  Continued Clinical Symptoms:  Bipolar Disorder:   Mixed State Alcohol/Substance Abuse/Dependencies  Cognitive Features That Contribute To Risk:  None    Suicide Risk:  Minimal: No identifiable suicidal ideation.  Patients presenting with no risk factors but with morbid ruminations; may be classified as minimal risk based on the severity of the depressive symptoms  Follow-up [facility] Follow up.   Why: You have a teletherapy apopintment with Pam on 10/07/2019 at [place]. You have a telepsych appointment with Adele Barthel on 35/70/1779 at [place]. Thank you! Contact information: [place] [address] [phone]           Plan Of Care/Follow-up recommendations:  Activity:  Activity as tolerated Diet:  Regular diet Other:  Follow-up with [place] in your peers support provider.  Stay on medication.  Alethia Berthold, MD 10/07/2019, 9:58 AM

## 2019-10-07 NOTE — Progress Notes (Signed)
Recreation Therapy Notes  Date: 10/07/2019  Time: 9:30 am    Recreation Therapy group canceled due to COVID-19.     Jahbari Repinski LRT/CTRS        Mylen Mangan 10/07/2019 10:56 AM 

## 2019-10-07 NOTE — Progress Notes (Signed)
Patient came out of his room stating his sleeping medication wasn't working. Patient irritable and visibly upset by not being able to sleep. Patient given PRN medications per MD orders. (See MAR)

## 2019-10-15 ENCOUNTER — Encounter: Payer: Self-pay | Admitting: Emergency Medicine

## 2019-10-15 ENCOUNTER — Other Ambulatory Visit: Payer: Self-pay

## 2019-10-15 ENCOUNTER — Emergency Department
Admission: EM | Admit: 2019-10-15 | Discharge: 2019-10-19 | Disposition: A | Discharge status: Other Acute Inpatient Hospital | Payer: Medicare Other | Attending: Emergency Medicine | Admitting: Emergency Medicine

## 2019-10-15 DIAGNOSIS — R45851 Suicidal ideations: Secondary | ICD-10-CM | POA: Insufficient documentation

## 2019-10-15 DIAGNOSIS — F3131 Bipolar disorder, current episode depressed, mild: Secondary | ICD-10-CM | POA: Insufficient documentation

## 2019-10-15 DIAGNOSIS — Z79899 Other long term (current) drug therapy: Secondary | ICD-10-CM | POA: Insufficient documentation

## 2019-10-15 DIAGNOSIS — Z20828 Contact with and (suspected) exposure to other viral communicable diseases: Secondary | ICD-10-CM | POA: Insufficient documentation

## 2019-10-15 DIAGNOSIS — F3164 Bipolar disorder, current episode mixed, severe, with psychotic features: Secondary | ICD-10-CM | POA: Diagnosis present

## 2019-10-15 DIAGNOSIS — F17210 Nicotine dependence, cigarettes, uncomplicated: Secondary | ICD-10-CM | POA: Insufficient documentation

## 2019-10-15 LAB — URINE DRUG SCREEN, QUALITATIVE (ARMC ONLY)
Amphetamines, Ur Screen: NOT DETECTED
Barbiturates, Ur Screen: NOT DETECTED
Benzodiazepine, Ur Scrn: NOT DETECTED
Cannabinoid 50 Ng, Ur ~~LOC~~: NOT DETECTED
Cocaine Metabolite,Ur ~~LOC~~: POSITIVE — AB
MDMA (Ecstasy)Ur Screen: NOT DETECTED
Methadone Scn, Ur: NOT DETECTED
Opiate, Ur Screen: NOT DETECTED
Phencyclidine (PCP) Ur S: NOT DETECTED
Tricyclic, Ur Screen: NOT DETECTED

## 2019-10-15 LAB — CBC
HCT: 40.3 % (ref 39.0–52.0)
Hemoglobin: 13.3 g/dL (ref 13.0–17.0)
MCH: 30.9 pg (ref 26.0–34.0)
MCHC: 33 g/dL (ref 30.0–36.0)
MCV: 93.7 fL (ref 80.0–100.0)
Platelets: 187 10*3/uL (ref 150–400)
RBC: 4.3 MIL/uL (ref 4.22–5.81)
RDW: 13.2 % (ref 11.5–15.5)
WBC: 6.1 10*3/uL (ref 4.0–10.5)
nRBC: 0 % (ref 0.0–0.2)

## 2019-10-15 LAB — COMPREHENSIVE METABOLIC PANEL
ALT: 26 U/L (ref 0–44)
AST: 25 U/L (ref 15–41)
Albumin: 3.5 g/dL (ref 3.5–5.0)
Alkaline Phosphatase: 52 U/L (ref 38–126)
Anion gap: 10 (ref 5–15)
BUN: 11 mg/dL (ref 6–20)
CO2: 23 mmol/L (ref 22–32)
Calcium: 8.2 mg/dL — ABNORMAL LOW (ref 8.9–10.3)
Chloride: 107 mmol/L (ref 98–111)
Creatinine, Ser: 0.74 mg/dL (ref 0.61–1.24)
GFR calc Af Amer: >60 mL/min (ref >60)
GFR calc non Af Amer: >60 mL/min (ref >60)
Glucose, Bld: 110 mg/dL — ABNORMAL HIGH (ref 70–99)
Potassium: 3.5 mmol/L (ref 3.5–5.1)
Sodium: 140 mmol/L (ref 135–145)
Total Bilirubin: 0.4 mg/dL (ref 0.3–1.2)
Total Protein: 6.9 g/dL (ref 6.5–8.1)

## 2019-10-15 LAB — ACETAMINOPHEN LEVEL: Acetaminophen (Tylenol), Serum: <10 ug/mL — ABNORMAL LOW (ref 10–30)

## 2019-10-15 LAB — SALICYLATE LEVEL: Salicylate Lvl: <7 mg/dL — ABNORMAL LOW (ref 7.0–30.0)

## 2019-10-15 LAB — ETHANOL: Alcohol, Ethyl (B): <10 mg/dL (ref <10)

## 2019-10-15 MED ORDER — DIVALPROEX SODIUM 250 MG PO DR TAB
250.0000 mg | DELAYED_RELEASE_TABLET | Freq: Two times a day (BID) | ORAL | Status: DC
  Administered 2019-10-15 – 2019-10-19 (×9): 250 mg via ORAL
  Filled 2019-10-15 (×9): qty 1

## 2019-10-15 MED ORDER — GABAPENTIN 300 MG PO CAPS
300.0000 mg | ORAL_CAPSULE | Freq: Three times a day (TID) | ORAL | Status: DC
  Administered 2019-10-15 – 2019-10-19 (×13): 300 mg via ORAL
  Filled 2019-10-15 (×13): qty 1

## 2019-10-15 MED ORDER — ARIPIPRAZOLE 10 MG PO TABS
10.0000 mg | ORAL_TABLET | Freq: Every day | ORAL | Status: DC
  Administered 2019-10-15 – 2019-10-19 (×5): 10 mg via ORAL
  Filled 2019-10-15 (×5): qty 1

## 2019-10-15 NOTE — ED Provider Notes (Signed)
[facility] Emergency Department Provider Note   ____________________________________________    I have reviewed the triage vital signs and the nursing notes.   HISTORY  Chief Complaint Psychiatric Evaluation     HPI Cory Floyd is a 43 y.o. male with history of bipolar disorder, depression, paranoia who presents with complaints of severe depression.  He reports he has been considering purchasing a gun to harm himself.  He reports he has been depressed since his mother died 3 years ago.  He has not harmed himself, denies ingestion, no alcohol recently.  Review of medical record demonstrates recent behavioral  admission, diagnosed with COVID-19 during that stay, asymptomatic in the hospital and remains asymptomatic.  Positive Covid swab on 12/12  Past Medical History:  Diagnosis Date  . 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 Anginal pain ([place])   . Arthritis   . Bipolar 1 disorder ([place])   . Depressed   . Dyspnea   . Fatty liver   . Schizophrenia [facility])     Patient Active Problem List   Diagnosis Date Noted  . COVID-19 virus infection 10/04/2019  . Bipolar 1 disorder ([place]) 09/30/2019  . Bipolar affective disorder, mixed, severe, with psychotic behavior ([place]) 09/30/2019  . Back pain 09/30/2019  . Arthritis 09/30/2019  . Bipolar disorder without psychotic features ([place]) 09/18/2019  . Paranoia ([place]) 09/17/2019  . Psychosis ([place]) 09/17/2019  . Psychotic disorder ([place]) 09/17/2019  . Gastroesophageal reflux disease without esophagitis 09/21/2016  . Obstructive sleep apnea 09/21/2016  . Morbid obesity with BMI of 40.0-44.9, adult ([place]) 08/15/2016  . Stable angina pectoris ([place]) 08/15/2016    Past Surgical History:  Procedure Laterality Date  . COLONOSCOPY  02/05/2014   diverticulosis, hyperplastic polyp x 2  . fracture arm Right   . KNEE SURGERY Right 2013  . NASAL SEPTUM SURGERY    . NASAL TURBINATE REDUCTION Bilateral 12/12/2016   Procedure: TURBINATE REDUCTION/SUBMUCOSAL RESECTION;  Surgeon: Beverly Gust, MD;  Location: [facility] ORS;  Service: ENT;  Laterality: Bilateral;    Prior to Admission medications   Medication Sig Start Date End Date Taking? Authorizing Provider  acetaminophen (TYLENOL) 325 MG tablet Take 2 tablets (650 mg total) by mouth every 6 (six) hours as needed for mild pain, moderate pain or fever. 10/07/19   Clapacs, [name], MD  ARIPiprazole (ABILIFY) 20 MG tablet Take 1 tablet (20 mg total) by mouth daily. 10/08/19   Clapacs, [name], MD  ascorbic acid (VITAMIN C) 500 MG tablet Take 1 tablet (500 mg total) by mouth daily. 10/07/19   Clapacs, [name], MD  divalproex (DEPAKOTE ER) 500 MG 24 hr tablet Take 3 tablets (1,500 mg total) by mouth daily with supper. 10/07/19   Clapacs, [name], MD  gabapentin (NEURONTIN) 300 MG capsule Take 1 capsule (300 mg total) by mouth 3 (three) times daily. 10/07/19   Clapacs, [name], MD  ibuprofen (ADVIL) 800 MG tablet Take 1 tablet (800 mg total) by mouth every 8 (eight) hours as needed for moderate pain (back pain). 10/07/19   Clapacs, [name], MD  mirtazapine (REMERON) 15 MG tablet Take 1 tablet (15 mg total) by mouth at bedtime. 10/07/19   Clapacs, [name], MD  zinc sulfate 220 (50 Zn) MG capsule Take 1 capsule (220 mg total) by mouth daily. 10/07/19   Clapacs, [name], MD     Allergies Carrot [daucus carota] and Penicillins  Family History  Problem Relation Age  of Onset  . Hepatitis C Mother   . Cancer Mother   . Cancer Maternal Aunt   . Hypertension Maternal Grandmother     Social History Social History   Tobacco Use  . Smoking status: Current Every Day Smoker    Packs/day: 1.00    Years: 20.00    Pack years: 20.00    Types: Cigarettes  . Smokeless tobacco: Never Used  Substance Use Topics  . Alcohol use: Yes  . Drug use: No    Comment: history of cocaine per Dr Mikey Bussing H&P    Review of Systems  Constitutional: No fever/chills Eyes: No visual  changes.  ENT: No sore throat. Cardiovascular: Denies chest pain. Respiratory: Denies shortness of breath. Gastrointestinal: No abdominal pain.   Genitourinary: Negative for dysuria. Musculoskeletal: Negative for back pain. Skin: Negative for rash. Neurological: Negative for headache  ____________________________________________   PHYSICAL EXAM:  VITAL SIGNS: ED Triage Vitals  Enc Vitals Group     BP 10/15/19 0953 126/78     Pulse Rate 10/15/19 0953 92     Resp 10/15/19 0953 18     Temp 10/15/19 0953 99 F (37.2 C)     Temp Source 10/15/19 0953 Oral     SpO2 10/15/19 0953 98 %     Weight 10/15/19 0954 103.9 kg (229 lb)     Height 10/15/19 0954 1.753 m (5\' 9" )     Head Circumference --      Peak Flow --      Pain Score 10/15/19 0954 7     Pain Loc --      Pain Edu? --      Excl. in GC? --     Constitutional: Alert and oriented.  Nose: No congestion/rhinnorhea. Mouth/Throat: Mucous membranes are moist.    Cardiovascular: Normal rate, regular rhythm. Grossly normal heart sounds.  Good peripheral circulation. Respiratory: Normal respiratory effort.  No retractions. Lungs CTAB. Gastrointestinal: Soft and nontender. No distention.   Musculoskeletal:Warm and well perfused Neurologic:  Normal speech and language. No gross focal neurologic deficits are appreciated.  Skin:  Skin is warm, dry and intact. No rash noted. Psychiatric: Mood and affect are normal. Speech and behavior are normal.  ____________________________________________   LABS (all labs ordered are listed, but only abnormal results are displayed)  Labs Reviewed  COMPREHENSIVE METABOLIC PANEL  ETHANOL  SALICYLATE LEVEL  ACETAMINOPHEN LEVEL  CBC  URINE DRUG SCREEN, QUALITATIVE ([facility] ONLY)   ____________________________________________  EKG  None ____________________________________________  RADIOLOGY  None ____________________________________________   PROCEDURES  Procedure(s) performed:  No  Procedures   Critical Care performed: No ____________________________________________   INITIAL IMPRESSION / ASSESSMENT AND PLAN / ED COURSE  Pertinent labs & imaging results that were available during my care of the patient were reviewed by me and considered in my medical decision making (see chart for details).  Patient presents with depression and suicidal ideation in the setting of a history of psychiatric illness.  Vital signs are reassuring, exam is reassuring, pending labs, will consult psychiatry and TTS.  I have completed IVC papers   Patient seen by psychiatry, they will admit the patient    ____________________________________________   FINAL CLINICAL IMPRESSION(S) / ED DIAGNOSES  Final diagnoses:  Suicidal ideation        Note:  This document was prepared using Dragon voice recognition software and may include unintentional dictation errors.   10/17/19, MD 10/15/19 [phone]

## 2019-10-15 NOTE — BH Assessment (Signed)
Assessment Note  Cory Floyd is an 43 y.o. male who presents to the ER due to increase paranoia and A/H. He states he hasn't taking his medications as prescribed, since was last inpatient with [facility].  He is having command hallucinations. The voices are telling him to end his life but several different means. He also reports of having alcohol use but was unable to remember the last time he had a drank.  During the interview, the patient was calm, cooperative and pleasant. He was able to provide appropriate answers to the questions. He denies history of aggression and violence. He has an upcoming court date on 02/11/2019 for identity theft and no other involvement with the legal system.   Diagnosis: Bipolar  Past Medical History:  Past Medical History:  Diagnosis Date  . 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 Anginal pain (HCC)   . Arthritis   . Bipolar 1 disorder (HCC)   . Depressed   . Dyspnea   . Fatty liver   . Schizophrenia [facility](HCC)     Past Surgical History:  Procedure Laterality Date  . COLONOSCOPY  02/05/2014   diverticulosis, hyperplastic polyp x 2  . fracture arm Right   . KNEE SURGERY Right 2013  . NASAL SEPTUM SURGERY    . NASAL TURBINATE REDUCTION Bilateral 12/12/2016   Procedure: TURBINATE REDUCTION/SUBMUCOSAL RESECTION;  Surgeon: Linus Salmonshapman [name], MD;  Location: [facility] ORS;  Service: ENT;  Laterality: Bilateral;    Family History:  Family History  Problem Relation Age of Onset  . Hepatitis C Mother   . Cancer Mother   . Cancer Maternal Aunt   . Hypertension Maternal Grandmother     Social History:  reports that he has been smoking cigarettes. He has a 20.00 pack-year smoking history. He has never used smokeless tobacco. He reports current alcohol use. He reports that he does not use drugs.  Additional Social History:  Alcohol / Drug Use Pain Medications: See PTA Prescriptions: See PTA Over the Counter: See PTA History of alcohol / drug use?: Yes Longest  period of sobriety (when/how long): Unable to quantify Substance #1 Name of Substance 1: Alcohol  CIWA: CIWA-Ar BP: 126/78 Pulse Rate: 92 Nausea and Vomiting: no nausea and no vomiting Tactile Disturbances: none Tremor: no tremor Auditory Disturbances: not present Paroxysmal Sweats: no sweat visible Visual Disturbances: not present Anxiety: mildly anxious Headache, Fullness in Head: none present Agitation: normal activity Orientation and Clouding of Sensorium: oriented and can do serial additions CIWA-Ar Total: 1 COWS:    Allergies:  Allergies  Allergen Reactions  . Carrot [Daucus Carota] Anaphylaxis and Rash  . Penicillins Anaphylaxis and Hives    Has patient had a PCN reaction causing immediate rash, facial/tongue/throat swelling, SOB or lightheadedness with hypotension: Yes Has patient had a PCN reaction causing severe rash involving mucus membranes or skin necrosis: No Has patient had a PCN reaction that required hospitalization Yes Has patient had a PCN reaction occurring within the last 10 years: No If all of the above answers are "NO", then may proceed with Cephalosporin use.     Home Medications: (Not in a hospital admission)   OB/GYN Status:  No LMP for male patient.  General Assessment Data [facility] TTS Assessment: In system Is this a Tele or Face-to-Face Assessment?: Face-to-Face Is this an Initial Assessment or a Re-assessment for this encounter?: Initial Assessment Patient Accompanied by:: N/A Language Other than English: No Living Arrangements: Other (Comment)(Private  Home) What gender do you identify as?: Male Marital status: Divorced Pregnancy Status: No Living Arrangements: Alone Can pt return to current living arrangement?: Yes Admission Status: Involuntary Petitioner: ED Attending Is patient capable of signing voluntary admission?: No(Under IVC) Referral Source: Self/Family/Friend Insurance type: Medicare A&B  Medical  Screening Exam ([place]) Medical Exam completed: Yes  Crisis Care Plan Living Arrangements: Alone Legal Guardian: Other:(Self) Name of Psychiatrist: Dr. Gladys Damme - [facility] Name of Therapist: [facility]  Education Status Is patient currently in school?: No Highest grade of school patient has completed: 12th Is the patient employed, unemployed or receiving disability?: Unemployed, Receiving disability income  Risk to self with the past 6 months Suicidal Ideation: Yes-Currently Present Has patient been a risk to self within the past 6 months prior to admission? : Yes Suicidal Intent: No Has patient had any suicidal intent within the past 6 months prior to admission? : No Is patient at risk for suicide?: Yes Suicidal Plan?: Yes-Currently Present Has patient had any suicidal plan within the past 6 months prior to admission? : Yes Specify Current Suicidal Plan: Having command A/H Access to Means: Yes What has been your use of drugs/alcohol within the last 12 months?: Alcohol Previous Attempts/Gestures: Yes How many times?: 1 Other Self Harm Risks: Unknown Triggers for Past Attempts: None known Intentional Self Injurious Behavior: None Family Suicide History: Unknown Recent stressful life event(s): Other (Comment) Persecutory voices/beliefs?: Yes Depression: Yes Depression Symptoms: Insomnia, Tearfulness Substance abuse history and/or treatment for substance abuse?: Yes Suicide prevention information given to non-admitted patients: Not applicable  Risk to Others within the past 6 months Homicidal Ideation: No Does patient have any lifetime risk of violence toward others beyond the six months prior to admission? : No Thoughts of Harm to Others: No Current Homicidal Intent: No Current Homicidal Plan: No Access to Homicidal Means: No Identified Victim: Reports of none  History of harm to others?: No Assessment of Violence: None Noted Violent Behavior  Description: Reports of none Does patient have access to weapons?: No Criminal Charges Pending?: No Does patient have a court date: No Is patient on probation?: No  Psychosis Hallucinations: Auditory, With command Delusions: None noted  Mental Status Report Appearance/Hygiene: Unremarkable, In scrubs Eye Contact: Fair Motor Activity: Freedom of movement, Unremarkable Speech: Logical/coherent, Unremarkable Level of Consciousness: Alert Mood: Depressed, Anxious, Sad, Pleasant Affect: Appropriate to circumstance Anxiety Level: Minimal Thought Processes: Coherent, Relevant Judgement: Partial Orientation: Person, Place, Time, Situation, Appropriate for developmental age Obsessive Compulsive Thoughts/Behaviors: None  Cognitive Functioning Concentration: Decreased Memory: Recent Intact, Remote Intact Is patient IDD: No Insight: Fair Impulse Control: Fair Appetite: Good Have you had any weight changes? : No Change Sleep: No Change Total Hours of Sleep: 7 Vegetative Symptoms: None  ADLScreening [facility] Assessment Services) Patient's cognitive ability adequate to safely complete daily activities?: Yes Patient able to express need for assistance with ADLs?: Yes Independently performs ADLs?: Yes (appropriate for developmental age)  Prior Inpatient Therapy Prior Inpatient Therapy: Yes Prior Therapy Dates: 10/2012; 04/2012; 03/2012; 10/2011; 09/2011 Prior Therapy Facilty/Provider(s): [facility] Reason for Treatment: Pt was unable to recall the reason for past admissions  Prior Outpatient Therapy Prior Outpatient Therapy: Yes Prior Therapy Dates: Current Prior Therapy Facilty/Provider(s): [facility] Reason for Treatment: Peer Support; Medication Management Does patient have an ACCT team?: No Does patient have Intensive In-House Services?  : No Does patient have [facility] services? : No Does patient have P4CC services?: No  ADL Screening (condition at time  of  admission) Patient's cognitive ability adequate to safely complete daily activities?: Yes Is the patient deaf or have difficulty hearing?: No Does the patient have difficulty seeing, even when wearing glasses/contacts?: No Does the patient have difficulty concentrating, remembering, or making decisions?: No Patient able to express need for assistance with ADLs?: Yes Does the patient have difficulty dressing or bathing?: No Independently performs ADLs?: Yes (appropriate for developmental age) Does the patient have difficulty walking or climbing stairs?: No Weakness of Legs: None Weakness of Arms/Hands: None  Home Assistive Devices/Equipment Home Assistive Devices/Equipment: None  Therapy Consults (therapy consults require a physician order) PT Evaluation Needed: No OT Evalulation Needed: No SLP Evaluation Needed: No Abuse/Neglect Assessment (Assessment to be complete while patient is alone) Abuse/Neglect Assessment Can Be Completed: Yes Physical Abuse: Denies Verbal Abuse: Denies Sexual Abuse: Denies Exploitation of patient/patient's resources: Denies Self-Neglect: Denies Values / Beliefs Cultural Requests During Hospitalization: None Spiritual Requests During Hospitalization: None Consults Spiritual Care Consult Needed: No Transition of Care Team Consult Needed: No Advance Directives (For Healthcare) Does Patient Have a Medical Advance Directive?: No Would patient like information on creating a medical advance directive?: No - Patient declined  Child/Adolescent Assessment Running Away Risk: Denies(Patient is an adult)  Disposition:  Disposition Initial Assessment Completed for this Encounter: Yes  On Site Evaluation by:   Reviewed with Physician:    Lilyan Gilford MS, LCAS, [facility], NCC Therapeutic Triage Specialist 10/15/2019 11:39 AM

## 2019-10-15 NOTE — ED Notes (Signed)
Pt given meal tray.

## 2019-10-15 NOTE — ED Notes (Signed)
The patient was dressed out into the require purple scrubs. His belongings are placed into a white patient belonging bag and labeled properly according to [facility]. One black book-bag, gray shirt, white tee shirt, black shoes, gray socks blue jeans, dark gray belt, his pockets are full of things, I rolled up the jeans and left all items in the pockets. I did not remove any items. His belongings were placed into the locked storage closet in the [place]. No issues during this process.

## 2019-10-15 NOTE — BH Assessment (Signed)
Per the instructions of [facility] AD Warner Mccreedy W.), writer referred the patient to other inpatient facilities due to his previous admission with [facility], he was too aggressive and disruptive.   Referral information for Psychiatric Hospitalization faxed to;   [name] [phone]),   . [facility] ([phone]),  . [facility] [phone] or [phone])  . [facility] [phone]),   . [facility]- [phone] -or- [phone]), Declined  . [facility] [phone])

## 2019-10-15 NOTE — BH Assessment (Signed)
Writer spoke with staff at [facility] to follow-up referral, staff requesting vitals and current COVID test before patient can be reviewed. Writer communicated new COVID test needed to EDP.

## 2019-10-15 NOTE — BH Assessment (Signed)
PATIENT BED WILL BE AVAILABLE AFTER 8:00AM  Patient has been accepted to [facility].  Accepting physician is Dr. Jonelle Sports.  Call report to [phone].  Representative was Rod Holler.   ER Staff is aware of it:  [facility] ER Secretary  Dr. Joni Fears, ER MD  Carroll Sage. Patient's Nurse

## 2019-10-15 NOTE — ED Notes (Signed)
Says he doesn't want to live.  Says he wants to find a gun to kill self.  He does not have a gun.  Says  He has been staying at hotel because of covid, but prior to that he stayed "wherever"   When asked about alcohol and drug use says he tries not to.  Says last etoh was 2 days ago.  Denies any history of dts and says he does not have any trembling now.

## 2019-10-15 NOTE — Consult Note (Signed)
[facility] Consult   Reason for Consult: Depression, SI,  paranoia Referring Physician:  Dr. Corky Downs Patient Identification: Cory Floyd MRN:  [id number] Principal Diagnosis: <principal problem not specified> Diagnosis:  Active Problems:   * No active hospital problems. *   Total Time spent with patient: 30 minutes  Subjective:   Cory Floyd is a 43 y.o. male patient who presents with suicidal ideation, paranoia, and auditory hallucinations in the context of medication noncompliance and substance use.   HPI: Patient is a 43 year old male with history of bipolar disorder who presents complaining of suicidal ideation with plan to harm himself.  Patient does not give specific plan states that he will "find a way".  Patient mentions that he will buy a gun.  Patient is also endorsing auditory hallucinations command in origin telling him to hurt himself.  Patient states that these have been present for several days and he was prompted to come to the emergency department today after feeling increasingly suicidal.  Patient denies any recent drug use today or yesterday, but acknowledges substance use approximately 2 days ago.  Patient acknowledges some paranoia and remains guarded with the details.  Past Psychiatric History: Patient has a past psychiatric history of bipolar disorder.  Recently hospitalized inpatient approximately 2 weeks ago.  Risk to Self:  Yes Risk to Others:   No Prior Inpatient Therapy:  Yes Prior Outpatient Therapy:  Yes  Past Medical History:  Past Medical History:  Diagnosis Date  . Alcoholic ([place])    clean for 3 years  . Anginal pain ([place])   . Arthritis   . Bipolar 1 disorder ([place])   . Depressed   . Dyspnea   . Fatty liver   . Schizophrenia [facility])     Past Surgical History:  Procedure Laterality Date  . COLONOSCOPY  02/05/2014   diverticulosis, hyperplastic polyp x 2  . fracture arm Right   . KNEE SURGERY Right 2013  . NASAL SEPTUM  SURGERY    . NASAL TURBINATE REDUCTION Bilateral 12/12/2016   Procedure: TURBINATE REDUCTION/SUBMUCOSAL RESECTION;  Surgeon: Beverly Gust, MD;  Location: [facility] ORS;  Service: ENT;  Laterality: Bilateral;   Family History:  Family History  Problem Relation Age of Onset  . Hepatitis C Mother   . Cancer Mother   . Cancer Maternal Aunt   . Hypertension Maternal Grandmother    Family Psychiatric  History: Endorses family history of mental illness Social History:  Social History   Substance and Sexual Activity  Alcohol Use Yes     Social History   Substance and Sexual Activity  Drug Use No   Comment: history of cocaine per Dr Ileene Hutchinson H&P    Social History   Socioeconomic History  . Marital status: Divorced    Spouse name: Not on file  . Number of children: Not on file  . Years of education: Not on file  . Highest education level: Not on file  Occupational History  . Not on file  Tobacco Use  . Smoking status: Current Every Day Smoker    Packs/day: 1.00    Years: 20.00    Pack years: 20.00    Types: Cigarettes  . Smokeless tobacco: Never Used  Substance and Sexual Activity  . Alcohol use: Yes  . Drug use: No    Comment: history of cocaine per Dr Ileene Hutchinson H&P  . Sexual activity: Yes    Birth control/protection: None  Other Topics Concern  . Not on file  Social  History Narrative  . Not on file   Social Determinants of Health   Financial Resource Strain:   . Difficulty of Paying Living Expenses: Not on file  Food Insecurity:   . Worried About Programme researcher, broadcasting/film/videounning Out of Food in the Last Year: Not on file  . Ran Out of Food in the Last Year: Not on file  Transportation Needs:   . Lack of Transportation (Medical): Not on file  . Lack of Transportation (Non-Medical): Not on file  Physical Activity:   . Days of Exercise per Week: Not on file  . Minutes of Exercise per Session: Not on file  Stress:   . Feeling of Stress : Not on file  Social Connections:   . Frequency of  Communication with Friends and Family: Not on file  . Frequency of Social Gatherings with Friends and Family: Not on file  . Attends Religious Services: Not on file  . Active Member of Clubs or Organizations: Not on file  . Attends BankerClub or Organization Meetings: Not on file  . Marital Status: Not on file   Additional Social History:    Allergies:   Allergies  Allergen Reactions  . Carrot [Daucus Carota] Anaphylaxis and Rash  . Penicillins Anaphylaxis and Hives    Has patient had a PCN reaction causing immediate rash, facial/tongue/throat swelling, SOB or lightheadedness with hypotension: Yes Has patient had a PCN reaction causing severe rash involving mucus membranes or skin necrosis: No Has patient had a PCN reaction that required hospitalization Yes Has patient had a PCN reaction occurring within the last 10 years: No If all of the above answers are "NO", then may proceed with Cephalosporin use.     Labs: No results found for this or any previous visit (from the past 48 hour(s)).  No current facility-administered medications for this encounter.   Current Outpatient Medications  Medication Sig Dispense Refill  . acetaminophen (TYLENOL) 325 MG tablet Take 2 tablets (650 mg total) by mouth every 6 (six) hours as needed for mild pain, moderate pain or fever. 14 tablet 0  . ARIPiprazole (ABILIFY) 20 MG tablet Take 1 tablet (20 mg total) by mouth daily. 7 tablet 0  . ascorbic acid (VITAMIN C) 500 MG tablet Take 1 tablet (500 mg total) by mouth daily. 7 tablet 0  . divalproex (DEPAKOTE ER) 500 MG 24 hr tablet Take 3 tablets (1,500 mg total) by mouth daily with supper. 21 tablet 0  . gabapentin (NEURONTIN) 300 MG capsule Take 1 capsule (300 mg total) by mouth 3 (three) times daily. 21 capsule 0  . ibuprofen (ADVIL) 800 MG tablet Take 1 tablet (800 mg total) by mouth every 8 (eight) hours as needed for moderate pain (back pain). 14 tablet 0  . mirtazapine (REMERON) 15 MG tablet Take 1  tablet (15 mg total) by mouth at bedtime. 7 tablet 0  . zinc sulfate 220 (50 Zn) MG capsule Take 1 capsule (220 mg total) by mouth daily. 7 capsule 0    Musculoskeletal: Strength & Muscle Tone: within normal limits Gait & Station: normal Patient leans: N/A  Psychiatric Specialty Exam: Physical Exam  Review of Systems  Psychiatric/Behavioral: Positive for dysphoric mood, hallucinations, sleep disturbance and suicidal ideas. Negative for self-injury. The patient is nervous/anxious. The patient is not hyperactive.     Blood pressure 126/78, pulse 92, temperature 99 F (37.2 C), temperature source Oral, resp. rate 18, height 5\' 9"  (1.753 m), weight 103.9 kg, SpO2 98 %.Body mass index is 33.82 kg/m.  General Appearance: Disheveled  Eye Contact:  Poor  Speech:  Garbled  Volume:  Decreased  Mood:  Dysphoric  Affect:  Depressed  Thought Process:  Linear  Orientation:  Full (Time, Place, and Person)  Thought Content:  Logical, Hallucinations: Auditory and Paranoid Ideation  Suicidal Thoughts:  Yes.  with intent/plan  Homicidal Thoughts:  No  Memory:  Immediate;   Fair  Judgement:  Poor  Insight:  Lacking  Psychomotor Activity:  Normal  Concentration:  Concentration: Fair  Recall:  [employer] of Knowledge:  Fair  Language:  Fair  Akathisia:  No  Handed:  Right  AIMS (if indicated):     Assets:  Desire for Improvement  ADL's:  Intact  Cognition:  WNL  Sleep:        Treatment Plan Summary: 43 year old male with history of bipolar illness presents hallucinating with mood disturbance and suicidal ideation in the context of medication noncompliance and substance abuse.  Patient will require inpatient hospitalization for safety, stabilization, and medication management.  Diagnosis: Bipolar disorder  Medications: We will restart patient on medications and defer changes to unit provider.   Disposition: Recommend psychiatric Inpatient admission when medically cleared.  Clement Sayres, MD 10/15/2019 10:59 AM

## 2019-10-15 NOTE — ED Notes (Signed)
Pt 11 days out from positive test. Discussed with charge RN and Dr. Corky Downs. Pt cleared from precautions. Needs to remain wearing a mask at all times. Will wait in quad until BMU bed assigned. Does not need to be retested per protocol. Will consult with ID on call.
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
## 2019-10-15 NOTE — ED Notes (Signed)
Pt. Introduced to unit, pt. Stated "I have been here before".  Pt. Reminded of safety checks, bathroom usage.  Pt. Stated he was hungry, pt. Given meal tray, drink and tv remote.  Pt. Calm and cooperative at this time.

## 2019-10-16 LAB — SARS CORONAVIRUS 2 (TAT 6-24 HRS): SARS Coronavirus 2: NEGATIVE

## 2019-10-16 NOTE — ED Notes (Signed)
Snack and beverage given. 

## 2019-10-16 NOTE — ED Notes (Signed)
Patient up to bathroom, no signs of distress, q 15 minute checks and camera surveillance in progress for safety.

## 2019-10-16 NOTE — ED Notes (Signed)
Pt. Requested and was given cup of OJ. 

## 2019-10-16 NOTE — BH Assessment (Signed)
Writer spoke with [facility]), informed them the patient will not transport to them today (10/16/2019) due to no transportation. Was advised to call back tomorrow morning (10/17/2019) and update them about transportation.

## 2019-10-16 NOTE — ED Notes (Signed)
Hourly rounding reveals patient in room. No complaints, stable, in no acute distress. Q15 minute rounds and monitoring via Security Cameras to continue. 

## 2019-10-16 NOTE — ED Notes (Signed)
Patient is alert and oriented, ate 100% of breakfast and beverage, no signs of distress, Patient took po medications also, Nurse will continue to monitor.

## 2019-10-16 NOTE — ED Provider Notes (Signed)
-----------------------------------------   6:01 AM on 10/16/2019 -----------------------------------------   Blood pressure 115/62, pulse 88, temperature 98.9 F (37.2 C), temperature source Oral, resp. rate 17, height 5\' 9"  (1.753 m), weight 103.9 kg, SpO2 99 %.  The patient is sleeping at this time.  There have been no acute events since the last update.  Awaiting disposition plan from Behavioral Medicine and/or Social Work team(s).   Paulette Blanch, MD 10/16/19 [phone]

## 2019-10-16 NOTE — ED Notes (Signed)
Patient ate 100% of supper and beverage. No signs of distress.  

## 2019-10-16 NOTE — ED Notes (Signed)
Pt. Requested and was given meal tray and drink. 

## 2019-10-17 NOTE — ED Notes (Signed)
Hourly rounding reveals patient sleeping in room. No complaints, stable, in no acute distress. Q15 minute rounds and monitoring via Security Cameras to continue. 

## 2019-10-17 NOTE — ED Notes (Signed)
Patient in bed sleeping all morning. Awakened for vital signs and meals.

## 2019-10-17 NOTE — ED Notes (Signed)
Hourly rounding reveals patient in room. No complaints, stable, in no acute distress. Q15 minute rounds and monitoring via Security Cameras to continue. 

## 2019-10-17 NOTE — ED Notes (Signed)
Hourly rounding reveals patient awake in room. No complaints, stable, in no acute distress. Q15 minute rounds and monitoring via Security Cameras to continue. 

## 2019-10-17 NOTE — ED Notes (Signed)
Pt resting in bed.

## 2019-10-17 NOTE — ED Notes (Signed)
Report to include Situation, Background, Assessment, and Recommendations received from [facility]. Patient alert and oriented, warm and dry, in no acute distress. Patient denies SI, VH and pain. Patient states he has HI toward "someone" and hears voices "talking" without command. Patient made aware of Q15 minute rounds and security cameras for their safety. Patient instructed to come to me with needs or concerns.

## 2019-10-17 NOTE — ED Notes (Signed)
Assumed care of patient. Patient sleeping well. Will monitor upon awakening.

## 2019-10-17 NOTE — ED Notes (Signed)
IVC/ACCEPTED TO [facility].

## 2019-10-17 NOTE — ED Notes (Signed)
IVC accepted to [facility] pending transport available

## 2019-10-18 DIAGNOSIS — F339 Major depressive disorder, recurrent, unspecified: Secondary | ICD-10-CM | POA: Diagnosis not present

## 2019-10-18 DIAGNOSIS — F3130 Bipolar disorder, current episode depressed, mild or moderate severity, unspecified: Secondary | ICD-10-CM | POA: Diagnosis not present

## 2019-10-18 DIAGNOSIS — F3164 Bipolar disorder, current episode mixed, severe, with psychotic features: Secondary | ICD-10-CM

## 2019-10-18 DIAGNOSIS — F600 Paranoid personality disorder: Secondary | ICD-10-CM | POA: Diagnosis not present

## 2019-10-18 DIAGNOSIS — R45851 Suicidal ideations: Secondary | ICD-10-CM | POA: Diagnosis not present

## 2019-10-18 NOTE — ED Notes (Signed)
Hourly rounding reveals patient sleeping in room. No complaints, stable, in no acute distress. Q15 minute rounds and monitoring via Security Cameras to continue. 

## 2019-10-18 NOTE — ED Notes (Signed)
DINER TRAY PROVIDED.

## 2019-10-18 NOTE — ED Notes (Signed)
Hourly rounding reveals patient in room. No complaints, stable, in no acute distress. Q15 minute rounds and monitoring via Security Cameras to continue. 

## 2019-10-18 NOTE — ED Notes (Signed)
Snack and beverage given. 

## 2019-10-18 NOTE — ED Notes (Signed)
Patient in room does not get out of bed unless he has to use bathroom. Up to bathroom 2 times today.

## 2019-10-18 NOTE — ED Notes (Signed)
IVC bed at [facility] was given away, pending placement

## 2019-10-18 NOTE — Progress Notes (Signed)
Patient meets criteria for inpatient treatment per Marvia Pickles, FNP. No appropriate beds at [facility] currently. Bed at [facility] no longer available. CSW faxed referrals to the following facilities for review:  [facility]   [facility]   [facility]   TTS will continue to seek bed placement.     Darletta Moll MSW, [place] Worker Disposition  [facility] Ph: [phone] Fax: [phone] 10/18/2019 10:14 AM

## 2019-10-18 NOTE — ED Notes (Signed)
Report to include Situation, Background, Assessment, and Recommendations received from [facility]. Patient alert and oriented, warm and dry, in no acute distress. Patient denies VH and pain. Patient states he has SI without plan and HI but declines to identify an individual and AH without command. Patient made aware of Q15 minute rounds and security cameras for their safety. Patient instructed to come to me with needs or concerns.

## 2019-10-18 NOTE — ED Notes (Signed)
Per psych patient bed not available so transport cancelled

## 2019-10-18 NOTE — ED Notes (Signed)
Assumed care of patient, patient sleeping comfortably. Awaiting plan of care today. Safety maintained. Patient denies Si/HI/HV.

## 2019-10-18 NOTE — Consult Note (Signed)
[facility] Face-to[facility]Face [facility] Consult   Reason for Consult: Depression, SI,  paranoia Referring Physician:  Dr. Cyril LoosenKinner Patient Identification: Cory Floyd MRN:  [id number] Principal Diagnosis: Bipolar affective disorder, mixed, severe, with psychotic behavior (HCC) Diagnosis:  Principal Problem:   Bipolar affective disorder, mixed, severe, with psychotic behavior (HCC)   Total Time spent with patient: 30 minutes  Subjective:   Cory Floyd is a 43 y.o. male reports today that he still having difficulty with sleeping at night.  At first he states that he is still suicidal and homicidal minute and states that he does not want to discuss that right now.  He reports that he is having difficulty with getting his life started again because he used to depend on his mother but she passed away 2 years ago.  He states that he has never had to restart his life without some assistance.  He states he is interested in going to another facility or to a facility such [facility].  However he has continued to endorse suicidal and homicidal ideations without specifics today.  Patient does report that he gets social security disability funds each month.  Patient reports that he has nowhere to live and no transportation at this time.  10/15/2019 Psych Consult: Patient who presented with suicidal ideation, paranoia, and auditory hallucinations in the context of medication noncompliance and substance use.   HPI: Patient is a 43 year old male with history of bipolar disorder who presents complaining of suicidal ideation with plan to harm himself.  Patient does not give specific plan states that he will "find a way".  Patient mentions that he will buy a gun.  Patient is also endorsing auditory hallucinations command in origin telling him to hurt himself.  Patient states that these have been present for several days and he was prompted to come to the emergency department today after feeling increasingly  suicidal.  Patient denies any recent drug use today or yesterday, but acknowledges substance use approximately 2 days ago.  Patient acknowledges some paranoia and remains guarded with the details.  Past Psychiatric History: Patient has a past psychiatric history of bipolar disorder.  Recently hospitalized inpatient approximately 2 weeks ago.  Risk to Self: Suicidal Ideation: Yes-Currently Present Suicidal Intent: No Is patient at risk for suicide?: Yes Suicidal Plan?: Yes-Currently Present Specify Current Suicidal Plan: Having command A/H Access to Means: Yes What has been your use of drugs/alcohol within the last 12 months?: Alcohol How many times?: 1 Other Self Harm Risks: Unknown Triggers for Past Attempts: None known Intentional Self Injurious Behavior: NoneYes Risk to Others: Homicidal Ideation: No Thoughts of Harm to Others: No Current Homicidal Intent: No Current Homicidal Plan: No Access to Homicidal Means: No Identified Victim: Reports of none  History of harm to others?: No Assessment of Violence: None Noted Violent Behavior Description: Reports of none Does patient have access to weapons?: No Criminal Charges Pending?: No Does patient have a court date: No No Prior Inpatient Therapy: Prior Inpatient Therapy: Yes Prior Therapy Dates: 10/2012; 04/2012; 03/2012; 10/2011; 09/2011 Prior Therapy Facilty/Provider(s): [facility] Reason for Treatment: Pt was unable to recall the reason for past admissionsYes Prior Outpatient Therapy: Prior Outpatient Therapy: Yes Prior Therapy Dates: Current Prior Therapy Facilty/Provider(s): [facility] Reason for Treatment: Peer Support; Medication Management Does patient have an ACCT team?: No Does patient have Intensive In-House Services?  : No Does patient have [facility] services? : No Does patient have P4CC services?: NoYes  Past Medical History:  Past Medical History:  Diagnosis Date  . 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  Anginal pain ([place])   . Arthritis   . Bipolar 1 disorder ([place])   . Depressed   . Dyspnea   . Fatty liver   . Schizophrenia [facility])     Past Surgical History:  Procedure Laterality Date  . COLONOSCOPY  02/05/2014   diverticulosis, hyperplastic polyp x 2  . fracture arm Right   . KNEE SURGERY Right 2013  . NASAL SEPTUM SURGERY    . NASAL TURBINATE REDUCTION Bilateral 12/12/2016   Procedure: TURBINATE REDUCTION/SUBMUCOSAL RESECTION;  Surgeon: Beverly Gust, MD;  Location: [facility] ORS;  Service: ENT;  Laterality: Bilateral;   Family History:  Family History  Problem Relation Age of Onset  . Hepatitis C Mother   . Cancer Mother   . Cancer Maternal Aunt   . Hypertension Maternal Grandmother    Family Psychiatric  History: Endorses family history of mental illness Social History:  Social History   Substance and Sexual Activity  Alcohol Use Yes     Social History   Substance and Sexual Activity  Drug Use No   Comment: history of cocaine per Dr Ileene Hutchinson H&P    Social History   Socioeconomic History  . Marital status: Divorced    Spouse name: Not on file  . Number of children: Not on file  . Years of education: Not on file  . Highest education level: Not on file  Occupational History  . Not on file  Tobacco Use  . Smoking status: Current Every Day Smoker    Packs/day: 1.00    Years: 20.00    Pack years: 20.00    Types: Cigarettes  . Smokeless tobacco: Never Used  Substance and Sexual Activity  . Alcohol use: Yes  . Drug use: No    Comment: history of cocaine per Dr Ileene Hutchinson H&P  . Sexual activity: Yes    Birth control/protection: None  Other Topics Concern  . Not on file  Social History Narrative  . Not on file   Social Determinants of Health   Financial Resource Strain:   . Difficulty of Paying Living Expenses: Not on file  Food Insecurity:   . Worried About Charity fundraiser in the Last Year: Not on file  . Ran Out of Food in the Last Year: Not on file   Transportation Needs:   . Lack of Transportation (Medical): Not on file  . Lack of Transportation (Non-Medical): Not on file  Physical Activity:   . Days of Exercise per Week: Not on file  . Minutes of Exercise per Session: Not on file  Stress:   . Feeling of Stress : Not on file  Social Connections:   . Frequency of Communication with Friends and Family: Not on file  . Frequency of Social Gatherings with Friends and Family: Not on file  . Attends Religious Services: Not on file  . Active Member of Clubs or Organizations: Not on file  . Attends Archivist Meetings: Not on file  . Marital Status: Not on file   Additional Social History:    Allergies:   Allergies  Allergen Reactions  . Carrot [Daucus Carota] Anaphylaxis and Rash  . Penicillins Anaphylaxis and Hives    Has patient had a PCN reaction causing immediate rash, facial/tongue/throat swelling, SOB or lightheadedness with hypotension: Yes Has patient had a PCN reaction causing severe rash involving mucus membranes or skin necrosis:  No Has patient had a PCN reaction that required hospitalization Yes Has patient had a PCN reaction occurring within the last 10 years: No If all of the above answers are "NO", then may proceed with Cephalosporin use.     Labs: No results found for this or any previous visit (from the past 48 hour(s)).  Current Facility-Administered Medications  Medication Dose Route Frequency Provider Last Rate Last Admin  . ARIPiprazole (ABILIFY) tablet 10 mg  10 mg Oral Daily Cristofano, Worthy Rancher, MD   10 mg at 10/17/19 1035  . divalproex (DEPAKOTE) DR tablet 250 mg  250 mg Oral Q12H Cristofano, Worthy Rancher, MD   250 mg at 10/17/19 2105  . gabapentin (NEURONTIN) capsule 300 mg  300 mg Oral TID Clement Sayres, MD   300 mg at 10/17/19 2105   Current Outpatient Medications  Medication Sig Dispense Refill  . acetaminophen (TYLENOL) 325 MG tablet Take 2 tablets (650 mg total) by mouth every 6 (six)  hours as needed for mild pain, moderate pain or fever. 14 tablet 0  . ARIPiprazole (ABILIFY) 20 MG tablet Take 1 tablet (20 mg total) by mouth daily. 7 tablet 0  . ascorbic acid (VITAMIN C) 500 MG tablet Take 1 tablet (500 mg total) by mouth daily. 7 tablet 0  . divalproex (DEPAKOTE ER) 500 MG 24 hr tablet Take 3 tablets (1,500 mg total) by mouth daily with supper. 21 tablet 0  . gabapentin (NEURONTIN) 300 MG capsule Take 1 capsule (300 mg total) by mouth 3 (three) times daily. 21 capsule 0  . ibuprofen (ADVIL) 800 MG tablet Take 1 tablet (800 mg total) by mouth every 8 (eight) hours as needed for moderate pain (back pain). 14 tablet 0  . mirtazapine (REMERON) 15 MG tablet Take 1 tablet (15 mg total) by mouth at bedtime. 7 tablet 0  . zinc sulfate 220 (50 Zn) MG capsule Take 1 capsule (220 mg total) by mouth daily. 7 capsule 0    Musculoskeletal: Strength & Muscle Tone: within normal limits Gait & Station: normal Patient leans: N/A  Psychiatric Specialty Exam: Physical Exam  Nursing note and vitals reviewed. Constitutional: He is oriented to person, place, and time. He appears well-developed and well-nourished.  Cardiovascular: Normal rate.  Respiratory: Effort normal.  Musculoskeletal:        General: Normal range of motion.  Neurological: He is alert and oriented to person, place, and time.  Skin: Skin is warm.    Review of Systems  Constitutional: Negative.   HENT: Negative.   Eyes: Negative.   Respiratory: Negative.   Cardiovascular: Negative.   Gastrointestinal: Negative.   Genitourinary: Negative.   Musculoskeletal: Negative.   Skin: Negative.   Neurological: Negative.   Psychiatric/Behavioral: Positive for dysphoric mood, sleep disturbance and suicidal ideas. Negative for self-injury. The patient is not hyperactive.     Blood pressure 130/84, pulse 78, temperature 98.6 F (37 C), temperature source Oral, resp. rate 18, height 5\' 9"  (1.753 m), weight 103.9 kg, SpO2 99  %.Body mass index is 33.82 kg/m.  General Appearance: Disheveled  Eye Contact:  Poor  Speech:  Clear and Coherent  Volume:  Decreased  Mood:  Dysphoric  Affect:  Depressed  Thought Process:  Linear  Orientation:  Full (Time, Place, and Person)  Thought Content:  Logical  Suicidal Thoughts:  Only reports SI with no future plans  Homicidal Thoughts:  No  Memory:  Immediate;   Fair Recent;   Fair Remote;   Fair  Judgement:  Fair  Insight:  Fair  Psychomotor Activity:  Normal  Concentration:  Concentration: Fair  Recall:  [employer] of Knowledge:  Fair  Language:  Fair  Akathisia:  No  Handed:  Right  AIMS (if indicated):     Assets:  Desire for Improvement  ADL's:  Intact  Cognition:  WNL  Sleep:        Treatment Plan Summary: 43 year old male with history of bipolar illness was [facility] but then was given away.  The patient is now waiting for further placement.  Was plan to attempt to discharge the patient to [facility] rescue mission but currently could not get patient to [facility] rescue [facility] and patient continued to endorse suicidal ideations.  Diagnosis: Bipolar disorder  Medications:  Continue Abilify 10 mg p.o. daily Continue Depakote 250 mg p.o. every 12 hours Continue Neurontin 300 mg p.o. 3 times daily  Disposition: Recommend psychiatric Inpatient admission when medically cleared.  Gerlene Burdock Audrea Bolte, FNP 10/18/2019 9:54 AM

## 2019-10-18 NOTE — ED Provider Notes (Signed)
-----------------------------------------   2:23 AM on 10/18/2019 -----------------------------------------   Blood pressure (!) 127/59, pulse 89, temperature 97.8 F (36.6 C), temperature source Oral, resp. rate 16, height 5\' 9"  (1.753 m), weight 103.9 kg, SpO2 98 %.  The patient is calm and cooperative at this time.  There have been no acute events since the last update.  Awaiting disposition plan from Behavioral Medicine and/or Social Work team(s).    Merlyn Lot, MD 10/18/19 [phone]

## 2019-10-18 NOTE — ED Notes (Signed)
Hourly rounding reveals patient awake in room. No complaints, stable, in no acute distress. Q15 minute rounds and monitoring via Security Cameras to continue. 

## 2019-10-18 NOTE — ED Notes (Signed)
Lunch provided.

## 2019-10-19 ENCOUNTER — Encounter: Payer: Self-pay | Admitting: Psychiatry

## 2019-10-19 ENCOUNTER — Other Ambulatory Visit: Payer: Self-pay

## 2019-10-19 ENCOUNTER — Inpatient Hospital Stay: Admission: EM | Admit: 2019-10-19 | Payer: Medicare Other | Source: Intra-hospital | Admitting: Internal Medicine

## 2019-10-19 ENCOUNTER — Inpatient Hospital Stay
Admission: EM | Admit: 2019-10-19 | Discharge: 2019-10-28 | DRG: 885 | Disposition: A | Discharge status: Home / Self Care | Payer: Medicare Other | Source: Intra-hospital | Attending: Psychiatry | Admitting: Psychiatry

## 2019-10-19 DIAGNOSIS — Z880 Allergy status to penicillin: Secondary | ICD-10-CM

## 2019-10-19 DIAGNOSIS — M1990 Unspecified osteoarthritis, unspecified site: Secondary | ICD-10-CM | POA: Diagnosis present

## 2019-10-19 DIAGNOSIS — R42 Dizziness and giddiness: Secondary | ICD-10-CM | POA: Diagnosis not present

## 2019-10-19 DIAGNOSIS — Z818 Family history of other mental and behavioral disorders: Secondary | ICD-10-CM

## 2019-10-19 DIAGNOSIS — F319 Bipolar disorder, unspecified: Secondary | ICD-10-CM | POA: Diagnosis present

## 2019-10-19 DIAGNOSIS — Z915 Personal history of self-harm: Secondary | ICD-10-CM | POA: Diagnosis not present

## 2019-10-19 DIAGNOSIS — Z9114 Patient's other noncompliance with medication regimen: Secondary | ICD-10-CM | POA: Diagnosis not present

## 2019-10-19 DIAGNOSIS — R45850 Homicidal ideations: Secondary | ICD-10-CM | POA: Diagnosis present

## 2019-10-19 DIAGNOSIS — F19959 Other psychoactive substance use, unspecified with psychoactive substance-induced psychotic disorder, unspecified: Secondary | ICD-10-CM

## 2019-10-19 DIAGNOSIS — F600 Paranoid personality disorder: Secondary | ICD-10-CM

## 2019-10-19 DIAGNOSIS — K219 Gastro-esophageal reflux disease without esophagitis: Secondary | ICD-10-CM | POA: Diagnosis present

## 2019-10-19 DIAGNOSIS — Z79899 Other long term (current) drug therapy: Secondary | ICD-10-CM

## 2019-10-19 DIAGNOSIS — F17210 Nicotine dependence, cigarettes, uncomplicated: Secondary | ICD-10-CM | POA: Diagnosis present

## 2019-10-19 DIAGNOSIS — F1410 Cocaine abuse, uncomplicated: Secondary | ICD-10-CM | POA: Diagnosis not present

## 2019-10-19 DIAGNOSIS — F209 Schizophrenia, unspecified: Secondary | ICD-10-CM | POA: Diagnosis present

## 2019-10-19 DIAGNOSIS — F339 Major depressive disorder, recurrent, unspecified: Secondary | ICD-10-CM

## 2019-10-19 DIAGNOSIS — F1420 Cocaine dependence, uncomplicated: Secondary | ICD-10-CM | POA: Diagnosis present

## 2019-10-19 DIAGNOSIS — R7309 Other abnormal glucose: Secondary | ICD-10-CM | POA: Diagnosis not present

## 2019-10-19 DIAGNOSIS — R45851 Suicidal ideations: Secondary | ICD-10-CM

## 2019-10-19 DIAGNOSIS — K760 Fatty (change of) liver, not elsewhere classified: Secondary | ICD-10-CM | POA: Diagnosis present

## 2019-10-19 DIAGNOSIS — Z590 Homelessness: Secondary | ICD-10-CM | POA: Diagnosis not present

## 2019-10-19 DIAGNOSIS — F14150 Cocaine abuse with cocaine-induced psychotic disorder with delusions: Secondary | ICD-10-CM

## 2019-10-19 DIAGNOSIS — F3130 Bipolar disorder, current episode depressed, mild or moderate severity, unspecified: Secondary | ICD-10-CM | POA: Diagnosis not present

## 2019-10-19 LAB — RESPIRATORY PANEL BY RT PCR (FLU A&B, COVID)
Influenza A by PCR: NEGATIVE
Influenza B by PCR: NEGATIVE
SARS Coronavirus 2 by RT PCR: NEGATIVE

## 2019-10-19 MED ORDER — TRAZODONE HCL 50 MG PO TABS
50.0000 mg | ORAL_TABLET | Freq: Every evening | ORAL | Status: DC | PRN
  Administered 2019-10-19 – 2019-10-27 (×7): 50 mg via ORAL
  Filled 2019-10-19 (×8): qty 1

## 2019-10-19 MED ORDER — HYDROXYZINE HCL 25 MG PO TABS
25.0000 mg | ORAL_TABLET | Freq: Three times a day (TID) | ORAL | Status: DC | PRN
  Administered 2019-10-19 – 2019-10-23 (×5): 25 mg via ORAL
  Filled 2019-10-19 (×5): qty 1

## 2019-10-19 MED ORDER — ARIPIPRAZOLE 10 MG PO TABS
10.0000 mg | ORAL_TABLET | Freq: Every day | ORAL | Status: DC
  Administered 2019-10-20: 08:00:00 10 mg via ORAL
  Filled 2019-10-19: qty 1

## 2019-10-19 MED ORDER — MAGNESIUM HYDROXIDE 400 MG/5ML PO SUSP: 30.0000 mL | Freq: Every day | ORAL | Status: DC | PRN

## 2019-10-19 MED ORDER — ALUM & MAG HYDROXIDE-SIMETH 200-200-20 MG/5ML PO SUSP: 30.0000 mL | ORAL | Status: DC | PRN

## 2019-10-19 MED ORDER — ACETAMINOPHEN 325 MG PO TABS
650.0000 mg | ORAL_TABLET | Freq: Four times a day (QID) | ORAL | Status: DC | PRN
  Administered 2019-10-19 – 2019-10-25 (×4): 650 mg via ORAL
  Filled 2019-10-19 (×4): qty 2

## 2019-10-19 MED ORDER — DIVALPROEX SODIUM 250 MG PO DR TAB
250.0000 mg | DELAYED_RELEASE_TABLET | Freq: Two times a day (BID) | ORAL | Status: DC
  Administered 2019-10-19 – 2019-10-20 (×3): 250 mg via ORAL
  Filled 2019-10-19 (×3): qty 1

## 2019-10-19 MED ORDER — GABAPENTIN 600 MG PO TABS
300.0000 mg | ORAL_TABLET | Freq: Three times a day (TID) | ORAL | Status: DC
  Administered 2019-10-19 – 2019-10-28 (×26): 300 mg via ORAL
  Filled 2019-10-19 (×26): qty 1

## 2019-10-19 NOTE — [facility] Counselor (Signed)
Patient accepted to [facility] Room 323 to Dr. Weber Cooks

## 2019-10-19 NOTE — Plan of Care (Signed)
New admission.  Problem: Education: Goal: Ability to state activities that reduce stress will improve Outcome: Not Progressing   Problem: Coping: Goal: Ability to identify and develop effective coping behavior will improve Outcome: Not Progressing   Problem: Self-Concept: Goal: Level of anxiety will decrease Outcome: Not Progressing Goal: Ability to modify response to factors that promote anxiety will improve Outcome: Not Progressing   Problem: Education: Goal: Utilization of techniques to improve thought processes will improve Outcome: Not Progressing Goal: Knowledge of the prescribed therapeutic regimen will improve Outcome: Not Progressing   Problem: Activity: Goal: Interest or engagement in leisure activities will improve Outcome: Not Progressing Goal: Imbalance in normal sleep/wake cycle will improve Outcome: Not Progressing   Problem: Coping: Goal: Will verbalize feelings Outcome: Not Progressing   Problem: Role Relationship: Goal: Will demonstrate positive changes in social behaviors and relationships Outcome: Not Progressing   Problem: Coping: Goal: Coping ability will improve Outcome: Not Progressing   Problem: Medication: Goal: Compliance with prescribed medication regimen will improve Outcome: Not Progressing   Problem: Self-Concept: Goal: Ability to disclose and discuss suicidal ideas will improve Outcome: Not Progressing Goal: Will verbalize positive feelings about self Outcome: Not Progressing

## 2019-10-19 NOTE — ED Notes (Signed)
Meal tray given 

## 2019-10-19 NOTE — Tx Team (Signed)
Initial Treatment Plan 10/19/2019 2:06 PM Cory Floyd [id number]    PATIENT STRESSORS: Financial difficulties Medication change or noncompliance Substance abuse   PATIENT STRENGTHS: Capable of independent living General fund of knowledge Physical Health   PATIENT IDENTIFIED PROBLEMS: Suicidal / homicidal ideations   Depression   Anxiety                  DISCHARGE CRITERIA:  Ability to meet basic life and health needs Adequate post-discharge living arrangements Improved stabilization in mood, thinking, and/or behavior Reduction of life-threatening or endangering symptoms to within safe limits Verbal commitment to aftercare and medication compliance  PRELIMINARY DISCHARGE PLAN: Outpatient therapy Placement in alternative living arrangements  PATIENT/FAMILY INVOLVEMENT: This treatment plan has been presented to and reviewed with the patient, Cory Floyd.  The patient and family have been given the opportunity to ask questions and make suggestions.  Kieth Brightly, RN 10/19/2019, 2:06 PM

## 2019-10-19 NOTE — ED Provider Notes (Signed)
-----------------------------------------   7:26 AM on 10/19/2019 -----------------------------------------   Blood pressure 128/72, pulse 72, temperature 98.1 F (36.7 C), temperature source Oral, resp. rate 16, height 5\' 9"  (1.753 m), weight 103.9 kg, SpO2 99 %.  The patient is calm and cooperative at this time.  There have been no acute events since the last update.  Awaiting disposition plan from Behavioral Medicine and/or Social Work team(s).    Duffy Bruce, MD 10/19/19 [phone]

## 2019-10-19 NOTE — ED Notes (Signed)
Hourly rounding reveals patient sleeping in room. No complaints, stable, in no acute distress. Q15 minute rounds and monitoring via Security Cameras to continue. 

## 2019-10-19 NOTE — Consult Note (Signed)
[facility] Face-to-Face Psychiatry Consult   Reason for Consult: Depression, SI,  paranoia Referring Physician:  Dr. Cyril Loosen Patient Identification: Cory Floyd MRN:  [id number] Principal Diagnosis: <principal problem not specified> Diagnosis:  Active Problems:   Bipolar affective (HCC)   Total Time spent with patient: 30 minutes  Subjective:   Cory Floyd is a 43 y.o. male patient continues to endorse some suicidal ideations.  Patient states that he still has nothing to live for and that he really needs to have some help.  Patient states that he is okay with being admitted because he knows that he needs to help.  Patient will be transferred to the BMU today's at [facility] for treatment.    10/18/2019: Patient reports today that he still having difficulty with sleeping at night.  At first he states that he is still suicidal and homicidal minute and states that he does not want to discuss that right now.  He reports that he is having difficulty with getting his life started again because he used to depend on his mother but she passed away 2 years ago.  He states that he has never had to restart his life without some assistance.  He states he is interested in going to another facility or to a facility such as the [facility].  However he has continued to endorse suicidal and homicidal ideations without specifics today.  Patient does report that he gets social security disability funds each month.  Patient reports that he has nowhere to live and no transportation at this time.  10/15/2019 Psych Consult: Patient who presented with suicidal ideation, paranoia, and auditory hallucinations in the context of medication noncompliance and substance use.   HPI: Patient is a 43 year old male with history of bipolar disorder who presents complaining of suicidal ideation with plan to harm himself.  Patient does not give specific plan states that he will "find a way".  Patient mentions that he will buy a  gun.  Patient is also endorsing auditory hallucinations command in origin telling him to hurt himself.  Patient states that these have been present for several days and he was prompted to come to the emergency department today after feeling increasingly suicidal.  Patient denies any recent drug use today or yesterday, but acknowledges substance use approximately 2 days ago.  Patient acknowledges some paranoia and remains guarded with the details.  Past Psychiatric History: Patient has a past psychiatric history of bipolar disorder.  Recently hospitalized inpatient approximately 2 weeks ago.  Risk to Self:  Yes Risk to Others:   No Prior Inpatient Therapy:  Yes Prior Outpatient Therapy:  Yes  Past Medical History:  Past Medical History:  Diagnosis Date  . 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 Anginal pain (HCC)   . Arthritis   . Bipolar 1 disorder (HCC)   . Depressed   . Dyspnea   . Fatty liver   . Schizophrenia [facility])     Past Surgical History:  Procedure Laterality Date  . COLONOSCOPY  02/05/2014   diverticulosis, hyperplastic polyp x 2  . fracture arm Right   . KNEE SURGERY Right 2013  . NASAL SEPTUM SURGERY    . NASAL TURBINATE REDUCTION Bilateral 12/12/2016   Procedure: TURBINATE REDUCTION/SUBMUCOSAL RESECTION;  Surgeon: Linus Salmons, MD;  Location: [facility] ORS;  Service: ENT;  Laterality: Bilateral;   Family History:  Family History  Problem Relation Age of Onset  . Hepatitis C Mother   . Cancer  Mother   . Cancer Maternal Aunt   . Hypertension Maternal Grandmother    Family Psychiatric  History: Endorses family history of mental illness Social History:  Social History   Substance and Sexual Activity  Alcohol Use Yes     Social History   Substance and Sexual Activity  Drug Use No   Comment: history of cocaine per Dr Ileene Hutchinson H&P    Social History   Socioeconomic History  . Marital status: Divorced    Spouse name: Not on file  . Number of children: Not on  file  . Years of education: Not on file  . Highest education level: Not on file  Occupational History  . Not on file  Tobacco Use  . Smoking status: Current Every Day Smoker    Packs/day: 1.00    Years: 20.00    Pack years: 20.00    Types: Cigarettes  . Smokeless tobacco: Never Used  Substance and Sexual Activity  . Alcohol use: Yes  . Drug use: No    Comment: history of cocaine per Dr Ileene Hutchinson H&P  . Sexual activity: Yes    Birth control/protection: None  Other Topics Concern  . Not on file  Social History Narrative  . Not on file   Social Determinants of Health   Financial Resource Strain:   . Difficulty of Paying Living Expenses: Not on file  Food Insecurity:   . Worried About Charity fundraiser in the Last Year: Not on file  . Ran Out of Food in the Last Year: Not on file  Transportation Needs:   . Lack of Transportation (Medical): Not on file  . Lack of Transportation (Non-Medical): Not on file  Physical Activity:   . Days of Exercise per Week: Not on file  . Minutes of Exercise per Session: Not on file  Stress:   . Feeling of Stress : Not on file  Social Connections:   . Frequency of Communication with Friends and Family: Not on file  . Frequency of Social Gatherings with Friends and Family: Not on file  . Attends Religious Services: Not on file  . Active Member of Clubs or Organizations: Not on file  . Attends Archivist Meetings: Not on file  . Marital Status: Not on file   Additional Social History:    Allergies:   Allergies  Allergen Reactions  . Carrot [Daucus Carota] Anaphylaxis and Rash  . Penicillins Anaphylaxis and Hives    Has patient had a PCN reaction causing immediate rash, facial/tongue/throat swelling, SOB or lightheadedness with hypotension: Yes Has patient had a PCN reaction causing severe rash involving mucus membranes or skin necrosis: No Has patient had a PCN reaction that required hospitalization Yes Has patient had a PCN  reaction occurring within the last 10 years: No If all of the above answers are "NO", then may proceed with Cephalosporin use.     Labs:  Results for orders placed or performed during the hospital encounter of 10/15/19 (from the past 48 hour(s))  Respiratory Panel by RT PCR (Flu A&B, Covid) - Nasopharyngeal Swab     Status: None   Collection Time: 10/19/19 10:11 AM   Specimen: Nasopharyngeal Swab  Result Value Ref Range   SARS Coronavirus 2 by RT PCR NEGATIVE NEGATIVE    Comment: (NOTE) SARS-CoV-2 target nucleic acids are NOT DETECTED. The SARS-CoV-2 RNA is generally detectable in upper respiratoy specimens during the acute phase of infection. The lowest concentration of SARS-CoV-2 viral copies this assay  can detect is 131 copies/mL. A negative result does not preclude SARS-Cov-2 infection and should not be used as the sole basis for treatment or other patient management decisions. A negative result may occur with  improper specimen collection/handling, submission of specimen other than nasopharyngeal swab, presence of viral mutation(s) within the areas targeted by this assay, and inadequate number of viral copies (<131 copies/mL). A negative result must be combined with clinical observations, patient history, and epidemiological information. The expected result is Negative. Fact Sheet for Patients:  https://www.moore.com/ Fact Sheet for Healthcare Providers:  https://www.young.biz/ This test is not yet ap proved or cleared by the [place] FDA and  has been authorized for detection and/or diagnosis of SARS-CoV-2 by FDA under an Emergency Use Authorization (EUA). This EUA will remain  in effect (meaning this test can be used) for the duration of the COVID-19 declaration under Section 564(b)(1) of the Act, 21 U.S.C. section 360bbb-3(b)(1), unless the authorization is terminated or revoked sooner.    Influenza A by PCR NEGATIVE NEGATIVE    Influenza B by PCR NEGATIVE NEGATIVE    Comment: (NOTE) The Xpert Xpress SARS-CoV-2/FLU/RSV assay is intended as an aid in  the diagnosis of influenza from Nasopharyngeal swab specimens and  should not be used as a sole basis for treatment. Nasal washings and  aspirates are unacceptable for Xpert Xpress SARS-CoV-2/FLU/RSV  testing. Fact Sheet for Patients: https://www.moore.com/ Fact Sheet for Healthcare Providers: https://www.young.biz/ This test is not yet approved or cleared by the [place] FDA and  has been authorized for detection and/or diagnosis of SARS-CoV-2 by  FDA under an Emergency Use Authorization (EUA). This EUA will remain  in effect (meaning this test can be used) for the duration of the  Covid-19 declaration under Section 564(b)(1) of the Act, 21  U.S.C. section 360bbb-3(b)(1), unless the authorization is  terminated or revoked. Performed at [facility], [address] [place], [place] [address]     Current Facility-Administered Medications  Medication Dose Route Frequency Provider Last Rate Last Admin  . acetaminophen (TYLENOL) tablet 650 mg  650 mg Oral Q6H PRN Alphonzo Devera, Gerlene Burdock, FNP   650 mg at 10/19/19 1338  . alum & mag hydroxide-simeth (MAALOX/MYLANTA) 200-200-20 MG/5ML suspension 30 mL  30 mL Oral Q4H PRN Dailey Alberson, Gerlene Burdock, FNP      . [START ON 10/20/2019] ARIPiprazole (ABILIFY) tablet 10 mg  10 mg Oral Daily Jasmain Ahlberg B, FNP      . divalproex (DEPAKOTE) DR tablet 250 mg  250 mg Oral Q12H Madilynn Montante, Gerlene Burdock, FNP   250 mg at 10/19/19 1339  . gabapentin (NEURONTIN) tablet 300 mg  300 mg Oral TID Shantae Vantol, Gerlene Burdock, FNP   300 mg at 10/19/19 1338  . hydrOXYzine (ATARAX/VISTARIL) tablet 25 mg  25 mg Oral TID PRN Naiyah Klostermann, Gerlene Burdock, FNP      . magnesium hydroxide (MILK OF MAGNESIA) suspension 30 mL  30 mL Oral Daily PRN Tex Conroy, Gerlene Burdock, FNP      . traZODone (DESYREL) tablet 50 mg  50 mg Oral QHS PRN Steele Ledonne, Gerlene Burdock, FNP         Musculoskeletal: Strength & Muscle Tone: within normal limits Gait & Station: normal Patient leans: N/A  Psychiatric Specialty Exam: Physical Exam  Nursing note and vitals reviewed. Constitutional: He is oriented to person, place, and time. He appears well-developed and well-nourished.  Cardiovascular: Normal rate.  Respiratory: Effort normal.  Musculoskeletal:        General: Normal range  of motion.  Neurological: He is alert and oriented to person, place, and time.  Skin: Skin is warm.    Review of Systems  Constitutional: Negative.   HENT: Negative.   Eyes: Negative.   Respiratory: Negative.   Cardiovascular: Negative.   Gastrointestinal: Negative.   Genitourinary: Negative.   Musculoskeletal: Negative.   Skin: Negative.   Neurological: Negative.   Psychiatric/Behavioral: Positive for dysphoric mood, sleep disturbance and suicidal ideas. Negative for self-injury. The patient is not hyperactive.     Blood pressure 118/71, pulse 84, temperature 98.1 F (36.7 C), temperature source Oral, resp. rate 18, height 5\' 10"  (1.778 m), weight 105.2 kg, SpO2 100 %.Body mass index is 33.29 kg/m.  General Appearance: Disheveled  Eye Contact:  Poor  Speech:  Clear and Coherent  Volume:  Decreased  Mood:  Dysphoric  Affect:  Depressed  Thought Process:  Linear  Orientation:  Full (Time, Place, and Person)  Thought Content:  Logical  Suicidal Thoughts:  Only reports SI with no future plans  Homicidal Thoughts:  No  Memory:  Immediate;   Fair Recent;   Fair Remote;   Fair  Judgement:  Fair  Insight:  Fair  Psychomotor Activity:  Normal  Concentration:  Concentration: Fair  Recall:  FiservFair  Fund of Knowledge:  Fair  Language:  Fair  Akathisia:  No  Handed:  Right  AIMS (if indicated):     Assets:  Desire for Improvement  ADL's:  Intact  Cognition:  WNL  Sleep:        Treatment Plan Summary: 43 year old male with history of bipolar illness [facility] but then was given away.  The patient is now waiting for further placement.  Was plan [facility] but [facility] and patient continued to endorse suicidal ideations.  Diagnosis: Bipolar disorder  Medications:  Continue Abilify 10 mg p.o. daily Continue Depakote 250 mg p.o. every 12 hours Continue Neurontin 300 mg p.o. 3 times daily  Disposition: Recommend psychiatric Inpatient admission when medically cleared.  Gerlene Burdockravis B Fynn Adel, FNP 10/19/2019 2:48 PM

## 2019-10-19 NOTE — Progress Notes (Signed)
Admission Note:   Report was received from Amy, RN on a 43 year old male who presents IVC in no acute distress for the treatment of SI and command AH. Per report, patient planned to buy a gun to kill himself with. Patient appears flat and depressed. Patient was calm and somewhat cooperative with admission process. Patient presents with passive SI, stating to [facility], RN that he doesn't want to live anymore and wants to take someone with him. Patient contracts for safety upon admission. Patient endorsed AH, reporting that the voices are being negative towards him. Patient also endorses depression/anxiety, rating them both an "8/10", but he did not go into detail, stating he just wanted to go lay down. Patient denies HI/VH. Patient has an extensive past medical history of HTN, Angina, Arthritis, SOB, Bipolar, Depression and Schizophrenia. This Probation officer was informed by Jinny Blossom, RN that patient would like to go somewhere long-term for treatment. Skin was assessed with Jinny Blossom, RN and found to be clear of any abnormal marks apart from some acne on his back, and multiple tattoos. Patient searched and no contraband found and unit policies explained and understanding verbalized. Consents obtained. Food and fluids offered, and fluids accepted. Patient had no additional questions or concerns at this time.

## 2019-10-19 NOTE — BHH Counselor (Addendum)
Shanta from [facility] called back and stated patient was being denied due to medical and acuity per Dr. Merrie Roof 10/19/19   [facility] called stating they had male beds available; requesting COVID testing results; faxed over immediately for bed consideration  Ruweyda Macknight

## 2019-10-19 NOTE — ED Notes (Signed)
Pt discharged to BMU. VS stable. All belongings sent with patient. Pt accepting of disposition. Report given to [facility], Therapist, sports.

## 2019-10-20 DIAGNOSIS — F19959 Other psychoactive substance use, unspecified with psychoactive substance-induced psychotic disorder, unspecified: Secondary | ICD-10-CM

## 2019-10-20 DIAGNOSIS — F14150 Cocaine abuse with cocaine-induced psychotic disorder with delusions: Secondary | ICD-10-CM

## 2019-10-20 MED ORDER — ARIPIPRAZOLE 10 MG PO TABS
20.0000 mg | ORAL_TABLET | Freq: Every day | ORAL | Status: DC
  Administered 2019-10-21 – 2019-10-22 (×2): 20 mg via ORAL
  Filled 2019-10-20 (×2): qty 2

## 2019-10-20 MED ORDER — DIVALPROEX SODIUM 500 MG PO DR TAB
1500.0000 mg | DELAYED_RELEASE_TABLET | Freq: Every day | ORAL | Status: DC
  Administered 2019-10-20 – 2019-10-27 (×8): 1500 mg via ORAL
  Filled 2019-10-20 (×8): qty 3

## 2019-10-20 NOTE — Plan of Care (Signed)
Patient is newly admitted. Stayed in the milieu but anxious and restless. Sad and depressed. Frequently presenting to the nurses station to request services. Complained of insomnia and anxiety. Received vistaril and Trazodone but has not been able to sleep yet. Patient has just presented to the nurses station to request a snack. No aggressive behaviors noted. Support and encouragements provided. Safety monitored as recommended.

## 2019-10-20 NOTE — H&P (Signed)
Psychiatric Admission Assessment Adult  Patient Identification: Cory Floyd MRN:  [id number] Date of Evaluation:  10/20/2019 Chief Complaint:  Bipolar affective ([place]) [F31.9] Principal Diagnosis: Bipolar disorder without psychotic features ([place]) Diagnosis:  Principal Problem:   Bipolar disorder without psychotic features ([place]) Active Problems:   Gastroesophageal reflux disease without esophagitis   Bipolar affective ([place])   Cocaine abuse ([place])  History of Present Illness: Patient seen and chart reviewed.  43 year old man known from previous encounters with a history of recurrent depression diagnosis of bipolar disorder and a history of substance abuse.  Presented to an outside hospital with physical complaints but then started saying that he was thinking of killing himself.  On interview today the patient is laying in bed not getting up and not interacting.  Makes almost no eye contact.  Tells me he still healers hallucinations frequently telling him "do it".  He is having active suicidal thoughts.  He claims that he recently purchased a gun and refuses to tell me where he put it.  He says he is thinking of shooting himself.  Since last time in the hospital he has not been taking his medicine not been following up with any outpatient treatment.  He was in the COVID-19 hotel for a few days before getting out and since then has just been going from one place to another.  Admits to ongoing cocaine abuse Associated Signs/Symptoms: Depression Symptoms:  depressed mood, anhedonia, hypersomnia, feelings of worthlessness/guilt, difficulty concentrating, hopelessness, recurrent thoughts of death, suicidal thoughts with specific plan, loss of energy/fatigue, disturbed sleep, (Hypo) Manic Symptoms:  Distractibility, Anxiety Symptoms:  Excessive Worry, Psychotic Symptoms:  Hallucinations: Auditory Paranoia, PTSD Symptoms: Negative Total Time spent with patient: 1 hour  Past Psychiatric  History: Patient has a history of multiple prior hospitalizations.  Often very similar presentation.  He usually stays in his room in bed almost the whole time.  Unclear if he is ever really followed up consistently with outpatient treatment.  The idea of secondary gain has been raised as a possible issue in the past but the patient also seems to get very depressed and does have a past history of suicide attempts  Is the patient at risk to self? Yes.    Has the patient been a risk to self in the past 6 months? Yes.    Has the patient been a risk to self within the distant past? Yes.    Is the patient a risk to others? No.  Has the patient been a risk to others in the past 6 months? No.  Has the patient been a risk to others within the distant past? No.   Prior Inpatient Therapy:   Prior Outpatient Therapy:    Alcohol Screening: 1. How often do you have a drink containing alcohol?: Never 2. How many drinks containing alcohol do you have on a typical day when you are drinking?: 1 or 2 3. How often do you have six or more drinks on one occasion?: Never AUDIT-C Score: 0 4. How often during the last year have you found that you were not able to stop drinking once you had started?: Never 5. How often during the last year have you failed to do what was normally expected from you becasue of drinking?: Never 6. How often during the last year have you needed a first drink in the morning to get yourself going after a heavy drinking session?: Never 7. How often during the last year have you had a feeling  of guilt of remorse after drinking?: Never 8. How often during the last year have you been unable to remember what happened the night before because you had been drinking?: Never 9. Have you or someone else been injured as a result of your drinking?: No 10. Has a relative or friend or a doctor or another health worker been concerned about your drinking or suggested you cut down?: No Alcohol Use Disorder  Identification Test Final Score (AUDIT): 0 Alcohol Brief Interventions/Follow-up: AUDIT Score <7 follow-up not indicated, Continued Monitoring Substance Abuse History in the last 12 months:  Yes.   Consequences of Substance Abuse: Medical Consequences:  Worsening depression Previous Psychotropic Medications: Yes  Psychological Evaluations: Yes  Past Medical History:  Past Medical History:  Diagnosis Date  . 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 Anginal pain (HCC)   . Arthritis   . Bipolar 1 disorder (HCC)   . Depressed   . Dyspnea   . Fatty liver   . Schizophrenia [facility](HCC)     Past Surgical History:  Procedure Laterality Date  . COLONOSCOPY  02/05/2014   diverticulosis, hyperplastic polyp x 2  . fracture arm Right   . KNEE SURGERY Right 2013  . NASAL SEPTUM SURGERY    . NASAL TURBINATE REDUCTION Bilateral 12/12/2016   Procedure: TURBINATE REDUCTION/SUBMUCOSAL RESECTION;  Surgeon: Linus Salmonshapman [name], MD;  Location: [facility] ORS;  Service: ENT;  Laterality: Bilateral;   Family History:  Family History  Problem Relation Age of Onset  . Hepatitis C Mother   . Cancer Mother   . Cancer Maternal Aunt   . Hypertension Maternal Grandmother    Family Psychiatric  History: See previous notes nothing known Tobacco Screening: Have you used any form of tobacco in the last 30 days? (Cigarettes, Smokeless Tobacco, Cigars, and/or Pipes): Yes Tobacco use, Select all that apply: 5 or more cigarettes per day Are you interested in Tobacco Cessation Medications?: No, patient refused Counseled patient on smoking cessation including recognizing danger situations, developing coping skills and basic information about quitting provided: Refused/Declined practical counseling Social History:  Social History   Substance and Sexual Activity  Alcohol Use Yes     Social History   Substance and Sexual Activity  Drug Use No   Comment: history of cocaine per Dr Mikey BussingMcQueen's H&P    Additional Social  History: Does patient have children?: Yes    Pain Medications: see PTA Prescriptions: see PTA Over the Counter: see PTA History of alcohol / drug use?: No history of alcohol / drug abuse                    Allergies:   Allergies  Allergen Reactions  . Carrot [Daucus Carota] Anaphylaxis and Rash  . Penicillins Anaphylaxis and Hives    Has patient had a PCN reaction causing immediate rash, facial/tongue/throat swelling, SOB or lightheadedness with hypotension: Yes Has patient had a PCN reaction causing severe rash involving mucus membranes or skin necrosis: No Has patient had a PCN reaction that required hospitalization Yes Has patient had a PCN reaction occurring within the last 10 years: No If all of the above answers are "NO", then may proceed with Cephalosporin use.    Lab Results:  Results for orders placed or performed during the hospital encounter of 10/15/19 (from the past 48 hour(s))  Respiratory Panel by RT PCR (Flu A&B, Covid) - Nasopharyngeal Swab     Status: None   Collection Time: 10/19/19 10:11 AM  Specimen: Nasopharyngeal Swab  Result Value Ref Range   SARS Coronavirus 2 by RT PCR NEGATIVE NEGATIVE    Comment: (NOTE) SARS-CoV-2 target nucleic acids are NOT DETECTED. The SARS-CoV-2 RNA is generally detectable in upper respiratoy specimens during the acute phase of infection. The lowest concentration of SARS-CoV-2 viral copies this assay can detect is 131 copies/mL. A negative result does not preclude SARS-Cov-2 infection and should not be used as the sole basis for treatment or other patient management decisions. A negative result may occur with  improper specimen collection/handling, submission of specimen other than nasopharyngeal swab, presence of viral mutation(s) within the areas targeted by this assay, and inadequate number of viral copies (<131 copies/mL). A negative result must be combined with clinical observations, patient history, and  epidemiological information. The expected result is Negative. Fact Sheet for Patients:  https://www.moore.com/ Fact Sheet for Healthcare Providers:  https://www.young.biz/ This test is not yet ap proved or cleared by the [place] FDA and  has been authorized for detection and/or diagnosis of SARS-CoV-2 by FDA under an Emergency Use Authorization (EUA). This EUA will remain  in effect (meaning this test can be used) for the duration of the COVID-19 declaration under Section 564(b)(1) of the Act, 21 U.S.C. section 360bbb-3(b)(1), unless the authorization is terminated or revoked sooner.    Influenza A by PCR NEGATIVE NEGATIVE   Influenza B by PCR NEGATIVE NEGATIVE    Comment: (NOTE) The Xpert Xpress SARS-CoV-2/FLU/RSV assay is intended as an aid in  the diagnosis of influenza from Nasopharyngeal swab specimens and  should not be used as a sole basis for treatment. Nasal washings and  aspirates are unacceptable for Xpert Xpress SARS-CoV-2/FLU/RSV  testing. Fact Sheet for Patients: https://www.moore.com/ Fact Sheet for Healthcare Providers: https://www.young.biz/ This test is not yet approved or cleared by the [place] FDA and  has been authorized for detection and/or diagnosis of SARS-CoV-2 by  FDA under an Emergency Use Authorization (EUA). This EUA will remain  in effect (meaning this test can be used) for the duration of the  Covid-19 declaration under Section 564(b)(1) of the Act, 21  U.S.C. section 360bbb-3(b)(1), unless the authorization is  terminated or revoked. Performed at [facility], [address] [place], [place] [address]     Blood Alcohol level:  Lab Results  Component Value Date   [facility] <10 10/15/2019   ETH <10 09/16/2019    Metabolic Disorder Labs:  No results found for: HGBA1C, MPG No results found for: PROLACTIN No results found for: CHOL, TRIG, HDL,  CHOLHDL, VLDL, LDLCALC  Current Medications: Current Facility-Administered Medications  Medication Dose Route Frequency Provider Last Rate Last Admin  . acetaminophen (TYLENOL) tablet 650 mg  650 mg Oral Q6H PRN Money, Gerlene Burdock, FNP   650 mg at 10/20/19 0810  . alum & mag hydroxide-simeth (MAALOX/MYLANTA) 200-200-20 MG/5ML suspension 30 mL  30 mL Oral Q4H PRN Money, Gerlene Burdock, FNP      . [START ON 10/21/2019] ARIPiprazole (ABILIFY) tablet 20 mg  20 mg Oral Daily Zachariah Pavek T, MD      . divalproex (DEPAKOTE) DR tablet 1,500 mg  1,500 mg Oral QHS Norah Fick T, MD      . gabapentin (NEURONTIN) tablet 300 mg  300 mg Oral TID Money, Gerlene Burdock, FNP   300 mg at 10/20/19 1234  . hydrOXYzine (ATARAX/VISTARIL) tablet 25 mg  25 mg Oral TID PRN Money, Gerlene Burdock, FNP   25 mg at 10/20/19 0810  . magnesium  hydroxide (MILK OF MAGNESIA) suspension 30 mL  30 mL Oral Daily PRN Money, Gerlene Burdock, FNP      . traZODone (DESYREL) tablet 50 mg  50 mg Oral QHS PRN Money, Gerlene Burdock, FNP   50 mg at 10/19/19 2122   PTA Medications: Medications Prior to Admission  Medication Sig Dispense Refill Last Dose  . acetaminophen (TYLENOL) 325 MG tablet Take 2 tablets (650 mg total) by mouth every 6 (six) hours as needed for mild pain, moderate pain or fever. 14 tablet 0   . ARIPiprazole (ABILIFY) 20 MG tablet Take 1 tablet (20 mg total) by mouth daily. 7 tablet 0   . ascorbic acid (VITAMIN C) 500 MG tablet Take 1 tablet (500 mg total) by mouth daily. 7 tablet 0   . divalproex (DEPAKOTE ER) 500 MG 24 hr tablet Take 3 tablets (1,500 mg total) by mouth daily with supper. 21 tablet 0   . gabapentin (NEURONTIN) 300 MG capsule Take 1 capsule (300 mg total) by mouth 3 (three) times daily. 21 capsule 0   . ibuprofen (ADVIL) 800 MG tablet Take 1 tablet (800 mg total) by mouth every 8 (eight) hours as needed for moderate pain (back pain). 14 tablet 0   . mirtazapine (REMERON) 15 MG tablet Take 1 tablet (15 mg total) by mouth at bedtime. 7  tablet 0   . zinc sulfate 220 (50 Zn) MG capsule Take 1 capsule (220 mg total) by mouth daily. 7 capsule 0     Musculoskeletal: Strength & Muscle Tone: within normal limits Gait & Station: normal Patient leans: N/A  Psychiatric Specialty Exam: Physical Exam  Nursing note and vitals reviewed. Constitutional: He appears well-developed and well-nourished.  HENT:  Head: Normocephalic and atraumatic.  Eyes: Pupils are equal, round, and reactive to light. Conjunctivae are normal.  Cardiovascular: Regular rhythm and normal heart sounds.  Respiratory: Effort normal. No respiratory distress.  GI: Soft.  Musculoskeletal:        General: Normal range of motion.     Cervical back: Normal range of motion.  Neurological: He is alert.  Skin: Skin is warm and dry.  Psychiatric: His affect is blunt. His speech is delayed. He is slowed. Cognition and memory are impaired. He expresses impulsivity and inappropriate judgment. He exhibits a depressed mood. He expresses suicidal ideation. He expresses suicidal plans.    Review of Systems  Constitutional: Negative.   HENT: Negative.   Eyes: Negative.   Respiratory: Negative.   Cardiovascular: Negative.   Gastrointestinal: Negative.   Musculoskeletal: Negative.   Skin: Negative.   Neurological: Negative.   Psychiatric/Behavioral: Positive for dysphoric mood, hallucinations and suicidal ideas. Negative for agitation, behavioral problems, confusion, decreased concentration and self-injury. The patient is nervous/anxious.     Blood pressure 115/90, pulse 83, temperature 97.8 F (36.6 C), temperature source Oral, resp. rate 18, height  (1.778 m), weight 105.2 kg, SpO2 98 %.Body mass index is 33.29 kg/m.  General Appearance: Disheveled  Eye Contact:  None  Speech:  Slow  Volume:  Decreased  Mood:  Anxious, Depressed and Hopeless  Affect:  Constricted  Thought Process:  Coherent  Orientation:  Full (Time, Place, and Person)  Thought Content:   Illogical, Hallucinations: Auditory, Paranoid Ideation and Rumination  Suicidal Thoughts:  Yes.  with intent/plan  Homicidal Thoughts:  No  Memory:  Immediate;   Fair Recent;   Fair Remote;   Fair  Judgement:  Impaired  Insight:  Shallow  Psychomotor Activity:  Decreased  Concentration:  Attention Span: Poor  Recall:  Poor  Fund of Knowledge:  Poor  Language:  Poor  Akathisia:  No  Handed:  Right  AIMS (if indicated):     Assets:  Desire for Improvement Resilience  ADL's:  Impaired  Cognition:  Impaired,  Mild  Sleep:  Number of Hours: 7    Treatment Plan Summary: Daily contact with patient to assess and evaluate symptoms and progress in treatment, Medication management and Plan 43 year old man with a history of depression and substance abuse presents as depressed withdrawn with suicidal thoughts.  Cooperative with medicine.  Reviewed chart from previous hospitalization and I will increase his Depakote and antipsychotic doses consistent with what he was taking previously.  Strongly encourage patient to get out of bed and be more interactive on the unit.  He does have a positive COVID-19 test but is not symptomatic and test is estimated to stay positive for some time to come.  Observation Level/Precautions:  15 minute checks  Laboratory:  Chemistry Profile  Psychotherapy:    Medications:    Consultations:    Discharge Concerns:    Estimated LOS:  Other:     Physician Treatment Plan for Primary Diagnosis: Bipolar disorder without psychotic features (HCC) Long Term Goal(s): Improvement in symptoms so as ready for discharge  Short Term Goals: Ability to disclose and discuss suicidal ideas and Ability to demonstrate self-control will improve  Physician Treatment Plan for Secondary Diagnosis: Principal Problem:   Bipolar disorder without psychotic features (HCC) Active Problems:   Gastroesophageal reflux disease without esophagitis   Bipolar affective (HCC)   Cocaine abuse  (HCC)  Long Term Goal(s): Improvement in symptoms so as ready for discharge  Short Term Goals: Ability to identify changes in lifestyle to reduce recurrence of condition will improve, Ability to verbalize feelings will improve and Compliance with prescribed medications will improve  I certify that inpatient services furnished can reasonably be expected to improve the patient's condition.    Mordecai Rasmussen, MD 12/28/20202:37 PM

## 2019-10-20 NOTE — [facility] Group Notes (Signed)
LCSW Group Therapy Note   10/20/2019 1:00 PM  Type of Therapy and Topic:  Group Therapy:  Overcoming Obstacles   Participation Level:  Did Not Attend   Description of Group:    In this group patients will be encouraged to explore what they see as obstacles to their own wellness and recovery. They will be guided to discuss their thoughts, feelings, and behaviors related to these obstacles. The group will process together ways to cope with barriers, with attention given to specific choices patients can make. Each patient will be challenged to identify changes they are motivated to make in order to overcome their obstacles. This group will be process-oriented, with patients participating in exploration of their own experiences as well as giving and receiving support and challenge from other group members.   Therapeutic Goals: 1. Patient will identify personal and current obstacles as they relate to admission. 2. Patient will identify barriers that currently interfere with their wellness or overcoming obstacles.  3. Patient will identify feelings, thought process and behaviors related to these barriers. 4. Patient will identify two changes they are willing to make to overcome these obstacles:      Summary of Patient Progress X   Therapeutic Modalities:   Cognitive Behavioral Therapy Solution Focused Therapy Motivational Interviewing Relapse Prevention Therapy  Jolyne Laye, MSW, LCSW 10/20/2019 11:21 AM    

## 2019-10-20 NOTE — Plan of Care (Signed)
D- Patient alert and oriented. Patient presents in a depressed, but pleasant mood on assessment stating that he "tossed and turned" last night and continues to endorse lower back pain. Patient rated his pain level a "5/10", and did request pain medication from this Probation officer. Patient endorsed passive SI/HI, with a plan, however, he stated that he would not tell this writer what his plan is. Patient did report feeling safe here and would come to staff if anything changes. Patient also endorsed depression/anxiety, rating them a "7/10" and "6/10", stating that another patient on the unit is making him anxious because "you can't say anything around her". Patient denies AVH at this time. Patient had no stated goals for today.  A- Scheduled medications administered to patient, per MD orders. Support and encouragement provided.  Routine safety checks conducted every 15 minutes.  Patient informed to notify staff with problems or concerns.  R- No adverse drug reactions noted. Patient contracts for safety at this time. Patient compliant with medications and treatment plan. Patient receptive, calm, and cooperative. Patient interacts well with others on the unit.  Patient remains safe at this time.  Problem: Education: Goal: Ability to state activities that reduce stress will improve Outcome: Not Progressing   Problem: Coping: Goal: Ability to identify and develop effective coping behavior will improve Outcome: Not Progressing   Problem: Self-Concept: Goal: Level of anxiety will decrease Outcome: Not Progressing Goal: Ability to modify response to factors that promote anxiety will improve Outcome: Not Progressing   Problem: Education: Goal: Utilization of techniques to improve thought processes will improve Outcome: Not Progressing Goal: Knowledge of the prescribed therapeutic regimen will improve Outcome: Not Progressing   Problem: Activity: Goal: Interest or engagement in leisure activities will  improve Outcome: Not Progressing Goal: Imbalance in normal sleep/wake cycle will improve Outcome: Not Progressing   Problem: Coping: Goal: Will verbalize feelings Outcome: Not Progressing   Problem: Role Relationship: Goal: Will demonstrate positive changes in social behaviors and relationships Outcome: Not Progressing   Problem: Coping: Goal: Coping ability will improve Outcome: Not Progressing   Problem: Medication: Goal: Compliance with prescribed medication regimen will improve Outcome: Not Progressing   Problem: Self-Concept: Goal: Ability to disclose and discuss suicidal ideas will improve Outcome: Not Progressing Goal: Will verbalize positive feelings about self Outcome: Not Progressing

## 2019-10-20 NOTE — BHH Counselor (Signed)
Adult Comprehensive Assessment  Patient ID: Cory Floyd, male   DOB: Mar 08, 1976, 43 y.o.   MRN: [id number]   Information Source: Information source: Patient   Current Stressors:  Patient states their primary concerns and needs for treatment are:: Pt reports "I wanted to kill myself.  I still do." Patient states their goals for this hospitalization and ongoing recovery are:: Pt reports "a longer treatment program".  Educational / Learning stressors: high school diploma Family Relationships: Chief Executive Officer / Lack of resources (include bankruptcy): [place] / Lack of housing: Pt reports he is homeless Physical health (include injuries & life threatening diseases): Pt denies. Substance abuse: Pt reports cocaine use.   Living/Environment/Situation:  Living Arrangements: Other (Comment)(homeless) Living conditions (as described by patient or guardian): pt is homeless What is atmosphere in current home: Temporary   Family History:  Marital status: Single Does patient have children?: Yes How many children?: 2 How is patient's relationship with their children?: Pt reported "they're all grown" and reported relationship is "good when they speak to me".     Childhood History:  Additional childhood history information: Pt reports he was in and out the system Does patient have siblings?: 1 Number of siblings?:1 (sister) Description of patient's current relationship with siblings?: Pt reports "none". Did patient suffer any verbal/emotional/physical/sexual abuse as a child?: (Pt reported "I ain't discussing none of that")   Education:  Highest grade of school patient has completed: 12th Currently a student?: No Learning disability?: (Pt reported "I don't know")   Employment/Work Situation:   Employment situation: On disability Why is patient on disability: mental health How long has patient been on disability: Unknown What is the longest time patient has a held a job?: Pt refused  to answer Did You Receive Any Psychiatric Treatment/Services While in the [employer]?: No Are There Guns or Other Weapons in [place]?: No Are These Weapons Safely Secured?: (N/A)   Financial Resources:   Financial resources: Marine scientist SSDI Does patient have a Programmer, applications or guardian?: No   Alcohol/Substance Abuse:   What has been your use of drugs/alcohol within the last 12 months?: Pt reports cocaine use.  He reports that uses $40/day.   If attempted suicide, did drugs/alcohol play a role in this?: No If yes, describe treatment: unknown Has alcohol/substance abuse ever caused legal problems?: (Pt didn't answer, but court calendar shows pt had court 12/7 and scheduled today 12/8 for identity theft and traffic ticket)   [place]:   Describe [place]: None reported Type of faith/religion: None reported   Leisure/Recreation:   Leisure and Hobbies: "Watch TV"   Strengths/Needs:   What is the patient's perception of their strengths?: No response   Discharge Plan:   Currently receiving community mental health services: Yes (From Whom)([facility]) Patient states concerns and preferences for aftercare planning are: Pt wants to continue with [facility]. Patient states they will know when they are safe and ready for discharge when: Pt didn't respond Does patient have access to transportation?: Yes Does patient have financial barriers related to discharge medications?: No Plan for no access to transportation at discharge: public transportation Will patient be returning to same living situation after discharge?: (Pt is homeless)   Summary/Recommendations:   Patient is a 43 year old male from [place], [place] [facility]   He reports that he receives disability and has Medicare.  He presents to the hospital following suicidal and homicidal ideations.  He has a primary diagnosis of Bipolar Affective Disorder, Bipolar  type.  Recommendations  include: crisis stabilization, therapeutic milieu, encourage group attendance and participation, medication management for detox/mood stabilization and development of comprehensive mental wellness/sobriety plan.        Harden Mo. 10/20/2019

## 2019-10-20 NOTE — BHH Suicide Risk Assessment (Signed)
[facility] Admission Suicide Risk Assessment   Nursing information obtained from:  Patient Demographic factors:  Male, Caucasian, Low socioeconomic status Current Mental Status:  NA Loss Factors:  NA Historical Factors:  NA Risk Reduction Factors:  NA  Total Time spent with patient: 1 hour Principal Problem: Bipolar disorder without psychotic features (HCC) Diagnosis:  Principal Problem:   Bipolar disorder without psychotic features (HCC) Active Problems:   Gastroesophageal reflux disease without esophagitis   Bipolar affective (HCC)   Cocaine abuse (HCC)  Subjective Data: Patient seen chart reviewed.  Patient known from previous encounters.  43 year old man with bipolar disorder and substance abuse presented to the hospital for physical complaints but then claimed to have suicidal ideation.  Still claims to me to be having suicidal ideation.  Claims he recently purchased a gun and is thinking of shooting himself.  Ongoing claims of hallucinations and depression.  Continued Clinical Symptoms:  Alcohol Use Disorder Identification Test Final Score (AUDIT): 0 The "Alcohol Use Disorders Identification Test", Guidelines for Use in Primary Care, Second Edition.  World Science writer [facility]). Score between 0-7:  no or low risk or alcohol related problems. Score between 8-15:  moderate risk of alcohol related problems. Score between 16-19:  high risk of alcohol related problems. Score 20 or above:  warrants further diagnostic evaluation for alcohol dependence and treatment.   CLINICAL FACTORS:   Depression:   Anhedonia Alcohol/Substance Abuse/Dependencies   Musculoskeletal: Strength & Muscle Tone: within normal limits Gait & Station: normal Patient leans: N/A  Psychiatric Specialty Exam: Physical Exam  Nursing note and vitals reviewed. Constitutional: He appears well-developed and well-nourished.  HENT:  Head: Normocephalic and atraumatic.  Eyes: Pupils are equal, round, and  reactive to light. Conjunctivae are normal.  Cardiovascular: Regular rhythm and normal heart sounds.  Respiratory: Effort normal.  GI: Soft.  Musculoskeletal:        General: Normal range of motion.     Cervical back: Normal range of motion.  Neurological: He is alert.  Skin: Skin is warm and dry.  Psychiatric: His affect is blunt. His speech is delayed. He is slowed. Cognition and memory are impaired. He expresses impulsivity. He expresses suicidal ideation. He expresses no homicidal ideation. He expresses suicidal plans.    Review of Systems  Constitutional: Negative.   HENT: Negative.   Eyes: Negative.   Respiratory: Negative.   Cardiovascular: Negative.   Gastrointestinal: Negative.   Musculoskeletal: Negative.   Skin: Negative.   Neurological: Negative.   Psychiatric/Behavioral: Positive for dysphoric mood, hallucinations and suicidal ideas.    Blood pressure 115/90, pulse 83, temperature 97.8 F (36.6 C), temperature source Oral, resp. rate 18, height 5\' 10"  (1.778 m), weight 105.2 kg, SpO2 98 %.Body mass index is 33.29 kg/m.  General Appearance: Disheveled  Eye Contact:  None  Speech:  Slow  Volume:  Decreased  Mood:  Depressed  Affect:  Constricted  Thought Process:  Disorganized  Orientation:  Full (Time, Place, and Person)  Thought Content:  Paranoid Ideation  Suicidal Thoughts:  Yes.  with intent/plan  Homicidal Thoughts:  No  Memory:  Immediate;   Fair Recent;   Fair Remote;   Fair  Judgement:  Impaired  Insight:  Shallow  Psychomotor Activity:  Decreased  Concentration:  Concentration: Fair  Recall:  of Knowledge:  Fair  Language:  Fair  Akathisia:  No  Handed:  Right  AIMS (if indicated):     Assets:  Desire for Improvement  ADL's:  Impaired  Cognition:  Impaired,  Mild  Sleep:  Number of Hours: 7      COGNITIVE FEATURES THAT CONTRIBUTE TO RISK:  Thought constriction (tunnel vision)    SUICIDE RISK:   Mild:  Suicidal ideation of  limited frequency, intensity, duration, and specificity.  There are no identifiable plans, no associated intent, mild dysphoria and related symptoms, good self-control (both objective and subjective assessment), few other risk factors, and identifiable protective factors, including available and accessible social support.  PLAN OF CARE: Continue 15-minute checks.  Restart psychiatric medicine.  Increased doses back to those consistent with what he was taking previously.  Supportive counseling and encouragement to attend groups.  Treatment team will work on evaluating him and seeing what we can do about arranging for good discharge plan  I certify that inpatient services furnished can reasonably be expected to improve the patient's condition.   Alethia Berthold, MD 10/20/2019, 2:34 PM

## 2019-10-20 NOTE — Progress Notes (Signed)
Recreation Therapy Notes  Date: 10/20/2019  Time: 9:30 am   Location: Craft room   Behavioral response: N/A   Intervention Topic: Problem Solving   Discussion/Intervention: Patient did not attend group.   Clinical Observations/Feedback:  Patient did not attend group.   Jettie Lazare LRT/CTRS        Ogechi Kuehnel 10/20/2019 11:11 AM 

## 2019-10-20 NOTE — BHH Suicide Risk Assessment (Signed)
[facility] INPATIENT:  Family/Significant Other Suicide Prevention Education  Suicide Prevention Education:  Patient Refusal for Family/Significant Other Suicide Prevention Education: The patient Cory Floyd has refused to provide written consent for family/significant other to be provided Family/Significant Other Suicide Prevention Education during admission and/or prior to discharge.  Physician notified.  SPE completed with pt, as pt refused to consent to family contact. SPI pamphlet provided to pt and pt was encouraged to share information with support network, ask questions, and talk about any concerns relating to SPE. Pt denies access to guns/firearms and verbalized understanding of information provided. Mobile Crisis information also provided to pt.   Rozann Lesches 10/20/2019, 9:41 AM

## 2019-10-20 NOTE — H&P (Signed)
CSW met with the patient and provided the patient with local food resources, community resources, food pantries, homeless shelters, print out of resources available at [employer], [employer] information and list of boarding houses as patient reports that he received $782/month disability.  CSW encouraged patient to begin making calls to boarding homes and [place] as the phones would be shutting off soon.   CSW notes that patient was awake and talking with CSW, however did not open his eyes or get out of bed to begin making calls.  Assunta Curtis, MSW, LCSW 10/20/2019 2:28 PM

## 2019-10-20 NOTE — BHH Counselor (Signed)
CSW faxed information to [place], [facility], [facility], [facility] and [employer].    CSW received confirmation that all faxes were successful.  Assunta Curtis, MSW, LCSW 10/20/2019 10:28 AM

## 2019-10-21 NOTE — Progress Notes (Signed)
Patient was in his room upon arrival to the unit. Patient pleasant during assessment denying SI/HI/AVH. Patient endorses anxiety and depression rating them 10/10. Patient compliant with medication administration per MD orders. Patient given education. Patient given support and encouragement to be active in his treatment plan. Patient isolative to his room this evening. Patient being monitored Q 15 minutes for safety per unit protocol. Patient remains safe on the unit.

## 2019-10-21 NOTE — Plan of Care (Signed)
Patient denies  suicidal ideations  but voice of want to kill people  would not elaborate.  Compliant with medication received. Limited interaction with his peers and staff.  Working  on  Estate agent and anxiety. Voice no concerns around sleep and wake cycle .  Flustration noted with attempts for placement.    Problem: Education: Goal: Ability to state activities that reduce stress will improve Outcome: Progressing   Problem: Coping: Goal: Ability to identify and develop effective coping behavior will improve Outcome: Progressing   Problem: Self-Concept: Goal: Level of anxiety will decrease Outcome: Progressing Goal: Ability to modify response to factors that promote anxiety will improve Outcome: Progressing   Problem: Education: Goal: Utilization of techniques to improve thought processes will improve Outcome: Progressing Goal: Knowledge of the prescribed therapeutic regimen will improve Outcome: Progressing   Problem: Activity: Goal: Interest or engagement in leisure activities will improve Outcome: Progressing Goal: Imbalance in normal sleep/wake cycle will improve Outcome: Progressing   Problem: Coping: Goal: Will verbalize feelings Outcome: Progressing   Problem: Role Relationship: Goal: Will demonstrate positive changes in social behaviors and relationships Outcome: Progressing   Problem: Coping: Goal: Coping ability will improve Outcome: Progressing   Problem: Medication: Goal: Compliance with prescribed medication regimen will improve Outcome: Progressing   Problem: Self-Concept: Goal: Ability to disclose and discuss suicidal ideas will improve Outcome: Progressing Goal: Will verbalize positive feelings about self Outcome: Progressing

## 2019-10-21 NOTE — Progress Notes (Signed)
The patient was out of his room for snacks and medication. The patient was up at 12:30 asking for another snack, he was given an apple and returned to bed. The patient has been pleasant when interacting with peers and staff. The patient denies SI/HI/AVH. Will continue to monitor and provide support. The patient is able to contract for safety.

## 2019-10-21 NOTE — BHH Counselor (Signed)
CSW called [facility] to provide the authorization number.   CSW was informed that referral had not yet been reviewed.   Assunta Curtis, MSW, LCSW 10/21/2019 10:46 AM

## 2019-10-21 NOTE — BHH Group Notes (Signed)
[place] Group Notes:  (Nursing/MHT/Case Management/Adjunct)  Date:  10/21/2019  Time:  8:49 PM  Type of Therapy:  Group Therapy  Participation Level:  Active  Participation Quality:  Appropriate and keep asking for snack and snack time.  Affect:  Appropriate  Cognitive:  Alert  Insight:  Good  Engagement in Group:  Engaged  Modes of Intervention:  Support  Summary of Progress/Problems:  Cory Floyd 10/21/2019, 8:49 PM

## 2019-10-21 NOTE — Tx Team (Signed)
Interdisciplinary Treatment and Diagnostic Plan Update  10/21/2019 Time of Session: 900am Cory Floyd MRN: [id number]  Principal Diagnosis: Bipolar disorder without psychotic features [facility])  Secondary Diagnoses: Principal Problem:   Bipolar disorder without psychotic features ([place]) Active Problems:   Gastroesophageal reflux disease without esophagitis   Bipolar affective ([place])   Cocaine abuse ([place])   Current Medications:  Current Facility-Administered Medications  Medication Dose Route Frequency Provider Last Rate Last Admin  . acetaminophen (TYLENOL) tablet 650 mg  650 mg Oral Q6H PRN Money, Lowry Ram, FNP   650 mg at 10/20/19 0810  . alum & mag hydroxide-simeth (MAALOX/MYLANTA) 200-200-20 MG/5ML suspension 30 mL  30 mL Oral Q4H PRN Money, Lowry Ram, FNP      . ARIPiprazole (ABILIFY) tablet 20 mg  20 mg Oral Daily Clapacs, [name], MD   20 mg at 10/21/19 0817  . divalproex (DEPAKOTE) DR tablet 1,500 mg  1,500 mg Oral QHS Clapacs, [name], MD   1,500 mg at 10/20/19 2357  . gabapentin (NEURONTIN) tablet 300 mg  300 mg Oral TID Money, Lowry Ram, FNP   300 mg at 10/21/19 1146  . hydrOXYzine (ATARAX/VISTARIL) tablet 25 mg  25 mg Oral TID PRN Money, Lowry Ram, FNP   25 mg at 10/20/19 0810  . magnesium hydroxide (MILK OF MAGNESIA) suspension 30 mL  30 mL Oral Daily PRN Money, Lowry Ram, FNP      . traZODone (DESYREL) tablet 50 mg  50 mg Oral QHS PRN Money, Lowry Ram, FNP   50 mg at 10/19/19 2122   PTA Medications: Medications Prior to Admission  Medication Sig Dispense Refill Last Dose  . acetaminophen (TYLENOL) 325 MG tablet Take 2 tablets (650 mg total) by mouth every 6 (six) hours as needed for mild pain, moderate pain or fever. 14 tablet 0   . ARIPiprazole (ABILIFY) 20 MG tablet Take 1 tablet (20 mg total) by mouth daily. 7 tablet 0   . ascorbic acid (VITAMIN C) 500 MG tablet Take 1 tablet (500 mg total) by mouth daily. 7 tablet 0   . divalproex (DEPAKOTE ER) 500 MG 24 hr tablet Take 3  tablets (1,500 mg total) by mouth daily with supper. 21 tablet 0   . gabapentin (NEURONTIN) 300 MG capsule Take 1 capsule (300 mg total) by mouth 3 (three) times daily. 21 capsule 0   . ibuprofen (ADVIL) 800 MG tablet Take 1 tablet (800 mg total) by mouth every 8 (eight) hours as needed for moderate pain (back pain). 14 tablet 0   . mirtazapine (REMERON) 15 MG tablet Take 1 tablet (15 mg total) by mouth at bedtime. 7 tablet 0   . zinc sulfate 220 (50 Zn) MG capsule Take 1 capsule (220 mg total) by mouth daily. 7 capsule 0     Patient Stressors: Financial difficulties Medication change or noncompliance Substance abuse  Patient Strengths: Capable of independent living General fund of knowledge Physical Health  Treatment Modalities: Medication Management, Group therapy, Case management,  1 to 1 session with clinician, Psychoeducation, Recreational therapy.   Physician Treatment Plan for Primary Diagnosis: Bipolar disorder without psychotic features ([place]) Long Term Goal(s): Improvement in symptoms so as ready for discharge Improvement in symptoms so as ready for discharge   Short Term Goals: Ability to disclose and discuss suicidal ideas Ability to demonstrate self-control will improve Ability to identify changes in lifestyle to reduce recurrence of condition will improve Ability to verbalize feelings will improve Compliance with prescribed medications will improve  Medication Management: Evaluate patient's response, side effects, and tolerance of medication regimen.  Therapeutic Interventions: 1 to 1 sessions, Unit Group sessions and Medication administration.  Evaluation of Outcomes: Not Met  Physician Treatment Plan for Secondary Diagnosis: Principal Problem:   Bipolar disorder without psychotic features ([place]) Active Problems:   Gastroesophageal reflux disease without esophagitis   Bipolar affective ([place])   Cocaine abuse ([place])  Long Term Goal(s): Improvement in symptoms so as  ready for discharge Improvement in symptoms so as ready for discharge   Short Term Goals: Ability to disclose and discuss suicidal ideas Ability to demonstrate self-control will improve Ability to identify changes in lifestyle to reduce recurrence of condition will improve Ability to verbalize feelings will improve Compliance with prescribed medications will improve     Medication Management: Evaluate patient's response, side effects, and tolerance of medication regimen.  Therapeutic Interventions: 1 to 1 sessions, Unit Group sessions and Medication administration.  Evaluation of Outcomes: Not Met   RN Treatment Plan for Primary Diagnosis: Bipolar disorder without psychotic features ([place]) Long Term Goal(s): Knowledge of disease and therapeutic regimen to maintain health will improve  Short Term Goals: Ability to remain free from injury will improve, Ability to verbalize frustration and anger appropriately will improve, Ability to demonstrate self-control, Ability to participate in decision making will improve and Compliance with prescribed medications will improve  Medication Management: RN will administer medications as ordered by provider, will assess and evaluate patient's response and provide education to patient for prescribed medication. RN will report any adverse and/or side effects to prescribing provider.  Therapeutic Interventions: 1 on 1 counseling sessions, Psychoeducation, Medication administration, Evaluate responses to treatment, Monitor vital signs and CBGs as ordered, Perform/monitor CIWA, COWS, AIMS and Fall Risk screenings as ordered, Perform wound care treatments as ordered.  Evaluation of Outcomes: Not Met   LCSW Treatment Plan for Primary Diagnosis: Bipolar disorder without psychotic features ([place]) Long Term Goal(s): Safe transition to appropriate next level of care at discharge, Engage patient in therapeutic group addressing interpersonal concerns.  Short Term  Goals: Engage patient in aftercare planning with referrals and resources, Increase emotional regulation and Increase skills for wellness and recovery  Therapeutic Interventions: Assess for all discharge needs, 1 to 1 time with Social worker, Explore available resources and support systems, Assess for adequacy in community support network, Educate family and significant other(s) on suicide prevention, Complete Psychosocial Assessment, Interpersonal group therapy.  Evaluation of Outcomes: Not Met   Progress in Treatment: Attending groups: Yes. Participating in groups: Yes. Taking medication as prescribed: Yes. Toleration medication: Yes. Family/Significant other contact made: No, will contact:  when consent is given Patient understands diagnosis: Yes. Discussing patient identified problems/goals with staff: Yes. Medical problems stabilized or resolved: Yes. Denies suicidal/homicidal ideation: No. Issues/concerns per patient self-inventory: No. Other: N/A  New problem(s) identified: No, Describe:  none  New Short Term/Long Term Goal(s): Detox, elimination of AVH/symptoms of psychosis, medication management for mood stabilization; elimination of SI thoughts; development of comprehensive mental wellness/sobriety plan.   Patient Goals:  "I need a longer treatment plan"  Discharge Plan or Barriers: SPE pamphlet, Mobile Crisis information, and AA/NA information provided to patient for additional community support and resources. Pt has been referred to [facility], [facility], [facility], [facility], [facility], and was given the information to contact [facility].   Reason for Continuation of Hospitalization: Anxiety Depression Homicidal ideation Medication stabilization Suicidal ideation  Estimated Length of Stay: 5-7 days  Attendees: Patient: Cory Floyd 10/21/2019 1:56 PM  Physician: Dr Weber Cooks 10/21/2019 1:56 PM  Nursing: Polly Cobia RN 10/21/2019 1:56 PM  RN Care Manager: 10/21/2019 1:56  PM  Social Worker: Evalina Field LCSW 10/21/2019 1:56 PM  Recreational Therapist: Roanna Epley CTRS LRT 10/21/2019 1:56 PM  Other: Assunta Curtis LCSW  10/21/2019 1:56 PM  Other:  10/21/2019 1:56 PM  Other: 10/21/2019 1:56 PM    Scribe for Treatment Team: Mariann Laster Aslee Such, LCSW 10/21/2019 1:56 PM

## 2019-10-21 NOTE — [facility] Group Notes (Signed)
  LCSW Group Therapy Note  10/21/2019 1:15 PM   Type of Therapy/Topic:  Group Therapy:  Feelings about Diagnosis  Participation Level:  Minimal   Description of Group:   This group will allow patients to explore their thoughts and feelings about diagnoses they have received. Patients will be guided to explore their level of understanding and acceptance of these diagnoses. Facilitator will encourage patients to process their thoughts and feelings about the reactions of others to their diagnosis and will guide patients in identifying ways to discuss their diagnosis with significant others in their lives. This group will be process-oriented, with patients participating in exploration of their own experiences, giving and receiving support, and processing challenge from other group members.   Therapeutic Goals: 1. Patient will demonstrate understanding of diagnosis as evidenced by identifying two or more symptoms of the disorder 2. Patient will be able to express two feelings regarding the diagnosis 3. Patient will demonstrate their ability to communicate their needs through discussion and/or role play  Summary of Patient Progress: Pt was present in group. Pt left group at one point for about 5 minutes and then came back. Pt was getting irritated with another pt being hyperverbal in group and would talk over the other pt in an effort to get her to stop talking. Pt then left group and when he came back did fine and discussed being bipolar and not feeling like he has a medium because it's either high or low, never in between.   Therapeutic Modalities:   Cognitive Behavioral Therapy Brief Therapy Feelings Identification    Evalina Field, MSW, LCSW Clinical Social Work 10/21/2019 1:15 PM

## 2019-10-21 NOTE — [facility] Counselor (Signed)
CSW assisted patient in completing application for the [facility] program.  CSW followed up with patient's phone interview with [facility].  He reports that he was told that they are completing applications, however are not admitting people due to Covid-19 restrictions.  Assunta Curtis, MSW, LCSW 10/21/2019 3:17 PM

## 2019-10-21 NOTE — Progress Notes (Signed)
[facility] MD Progress Note  10/21/2019 11:55 AM Cory Floyd  MRN:  [id number] Subjective: Patient seen and chart reviewed.  43 year old man with a history of bipolar disorder and substance abuse.  Patient attended treatment team today.  He tells [place] that he needs "a long program".  He clearly has no place to live right now and no place that he is supported.  Patient also told us during treatment team that he continues to have suicidal and homicidal ideation.  Said all of this very matter-of-factly would not identify who he supposedly has homicidal ideation towards.  Behavior has been calm and he has been cooperative with social work and treatment team working on arrangements to try and find some kind of program for him at discharge.  No new physical complaints.  Does not appear to be obviously psychotic and denies any hallucinations Principal Problem: Bipolar disorder without psychotic features (HCC) Diagnosis: Principal Problem:   Bipolar disorder without psychotic features (HCC) Active Problems:   Gastroesophageal reflux disease without esophagitis   Bipolar affective (HCC)   Cocaine abuse (HCC)  Total Time spent with patient: 30 minutes  Past Psychiatric History: Patient has a history of substance abuse bipolar disorder suicidal behavior and threats and frequent hospitalization especially when homeless  Past Medical History:  Past Medical History:  Diagnosis Date  . 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 Anginal pain (HCC)   . Arthritis   . Bipolar 1 disorder (HCC)   . Depressed   . Dyspnea   . Fatty liver   . Schizophrenia [facility])     Past Surgical History:  Procedure Laterality Date  . COLONOSCOPY  02/05/2014   diverticulosis, hyperplastic polyp x 2  . fracture arm Right   . KNEE SURGERY Right 2013  . NASAL SEPTUM SURGERY    . NASAL TURBINATE REDUCTION Bilateral 12/12/2016   Procedure: TURBINATE REDUCTION/SUBMUCOSAL RESECTION;  Surgeon: Linus Salmons, MD;  Location: [facility] ORS;   Service: ENT;  Laterality: Bilateral;   Family History:  Family History  Problem Relation Age of Onset  . Hepatitis C Mother   . Cancer Mother   . Cancer Maternal Aunt   . Hypertension Maternal Grandmother    Family Psychiatric  History: See previous Social History:  Social History   Substance and Sexual Activity  Alcohol Use Yes     Social History   Substance and Sexual Activity  Drug Use No   Comment: history of cocaine per Dr Mikey Bussing H&P    Social History   Socioeconomic History  . Marital status: Divorced    Spouse name: Not on file  . Number of children: Not on file  . Years of education: Not on file  . Highest education level: Not on file  Occupational History  . Not on file  Tobacco Use  . Smoking status: Current Every Day Smoker    Packs/day: 1.00    Years: 20.00    Pack years: 20.00    Types: Cigarettes  . Smokeless tobacco: Never Used  Substance and Sexual Activity  . Alcohol use: Yes  . Drug use: No    Comment: history of cocaine per Dr Mikey Bussing H&P  . Sexual activity: Yes    Birth control/protection: None  Other Topics Concern  . Not on file  Social History Narrative  . Not on file   Social Determinants of Health   Financial Resource Strain:   . Difficulty of Paying Living Expenses: Not on file  Food  Insecurity:   . Worried About Running Out of Food in the Last Year: Not on file  . Ran Out of Food in the Last Year: Not on file  TransporProgramme researcher, broadcasting/film/videotation Needs:   . Lack of Transportation (Medical): Not on file  . Lack of Transportation (Non-Medical): Not on file  Physical Activity:   . Days of Exercise per Week: Not on file  . Minutes of Exercise per Session: Not on file  Stress:   . Feeling of Stress : Not on file  Social Connections:   . Frequency of Communication with Friends and Family: Not on file  . Frequency of Social Gatherings with Friends and Family: Not on file  . Attends Religious Services: Not on file  . Active Member of Clubs or  Organizations: Not on file  . Attends BankerClub or Organization Meetings: Not on file  . Marital Status: Not on file   Additional Social History:    Pain Medications: see PTA Prescriptions: see PTA Over the Counter: see PTA History of alcohol / drug use?: No history of alcohol / drug abuse                    Sleep: Fair  Appetite:  Fair  Current Medications: Current Facility-Administered Medications  Medication Dose Route Frequency Provider Last Rate Last Admin  . acetaminophen (TYLENOL) tablet 650 mg  650 mg Oral Q6H PRN Money, Gerlene Burdockravis B, FNP   650 mg at 10/20/19 0810  . alum & mag hydroxide-simeth (MAALOX/MYLANTA) 200-200-20 MG/5ML suspension 30 mL  30 mL Oral Q4H PRN Money, Gerlene Burdockravis B, FNP      . ARIPiprazole (ABILIFY) tablet 20 mg  20 mg Oral Daily , Jackquline Denmark T, MD   20 mg at 10/21/19 0817  . divalproex (DEPAKOTE) DR tablet 1,500 mg  1,500 mg Oral QHS ,  T, MD   1,500 mg at 10/20/19 2357  . gabapentin (NEURONTIN) tablet 300 mg  300 mg Oral TID Money, Gerlene Burdockravis B, FNP   300 mg at 10/21/19 1146  . hydrOXYzine (ATARAX/VISTARIL) tablet 25 mg  25 mg Oral TID PRN Money, Gerlene Burdockravis B, FNP   25 mg at 10/20/19 0810  . magnesium hydroxide (MILK OF MAGNESIA) suspension 30 mL  30 mL Oral Daily PRN Money, Gerlene Burdockravis B, FNP      . traZODone (DESYREL) tablet 50 mg  50 mg Oral QHS PRN Money, Gerlene Burdockravis B, FNP   50 mg at 10/19/19 2122    Lab Results: No results found for this or any previous visit (from the past 48 hour(s)).  Blood Alcohol level:  Lab Results  Component Value Date   ETH <10 10/15/2019   ETH <10 09/16/2019    Metabolic Disorder Labs: No results found for: HGBA1C, MPG No results found for: PROLACTIN No results found for: CHOL, TRIG, HDL, CHOLHDL, VLDL, LDLCALC  Physical Findings: AIMS:  , ,  ,  ,    CIWA:    COWS:     Musculoskeletal: Strength & Muscle Tone: within normal limits Gait & Station: normal Patient leans: N/A  Psychiatric Specialty Exam: Physical Exam   Nursing note and vitals reviewed. Constitutional: He appears well-developed and well-nourished.  HENT:  Head: Normocephalic and atraumatic.  Eyes: Pupils are equal, round, and reactive to light. Conjunctivae are normal.  Cardiovascular: Regular rhythm and normal heart sounds.  Respiratory: Effort normal. No respiratory distress.  GI: Soft.  Musculoskeletal:        General: Normal range of motion.  Cervical back: Normal range of motion.  Neurological: He is alert.  Skin: Skin is warm and dry.  Psychiatric: His speech is normal and behavior is normal. His mood appears anxious. Cognition and memory are impaired. He expresses inappropriate judgment. He exhibits a depressed mood. He expresses homicidal and suicidal ideation.    Review of Systems  Constitutional: Negative.   HENT: Negative.   Eyes: Negative.   Respiratory: Negative.   Cardiovascular: Negative.   Gastrointestinal: Negative.   Musculoskeletal: Negative.   Skin: Negative.   Neurological: Negative.   Psychiatric/Behavioral: Positive for suicidal ideas.    Blood pressure 95/65, pulse 86, temperature 97.8 F (36.6 C), temperature source Oral, resp. rate 18, height 5\' 10"  (1.778 m), weight 105.2 kg, SpO2 98 %.Body mass index is 33.29 kg/m.  General Appearance: Casual  Eye Contact:  Fair  Speech:  Clear and Coherent  Volume:  Normal  Mood:  Depressed  Affect:  Appropriate  Thought Process:  Coherent  Orientation:  Full (Time, Place, and Person)  Thought Content:  Illogical  Suicidal Thoughts:  Yes.  with intent/plan  Homicidal Thoughts:  Yes.  without intent/plan  Memory:  Immediate;   Fair Recent;   Fair Remote;   Fair  Judgement:  Impaired  Insight:  Shallow  Psychomotor Activity:  Normal  Concentration:  Concentration: Fair  Recall:  [employer] of Knowledge:  Fair  Language:  Fair  Akathisia:  No  Handed:  Right  AIMS (if indicated):     Assets:  Communication Skills Desire for Improvement Physical  Health Resilience  ADL's:  Impaired  Cognition:  WNL  Sleep:  Number of Hours: 7     Treatment Plan Summary: Daily contact with patient to assess and evaluate symptoms and progress in treatment, Medication management and Plan Patient is pretty straightforward about his primary need being long-term living situation.  He endorses suicidal and homicidal ideation but does so in a rather perfunctory manner without any sign of any acute  mood disturbance.  Tolerating medicines.  Encourage patient to attend groups.  No change to medicine.  He already has some phone numbers to call looking into some possible substance abuse programs.  Alethia Berthold, MD 10/21/2019, 11:55 AM

## 2019-10-21 NOTE — Progress Notes (Signed)
D:  Patient stated slept good last night .Stated appetite  good and energy level low . Stated concentration poor . Stated on Depression scale 10 , hopeless 10 and anxiety 0 .( low 0-10 high) Denies suicidal  homicidal ideations  .  No auditory hallucinations  No pain concerns . Appropriate ADL'S. Interacting with peers and staff.   A: Encourage patient participation with unit programming . Instruction  Given on  Medication , verbalize understanding.Patient  Continue to voice  suicidal ideations  but voice of wanting  to kill people  would not elaborate.  Compliant with medication received. Verbalize understanding Limited interaction with his peers and staff.  Working  on  Estate agent and anxiety. Voice no concerns around sleep and wake cycle .  Flustration noted with attempts for placement.   R: Voice no other concerns. Staff continue to monitor

## 2019-10-21 NOTE — Plan of Care (Signed)
Patient endorses anxiety  Problem: Self-Concept: Goal: Level of anxiety will decrease Outcome: Not Progressing   

## 2019-10-21 NOTE — [facility] Counselor (Signed)
CSW contacted [facility] to see if any available beds.  CSW was informed that shelter was full.  Assunta Curtis, MSW, LCSW 10/21/2019 10:47 AM

## 2019-10-22 MED ORDER — ARIPIPRAZOLE 10 MG PO TABS
30.0000 mg | ORAL_TABLET | Freq: Every day | ORAL | Status: DC
  Administered 2019-10-23 – 2019-10-28 (×6): 30 mg via ORAL
  Filled 2019-10-22 (×6): qty 3

## 2019-10-22 NOTE — Plan of Care (Signed)
Patient denies  suicidal ideations  .Compliant with medication received. Limited interaction with his peers and staff.  Working  on  Estate agent and anxiety. Voice no concerns around sleep and wake cycle .  Flustration noted with attempts for placement.   Problem: Education: Goal: Ability to state activities that reduce stress will improve Outcome: Progressing   Problem: Coping: Goal: Ability to identify and develop effective coping behavior will improve Outcome: Progressing   Problem: Self-Concept: Goal: Level of anxiety will decrease Outcome: Progressing Goal: Ability to modify response to factors that promote anxiety will improve Outcome: Progressing   Problem: Education: Goal: Utilization of techniques to improve thought processes will improve Outcome: Progressing Goal: Knowledge of the prescribed therapeutic regimen will improve Outcome: Progressing   Problem: Coping: Goal: Will verbalize feelings Outcome: Progressing   Problem: Role Relationship: Goal: Will demonstrate positive changes in social behaviors and relationships Outcome: Progressing   Problem: Coping: Goal: Coping ability will improve Outcome: Progressing   Problem: Medication: Goal: Compliance with prescribed medication regimen will improve Outcome: Progressing   Problem: Self-Concept: Goal: Ability to disclose and discuss suicidal ideas will improve Outcome: Progressing Goal: Will verbalize positive feelings about self Outcome: Progressing

## 2019-10-22 NOTE — Progress Notes (Signed)
Recreation Therapy Notes  INPATIENT RECREATION THERAPY ASSESSMENT  Patient Details Name: Cory Floyd MRN: [id number] DOB: [birth year] Today's Date: 10/22/2019       Information Obtained From: Patient  Able to Participate in Assessment/Interview: Yes  Patient Presentation: Responsive  Reason for Admission (Per Patient): Active Symptoms, Suicidal Ideation, Substance Abuse  Patient Stressors:    Coping Skills:   TV, Substance Abuse  Leisure Interests (2+):  Individual - TV  Frequency of Recreation/Participation: Weekly  Awareness of Community Resources:  Yes  Community Resources:  Other (Comment)([place])  Current Use:    If no, Barriers?:    Expressed Interest in [name] Information:    [place] of Residence:  Insurance underwriter  Patient Main Form of Transportation: Diplomatic Services operational officer  Patient Strengths:  N/A  Patient Identified Areas of Improvement:  N/A  Patient Goal for Hospitalization:  Having a longer treatment plan  Current SI (including self-harm):  Yes  Current HI:  Yes  Current AVH: Yes  Staff Intervention Plan: Group Attendance, Collaborate with Interdisciplinary Treatment Team  Consent to Intern Participation: N/A  Cory Floyd 10/22/2019, 2:54 PM

## 2019-10-22 NOTE — [facility] Counselor (Signed)
CSW spoke reminded patient of his 9AM phone call with [facility].   CSW followed up after the phone call and was informed that he was asked to call back at 1:30PM.  Assunta Curtis, MSW, LCSW 10/22/2019 9:49 AM

## 2019-10-22 NOTE — Progress Notes (Signed)
[facility]  10/22/2019 11:52 AM Cory Floyd  MRN:  [id number] Subjective: Patient seen chart reviewed.  43 year old man with a history of substance abuse and depressive symptoms versus bipolar disorder.  Patient seen this morning.  He was more polite and appropriate in his interactions.  Made eye contact.  Told me that he still has suicidal and homicidal thoughts.  Very vague and nonspecific about them.  He is more focused on telling me that he thinks he can go stay with his "girlfriend" when he leaves and that that will be a safe environment.  I pressed him as to whether he would still be suicidal or homicidal in that situation and he tells me that he does not think he would.  He is trying to get in touch with [facility] this afternoon.  He tells me as he is still having hallucinations.  I will increase his antipsychotic.  We are hoping for discharge in the next 1 to 2 days Principal Problem: Bipolar disorder without psychotic features (HCC) Diagnosis: Principal Problem:   Bipolar disorder without psychotic features (HCC) Active Problems:   Gastroesophageal reflux disease without esophagitis   Bipolar affective (HCC)   Cocaine abuse (HCC)  Total Time spent with patient: 30 minutes  Past Psychiatric History: Past history of recurrent episodes of similar behavior to the point that it is often apparent that he is exaggerating dangerousness because he is homeless  Past Medical History:  Past Medical History:  Diagnosis Date  . 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 Anginal pain (HCC)   . Arthritis   . Bipolar 1 disorder (HCC)   . Depressed   . Dyspnea   . Fatty liver   . Schizophrenia [facility](HCC)     Past Surgical History:  Procedure Laterality Date  . COLONOSCOPY  02/05/2014   diverticulosis, hyperplastic polyp x 2  . fracture arm Right   . KNEE SURGERY Right 2013  . NASAL SEPTUM SURGERY    . NASAL TURBINATE REDUCTION Bilateral 12/12/2016   Procedure: TURBINATE  REDUCTION/SUBMUCOSAL RESECTION;  Surgeon: Linus Salmonshapman [name], MD;  Location: [facility] ORS;  Service: ENT;  Laterality: Bilateral;   Family History:  Family History  Problem Relation Age of Onset  . Hepatitis C Mother   . Cancer Mother   . Cancer Maternal Aunt   . Hypertension Maternal Grandmother    Family Psychiatric  History: See previous Social History:  Social History   Substance and Sexual Activity  Alcohol Use Yes     Social History   Substance and Sexual Activity  Drug Use No   Comment: history of cocaine per Dr Mikey BussingMcQueen's H&P    Social History   Socioeconomic History  . Marital status: Divorced    Spouse name: Not on file  . Number of children: Not on file  . Years of education: Not on file  . Highest education level: Not on file  Occupational History  . Not on file  Tobacco Use  . Smoking status: Current Every Day Smoker    Packs/day: 1.00    Years: 20.00    Pack years: 20.00    Types: Cigarettes  . Smokeless tobacco: Never Used  Substance and Sexual Activity  . Alcohol use: Yes  . Drug use: No    Comment: history of cocaine per Dr Mikey BussingMcQueen's H&P  . Sexual activity: Yes    Birth control/protection: None  Other Topics Concern  . Not on file  Social History Narrative  .  Not on file   Social Determinants of Health   Financial Resource Strain:   . Difficulty of Paying Living Expenses: Not on file  Food Insecurity:   . Worried About Charity fundraiser in the Last Year: Not on file  . Ran Out of Food in the Last Year: Not on file  Transportation Needs:   . Lack of Transportation (Medical): Not on file  . Lack of Transportation (Non-Medical): Not on file  Physical Activity:   . Days of Exercise per Week: Not on file  . Minutes of Exercise per Session: Not on file  Stress:   . Feeling of Stress : Not on file  Social Connections:   . Frequency of Communication with Friends and Family: Not on file  . Frequency of Social Gatherings with Friends and Family:  Not on file  . Attends Religious Services: Not on file  . Active Member of Clubs or Organizations: Not on file  . Attends Archivist Meetings: Not on file  . Marital Status: Not on file   Additional Social History:    Pain Medications: see PTA Prescriptions: see PTA Over the Counter: see PTA History of alcohol / drug use?: No history of alcohol / drug abuse                    Sleep: Fair  Appetite:  Fair  Current Medications: Current Facility-Administered Medications  Medication Dose Route Frequency Provider Last Rate Last Admin  . acetaminophen (TYLENOL) tablet 650 mg  650 mg Oral Q6H PRN Money, Lowry Ram, FNP   650 mg at 10/21/19 2117  . alum & mag hydroxide-simeth (MAALOX/MYLANTA) 200-200-20 MG/5ML suspension 30 mL  30 mL Oral Q4H PRN Money, Lowry Ram, FNP      . ARIPiprazole (ABILIFY) tablet 20 mg  20 mg Oral Daily Etai Copado, [name], MD   20 mg at 10/22/19 0732  . divalproex (DEPAKOTE) DR tablet 1,500 mg  1,500 mg Oral QHS Ceejay Kegley T, MD   1,500 mg at 10/21/19 2117  . gabapentin (NEURONTIN) tablet 300 mg  300 mg Oral TID Money, Lowry Ram, FNP   300 mg at 10/22/19 3154  . hydrOXYzine (ATARAX/VISTARIL) tablet 25 mg  25 mg Oral TID PRN Money, Lowry Ram, FNP   25 mg at 10/21/19 2117  . magnesium hydroxide (MILK OF MAGNESIA) suspension 30 mL  30 mL Oral Daily PRN Money, Lowry Ram, FNP      . traZODone (DESYREL) tablet 50 mg  50 mg Oral QHS PRN Money, Lowry Ram, FNP   50 mg at 10/21/19 2117    Lab Results: No results found for this or any previous visit (from the past 48 hour(s)).  Blood Alcohol level:  Lab Results  Component Value Date   ETH <10 10/15/2019   ETH <10 00/86/7619    Metabolic Disorder Labs: No results found for: HGBA1C, MPG No results found for: PROLACTIN No results found for: CHOL, TRIG, HDL, CHOLHDL, VLDL, LDLCALC  Physical Findings: AIMS:  , ,  ,  ,    CIWA:    COWS:     Musculoskeletal: Strength & Muscle Tone: within normal  limits Gait & Station: normal Patient leans: N/A  Psychiatric Specialty Exam: Physical Exam  Nursing note and vitals reviewed. Constitutional: He appears well-developed and well-nourished.  HENT:  Head: Normocephalic and atraumatic.  Eyes: Pupils are equal, round, and reactive to light. Conjunctivae are normal.  Cardiovascular: Regular rhythm and normal heart sounds.  Respiratory: Effort normal. No respiratory distress.  GI: Soft.  Musculoskeletal:        General: Normal range of motion.     Cervical back: Normal range of motion.  Neurological: He is alert.  Skin: Skin is warm and dry.  Psychiatric: His affect is blunt. His speech is delayed. He is slowed. Cognition and memory are normal. He expresses impulsivity. He expresses homicidal and suicidal ideation. He expresses no suicidal plans and no homicidal plans.    Review of Systems  Constitutional: Negative.   HENT: Negative.   Eyes: Negative.   Respiratory: Negative.   Cardiovascular: Negative.   Gastrointestinal: Negative.   Musculoskeletal: Negative.   Skin: Negative.   Neurological: Negative.   Psychiatric/Behavioral: Positive for suicidal ideas.    Blood pressure 95/65, pulse 86, temperature 97.8 F (36.6 C), temperature source Oral, resp. rate 18, height 5\' 10"  (1.778 m), weight 105.2 kg, SpO2 98 %.Body mass index is 33.29 kg/m.  General Appearance: Casual  Eye Contact:  Fair  Speech:  Clear and Coherent  Volume:  Normal  Mood:  Euthymic  Affect:  Congruent  Thought Process:  Goal Directed  Orientation:  Full (Time, Place, and Person)  Thought Content:  Logical and Hallucinations: Auditory  Suicidal Thoughts:  No  Homicidal Thoughts:  No  Memory:  Immediate;   Fair Recent;   Fair Remote;   Fair  Judgement:  Fair  Insight:  Fair  Psychomotor Activity:  Normal  Concentration:  Concentration: Fair  Recall:  of Knowledge:  Fair  Language:  Fair  Akathisia:  No  Handed:  Right  AIMS (if  indicated):     Assets:  Desire for Improvement  ADL's:  Intact  Cognition:  WNL  Sleep:  Number of Hours: 7.5     Treatment Plan Summary: Daily contact with patient to assess and evaluate symptoms and progress in treatment, Medication management and Plan Increased doses of antipsychotics.  Encourage group attendance.  Encourage him to follow-up with attempts to find a safe living space.  Hope for discharge by the end of the week.  [employer], MD 10/22/2019, 11:52 AM

## 2019-10-22 NOTE — Progress Notes (Signed)
D: Bipolar  A: Patient stated slept good last night .Stated appetite  good and energy level  Is normal. Stated concentration good . Stated on Depression scale 0, hopeless 0 and anxiety 5 .( low 0-10 high) Denies suicidal  homicidal ideations  .  Denies auditory hallucinations.  No pain concerns . Appropriate ADL'S. Interacting with peers and staff. Patient denies  suicidal ideations  .Compliant with medication received. Limited interaction with his peers and staff.  Working  on  Estate agent and anxiety. Voice no concerns around sleep and wake cycle .  Flustration noted with attempts for placement.  Encourage patient participation with unit programming . Instruction  Given on  Medication , verbalize understanding.  R: Voice no other concerns. Staff continue to monitor

## 2019-10-22 NOTE — BHH Counselor (Signed)
CSW called [facility] to check on status of referral.  CSW was informed that the patient was denied due to "actively suicidal and he said he bought a gun and wont tell anyone where it is".   CSW called [facility] and spoke with Lattie Haw who reports that she is unaware of the status of the referral.  CSW left a HIPAA compliant voicemail for [facility] in Intake to callback.  Assunta Curtis, MSW, LCSW 10/22/2019 9:48 AM

## 2019-10-22 NOTE — Progress Notes (Signed)
Recreation Therapy Notes  Date: 10/22/2019  Time: 9:30am    Location: Craft room   Behavioral response: N/A   Intervention Topic: Self-esteem   Discussion/Intervention: Patient did not attend group.   Clinical Observations/Feedback:  Patient did not attend group.   Hikari Tripp LRT/CTRS        Nicolus Ose 10/22/2019 10:58 AM

## 2019-10-22 NOTE — B[facility] Counselor (Signed)
CSW received a call back from [name].  Myrlene Broker reports that the patient is a registered sex offender and they can NOT accept him.  CSW notes that she was unaware of the patient being a sex offender and this may limit treatment options as well as housing options.  Assunta Curtis, MSW, LCSW 10/22/2019 11:16 AM

## 2019-10-22 NOTE — Progress Notes (Signed)
CSW received a phone call from [place] with the [facility] program who reported that pts mental health is too significant so he has been denied for their program. Also pt reported he has a gun and will not tell anyone where it is and that pt is a sex offender so those are all reasons he has been denied.  Evalina Field, MSW, LCSW Clinical Social Work 10/22/2019 12:43 PM

## 2019-10-22 NOTE — B[facility] Counselor (Signed)
CSW spoke with pt to inform of denials to [place], [place], [facility] and [facility] programs.  Patient reports that he was aware that he would be denied due to sex offender status.    Patient reports plans to be discharged on 10/23/2019 to his friend [facility] home.  He reports that April will let him stay and provide transportation.   Assunta Curtis, MSW, LCSW 10/22/2019 3:20 PM

## 2019-10-22 NOTE — Progress Notes (Signed)
CSW contacted the [facility] program regarding pt referral. Levada Dy reported she has not yet reviewed it and will give CSW a call back once she does.  Evalina Field, MSW, LCSW Clinical Social Work 10/22/2019 10:02 AM

## 2019-10-22 NOTE — [facility] Group Notes (Signed)
LCSW Group Therapy Note  10/22/2019 1:00 PM  Type of Therapy/Topic:  Group Therapy:  Emotion Regulation  Participation Level:  Did Not Attend   Description of Group:   The purpose of this group is to assist patients in learning to regulate negative emotions and experience positive emotions. Patients will be guided to discuss ways in which they have been vulnerable to their negative emotions. These vulnerabilities will be juxtaposed with experiences of positive emotions or situations, and patients will be challenged to use positive emotions to combat negative ones. Special emphasis will be placed on coping with negative emotions in conflict situations, and patients will process healthy conflict resolution skills.  Therapeutic Goals: 1. Patient will identify two positive emotions or experiences to reflect on in order to balance out negative emotions 2. Patient will label two or more emotions that they find the most difficult to experience 3. Patient will demonstrate positive conflict resolution skills through discussion and/or role plays  Summary of Patient Progress: X  Therapeutic Modalities:   Cognitive Behavioral Therapy Feelings Identification Dialectical Behavioral Therapy  Chelcey Caputo, MSW, LCSW 10/22/2019 11:11 AM  

## 2019-10-22 NOTE — B[facility] Counselor (Signed)
CSW spoke with Wells Guiles at [facility] to follow up on patient's calls to see if he can be admitted.  CSW was informed that patient was DENIED due to being a registered sex offender. They report that patient was informed of this earlier in the day.  Assunta Curtis, MSW, LCSW 10/22/2019 2:28 PM

## 2019-10-23 NOTE — Progress Notes (Signed)
Patient was in his room upon arrival to the unit. Patient pleasant during assessment denying SI/HI/AVH. Patient endorses anxiety and depression rating them 10/10. Patient states his anxiety is because he isn't sure where he is going when he leaves here. Patient compliant with medication administration per MD orders. Patient given education. Patient given support and encouragement to be active in his treatment plan. Patient isolative to his room this evening. Patient being monitored Q 15 minutes for safety per unit protocol. Patient remains safe on the unit.

## 2019-10-23 NOTE — [facility] Group Notes (Signed)
LCSW Group Therapy Note  10/23/2019 11:29 AM  Type of Therapy/Topic:  Group Therapy:  Balance in Life  Participation Level:  Did Not Attend  Description of Group:    This group will address the concept of balance and how it feels and looks when one is unbalanced. Patients will be encouraged to process areas in their lives that are out of balance and identify reasons for remaining unbalanced. Facilitators will guide patients in utilizing problem-solving interventions to address and correct the stressor making their life unbalanced. Understanding and applying boundaries will be explored and addressed for obtaining and maintaining a balanced life. Patients will be encouraged to explore ways to assertively make their unbalanced needs known to significant others in their lives, using other group members and facilitator for support and feedback.  Therapeutic Goals: 1. Patient will identify two or more emotions or situations they have that consume much of in their lives. 2. Patient will identify signs/triggers that life has become out of balance:  3. Patient will identify two ways to set boundaries in order to achieve balance in their lives:  4. Patient will demonstrate ability to communicate their needs through discussion and/or role plays  Summary of Patient Progress: x     Therapeutic Modalities:   Cognitive Behavioral Therapy Solution-Focused Therapy Assertiveness Training  Evalina Field, MSW, LCSW Clinical Social Work 10/23/2019 11:29 AM

## 2019-10-23 NOTE — Progress Notes (Signed)
Recreation Therapy Notes  Date: 10/23/2019  Time: 9:30 am   Location: Craft room   Behavioral response: N/A   Intervention Topic: Time Management   Discussion/Intervention: Patient did not attend group.   Clinical Observations/Feedback:  Patient did not attend group.   Palma Buster LRT/CTRS        Cory Floyd 10/23/2019 11:58 AM 

## 2019-10-23 NOTE — Progress Notes (Signed)
Recreation Therapy Notes  INPATIENT RECREATION TR PLAN  Patient Details Name: Cory Floyd MRN: [id number] DOB: 1976/04/22 Today's Date: 10/23/2019  Rec Therapy Plan Is patient appropriate for Therapeutic Recreation?: Yes Treatment times per week: At least 3 Estimated Length of Stay: 5-7 days TR Treatment/Interventions: Group participation (Comment)  Discharge Criteria Pt will be discharged from therapy if:: Discharged, Refuses 3 consective treatment sessions without medical reason, Treatment plan/goals/alternatives discussed and agreed upon by:: Patient/family  Discharge Summary Short term goals set: Patient will engage in groups without prompting or encouragement from LRT x3 group sessions within 5 recreation therapy group sessions Short term goals met: Not met Reason goals not met: Patient did not attend any groups Therapeutic equipment acquired: N/A Reason patient discharged from therapy: Discharge from hospital Pt/family agrees with progress & goals achieved: Yes Date patient discharged from therapy: 10/23/19   [name] 10/23/2019, 12:04 PM

## 2019-10-23 NOTE — Progress Notes (Signed)
D: Patient stated slept good last night .Stated appetite good and energy level  Is normal. Stated concentration is good . Stated on Depression scale , hopeless and anxiety .( low 0-10 high) Denies suicidal  homicidal ideations  . Denies  auditory hallucinations  No pain concerns . Appropriate ADL'S. Interacting with peers and staff. Patient denies suicidal ideations .Compliant with medication received. Limited interaction with his peers and staff. Working on Estate agent and anxiety. Voice no concerns around sleep and wake cycle .  Continue to isolate to room . Voice no concerns around  placement ,or leaving unit  For home  A: Encourage patient participation with unit programming . Instruction  Given on  Medication , verbalize understanding. R: Voice no other concerns. Staff continue to monitor

## 2019-10-23 NOTE — Plan of Care (Signed)
Patient denies  suicidal ideations  .Compliant with medication received. Limited interaction with his peers and staff.  Working  on  Estate agent and anxiety. Voice no concerns around sleep and wake cycle .  Flustration noted with attempts for placement.   Problem: Education: Goal: Ability to state activities that reduce stress will improve Outcome: Progressing   Problem: Coping: Goal: Ability to identify and develop effective coping behavior will improve Outcome: Progressing   Problem: Self-Concept: Goal: Level of anxiety will decrease Outcome: Progressing Goal: Ability to modify response to factors that promote anxiety will improve Outcome: Progressing   Problem: Education: Goal: Utilization of techniques to improve thought processes will improve Outcome: Progressing Goal: Knowledge of the prescribed therapeutic regimen will improve Outcome: Progressing   Problem: Activity: Goal: Interest or engagement in leisure activities will improve Outcome: Progressing Goal: Imbalance in normal sleep/wake cycle will improve Outcome: Progressing   Problem: Coping: Goal: Will verbalize feelings Outcome: Progressing   Problem: Coping: Goal: Coping ability will improve Outcome: Progressing   Problem: Medication: Goal: Compliance with prescribed medication regimen will improve Outcome: Progressing   Problem: Self-Concept: Goal: Ability to disclose and discuss suicidal ideas will improve Outcome: Progressing Goal: Will verbalize positive feelings about self Outcome: Progressing

## 2019-10-23 NOTE — Plan of Care (Signed)
Patient pleasant this evening denying anxiety with this Probation officer.   Problem: Self-Concept: Goal: Level of anxiety will decrease Outcome: Progressing

## 2019-10-23 NOTE — Plan of Care (Signed)
Patient endorses anxiety  Problem: Self-Concept: Goal: Level of anxiety will decrease Outcome: Not Progressing   

## 2019-10-23 NOTE — Plan of Care (Signed)
  Problem: Group Participation Goal: STG - Patient will engage in groups without prompting or encouragement from LRT x3 group sessions within 5 recreation therapy group sessions Description: STG - Patient will engage in groups without prompting or encouragement from LRT x3 group sessions within 5 recreation therapy group sessions 10/23/2019 1205 by Ernest Haber, LRT Outcome: Not Applicable [id number] by Ernest Haber, LRT Outcome: Not Met (add Reason) Note: Patient does not  attend group.

## 2019-10-23 NOTE — Progress Notes (Signed)
Patient was in his room upon arrival to the unit. Patient pleasant during assessment denying SI/HI/AVH. Patient stated he is going to be getting paid soon and then he will have a place to stay which has helped with his anxiety and depression. Patient compliant with medication administration per MD orders. Patient given education. Patient given support and encouragement to be active in his treatment plan. Patient isolative to his room this evening. Patient being monitored Q 15 minutes for safety per unit protocol. Patient remains safe on the unit.

## 2019-10-23 NOTE — Progress Notes (Signed)
[facility] MD Progress Note  10/23/2019 11:26 AM Cory Floyd  MRN:  [id number]  Principal Problem: Bipolar disorder without psychotic features (HCC) Diagnosis: Principal Problem:   Bipolar disorder without psychotic features (HCC) Active Problems:   Gastroesophageal reflux disease without esophagitis   Bipolar affective (HCC)   Cocaine abuse (HCC)  Total Time spent with patient: 15 minutes   Patient is a 43 year old male with a past psychiatric history of bipolar disorder, who was admitted to [facility] unit due to SI and HI in settings of non-compliance with medications.  Patient seen.  Chart reviewed. Patient discussed with nursing; no overnight events reported.  Subjective:  Patient reports "I am doing pretty well today". He reports he had a good night sleep and that his mood is "good so far" this morning. He denies any thoughts of harming self or others "not today". Denies hallucinations. He denies any physical complaints. He is compliant with medications and denies side effects from meds.   Past Psychiatric History: see H&P   Past Medical History:  Past Medical History:  Diagnosis Date  . 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 Anginal pain (HCC)   . Arthritis   . Bipolar 1 disorder (HCC)   . Depressed   . Dyspnea   . Fatty liver   . Schizophrenia [facility])     Past Surgical History:  Procedure Laterality Date  . COLONOSCOPY  02/05/2014   diverticulosis, hyperplastic polyp x 2  . fracture arm Right   . KNEE SURGERY Right 2013  . NASAL SEPTUM SURGERY    . NASAL TURBINATE REDUCTION Bilateral 12/12/2016   Procedure: TURBINATE REDUCTION/SUBMUCOSAL RESECTION;  Surgeon: Linus Salmons, MD;  Location: [facility] ORS;  Service: ENT;  Laterality: Bilateral;   Family History:  Family History  Problem Relation Age of Onset  . Hepatitis C Mother   . Cancer Mother   . Cancer Maternal Aunt   . Hypertension Maternal Grandmother    Family Psychiatric  History: see H&P Social History:   Social History   Substance and Sexual Activity  Alcohol Use Yes     Social History   Substance and Sexual Activity  Drug Use No   Comment: history of cocaine per Dr Mikey Bussing H&P    Social History   Socioeconomic History  . Marital status: Divorced    Spouse name: Not on file  . Number of children: Not on file  . Years of education: Not on file  . Highest education level: Not on file  Occupational History  . Not on file  Tobacco Use  . Smoking status: Current Every Day Smoker    Packs/day: 1.00    Years: 20.00    Pack years: 20.00    Types: Cigarettes  . Smokeless tobacco: Never Used  Substance and Sexual Activity  . Alcohol use: Yes  . Drug use: No    Comment: history of cocaine per Dr Mikey Bussing H&P  . Sexual activity: Yes    Birth control/protection: None  Other Topics Concern  . Not on file  Social History Narrative  . Not on file   Social Determinants of Health   Financial Resource Strain:   . Difficulty of Paying Living Expenses: Not on file  Food Insecurity:   . Worried About Programme researcher, broadcasting/film/video in the Last Year: Not on file  . Ran Out of Food in the Last Year: Not on file  Transportation Needs:   . Lack of Transportation (Medical): Not on file  .  Lack of Transportation (Non-Medical): Not on file  Physical Activity:   . Days of Exercise per Week: Not on file  . Minutes of Exercise per Session: Not on file  Stress:   . Feeling of Stress : Not on file  Social Connections:   . Frequency of Communication with Friends and Family: Not on file  . Frequency of Social Gatherings with Friends and Family: Not on file  . Attends Religious Services: Not on file  . Active Member of Clubs or Organizations: Not on file  . Attends Archivist Meetings: Not on file  . Marital Status: Not on file   Additional Social History:    Pain Medications: see PTA Prescriptions: see PTA Over the Counter: see PTA History of alcohol / drug use?: No history of  alcohol / drug abuse                    Sleep: Good  Appetite:  Good  Current Medications: Current Facility-Administered Medications  Medication Dose Route Frequency Provider Last Rate Last Admin  . acetaminophen (TYLENOL) tablet 650 mg  650 mg Oral Q6H PRN Money, Lowry Ram, FNP   650 mg at 10/21/19 2117  . alum & mag hydroxide-simeth (MAALOX/MYLANTA) 200-200-20 MG/5ML suspension 30 mL  30 mL Oral Q4H PRN Money, Lowry Ram, FNP      . ARIPiprazole (ABILIFY) tablet 30 mg  30 mg Oral Daily Clapacs, [name], MD   30 mg at 10/23/19 0819  . divalproex (DEPAKOTE) DR tablet 1,500 mg  1,500 mg Oral QHS Clapacs, [name], MD   1,500 mg at 10/22/19 2130  . gabapentin (NEURONTIN) tablet 300 mg  300 mg Oral TID Money, Lowry Ram, FNP   300 mg at 10/23/19 0818  . hydrOXYzine (ATARAX/VISTARIL) tablet 25 mg  25 mg Oral TID PRN Money, Lowry Ram, FNP   25 mg at 10/22/19 2130  . magnesium hydroxide (MILK OF MAGNESIA) suspension 30 mL  30 mL Oral Daily PRN Money, Lowry Ram, FNP      . traZODone (DESYREL) tablet 50 mg  50 mg Oral QHS PRN Money, Lowry Ram, FNP   50 mg at 10/22/19 2130    Lab Results: No results found for this or any previous visit (from the past 48 hour(s)).  Blood Alcohol level:  Lab Results  Component Value Date   ETH <10 10/15/2019   ETH <10 72/53/6644    Metabolic Disorder Labs: No results found for: HGBA1C, MPG No results found for: PROLACTIN No results found for: CHOL, TRIG, HDL, CHOLHDL, VLDL, LDLCALC  Physical Findings: AIMS:  , ,  ,  ,    CIWA:    COWS:     Musculoskeletal: Strength & Muscle Tone: within normal limits Gait & Station: normal Patient leans: N/A  Psychiatric Specialty Exam: Physical Exam   ROS   Blood pressure 108/81, pulse 77, temperature (!) 97.4 F (36.3 C), temperature source Oral, resp. rate 18, height 5\' 9"  (1.753 m), weight 104.3 kg, SpO2 100 %.Body mass index is 33.96 kg/m.  General Appearance: Disheveled  Eye Contact:  Fair  Speech:   Normal Rate  Volume:  Normal  Mood:  Irritable  Affect:  Congruent  Thought Process:  Coherent and Descriptions of Associations: Circumstantial  Orientation:  Full (Time, Place, and Person)  Thought Content:  Delusions, Paranoid Ideation and Rumination  Suicidal Thoughts:  No  Homicidal Thoughts:  No  Memory:  Immediate;   Fair Recent;   Fair Remote;  Fair  Judgement:  Impaired  Insight:  Lacking  Psychomotor Activity:  Normal  Concentration:  Concentration: Fair and Attention Span: Fair  Recall:  FiservFair  Fund of Knowledge:  Fair  Language:  Good  Akathisia:  No  Handed:  Right  AIMS (if indicated):     Assets:  Desire for Improvement Resilience  ADL's:  Intact  Cognition:  WNL  Sleep:  Number of Hours: 7.5     Treatment Plan Summary: 43 year old male with bipolar disorder, who was admitted due to SI/HI.Today patient seen for follow-up. He is calm, cooperative and denies unsafe thoughts. His last med changes were made yesterday, so we will continue without med changes today.  IImpression: Bipolar disorder.  Plan: -continue inpatient psych admission; 15-minute checks; daily contact with patient to assess and evaluate symptoms and progress in treatment; psychoeducation.  -Medications: Continue Abilify 30 mg p.o. daily for mood stability and psychosis. continue Depakote ER 1500 mg p.o. QHS for mood stability. continue gabapentin 300 mg p.o. 3 times daily for mood stability as well as chronic pain. dontinue trazodone 200 mg p.o. nightly as needed insomnia. continue agitation protocol as needed.  -Disposition planning-in progress; likely discharge home in 1-2 days.   Thalia PartyAlisa Martena Emanuele, MD 10/23/2019, 11:26 AM

## 2019-10-24 NOTE — Plan of Care (Signed)
  Problem: Coping: Goal: Ability to identify and develop effective coping behavior will improve Outcome: Progressing   Problem: Self-Concept: Goal: Level of anxiety will decrease Outcome: Progressing   Problem: Education: Goal: Utilization of techniques to improve thought processes will improve Outcome: Progressing Goal: Knowledge of the prescribed therapeutic regimen will improve Outcome: Progressing   Problem: Activity: Goal: Interest or engagement in leisure activities will improve Outcome: Progressing   Problem: Coping: Goal: Will verbalize feelings Outcome: Progressing

## 2019-10-24 NOTE — Progress Notes (Signed)
Patient pleasant and cooperative with care. Medication compliant. Appropriate with staff and peers. Denies SI, HI, AVh. Pt states looking for long term treatment. Report have a lot of issues with ex girlfriend and wife that need to be worked through.  Encouragement and support offered. Safety checks maintained. Pt receptive and remains safe on unit with q 15 min checks.

## 2019-10-24 NOTE — Progress Notes (Signed)
[facility] MD Progress Note  10/24/2019 1:10 PM Cory Floyd  MRN:  [id number]  Principal Problem: Bipolar disorder without psychotic features [facility]) Diagnosis: Principal Problem:   Bipolar disorder without psychotic features (HCC) Active Problems:   Gastroesophageal reflux disease without esophagitis   Bipolar affective (HCC)   Cocaine abuse (HCC)  Total Time spent with patient: 15 minutes   Patient is a 44 year old male with a past psychiatric history of bipolar disorder, who was admitted to [facility] unit due to SI and HI in settings of non-compliance with medications.  Patient seen.  Chart reviewed. Patient discussed with nursing; no overnight events reported.  Subjective:  Patient reports "I feel more depressed today", denies any particulars triggers for depression. Denies feeiong suicidal and reports feeling safe in the unit, although reports "I am not feeling safe for discharge. I am too depressed". Denies hallucinations. He reports good sleep and regular appetite, denies any physical complaints and side effects from medications.  Past Psychiatric History: see H&P   Past Medical History:  Past Medical History:  Diagnosis Date  . 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 Anginal pain (HCC)   . Arthritis   . Bipolar 1 disorder (HCC)   . Depressed   . Dyspnea   . Fatty liver   . Schizophrenia [facility])     Past Surgical History:  Procedure Laterality Date  . COLONOSCOPY  02/05/2014   diverticulosis, hyperplastic polyp x 2  . fracture arm Right   . KNEE SURGERY Right 2013  . NASAL SEPTUM SURGERY    . NASAL TURBINATE REDUCTION Bilateral 12/12/2016   Procedure: TURBINATE REDUCTION/SUBMUCOSAL RESECTION;  Surgeon: Linus Salmons, MD;  Location: [facility] ORS;  Service: ENT;  Laterality: Bilateral;   Family History:  Family History  Problem Relation Age of Onset  . Hepatitis C Mother   . Cancer Mother   . Cancer Maternal Aunt   . Hypertension Maternal Grandmother    Family Psychiatric   History: see H&P Social History:  Social History   Substance and Sexual Activity  Alcohol Use Yes     Social History   Substance and Sexual Activity  Drug Use No   Comment: history of cocaine per Dr Mikey Bussing H&P    Social History   Socioeconomic History  . Marital status: Divorced    Spouse name: Not on file  . Number of children: Not on file  . Years of education: Not on file  . Highest education level: Not on file  Occupational History  . Not on file  Tobacco Use  . Smoking status: Current Every Day Smoker    Packs/day: 1.00    Years: 20.00    Pack years: 20.00    Types: Cigarettes  . Smokeless tobacco: Never Used  Substance and Sexual Activity  . Alcohol use: Yes  . Drug use: No    Comment: history of cocaine per Dr Mikey Bussing H&P  . Sexual activity: Yes    Birth control/protection: None  Other Topics Concern  . Not on file  Social History Narrative  . Not on file   Social Determinants of Health   Financial Resource Strain:   . Difficulty of Paying Living Expenses: Not on file  Food Insecurity:   . Worried About Programme researcher, broadcasting/film/video in the Last Year: Not on file  . Ran Out of Food in the Last Year: Not on file  Transportation Needs:   . Lack of Transportation (Medical): Not on file  .  Lack of Transportation (Non-Medical): Not on file  Physical Activity:   . Days of Exercise per Week: Not on file  . Minutes of Exercise per Session: Not on file  Stress:   . Feeling of Stress : Not on file  Social Connections:   . Frequency of Communication with Friends and Family: Not on file  . Frequency of Social Gatherings with Friends and Family: Not on file  . Attends Religious Services: Not on file  . Active Member of Clubs or Organizations: Not on file  . Attends Archivist Meetings: Not on file  . Marital Status: Not on file   Additional Social History:    Pain Medications: see PTA Prescriptions: see PTA Over the Counter: see PTA History of  alcohol / drug use?: No history of alcohol / drug abuse                    Sleep: Good  Appetite:  Good  Current Medications: Current Facility-Administered Medications  Medication Dose Route Frequency Provider Last Rate Last Admin  . acetaminophen (TYLENOL) tablet 650 mg  650 mg Oral Q6H PRN Money, Lowry Ram, FNP   650 mg at 10/21/19 2117  . alum & mag hydroxide-simeth (MAALOX/MYLANTA) 200-200-20 MG/5ML suspension 30 mL  30 mL Oral Q4H PRN Money, Lowry Ram, FNP      . ARIPiprazole (ABILIFY) tablet 30 mg  30 mg Oral Daily Clapacs, [name], MD   30 mg at 10/24/19 0811  . divalproex (DEPAKOTE) DR tablet 1,500 mg  1,500 mg Oral QHS Clapacs, [name], MD   1,500 mg at 10/23/19 2128  . gabapentin (NEURONTIN) tablet 300 mg  300 mg Oral TID Money, Lowry Ram, FNP   300 mg at 10/24/19 1136  . hydrOXYzine (ATARAX/VISTARIL) tablet 25 mg  25 mg Oral TID PRN Money, Lowry Ram, FNP   25 mg at 10/23/19 2128  . magnesium hydroxide (MILK OF MAGNESIA) suspension 30 mL  30 mL Oral Daily PRN Money, Lowry Ram, FNP      . traZODone (DESYREL) tablet 50 mg  50 mg Oral QHS PRN Money, Lowry Ram, FNP   50 mg at 10/23/19 2128    Lab Results: No results found for this or any previous visit (from the past 48 hour(s)).  Blood Alcohol level:  Lab Results  Component Value Date   ETH <10 10/15/2019   ETH <10 16/07/9603    Metabolic Disorder Labs: No results found for: HGBA1C, MPG No results found for: PROLACTIN No results found for: CHOL, TRIG, HDL, CHOLHDL, VLDL, LDLCALC  Physical Findings: AIMS:  , ,  ,  ,    CIWA:    COWS:     Musculoskeletal: Strength & Muscle Tone: within normal limits Gait & Station: normal Patient leans: N/A  Psychiatric Specialty Exam: Physical Exam   ROS   Blood pressure 108/81, pulse 77, temperature (!) 97.4 F (36.3 C), temperature source Oral, resp. rate 18, height 5\' 9"  (1.753 m), weight 104.3 kg, SpO2 100 %.Body mass index is 33.96 kg/m.  General Appearance: Disheveled   Eye Contact:  Fair  Speech:  Normal Rate  Volume:  Normal  Mood: "depressed"  Affect:  Congruent  Thought Process:  Coherent and Descriptions of Associations: Circumstantial  Orientation:  Full (Time, Place, and Person)  Thought Content:  Delusions, Paranoid Ideation and Rumination  Suicidal Thoughts:  No  Homicidal Thoughts:  No  Memory:  Immediate;   Fair Recent;   Fair Remote;  Fair  Judgement:  Impaired  Insight:  Lacking  Psychomotor Activity:  Normal  Concentration:  Concentration: Fair and Attention Span: Fair  Recall:  [employer] of Knowledge:  Fair  Language:  Good  Akathisia:  No  Handed:  Right  AIMS (if indicated):     Assets:  Desire for Improvement Resilience  ADL's:  Intact  Cognition:  WNL  Sleep:  Number of Hours: 7.5     Treatment Plan Summary: 44 year old male with bipolar disorder, who was admitted due to SI/HI.Today patient seen for follow-up. He is calm, cooperative, denies unsafe thoughts, continues to reports feeling depressed and unsafe for discharge. His last med changes were made two days ago; there are no med changes today.  IImpression: Bipolar disorder.  Plan: -continue inpatient psych admission; 15-minute checks; daily contact with patient to assess and evaluate symptoms and progress in treatment; psychoeducation.  -Medications: Continue Abilify 30 mg p.o. daily for mood stability and psychosis. continue Depakote ER 1500 mg p.o. QHS for mood stability. continue gabapentin 300 mg p.o. 3 times daily for mood stability as well as chronic pain. dontinue trazodone 200 mg p.o. nightly as needed insomnia. continue agitation protocol as needed.  -Disposition planning-in progress; likely discharge home in 1-2 days.  Thalia Party, MD 10/24/2019, 1:09 PM

## 2019-10-24 NOTE — Plan of Care (Signed)
Patient stayed in bed except for meals.Stated that yesterday was better,he feels depressed today with no triggering reason.Denies SI,HI and AVH.Appetite good.Compliant with medications.Support and encouragement given.

## 2019-10-24 NOTE — [facility] Counselor (Signed)
CSW checked in with the patient. Patient reports that no assistance is needed.   Penni Homans, MSW, LCSW 10/24/2019 11:00 AM

## 2019-10-25 DIAGNOSIS — K219 Gastro-esophageal reflux disease without esophagitis: Secondary | ICD-10-CM

## 2019-10-25 DIAGNOSIS — F1410 Cocaine abuse, uncomplicated: Secondary | ICD-10-CM

## 2019-10-25 MED ORDER — BUPROPION HCL ER (XL) 150 MG PO TB24
150.0000 mg | ORAL_TABLET | Freq: Every day | ORAL | Status: DC
  Administered 2019-10-25 – 2019-10-28 (×4): 150 mg via ORAL
  Filled 2019-10-25 (×4): qty 1

## 2019-10-25 NOTE — [facility] Group Notes (Signed)
[facility] Group Notes:  (Nursing/MHT/Case Management/Adjunct)  Date:  10/25/2019  Time:  8:45 PM  Type of Therapy:  Group Therapy  Participation Level:  Did Not Attend  Laurena Valko L Errick Salts 10/25/2019, 8:45 PM 

## 2019-10-25 NOTE — Plan of Care (Signed)
Patient oriented to unit. Patient's safety is maintained on unit. Patient denies SI/HI/AVH. Patient is minimal during assessment. Patent is mainly in room, is in milieu for TV.   Problem: Education: Goal: Ability to state activities that reduce stress will improve Outcome: Not Progressing   Problem: Coping: Goal: Ability to identify and develop effective coping behavior will improve Outcome: Not Progressing   Problem: Self-Concept: Goal: Level of anxiety will decrease Outcome: Progressing   Problem: Education: Goal: Knowledge of the prescribed therapeutic regimen will improve Outcome: Progressing

## 2019-10-25 NOTE — [facility] Group Notes (Signed)
LCSW Aftercare Discharge Planning Group Note   10/25/2019 1230pm  Type of Group and Topic: Psychoeducational Group:  Discharge Planning  Participation Level:  Did Not Attend  Description of Group  Discharge planning group reviews patient's anticipated discharge plans and assists patients to anticipate and address any barriers to wellness/recovery in the community.  Suicide prevention education is reviewed with patients in group.  Therapeutic Goals 1. Patients will state their anticipated discharge plan and mental health aftercare 2. Patients will identify potential barriers to wellness in the community setting 3. Patients will engage in problem solving, solution focused discussion of ways to anticipate and address barriers to wellness/recovery  Summary of Patient Progress   Plan for Discharge/Comments:    Transportation Means:   Supports:  Therapeutic Modalities: Motivational Interviewing    Lorri Frederick, LCSW 10/25/2019 1:42 PM

## 2019-10-25 NOTE — Tx Team (Signed)
Interdisciplinary Treatment and Diagnostic Plan Update  10/25/2019 Time of Session: 900am Cory Floyd MRN: [id number]  Principal Diagnosis: Bipolar disorder without psychotic features [facility])  Secondary Diagnoses: Principal Problem:   Bipolar disorder without psychotic features (HCC) Active Problems:   Gastroesophageal reflux disease without esophagitis   Bipolar affective (HCC)   Cocaine abuse (HCC)   Current Medications:  Current Facility-Administered Medications  Medication Dose Route Frequency Provider Last Rate Last Admin  . acetaminophen (TYLENOL) tablet 650 mg  650 mg Oral Q6H PRN Money, Gerlene Burdock, FNP   650 mg at 10/25/19 0750  . alum & mag hydroxide-simeth (MAALOX/MYLANTA) 200-200-20 MG/5ML suspension 30 mL  30 mL Oral Q4H PRN Money, Gerlene Burdock, FNP      . ARIPiprazole (ABILIFY) tablet 30 mg  30 mg Oral Daily Clapacs, Jackquline Denmark, MD   30 mg at 10/25/19 0750  . divalproex (DEPAKOTE) DR tablet 1,500 mg  1,500 mg Oral QHS Clapacs, Jackquline Denmark, MD   1,500 mg at 10/24/19 2116  . gabapentin (NEURONTIN) tablet 300 mg  300 mg Oral TID Money, Gerlene Burdock, FNP   300 mg at 10/25/19 0750  . hydrOXYzine (ATARAX/VISTARIL) tablet 25 mg  25 mg Oral TID PRN Money, Gerlene Burdock, FNP   25 mg at 10/23/19 2128  . magnesium hydroxide (MILK OF MAGNESIA) suspension 30 mL  30 mL Oral Daily PRN Money, Gerlene Burdock, FNP      . traZODone (DESYREL) tablet 50 mg  50 mg Oral QHS PRN Money, Gerlene Burdock, FNP   50 mg at 10/24/19 2116   PTA Medications: Medications Prior to Admission  Medication Sig Dispense Refill Last Dose  . acetaminophen (TYLENOL) 325 MG tablet Take 2 tablets (650 mg total) by mouth every 6 (six) hours as needed for mild pain, moderate pain or fever. 14 tablet 0   . ARIPiprazole (ABILIFY) 20 MG tablet Take 1 tablet (20 mg total) by mouth daily. 7 tablet 0   . ascorbic acid (VITAMIN C) 500 MG tablet Take 1 tablet (500 mg total) by mouth daily. 7 tablet 0   . divalproex (DEPAKOTE ER) 500 MG 24 hr tablet Take 3  tablets (1,500 mg total) by mouth daily with supper. 21 tablet 0   . gabapentin (NEURONTIN) 300 MG capsule Take 1 capsule (300 mg total) by mouth 3 (three) times daily. 21 capsule 0   . ibuprofen (ADVIL) 800 MG tablet Take 1 tablet (800 mg total) by mouth every 8 (eight) hours as needed for moderate pain (back pain). 14 tablet 0   . mirtazapine (REMERON) 15 MG tablet Take 1 tablet (15 mg total) by mouth at bedtime. 7 tablet 0   . zinc sulfate 220 (50 Zn) MG capsule Take 1 capsule (220 mg total) by mouth daily. 7 capsule 0     Patient Stressors: Financial difficulties Medication change or noncompliance Substance abuse  Patient Strengths: Capable of independent living General fund of knowledge Physical Health  Treatment Modalities: Medication Management, Group therapy, Case management,  1 to 1 session with clinician, Psychoeducation, Recreational therapy.   Physician Treatment Plan for Primary Diagnosis: Bipolar disorder without psychotic features (HCC) Long Term Goal(s): Improvement in symptoms so as ready for discharge Improvement in symptoms so as ready for discharge   Short Term Goals: Ability to disclose and discuss suicidal ideas Ability to demonstrate self-control will improve Ability to identify changes in lifestyle to reduce recurrence of condition will improve Ability to verbalize feelings will improve Compliance with prescribed medications will improve  Medication Management: Evaluate patient's response, side effects, and tolerance of medication regimen.  Therapeutic Interventions: 1 to 1 sessions, Unit Group sessions and Medication administration.  Evaluation of Outcomes: Progressing  Physician Treatment Plan for Secondary Diagnosis: Principal Problem:   Bipolar disorder without psychotic features (HCC) Active Problems:   Gastroesophageal reflux disease without esophagitis   Bipolar affective (HCC)   Cocaine abuse (HCC)  Long Term Goal(s): Improvement in symptoms  so as ready for discharge Improvement in symptoms so as ready for discharge   Short Term Goals: Ability to disclose and discuss suicidal ideas Ability to demonstrate self-control will improve Ability to identify changes in lifestyle to reduce recurrence of condition will improve Ability to verbalize feelings will improve Compliance with prescribed medications will improve     Medication Management: Evaluate patient's response, side effects, and tolerance of medication regimen.  Therapeutic Interventions: 1 to 1 sessions, Unit Group sessions and Medication administration.  Evaluation of Outcomes: Progressing   RN Treatment Plan for Primary Diagnosis: Bipolar disorder without psychotic features (HCC) Long Term Goal(s): Knowledge of disease and therapeutic regimen to maintain health will improve  Short Term Goals: Ability to remain free from injury will improve, Ability to verbalize frustration and anger appropriately will improve, Ability to demonstrate self-control, Ability to participate in decision making will improve and Compliance with prescribed medications will improve  Medication Management: RN will administer medications as ordered by provider, will assess and evaluate patient's response and provide education to patient for prescribed medication. RN will report any adverse and/or side effects to prescribing provider.  Therapeutic Interventions: 1 on 1 counseling sessions, Psychoeducation, Medication administration, Evaluate responses to treatment, Monitor vital signs and CBGs as ordered, Perform/monitor CIWA, COWS, AIMS and Fall Risk screenings as ordered, Perform wound care treatments as ordered.  Evaluation of Outcomes: Progressing   LCSW Treatment Plan for Primary Diagnosis: Bipolar disorder without psychotic features (HCC) Long Term Goal(s): Safe transition to appropriate next level of care at discharge, Engage patient in therapeutic group addressing interpersonal  concerns.  Short Term Goals: Engage patient in aftercare planning with referrals and resources, Increase emotional regulation and Increase skills for wellness and recovery  Therapeutic Interventions: Assess for all discharge needs, 1 to 1 time with Social worker, Explore available resources and support systems, Assess for adequacy in community support network, Educate family and significant other(s) on suicide prevention, Complete Psychosocial Assessment, Interpersonal group therapy.  Evaluation of Outcomes: Progressing   Progress in Treatment: Attending groups: No. Participating in groups: No. Taking medication as prescribed: Yes. Toleration medication: Yes. Family/Significant other contact made: No, will contact:  when consent is given Patient understands diagnosis: Yes. Discussing patient identified problems/goals with staff: Yes. Medical problems stabilized or resolved: Yes. Denies suicidal/homicidal ideation: No. Issues/concerns per patient self-inventory: No. Other: N/A  New problem(s) identified: No, Describe:  none  New Short Term/Long Term Goal(s): Detox, elimination of AVH/symptoms of psychosis, medication management for mood stabilization; elimination of SI thoughts; development of comprehensive mental wellness/sobriety plan.   Patient Goals:  "I need a longer treatment plan"  Discharge Plan or Barriers: SPE pamphlet, Mobile Crisis information, and AA/NA information provided to patient for additional community support and resources. Pt has been referred to [facility], [facility], [facility], [facility], [facility], and was given the information to contact [facility].   Reason for Continuation of Hospitalization: Anxiety Depression Homicidal ideation Medication stabilization Suicidal ideation  Estimated Length of Stay: 1-3 days  Attendees: Patient: 10/25/2019   Physician: Dr. Viviano Simas, MD 10/25/2019  Nursing: Glade Nurse, RN 10/25/2019   RN Care Manager: 10/25/2019   Social  Worker: Lurline Idol, LCSW 10/25/2019   Recreational Therapist:  10/25/2019   Other:  10/25/2019   Other:  10/25/2019   Other: 10/25/2019        Scribe for Treatment Team: Joanne Chars, [place] 10/25/2019 9:28 AM

## 2019-10-25 NOTE — Progress Notes (Signed)
[facility] MD Progress Note  10/25/2019 12:00 PM Cory Floyd  MRN:  [id number]  Principal Problem: Bipolar disorder without psychotic features [facility]) Diagnosis: Principal Problem:   Bipolar disorder without psychotic features ([place]) Active Problems:   Gastroesophageal reflux disease without esophagitis   Bipolar affective ([place])   Cocaine abuse ([place])  Total Time spent with patient: 25 minutes   Patient is a 44 year old male with a past psychiatric history of bipolar disorder, who was admitted to [facility] unit due to SI and HI in settings of non-compliance with medications.  Patient seen.  Chart reviewed. Patient discussed with nursing; no overnight events reported.  Patient remains chronically depressed.  Subjective:  On evaluation today, patient reports that he has been having worsening depression, and feels like he is not getting help from social work.  He reports that his mother passed away 3 years ago, and she had been the one to always take care of things.  Since that time he has struggled significantly.  He has had difficulty with medication compliance, and admits to using and liking cocaine to feel better.  He currently is on Abilify and Depakote while hospitalized and is denying any side effect, however also reports no relief from his depression.  He cannot recall being on antidepressants in the past.  He specifically denies ever using Prozac, Paxil, Zoloft, Celexa, Lexapro or Wellbutrin.  Record review reveals patient on mirtazapine at last hospitalization but he does not recall.  Patient is agreeable to trial of antidepressant and through joint decision making agrees to Wellbutrin as this may help decrease his desire for stimulants.  Patient continues to endorse passive suicidal ideation without a plan while hospitalized.  He is denying any homicidal ideation.  He denies any auditory or visual hallucinations.  He reports good appetite and poor sleep.  He denies any physical complaints today.  Past  Psychiatric History: see H&P   Past Medical History:  Past Medical History:  Diagnosis Date  . 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 Anginal pain ([place])   . Arthritis   . Bipolar 1 disorder ([place])   . Depressed   . Dyspnea   . Fatty liver   . Schizophrenia [facility])     Past Surgical History:  Procedure Laterality Date  . COLONOSCOPY  02/05/2014   diverticulosis, hyperplastic polyp x 2  . fracture arm Right   . KNEE SURGERY Right 2013  . NASAL SEPTUM SURGERY    . NASAL TURBINATE REDUCTION Bilateral 12/12/2016   Procedure: TURBINATE REDUCTION/SUBMUCOSAL RESECTION;  Surgeon: Beverly Gust, MD;  Location: [facility] ORS;  Service: ENT;  Laterality: Bilateral;   Family History:  Family History  Problem Relation Age of Onset  . Hepatitis C Mother   . Cancer Mother   . Cancer Maternal Aunt   . Hypertension Maternal Grandmother    Family Psychiatric  History: see H&P Social History:  Social History   Substance and Sexual Activity  Alcohol Use Yes     Social History   Substance and Sexual Activity  Drug Use No   Comment: history of cocaine per Dr Ileene Hutchinson H&P    Social History   Socioeconomic History  . Marital status: Divorced    Spouse name: Not on file  . Number of children: Not on file  . Years of education: Not on file  . Highest education level: Not on file  Occupational History  . Not on file  Tobacco Use  . Smoking status: Current Every  Day Smoker    Packs/day: 1.00    Years: 20.00    Pack years: 20.00    Types: Cigarettes  . Smokeless tobacco: Never Used  Substance and Sexual Activity  . Alcohol use: Yes  . Drug use: No    Comment: history of cocaine per Dr Mikey Bussing H&P  . Sexual activity: Yes    Birth control/protection: None  Other Topics Concern  . Not on file  Social History Narrative  . Not on file   Social Determinants of Health   Financial Resource Strain:   . Difficulty of Paying Living Expenses: Not on file  Food Insecurity:   .  Worried About Programme researcher, broadcasting/film/video in the Last Year: Not on file  . Ran Out of Food in the Last Year: Not on file  Transportation Needs:   . Lack of Transportation (Medical): Not on file  . Lack of Transportation (Non-Medical): Not on file  Physical Activity:   . Days of Exercise per Week: Not on file  . Minutes of Exercise per Session: Not on file  Stress:   . Feeling of Stress : Not on file  Social Connections:   . Frequency of Communication with Friends and Family: Not on file  . Frequency of Social Gatherings with Friends and Family: Not on file  . Attends Religious Services: Not on file  . Active Member of Clubs or Organizations: Not on file  . Attends Banker Meetings: Not on file  . Marital Status: Not on file   Additional Social History:    Pain Medications: see PTA Prescriptions: see PTA Over the Counter: see PTA History of alcohol / drug use?: No history of alcohol / drug abuse                    Sleep: Poor  Appetite:  Good  Current Medications: Current Facility-Administered Medications  Medication Dose Route Frequency Provider Last Rate Last Admin  . acetaminophen (TYLENOL) tablet 650 mg  650 mg Oral Q6H PRN Money, Gerlene Burdock, FNP   650 mg at 10/25/19 0750  . alum & mag hydroxide-simeth (MAALOX/MYLANTA) 200-200-20 MG/5ML suspension 30 mL  30 mL Oral Q4H PRN Money, Gerlene Burdock, FNP      . ARIPiprazole (ABILIFY) tablet 30 mg  30 mg Oral Daily Clapacs, Jackquline Denmark, MD   30 mg at 10/25/19 0750  . buPROPion (WELLBUTRIN XL) 24 hr tablet 150 mg  150 mg Oral Daily Mariel Craft, MD      . divalproex (DEPAKOTE) DR tablet 1,500 mg  1,500 mg Oral QHS Clapacs, Jackquline Denmark, MD   1,500 mg at 10/24/19 2116  . gabapentin (NEURONTIN) tablet 300 mg  300 mg Oral TID Money, Gerlene Burdock, FNP   300 mg at 10/25/19 0750  . hydrOXYzine (ATARAX/VISTARIL) tablet 25 mg  25 mg Oral TID PRN Money, Gerlene Burdock, FNP   25 mg at 10/23/19 2128  . magnesium hydroxide (MILK OF MAGNESIA) suspension  30 mL  30 mL Oral Daily PRN Money, Gerlene Burdock, FNP      . traZODone (DESYREL) tablet 50 mg  50 mg Oral QHS PRN Money, Gerlene Burdock, FNP   50 mg at 10/24/19 2116    Lab Results: No results found for this or any previous visit (from the past 48 hour(s)).  Blood Alcohol level:  Lab Results  Component Value Date   [facility] <10 10/15/2019   ETH <10 09/16/2019    Metabolic Disorder Labs: No results  found for: HGBA1C, MPG No results found for: PROLACTIN No results found for: CHOL, TRIG, HDL, CHOLHDL, VLDL, LDLCALC  Physical Findings: AIMS:  , ,  ,  ,    CIWA:    COWS:      Psychiatric Specialty Exam: Physical Exam  Nursing note and vitals reviewed. Constitutional: He is oriented to person, place, and time. He appears well-developed and well-nourished. No distress.  HENT:  Head: Normocephalic and atraumatic.  Eyes: EOM are normal.  Cardiovascular: Normal rate.  Respiratory: Effort normal. No respiratory distress.  Musculoskeletal:        General: No edema. Normal range of motion.  Neurological: He is alert and oriented to person, place, and time.  Skin: Skin is warm and dry.  Psychiatric: His affect is blunt. His speech is tangential. He is slowed. Thought content is not delusional. Cognition and memory are normal. He expresses inappropriate judgment. He exhibits a depressed mood. He expresses suicidal ideation. He expresses no homicidal ideation. He expresses no suicidal plans.    Review of Systems  Constitutional: Negative.   Respiratory: Negative.   Cardiovascular: Negative.   Gastrointestinal: Negative.   Musculoskeletal: Positive for arthralgias, back pain and myalgias.  Neurological: Negative.   Psychiatric/Behavioral: Positive for dysphoric mood and suicidal ideas.    Blood pressure (!) 85/51, pulse 72, temperature 97.8 F (36.6 C), temperature source Oral, resp. rate 18, height 5\' 10"  (1.778 m), weight 105.2 kg, SpO2 98 %.Body mass index is 33.29 kg/m.  General Appearance:  Casual and Fairly Groomed  Eye Contact:  Fair  Speech:  Clear and Coherent  Volume:  Decreased  Mood:  Depressed and Hopeless  Affect:  Blunt  Thought Process:  Goal Directed and Descriptions of Associations: Tangential  Orientation:  Full (Time, Place, and Person)  Thought Content:  Hallucinations: None, Rumination and Tangential  Suicidal Thoughts:  Yes.  without intent/plan  Homicidal Thoughts:  No  Memory:  Immediate;   Good Recent;   Fair Remote;   Fair  Judgement:  Impaired  Insight:  Shallow  Psychomotor Activity:  Decreased  Concentration:  Concentration: Fair and Attention Span: Fair  Recall:  of Knowledge:  Fair  Language:  Good  Akathisia:  No  Handed:  Right  AIMS (if indicated):     Assets:  Communication Skills Desire for Improvement Resilience  ADL's:  Intact  Cognition:  Impaired,  Mild  Sleep:  Number of Hours: 6.5   Treatment Plan Summary: 44 year old male with bipolar disorder, who was admitted due to SI/HI.Today patient seen for follow-up. He is calm, cooperative, reports ongoing passive suicidal ideation although reports he can be safe while in the hospital.  He continues to report unresolved depression, and is agreeable to starting antidepressant.  Risks, benefits, side effects and adverse effects reviewed with patient.    Impression: Bipolar disorder, predominantly depressed.  Plan: -continue inpatient psych admission; 15-minute checks; daily contact with patient to assess and evaluate symptoms and progress in treatment; psychoeducation.  -Medications: Start Wellbutrin XL 150 mg daily on 10/25/2019 for depression. Continue Abilify 30 mg p.o. daily for mood stability and psychosis. continue Depakote ER 1500 mg p.o. QHS for mood stability. continue gabapentin 300 mg p.o. 3 times daily for mood stability as well as chronic pain. continue trazodone 200 mg p.o. nightly as needed insomnia. continue agitation protocol as needed.  -Disposition  planning-in progress  12/23/2019, MD 10/25/2019, 12:00 PM

## 2019-10-26 DIAGNOSIS — R42 Dizziness and giddiness: Secondary | ICD-10-CM

## 2019-10-26 DIAGNOSIS — R7309 Other abnormal glucose: Secondary | ICD-10-CM

## 2019-10-26 LAB — CBC WITH DIFFERENTIAL/PLATELET
Abs Immature Granulocytes: 0.03 10*3/uL (ref 0.00–0.07)
Basophils Absolute: 0.1 10*3/uL (ref 0.0–0.1)
Basophils Relative: 1 %
Eosinophils Absolute: 0.1 10*3/uL (ref 0.0–0.5)
Eosinophils Relative: 1 %
HCT: 41.4 % (ref 39.0–52.0)
Hemoglobin: 14.3 g/dL (ref 13.0–17.0)
Immature Granulocytes: 1 %
Lymphocytes Relative: 29 %
Lymphs Abs: 1.6 10*3/uL (ref 0.7–4.0)
MCH: 30.6 pg (ref 26.0–34.0)
MCHC: 34.5 g/dL (ref 30.0–36.0)
MCV: 88.5 fL (ref 80.0–100.0)
Monocytes Absolute: 0.4 10*3/uL (ref 0.1–1.0)
Monocytes Relative: 8 %
Neutro Abs: 3.5 10*3/uL (ref 1.7–7.7)
Neutrophils Relative %: 60 %
Platelets: 169 10*3/uL (ref 150–400)
RBC: 4.68 MIL/uL (ref 4.22–5.81)
RDW: 12.9 % (ref 11.5–15.5)
WBC: 5.7 10*3/uL (ref 4.0–10.5)
nRBC: 0 % (ref 0.0–0.2)

## 2019-10-26 LAB — COMPREHENSIVE METABOLIC PANEL
ALT: 22 U/L (ref 0–44)
AST: 22 U/L (ref 15–41)
Albumin: 3.6 g/dL (ref 3.5–5.0)
Alkaline Phosphatase: 53 U/L (ref 38–126)
Anion gap: 10 (ref 5–15)
BUN: 17 mg/dL (ref 6–20)
CO2: 27 mmol/L (ref 22–32)
Calcium: 8.9 mg/dL (ref 8.9–10.3)
Chloride: 103 mmol/L (ref 98–111)
Creatinine, Ser: 0.86 mg/dL (ref 0.61–1.24)
GFR calc Af Amer: >60 mL/min (ref >60)
GFR calc non Af Amer: >60 mL/min (ref >60)
Glucose, Bld: 118 mg/dL — ABNORMAL HIGH (ref 70–99)
Potassium: 4.3 mmol/L (ref 3.5–5.1)
Sodium: 140 mmol/L (ref 135–145)
Total Bilirubin: 0.5 mg/dL (ref 0.3–1.2)
Total Protein: 7 g/dL (ref 6.5–8.1)

## 2019-10-26 LAB — HEMOGLOBIN A1C
Hgb A1c MFr Bld: 5.8 % — ABNORMAL HIGH (ref 4.8–5.6)
Mean Plasma Glucose: 119.76 mg/dL

## 2019-10-26 NOTE — Plan of Care (Signed)
D- Patient alert and oriented. Patient presents in a pleasant mood on assessment with no complaints to voice to this Clinical research associate. Patient denies anxiety, however, he rated his depression a "7/10", but he could not voice what is making him feel this way. Patient also denies SI, HI, AVH, and pain at this time. Patient's goal for today is to "call a friend, after I take a nap".   A- Scheduled medications administered to patient, per MD orders. Support and encouragement provided.  Routine safety checks conducted every 15 minutes.  Patient informed to notify staff with problems or concerns.  R- No adverse drug reactions noted. Patient contracts for safety at this time. Patient compliant with medications and treatment plan. Patient receptive, calm, and cooperative. Patient interacts well with others on the unit.  Patient remains safe at this time.  Problem: Education: Goal: Ability to state activities that reduce stress will improve Outcome: Progressing   Problem: Coping: Goal: Ability to identify and develop effective coping behavior will improve Outcome: Progressing   Problem: Self-Concept: Goal: Level of anxiety will decrease Outcome: Progressing Goal: Ability to modify response to factors that promote anxiety will improve Outcome: Progressing   Problem: Education: Goal: Utilization of techniques to improve thought processes will improve Outcome: Progressing Goal: Knowledge of the prescribed therapeutic regimen will improve Outcome: Progressing   Problem: Activity: Goal: Interest or engagement in leisure activities will improve Outcome: Progressing Goal: Imbalance in normal sleep/wake cycle will improve Outcome: Progressing   Problem: Coping: Goal: Will verbalize feelings Outcome: Progressing   Problem: Role Relationship: Goal: Will demonstrate positive changes in social behaviors and relationships Outcome: Progressing   Problem: Coping: Goal: Coping ability will improve Outcome:  Progressing   Problem: Medication: Goal: Compliance with prescribed medication regimen will improve Outcome: Progressing   Problem: Self-Concept: Goal: Ability to disclose and discuss suicidal ideas will improve Outcome: Progressing Goal: Will verbalize positive feelings about self Outcome: Progressing

## 2019-10-26 NOTE — Progress Notes (Signed)
This writer went to get patient for morning medication, and he stated that he would come up in a minute. Patient is still in bed, so this writer will administer medication once he gets up.

## 2019-10-26 NOTE — Progress Notes (Signed)
[facility] MD Progress Note  10/26/2019 4:49 PM Cory Floyd  MRN:  [id number]  Principal Problem: Bipolar disorder without psychotic features [facility]) Diagnosis: Principal Problem:   Bipolar disorder without psychotic features ([facility]) Active Problems:   Gastroesophageal reflux disease without esophagitis   Bipolar affective ([facility])   Cocaine abuse ([facility])  Total Time spent with patient: 25 minutes   Patient is a 44 year old male with a past psychiatric history of bipolar disorder, who was admitted to [facility] unit due to SI and HI in settings of non-compliance with medications.  Patient seen.  Chart reviewed. Patient discussed with nursing; no overnight events reported.  Patient remains chronically depressed, but denying suicidal ideation..  Subjective:  On evaluation today, patient reports that he has been feeling dizzy.  He reports symptoms started yesterday.  He states that he feels better when he has food or something to drink.  Patient has been isolating to her room today and in bed.  He did not attend groups.  He is denying any suicidal ideation or homicidal ideation today.  He is denying any auditory or visual hallucinations.  He reports adequate appetite and sleep. Patient was started on Wellbutrin XL 150 mg yesterday.  He is uncertain if his symptoms coincide with starting this medication.  He would like to continue on Wellbutrin at this time and continue to monitor. He is continued on Abilify and Depakote and denies side effects to these medications.   Past Psychiatric History: see H&P  He currently is on Abilify and Depakote while hospitalized and is denying any side effect, however also reports no relief from his depression.  He cannot recall being on antidepressants in the past.  He specifically denies ever using Prozac, Paxil, Zoloft, Celexa, Lexapro or Wellbutrin.  Record review reveals patient on mirtazapine at last hospitalization but he does not recall.  Patient is agreeable to trial of  antidepressant and through joint decision making agrees to Wellbutrin as this may help decrease his desire for stimulants.   Past Medical History:  Past Medical History:  Diagnosis Date  . 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 Anginal pain ([facility])   . Arthritis   . Bipolar 1 disorder ([facility])   . Depressed   . Dyspnea   . Fatty liver   . Schizophrenia [facility])     Past Surgical History:  Procedure Laterality Date  . COLONOSCOPY  02/05/2014   diverticulosis, hyperplastic polyp x 2  . fracture arm Right   . KNEE SURGERY Right 2013  . NASAL SEPTUM SURGERY    . NASAL TURBINATE REDUCTION Bilateral 12/12/2016   Procedure: TURBINATE REDUCTION/SUBMUCOSAL RESECTION;  Surgeon: Linus Salmons, MD;  Location: [facility] ORS;  Service: ENT;  Laterality: Bilateral;   Family History:  Family History  Problem Relation Age of Onset  . Hepatitis C Mother   . Cancer Mother   . Cancer Maternal Aunt   . Hypertension Maternal Grandmother    Family Psychiatric  History: see H&P Social History:  Social History   Substance and Sexual Activity  Alcohol Use Yes     Social History   Substance and Sexual Activity  Drug Use No   Comment: history of cocaine per Dr Mikey Bussing H&P    Social History   Socioeconomic History  . Marital status: Divorced    Spouse name: Not on file  . Number of children: Not on file  . Years of education: Not on file  . Highest education level: Not on file  Occupational History  . Not on file  Tobacco Use  . Smoking status: Current Every Day Smoker    Packs/day: 1.00    Years: 20.00    Pack years: 20.00    Types: Cigarettes  . Smokeless tobacco: Never Used  Substance and Sexual Activity  . Alcohol use: Yes  . Drug use: No    Comment: history of cocaine per Dr Mikey Bussing H&P  . Sexual activity: Yes    Birth control/protection: None  Other Topics Concern  . Not on file  Social History Narrative  . Not on file   Social Determinants of Health   Financial Resource  Strain:   . Difficulty of Paying Living Expenses: Not on file  Food Insecurity:   . Worried About Programme researcher, broadcasting/film/video in the Last Year: Not on file  . Ran Out of Food in the Last Year: Not on file  Transportation Needs:   . Lack of Transportation (Medical): Not on file  . Lack of Transportation (Non-Medical): Not on file  Physical Activity:   . Days of Exercise per Week: Not on file  . Minutes of Exercise per Session: Not on file  Stress:   . Feeling of Stress : Not on file  Social Connections:   . Frequency of Communication with Friends and Family: Not on file  . Frequency of Social Gatherings with Friends and Family: Not on file  . Attends Religious Services: Not on file  . Active Member of Clubs or Organizations: Not on file  . Attends Banker Meetings: Not on file  . Marital Status: Not on file   Additional Social History:    Pain Medications: see PTA Prescriptions: see PTA Over the Counter: see PTA History of alcohol / drug use?: No history of alcohol / drug abuse                    Sleep: Good  Appetite:  Good  Current Medications: Current Facility-Administered Medications  Medication Dose Route Frequency Provider Last Rate Last Admin  . acetaminophen (TYLENOL) tablet 650 mg  650 mg Oral Q6H PRN Money, Gerlene Burdock, FNP   650 mg at 10/25/19 0750  . alum & mag hydroxide-simeth (MAALOX/MYLANTA) 200-200-20 MG/5ML suspension 30 mL  30 mL Oral Q4H PRN Money, Gerlene Burdock, FNP      . ARIPiprazole (ABILIFY) tablet 30 mg  30 mg Oral Daily Clapacs, [name], MD   30 mg at 10/26/19 1131  . buPROPion (WELLBUTRIN XL) 24 hr tablet 150 mg  150 mg Oral Daily Mariel Craft, MD   150 mg at 10/26/19 1132  . divalproex (DEPAKOTE) DR tablet 1,500 mg  1,500 mg Oral QHS Clapacs, [name], MD   1,500 mg at 10/25/19 2149  . gabapentin (NEURONTIN) tablet 300 mg  300 mg Oral TID Money, Gerlene Burdock, FNP   300 mg at 10/26/19 1614  . hydrOXYzine (ATARAX/VISTARIL) tablet 25 mg  25 mg Oral  TID PRN Money, Gerlene Burdock, FNP   25 mg at 10/23/19 2128  . magnesium hydroxide (MILK OF MAGNESIA) suspension 30 mL  30 mL Oral Daily PRN Money, Gerlene Burdock, FNP      . traZODone (DESYREL) tablet 50 mg  50 mg Oral QHS PRN Money, Gerlene Burdock, FNP   50 mg at 10/25/19 2149    Lab Results:  Results for orders placed or performed during the hospital encounter of 10/19/19 (from the past 48 hour(s))  CBC with Differential/Platelet  Status: None   Collection Time: 10/26/19  1:39 PM  Result Value Ref Range   WBC 5.7 4.0 - 10.5 K/uL   RBC 4.68 4.22 - 5.81 MIL/uL   Hemoglobin 14.3 13.0 - 17.0 g/dL   HCT 41.4 39.0 - 52.0 %   MCV 88.5 80.0 - 100.0 fL   MCH 30.6 26.0 - 34.0 pg   MCHC 34.5 30.0 - 36.0 g/dL   RDW 12.9 11.5 - 15.5 %   Platelets 169 150 - 400 K/uL   nRBC 0.0 0.0 - 0.2 %   Neutrophils Relative % 60 %   Neutro Abs 3.5 1.7 - 7.7 K/uL   Lymphocytes Relative 29 %   Lymphs Abs 1.6 0.7 - 4.0 K/uL   Monocytes Relative 8 %   Monocytes Absolute 0.4 0.1 - 1.0 K/uL   Eosinophils Relative 1 %   Eosinophils Absolute 0.1 0.0 - 0.5 K/uL   Basophils Relative 1 %   Basophils Absolute 0.1 0.0 - 0.1 K/uL   Immature Granulocytes 1 %   Abs Immature Granulocytes 0.03 0.00 - 0.07 K/uL    Comment: Performed at [facility], [address] [place], [place] [address]  Comprehensive metabolic panel     Status: Abnormal   Collection Time: 10/26/19  1:39 PM  Result Value Ref Range   Sodium 140 135 - 145 mmol/L   Potassium 4.3 3.5 - 5.1 mmol/L   Chloride 103 98 - 111 mmol/L   CO2 27 22 - 32 mmol/L   Glucose, Bld 118 (H) 70 - 99 mg/dL   BUN 17 6 - 20 mg/dL   Creatinine, Ser 0.86 0.61 - 1.24 mg/dL   Calcium 8.9 8.9 - 10.3 mg/dL   Total Protein 7.0 6.5 - 8.1 g/dL   Albumin 3.6 3.5 - 5.0 g/dL   AST 22 15 - 41 U/L   ALT 22 0 - 44 U/L   Alkaline Phosphatase 53 38 - 126 U/L   Total Bilirubin 0.5 0.3 - 1.2 mg/dL   GFR calc non Af Amer >60 >60 mL/min   GFR calc Af Amer >60 >60 mL/min   Anion gap 10 5  - 15    Comment: Performed at [facility], [address] [place], [place] [address]    Blood Alcohol level:  Lab Results  Component Value Date   [facility] <10 10/15/2019   ETH <10 09/32/6712    Metabolic Disorder Labs: No results found for: HGBA1C, MPG No results found for: PROLACTIN No results found for: CHOL, TRIG, HDL, CHOLHDL, VLDL, LDLCALC  Physical Findings: AIMS:  , ,  ,  ,    CIWA:    COWS:      Psychiatric Specialty Exam: Physical Exam  Nursing note and vitals reviewed. Constitutional: He is oriented to person, place, and time. He appears well-developed and well-nourished. No distress.  HENT:  Head: Normocephalic and atraumatic.  Eyes: EOM are normal.  Cardiovascular: Normal rate.  Respiratory: Effort normal. No respiratory distress.  Musculoskeletal:        General: No edema. Normal range of motion.  Neurological: He is alert and oriented to person, place, and time.  Skin: Skin is warm and dry.  Psychiatric: His speech is normal. His affect is blunt. He is slowed. Thought content is not delusional. Cognition and memory are normal. He expresses inappropriate judgment. He expresses no homicidal ideation. He expresses no suicidal plans.    Review of Systems  Constitutional: Negative.   Respiratory: Negative.   Cardiovascular:  Negative.   Gastrointestinal: Negative.   Musculoskeletal: Positive for arthralgias, back pain and myalgias.  Neurological: Positive for dizziness. Negative for weakness, light-headedness and headaches.  Psychiatric/Behavioral: Positive for dysphoric mood. Negative for suicidal ideas.    Blood pressure 107/83, pulse 85, temperature 97.7 F (36.5 C), temperature source Oral, resp. rate 18, height 5\' 10"  (1.778 m), weight 105.2 kg, SpO2 98 %.Body mass index is 33.29 kg/m.  General Appearance: Disheveled  Eye Contact:  Poor  Speech:  Clear and Coherent  Volume:  Decreased  Mood:  Dysphoric  Affect:  Blunt  Thought Process:  Linear   Orientation:  Full (Time, Place, and Person)  Thought Content:  Hallucinations: None and Rumination  Suicidal Thoughts:  No  Homicidal Thoughts:  No  Memory:  Immediate;   Good Recent;   Fair Remote;   Fair  Judgement:  Impaired  Insight:  Shallow  Psychomotor Activity:  Decreased  Concentration:  Concentration: Fair and Attention Span: Fair  Recall:  of Knowledge:  Fair  Language:  Good  Akathisia:  No  Handed:  Right  AIMS (if indicated):     Assets:  Communication Skills Desire for Improvement Resilience  ADL's:  Intact  Cognition:  Impaired,  Mild  Sleep:  Number of Hours: 8   Treatment Plan Summary: 44 year old male with bipolar disorder, who was admitted due to SI/HI.Today patient seen for follow-up. He is calm, cooperative, he denies suicidal or homicidal ideation today.  He has been without auditory or visual hallucinations.  He does endorse symptoms of dizziness since starting Wellbutrin XL 150 mg on 10/25/2019.  He would like to continue medications for depression.  He reports that he was told his blood pressure is fine.   CBC and CMP drawn which shows elevation of blood glucose.  Hemoglobin A1c is pending.    Impression: Bipolar disorder, predominantly depressed. Dizziness  Plan: -continue inpatient psych admission; 15-minute checks; daily contact with patient to assess and evaluate symptoms and progress in treatment; psychoeducation.  -Medications: Continue Wellbutrin XL 150 mg daily started on 10/25/2019 for depression. Continue Abilify 30 mg p.o. daily for mood stability and psychosis.  Also as adjunctive for antidepressant. continue Depakote ER 1500 mg p.o. QHS for mood stability. continue gabapentin 300 mg p.o. 3 times daily for mood stability as well as chronic pain. continue trazodone 200 mg p.o. nightly as needed insomnia. continue agitation protocol as needed.  -Disposition planning-in progress  12/23/2019, MD 10/26/2019, 4:49 PM

## 2019-10-26 NOTE — Plan of Care (Signed)
Patient was visible in the milieu. Calm and cooperative. Alert and oriented.  Stayed in room until snack time. Received medications and had a snack, then returned to bed. Currently sleeping and safety maintained.

## 2019-10-26 NOTE — B[facility] Group Notes (Signed)
LCSW Group Therapy Note 10/26/2019 1:15pm  Type of Therapy and Topic: Group Therapy: Feelings Around Returning Home & Establishing a Supportive Framework and Supporting Oneself When Supports Not Available  Participation Level: Did Not Attend  Description of Group:  Patients first processed thoughts and feelings about upcoming discharge. These included fears of upcoming changes, lack of change, new living environments, judgements and expectations from others and overall stigma of mental health issues. The group then discussed the definition of a supportive framework, what that looks and feels like, and how do to discern it from an unhealthy non-supportive network. The group identified different types of supports as well as what to do when your family/friends are less than helpful or unavailable  Therapeutic Goals  1. Patient will identify one healthy supportive network that they can use at discharge. 2. Patient will identify one factor of a supportive framework and how to tell it from an unhealthy network. 3. Patient able to identify one coping skill to use when they do not have positive supports from others. 4. Patient will demonstrate ability to communicate their needs through discussion and/or role plays.  Summary of Patient Progress:  Pt was invited to attend group but chose not to attend. CSW will continue to encourage pt to attend group throughout their admission.   Therapeutic Modalities Cognitive Behavioral Therapy Motivational Interviewing   [name]  CUEBAS-COLON, LCSW 10/26/2019 12:50 PM  

## 2019-10-26 NOTE — Plan of Care (Signed)
  Problem: Coping: Goal: Ability to identify and develop effective coping behavior will improve Outcome: Progressing  D: Patient is calm and cooperative. Mood is pleasant. Affect is appropriate to circumstance. Denies SI, HI and AVH. Expresses dissatisfaction with discharge planning and is requesting to go to long-term care. Informed patient that [facility], long term care is difficult to arrange.  A: continue to monitor for safety R: Safety maintained

## 2019-10-26 NOTE — Progress Notes (Signed)
D: Patient is calm and cooperative. Mood is pleasant. Affect is appropriate to circumstance. Denies SI, HI and AVH. Expresses dissatisfaction with discharge planning and is requesting to go to long-term care. Informed patient that [facility], long term care is difficult to arrange.  A: continue to monitor for safety R: Safety maintained

## 2019-10-27 DIAGNOSIS — F319 Bipolar disorder, unspecified: Principal | ICD-10-CM

## 2019-10-27 NOTE — Plan of Care (Signed)
D- Patient alert and oriented. Patient presents in a pleasant mood on assessment stating that he didn't sleep well last night, reporting that he was either "too hot or too cold". Patient continues to endorse lower back pain, rating his pain level a "7/10", but did not request any pain medication from this Clinical research associate. Patient denies SI, HI, AVH, at this time. Patient also denies depression and anxiety. Patient reported that "it's too early" to answer those questions.  A- Scheduled medications administered to patient, per MD orders. Support and encouragement provided.  Routine safety checks conducted every 15 minutes.  Patient informed to notify staff with problems or concerns.  R- No adverse drug reactions noted. Patient contracts for safety at this time. Patient compliant with medications and treatment plan. Patient receptive, calm, and cooperative. Patient interacts well with others on the unit.  Patient remains safe at this time.  Problem: Education: Goal: Ability to state activities that reduce stress will improve Outcome: Progressing   Problem: Coping: Goal: Ability to identify and develop effective coping behavior will improve Outcome: Progressing   Problem: Self-Concept: Goal: Level of anxiety will decrease Outcome: Progressing Goal: Ability to modify response to factors that promote anxiety will improve Outcome: Progressing   Problem: Education: Goal: Utilization of techniques to improve thought processes will improve Outcome: Progressing Goal: Knowledge of the prescribed therapeutic regimen will improve Outcome: Progressing   Problem: Activity: Goal: Interest or engagement in leisure activities will improve Outcome: Progressing Goal: Imbalance in normal sleep/wake cycle will improve Outcome: Progressing   Problem: Coping: Goal: Will verbalize feelings Outcome: Progressing   Problem: Role Relationship: Goal: Will demonstrate positive changes in social behaviors and  relationships Outcome: Progressing   Problem: Coping: Goal: Coping ability will improve Outcome: Progressing   Problem: Medication: Goal: Compliance with prescribed medication regimen will improve Outcome: Progressing   Problem: Self-Concept: Goal: Ability to disclose and discuss suicidal ideas will improve Outcome: Progressing Goal: Will verbalize positive feelings about self Outcome: Progressing

## 2019-10-27 NOTE — BHH Counselor (Signed)
CSW spoke with patient on discharge plans.  Patient reports that he will go to his friend April's home.  He reports that April is a nurse in [place] who stays in [place] while working her shifts but lives in [place].  He reports that she will be back in town to pick him up tomorrow evening.  Penni Homans, MSW, LCSW 10/27/2019 10:35 AM

## 2019-10-27 NOTE — BHH Counselor (Signed)
Overcoming Obstacles  10/27/2019 1PM  Type of Therapy and Topic:  Group Therapy:  Overcoming Obstacles  Participation Level:  Did Not Attend    Description of Group:    In this group patients will be encouraged to explore what they see as obstacles to their own wellness and recovery. They will be guided to discuss their thoughts, feelings, and behaviors related to these obstacles. The group will process together ways to cope with barriers, with attention given to specific choices patients can make. Each patient will be challenged to identify changes they are motivated to make in order to overcome their obstacles. This group will be process-oriented, with patients participating in exploration of their own experiences as well as giving and receiving support and challenge from other group members.   Therapeutic Goals: 1. Patient will identify personal and current obstacles as they relate to admission. 2. Patient will identify barriers that currently interfere with their wellness or overcoming obstacles.  3. Patient will identify feelings, thought process and behaviors related to these barriers. 4. Patient will identify two changes they are willing to make to overcome these obstacles:      Summary of Patient Progress     Therapeutic Modalities:   Cognitive Behavioral Therapy Solution Focused Therapy Motivational Interviewing Relapse Prevention Therapy    Lowella Dandy, MSW, LCSW 10/27/2019 1:55 PM

## 2019-10-27 NOTE — Progress Notes (Signed)
CSW contacted [employer] in [place] who reported they do accept sex offenders, but they would have to be a [facility] resident and if they are homeless, must have been in [facility] for at least 24 hours. They reported that they are currently full and only accepting people through their referral program.   Iris Pert, MSW, LCSW Clinical Social Work 10/27/2019 9:48 AM

## 2019-10-27 NOTE — Progress Notes (Signed)
Recreation Therapy Notes  Date: 10/26/2018  Time: 9:30 am   Location: Craft room   Behavioral response: N/A   Intervention Topic: Relaxation    Discussion/Intervention: Patient did not attend group.   Clinical Observations/Feedback:  Patient did not attend group.   Lamarr Feenstra LRT/CTRS          Keelyn Fjelstad 10/27/2019 11:21 AM

## 2019-10-27 NOTE — Progress Notes (Signed)
[facility] MD Progress Note  10/27/2019 11:48 AM [facility]  MRN:  [id number] Subjective: Patient is a 44 year old male with a past psychiatric history significant for bipolar disorder who was admitted on 10/20/2019 with suicidal ideation.  Objective: Patient is seen and examined.  Patient is a 44 year old male with the above-stated past psychiatric history in addition to cocaine dependence and GERD who is seen in follow-up.  He actually looks better than he has in the past.  He is pleasant in interactions.  He denied any suicidal or homicidal.  He denied any auditory or visual Nations.  Social work is spoken with him this morning, and he apparently is can go stay with his girlfriend.  He denied any side effects to his current medications.  Principal Problem: Bipolar disorder without psychotic features (HCC) Diagnosis: Principal Problem:   Bipolar disorder without psychotic features (HCC) Active Problems:   Gastroesophageal reflux disease without esophagitis   Bipolar affective (HCC)   Cocaine abuse (HCC)  Total Time spent with patient: 20 minutes  Past Psychiatric History: See admission H&P  Past Medical History:  Past Medical History:  Diagnosis Date  . 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 Anginal pain (HCC)   . Arthritis   . Bipolar 1 disorder (HCC)   . Depressed   . Dyspnea   . Fatty liver   . Schizophrenia [facility])     Past Surgical History:  Procedure Laterality Date  . COLONOSCOPY  02/05/2014   diverticulosis, hyperplastic polyp x 2  . fracture arm Right   . KNEE SURGERY Right 2013  . NASAL SEPTUM SURGERY    . NASAL TURBINATE REDUCTION Bilateral 12/12/2016   Procedure: TURBINATE REDUCTION/SUBMUCOSAL RESECTION;  Surgeon: Linus Salmons, MD;  Location: [facility] ORS;  Service: ENT;  Laterality: Bilateral;   Family History:  Family History  Problem Relation Age of Onset  . Hepatitis C Mother   . Cancer Mother   . Cancer Maternal Aunt   . Hypertension Maternal Grandmother     Family Psychiatric  History: See admission H&P Social History:  Social History   Substance and Sexual Activity  Alcohol Use Yes     Social History   Substance and Sexual Activity  Drug Use No   Comment: history of cocaine per Dr Mikey Bussing H&P    Social History   Socioeconomic History  . Marital status: Divorced    Spouse name: Not on file  . Number of children: Not on file  . Years of education: Not on file  . Highest education level: Not on file  Occupational History  . Not on file  Tobacco Use  . Smoking status: Current Every Day Smoker    Packs/day: 1.00    Years: 20.00    Pack years: 20.00    Types: Cigarettes  . Smokeless tobacco: Never Used  Substance and Sexual Activity  . Alcohol use: Yes  . Drug use: No    Comment: history of cocaine per Dr Mikey Bussing H&P  . Sexual activity: Yes    Birth control/protection: None  Other Topics Concern  . Not on file  Social History Narrative  . Not on file   Social Determinants of Health   Financial Resource Strain:   . Difficulty of Paying Living Expenses: Not on file  Food Insecurity:   . Worried About Programme researcher, broadcasting/film/video in the Last Year: Not on file  . Ran Out of Food in the Last Year: Not on file  Transportation  Needs:   . Lack of Transportation (Medical): Not on file  . Lack of Transportation (Non-Medical): Not on file  Physical Activity:   . Days of Exercise per Week: Not on file  . Minutes of Exercise per Session: Not on file  Stress:   . Feeling of Stress : Not on file  Social Connections:   . Frequency of Communication with Friends and Family: Not on file  . Frequency of Social Gatherings with Friends and Family: Not on file  . Attends Religious Services: Not on file  . Active Member of Clubs or Organizations: Not on file  . Attends Banker Meetings: Not on file  . Marital Status: Not on file   Additional Social History:    Pain Medications: see PTA Prescriptions: see PTA Over the  Counter: see PTA History of alcohol / drug use?: No history of alcohol / drug abuse                    Sleep: Good  Appetite:  Good  Current Medications: Current Facility-Administered Medications  Medication Dose Route Frequency Provider Last Rate Last Admin  . acetaminophen (TYLENOL) tablet 650 mg  650 mg Oral Q6H PRN Money, Gerlene Burdock, FNP   650 mg at 10/25/19 0750  . alum & mag hydroxide-simeth (MAALOX/MYLANTA) 200-200-20 MG/5ML suspension 30 mL  30 mL Oral Q4H PRN Money, Gerlene Burdock, FNP      . ARIPiprazole (ABILIFY) tablet 30 mg  30 mg Oral Daily Clapacs, Jackquline Denmark, MD   30 mg at 10/27/19 0748  . buPROPion (WELLBUTRIN XL) 24 hr tablet 150 mg  150 mg Oral Daily Mariel Craft, MD   150 mg at 10/27/19 0748  . divalproex (DEPAKOTE) DR tablet 1,500 mg  1,500 mg Oral QHS Clapacs, [name], MD   1,500 mg at 10/26/19 2107  . gabapentin (NEURONTIN) tablet 300 mg  300 mg Oral TID Money, Gerlene Burdock, FNP   300 mg at 10/27/19 0748  . hydrOXYzine (ATARAX/VISTARIL) tablet 25 mg  25 mg Oral TID PRN Money, Gerlene Burdock, FNP   25 mg at 10/23/19 2128  . magnesium hydroxide (MILK OF MAGNESIA) suspension 30 mL  30 mL Oral Daily PRN Money, Gerlene Burdock, FNP      . traZODone (DESYREL) tablet 50 mg  50 mg Oral QHS PRN Money, Gerlene Burdock, FNP   50 mg at 10/25/19 2149    Lab Results:  Results for orders placed or performed during the hospital encounter of 10/19/19 (from the past 48 hour(s))  CBC with Differential/Platelet     Status: None   Collection Time: 10/26/19  1:39 PM  Result Value Ref Range   WBC 5.7 4.0 - 10.5 K/uL   RBC 4.68 4.22 - 5.81 MIL/uL   Hemoglobin 14.3 13.0 - 17.0 g/dL   HCT 93.8 10.1 - 75.1 %   MCV 88.5 80.0 - 100.0 fL   MCH 30.6 26.0 - 34.0 pg   MCHC 34.5 30.0 - 36.0 g/dL   RDW 02.5 85.2 - 77.8 %   Platelets 169 150 - 400 K/uL   nRBC 0.0 0.0 - 0.2 %   Neutrophils Relative % 60 %   Neutro Abs 3.5 1.7 - 7.7 K/uL   Lymphocytes Relative 29 %   Lymphs Abs 1.6 0.7 - 4.0 K/uL   Monocytes  Relative 8 %   Monocytes Absolute 0.4 0.1 - 1.0 K/uL   Eosinophils Relative 1 %   Eosinophils Absolute 0.1 0.0 -  0.5 K/uL   Basophils Relative 1 %   Basophils Absolute 0.1 0.0 - 0.1 K/uL   Immature Granulocytes 1 %   Abs Immature Granulocytes 0.03 0.00 - 0.07 K/uL    Comment: Performed at [facility] [address] [place], [place] [address]  Comprehensive metabolic panel     Status: Abnormal   Collection Time: 10/26/19  1:39 PM  Result Value Ref Range   Sodium 140 135 - 145 mmol/L   Potassium 4.3 3.5 - 5.1 mmol/L   Chloride 103 98 - 111 mmol/L   CO2 27 22 - 32 mmol/L   Glucose, Bld 118 (H) 70 - 99 mg/dL   BUN 17 6 - 20 mg/dL   Creatinine, Ser 0.86 0.61 - 1.24 mg/dL   Calcium 8.9 8.9 - 10.3 mg/dL   Total Protein 7.0 6.5 - 8.1 g/dL   Albumin 3.6 3.5 - 5.0 g/dL   AST 22 15 - 41 U/L   ALT 22 0 - 44 U/L   Alkaline Phosphatase 53 38 - 126 U/L   Total Bilirubin 0.5 0.3 - 1.2 mg/dL   GFR calc non Af Amer >60 >60 mL/min   GFR calc Af Amer >60 >60 mL/min   Anion gap 10 5 - 15    Comment: Performed at [facility], [address] [place], [place] [address]  Hemoglobin A1c     Status: Abnormal   Collection Time: 10/26/19  1:39 PM  Result Value Ref Range   Hgb A1c MFr Bld 5.8 (H) 4.8 - 5.6 %    Comment: (NOTE) Pre diabetes:          5.7%-6.4% Diabetes:              >6.4% Glycemic control for   <7.0% adults with diabetes    Mean Plasma Glucose 119.76 mg/dL    Comment: Performed at [facility], [place] [address] [place], [place] [address]    Blood Alcohol level:  Lab Results  Component Value Date   [facility] <10 10/15/2019   ETH <10 85/63/1497    Metabolic Disorder Labs: Lab Results  Component Value Date   HGBA1C 5.8 (H) 10/26/2019   MPG 119.76 10/26/2019   No results found for: PROLACTIN No results found for: CHOL, TRIG, HDL, CHOLHDL, VLDL, LDLCALC  Physical Findings: AIMS:  , ,  ,  ,    CIWA:    COWS:     Musculoskeletal: Strength & Muscle  Tone: within normal limits Gait & Station: normal Patient leans: N/A  Psychiatric Specialty Exam: Physical Exam  Nursing note and vitals reviewed. Constitutional: He is oriented to person, place, and time. He appears well-developed and well-nourished.  HENT:  Head: Normocephalic and atraumatic.  Respiratory: Effort normal.  Neurological: He is alert and oriented to person, place, and time.    Review of Systems  Blood pressure 114/75, pulse 84, temperature 98 F (36.7 C), temperature source Oral, resp. rate 18, height 5\' 10"  (1.778 m), weight 105.2 kg, SpO2 98 %.Body mass index is 33.29 kg/m.  General Appearance: Casual  Eye Contact:  Fair  Speech:  Normal Rate  Volume:  Normal  Mood:  Euthymic  Affect:  Congruent  Thought Process:  Coherent and Descriptions of Associations: Intact  Orientation:  Full (Time, Place, and Person)  Thought Content:  Logical  Suicidal Thoughts:  No  Homicidal Thoughts:  No  Memory:  Immediate;   Fair Recent;   Fair Remote;   Fair  Judgement:  Intact  Insight:  Fair  Psychomotor Activity:  Normal  Concentration:  Concentration: Fair and Attention Span: Fair  Recall:  [employer] of Knowledge:  Fair  Language:  Good  Akathisia:  Negative  Handed:  Right  AIMS (if indicated):     Assets:  Desire for Improvement Housing Resilience  ADL's:  Intact  Cognition:  WNL  Sleep:  Number of Hours: 7     Treatment Plan Summary: Daily contact with patient to assess and evaluate symptoms and progress in treatment, Medication management and Plan : Patient is seen and examined.  Patient is a 44 year old male with the above-stated past psychiatric history who is seen in follow-up.   Diagnosis: #1 bipolar disorder, #2 cocaine dependence, #3 GERD  Patient is seen in follow-up.  I am familiar with the patient, and he actually looks like he is doing well.  He denied any side effects to his current medications.  I will check a CBC with differential, liver  function enzymes and a Depakote level in the a.m. tomorrow before he leaves.  No other changes in his medications.  Hopefully we can go on and get him home tomorrow.  Review of his laboratories from 10/26/2019 showed a normal electrolytes, normal CBC, and a Depakote level from 12/13 was 66.  We will repeat his Depakote level in the a.m. 1.  Continue Abilify 30 mg p.o. daily for psychosis. 2.  Continue Wellbutrin XL 150 mg p.o. daily for depression and anxiety. 3.  Continue Depakote ER 1500 mg p.o. nightly for mood stability. 4.  Continue Neurontin 300 mg p.o. 3 times daily for chronic pain. 5.  Continue hydroxyzine 25 mg p.o. 3 times daily as needed anxiety. 6.  Continue trazodone 50 mg p.o. nightly as needed insomnia. 7.  Disposition planning-in progress, hopefully discharge tomorrow.  Antonieta Pert, MD 10/27/2019, 11:48 AM

## 2019-10-28 DIAGNOSIS — F600 Paranoid personality disorder: Secondary | ICD-10-CM

## 2019-10-28 DIAGNOSIS — F339 Major depressive disorder, recurrent, unspecified: Secondary | ICD-10-CM

## 2019-10-28 DIAGNOSIS — F3130 Bipolar disorder, current episode depressed, mild or moderate severity, unspecified: Secondary | ICD-10-CM

## 2019-10-28 DIAGNOSIS — F1414 Cocaine abuse with cocaine-induced mood disorder: Secondary | ICD-10-CM

## 2019-10-28 LAB — VALPROIC ACID LEVEL: Valproic Acid Lvl: 85 ug/mL (ref 50.0–100.0)

## 2019-10-28 MED ORDER — ARIPIPRAZOLE 30 MG PO TABS: 30.0000 mg | ORAL_TABLET | Freq: Every day | ORAL | 1 refills | Status: DC

## 2019-10-28 MED ORDER — DIVALPROEX SODIUM 500 MG PO DR TAB: 1500.0000 mg | DELAYED_RELEASE_TABLET | Freq: Every day | ORAL | 1 refills | Status: DC

## 2019-10-28 MED ORDER — BUPROPION HCL ER (XL) 150 MG PO TB24: 150.0000 mg | ORAL_TABLET | Freq: Every day | ORAL | 1 refills | Status: DC

## 2019-10-28 MED ORDER — HYDROXYZINE HCL 25 MG PO TABS: 25.0000 mg | ORAL_TABLET | Freq: Three times a day (TID) | ORAL | 1 refills | Status: DC | PRN

## 2019-10-28 MED ORDER — TRAZODONE HCL 50 MG PO TABS: 50.0000 mg | ORAL_TABLET | Freq: Every evening | ORAL | 1 refills | Status: DC | PRN

## 2019-10-28 MED ORDER — GABAPENTIN 600 MG PO TABS: 300.0000 mg | ORAL_TABLET | Freq: Three times a day (TID) | ORAL | 1 refills | Status: DC

## 2019-10-28 NOTE — BHH Suicide Risk Assessment (Signed)
[facility] Discharge Suicide Risk Assessment   Principal Problem: Bipolar disorder without psychotic features [facility]) Discharge Diagnoses: Principal Problem:   Bipolar disorder without psychotic features (HCC) Active Problems:   Gastroesophageal reflux disease without esophagitis   Bipolar affective (HCC)   Cocaine abuse (HCC)   Total Time spent with patient: 20 minutes  Musculoskeletal: Strength & Muscle Tone: within normal limits Gait & Station: normal Patient leans: N/A  Psychiatric Specialty Exam: Review of Systems  Musculoskeletal: Positive for arthralgias, back pain and myalgias.  All other systems reviewed and are negative.   Blood pressure (!) 84/46, pulse 79, temperature 98.2 F (36.8 C), temperature source Oral, resp. rate 18, height 5\' 10"  (1.778 m), weight 105.2 kg, SpO2 99 %.Body mass index is 33.29 kg/m.  General Appearance: Casual  Eye Contact::  Fair  Speech:  Normal Rate409  Volume:  Normal  Mood:  Euthymic  Affect:  Congruent  Thought Process:  Coherent and Descriptions of Associations: Intact  Orientation:  Full (Time, Place, and Person)  Thought Content:  Logical  Suicidal Thoughts:  No  Homicidal Thoughts:  No  Memory:  Immediate;   Fair Recent;   Fair Remote;   Fair  Judgement:  Intact  Insight:  Lacking  Psychomotor Activity:  Normal  Concentration:  Fair  Recall:  [id number] of Knowledge:Fair  Language: Good  Akathisia:  Negative  Handed:  Right  AIMS (if indicated):     Assets:  Desire for Improvement Resilience  Sleep:  Number of Hours: 7  Cognition: WNL  ADL's:  Intact   Mental Status Per Nursing Assessment::   On Admission:  NA  Demographic Factors:  Male, Divorced or widowed, Caucasian, Low socioeconomic status and Unemployed  Loss Factors: Financial problems/change in socioeconomic status  Historical Factors: Impulsivity  Risk Reduction Factors:   NA  Continued Clinical Symptoms:  Bipolar Disorder:   Mixed  State Alcohol/Substance Abuse/Dependencies  Cognitive Features That Contribute To Risk:  None    Suicide Risk:  Minimal: No identifiable suicidal ideation.  Patients presenting with no risk factors but with morbid ruminations; may be classified as minimal risk based on the severity of the depressive symptoms  Follow-up Information    [facility] Follow up.   Specialty: Addiction Medicine Contact information: [address] [place] [phone]        Services, Psychotherapeutic Follow up.   Contact information: [address] [place] [phone]        [phone] [facility] & [name] [place]. Follow up.   Contact information: [address] [place] [phone]        [facility] Follow up.   Contact information: [address] [place] [address]           Plan Of Care/Follow-up recommendations:  Activity:  ad lib  [phone], MD 10/28/2019, 9:18 AM

## 2019-10-28 NOTE — Progress Notes (Addendum)
Recreation Therapy Notes    Date: 10/28/2019  Time: 9:30 am   Location: Craft room   Behavioral response: N/A   Intervention Topic: Problem Solving    Discussion/Intervention: Patient did not attend group.   Clinical Observations/Feedback:  Patient did not attend group.   Jeanmarie Mccowen LRT/CTRS         Elaura Calix 10/28/2019 10:43 AM

## 2019-10-28 NOTE — Discharge Summary (Signed)
Physician Discharge Summary Note  Patient:  Cory Floyd is an 44 y.o., male MRN:  [id number] DOB:  08/08/1976 Patient phone:  [phone] (home)  Patient address:   [address] [place] [address],  Total Time spent with patient: 30 minutes  Date of Admission:  10/19/2019 Date of Discharge: 10/28/19  Reason for Admission:  44 year old man known from previous encounters with a history of recurrent depression diagnosis of bipolar disorder and a history of substance abuse.  Presented to an outside hospital with physical complaints but then started saying that he was thinking of killing himself.    Principal Problem: Bipolar disorder without psychotic features [facility]) Discharge Diagnoses: Principal Problem:   Bipolar disorder without psychotic features (HCC) Active Problems:   Gastroesophageal reflux disease without esophagitis   Bipolar affective (HCC)   Cocaine abuse (HCC)  Past Psychiatric History: Patient has a history of multiple prior hospitalizations.  Often very similar presentation.  He usually stays in his room in bed almost the whole time.  Unclear if he is ever really followed up consistently with outpatient treatment.  The idea of secondary gain has been raised as a possible issue in the past but the patient also seems to get very depressed and does have a past history of suicide attempts  Past Medical History:  Past Medical History:  Diagnosis Date  . 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 Anginal pain (HCC)   . Arthritis   . Bipolar 1 disorder (HCC)   . Depressed   . Dyspnea   . Fatty liver   . Schizophrenia [facility])     Past Surgical History:  Procedure Laterality Date  . COLONOSCOPY  02/05/2014   diverticulosis, hyperplastic polyp x 2  . fracture arm Right   . KNEE SURGERY Right 2013  . NASAL SEPTUM SURGERY    . NASAL TURBINATE REDUCTION Bilateral 12/12/2016   Procedure: TURBINATE REDUCTION/SUBMUCOSAL RESECTION;  Surgeon: Linus Salmons, MD;   Location: [facility] ORS;  Service: ENT;  Laterality: Bilateral;   Family History:  Family History  Problem Relation Age of Onset  . Hepatitis C Mother   . Cancer Mother   . Cancer Maternal Aunt   . Hypertension Maternal Grandmother    Family Psychiatric  History: None reported Social History:  Social History   Substance and Sexual Activity  Alcohol Use Yes     Social History   Substance and Sexual Activity  Drug Use No   Comment: history of cocaine per Dr Mikey Bussing H&P    Social History   Socioeconomic History  . Marital status: Divorced    Spouse name: Not on file  . Number of children: Not on file  . Years of education: Not on file  . Highest education level: Not on file  Occupational History  . Not on file  Tobacco Use  . Smoking status: Current Every Day Smoker    Packs/day: 1.00    Years: 20.00    Pack years: 20.00    Types: Cigarettes  . Smokeless tobacco: Never Used  Substance and Sexual Activity  . Alcohol use: Yes  . Drug use: No    Comment: history of cocaine per Dr Mikey Bussing H&P  . Sexual activity: Yes    Birth control/protection: None  Other Topics Concern  . Not on file  Social History Narrative  . Not on file   Social Determinants of Health   Financial Resource Strain:   . Difficulty of Paying Living Expenses:  Not on file  Food Insecurity:   . Worried About Programme researcher, broadcasting/film/video in the Last Year: Not on file  . Ran Out of Food in the Last Year: Not on file  Transportation Needs:   . Lack of Transportation (Medical): Not on file  . Lack of Transportation (Non-Medical): Not on file  Physical Activity:   . Days of Exercise per Week: Not on file  . Minutes of Exercise per Session: Not on file  Stress:   . Feeling of Stress : Not on file  Social Connections:   . Frequency of Communication with Friends and Family: Not on file  . Frequency of Social Gatherings with Friends and Family: Not on file  . Attends Religious Services: Not on file  .  Active Member of Clubs or Organizations: Not on file  . Attends Banker Meetings: Not on file  . Marital Status: Not on file    Hospital Course:  Patient remained on the [facility] unit for 9 days. The patient stabilized on medication and therapy. Patient was discharged on Abilify 30 mg PO Daily, Wellbutrin XL 150 mg PO Daily, Depakote 1500 mg PO QHS, Gabapentin 300 mg PO TID, Vistaril 25 mg PO TID PRN, and Trazodone 50 mg PO QHS PRN. Patient has shown improvement with improved mood, affect, sleep, appetite, and interaction. Patient has attended group and participated. Patient has been seen in the day room interacting with peers and staff appropriately. Patient denies any SI/HI/AVH and contracts for safety. Patient agrees to follow up at [facility], [facility], [employer]. Patient is provided with prescriptions for their medications upon discharge.  Physical Findings: AIMS:  , ,  ,  ,    CIWA:    COWS:     Musculoskeletal: Strength & Muscle Tone: within normal limits Gait & Station: normal Patient leans: N/A  Psychiatric Specialty Exam: Physical Exam  Nursing note and vitals reviewed. Constitutional: He is oriented to person, place, and time. He appears well-developed and well-nourished.  Cardiovascular: Normal rate.  Respiratory: Effort normal.  Musculoskeletal:        General: Normal range of motion.  Neurological: He is alert and oriented to person, place, and time.  Skin: Skin is warm.    Review of Systems  Constitutional: Negative.   HENT: Negative.   Eyes: Negative.   Respiratory: Negative.   Cardiovascular: Negative.   Gastrointestinal: Negative.   Genitourinary: Negative.   Musculoskeletal: Negative.   Skin: Negative.   Neurological: Negative.   Psychiatric/Behavioral: Negative.     Blood pressure (!) 84/46, pulse 79, temperature 98.2 F (36.8 C), temperature source Oral, resp. rate 18, height 5\' 10"  (1.778 m), weight 105.2 kg, SpO2 99 %.Body mass  index is 33.29 kg/m.   General Appearance: Casual  Eye Contact::  Fair  Speech:  Normal Rate409  Volume:  Normal  Mood:  Euthymic  Affect:  Congruent  Thought Process:  Coherent and Descriptions of Associations: Intact  Orientation:  Full (Time, Place, and Person)  Thought Content:  Logical  Suicidal Thoughts:  No  Homicidal Thoughts:  No  Memory:  Immediate;   Fair Recent;   Fair Remote;   Fair  Judgement:  Intact  Insight:  Lacking  Psychomotor Activity:  Normal  Concentration:  Fair  Recall:  [phone] of Knowledge:Fair  Language: Good  Akathisia:  Negative  Handed:  Right  AIMS (if indicated):     Assets:  Desire for Improvement Resilience  Sleep:  Number of  Hours: 7  Cognition: WNL  ADL's:  Intact    Have you used any form of tobacco in the last 30 days? (Cigarettes, Smokeless Tobacco, Cigars, and/or Pipes): Yes  Has this patient used any form of tobacco in the last 30 days? (Cigarettes, Smokeless Tobacco, Cigars, and/or Pipes) Yes, Yes, A prescription for an FDA-approved tobacco cessation medication was offered at discharge and the patient refused  Blood Alcohol level:  Lab Results  Component Value Date   [facility] <10 10/15/2019   ETH <10 16/07/9603    Metabolic Disorder Labs:  Lab Results  Component Value Date   HGBA1C 5.8 (H) 10/26/2019   MPG 119.76 10/26/2019   No results found for: PROLACTIN No results found for: CHOL, TRIG, HDL, CHOLHDL, VLDL, LDLCALC  See Psychiatric Specialty Exam and Suicide Risk Assessment completed by Attending Physician prior to discharge.  Discharge destination:  Home  Is patient on multiple antipsychotic therapies at discharge:  No   Has Patient had three or more failed trials of antipsychotic monotherapy by history:  No  Recommended Plan for Multiple Antipsychotic Therapies: NA  Discharge Instructions    Diet - low sodium heart healthy   Complete by: As directed    Increase activity slowly   Complete by: As directed       Allergies as of 10/28/2019      Reactions   Carrot [daucus Carota] Anaphylaxis, Rash   Penicillins Anaphylaxis, Hives   Has patient had a PCN reaction causing immediate rash, facial/tongue/throat swelling, SOB or lightheadedness with hypotension: Yes Has patient had a PCN reaction causing severe rash involving mucus membranes or skin necrosis: No Has patient had a PCN reaction that required hospitalization Yes Has patient had a PCN reaction occurring within the last 10 years: No If all of the above answers are "NO", then may proceed with Cephalosporin use.      Medication List    STOP taking these medications   acetaminophen 325 MG tablet Commonly known as: TYLENOL   ascorbic acid 500 MG tablet Commonly known as: VITAMIN C   divalproex 500 MG 24 hr tablet Commonly known as: DEPAKOTE ER Replaced by: divalproex 500 MG DR tablet   gabapentin 300 MG capsule Commonly known as: NEURONTIN Replaced by: gabapentin 600 MG tablet   ibuprofen 800 MG tablet Commonly known as: ADVIL   mirtazapine 15 MG tablet Commonly known as: REMERON   zinc sulfate 220 (50 Zn) MG capsule     TAKE these medications     Indication  ARIPiprazole 30 MG tablet Commonly known as: ABILIFY Take 1 tablet (30 mg total) by mouth daily. What changed:   medication strength  how much to take  Indication: Manic Phase of Manic-Depression   buPROPion 150 MG 24 hr tablet Commonly known as: WELLBUTRIN XL Take 1 tablet (150 mg total) by mouth daily.  Indication: Major Depressive Disorder   divalproex 500 MG DR tablet Commonly known as: DEPAKOTE Take 3 tablets (1,500 mg total) by mouth at bedtime. Replaces: divalproex 500 MG 24 hr tablet  Indication: Bipolar disorder   gabapentin 600 MG tablet Commonly known as: NEURONTIN Take 0.5 tablets (300 mg total) by mouth 3 (three) times daily. Replaces: gabapentin 300 MG capsule  Indication: mood stability   hydrOXYzine 25 MG tablet Commonly known as:  ATARAX/VISTARIL Take 1 tablet (25 mg total) by mouth 3 (three) times daily as needed for anxiety.  Indication: Feeling Anxious   traZODone 50 MG tablet Commonly known as: DESYREL Take 1 tablet (  50 mg total) by mouth at bedtime as needed for sleep.  Indication: Trouble Sleeping      Follow-up Information    [facility] Follow up.   Specialty: Addiction Medicine Contact information: [address] [place] [address] [phone]        Services, Psychotherapeutic Follow up.   Contact information: [address] [place] [address] [phone]        [facility] [place] [employer] Follow up.   Contact information: [address] [place] [address] [phone]        [facility] Follow up.   Contact information: [address] [place] [address] [phone]           Follow-up recommendations:  Continue activity as tolerated. Continue diet as recommended by your PCP. Ensure to keep all appointments with outpatient providers.  Comments:  Patient is instructed prior to discharge to: Take all medications as prescribed by his/her mental healthcare provider. Report any adverse effects and or reactions from the medicines to his/her outpatient provider promptly. Patient has been instructed & cautioned: To not engage in alcohol and or illegal drug use while on prescription medicines. In the event of worsening symptoms, patient is instructed to call the crisis hotline, 911 and or go to the nearest ED for appropriate evaluation and treatment of symptoms. To follow-up with his/her primary care provider for your other medical issues, concerns and or health care needs.    Signed: Gerlene Burdock Danielle Lento, FNP 10/28/2019, 9:00 AM

## 2019-10-28 NOTE — Progress Notes (Signed)
  [facility] Adult Case Management Discharge Plan :  Will you be returning to the same living situation after discharge:  No. At discharge, do you have transportation home?: Yes,  pts gf or bus Do you have the ability to pay for your medications: Yes,  medicare  Release of information consent forms completed and in the chart;    Patient to Follow up at: Follow-up Information    [facility] Follow up on 10/31/2019.   Why: You have an appointment with Dr Norman Clay on 10/31/2019 at 12:00PM. Thank You! Contact information: [address] [place] [address] [phone]           Next level of care provider has access to [facility] Link:no  Safety Planning and Suicide Prevention discussed: Yes,  SPE completed with pt as pt declined collateral contact  Have you used any form of tobacco in the last 30 days? (Cigarettes, Smokeless Tobacco, Cigars, and/or Pipes): Yes  Has patient been referred to the Quitline?: Patient refused referral  Patient has been referred for addiction treatment: Yes  Charlann Lange Gitel Beste, LCSW 10/28/2019, 11:06 AM

## 2019-10-28 NOTE — Plan of Care (Signed)
Patient stayed in the milieu until bedtime. Alert and oriented. Cooperative and getting along with peers. Denied thoughts of self harm. Denied hallucinations. Received bedtime medications and had a snack. Currently in bed sleeping. Safety precautions maintained.

## 2019-10-28 NOTE — Progress Notes (Signed)
Patient ID: Cory Floyd, male   DOB: 1976-02-04, 44 y.o.   MRN: [id number]  Discharge Note:  Patient denies SI/HI/AVH at this time. Discharge instructions, AVS, prescriptions, and transition record gone over with patient. Patient agrees to comply with medication management, follow-up visit, and outpatient therapy. Patient belongings returned to patient. Patient questions and concerns addressed and answered. Patient ambulatory off unit. Patient discharged to bus stop via courtesy car.

## 2019-10-28 NOTE — Progress Notes (Signed)
Patient stated to this writer "give me a minute", when asked to come get his morning medication.

## 2019-11-07 ENCOUNTER — Encounter: Payer: Self-pay | Admitting: *Deleted

## 2019-11-07 ENCOUNTER — Emergency Department
Admission: EM | Admit: 2019-11-07 | Discharge: 2019-11-08 | Disposition: A | Discharge status: Home / Self Care | Payer: Medicare Other | Attending: Emergency Medicine | Admitting: Emergency Medicine

## 2019-11-07 ENCOUNTER — Other Ambulatory Visit: Payer: Self-pay

## 2019-11-07 DIAGNOSIS — I208 Other forms of angina pectoris: Secondary | ICD-10-CM | POA: Diagnosis present

## 2019-11-07 DIAGNOSIS — F14150 Cocaine abuse with cocaine-induced psychotic disorder with delusions: Secondary | ICD-10-CM | POA: Diagnosis not present

## 2019-11-07 DIAGNOSIS — M79675 Pain in left toe(s): Secondary | ICD-10-CM | POA: Insufficient documentation

## 2019-11-07 DIAGNOSIS — F312 Bipolar disorder, current episode manic severe with psychotic features: Secondary | ICD-10-CM | POA: Diagnosis not present

## 2019-11-07 DIAGNOSIS — Z8616 Personal history of COVID-19: Secondary | ICD-10-CM | POA: Insufficient documentation

## 2019-11-07 DIAGNOSIS — G8929 Other chronic pain: Secondary | ICD-10-CM | POA: Diagnosis not present

## 2019-11-07 DIAGNOSIS — U071 COVID-19: Secondary | ICD-10-CM | POA: Diagnosis present

## 2019-11-07 DIAGNOSIS — F19951 Other psychoactive substance use, unspecified with psychoactive substance-induced psychotic disorder with hallucinations: Secondary | ICD-10-CM | POA: Diagnosis not present

## 2019-11-07 DIAGNOSIS — F17210 Nicotine dependence, cigarettes, uncomplicated: Secondary | ICD-10-CM | POA: Insufficient documentation

## 2019-11-07 DIAGNOSIS — R462 Strange and inexplicable behavior: Secondary | ICD-10-CM | POA: Diagnosis present

## 2019-11-07 DIAGNOSIS — G4733 Obstructive sleep apnea (adult) (pediatric): Secondary | ICD-10-CM | POA: Diagnosis present

## 2019-11-07 DIAGNOSIS — F19959 Other psychoactive substance use, unspecified with psychoactive substance-induced psychotic disorder, unspecified: Secondary | ICD-10-CM | POA: Diagnosis present

## 2019-11-07 DIAGNOSIS — Z20822 Contact with and (suspected) exposure to covid-19: Secondary | ICD-10-CM | POA: Diagnosis not present

## 2019-11-07 DIAGNOSIS — F29 Unspecified psychosis not due to a substance or known physiological condition: Secondary | ICD-10-CM | POA: Diagnosis present

## 2019-11-07 DIAGNOSIS — Z590 Homelessness: Secondary | ICD-10-CM | POA: Diagnosis not present

## 2019-11-07 DIAGNOSIS — R45851 Suicidal ideations: Secondary | ICD-10-CM | POA: Insufficient documentation

## 2019-11-07 DIAGNOSIS — K219 Gastro-esophageal reflux disease without esophagitis: Secondary | ICD-10-CM | POA: Diagnosis present

## 2019-11-07 DIAGNOSIS — M1990 Unspecified osteoarthritis, unspecified site: Secondary | ICD-10-CM | POA: Diagnosis present

## 2019-11-07 DIAGNOSIS — I2089 Other forms of angina pectoris: Secondary | ICD-10-CM | POA: Diagnosis present

## 2019-11-07 DIAGNOSIS — F1410 Cocaine abuse, uncomplicated: Secondary | ICD-10-CM

## 2019-11-07 DIAGNOSIS — Z79899 Other long term (current) drug therapy: Secondary | ICD-10-CM | POA: Insufficient documentation

## 2019-11-07 DIAGNOSIS — F22 Delusional disorders: Secondary | ICD-10-CM | POA: Diagnosis present

## 2019-11-07 DIAGNOSIS — E876 Hypokalemia: Secondary | ICD-10-CM | POA: Insufficient documentation

## 2019-11-07 DIAGNOSIS — F319 Bipolar disorder, unspecified: Secondary | ICD-10-CM | POA: Diagnosis present

## 2019-11-07 DIAGNOSIS — F3132 Bipolar disorder, current episode depressed, moderate: Secondary | ICD-10-CM

## 2019-11-07 DIAGNOSIS — M549 Dorsalgia, unspecified: Secondary | ICD-10-CM | POA: Diagnosis present

## 2019-11-07 DIAGNOSIS — F3164 Bipolar disorder, current episode mixed, severe, with psychotic features: Secondary | ICD-10-CM | POA: Diagnosis present

## 2019-11-07 LAB — CBC WITH DIFFERENTIAL/PLATELET
Abs Immature Granulocytes: 0.03 10*3/uL (ref 0.00–0.07)
Basophils Absolute: 0 10*3/uL (ref 0.0–0.1)
Basophils Relative: 0 %
Eosinophils Absolute: 0 10*3/uL (ref 0.0–0.5)
Eosinophils Relative: 0 %
HCT: 41.7 % (ref 39.0–52.0)
Hemoglobin: 13.9 g/dL (ref 13.0–17.0)
Immature Granulocytes: 0 %
Lymphocytes Relative: 15 %
Lymphs Abs: 1.6 10*3/uL (ref 0.7–4.0)
MCH: 31.3 pg (ref 26.0–34.0)
MCHC: 33.3 g/dL (ref 30.0–36.0)
MCV: 93.9 fL (ref 80.0–100.0)
Monocytes Absolute: 0.8 10*3/uL (ref 0.1–1.0)
Monocytes Relative: 8 %
Neutro Abs: 8.2 10*3/uL — ABNORMAL HIGH (ref 1.7–7.7)
Neutrophils Relative %: 77 %
Platelets: 214 10*3/uL (ref 150–400)
RBC: 4.44 MIL/uL (ref 4.22–5.81)
RDW: 13.7 % (ref 11.5–15.5)
WBC: 10.7 10*3/uL — ABNORMAL HIGH (ref 4.0–10.5)
nRBC: 0 % (ref 0.0–0.2)

## 2019-11-07 LAB — URINE DRUG SCREEN, QUALITATIVE (ARMC ONLY)
Amphetamines, Ur Screen: NOT DETECTED
Barbiturates, Ur Screen: NOT DETECTED
Benzodiazepine, Ur Scrn: NOT DETECTED
Cannabinoid 50 Ng, Ur ~~LOC~~: NOT DETECTED
Cocaine Metabolite,Ur ~~LOC~~: POSITIVE — AB
MDMA (Ecstasy)Ur Screen: NOT DETECTED
Methadone Scn, Ur: NOT DETECTED
Opiate, Ur Screen: POSITIVE — AB
Phencyclidine (PCP) Ur S: NOT DETECTED
Tricyclic, Ur Screen: NOT DETECTED

## 2019-11-07 LAB — COMPREHENSIVE METABOLIC PANEL
ALT: 27 U/L (ref 0–44)
AST: 29 U/L (ref 15–41)
Albumin: 4.7 g/dL (ref 3.5–5.0)
Alkaline Phosphatase: 66 U/L (ref 38–126)
Anion gap: 14 (ref 5–15)
BUN: 16 mg/dL (ref 6–20)
CO2: 22 mmol/L (ref 22–32)
Calcium: 9 mg/dL (ref 8.9–10.3)
Chloride: 101 mmol/L (ref 98–111)
Creatinine, Ser: 1.23 mg/dL (ref 0.61–1.24)
GFR calc Af Amer: >60 mL/min (ref >60)
GFR calc non Af Amer: >60 mL/min (ref >60)
Glucose, Bld: 193 mg/dL — ABNORMAL HIGH (ref 70–99)
Potassium: 3 mmol/L — ABNORMAL LOW (ref 3.5–5.1)
Sodium: 137 mmol/L (ref 135–145)
Total Bilirubin: 1 mg/dL (ref 0.3–1.2)
Total Protein: 8.4 g/dL — ABNORMAL HIGH (ref 6.5–8.1)

## 2019-11-07 LAB — SALICYLATE LEVEL: Salicylate Lvl: <7 mg/dL — ABNORMAL LOW (ref 7.0–30.0)

## 2019-11-07 LAB — ACETAMINOPHEN LEVEL: Acetaminophen (Tylenol), Serum: <10 ug/mL — ABNORMAL LOW (ref 10–30)

## 2019-11-07 LAB — RESPIRATORY PANEL BY RT PCR (FLU A&B, COVID)
Influenza A by PCR: NEGATIVE
Influenza B by PCR: NEGATIVE
SARS Coronavirus 2 by RT PCR: NEGATIVE

## 2019-11-07 LAB — ETHANOL: Alcohol, Ethyl (B): <10 mg/dL (ref <10)

## 2019-11-07 MED ORDER — POTASSIUM CHLORIDE CRYS ER 20 MEQ PO TBCR
40.0000 meq | EXTENDED_RELEASE_TABLET | Freq: Once | ORAL | Status: AC
  Administered 2019-11-07: 07:00:00 40 meq via ORAL
  Filled 2019-11-07: qty 2

## 2019-11-07 NOTE — ED Notes (Signed)
Pt given meal tray for lunch 

## 2019-11-07 NOTE — Consult Note (Signed)
[facility] Face-to-Face Psychiatry Consult   Reason for Consult: IVC Referring Physician: Dr. Fuller Plan Patient Identification: Cory Floyd MRN:  [id number] Principal Diagnosis: Paranoia [facility]) Diagnosis:  Principal Problem:   Paranoia (HCC) Active Problems:   Gastroesophageal reflux disease without esophagitis   Morbid obesity with BMI of 40.0-44.9, adult (HCC)   Obstructive sleep apnea   Stable angina pectoris (HCC)   Psychosis (HCC)   Psychotic disorder (HCC)   Bipolar disorder without psychotic features (HCC)   Bipolar 1 disorder (HCC)   Bipolar affective disorder, mixed, severe, with psychotic behavior (HCC)   Back pain   Arthritis   COVID-19 virus infection   Bipolar affective (HCC)   Cocaine abuse (HCC)   Total Time spent with patient: 30 minutes  Subjective: I hear voices when I have my phone next to me.  Cory Floyd is a 44 y.o. male patient presented to [facility] ED via law enforcement under involuntary commitment status (IVC). He reports, "They were about to kill me."  The patient is extremely paranoid during his assessment.  He keeps voicing, "I need long-term care."  He states, "I need to be somewhere for a long time and not know after that I will be better." The patient was seen face-to-face by this provider; chart reviewed and consulted with Dr. Dolores Frame on 11/06/2018 due to the patient's care. It was discussed with the EDP that the patient does meet criteria to be admitted to the psychiatric inpatient unit.  On evaluation, the patient is alert and oriented but very delusional and is getting more paranoid as his assessment continues. He is cooperative and mood-congruent with affect.  The patient does not appear to be responding to internal or external stimuli. He is presenting with delusional thinking. The patient admits to auditory and visual hallucinations.  The patient voiced that he was in a room with "some people," and they were talking about him.  He states, "I could feel there  was going to hurt me."  He voiced, "did you everything about my past without me telling them."  "I need help.  I need a long-term place to go to." The patient admits to suicidal ideations but denies homicidal ideations. The patient is presenting with psychotic and paranoid behaviors.   Plan: The patient is a safety risk to self and does require psychiatric inpatient admission for stabilization and treatment.  HPI: Per Dr. Dolores Frame; Cory Floyd is a 44 y.o. male brought to the ED via EMS from [facility] store for erratic behavior and suicidal ideation.  Patient reports he is homeless and having thoughts of hurting himself.  Complains of chronic knee pain from walking.  Denies HI/AH/VH.  Admits to cocaine use tonight.  Denies fever, cough, chest pain, shortness of breath, abdominal pain, nausea or vomiting.  Past Psychiatric History:  Alcoholic (HCC) Clean for 3 years Bipolar 1 disorder (HCC) Depressed Schizophrenia (HCC)  Risk to Self: Suicidal Ideation: (P) Yes-Currently Present Suicidal Intent: (P) No Is patient at risk for suicide?: (P) No Suicidal Plan?: (P) No Access to Means: (P) No What has been your use of drugs/alcohol within the last 12 months?: (P) Crack Cocaine How many times?: (P) 1 Triggers for Past Attempts: (P) None known Intentional Self Injurious Behavior: (P) Cutting Yes Risk to Others:   No Prior Inpatient Therapy:   Yes Prior Outpatient Therapy:   Yes  Past Medical History:  Past Medical History:  Diagnosis Date  . 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  Anginal pain (HCC)   . Arthritis   . Bipolar 1 disorder (HCC)   . Depressed   . Dyspnea   . Fatty liver   . Schizophrenia [facility])     Past Surgical History:  Procedure Laterality Date  . COLONOSCOPY  02/05/2014   diverticulosis, hyperplastic polyp x 2  . fracture arm Right   . KNEE SURGERY Right 2013  . NASAL SEPTUM SURGERY    . NASAL TURBINATE REDUCTION Bilateral 12/12/2016   Procedure:  TURBINATE REDUCTION/SUBMUCOSAL RESECTION;  Surgeon: Linus Salmons, MD;  Location: [facility] ORS;  Service: ENT;  Laterality: Bilateral;   Family History:  Family History  Problem Relation Age of Onset  . Hepatitis C Mother   . Cancer Mother   . Cancer Maternal Aunt   . Hypertension Maternal Grandmother    Family Psychiatric  History:  Social History:  Social History   Substance and Sexual Activity  Alcohol Use Yes     Social History   Substance and Sexual Activity  Drug Use Yes  . Types: Cocaine   Comment: heroin    Social History   Socioeconomic History  . Marital status: Divorced    Spouse name: Not on file  . Number of children: Not on file  . Years of education: Not on file  . Highest education level: Not on file  Occupational History  . Not on file  Tobacco Use  . Smoking status: Current Every Day Smoker    Packs/day: 1.00    Years: 20.00    Pack years: 20.00    Types: Cigarettes  . Smokeless tobacco: Never Used  Substance and Sexual Activity  . Alcohol use: Yes  . Drug use: Yes    Types: Cocaine    Comment: heroin  . Sexual activity: Yes    Birth control/protection: None  Other Topics Concern  . Not on file  Social History Narrative  . Not on file   Social Determinants of Health   Financial Resource Strain:   . Difficulty of Paying Living Expenses: Not on file  Food Insecurity:   . Worried About Programme researcher, broadcasting/film/video in the Last Year: Not on file  . Ran Out of Food in the Last Year: Not on file  Transportation Needs:   . Lack of Transportation (Medical): Not on file  . Lack of Transportation (Non-Medical): Not on file  Physical Activity:   . Days of Exercise per Week: Not on file  . Minutes of Exercise per Session: Not on file  Stress:   . Feeling of Stress : Not on file  Social Connections:   . Frequency of Communication with Friends and Family: Not on file  . Frequency of Social Gatherings with Friends and Family: Not on file  . Attends  Religious Services: Not on file  . Active Member of Clubs or Organizations: Not on file  . Attends Banker Meetings: Not on file  . Marital Status: Not on file   Additional Social History:    Allergies:   Allergies  Allergen Reactions  . Carrot [Daucus Carota] Anaphylaxis and Rash  . Penicillins Anaphylaxis and Hives    Has patient had a PCN reaction causing immediate rash, facial/tongue/throat swelling, SOB or lightheadedness with hypotension: Yes Has patient had a PCN reaction causing severe rash involving mucus membranes or skin necrosis: No Has patient had a PCN reaction that required hospitalization Yes Has patient had a PCN reaction occurring within the last 10 years: No If all of  the above answers are "NO", then may proceed with Cephalosporin use.     Labs:  Results for orders placed or performed during the hospital encounter of 11/07/19 (from the past 48 hour(s))  CBC with Differential     Status: Abnormal   Collection Time: 11/07/19  4:21 AM  Result Value Ref Range   WBC 10.7 (H) 4.0 - 10.5 K/uL   RBC 4.44 4.22 - 5.81 MIL/uL   Hemoglobin 13.9 13.0 - 17.0 g/dL   HCT 47.4 25.9 - 56.3 %   MCV 93.9 80.0 - 100.0 fL   MCH 31.3 26.0 - 34.0 pg   MCHC 33.3 30.0 - 36.0 g/dL   RDW 87.5 64.3 - 32.9 %   Platelets 214 150 - 400 K/uL   nRBC 0.0 0.0 - 0.2 %   Neutrophils Relative % 77 %   Neutro Abs 8.2 (H) 1.7 - 7.7 K/uL   Lymphocytes Relative 15 %   Lymphs Abs 1.6 0.7 - 4.0 K/uL   Monocytes Relative 8 %   Monocytes Absolute 0.8 0.1 - 1.0 K/uL   Eosinophils Relative 0 %   Eosinophils Absolute 0.0 0.0 - 0.5 K/uL   Basophils Relative 0 %   Basophils Absolute 0.0 0.0 - 0.1 K/uL   Immature Granulocytes 0 %   Abs Immature Granulocytes 0.03 0.00 - 0.07 K/uL    Comment: Performed at [facility], [address] [place], [place] [address]  Comprehensive metabolic panel     Status: Abnormal   Collection Time: 11/07/19  4:21 AM  Result Value Ref Range    Sodium 137 135 - 145 mmol/L   Potassium 3.0 (L) 3.5 - 5.1 mmol/L   Chloride 101 98 - 111 mmol/L   CO2 22 22 - 32 mmol/L   Glucose, Bld 193 (H) 70 - 99 mg/dL   BUN 16 6 - 20 mg/dL   Creatinine, Ser 1.66 0.61 - 1.24 mg/dL   Calcium 9.0 8.9 - 06.3 mg/dL   Total Protein 8.4 (H) 6.5 - 8.1 g/dL   Albumin 4.7 3.5 - 5.0 g/dL   AST 29 15 - 41 U/L   ALT 27 0 - 44 U/L   Alkaline Phosphatase 66 38 - 126 U/L   Total Bilirubin 1.0 0.3 - 1.2 mg/dL   GFR calc non Af Amer >60 >60 mL/min   GFR calc Af Amer >60 >60 mL/min   Anion gap 14 5 - 15    Comment: Performed at [facility], [address] [place], [place] [address]    No current facility-administered medications for this encounter.   Current Outpatient Medications  Medication Sig Dispense Refill  . ARIPiprazole (ABILIFY) 30 MG tablet Take 1 tablet (30 mg total) by mouth daily. 30 tablet 1  . buPROPion (WELLBUTRIN XL) 150 MG 24 hr tablet Take 1 tablet (150 mg total) by mouth daily. 30 tablet 1  . divalproex (DEPAKOTE) 500 MG DR tablet Take 3 tablets (1,500 mg total) by mouth at bedtime. 90 tablet 1  . gabapentin (NEURONTIN) 600 MG tablet Take 0.5 tablets (300 mg total) by mouth 3 (three) times daily. 45 tablet 1  . hydrOXYzine (ATARAX/VISTARIL) 25 MG tablet Take 1 tablet (25 mg total) by mouth 3 (three) times daily as needed for anxiety. 30 tablet 1  . traZODone (DESYREL) 50 MG tablet Take 1 tablet (50 mg total) by mouth at bedtime as needed for sleep. 30 tablet 1    Musculoskeletal: Strength & Muscle Tone: within normal limits Gait &  Station: normal Patient leans: N/A  Psychiatric Specialty Exam: Physical Exam  Nursing note and vitals reviewed. Constitutional: He is oriented to person, place, and time. He appears well-developed and well-nourished.  Cardiovascular: Normal rate.  Respiratory: Effort normal.  Musculoskeletal:        General: Normal range of motion.     Cervical back: Normal range of motion and neck supple.   Neurological: He is alert and oriented to person, place, and time.    Review of Systems  Psychiatric/Behavioral: Positive for hallucinations. The patient is nervous/anxious.   All other systems reviewed and are negative.   Blood pressure 111/87, pulse (!) 133, temperature 98.1 F (36.7 C), temperature source Oral, resp. rate (!) 22, SpO2 97 %.There is no height or weight on file to calculate BMI.  General Appearance: Bizarre, Casual and Disheveled  Eye Contact:  Poor  Speech:  Slow  Volume:  Decreased  Mood:  Anxious, Depressed and Euphoric  Affect:  Congruent  Thought Process:  Disorganized  Orientation:  Full (Time, Place, and Person)  Thought Content:  Illogical, Delusions and Paranoid Ideation  Suicidal Thoughts:  No  Homicidal Thoughts:  No  Memory:  Immediate;   Fair Recent;   Fair Remote;   Fair  Judgement:  Impaired  Insight:  Lacking  Psychomotor Activity:  Increased  Concentration:  Concentration: Poor and Attention Span: Poor  Recall:  Poor  Fund of Knowledge:  Fair  Language:  Fair  Akathisia:  Negative  Handed:  Right  AIMS (if indicated):     Assets:  Communication Skills Desire for Improvement Intimacy Social Support  ADL's:  Intact  Cognition:  Impaired,  Mild  Sleep:   Good     Treatment Plan Summary: Medication management and Plan Patient meets criteria for psychiatric inpatient admission.  Disposition: Recommend psychiatric Inpatient admission when medically cleared. Supportive therapy provided about ongoing stressors.  Caroline Sauger, NP 11/07/2019 5:36 AM

## 2019-11-07 NOTE — BH Assessment (Signed)
Assessment Note  Cory Floyd is an 44 y.o. male presenting to [facility] ED under IVC after a police traffic stop this morning. Patient stated that he was suicidal and admits to drug use stating he used crack and heroin. During assessment patient was paranoid as he reported that "they are about to kill me" and continues to voice "I need long term care, I get a check you can take my money." Patient reported "for almost 2 and a half weeks, I've been jumping around and I ran into some people I knew and we were going to get a room because I'm homeless and I started using drugs." Patient reports "I don't know what all they gave me but I did use crack." Patient was unable to recall how much he had used "it was a lot." Patient UDS positive for Cocaine and Opiates. Patient reported AH and VH and reported "I could hear them talking about me." Patient's thought process was tangential at times as he continued to jump around to different details "I need to go to [facility]." Patient was alert and oriented but had some persecutory delusions that people were out to get him "I don't trust those people." Patient reports that he has not been sleeping and has not had an appetite. He reports that he has a current warrant for his arrest for "driving without a license." Patient does however receive outpatient treatment at [facility]. Patient reports current SI with no plan and HI he reports "I have a vendetta against people" but does not report a specific person he wants to harm.  Per Psyc NP the patient is a safety risk to self and does require psychiatric inpatient admission for stabilization and treatment.  Diagnosis: Bipolar disorder with psychotic features, Substance-Induced Psychotic Disorder  Past Medical History:  Past Medical History:  Diagnosis Date  . 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 Anginal pain (HCC)   . Arthritis   . Bipolar 1 disorder (HCC)   . Depressed   . Dyspnea   . Fatty liver   .  Schizophrenia [facility])     Past Surgical History:  Procedure Laterality Date  . COLONOSCOPY  02/05/2014   diverticulosis, hyperplastic polyp x 2  . fracture arm Right   . KNEE SURGERY Right 2013  . NASAL SEPTUM SURGERY    . NASAL TURBINATE REDUCTION Bilateral 12/12/2016   Procedure: TURBINATE REDUCTION/SUBMUCOSAL RESECTION;  Surgeon: Linus Salmons, MD;  Location: [facility] ORS;  Service: ENT;  Laterality: Bilateral;    Family History:  Family History  Problem Relation Age of Onset  . Hepatitis C Mother   . Cancer Mother   . Cancer Maternal Aunt   . Hypertension Maternal Grandmother     Social History:  reports that he has been smoking cigarettes. He has a 20.00 pack-year smoking history. He has never used smokeless tobacco. He reports current alcohol use. He reports current drug use. Drug: Cocaine.  Additional Social History:  Alcohol / Drug Use Pain Medications: See MAR Prescriptions: See MAR Over the Counter: See MAR History of alcohol / drug use?: Yes Substance #1 Name of Substance 1: "Crack" Cocaine 1 - Last Use / Amount: 11/07/19  CIWA: CIWA-Ar BP: 111/87 Pulse Rate: (!) 133 COWS:    Allergies:  Allergies  Allergen Reactions  . Carrot [Daucus Carota] Anaphylaxis and Rash  . Penicillins Anaphylaxis and Hives    Has patient had a PCN reaction causing immediate rash, facial/tongue/throat swelling, SOB or lightheadedness with  hypotension: Yes Has patient had a PCN reaction causing severe rash involving mucus membranes or skin necrosis: No Has patient had a PCN reaction that required hospitalization Yes Has patient had a PCN reaction occurring within the last 10 years: No If all of the above answers are "NO", then may proceed with Cephalosporin use.     Home Medications: (Not in a hospital admission)   OB/GYN Status:  No LMP for male patient.  General Assessment Data Location of Assessment: [facility] ED TTS Assessment: In system Is this a Tele or Face-to-Face  Assessment?: Face-to-Face Is this an Initial Assessment or a Re-assessment for this encounter?: Initial Assessment Patient Accompanied by:: N/A Language Other than English: No Living Arrangements: Homeless/Shelter What gender do you identify as?: Male Marital status: Single Living Arrangements: Other (Comment)(Homeless) Can pt return to current living arrangement?: Yes Admission Status: Involuntary Petitioner: Police Is patient capable of signing voluntary admission?: No Referral Source: Self/Family/Friend Insurance type: Medicare  Medical Screening Exam ([place]) Medical Exam completed: Yes  Crisis Care Plan Living Arrangements: Other (Comment)(Homeless) Legal Guardian: Other:(Patient is his own legal guardian) Name of Psychiatrist: Dr. Gladys Damme - [facility] Name of Therapist: [facility]  Education Status Is patient currently in school?: No Highest grade of school patient has completed: 12th Is the patient employed, unemployed or receiving disability?: Unemployed, Receiving disability income  Risk to self with the past 6 months Suicidal Ideation: Yes-Currently Present Has patient been a risk to self within the past 6 months prior to admission? : Yes Suicidal Intent: No Has patient had any suicidal intent within the past 6 months prior to admission? : No Is patient at risk for suicide?: No Suicidal Plan?: No Has patient had any suicidal plan within the past 6 months prior to admission? : No Access to Means: No What has been your use of drugs/alcohol within the last 12 months?: Crack Cocaine Previous Attempts/Gestures: Yes How many times?: 1 Triggers for Past Attempts: None known Intentional Self Injurious Behavior: Cutting Comment - Self Injurious Behavior: Patient reports cutting himself in the past Family Suicide History: Unknown Recent stressful life event(s): Other (Comment)(Patient is currently homeless) Persecutory voices/beliefs?:  Yes Depression: Yes Depression Symptoms: Loss of interest in usual pleasures, Feeling worthless/self pity Substance abuse history and/or treatment for substance abuse?: Yes Suicide prevention information given to non-admitted patients: Not applicable  Risk to Others within the past 6 months Homicidal Ideation: Yes-Currently Present Does patient have any lifetime risk of violence toward others beyond the six months prior to admission? : No Thoughts of Harm to Others: Yes-Currently Present Comment - Thoughts of Harm to Others: Patient reports "I have a vendeta" Current Homicidal Intent: Yes-Currently Present Identified Victim: None reported History of harm to others?: No Assessment of Violence: None Noted Does patient have access to weapons?: No Criminal Charges Pending?: Yes Describe Pending Criminal Charges: Reports he has a current warrant for driving without a license Does patient have a court date: No Is patient on probation?: No  Psychosis Hallucinations: Auditory, Visual Delusions: Persecutory  Mental Status Report Appearance/Hygiene: In scrubs Eye Contact: Good Motor Activity: Freedom of movement Speech: Pressured, Rapid Level of Consciousness: Alert Mood: Anxious Affect: Anxious Anxiety Level: Severe Thought Processes: Tangential Judgement: Partial Orientation: Person, Place, Time, Situation, Appropriate for developmental age Obsessive Compulsive Thoughts/Behaviors: Minimal  Cognitive Functioning Concentration: Decreased Memory: Recent Intact, Remote Intact Is patient IDD: No Insight: Fair Impulse Control: Fair Appetite: Poor Have you had any weight changes? : No Change Sleep:  Decreased Total Hours of Sleep: 0 Vegetative Symptoms: None  ADLScreening [facility] Assessment Services) Patient's cognitive ability adequate to safely complete daily activities?: Yes Patient able to express need for assistance with ADLs?: Yes Independently performs ADLs?: Yes  (appropriate for developmental age)  Prior Inpatient Therapy Prior Inpatient Therapy: Yes Prior Therapy Dates: 10/2012; 04/2012; 03/2012; 10/2011; 09/2011; 09/2019 Prior Therapy Facilty/Provider(s): [facility], [facility] Reason for Treatment: Pt was unable to recall the reason for past admissions  Prior Outpatient Therapy Prior Outpatient Therapy: Yes Prior Therapy Dates: Current Prior Therapy Facilty/Provider(s): [facility] Reason for Treatment: Peer Support; Medication Management Does patient have an ACCT team?: No Does patient have Intensive In-House Services?  : No Does patient have [facility] services? : No Does patient have P4CC services?: No  ADL Screening (condition at time of admission) Patient's cognitive ability adequate to safely complete daily activities?: Yes Is the patient deaf or have difficulty hearing?: No Does the patient have difficulty seeing, even when wearing glasses/contacts?: No Does the patient have difficulty concentrating, remembering, or making decisions?: No Patient able to express need for assistance with ADLs?: Yes Does the patient have difficulty dressing or bathing?: No Independently performs ADLs?: Yes (appropriate for developmental age) Does the patient have difficulty walking or climbing stairs?: No Weakness of Legs: None Weakness of Arms/Hands: None  Home Assistive Devices/Equipment Home Assistive Devices/Equipment: None  Therapy Consults (therapy consults require a physician order) PT Evaluation Needed: No OT Evalulation Needed: No SLP Evaluation Needed: No Abuse/Neglect Assessment (Assessment to be complete while patient is alone) Abuse/Neglect Assessment Can Be Completed: Yes Physical Abuse: Denies Verbal Abuse: Denies Sexual Abuse: Denies Exploitation of patient/patient's resources: Denies Self-Neglect: Denies Values / Beliefs Cultural Requests During Hospitalization: None Spiritual Requests During Hospitalization:  None Consults Spiritual Care Consult Needed: No Transition of Care Team Consult Needed: No Advance Directives (For Healthcare) Does Patient Have a Medical Advance Directive?: No          Disposition: Per Psyc NP the patient is a safety risk to self and does require psychiatric inpatient admission for stabilization and treatment. Disposition Initial Assessment Completed for this Encounter: Yes  On Site Evaluation by:   Reviewed with Physician:    Benay Pike MS LCAS-A 11/07/2019 5:41 AM

## 2019-11-07 NOTE — ED Notes (Signed)
IVC/  PENDING  PLACEMENT 

## 2019-11-07 NOTE — ED Triage Notes (Signed)
Pt presents after police traffic stop. Pt states he is suicidal and admits to drug use stating crack and heroin.

## 2019-11-07 NOTE — BH Assessment (Signed)
Referral information for Psychiatric Hospitalization faxed to;    [facility] ([phone]),    [facility] ([phone] -or- [phone]),    [facility] ([phone])   [facility] ([phone]),     [facility] ([phone] or [phone])   [facility] ([phone])   [facility] ([phone])   [facility] ([phone])  

## 2019-11-07 NOTE — ED Provider Notes (Signed)
[facility] Emergency Department Provider Note   ____________________________________________   First MD Initiated Contact with Patient 11/07/19 [phone]     (approximate)  I have reviewed the triage vital signs and the nursing notes.   HISTORY  Chief Complaint Suicidal ideation  Level V caveat: Limited by intoxication  HPI Cory Floyd is a 44 y.o. male brought to the ED via EMS from [facility] store for erratic behavior and suicidal ideation.  Patient reports he is homeless and having thoughts of hurting himself.  Complains of chronic knee pain from walking.  Denies HI/AH/VH.  Admits to cocaine use tonight.  Denies fever, cough, chest pain, shortness of breath, abdominal pain, nausea or vomiting.       Past Medical History:  Diagnosis Date  . Alcoholic ([place])    clean for 3 years  . Anginal pain ([place])   . Arthritis   . Bipolar 1 disorder ([place])   . Depressed   . Dyspnea   . Fatty liver   . Schizophrenia [facility])     Patient Active Problem List   Diagnosis Date Noted  . Cocaine abuse ([place]) 10/20/2019  . Bipolar affective ([place]) 10/19/2019  . COVID-19 virus infection 10/04/2019  . Bipolar 1 disorder ([place]) 09/30/2019  . Bipolar affective disorder, mixed, severe, with psychotic behavior ([place]) 09/30/2019  . Back pain 09/30/2019  . Arthritis 09/30/2019  . Bipolar disorder without psychotic features ([place]) 09/18/2019  . Paranoia ([place]) 09/17/2019  . Psychosis ([place]) 09/17/2019  . Psychotic disorder ([place]) 09/17/2019  . Gastroesophageal reflux disease without esophagitis 09/21/2016  . Obstructive sleep apnea 09/21/2016  . Morbid obesity with BMI of 40.0-44.9, adult ([place]) 08/15/2016  . Stable angina pectoris ([place]) 08/15/2016    Past Surgical History:  Procedure Laterality Date  . COLONOSCOPY  02/05/2014   diverticulosis, hyperplastic polyp x 2  . fracture arm Right   . KNEE SURGERY Right 2013  . NASAL SEPTUM SURGERY    . NASAL TURBINATE  REDUCTION Bilateral 12/12/2016   Procedure: TURBINATE REDUCTION/SUBMUCOSAL RESECTION;  Surgeon: Beverly Gust, MD;  Location: [facility] ORS;  Service: ENT;  Laterality: Bilateral;    Prior to Admission medications   Medication Sig Start Date End Date Taking? Authorizing Provider  ARIPiprazole (ABILIFY) 30 MG tablet Take 1 tablet (30 mg total) by mouth daily. 10/28/19   Money, Lowry Ram, FNP  buPROPion (WELLBUTRIN XL) 150 MG 24 hr tablet Take 1 tablet (150 mg total) by mouth daily. 10/28/19   Money, Lowry Ram, FNP  divalproex (DEPAKOTE) 500 MG DR tablet Take 3 tablets (1,500 mg total) by mouth at bedtime. 10/28/19   Money, Lowry Ram, FNP  gabapentin (NEURONTIN) 600 MG tablet Take 0.5 tablets (300 mg total) by mouth 3 (three) times daily. 10/28/19   Money, Lowry Ram, FNP  hydrOXYzine (ATARAX/VISTARIL) 25 MG tablet Take 1 tablet (25 mg total) by mouth 3 (three) times daily as needed for anxiety. 10/28/19   Money, Lowry Ram, FNP  traZODone (DESYREL) 50 MG tablet Take 1 tablet (50 mg total) by mouth at bedtime as needed for sleep. 10/28/19   Money, Lowry Ram, FNP    Allergies Carrot [daucus carota] and Penicillins  Family History  Problem Relation Age of Onset  . Hepatitis C Mother   . Cancer Mother   . Cancer Maternal Aunt   . Hypertension Maternal Grandmother     Social History Social History   Tobacco Use  . Smoking status: Current Every Day Smoker    Packs/day:  1.00    Years: 20.00    Pack years: 20.00    Types: Cigarettes  . Smokeless tobacco: Never Used  Substance Use Topics  . Alcohol use: Yes  . Drug use: Yes    Types: Cocaine    Comment: heroin    Review of Systems  Constitutional: No fever/chills Eyes: No visual changes. ENT: No sore throat. Cardiovascular: Denies chest pain. Respiratory: Denies shortness of breath. Gastrointestinal: No abdominal pain.  No nausea, no vomiting.  No diarrhea.  No constipation. Genitourinary: Negative for dysuria. Musculoskeletal: Negative for back  pain. Skin: Negative for rash. Neurological: Negative for headaches, focal weakness or numbness. Psychiatric:  Positive for suicidal ideation.  ____________________________________________   PHYSICAL EXAM:  VITAL SIGNS: ED Triage Vitals  Enc Vitals Group     BP      Pulse      Resp      Temp      Temp src      SpO2      Weight      Height      Head Circumference      Peak Flow      Pain Score      Pain Loc      Pain Edu?      Excl. in GC?     Constitutional: Alert and oriented.  Disheveled appearing and in mild acute distress. Eyes: Conjunctivae are normal. PERRL. EOMI. Head: Atraumatic. Nose: No congestion/rhinnorhea. Mouth/Throat: Mucous membranes are moist.  Oropharynx non-erythematous. Neck: No stridor.   Cardiovascular: Tachycardic rate, regular rhythm. Grossly normal heart sounds.  Good peripheral circulation. Respiratory: Normal respiratory effort.  No retractions. Lungs CTAB. Gastrointestinal: Soft and nontender. No distention. No abdominal bruits. No CVA tenderness. Musculoskeletal: No lower extremity tenderness nor edema.  No joint effusions. Neurologic:  Normal speech and language. No gross focal neurologic deficits are appreciated. No gait instability. Skin:  Skin is warm, dry and intact. No rash noted. Psychiatric: Mood and affect are flat. Speech and behavior are normal.  ____________________________________________   LABS (all labs ordered are listed, but only abnormal results are displayed)  Labs Reviewed  CBC WITH DIFFERENTIAL/PLATELET - Abnormal; Notable for the following components:      Result Value   WBC 10.7 (*)    Neutro Abs 8.2 (*)    All other components within normal limits  COMPREHENSIVE METABOLIC PANEL - Abnormal; Notable for the following components:   Potassium 3.0 (*)    Glucose, Bld 193 (*)    Total Protein 8.4 (*)    All other components within normal limits  ACETAMINOPHEN LEVEL - Abnormal; Notable for the following  components:   Acetaminophen (Tylenol), Serum <10 (*)    All other components within normal limits  SALICYLATE LEVEL - Abnormal; Notable for the following components:   Salicylate Lvl <7.0 (*)    All other components within normal limits  URINE DRUG SCREEN, QUALITATIVE ([facility] ONLY) - Abnormal; Notable for the following components:   Cocaine Metabolite,Ur [place] POSITIVE (*)    Opiate, Ur Screen POSITIVE (*)    All other components within normal limits  RESPIRATORY PANEL BY RT PCR (FLU A&B, COVID)  ETHANOL   ____________________________________________  EKG  ED ECG REPORT I, Emelyn Roen J, the attending physician, personally viewed and interpreted this ECG.   Date: 11/07/2019  EKG Time: 0430  Rate: 115  Rhythm: sinus tachycardia  Axis: Normal  Intervals:none  ST&T Change: Nonspecific  ____________________________________________  RADIOLOGY  ED MD interpretation: None  Official radiology report(s): No results found.  ____________________________________________   PROCEDURES  Procedure(s) performed (including Critical Care):  Procedures   ____________________________________________   INITIAL IMPRESSION / ASSESSMENT AND PLAN / ED COURSE  As part of my medical decision making, I reviewed the following data within the electronic MEDICAL RECORD NUMBER Nursing notes reviewed and incorporated, Labs reviewed, EKG interpreted, Old chart reviewed, Radiograph reviewed, A consult was requested and obtained from this/these consultant(s) Psychiatry and Notes from prior ED visits     Cory Floyd was evaluated in Emergency Department on 11/07/2019 for the symptoms described in the history of present illness. He was evaluated in the context of the global COVID-19 pandemic, which necessitated consideration that the patient might be at risk for infection with the SARS-CoV-2 virus that causes COVID-19. Institutional protocols and algorithms that pertain to the evaluation of patients at risk  for COVID-19 are in a state of rapid change based on information released by regulatory bodies including the CDC and federal and state organizations. These policies and algorithms were followed during the patient's care in the ED.    44 year old male who presents with suicidal ideation.  States he is out of all of his psychiatric medications.  Will place patient under IVC for his safety.  Obtain psychiatric evaluation and disposition.  Clinical Course as of Nov 07 603  Fri Nov 07, 2019  0604 Psychiatric NP has evaluated patient and will likely admit him to the [facility].   [JS]    Clinical Course User Index [JS] Irean Hong, MD     ____________________________________________   FINAL CLINICAL IMPRESSION(S) / ED DIAGNOSES  Final diagnoses:  Bipolar affective disorder, currently depressed, moderate (HCC)  Hypokalemia  Cocaine abuse [facility])     ED Discharge Orders    None       Note:  This document was prepared using Dragon voice recognition software and may include unintentional dictation errors.   Irean Hong, MD 11/07/19 [phone]

## 2019-11-08 ENCOUNTER — Emergency Department
Admission: EM | Admit: 2019-11-08 | Discharge: 2019-11-09 | Disposition: A | Discharge status: Home / Self Care | Payer: Medicare Other | Source: Home / Self Care | Attending: Emergency Medicine | Admitting: Emergency Medicine

## 2019-11-08 ENCOUNTER — Emergency Department: Payer: Medicare Other

## 2019-11-08 ENCOUNTER — Encounter: Payer: Self-pay | Admitting: Emergency Medicine

## 2019-11-08 ENCOUNTER — Other Ambulatory Visit: Payer: Self-pay

## 2019-11-08 DIAGNOSIS — R45851 Suicidal ideations: Secondary | ICD-10-CM

## 2019-11-08 DIAGNOSIS — F17210 Nicotine dependence, cigarettes, uncomplicated: Secondary | ICD-10-CM | POA: Insufficient documentation

## 2019-11-08 DIAGNOSIS — F1410 Cocaine abuse, uncomplicated: Secondary | ICD-10-CM | POA: Insufficient documentation

## 2019-11-08 DIAGNOSIS — M79675 Pain in left toe(s): Secondary | ICD-10-CM

## 2019-11-08 DIAGNOSIS — F319 Bipolar disorder, unspecified: Secondary | ICD-10-CM | POA: Insufficient documentation

## 2019-11-08 DIAGNOSIS — F14150 Cocaine abuse with cocaine-induced psychotic disorder with delusions: Secondary | ICD-10-CM | POA: Diagnosis not present

## 2019-11-08 DIAGNOSIS — Z8616 Personal history of COVID-19: Secondary | ICD-10-CM | POA: Insufficient documentation

## 2019-11-08 DIAGNOSIS — Z5900 Homelessness unspecified: Secondary | ICD-10-CM

## 2019-11-08 DIAGNOSIS — Z79899 Other long term (current) drug therapy: Secondary | ICD-10-CM | POA: Insufficient documentation

## 2019-11-08 DIAGNOSIS — G8929 Other chronic pain: Secondary | ICD-10-CM

## 2019-11-08 HISTORY — DX: COVID-19: U07.1

## 2019-11-08 LAB — COMPREHENSIVE METABOLIC PANEL
ALT: 25 U/L (ref 0–44)
AST: 26 U/L (ref 15–41)
Albumin: 4.1 g/dL (ref 3.5–5.0)
Alkaline Phosphatase: 60 U/L (ref 38–126)
Anion gap: 8 (ref 5–15)
BUN: 16 mg/dL (ref 6–20)
CO2: 27 mmol/L (ref 22–32)
Calcium: 9 mg/dL (ref 8.9–10.3)
Chloride: 103 mmol/L (ref 98–111)
Creatinine, Ser: 0.98 mg/dL (ref 0.61–1.24)
GFR calc Af Amer: >60 mL/min (ref >60)
GFR calc non Af Amer: >60 mL/min (ref >60)
Glucose, Bld: 124 mg/dL — ABNORMAL HIGH (ref 70–99)
Potassium: 4.1 mmol/L (ref 3.5–5.1)
Sodium: 138 mmol/L (ref 135–145)
Total Bilirubin: 0.9 mg/dL (ref 0.3–1.2)
Total Protein: 7.7 g/dL (ref 6.5–8.1)

## 2019-11-08 LAB — ACETAMINOPHEN LEVEL: Acetaminophen (Tylenol), Serum: <10 ug/mL — ABNORMAL LOW (ref 10–30)

## 2019-11-08 LAB — CBC
HCT: 41.8 % (ref 39.0–52.0)
Hemoglobin: 13.6 g/dL (ref 13.0–17.0)
MCH: 30.8 pg (ref 26.0–34.0)
MCHC: 32.5 g/dL (ref 30.0–36.0)
MCV: 94.6 fL (ref 80.0–100.0)
Platelets: 180 10*3/uL (ref 150–400)
RBC: 4.42 MIL/uL (ref 4.22–5.81)
RDW: 13.6 % (ref 11.5–15.5)
WBC: 5.7 10*3/uL (ref 4.0–10.5)
nRBC: 0 % (ref 0.0–0.2)

## 2019-11-08 LAB — ETHANOL: Alcohol, Ethyl (B): <10 mg/dL (ref <10)

## 2019-11-08 LAB — SALICYLATE LEVEL: Salicylate Lvl: <7 mg/dL — ABNORMAL LOW (ref 7.0–30.0)

## 2019-11-08 MED ORDER — HYDROXYZINE HCL 25 MG PO TABS: 25.0000 mg | ORAL_TABLET | Freq: Three times a day (TID) | ORAL | Status: DC | PRN

## 2019-11-08 MED ORDER — BUPROPION HCL ER (XL) 150 MG PO TB24
150.0000 mg | ORAL_TABLET | Freq: Every day | ORAL | Status: DC
  Administered 2019-11-09: 150 mg via ORAL
  Filled 2019-11-08: qty 1

## 2019-11-08 MED ORDER — ARIPIPRAZOLE 15 MG PO TABS
30.0000 mg | ORAL_TABLET | Freq: Every day | ORAL | Status: DC
  Administered 2019-11-09: 30 mg via ORAL
  Filled 2019-11-08 (×2): qty 2

## 2019-11-08 MED ORDER — HYDROXYZINE HCL 25 MG PO TABS
25.0000 mg | ORAL_TABLET | Freq: Three times a day (TID) | ORAL | Status: DC | PRN
  Administered 2019-11-08: 14:00:00 25 mg via ORAL
  Filled 2019-11-08: qty 1

## 2019-11-08 MED ORDER — IBUPROFEN 600 MG PO TABS
600.0000 mg | ORAL_TABLET | Freq: Once | ORAL | Status: AC
  Administered 2019-11-08: 600 mg via ORAL
  Filled 2019-11-08: qty 1

## 2019-11-08 MED ORDER — GABAPENTIN 300 MG PO CAPS
300.0000 mg | ORAL_CAPSULE | Freq: Three times a day (TID) | ORAL | Status: DC
  Administered 2019-11-08: 11:00:00 300 mg via ORAL
  Filled 2019-11-08: qty 1

## 2019-11-08 MED ORDER — BUPROPION HCL ER (XL) 150 MG PO TB24
150.0000 mg | ORAL_TABLET | Freq: Every day | ORAL | Status: DC
  Administered 2019-11-08: 14:00:00 150 mg via ORAL
  Filled 2019-11-08: qty 1

## 2019-11-08 MED ORDER — GABAPENTIN 600 MG PO TABS
300.0000 mg | ORAL_TABLET | Freq: Three times a day (TID) | ORAL | Status: DC
  Administered 2019-11-08 – 2019-11-09 (×2): 300 mg via ORAL
  Filled 2019-11-08 (×2): qty 1

## 2019-11-08 MED ORDER — GABAPENTIN 600 MG PO TABS: 300.0000 mg | ORAL_TABLET | Freq: Three times a day (TID) | ORAL | Status: DC

## 2019-11-08 MED ORDER — TRAZODONE HCL 50 MG PO TABS: 50.0000 mg | ORAL_TABLET | Freq: Every evening | ORAL | Status: DC | PRN

## 2019-11-08 MED ORDER — ARIPIPRAZOLE 15 MG PO TABS
30.0000 mg | ORAL_TABLET | Freq: Every day | ORAL | Status: DC
  Administered 2019-11-08: 14:00:00 30 mg via ORAL
  Filled 2019-11-08: qty 2

## 2019-11-08 MED ORDER — IBUPROFEN 400 MG PO TABS: 400.0000 mg | ORAL_TABLET | Freq: Four times a day (QID) | ORAL | Status: DC | PRN

## 2019-11-08 MED ORDER — DIVALPROEX SODIUM 500 MG PO DR TAB: 1500.0000 mg | DELAYED_RELEASE_TABLET | Freq: Every day | ORAL | Status: DC

## 2019-11-08 MED ORDER — DIVALPROEX SODIUM 500 MG PO DR TAB
1500.0000 mg | DELAYED_RELEASE_TABLET | Freq: Every day | ORAL | Status: DC
  Administered 2019-11-08: 1500 mg via ORAL
  Filled 2019-11-08: qty 3

## 2019-11-08 NOTE — ED Notes (Signed)
Patient voices understanding of discharge instructions, all belongings given back to Patient. Patient left per self.

## 2019-11-08 NOTE — ED Provider Notes (Signed)
[name] Emergency Department Provider Note  ____________________________________________   First MD Initiated Contact with Patient 11/08/19 1618     (approximate)  I have reviewed the triage vital signs and the nursing notes.   HISTORY  Chief Complaint Suicidal    HPI Cory Floyd is a 44 y.o. male  With h/o bipolar d/o, polysubstance abuse and dependence, here with suicidal ideation. Pt was just seen and evaluated for drug abuse w/ suicidal thoughts. He had been cleared and discharged. He states he has nowhere to live right now and was out on the street. He was not able to get his medications, which made him feel more helpless and suicidal. He also reportedly is currently running from someone who wants to hurt him, so he felt unsafe. He checked back in. He does state he got the urge to harm himself while out on the street. He also began to c/o right great toe pain which is new - no new trauma but feels like he may have broken. Pain is moderate, aching, throbbing.        Past Medical History:  Diagnosis Date  . 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 Anginal pain (HCC)   . Arthritis   . Bipolar 1 disorder (HCC)   . COVID-19   . Depressed   . Dyspnea   . Fatty liver   . Schizophrenia [facility])     Patient Active Problem List   Diagnosis Date Noted  . 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 Bipolar affective (HCC) 10/19/2019  . COVID-19 virus infection 10/04/2019  . Bipolar 1 disorder (HCC) 09/30/2019  . Bipolar affective disorder, mixed, severe, with psychotic behavior (HCC) 09/30/2019  . Back pain 09/30/2019  . Arthritis 09/30/2019  . Bipolar disorder without psychotic features (HCC) 09/18/2019  . Paranoia (HCC) 09/17/2019  . Psychosis (HCC) 09/17/2019  . Psychotic disorder (HCC) 09/17/2019  . Gastroesophageal reflux disease without esophagitis 09/21/2016  . Obstructive sleep apnea  09/21/2016  . Morbid obesity with BMI of 40.0-44.9, adult (HCC) 08/15/2016  . Stable angina pectoris (HCC) 08/15/2016    Past Surgical History:  Procedure Laterality Date  . COLONOSCOPY  02/05/2014   diverticulosis, hyperplastic polyp x 2  . fracture arm Right   . KNEE SURGERY Right 2013  . NASAL SEPTUM SURGERY    . NASAL TURBINATE REDUCTION Bilateral 12/12/2016   Procedure: TURBINATE REDUCTION/SUBMUCOSAL RESECTION;  Surgeon: Linus Salmons, MD;  Location: [facility] ORS;  Service: ENT;  Laterality: Bilateral;    Prior to Admission medications   Medication Sig Start Date End Date Taking? Authorizing Provider  ARIPiprazole (ABILIFY) 30 MG tablet Take 1 tablet (30 mg total) by mouth daily. 10/28/19  Yes Money, Gerlene Burdock, FNP  buPROPion (WELLBUTRIN XL) 150 MG 24 hr tablet Take 1 tablet (150 mg total) by mouth daily. 10/28/19  Yes Money, Gerlene Burdock, FNP  divalproex (DEPAKOTE) 500 MG DR tablet Take 3 tablets (1,500 mg total) by mouth at bedtime. 10/28/19  Yes Money, Gerlene Burdock, FNP  gabapentin (NEURONTIN) 600 MG tablet Take 0.5 tablets (300 mg total) by mouth 3 (three) times daily. 10/28/19  Yes Money, Gerlene Burdock, FNP  hydrOXYzine (ATARAX/VISTARIL) 25 MG tablet Take 1 tablet (25 mg total) by mouth 3 (three) times daily as needed for anxiety. 10/28/19  Yes Money, Gerlene Burdock, FNP  traZODone (DESYREL) 50 MG tablet Take 1 tablet (50 mg total) by mouth at bedtime as needed for sleep.  10/28/19  Yes Money, Gerlene Burdock, FNP    Allergies Carrot [daucus carota] and Penicillins  Family History  Problem Relation Age of Onset  . Hepatitis C Mother   . Cancer Mother   . Cancer Maternal Aunt   . Hypertension Maternal Grandmother     Social History Social History   Tobacco Use  . Smoking status: Current Every Day Smoker    Packs/day: 1.00    Years: 20.00    Pack years: 20.00    Types: Cigarettes  . Smokeless tobacco: Never Used  Substance Use Topics  . Alcohol use: Yes  . Drug use: Yes    Types: Cocaine    Comment:  heroin    Review of Systems  Review of Systems  Constitutional: Negative for chills and fever.  HENT: Negative for sore throat.   Respiratory: Negative for shortness of breath.   Cardiovascular: Negative for chest pain.  Gastrointestinal: Negative for abdominal pain.  Genitourinary: Negative for flank pain.  Musculoskeletal: Positive for arthralgias and gait problem (due to toe pain). Negative for neck pain.  Skin: Negative for rash and wound.  Allergic/Immunologic: Negative for immunocompromised state.  Neurological: Negative for weakness and numbness.  Hematological: Does not bruise/bleed easily.  Psychiatric/Behavioral: Positive for suicidal ideas.  All other systems reviewed and are negative.    ____________________________________________  PHYSICAL EXAM:      VITAL SIGNS: ED Triage Vitals  Enc Vitals Group     BP 11/08/19 1553 (!) 99/55     Pulse Rate 11/08/19 1553 94     Resp 11/08/19 1553 18     Temp 11/08/19 1553 98.6 F (37 C)     Temp Source 11/08/19 1553 Oral     SpO2 11/08/19 1553 98 %     Weight 11/08/19 1619 229 lb (103.9 kg)     Height 11/08/19 1619 5\' 9"  (1.753 m)     Head Circumference --      Peak Flow --      Pain Score 11/08/19 1620 0     Pain Loc --      Pain Edu? --      Excl. in GC? --      Physical Exam Vitals and nursing note reviewed.  Constitutional:      General: He is not in acute distress.    Appearance: He is well-developed.  HENT:     Head: Normocephalic and atraumatic.  Eyes:     Conjunctiva/sclera: Conjunctivae normal.  Cardiovascular:     Rate and Rhythm: Normal rate and regular rhythm.     Heart sounds: Normal heart sounds.  Pulmonary:     Effort: Pulmonary effort is normal. No respiratory distress.     Breath sounds: No wheezing.  Abdominal:     General: There is no distension.  Musculoskeletal:     Cervical back: Neck supple.     Comments: Moderate tenderness and warmth to first MTP of R foot. No open wounds. No  skin induration.  Skin:    General: Skin is warm.     Capillary Refill: Capillary refill takes less than 2 seconds.     Findings: No rash.  Neurological:     Mental Status: He is alert and oriented to person, place, and time.     Motor: No abnormal muscle tone.  Psychiatric:     Comments: Dysphoric mood, +suicidal ideation       ____________________________________________   LABS (all labs ordered are listed, but only abnormal results are displayed)  Labs Reviewed  COMPREHENSIVE METABOLIC PANEL - Abnormal; Notable for the following components:      Result Value   Glucose, Bld 124 (*)    All other components within normal limits  SALICYLATE LEVEL - Abnormal; Notable for the following components:   Salicylate Lvl <5.8 (*)    All other components within normal limits  ACETAMINOPHEN LEVEL - Abnormal; Notable for the following components:   Acetaminophen (Tylenol), Serum <10 (*)    All other components within normal limits  ETHANOL  CBC  URINE DRUG SCREEN, QUALITATIVE ([facility] ONLY)    ____________________________________________  EKG: None ________________________________________  RADIOLOGY All imaging, including plain films, CT scans, and ultrasounds, independently reviewed by me, and interpretations confirmed via formal radiology reads.  ED MD interpretation:   None  Official radiology report(s): DG Foot Complete Left  Result Date: 11/08/2019 CLINICAL DATA:  Foot pain.  Left big toe pain. EXAM: LEFT FOOT - COMPLETE 3+ VIEW COMPARISON:  None. FINDINGS: There is no evidence of fracture or dislocation. There is no evidence of arthropathy or other focal bone abnormality. Soft tissues are unremarkable. IMPRESSION: Negative. Electronically Signed   By: Constance Holster M.D.   On: 11/08/2019 18:49    ____________________________________________  PROCEDURES   Procedure(s) performed (including Critical  Care):  Procedures  ____________________________________________  INITIAL IMPRESSION / MDM / [place] / ED COURSE  As part of my medical decision making, I reviewed the following data within the [place] notes reviewed and incorporated, Old chart reviewed, Notes from prior ED visits, and [place] Controlled Substance Database       *Cory Floyd was evaluated in Emergency Department on 11/08/2019 for the symptoms described in the history of present illness. He was evaluated in the context of the global COVID-19 pandemic, which necessitated consideration that the patient might be at risk for infection with the SARS-CoV-2 virus that causes COVID-19. Institutional protocols and algorithms that pertain to the evaluation of patients at risk for COVID-19 are in a state of rapid change based on information released by regulatory bodies including the CDC and federal and state organizations. These policies and algorithms were followed during the patient's care in the ED.  Some ED evaluations and interventions may be delayed as a result of limited staffing during the pandemic.*     Medical Decision Making:  44 yo M here with suicidal ideation. Suspect this is mostly situational but he does have psych history including recent admission. I discussed with Waylan Boga and TTS - will likely be able to get into [facility]. He will remain here for safety overnight voluntary. Otherwise, re: his toe pain - exam is c/w OA versus gout. XR pending. Exam not c/w septic arthritis or cellulitis.  XR foot neg. Motrin PRN. TTS/Psych has re-evaluated - will be discharged to [facility] in AM with bus pass. Pt in agreement.  ____________________________________________  FINAL CLINICAL IMPRESSION(S) / ED DIAGNOSES  Final diagnoses:  Chronic toe pain, left foot  Suicidal ideation  Homelessness     MEDICATIONS GIVEN DURING THIS VISIT:  Medications  ARIPiprazole  (ABILIFY) tablet 30 mg (30 mg Oral Not Given 11/08/19 1713)  buPROPion (WELLBUTRIN XL) 24 hr tablet 150 mg (150 mg Oral Not Given 11/08/19 1713)  hydrOXYzine (ATARAX/VISTARIL) tablet 25 mg (has no administration in time range)  divalproex (DEPAKOTE) DR tablet 1,500 mg (has no administration in time range)  gabapentin (NEURONTIN) tablet 300 mg (has no administration in time range)  traZODone (  DESYREL) tablet 50 mg (has no administration in time range)  ibuprofen (ADVIL) tablet 400 mg (has no administration in time range)  ibuprofen (ADVIL) tablet 600 mg (600 mg Oral Given 11/08/19 1751)     ED Discharge Orders    None       Note:  This document was prepared using Dragon voice recognition software and may include unintentional dictation errors.   Shaune Pollack, MD 11/08/19 [phone]

## 2019-11-08 NOTE — ED Notes (Signed)
VOL/ Pt going [facility] [place]

## 2019-11-08 NOTE — Discharge Instructions (Signed)
Bipolar 1 Disorder Bipolar 1 disorder is a mental health disorder in which a person has episodes of emotional highs, or mania, and may also have episodes of lows, or depression. Bipolar 1 disorder is different from other bipolar disorders in that it involves extreme episodes of mania (manic episodes). These episodes last at least one week or involve symptoms that are so severe that hospitalization is needed to keep the person safe. What are the causes? The cause of this condition is not known. What increases the risk? The following factors may make you more likely to develop this condition:  Having a family member with the disorder.  Having an imbalance of certain chemicals in the brain (neurotransmitters).  Experiencing stress, such as illness, financial problems, or a death.  Having certain conditions that affect the brain or spinal cord (neurologic conditions).  Having had a brain injury (trauma). What are the signs or symptoms? Symptoms of mania include:  Very high self-esteem or self-confidence.  Decreased need for sleep.  Unusual talkativeness. Speech may be very fast.  Racing thoughts with quick shifts between topics that may or may not be related (flight of ideas).  Ability to concentrate either greatly improved or decreased.  Increased purposeful activity, such as work, studies, or social activity.  Increased agitation. This could be pacing, squirming, fidgeting, or finger and toe tapping.  Impulsive behavior and poor judgment. This may result in high-risk activities that are sexual, financial, or physical. Symptoms of depression include:  Extreme degrees of sadness, uncontrollable crying, hopelessness, worthlessness, or numbness.  Sleep problems, such as insomnia, waking early, or sleeping too much.  No longer enjoying things you used to enjoy.  Isolation. You may often spend time alone.  Lack of energy or motivation, and moving more slowly than  normal.  Trouble making decisions.  Increased appetite or loss of appetite.  Thoughts of death, or wanting to harm yourself. Sometimes, you may have a mixed mood. This means having symptoms of mania and depression at the same time. Stress can often trigger these symptoms. How is this diagnosed? This condition may be diagnosed based on:  Emotional episodes.  Medical history.  Use of alcohol, drugs, and prescription medicines. Certain medical conditions and substances can cause symptoms that seem like bipolar disorder. This is called secondary bipolar disorder. Your health care provider may ask you to take a short test. This helps to understand your symptoms. You may also be asked to see a mental health specialist to follow up on this diagnosis and start treatment. How is this treated?     This condition is a long-term (chronic) illness. It is often managed with ongoing treatment rather than treatment only when symptoms occur. A combination of treatments is the main approach. Treatment may include:  Medicine. Medicine can be prescribed by a health care provider who specializes in treating mental health disorders (psychiatrist). Medicines called mood stabilizers are usually prescribed. If symptoms occur during treatment with a mood stabilizer, other medicines may be added.  Psychotherapy. Some forms of talk therapy, such as cognitive behavioral therapy (CBT) and family therapy, can help with learning to manage bipolar disorder.  Psychoeducation. This helps you and others understand how this disorder is managed. Include friends and family in educational sessions so they learn how best to support you.  Methods of managing your condition, such as journaling or relaxation exercises. Relaxation exercises include: ? Yoga. ? Meditation. ? Deep breathing.  Lifestyle changes, such as: ? Limiting alcohol and drug use. ?   Exercising regularly. ? Structuring when you go to bed and get  up. ? Eating a healthy diet.  Electroconvulsive therapy (ECT). This is a procedure in which electricity is applied to the brain through the scalp. It may be used in cases of severe bipolar disorder when medicine and psychotherapy work too slowly or do not work. Follow these instructions at home: Activity  Return to your normal activities as told by your health care provider.  Find activities that you enjoy, and make time to do them.  Exercise regularly as told by your health care provider. Lifestyle   Follow a set schedule for eating and sleeping.  Eat a healthy diet that includes fresh fruits and vegetables, whole grains, low-fat dairy, and lean meat.  Get at least 7-8 hours of sleep each night.  Avoid using products that contain nicotine or tobacco. If you want help quitting, ask your health care provider.  Do not use drugs. Alcohol use  Do not drink alcohol if: ? Your health care provider tells you not to drink. ? You are pregnant, may be pregnant, or are planning to become pregnant.  If you drink alcohol: ? Limit how much you use to:  0-1 drink a day for women.  0-2 drinks a day for men. ? Be aware of how much alcohol is in your drink. In the [place] one drink equals one 12 oz bottle of beer (355 mL), one 5 oz glass of wine (148 mL), or one 1 oz glass of hard liquor (44 mL). General instructions  Take over-the-counter and prescription medicines only as told by your health care provider. You may think about stopping your medicine, but it is very important to take all your medicine as prescribed.  Consider joining a support group. Your health care provider may be able to recommend one.  Talk with your family and friends about your treatment goals and how they can help.  Keep all follow-up visits as told by your health care provider. This is important. Where to find more information  National Alliance on Mental Illness: www.nami.org  National Institute of Mental  Health: www.nimh.nih.gov Contact a health care provider if:  Your symptoms get worse, or your loved ones tell you that your symptoms are getting worse.  You have uncomfortable side effects from your medicine.  You have trouble sleeping.  You have trouble doing daily activities.  You feel unsafe in your surroundings.  You are self-medicating with alcohol or drugs. Get help right away if:  You have new symptoms.  You have thoughts about harming yourself or others.  You are considering suicide. If you ever feel like you may hurt yourself or others, or have thoughts about taking your own life, get help right away. You can go to your nearest emergency department or call:  Your local emergency services (911 in the [place]  A suicide crisis helpline, such as the National Suicide Prevention Lifeline at 1-800-273-8255. This is open 24 hours a day. Summary  Bipolar 1 disorder is a lifelong mental health disorder in which a person has episodes of mania and depression.  This disorder is mainly treated with a combination of medicines, talk and behavioral therapies, and, often, electroconvulsive therapy (ECT).  Include friends and family in educational sessions so they know how best to support you.  Get help right away if you are considering suicide. This information is not intended to replace advice given to you by your health care provider. Make sure you discuss any questions you   have with your health care provider. Document Revised: 03/25/2019 Document Reviewed: 03/25/2019 Elsevier Patient Education  2020 Elsevier Inc.  

## 2019-11-08 NOTE — ED Notes (Signed)
Patient alert and oriented, states that " I still feel itchy, I don't know what I will do, I have no where to go, no one to call, I know I did drugs and I wish I didn't do that" He states I will have to walk out in the cold" Nurse gave him resources to call homeless shelters, and He states that He does have a phone,, Nurse went over discharge instructions with him.

## 2019-11-08 NOTE — ED Notes (Signed)
Patient ate 100% of supper tray and beverage.

## 2019-11-08 NOTE — ED Notes (Signed)
Patient was just discharged and returned to ED, stating that He was having Si thoughts, Patient ambulated in burgundy scrubs to recliner in the hallway, awaiting to talk to the medical Doctor and the psych Doctor, He is calm and cooperative, no behavioral issues at this time.

## 2019-11-08 NOTE — ED Provider Notes (Signed)
-----------------------------------------   6:27 AM on 11/08/2019 -----------------------------------------   Blood pressure 111/87, pulse (!) 133, temperature 98.1 F (36.7 C), temperature source Oral, resp. rate (!) 22, SpO2 97 %.  The patient is sleeping at this time.  There have been no acute events since the last update.  Awaiting disposition plan from Behavioral Medicine and/or Social Work team(s).   Irean Hong, MD 11/08/19 [phone]

## 2019-11-08 NOTE — Consult Note (Signed)
[facility] Psych ED Discharge  11/08/2019 10:43 AM ROHITH FAUTH  MRN:  [id number] Principal Problem: Cocaine abuse with cocaine-induced psychotic disorder, with delusions [facility]) Discharge Diagnoses: Principal Problem:   Cocaine abuse with cocaine-induced psychotic disorder, with delusions ([place]) Active Problems:   Bipolar 1 disorder ([place])   Gastroesophageal reflux disease without esophagitis   Morbid obesity with BMI of 40.0-44.9, adult ([place])   Obstructive sleep apnea   Stable angina pectoris (HCC)   Paranoia ([place])   Psychosis ([place])   Psychotic disorder ([place])   Bipolar disorder without psychotic features ([place])   Bipolar affective disorder, mixed, severe, with psychotic behavior ([place])   Back pain   Arthritis   COVID-19 virus infection   Bipolar affective ([place])  Subjective: " I am okay."  Patient seen and evaluated in person by this provider.  He reports people are after him, which is the case as he owes money to them for drugs.  Patient reports suicidal ideation related to the fear of the people after him and the need for protection.  These thoughts are passive and intermittent with no plan.  He is well-known to this ED for similar presentations and recently admitted to the unit, discharged on 10/19/19.  He states he did not go to his follow-up because he has a bad knee that needs surgery and was unable to go.  However, he is able to get about town for his other needs without issues.  Unfortunately he is not vested in stopping his substance use or getting a psychiatric help in the outpatient world.  No active suicidal ideations, homicidal ideations or hallucinations.  He is psychiatrically cleared at this time with psychiatric approval by Dr. Dwyane Dee.  Patient was discharged and told the nurse that she that he was going to return as he was not happy with band discharged.  Patient came back to the ED and check down.  Patient offered transfer with bus ticket to [employer] and he is  agreeable.  This will get him out of town to a safe place away from the drug dealers.  Denies suicidal ideations.  Social work consult placed for a bus pass and he will leave in the morning, patient agreeable with this plan.   11/06/18: AJAY STRUBEL is a 44 y.o. male patient presented to [facility] ED via law enforcement under involuntary commitment status (IVC). He reports, "They were about to kill me."  The patient is extremely paranoid during his assessment.  He keeps voicing, "I need long-term care."  He states, "I need to be somewhere for a long time and not know after that I will be better." The patient was seen face-to-face by this provider; chart reviewed and consulted with Dr. Beather Arbour on 11/06/2018 due to the patient's care. It was discussed with the EDP that the patient does meet criteria to be admitted to the psychiatric inpatient unit.   On evaluation, the patient is alert and oriented but very delusional and is getting more paranoid as his assessment continues. He is cooperative and mood-congruent with affect. The patient does not appear to be responding to internal or external stimuli. He is presenting with delusional thinking. The patient admits to auditory and visual hallucinations.  The patient voiced that he was in a room with "some people," and they were talking about him.  He states, "I could feel there was going to hurt me."  He voiced, "did you everything about my past without me telling them."  "I need help.  I need a long-term place to go to."  The patient admits to suicidal ideations but denies homicidal ideations. The patient is presenting with psychotic and paranoid behaviors.   Plan: The patient is a safety risk to self and does require psychiatric inpatient admission for stabilization and treatment.  HPI: Per Dr. Dolores Frame; Roddie Mc Sanchezis a 43 y.o.malebrought to the ED via EMS [facility] for erratic behavior and suicidal ideation. Patient reports he is homeless  and having thoughts of hurting himself.Complains of chronic knee pain from walking. Denies HI/AH/VH. Admits to cocaine use tonight. Denies fever, cough, chest pain, shortness of breath, abdominal pain, nausea or vomiting.  Total Time spent with patient: 45 minutes  Past Psychiatric History: Schizoaffective disorder, substance use disorders  Past Medical History:  Past Medical History:  Diagnosis Date  . 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 Anginal pain (HCC)   . Arthritis   . Bipolar 1 disorder (HCC)   . Depressed   . Dyspnea   . Fatty liver   . Schizophrenia [facility])     Past Surgical History:  Procedure Laterality Date  . COLONOSCOPY  02/05/2014   diverticulosis, hyperplastic polyp x 2  . fracture arm Right   . KNEE SURGERY Right 2013  . NASAL SEPTUM SURGERY    . NASAL TURBINATE REDUCTION Bilateral 12/12/2016   Procedure: TURBINATE REDUCTION/SUBMUCOSAL RESECTION;  Surgeon: Linus Salmons, MD;  Location: [facility] ORS;  Service: ENT;  Laterality: Bilateral;   Family History:  Family History  Problem Relation Age of Onset  . Hepatitis C Mother   . Cancer Mother   . Cancer Maternal Aunt   . Hypertension Maternal Grandmother    Family Psychiatric  History: none Social History:  Social History   Substance and Sexual Activity  Alcohol Use Yes     Social History   Substance and Sexual Activity  Drug Use Yes  . Types: Cocaine   Comment: heroin    Social History   Socioeconomic History  . Marital status: Divorced    Spouse name: Not on file  . Number of children: Not on file  . Years of education: Not on file  . Highest education level: Not on file  Occupational History  . Not on file  Tobacco Use  . Smoking status: Current Every Day Smoker    Packs/day: 1.00    Years: 20.00    Pack years: 20.00    Types: Cigarettes  . Smokeless tobacco: Never Used  Substance and Sexual Activity  . Alcohol use: Yes  . Drug use: Yes    Types: Cocaine    Comment: heroin   . Sexual activity: Yes    Birth control/protection: None  Other Topics Concern  . Not on file  Social History Narrative  . Not on file   Social Determinants of Health   Financial Resource Strain:   . Difficulty of Paying Living Expenses: Not on file  Food Insecurity:   . Worried About Programme researcher, broadcasting/film/video in the Last Year: Not on file  . Ran Out of Food in the Last Year: Not on file  Transportation Needs:   . Lack of Transportation (Medical): Not on file  . Lack of Transportation (Non-Medical): Not on file  Physical Activity:   . Days of Exercise per Week: Not on file  . Minutes of Exercise per Session: Not on file  Stress:   . Feeling of Stress : Not on file  Social Connections:   .  Frequency of Communication with Friends and Family: Not on file  . Frequency of Social Gatherings with Friends and Family: Not on file  . Attends Religious Services: Not on file  . Active Member of Clubs or Organizations: Not on file  . Attends Banker Meetings: Not on file  . Marital Status: Not on file    Has this patient used any form of tobacco in the last 30 days? (Cigarettes, Smokeless Tobacco, Cigars, and/or Pipes) A prescription for an FDA-approved tobacco cessation medication was offered at discharge and the patient refused  Current Medications: Current Facility-Administered Medications  Medication Dose Route Frequency Provider Last Rate Last Admin  . gabapentin (NEURONTIN) capsule 300 mg  300 mg Oral TID Charm Rings, NP       Current Outpatient Medications  Medication Sig Dispense Refill  . ARIPiprazole (ABILIFY) 30 MG tablet Take 1 tablet (30 mg total) by mouth daily. 30 tablet 1  . buPROPion (WELLBUTRIN XL) 150 MG 24 hr tablet Take 1 tablet (150 mg total) by mouth daily. 30 tablet 1  . divalproex (DEPAKOTE) 500 MG DR tablet Take 3 tablets (1,500 mg total) by mouth at bedtime. 90 tablet 1  . gabapentin (NEURONTIN) 600 MG tablet Take 0.5 tablets (300 mg total) by  mouth 3 (three) times daily. 45 tablet 1  . hydrOXYzine (ATARAX/VISTARIL) 25 MG tablet Take 1 tablet (25 mg total) by mouth 3 (three) times daily as needed for anxiety. 30 tablet 1  . traZODone (DESYREL) 50 MG tablet Take 1 tablet (50 mg total) by mouth at bedtime as needed for sleep. 30 tablet 1   PTA Medications: (Not in a hospital admission)   Musculoskeletal: Strength & Muscle Tone: within normal limits Gait & Station: normal Patient leans: N/A  Psychiatric Specialty Exam: Physical Exam Vitals and nursing note reviewed.  Constitutional:      Appearance: Normal appearance.  HENT:     Head: Normocephalic.     Nose: Nose normal.  Pulmonary:     Effort: Pulmonary effort is normal.  Musculoskeletal:        General: Normal range of motion.  Neurological:     General: No focal deficit present.     Mental Status: He is alert and oriented to person, place, and time.  Psychiatric:        Attention and Perception: Attention and perception normal.        Mood and Affect: Affect normal. Mood is anxious.        Speech: Speech normal.        Behavior: Behavior normal. Behavior is cooperative.        Thought Content: Thought content normal.        Cognition and Memory: Cognition and memory normal.        Judgment: Judgment normal.     Review of Systems  Hematological: Adenopathy:   Psychiatric/Behavioral: The patient is nervous/anxious.   All other systems reviewed and are negative.   Blood pressure 111/87, pulse (!) 133, temperature 98.1 F (36.7 C), temperature source Oral, resp. rate (!) 22, SpO2 97 %.There is no height or weight on file to calculate BMI.  General Appearance: Casual  Eye Contact:  Good  Speech:  Normal Rate  Volume:  Normal  Mood:  Anxious  Affect:  Congruent  Thought Process:  Coherent and Descriptions of Associations: Intact  Orientation:  Full (Time, Place, and Person)  Thought Content:  WDL and Logical  Suicidal Thoughts:  No  Homicidal Thoughts:  No   Memory:  Immediate;   Fair Recent;   Fair Remote;   Fair  Judgement:  Fair  Insight:  Fair  Psychomotor Activity:  Normal  Concentration:  Concentration: Good and Attention Span: Good  Recall:  Good  Fund of Knowledge:  Fair  Language:  Good  Akathisia:  No  Handed:  Right  AIMS (if indicated):     Assets:  Leisure Time Physical Health Resilience  ADL's:  Intact  Cognition:  WNL  Sleep:        Demographic Factors:  Male and Caucasian  Loss Factors: NA  Historical Factors: NA  Risk Reduction Factors:   Sense of responsibility to family and Positive social support  Continued Clinical Symptoms:  Anxiety, mild  Cognitive Features That Contribute To Risk:  None    Suicide Risk:  Minimal: No identifiable suicidal ideation.  Patients presenting with no risk factors but with morbid ruminations; may be classified as minimal risk based on the severity of the depressive symptoms   Plan Of Care/Follow-up recommendations:  Schizoaffective disorder, bipolar type: -Continue Depakote 1500 mg at bedtime -Continue Abilify 30 mg daily -Continue Wellbutrin 150 mg daily -Transfer to [employer] in a.m.  Anxiety: -Continue hydroxyzine 25 mg 3 times daily as needed -Continue gabapentin 300 mg 3 times a day  Insomnia: -Continue trazodone 50 mg as needed at bedtime Activity:  as tolerated Diet:  heart healthy diet  Disposition: discharge in am to [facility] Nanine Means, NP 11/08/2019, 10:43 AM

## 2019-11-08 NOTE — ED Notes (Signed)
Patient transferred via w/c to x-ray for foot x-ray, complaining of left big toe, and side of foot pain.

## 2019-11-08 NOTE — ED Notes (Signed)
Pt. Sleeping in 24 hallway recliner.

## 2019-11-08 NOTE — ED Triage Notes (Addendum)
Pt arrived via POV with reports of suicidal, pt states he started cutting himself today because he does not have any meds. Pt states he keeps getting kicked out after being here for a few days. Pt states he was just released from here today.  Pt states "I need some help, otherwise, I don't want to be here no more, I'll just die"  Pt states he also thinks he broke his L great toe.

## 2019-11-09 ENCOUNTER — Other Ambulatory Visit: Payer: Self-pay

## 2019-11-09 ENCOUNTER — Encounter (HOSPITAL_COMMUNITY): Payer: Self-pay | Admitting: Emergency Medicine

## 2019-11-09 ENCOUNTER — Emergency Department (HOSPITAL_COMMUNITY)
Admission: EM | Admit: 2019-11-09 | Discharge: 2019-11-09 | Disposition: A | Discharge status: Home / Self Care | Payer: Medicare Other | Attending: Emergency Medicine | Admitting: Emergency Medicine

## 2019-11-09 DIAGNOSIS — Z5900 Homelessness unspecified: Secondary | ICD-10-CM

## 2019-11-09 DIAGNOSIS — Z79899 Other long term (current) drug therapy: Secondary | ICD-10-CM | POA: Diagnosis not present

## 2019-11-09 DIAGNOSIS — Z8616 Personal history of COVID-19: Secondary | ICD-10-CM | POA: Diagnosis not present

## 2019-11-09 DIAGNOSIS — F251 Schizoaffective disorder, depressive type: Secondary | ICD-10-CM | POA: Insufficient documentation

## 2019-11-09 DIAGNOSIS — R45851 Suicidal ideations: Secondary | ICD-10-CM | POA: Insufficient documentation

## 2019-11-09 LAB — COMPREHENSIVE METABOLIC PANEL
ALT: 28 U/L (ref 0–44)
AST: 26 U/L (ref 15–41)
Albumin: 4.1 g/dL (ref 3.5–5.0)
Alkaline Phosphatase: 60 U/L (ref 38–126)
Anion gap: 13 (ref 5–15)
BUN: 12 mg/dL (ref 6–20)
CO2: 27 mmol/L (ref 22–32)
Calcium: 9.1 mg/dL (ref 8.9–10.3)
Chloride: 101 mmol/L (ref 98–111)
Creatinine, Ser: 1.12 mg/dL (ref 0.61–1.24)
GFR calc Af Amer: >60 mL/min (ref >60)
GFR calc non Af Amer: >60 mL/min (ref >60)
Glucose, Bld: 123 mg/dL — ABNORMAL HIGH (ref 70–99)
Potassium: 4.2 mmol/L (ref 3.5–5.1)
Sodium: 141 mmol/L (ref 135–145)
Total Bilirubin: 0.9 mg/dL (ref 0.3–1.2)
Total Protein: 7.5 g/dL (ref 6.5–8.1)

## 2019-11-09 LAB — CBC
HCT: 44.7 % (ref 39.0–52.0)
Hemoglobin: 14.4 g/dL (ref 13.0–17.0)
MCH: 31.4 pg (ref 26.0–34.0)
MCHC: 32.2 g/dL (ref 30.0–36.0)
MCV: 97.6 fL (ref 80.0–100.0)
Platelets: 200 10*3/uL (ref 150–400)
RBC: 4.58 MIL/uL (ref 4.22–5.81)
RDW: 13.4 % (ref 11.5–15.5)
WBC: 6.2 10*3/uL (ref 4.0–10.5)
nRBC: 0 % (ref 0.0–0.2)

## 2019-11-09 LAB — RAPID URINE DRUG SCREEN, HOSP PERFORMED
Amphetamines: NOT DETECTED
Barbiturates: NOT DETECTED
Benzodiazepines: NOT DETECTED
Cocaine: POSITIVE — AB
Opiates: NOT DETECTED
Tetrahydrocannabinol: NOT DETECTED

## 2019-11-09 LAB — ACETAMINOPHEN LEVEL: Acetaminophen (Tylenol), Serum: <10 ug/mL — ABNORMAL LOW (ref 10–30)

## 2019-11-09 LAB — ETHANOL: Alcohol, Ethyl (B): <10 mg/dL (ref <10)

## 2019-11-09 LAB — SALICYLATE LEVEL: Salicylate Lvl: <7 mg/dL — ABNORMAL LOW (ref 7.0–30.0)

## 2019-11-09 NOTE — Discharge Instructions (Addendum)
Substance Abuse Treatment Programs ° °Intensive Outpatient Programs °[facility]     °[address]      °[place], [place]                   °[phone]      ° °[facility] °[address] °[place], [place] °336-379-7146 ° °[facility]     °(Inpatient and outpatient)     °[address]           °[phone]   ° °[facility] °336-288-1484 (Suboxone and Methadone) ° °[address]      °[place], [place] [address]      °336-882-2125      ° °[address] °[place], [place] °852-3033 ° °[facility] (Outpatient/Inpatient, [facility])    °(insurance only) [phone]      °       °[facility] (Groups & Residential) °[place], [place] °336-389-1413 ° °   °[facility]     °[address]     °[place], [place]      °[phone]      ° °[facility] (for caregivers and family) °[address] °[place], [place] °336-299-4655 ° ° ° ° ° °Residential Treatment Programs °[facility]      °[address], [place], [place] [address]  °([phone]) [phone]      ° °[facility] °[address] [place], [place] [address] °[phone] ° °[facility]        °336-248-8914      ° °[facility] °1-800-659-3381 ° °[facility]             °[address]                                         °[place], [place]                                                °877-615-2722 or 336-784-9470                              ° °[facility] [place] °[address] °[place], [address] °1.877.941.8954 ° °[facility]    °[address]      °[place], [place]     °336-273-5306      ° °[facility] °[address] °[place], [place] °336-285-9073 ° °[facility]   °[address]     °[place], [place] [address]     °336-899-1550      °Admissions: 8am-3pm M-F ° °[facility] °[address] °[place],  [place] °336-227-7417 ° °[facility]: Residential Program (90 Days)   °[place], [place]      °336-725-8389 or 800-758-6077    ° °[facility] °[place], [place] °(Walk in Hours over the weekend or by referral) ° °[facility] °[address], [place], [place] [address] °(336) [phone] ° °Crisis Mobile: [facility]:  1-877-626-1772 (for crisis response 24 hours a day) °[facility] Hotline:      1-800-256-2452 °Outpatient Psychiatry and Counseling ° °[facility]: Mobile Crisis   Management 24 hours:  1-877-626-1772 ° °[facility] sliding scale fee and walk in schedule: M-F 8am-12pm/1pm-3pm °[address]  °[place], [place] [address] °336-387-6161 ° °[facility] °[address] °[place], [place] [address] °336-703-9650 ° °[facility] ([facility] known [facility])- new patient walk-in appointments available Monday - Friday 8am -3pm.          °[address] °[place], [place] [address] °336-676-6840 or crisis line- 336-676-6905 ° °[facility]/ Intensive Outpatient Therapy Program °[address] °[place], [place] [address] °336-832-9804 ° °[facility]                  °Crisis Services      °336.641.4993      °[address]     °[place], [place] [address]                ° °[facility]   °[facility] °[phone] °[address] °[place], [place] [address] ° ° °[facility]          °[address]  °[place], [place] [address]       °(336) [phone] ° °[facility] °[address] °[place], [place] [address] °336-292-1510 ° °[facility]    °[address]    °[place], [place] [address]     °336-632-3505      ° °Parish McKinney, MD     °[address]     °[place] [address]     °336-282-1251     °  °[facility] °[address] °[place] [address] ° °[facility]     °[address]     °[place], [place]      °336-542-2076      ° °[facility] °Shamsher Ahluwalia, MD °[address] °[place], [place] [address] °336-420-9558 ° °[facility]     °[address]     °[place], [place] [address]     °336-274-1237      ° °[facility] °[address] °[place], [place] [address] °336-854-4450 °Resources for Temporary Residential Assistance/Crisis Centers ° °DAY CENTERS °[facility] °M-F 8am-3pm   °[address] [place], [place] [address]   336-332-0824 °Services include: laundry, barbering, support groups, case management, phone  & computer access, showers, AA/NA mtgs, mental health/substance abuse nurse, job skills class, disability information, VA assistance, spiritual classes, etc.  ° °HOMELESS SHELTERS ° °[facility]     °[facility]   °[address], [place]     °336.271.5959       °       °[facility] (women and children)       °[address] °, [place] [address] °336-275-0820 °[email or web address] for application and process °Application Required ° °[facility]   °[address]    °[place] [address]     °336.886.4922       °             °[facility] °[address] °, [place] [address] °336.273.5572 °336-235-0363(schedule application appt.) °Application Required ° °[facility] (women only)    °[address]     °[place],  [address]     °336-884-1039      °  Intake starts 6pm daily °Need valid ID, SSC, & Police report °[facility] [place] °[address] °[place], [place] °336-881-5420 °Application Required ° °[facility] (men only)     °[address]      °[place], [place]     °336.748.1962      ° °[facility] °(Pregnant women only) °[address] °[place], [place] °336-275-0206 ° °[facility]      °[address]      °[place], [place] [address]     °336-722-9951      °       °[facility] °[address] °[place], [place] °336-723-1848 °90 day commitment/SA/Application process ° °[facility](men only[facility]     °[address]     °[place], [place]     °336-748-1962       °Check-in at 7pm     °       °[facility] °[address] °[place], [place] [address] °336-248-6684 °Men/Women/Women and Children must be there by 7 pm ° °[facility] °[place], [place] °336-722-8721                ° °

## 2019-11-09 NOTE — ED Notes (Addendum)
[facility] [address] [place] via safe transport. Assured by Molli Knock, NP that it was a walk-in facility and was open. Phone calls to mens shelter were unanswered.

## 2019-11-09 NOTE — ED Triage Notes (Signed)
Pt states he is suicidal with a plan to cut himself.  He states, "hopefully its quick".  Reports he has been cutting his L forearm because he has been unable to get help.  Superficial lacerations to L forearm.  No bleeding at this time.  States he is off medication x 2 months and can't afford them.  Seen at [facility] ED 1/16 for same.

## 2019-11-09 NOTE — ED Provider Notes (Signed)
The patient has been evaluated at bedside by psychiatry.  Patient is clinically stable.  Not felt to be a danger to self or others.  No SI or Hi.  No indication for inpatient psychiatric admission at this time.  Appropriate for continued outpatient therapy.    Willy Eddy, MD 11/09/19 [phone]

## 2019-11-09 NOTE — BH Assessment (Signed)
Tele Assessment Note   Patient Name: Cory Floyd MRN: [id number] Referring Physician: Dr. Sherry Ruffing Location of Patient: [facility] Location of Provider: [facility] Cihlar is an 44 y.o. male. Pt reports SI with a plan to cut himself. Pt denies HI. Pt reports AH. Per Pt he has been diagnosed with Schizoaffective disorder-Bipolar type. Pt reports multiple hospitalizations. Pt was D/C from [facility] this am. Pt was referred to a local homeless shelter. Pt states he receives medication management at [facility]. Pt states he is currently homeless. Pt reports crack cocaine use.   Ludger Nutting, NP recommends D/C.   Diagnosis: F25.1 Schizoaffective, Bipolar type  Past Medical History:  Past Medical History:  Diagnosis Date  . 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 Anginal pain ([place])   . Arthritis   . Bipolar 1 disorder ([place])   . COVID-19   . Depressed   . Dyspnea   . Fatty liver   . Schizophrenia [facility])     Past Surgical History:  Procedure Laterality Date  . COLONOSCOPY  02/05/2014   diverticulosis, hyperplastic polyp x 2  . fracture arm Right   . KNEE SURGERY Right 2013  . NASAL SEPTUM SURGERY    . NASAL TURBINATE REDUCTION Bilateral 12/12/2016   Procedure: TURBINATE REDUCTION/SUBMUCOSAL RESECTION;  Surgeon: Beverly Gust, MD;  Location: [facility] ORS;  Service: ENT;  Laterality: Bilateral;    Family History:  Family History  Problem Relation Age of Onset  . Hepatitis C Mother   . Cancer Mother   . Cancer Maternal Aunt   . Hypertension Maternal Grandmother     Social History:  reports that he has been smoking cigarettes. He has a 20.00 pack-year smoking history. He has never used smokeless tobacco. He reports current alcohol use. He reports current drug use. Drug: Cocaine.  Additional Social History:  Alcohol / Drug Use Pain Medications: please see mar Prescriptions: please see mar Over the Counter: please see mar History of alcohol /  drug use?: Yes 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, Legal, Personal relationships, Work / School Substance #1 Name of Substance 1: crack cocaine use 1 - Age of First Use: unknown 1 - Amount (size/oz): "whatever I can" 1 - Frequency: "whenever I can" 1 - Duration: ongoing 1 - Last Use / Amount: "a couple of days ago"  CIWA: CIWA-Ar BP: 95/63 Pulse Rate: 82 COWS:    Allergies:  Allergies  Allergen Reactions  . Carrot [Daucus [place]] Anaphylaxis and Rash  . Penicillins Anaphylaxis and Hives    Has patient had a PCN reaction causing immediate rash, facial/tongue/throat swelling, SOB or lightheadedness with hypotension: Yes Has patient had a PCN reaction causing severe rash involving mucus membranes or skin necrosis: No Has patient had a PCN reaction that required hospitalization Yes Has patient had a PCN reaction occurring within the last 10 years: No If all of the above answers are "NO", then may proceed with Cephalosporin use.     Home Medications: (Not in a hospital admission)   OB/GYN Status:  No LMP for male patient.  General Assessment Data Location of Assessment: [facility] ED TTS Assessment: In system Is this a Tele or Face-to-Face Assessment?: Face-to-Face Is this an Initial Assessment or a Re-assessment for this encounter?: Initial Assessment Patient Accompanied by:: N/A Language Other than English: No Living Arrangements: Homeless/Shelter What gender do you identify as?: Male Marital status: Single Maiden name: NA Pregnancy Status: No Living Arrangements: Other (Comment) Can pt  return to current living arrangement?: Yes Admission Status: Voluntary Is patient capable of signing voluntary admission?: No Referral Source: Self/Family/Friend Insurance type: Medicare     Crisis Care Plan Living Arrangements: Other (Comment) Legal Guardian: (self) Name of Psychiatrist: Dr. Adrienne Mocha [facility] Name of Therapist: [facility]  Education Status Is  patient currently in school?: No Highest grade of school patient has completed: 12th Is the patient employed, unemployed or receiving disability?: Unemployed, Receiving disability income  Risk to self with the past 6 months Suicidal Ideation: Yes-Currently Present Has patient been a risk to self within the past 6 months prior to admission? : Yes Suicidal Intent: Yes-Currently Present Has patient had any suicidal intent within the past 6 months prior to admission? : Yes Is patient at risk for suicide?: Yes Suicidal Plan?: Yes-Currently Present Has patient had any suicidal plan within the past 6 months prior to admission? : Yes Specify Current Suicidal Plan: to cut self Access to Means: No What has been your use of drugs/alcohol within the last 12 months?: crack cocaine Previous Attempts/Gestures: Yes How many times?: 1 Other Self Harm Risks: NA Triggers for Past Attempts: None known Intentional Self Injurious Behavior: Cutting Comment - Self Injurious Behavior: cutting Family Suicide History: Unknown Recent stressful life event(s): Other (Comment) Persecutory voices/beliefs?: Yes Depression: Yes Depression Symptoms: Loss of interest in usual pleasures Substance abuse history and/or treatment for substance abuse?: Yes Suicide prevention information given to non-admitted patients: Not applicable  Risk to Others within the past 6 months Homicidal Ideation: No Does patient have any lifetime risk of violence toward others beyond the six months prior to admission? : No Thoughts of Harm to Others: No Comment - Thoughts of Harm to Others: NA Current Homicidal Intent: No Current Homicidal Plan: No Access to Homicidal Means: No Identified Victim: NA History of harm to others?: No Assessment of Violence: None Noted Violent Behavior Description: NA Does patient have access to weapons?: No Criminal Charges Pending?: No Describe Pending Criminal Charges: no Does patient have a court  date: No Is patient on probation?: No  Psychosis Hallucinations: Auditory Delusions: None noted  Mental Status Report Appearance/Hygiene: In scrubs Eye Contact: Fair Motor Activity: Freedom of movement Speech: Logical/coherent Level of Consciousness: Alert Mood: Euthymic Affect: Appropriate to circumstance Anxiety Level: Minimal Thought Processes: Coherent, Relevant Judgement: Unimpaired Orientation: Person, Time, Place, Situation Obsessive Compulsive Thoughts/Behaviors: None  Cognitive Functioning Concentration: Normal Memory: Recent Intact, Remote Intact Is patient IDD: No Insight: Fair Impulse Control: Fair Appetite: Fair Have you had any weight changes? : No Change Sleep: No Change Total Hours of Sleep: 7 Vegetative Symptoms: None  ADLScreening [facility] Assessment Services) Patient's cognitive ability adequate to safely complete daily activities?: Yes Patient able to express need for assistance with ADLs?: Yes Independently performs ADLs?: Yes (appropriate for developmental age)  Prior Inpatient Therapy Prior Inpatient Therapy: Yes Prior Therapy Dates: 10/2012; 04/2012; 03/2012; 10/2011; 09/2011; 09/2019 Prior Therapy Facilty/Provider(s): [facility], [facility] Reason for Treatment: Schizo-affective  Prior Outpatient Therapy Prior Outpatient Therapy: Yes Prior Therapy Dates: Current [facility] Therapy [facility]):  [facility] Reason for Treatment: Peer Support; Medication Management Does patient have an ACCT team?: No Does patient have Intensive In-House Services?  : No Does patient have [facility] services? : No Does patient have P4CC services?: No  ADL Screening (condition at time of admission) Patient's cognitive ability adequate to safely complete daily activities?: Yes Is the patient deaf or have difficulty hearing?: No Does the patient have difficulty seeing, even when  wearing glasses/contacts?: No Does the patient have difficulty concentrating,  remembering, or making decisions?: No Patient able to express need for assistance with ADLs?: Yes Does the patient have difficulty dressing or bathing?: No Independently performs ADLs?: Yes (appropriate for developmental age)       Abuse/Neglect Assessment (Assessment to be complete while patient is alone) Abuse/Neglect Assessment Can Be Completed: Yes Physical Abuse: Denies Verbal Abuse: Denies Sexual Abuse: Denies Exploitation of patient/patient's resources: Denies     Merchant navy officer (For Healthcare) Does Patient Have a Medical Advance Directive?: No Would patient like information on creating a medical advance directive?: No - Patient declined          Disposition:  Disposition Initial Assessment Completed for this Encounter: Yes  This service was provided via telemedicine using a 2-way, interactive audio and video technology.  Names of all persons participating in this telemedicine service and their role in this encounter. Name: Hillery Jacks Role: NP  Name:  Role:   Name:  Role:   Name:  Role:     Emmit Pomfret 11/09/2019 5:28 PM

## 2019-11-09 NOTE — Progress Notes (Signed)
CSW received consult for patient to assist with transportation to homeless shelter. CSW spoke with RN who stated the patient will discharge to a shelter in [place] via cab. RN reports having a cab voucher to use for patient.   Edwin Dada, MSW, LCSW-A Transitions of Care  Clinical Social Worker  [facility]  Medical ICU [phone]

## 2019-11-09 NOTE — ED Notes (Signed)
Pt given breakfast tray

## 2019-11-09 NOTE — ED Provider Notes (Signed)
[facility] EMERGENCY DEPARTMENT Provider Note   CSN: [id number] Arrival date & time: 11/09/19  1438     History Chief Complaint  Patient presents with  . Suicidal    Cory Floyd is a 44 y.o. male past medical history significant for alcohol abuse, bipolar 1 disorder, depression, schizophrenia, substance abuse presenting to emergency department today with chief complaint of suicidal ideations. He states he is in feeling this way for the last several months ever since he has been off his medications. He has been off his medications for approximately 2 months because he cannot afford them. He was seen in an outside emergency department on 11/08/2019 for the same. He was cleared by psychology. The plan was for him to be discharged to [facility] in [place] via safe transport. Patient states when he arrived he was told they were unaware of his coming and he could not be accepted into the shelter. He denies any recent drug use. Says he has used cocaine in the past. Denies any IV drug abuse. Admit to hearing voices. He states they are telling him to do things and telling him of all the bad things he has done in his life. Denies visual hallucinations. Denies homicidal ideations  He states he feels suicidal currently without a plan. Denies any pain, fever, chills, chest pain, abdominal pain or shortness of breath, urinary symptoms, diarrhea.    Past Medical History:  Diagnosis Date  . 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 Anginal pain (HCC)   . Arthritis   . Bipolar 1 disorder (HCC)   . COVID-19   . Depressed   . Dyspnea   . Fatty liver   . Schizophrenia [facility])     Patient Active Problem List   Diagnosis Date Noted  . 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 Bipolar affective (HCC) 10/19/2019  . COVID-19 virus infection 10/04/2019  . Bipolar 1 disorder (HCC) 09/30/2019  . Bipolar affective  disorder, mixed, severe, with psychotic behavior (HCC) 09/30/2019  . Back pain 09/30/2019  . Arthritis 09/30/2019  . Bipolar disorder without psychotic features (HCC) 09/18/2019  . Paranoia (HCC) 09/17/2019  . Psychosis (HCC) 09/17/2019  . Psychotic disorder (HCC) 09/17/2019  . Gastroesophageal reflux disease without esophagitis 09/21/2016  . Obstructive sleep apnea 09/21/2016  . Morbid obesity with BMI of 40.0-44.9, adult (HCC) 08/15/2016  . Stable angina pectoris (HCC) 08/15/2016    Past Surgical History:  Procedure Laterality Date  . COLONOSCOPY  02/05/2014   diverticulosis, hyperplastic polyp x 2  . fracture arm Right   . KNEE SURGERY Right 2013  . NASAL SEPTUM SURGERY    . NASAL TURBINATE REDUCTION Bilateral 12/12/2016   Procedure: TURBINATE REDUCTION/SUBMUCOSAL RESECTION;  Surgeon: Linus Salmons, MD;  Location: [facility] ORS;  Service: ENT;  Laterality: Bilateral;       Family History  Problem Relation Age of Onset  . Hepatitis C Mother   . Cancer Mother   . Cancer Maternal Aunt   . Hypertension Maternal Grandmother     Social History   Tobacco Use  . Smoking status: Current Every Day Smoker    Packs/day: 1.00    Years: 20.00    Pack years: 20.00    Types: Cigarettes  . Smokeless tobacco: Never Used  Substance Use Topics  . Alcohol use: Yes  . Drug use: Yes    Types: Cocaine    Comment: heroin  Home Medications Prior to Admission medications   Medication Sig Start Date End Date Taking? Authorizing Provider  ARIPiprazole (ABILIFY) 30 MG tablet Take 1 tablet (30 mg total) by mouth daily. 10/28/19   Money, Lowry Ram, FNP  buPROPion (WELLBUTRIN XL) 150 MG 24 hr tablet Take 1 tablet (150 mg total) by mouth daily. 10/28/19   Money, Lowry Ram, FNP  divalproex (DEPAKOTE) 500 MG DR tablet Take 3 tablets (1,500 mg total) by mouth at bedtime. 10/28/19   Money, Lowry Ram, FNP  gabapentin (NEURONTIN) 600 MG tablet Take 0.5 tablets (300 mg total) by mouth 3 (three) times daily.  10/28/19   Money, Lowry Ram, FNP  hydrOXYzine (ATARAX/VISTARIL) 25 MG tablet Take 1 tablet (25 mg total) by mouth 3 (three) times daily as needed for anxiety. 10/28/19   Money, Lowry Ram, FNP  traZODone (DESYREL) 50 MG tablet Take 1 tablet (50 mg total) by mouth at bedtime as needed for sleep. 10/28/19   Money, Lowry Ram, FNP    Allergies    Carrot [daucus carota] and Penicillins  Review of Systems   Review of Systems  All other systems are reviewed and are negative for acute change except as noted in the HPI.  Physical Exam Updated Vital Signs BP 95/63 (BP Location: Left Arm)   Pulse 82   Temp 98.7 F (37.1 C) (Oral)   Resp 16   SpO2 99%   Physical Exam Vitals and nursing note reviewed.  Constitutional:      General: He is not in acute distress.    Appearance: He is not ill-appearing.  HENT:     Head: Normocephalic and atraumatic.     Right Ear: Tympanic membrane and external ear normal.     Left Ear: Tympanic membrane and external ear normal.     Nose: Nose normal.     Mouth/Throat:     Mouth: Mucous membranes are moist.     Pharynx: Oropharynx is clear.  Eyes:     General: No scleral icterus.       Right eye: No discharge.        Left eye: No discharge.     Extraocular Movements: Extraocular movements intact.     Conjunctiva/sclera: Conjunctivae normal.     Pupils: Pupils are equal, round, and reactive to light.  Neck:     Vascular: No JVD.  Cardiovascular:     Rate and Rhythm: Normal rate and regular rhythm.     Pulses: Normal pulses.          Radial pulses are 2+ on the right side and 2+ on the left side.     Heart sounds: Normal heart sounds.  Pulmonary:     Comments: Lungs clear to auscultation in all fields. Symmetric chest rise. No wheezing, rales, or rhonchi. Abdominal:     Comments: Abdomen is soft, non-distended, and non-tender in all quadrants. No rigidity, no guarding. No peritoneal signs.  Musculoskeletal:        General: Normal range of motion.      Cervical back: Normal range of motion.  Skin:    General: Skin is warm and dry.     Capillary Refill: Capillary refill takes less than 2 seconds.  Neurological:     Mental Status: He is oriented to person, place, and time.     GCS: GCS eye subscore is 4. GCS verbal subscore is 5. GCS motor subscore is 6.     Comments: Fluent speech, no facial droop.  Psychiatric:  Mood and Affect: Mood normal.        Speech: Speech normal.        Behavior: Behavior normal.        Thought Content: Thought content is not paranoid. Thought content includes suicidal ideation. Thought content does not include homicidal ideation. Thought content includes suicidal plan. Thought content does not include homicidal plan.       ED Results / Procedures / Treatments   Labs (all labs ordered are listed, but only abnormal results are displayed) Labs Reviewed  COMPREHENSIVE METABOLIC PANEL - Abnormal; Notable for the following components:      Result Value   Glucose, Bld 123 (*)    All other components within normal limits  SALICYLATE LEVEL - Abnormal; Notable for the following components:   Salicylate Lvl <7.0 (*)    All other components within normal limits  ACETAMINOPHEN LEVEL - Abnormal; Notable for the following components:   Acetaminophen (Tylenol), Serum <10 (*)    All other components within normal limits  RAPID URINE DRUG SCREEN, HOSP PERFORMED - Abnormal; Notable for the following components:   Cocaine POSITIVE (*)    All other components within normal limits  ETHANOL  CBC    EKG None  Radiology DG Foot Complete Left  Result Date: 11/08/2019 CLINICAL DATA:  Foot pain.  Left big toe pain. EXAM: LEFT FOOT - COMPLETE 3+ VIEW COMPARISON:  None. FINDINGS: There is no evidence of fracture or dislocation. There is no evidence of arthropathy or other focal bone abnormality. Soft tissues are unremarkable. IMPRESSION: Negative. Electronically Signed   By: Katherine Mantle M.D.   On: 11/08/2019  18:49    Procedures Procedures (including critical care time)  Medications Ordered in ED Medications - No data to display  ED Course  I have reviewed the triage vital signs and the nursing notes.  Pertinent labs & imaging results that were available during my care of the patient were reviewed by me and considered in my medical decision making (see chart for details).    MDM Rules/Calculators/A&P                      No medical complaints today.  Labs ordered.  At this time patient is medically cleared for TTS evaluation. Of note patient was cleared by psychology here at an outside hospital.  He was given resources and sent to a shelter in [facility] unfortunately on patient's arrival he was told they do not have any open beds.  TTS is recommending discharge home with outpatient resources.  Also discussed case with case manager Burna Mortimer and put in for a case management consult for homelessness.  She she will reach out to patient after contacting partners ending homelessness.  Patient updated on plan. He is agreeable with plan of care. He is stable to be discharge home. Return precautions discussed.  Resources given.  Portions of this note were generated with Scientist, clinical (histocompatibility and immunogenetics). Dictation errors may occur despite best attempts at proofreading.  Final Clinical Impression(s) / ED Diagnoses Final diagnoses:  Suicidal ideation  Homelessness    Rx / DC Orders ED Discharge Orders    None       Kathyrn Lass 11/09/19 2004    Tegeler, Canary Brim, MD 11/09/19 2127

## 2019-12-22 ENCOUNTER — Encounter (HOSPITAL_COMMUNITY): Payer: Self-pay | Admitting: Emergency Medicine

## 2019-12-22 ENCOUNTER — Other Ambulatory Visit: Payer: Self-pay

## 2019-12-22 ENCOUNTER — Emergency Department (HOSPITAL_COMMUNITY)
Admission: EM | Admit: 2019-12-22 | Discharge: 2019-12-23 | Disposition: A | Discharge status: Home / Self Care | Payer: Medicare Other | Attending: Emergency Medicine | Admitting: Emergency Medicine

## 2019-12-22 DIAGNOSIS — R197 Diarrhea, unspecified: Secondary | ICD-10-CM | POA: Diagnosis not present

## 2019-12-22 DIAGNOSIS — F17210 Nicotine dependence, cigarettes, uncomplicated: Secondary | ICD-10-CM | POA: Insufficient documentation

## 2019-12-22 DIAGNOSIS — R112 Nausea with vomiting, unspecified: Secondary | ICD-10-CM | POA: Diagnosis not present

## 2019-12-22 DIAGNOSIS — G5603 Carpal tunnel syndrome, bilateral upper limbs: Secondary | ICD-10-CM | POA: Diagnosis not present

## 2019-12-22 DIAGNOSIS — Z79899 Other long term (current) drug therapy: Secondary | ICD-10-CM | POA: Insufficient documentation

## 2019-12-22 DIAGNOSIS — Z8616 Personal history of COVID-19: Secondary | ICD-10-CM | POA: Insufficient documentation

## 2019-12-22 DIAGNOSIS — R200 Anesthesia of skin: Secondary | ICD-10-CM | POA: Diagnosis present

## 2019-12-22 LAB — URINALYSIS, ROUTINE W REFLEX MICROSCOPIC
Bilirubin Urine: NEGATIVE
Glucose, UA: NEGATIVE mg/dL
Hgb urine dipstick: NEGATIVE
Ketones, ur: NEGATIVE mg/dL
Nitrite: NEGATIVE
Protein, ur: 30 mg/dL — AB
Specific Gravity, Urine: 1.032 — ABNORMAL HIGH (ref 1.005–1.030)
pH: 5 (ref 5.0–8.0)

## 2019-12-22 LAB — BASIC METABOLIC PANEL
Anion gap: 10 (ref 5–15)
BUN: 13 mg/dL (ref 6–20)
CO2: 27 mmol/L (ref 22–32)
Calcium: 9.3 mg/dL (ref 8.9–10.3)
Chloride: 103 mmol/L (ref 98–111)
Creatinine, Ser: 0.98 mg/dL (ref 0.61–1.24)
GFR calc Af Amer: >60 mL/min (ref >60)
GFR calc non Af Amer: >60 mL/min (ref >60)
Glucose, Bld: 130 mg/dL — ABNORMAL HIGH (ref 70–99)
Potassium: 3.5 mmol/L (ref 3.5–5.1)
Sodium: 140 mmol/L (ref 135–145)

## 2019-12-22 LAB — CBC
HCT: 44.4 % (ref 39.0–52.0)
Hemoglobin: 14.3 g/dL (ref 13.0–17.0)
MCH: 31.2 pg (ref 26.0–34.0)
MCHC: 32.2 g/dL (ref 30.0–36.0)
MCV: 96.7 fL (ref 80.0–100.0)
Platelets: 244 10*3/uL (ref 150–400)
RBC: 4.59 MIL/uL (ref 4.22–5.81)
RDW: 13.3 % (ref 11.5–15.5)
WBC: 8 10*3/uL (ref 4.0–10.5)
nRBC: 0 % (ref 0.0–0.2)

## 2019-12-22 MED ORDER — SODIUM CHLORIDE 0.9% FLUSH: 3.0000 mL | Freq: Once | INTRAVENOUS | Status: DC

## 2019-12-22 NOTE — ED Triage Notes (Signed)
Pt reports 1 week of bilateral hand numbness where at random times pt will feel like his hand are going numb. Pt has equal grip and + radial pulses. Pt also states " I just don't feel well, I will just break out into a sweat and I feel dehydrated" .

## 2019-12-23 ENCOUNTER — Encounter (HOSPITAL_COMMUNITY): Payer: Self-pay

## 2019-12-23 DIAGNOSIS — G5603 Carpal tunnel syndrome, bilateral upper limbs: Secondary | ICD-10-CM | POA: Diagnosis not present

## 2019-12-23 MED ORDER — ONDANSETRON HCL 4 MG/2ML IJ SOLN
4.0000 mg | Freq: Once | INTRAMUSCULAR | Status: AC
  Administered 2019-12-23: 4 mg via INTRAVENOUS
  Filled 2019-12-23: qty 2

## 2019-12-23 MED ORDER — IBUPROFEN 800 MG PO TABS: 800.0000 mg | ORAL_TABLET | Freq: Three times a day (TID) | ORAL | 0 refills | Status: DC

## 2019-12-23 MED ORDER — SODIUM CHLORIDE 0.9 % IV BOLUS
1000.0000 mL | Freq: Once | INTRAVENOUS | Status: AC
  Administered 2019-12-23: 01:00:00 1000 mL via INTRAVENOUS

## 2019-12-23 MED ORDER — ONDANSETRON HCL 4 MG PO TABS: 4.0000 mg | ORAL_TABLET | Freq: Three times a day (TID) | ORAL | 0 refills | Status: DC | PRN

## 2019-12-23 NOTE — ED Notes (Signed)
Ortho tech arrived to appy bilateral wrist splints.

## 2019-12-23 NOTE — ED Notes (Signed)
Pt was discharged from the ED. Pt read and understood discharge paperwork. Pt had vital signs completed. Pt conscious, breathing, and A&Ox4. No distress noted. Pt speaking in complete sentences. Pt ambulated out of the ED with a smooth and steady gait. E-signature not available.  

## 2019-12-23 NOTE — Progress Notes (Signed)
Orthopedic Tech Progress Note Patient Details:  Cory Floyd 02-02-1976 [id number]  Ortho Devices Type of Ortho Device: Velcro wrist splint Ortho Device/Splint Location: bi-lateral Ortho Device/Splint Interventions: Ordered, Application, Adjustment   Post Interventions Patient Tolerated: Well Instructions Provided: Care of device, Adjustment of device   Trinna Post 12/23/2019, 3:40 AM

## 2019-12-23 NOTE — Discharge Instructions (Signed)
Your nausea, vomiting, and diarrhea are thought to be a viral bug.  Please take the Zofran as needed for vomiting and nausea.  Please stay hydrated.  I recommend mixing 1 bottle of Gatorade and 1 bottle of water.  The numbness in your hands and forearms is thought to be caused by carpal tunnel syndrome.  Please wear the wrist splints at night and during the daytime as able.  You may need to follow-up with a hand doctor.  I have attached contact information for a hand specialist.  Please return for fever, worsening abdominal pain, or any other new or concerning symptoms.

## 2019-12-23 NOTE — ED Notes (Signed)
Pt care endorsed from Jen RN.  

## 2019-12-23 NOTE — ED Provider Notes (Signed)
[facility] EMERGENCY DEPARTMENT Provider Note   CSN: [id number] Arrival date & time: 12/22/19  1715     History Chief Complaint  Patient presents with  . Numbness    Cory Floyd is a 44 y.o. male.  Patient presents to the emergency department with a chief complaint of nausea, vomiting, and diarrhea.  He states that he had the symptoms today.  Reports eating muscles and sushi at a [place] buffet yesterday.  States that his job sent him home.  States that he wanted to be evaluated in the emergency department.  He denies any fevers chills.  Denies any focal abdominal pain.  States that he has been waiting in the waiting room since 5:00 this afternoon and he is now starving.  No longer vomiting.  Patient also requests to be evaluated for bilateral hand numbness and tingling.  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  He states that he has numbness and tingling into his hands and fingers.  States that his symptoms also get worse at night when he is asleep.  The history is provided by the patient. No language interpreter was used.       Past Medical History:  Diagnosis Date  . 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 Anginal pain (HCC)   . Arthritis   . Bipolar 1 disorder (HCC)   . COVID-19   . Depressed   . Dyspnea   . Fatty liver   . Schizophrenia [facility])     Patient Active Problem List   Diagnosis Date Noted  . 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 Bipolar affective (HCC) 10/19/2019  . COVID-19 virus infection 10/04/2019  . Bipolar 1 disorder (HCC) 09/30/2019  . Bipolar affective disorder, mixed, severe, with psychotic behavior (HCC) 09/30/2019  . Back pain 09/30/2019  . Arthritis 09/30/2019  . Bipolar disorder without psychotic features (HCC) 09/18/2019  . Paranoia (HCC) 09/17/2019  . Psychosis (HCC) 09/17/2019  . Psychotic disorder (HCC) 09/17/2019  . Gastroesophageal  reflux disease without esophagitis 09/21/2016  . Obstructive sleep apnea 09/21/2016  . Morbid obesity with BMI of 40.0-44.9, adult (HCC) 08/15/2016  . Stable angina pectoris (HCC) 08/15/2016    Past Surgical History:  Procedure Laterality Date  . COLONOSCOPY  02/05/2014   diverticulosis, hyperplastic polyp x 2  . fracture arm Right   . KNEE SURGERY Right 2013  . NASAL SEPTUM SURGERY    . NASAL TURBINATE REDUCTION Bilateral 12/12/2016   Procedure: TURBINATE REDUCTION/SUBMUCOSAL RESECTION;  Surgeon: Linus Salmons, MD;  Location: [facility] ORS;  Service: ENT;  Laterality: Bilateral;       Family History  Problem Relation Age of Onset  . Hepatitis C Mother   . Cancer Mother   . Cancer Maternal Aunt   . Hypertension Maternal Grandmother     Social History   Tobacco Use  . Smoking status: Current Every Day Smoker    Packs/day: 1.00    Years: 20.00    Pack years: 20.00    Types: Cigarettes  . Smokeless tobacco: Never Used  Substance Use Topics  . Alcohol use: Yes  . Drug use: Yes    Types: Cocaine    Comment: heroin    Home Medications Prior to Admission medications   Medication Sig Start Date End Date Taking? Authorizing Provider  ARIPiprazole (ABILIFY) 30 MG tablet Take 1 tablet (30 mg total) by mouth daily. 10/28/19  Money, Gerlene Burdock, FNP  buPROPion (WELLBUTRIN XL) 150 MG 24 hr tablet Take 1 tablet (150 mg total) by mouth daily. 10/28/19   Money, Gerlene Burdock, FNP  divalproex (DEPAKOTE) 500 MG DR tablet Take 3 tablets (1,500 mg total) by mouth at bedtime. 10/28/19   Money, Gerlene Burdock, FNP  gabapentin (NEURONTIN) 600 MG tablet Take 0.5 tablets (300 mg total) by mouth 3 (three) times daily. 10/28/19   Money, Gerlene Burdock, FNP  hydrOXYzine (ATARAX/VISTARIL) 25 MG tablet Take 1 tablet (25 mg total) by mouth 3 (three) times daily as needed for anxiety. 10/28/19   Money, Gerlene Burdock, FNP  traZODone (DESYREL) 50 MG tablet Take 1 tablet (50 mg total) by mouth at bedtime as needed for sleep. 10/28/19    Money, Gerlene Burdock, FNP    Allergies    Carrot [daucus carota] and Penicillins  Review of Systems   Review of Systems  All other systems reviewed and are negative.   Physical Exam Updated Vital Signs BP 126/78   Pulse 86   Temp 98.1 F (36.7 C) (Oral)   Resp (!) 21   Ht 5\' 10"  (1.778 m)   Wt 104.3 kg   SpO2 96%   BMI 33.00 kg/m   Physical Exam Vitals and nursing note reviewed.  Constitutional:      Appearance: He is well-developed.  HENT:     Head: Normocephalic and atraumatic.  Eyes:     Conjunctiva/sclera: Conjunctivae normal.  Cardiovascular:     Rate and Rhythm: Normal rate and regular rhythm.     Pulses: Normal pulses.     Heart sounds: No murmur.     Comments: Intact distal pulses Pulmonary:     Effort: Pulmonary effort is normal. No respiratory distress.     Breath sounds: Normal breath sounds.  Abdominal:     Palpations: Abdomen is soft.     Tenderness: There is no abdominal tenderness.     Comments: No focal abdominal tenderness, no RLQ tenderness or pain at McBurney's point, no RUQ tenderness or Murphy's sign, no left-sided abdominal tenderness, no fluid wave, or signs of peritonitis   Musculoskeletal:     Cervical back: Neck supple.     Comments: Positive Tinel and Phalen tests   Skin:    General: Skin is warm and dry.  Neurological:     Mental Status: He is alert and oriented to person, place, and time.  Psychiatric:        Mood and Affect: Mood normal.        Behavior: Behavior normal.     ED Results / Procedures / Treatments   Labs (all labs ordered are listed, but only abnormal results are displayed) Labs Reviewed  BASIC METABOLIC PANEL - Abnormal; Notable for the following components:      Result Value   Glucose, Bld 130 (*)    All other components within normal limits  URINALYSIS, ROUTINE W REFLEX MICROSCOPIC - Abnormal; Notable for the following components:   APPearance HAZY (*)    Specific Gravity, Urine 1.032 (*)    Protein, ur 30  (*)    Leukocytes,Ua TRACE (*)    Bacteria, UA RARE (*)    All other components within normal limits  CBC    EKG EKG Interpretation  Date/Time:  Monday December 22 2019 17:23:17 EST Ventricular Rate:  111 PR Interval:  124 QRS Duration: 84 QT Interval:  326 QTC Calculation: 443 R Axis:   65 Text Interpretation: Sinus tachycardia Confirmed by Palumbo,  April ([id number]) on 12/23/2019 12:32:33 AM   Radiology No results found.  Procedures Procedures (including critical care time)  Medications Ordered in ED Medications  sodium chloride flush (NS) 0.9 % injection 3 mL (has no administration in time range)  sodium chloride 0.9 % bolus 1,000 mL (has no administration in time range)  ondansetron (ZOFRAN) injection 4 mg (has no administration in time range)    ED Course  I have reviewed the triage vital signs and the nursing notes.  Pertinent labs & imaging results that were available during my care of the patient were reviewed by me and considered in my medical decision making (see chart for details).    MDM Rules/Calculators/A&P                      Patient with nausea, vomiting, diarrhea.  He ate muscles and sushi at a [place] buffet yesterday.  Vital signs are stable.  Laboratory work-up was unremarkable.  Patient feels dehydrated from the amount of vomiting he has had.  We will give a liter of fluid and some Zofran.  He does not have any abdominal tenderness.  Doubt surgical or acute abdomen.  Patient also complaining of bilateral hand numbness and tingling.  His symptoms are consistent with carpal tunnel syndrome.  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  I have provided the patient with splints for bilateral wrists which she is instructed to wear at night at the very least.  Recommend NSAIDs.  Will give contact information for hand surgery in case his symptoms worsen.   Final Clinical Impression(s) / ED Diagnoses Final diagnoses:  Nausea vomiting  and diarrhea  Bilateral carpal tunnel syndrome    Rx / DC Orders ED Discharge Orders         Ordered    ondansetron (ZOFRAN) 4 MG tablet  Every 8 hours PRN     12/23/19 0307    ibuprofen (ADVIL) 800 MG tablet  3 times daily     12/23/19 0307           Montine Circle, [name] 12/23/19 0311    Palumbo, April, MD 12/23/19 [phone]

## 2019-12-26 ENCOUNTER — Emergency Department (HOSPITAL_COMMUNITY): Payer: Medicare Other

## 2019-12-26 ENCOUNTER — Other Ambulatory Visit: Payer: Self-pay

## 2019-12-26 ENCOUNTER — Emergency Department (HOSPITAL_COMMUNITY)
Admission: EM | Admit: 2019-12-26 | Discharge: 2019-12-26 | Disposition: A | Discharge status: Home / Self Care | Payer: Medicare Other | Attending: Emergency Medicine | Admitting: Emergency Medicine

## 2019-12-26 ENCOUNTER — Encounter (HOSPITAL_COMMUNITY): Payer: Self-pay | Admitting: Emergency Medicine

## 2019-12-26 DIAGNOSIS — F17210 Nicotine dependence, cigarettes, uncomplicated: Secondary | ICD-10-CM | POA: Diagnosis not present

## 2019-12-26 DIAGNOSIS — Z8616 Personal history of COVID-19: Secondary | ICD-10-CM | POA: Diagnosis not present

## 2019-12-26 DIAGNOSIS — Z79899 Other long term (current) drug therapy: Secondary | ICD-10-CM | POA: Insufficient documentation

## 2019-12-26 DIAGNOSIS — R1012 Left upper quadrant pain: Secondary | ICD-10-CM | POA: Diagnosis not present

## 2019-12-26 DIAGNOSIS — F250 Schizoaffective disorder, bipolar type: Secondary | ICD-10-CM | POA: Diagnosis not present

## 2019-12-26 LAB — CBC
HCT: 41.4 % (ref 39.0–52.0)
Hemoglobin: 13.4 g/dL (ref 13.0–17.0)
MCH: 31.4 pg (ref 26.0–34.0)
MCHC: 32.4 g/dL (ref 30.0–36.0)
MCV: 97 fL (ref 80.0–100.0)
Platelets: 226 10*3/uL (ref 150–400)
RBC: 4.27 MIL/uL (ref 4.22–5.81)
RDW: 13.5 % (ref 11.5–15.5)
WBC: 7.4 10*3/uL (ref 4.0–10.5)
nRBC: 0 % (ref 0.0–0.2)

## 2019-12-26 LAB — COMPREHENSIVE METABOLIC PANEL
ALT: 30 U/L (ref 0–44)
AST: 33 U/L (ref 15–41)
Albumin: 3.8 g/dL (ref 3.5–5.0)
Alkaline Phosphatase: 68 U/L (ref 38–126)
Anion gap: 13 (ref 5–15)
BUN: 16 mg/dL (ref 6–20)
CO2: 24 mmol/L (ref 22–32)
Calcium: 9.2 mg/dL (ref 8.9–10.3)
Chloride: 103 mmol/L (ref 98–111)
Creatinine, Ser: 1.07 mg/dL (ref 0.61–1.24)
GFR calc Af Amer: >60 mL/min (ref >60)
GFR calc non Af Amer: >60 mL/min (ref >60)
Glucose, Bld: 118 mg/dL — ABNORMAL HIGH (ref 70–99)
Potassium: 3.9 mmol/L (ref 3.5–5.1)
Sodium: 140 mmol/L (ref 135–145)
Total Bilirubin: 0.9 mg/dL (ref 0.3–1.2)
Total Protein: 6.9 g/dL (ref 6.5–8.1)

## 2019-12-26 LAB — URINALYSIS, ROUTINE W REFLEX MICROSCOPIC
Bilirubin Urine: NEGATIVE
Glucose, UA: NEGATIVE mg/dL
Hgb urine dipstick: NEGATIVE
Ketones, ur: NEGATIVE mg/dL
Leukocytes,Ua: NEGATIVE
Nitrite: NEGATIVE
Protein, ur: NEGATIVE mg/dL
Specific Gravity, Urine: 1.023 (ref 1.005–1.030)
pH: 5 (ref 5.0–8.0)

## 2019-12-26 LAB — LIPASE, BLOOD: Lipase: 31 U/L (ref 11–51)

## 2019-12-26 MED ORDER — SODIUM CHLORIDE 0.9% FLUSH: 3.0000 mL | Freq: Once | INTRAVENOUS | Status: DC

## 2019-12-26 MED ORDER — DICYCLOMINE HCL 10 MG/ML IM SOLN
20.0000 mg | Freq: Once | INTRAMUSCULAR | Status: AC
  Administered 2019-12-26: 20 mg via INTRAMUSCULAR
  Filled 2019-12-26: qty 2

## 2019-12-26 MED ORDER — IOHEXOL 300 MG/ML  SOLN
100.0000 mL | Freq: Once | INTRAMUSCULAR | Status: AC | PRN
  Administered 2019-12-26: 100 mL via INTRAVENOUS

## 2019-12-26 MED ORDER — IBUPROFEN 800 MG PO TABS
800.0000 mg | ORAL_TABLET | Freq: Once | ORAL | Status: AC
  Administered 2019-12-26: 800 mg via ORAL
  Filled 2019-12-26: qty 1

## 2019-12-26 NOTE — ED Triage Notes (Signed)
Per ems, pt from home. Pt reports intermittent LUQ sharp abd pain that has gotten worse since yesterday. Pt concerned about it being a hernia d/t heavy lifting at work. Denies N/V/D. EMS VSS.

## 2019-12-26 NOTE — Discharge Instructions (Addendum)
As discussed, your evaluation today has been largely reassuring.  But, it is important that you monitor your condition carefully, and do not hesitate to return to the ED if you develop new, or concerning changes in your condition.  Otherwise, please follow-up with your physician for appropriate ongoing care.  In addition, today's findings have suggested the presence of a cyst on your left kidney, this may be hemorrhagic or breaking down, which is likely contributing to your pain.  Please follow-up with our urology colleagues in addition to your primary care physician.  Pain control with ibuprofen, 800 mg, taken up to 3 times daily or Tylenol, 650 mg, taken 3 times daily are both reasonable.

## 2019-12-26 NOTE — ED Provider Notes (Signed)
[facility] EMERGENCY DEPARTMENT Provider Note   CSN: [id number] Arrival date & time: 12/26/19  0415     History Chief Complaint  Patient presents with  . Abdominal Pain    Cory Floyd is a 44 y.o. male.  HPI    Patient presents with abdominal pain.  Pain is left-sided, began about 3 days ago, sore, severe.  No clear alleviating or exacerbating factors and it is unclear if the patient is taking any medication for pain relief.  There is some associated anorexia, no vomiting, no diarrhea.  Denies history of similar pain, clear precipitant. He denies other associated symptoms including chest pain, dyspnea.  Past Medical History:  Diagnosis Date  . 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 Anginal pain (HCC)   . Arthritis   . Bipolar 1 disorder (HCC)   . COVID-19   . Depressed   . Dyspnea   . Fatty liver   . Schizophrenia [facility])     Patient Active Problem List   Diagnosis Date Noted  . 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 Bipolar affective (HCC) 10/19/2019  . COVID-19 virus infection 10/04/2019  . Bipolar 1 disorder (HCC) 09/30/2019  . Bipolar affective disorder, mixed, severe, with psychotic behavior (HCC) 09/30/2019  . Back pain 09/30/2019  . Arthritis 09/30/2019  . Bipolar disorder without psychotic features (HCC) 09/18/2019  . Paranoia (HCC) 09/17/2019  . Psychosis (HCC) 09/17/2019  . Psychotic disorder (HCC) 09/17/2019  . Gastroesophageal reflux disease without esophagitis 09/21/2016  . Obstructive sleep apnea 09/21/2016  . Morbid obesity with BMI of 40.0-44.9, adult (HCC) 08/15/2016  . Stable angina pectoris (HCC) 08/15/2016    Past Surgical History:  Procedure Laterality Date  . COLONOSCOPY  02/05/2014   diverticulosis, hyperplastic polyp x 2  . fracture arm Right   . KNEE SURGERY Right 2013  . NASAL SEPTUM SURGERY    . NASAL TURBINATE REDUCTION Bilateral 12/12/2016   Procedure:  TURBINATE REDUCTION/SUBMUCOSAL RESECTION;  Surgeon: Linus Salmons, MD;  Location: [facility] ORS;  Service: ENT;  Laterality: Bilateral;       Family History  Problem Relation Age of Onset  . Hepatitis C Mother   . Cancer Mother   . Cancer Maternal Aunt   . Hypertension Maternal Grandmother     Social History   Tobacco Use  . Smoking status: Current Every Day Smoker    Packs/day: 1.00    Years: 20.00    Pack years: 20.00    Types: Cigarettes  . Smokeless tobacco: Never Used  Substance Use Topics  . Alcohol use: Yes    Comment: 1/2-5th pint of liquor  . Drug use: Yes    Types: Cocaine, Marijuana    Comment: heroin    Home Medications Prior to Admission medications   Medication Sig Start Date End Date Taking? Authorizing Provider  ARIPiprazole (ABILIFY) 30 MG tablet Take 1 tablet (30 mg total) by mouth daily. 10/28/19   Money, Gerlene Burdock, FNP  buPROPion (WELLBUTRIN XL) 150 MG 24 hr tablet Take 1 tablet (150 mg total) by mouth daily. 10/28/19   Money, Gerlene Burdock, FNP  divalproex (DEPAKOTE) 500 MG DR tablet Take 3 tablets (1,500 mg total) by mouth at bedtime. 10/28/19   Money, Gerlene Burdock, FNP  gabapentin (NEURONTIN) 600 MG tablet Take 0.5 tablets (300 mg total) by mouth 3 (three) times daily. 10/28/19   Money, Gerlene Burdock, FNP  hydrOXYzine (ATARAX/VISTARIL) 25 MG tablet Take 1  tablet (25 mg total) by mouth 3 (three) times daily as needed for anxiety. 10/28/19   Money, Lowry Ram, FNP  ibuprofen (ADVIL) 800 MG tablet Take 1 tablet (800 mg total) by mouth 3 (three) times daily. 12/23/19   Montine Circle, [name]  ondansetron (ZOFRAN) 4 MG tablet Take 1 tablet (4 mg total) by mouth every 8 (eight) hours as needed for nausea or vomiting. 12/23/19   Montine Circle, [name]  traZODone (DESYREL) 50 MG tablet Take 1 tablet (50 mg total) by mouth at bedtime as needed for sleep. 10/28/19   Money, Lowry Ram, FNP    Allergies    Carrot [daucus carota] and Penicillins  Review of Systems   Review of Systems    Constitutional:       Per HPI, otherwise negative  HENT:       Per HPI, otherwise negative  Respiratory:       Per HPI, otherwise negative  Cardiovascular:       Per HPI, otherwise negative  Gastrointestinal: Positive for abdominal pain. Negative for vomiting.  Endocrine:       Negative aside from HPI  Genitourinary:       Neg aside from HPI   Musculoskeletal:       Per HPI, otherwise negative  Skin: Negative.   Neurological: Negative for syncope.    Physical Exam Updated Vital Signs BP 124/84 (BP Location: Right Arm)   Pulse 80   Temp 98.6 F (37 C) (Oral)   Resp 18   Ht 5\' 10"  (1.778 m)   Wt 104.3 kg   SpO2 100%   BMI 33.00 kg/m   Physical Exam Vitals and nursing note reviewed.  Constitutional:      General: He is not in acute distress.    Appearance: He is well-developed.  HENT:     Head: Normocephalic and atraumatic.  Eyes:     Conjunctiva/sclera: Conjunctivae normal.  Cardiovascular:     Rate and Rhythm: Normal rate and regular rhythm.  Pulmonary:     Effort: Pulmonary effort is normal. No respiratory distress.     Breath sounds: No stridor.  Abdominal:     General: There is no distension.     Tenderness: There is abdominal tenderness in the left upper quadrant and left lower quadrant. There is guarding.  Skin:    General: Skin is warm and dry.  Neurological:     Mental Status: He is alert and oriented to person, place, and time.     ED Results / Procedures / Treatments   Labs (all labs ordered are listed, but only abnormal results are displayed) Labs Reviewed  COMPREHENSIVE METABOLIC PANEL - Abnormal; Notable for the following components:      Result Value   Glucose, Bld 118 (*)    All other components within normal limits  LIPASE, BLOOD  CBC  URINALYSIS, ROUTINE W REFLEX MICROSCOPIC    EKG None  Radiology No results found.  Procedures Procedures (including critical care time)  Medications Ordered in ED Medications  sodium  chloride flush (NS) 0.9 % injection 3 mL (has no administration in time range)  dicyclomine (BENTYL) injection 20 mg (has no administration in time range)    ED Course  I have reviewed the triage vital signs and the nursing notes.  Pertinent labs & imaging results that were available during my care of the patient were reviewed by me and considered in my medical decision making (see chart for details). Chart review after initial evaluation notable  for 8 prior ED visits in the past 6 months.  Initial labs generally reassuring, but with concern for his ongoing abdominal pain, anorexia, CT scan will be performed.  Bentyl ordered as well. Update: Initial CT demonstrates multiple small abnormalities, and I discussed the findings with the radiologist. With concern for left renal lesion, left-sided pain, ultrasound will be performed.  Patient has been able to urinate, no evidence for bladder obstruction.  Update: Patient appears calm.  We discussed the ultrasound results as well as his prior findings, generally reassuring, no evidence for bowel obstruction, no substantial lab abnormalities suggesting occult infection, no hemodynamic instability, and the patient is calm.  Discussed options for pain control and appropriate follow-up.  Patient declines offer of analgesics, requests 800 mg of ibuprofen.  Patient has a physician with whom you follow-up in [place], with provided resources to follow-up with her urologist here locally as well.  Final Clinical Impression(s) / ED Diagnoses Final diagnoses:  Left upper quadrant abdominal pain     Gerhard Munch, MD 12/26/19 1315

## 2019-12-26 NOTE — ED Notes (Signed)
Pt. Transported to imaging for CT

## 2019-12-26 NOTE — ED Notes (Signed)
Pt Returned from imaging

## 2020-01-28 ENCOUNTER — Other Ambulatory Visit: Payer: Self-pay

## 2020-01-28 ENCOUNTER — Emergency Department (HOSPITAL_COMMUNITY)
Admission: EM | Admit: 2020-01-28 | Discharge: 2020-01-28 | Payer: Medicare Other | Attending: Emergency Medicine | Admitting: Emergency Medicine

## 2020-01-28 ENCOUNTER — Encounter (HOSPITAL_COMMUNITY): Payer: Self-pay | Admitting: Student

## 2020-01-28 DIAGNOSIS — R7989 Other specified abnormal findings of blood chemistry: Secondary | ICD-10-CM | POA: Insufficient documentation

## 2020-01-28 DIAGNOSIS — Z79899 Other long term (current) drug therapy: Secondary | ICD-10-CM | POA: Insufficient documentation

## 2020-01-28 DIAGNOSIS — F17210 Nicotine dependence, cigarettes, uncomplicated: Secondary | ICD-10-CM | POA: Insufficient documentation

## 2020-01-28 DIAGNOSIS — R5383 Other fatigue: Secondary | ICD-10-CM | POA: Insufficient documentation

## 2020-01-28 DIAGNOSIS — R892 Abnormal level of other drugs, medicaments and biological substances in specimens from other organs, systems and tissues: Secondary | ICD-10-CM

## 2020-01-28 LAB — RAPID URINE DRUG SCREEN, HOSP PERFORMED
Amphetamines: POSITIVE — AB
Barbiturates: NOT DETECTED
Benzodiazepines: NOT DETECTED
Cocaine: NOT DETECTED
Opiates: NOT DETECTED
Tetrahydrocannabinol: NOT DETECTED

## 2020-01-28 LAB — COMPREHENSIVE METABOLIC PANEL
ALT: 23 U/L (ref 0–44)
AST: 29 U/L (ref 15–41)
Albumin: 3.6 g/dL (ref 3.5–5.0)
Alkaline Phosphatase: 67 U/L (ref 38–126)
Anion gap: 9 (ref 5–15)
BUN: 25 mg/dL — ABNORMAL HIGH (ref 6–20)
CO2: 26 mmol/L (ref 22–32)
Calcium: 9.3 mg/dL (ref 8.9–10.3)
Chloride: 107 mmol/L (ref 98–111)
Creatinine, Ser: 0.88 mg/dL (ref 0.61–1.24)
GFR calc Af Amer: >60 mL/min (ref >60)
GFR calc non Af Amer: >60 mL/min (ref >60)
Glucose, Bld: 110 mg/dL — ABNORMAL HIGH (ref 70–99)
Potassium: 4.5 mmol/L (ref 3.5–5.1)
Sodium: 142 mmol/L (ref 135–145)
Total Bilirubin: 0.3 mg/dL (ref 0.3–1.2)
Total Protein: 7.2 g/dL (ref 6.5–8.1)

## 2020-01-28 LAB — CBC
HCT: 38.8 % — ABNORMAL LOW (ref 39.0–52.0)
Hemoglobin: 12.4 g/dL — ABNORMAL LOW (ref 13.0–17.0)
MCH: 30.9 pg (ref 26.0–34.0)
MCHC: 32 g/dL (ref 30.0–36.0)
MCV: 96.8 fL (ref 80.0–100.0)
Platelets: 220 10*3/uL (ref 150–400)
RBC: 4.01 MIL/uL — ABNORMAL LOW (ref 4.22–5.81)
RDW: 13 % (ref 11.5–15.5)
WBC: 7.4 10*3/uL (ref 4.0–10.5)
nRBC: 0 % (ref 0.0–0.2)

## 2020-01-28 LAB — ETHANOL: Alcohol, Ethyl (B): <10 mg/dL (ref <10)

## 2020-01-28 MED ORDER — NALOXONE HCL 0.4 MG/ML IJ SOLN: 0.2000 mg | Freq: Once | INTRAMUSCULAR | Status: DC
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
## 2020-01-28 NOTE — ED Notes (Signed)
Pt given a sandwich and orange juice Pt being transported to jail by [employer]

## 2020-01-28 NOTE — ED Notes (Addendum)
Made Trifan aware that patient is becoming more lethargic, received orders.
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
## 2020-01-28 NOTE — Discharge Instructions (Addendum)
Your urine drug screen is positive for amphetamines today.

## 2020-02-27 ENCOUNTER — Emergency Department (HOSPITAL_COMMUNITY)
Admission: EM | Admit: 2020-02-27 | Discharge: 2020-02-28 | Disposition: A | Discharge status: Home / Self Care | Payer: Medicare Other | Attending: Emergency Medicine | Admitting: Emergency Medicine

## 2020-02-27 DIAGNOSIS — F250 Schizoaffective disorder, bipolar type: Secondary | ICD-10-CM | POA: Diagnosis not present

## 2020-02-27 DIAGNOSIS — F3010 Manic episode without psychotic symptoms, unspecified: Secondary | ICD-10-CM | POA: Diagnosis present

## 2020-02-27 DIAGNOSIS — F17210 Nicotine dependence, cigarettes, uncomplicated: Secondary | ICD-10-CM | POA: Diagnosis not present

## 2020-02-27 DIAGNOSIS — Z79899 Other long term (current) drug therapy: Secondary | ICD-10-CM | POA: Diagnosis not present

## 2020-02-27 DIAGNOSIS — Z20822 Contact with and (suspected) exposure to covid-19: Secondary | ICD-10-CM | POA: Insufficient documentation

## 2020-02-27 NOTE — ED Triage Notes (Signed)
Pt comes to ed via ems ( [employer] called out and found pt was wondering on [address], [employer] called ems for a well person check. Pt is alert x 4, " pt states he has not had his med's since may 5th and wants help". Pt history is bi poplar and depression. Pt speaks rapidly  and states non sense subject matter, and denies SI /HI.  V/s on arrival 112/palp, pluse 136, rr20, spo2 99 room air, cbg 252, temp 97.

## 2020-02-28 ENCOUNTER — Encounter (HOSPITAL_COMMUNITY): Payer: Self-pay | Admitting: Emergency Medicine

## 2020-02-28 DIAGNOSIS — F3010 Manic episode without psychotic symptoms, unspecified: Secondary | ICD-10-CM | POA: Diagnosis not present

## 2020-02-28 LAB — CBC WITH DIFFERENTIAL/PLATELET
Abs Immature Granulocytes: 0.01 10*3/uL (ref 0.00–0.07)
Basophils Absolute: 0.1 10*3/uL (ref 0.0–0.1)
Basophils Relative: 1 %
Eosinophils Absolute: 0.1 10*3/uL (ref 0.0–0.5)
Eosinophils Relative: 1 %
HCT: 42 % (ref 39.0–52.0)
Hemoglobin: 13.7 g/dL (ref 13.0–17.0)
Immature Granulocytes: 0 %
Lymphocytes Relative: 16 %
Lymphs Abs: 1.7 10*3/uL (ref 0.7–4.0)
MCH: 30.4 pg (ref 26.0–34.0)
MCHC: 32.6 g/dL (ref 30.0–36.0)
MCV: 93.3 fL (ref 80.0–100.0)
Monocytes Absolute: 0.8 10*3/uL (ref 0.1–1.0)
Monocytes Relative: 8 %
Neutro Abs: 8 10*3/uL — ABNORMAL HIGH (ref 1.7–7.7)
Neutrophils Relative %: 74 %
Platelets: 231 10*3/uL (ref 150–400)
RBC: 4.5 MIL/uL (ref 4.22–5.81)
RDW: 13.1 % (ref 11.5–15.5)
WBC: 10.7 10*3/uL — ABNORMAL HIGH (ref 4.0–10.5)
nRBC: 0 % (ref 0.0–0.2)

## 2020-02-28 LAB — COMPREHENSIVE METABOLIC PANEL
ALT: 26 U/L (ref 0–44)
AST: 36 U/L (ref 15–41)
Albumin: 5.1 g/dL — ABNORMAL HIGH (ref 3.5–5.0)
Alkaline Phosphatase: 68 U/L (ref 38–126)
Anion gap: 12 (ref 5–15)
BUN: 23 mg/dL — ABNORMAL HIGH (ref 6–20)
CO2: 27 mmol/L (ref 22–32)
Calcium: 10 mg/dL (ref 8.9–10.3)
Chloride: 103 mmol/L (ref 98–111)
Creatinine, Ser: 1.38 mg/dL — ABNORMAL HIGH (ref 0.61–1.24)
GFR calc Af Amer: >60 mL/min (ref >60)
GFR calc non Af Amer: >60 mL/min (ref >60)
Glucose, Bld: 85 mg/dL (ref 70–99)
Potassium: 3.7 mmol/L (ref 3.5–5.1)
Sodium: 142 mmol/L (ref 135–145)
Total Bilirubin: 0.9 mg/dL (ref 0.3–1.2)
Total Protein: 8.7 g/dL — ABNORMAL HIGH (ref 6.5–8.1)

## 2020-02-28 LAB — ETHANOL: Alcohol, Ethyl (B): <10 mg/dL (ref <10)

## 2020-02-28 LAB — RESPIRATORY PANEL BY RT PCR (FLU A&B, COVID)
Influenza A by PCR: NEGATIVE
Influenza B by PCR: NEGATIVE
SARS Coronavirus 2 by RT PCR: NEGATIVE

## 2020-02-28 MED ORDER — BUPROPION HCL ER (XL) 150 MG PO TB24
150.0000 mg | ORAL_TABLET | Freq: Every day | ORAL | Status: DC
  Administered 2020-02-28 (×2): 150 mg via ORAL
  Filled 2020-02-28 (×2): qty 1

## 2020-02-28 MED ORDER — TRAZODONE HCL 50 MG PO TABS
50.0000 mg | ORAL_TABLET | Freq: Every evening | ORAL | Status: DC | PRN
  Administered 2020-02-28: 50 mg via ORAL
  Filled 2020-02-28: qty 1

## 2020-02-28 MED ORDER — DIVALPROEX SODIUM 500 MG PO DR TAB
1500.0000 mg | DELAYED_RELEASE_TABLET | Freq: Every day | ORAL | Status: DC
  Administered 2020-02-28 (×2): 1500 mg via ORAL
  Filled 2020-02-28 (×2): qty 3

## 2020-02-28 MED ORDER — ONDANSETRON HCL 4 MG PO TABS: 4.0000 mg | ORAL_TABLET | Freq: Three times a day (TID) | ORAL | Status: DC | PRN

## 2020-02-28 MED ORDER — ARIPIPRAZOLE 10 MG PO TABS
30.0000 mg | ORAL_TABLET | Freq: Every day | ORAL | Status: DC
  Administered 2020-02-28 (×2): 30 mg via ORAL
  Filled 2020-02-28 (×2): qty 3

## 2020-02-28 MED ORDER — HYDROXYZINE HCL 25 MG PO TABS
25.0000 mg | ORAL_TABLET | Freq: Three times a day (TID) | ORAL | Status: DC | PRN
  Administered 2020-02-28: 25 mg via ORAL
  Filled 2020-02-28: qty 1

## 2020-02-28 NOTE — Progress Notes (Signed)
Patient meets criteria for inpatient treatment per Nira Conn, NP. No appropriate beds at [facility] currently. CSW faxed referrals to the following facilities for review:  [facility]   [facility]   [facility]  [facility]  [facility]   [facility]  [facility]   [facility]  [facility]   TTS will continue to seek bed placement.     Ruthann Cancer MSW, [facility] Social Worker Disposition  [facility] Ph: [phone] Fax: [phone] 02/28/2020 10:13 AM

## 2020-02-28 NOTE — ED Notes (Signed)
Attempted to obtain pt urine sample. Pt state he is unable to use the restroom at this time. Will continue to monitor pt status

## 2020-02-28 NOTE — ED Notes (Signed)
Patient speaking with TTS in room 7 at this time.

## 2020-02-28 NOTE — ED Notes (Signed)
Patient ate 100% of breakfast tray
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
## 2020-02-28 NOTE — Progress Notes (Addendum)
Consult request has been received. CSW attempting to follow up at present time.  Pt will be provided with following resources:  Substance Use Tx andf Homelessness/Shleter Resources:  [facility] (For outpatient substance use counseling) [address] ([address]) [place], [place] [address] Phone: [phone] Fax: [phone] Please arrive to the walk in clinic between the hours of 12pm to 3pm Monday/Wednesday and Friday for an assessment for medication management, intensive outpatient substance abuse treatment and group and individual therapy.   [facility] (if you want to return for psychiatric medication management.) [address] [place], [place] [address] Phone: [phone] Fax: [phone] Please arrive to the walk-in clinic Monday through Friday from 8-4pm for your assessment for your hospital follow up for medication management and therapy.   [facility] (For doctor's appointments) Address: [address] [place], [place] [address] Phone: [phone] Same-day appointments available for medical needs.  Call [phone] to schedule. Ask for an appointment to see a doctor and ask for assistance with registration for an "[employer]" which will allow one to receive reduced cost medical assistance and low-cost medications.  [facility] [place] (for therapy appointments) [address] [place], [place] [address] Phone: [phone] Crisis Line: Ginette Otto: [phone] [place], [place]: [phone]      Please go the I.R.C Monday at 8 am (the [facility] closes at 3 pm) for medical and housing assistance and ask to see:     1.      [place] with the medical team to be connected for medical services and free needed medications and ..   2.     The PATH team.  The P.A.T.H. team conducts outreach to clients to see patient's meet criteria for severe and persistent mental  illness (or co-occurring disorders) and chronic homelessness and once they are enrolled then the PATH team conducts assertive case management with the patient by connecting them to mental health, primary care, help with clothing, food, Medicaid applications, etc.  The P.A.T.H. team also conducts housing assessments and submits them to [facility] and advocates for the patient to receive vouchers for permanent housing and in the meantime provides transport or provides bus passes when appropriate.  You will be provided with two bus pass so that Monday you may go by bus.  If you are in need of a shelter please call [facility] ([facility]) between the hours of 9am-5pm Mon-Fri.  [facility] will contact all the local shelters to find openings.  Right now they are requiring people to quarantine at a hotel before they can go to an open bed but [facility] may be able to set that up as well.  They are not open on weekends, but Mon-Fri please call ph: [phone].    [facility]  [address] [place], [place] [address]  Phone: [phone]  Fax: [phone]     Please call Eber Jones in admitting at [phone] to schedule your appointment for a screening and an intake with [facility] to be admitted for a 30 day inpatient stay, if accepted at that time.  Please bring your I.D. and hospital discharge paperwork.    CSW will continue to follow for D/C needs.  Dorothe Pea. Veta Dambrosia  MSW, LCSW, LCAS, CSI Transitions of Care Clinical Social Worker Care Coordination Department Ph: [phone]

## 2020-02-28 NOTE — ED Notes (Signed)
Pt given orange juice and ham sandwich. Pt provided with scrubs to change in.

## 2020-02-28 NOTE — ED Notes (Signed)
Pt provided with burgundy scrubs to change out. Nurse has tried to assist pt with packing belongings into pt bags with labels but at this current time pt has requested a few more minutes to repack his bag. Will continue to monitor

## 2020-02-28 NOTE — ED Notes (Signed)
Patient in the hallway and is currently talking to himself, continues to pack/unpack his bag and reorganize belongings. Patient cooperative and calm at the moment. Will continue to monitor.

## 2020-02-28 NOTE — ED Provider Notes (Signed)
Blood pressure (!) 101/58, pulse 99, temperature 98.7 F (37.1 C), temperature source Oral, resp. rate 18, height 5\' 10"  (1.778 m), weight 88.5 kg, SpO2 100 %.  In short, Cory Floyd is a 45 y.o. male with a chief complaint of Manic Behavior (denies SI HI ) .  Refer to the original H&P for additional details.  Patient meets inpatient criteria. TTS looking for placement with BH at capacity.     47, MD 02/28/20 1301

## 2020-02-28 NOTE — ED Provider Notes (Signed)
[facility] DEPT Provider Note   CSN: [id number] Arrival date & time: 02/27/20  2335     History Chief Complaint  Patient presents with  . Manic Behavior    denies SI HI     Cory Floyd is a 44 y.o. male.  HPI    44yM with manic behavior. Brought in by ems after police called to check on patient. Pt tells me that he was the one that called police. He is a very difficult historian. Extremely disorganized and I'm having a hard time getting a coherent history. "I was staying in a drug house." He then had to leave because they were installing apps on his phone and spying on him. "They get in through the blue tooth. I know that sounds like some Cyril Mourning shit but they are." He generally seems pre-occupied with his telephones. Says he has been off of his psychiatric medications for two weeks. He is concerned about going to jail. He tells me he has warrants but not what for. Denies wanting to harm himself or anyone else.   Past Medical History:  Diagnosis Date  . Alcoholic ([place])    clean for 3 years  . Anginal pain ([place])   . Arthritis   . Bipolar 1 disorder ([place])   . COVID-19   . Depressed   . Dyspnea   . Fatty liver   . Schizophrenia [facility])     Patient Active Problem List   Diagnosis Date Noted  . Cocaine abuse with cocaine-induced psychotic disorder, with delusions ([place]) 10/20/2019  . Bipolar affective ([place]) 10/19/2019  . COVID-19 virus infection 10/04/2019  . Bipolar 1 disorder ([place]) 09/30/2019  . Bipolar affective disorder, mixed, severe, with psychotic behavior ([place]) 09/30/2019  . Back pain 09/30/2019  . Arthritis 09/30/2019  . Bipolar disorder without psychotic features ([place]) 09/18/2019  . Paranoia ([place]) 09/17/2019  . Psychosis ([place]) 09/17/2019  . Psychotic disorder ([place]) 09/17/2019  . Gastroesophageal reflux disease without esophagitis 09/21/2016  . Obstructive sleep apnea 09/21/2016  . Morbid obesity with BMI of 40.0-44.9, adult ([place])  08/15/2016  . Stable angina pectoris ([place]) 08/15/2016    Past Surgical History:  Procedure Laterality Date  . COLONOSCOPY  02/05/2014   diverticulosis, hyperplastic polyp x 2  . fracture arm Right   . KNEE SURGERY Right 2013  . NASAL SEPTUM SURGERY    . NASAL TURBINATE REDUCTION Bilateral 12/12/2016   Procedure: TURBINATE REDUCTION/SUBMUCOSAL RESECTION;  Surgeon: Beverly Gust, MD;  Location: [facility] ORS;  Service: ENT;  Laterality: Bilateral;       Family History  Problem Relation Age of Onset  . Hepatitis C Mother   . Cancer Mother   . Cancer Maternal Aunt   . Hypertension Maternal Grandmother     Social History   Tobacco Use  . Smoking status: Current Every Day Smoker    Packs/day: 1.00    Years: 20.00    Pack years: 20.00    Types: Cigarettes  . Smokeless tobacco: Never Used  Substance Use Topics  . Alcohol use: Yes    Comment: 1/2-5th pint of liquor  . Drug use: Yes    Types: Cocaine, Marijuana    Comment: heroin    Home Medications Prior to Admission medications   Medication Sig Start Date End Date Taking? Authorizing Provider  ARIPiprazole (ABILIFY) 30 MG tablet Take 1 tablet (30 mg total) by mouth daily. 10/28/19   Money, Lowry Ram, FNP  buPROPion (WELLBUTRIN XL) 150 MG 24 hr  tablet Take 1 tablet (150 mg total) by mouth daily. 10/28/19   Money, Gerlene Burdock, FNP  divalproex (DEPAKOTE) 500 MG DR tablet Take 3 tablets (1,500 mg total) by mouth at bedtime. 10/28/19   Money, Gerlene Burdock, FNP  gabapentin (NEURONTIN) 600 MG tablet Take 0.5 tablets (300 mg total) by mouth 3 (three) times daily. 10/28/19   Money, Gerlene Burdock, FNP  hydrOXYzine (ATARAX/VISTARIL) 25 MG tablet Take 1 tablet (25 mg total) by mouth 3 (three) times daily as needed for anxiety. 10/28/19   Money, Gerlene Burdock, FNP  ibuprofen (ADVIL) 800 MG tablet Take 1 tablet (800 mg total) by mouth 3 (three) times daily. 12/23/19   Roxy Horseman, [name]  ondansetron (ZOFRAN) 4 MG tablet Take 1 tablet (4 mg total) by mouth every 8  (eight) hours as needed for nausea or vomiting. 12/23/19   Roxy Horseman, [name]  traZODone (DESYREL) 50 MG tablet Take 1 tablet (50 mg total) by mouth at bedtime as needed for sleep. 10/28/19   Money, Gerlene Burdock, FNP    Allergies    Carrot [daucus carota] and Penicillins  Review of Systems   Review of Systems All systems reviewed and negative, other than as noted in HPI.  Physical Exam Updated Vital Signs SpO2 100%   Physical Exam Vitals and nursing note reviewed.  Constitutional:      General: He is not in acute distress.    Appearance: He is well-developed.  HENT:     Head: Normocephalic and atraumatic.  Eyes:     General:        Right eye: No discharge.        Left eye: No discharge.     Conjunctiva/sclera: Conjunctivae normal.  Cardiovascular:     Rate and Rhythm: Normal rate and regular rhythm.     Heart sounds: Normal heart sounds. No murmur. No friction rub. No gallop.   Pulmonary:     Effort: Pulmonary effort is normal. No respiratory distress.     Breath sounds: Normal breath sounds.  Abdominal:     General: There is no distension.     Palpations: Abdomen is soft.     Tenderness: There is no abdominal tenderness.  Musculoskeletal:        General: No tenderness.     Cervical back: Neck supple.  Skin:    General: Skin is warm and dry.  Neurological:     Mental Status: He is alert.  Psychiatric:     Comments: Poor eye contact. Speech somewhat pressured. Tangential. Poor insight.      ED Results / Procedures / Treatments   Labs (all labs ordered are listed, but only abnormal results are displayed) Labs Reviewed  COMPREHENSIVE METABOLIC PANEL - Abnormal; Notable for the following components:      Result Value   BUN 23 (*)    Creatinine, Ser 1.38 (*)    Total Protein 8.7 (*)    Albumin 5.1 (*)    All other components within normal limits  CBC WITH DIFFERENTIAL/PLATELET - Abnormal; Notable for the following components:   WBC 10.7 (*)    Neutro Abs 8.0 (*)      All other components within normal limits  RESPIRATORY PANEL BY RT PCR (FLU A&B, COVID)  ETHANOL    EKG None  Radiology No results found.  Procedures Procedures (including critical care time)  Medications Ordered in ED Medications  ARIPiprazole (ABILIFY) tablet 30 mg (has no administration in time range)  buPROPion (WELLBUTRIN XL) 24 hr tablet  150 mg (has no administration in time range)  hydrOXYzine (ATARAX/VISTARIL) tablet 25 mg (has no administration in time range)  divalproex (DEPAKOTE) DR tablet 1,500 mg (has no administration in time range)  traZODone (DESYREL) tablet 50 mg (has no administration in time range)  ondansetron (ZOFRAN) tablet 4 mg (has no administration in time range)    ED Course  I have reviewed the triage vital signs and the nursing notes.  Pertinent labs & imaging results that were available during my care of the patient were reviewed by me and considered in my medical decision making (see chart for details).    MDM Rules/Calculators/A&P                      44yM with psychiatric history and seems decompensated while off his meds. Denis SI or HI but seems manic. Will restart home meds. WIll medically clear for TTS evaluation.  Final Clinical Impression(s) / ED Diagnoses Final diagnoses:  Manic behavior (HCC)    Rx / DC Orders ED Discharge Orders    None       Raeford Razor, MD 03/01/20 1144

## 2020-03-02 ENCOUNTER — Emergency Department (HOSPITAL_COMMUNITY)
Admission: EM | Admit: 2020-03-02 | Discharge: 2020-03-03 | Disposition: A | Discharge status: Home / Self Care | Payer: Medicare Other | Attending: Emergency Medicine | Admitting: Emergency Medicine

## 2020-03-02 ENCOUNTER — Encounter (HOSPITAL_COMMUNITY): Payer: Self-pay | Admitting: Emergency Medicine

## 2020-03-02 DIAGNOSIS — F17210 Nicotine dependence, cigarettes, uncomplicated: Secondary | ICD-10-CM | POA: Diagnosis not present

## 2020-03-02 DIAGNOSIS — F22 Delusional disorders: Secondary | ICD-10-CM | POA: Diagnosis present

## 2020-03-02 DIAGNOSIS — F209 Schizophrenia, unspecified: Secondary | ICD-10-CM | POA: Insufficient documentation

## 2020-03-02 DIAGNOSIS — F1410 Cocaine abuse, uncomplicated: Secondary | ICD-10-CM | POA: Diagnosis not present

## 2020-03-02 DIAGNOSIS — F1210 Cannabis abuse, uncomplicated: Secondary | ICD-10-CM | POA: Insufficient documentation

## 2020-03-02 DIAGNOSIS — Z046 Encounter for general psychiatric examination, requested by authority: Secondary | ICD-10-CM | POA: Diagnosis present

## 2020-03-02 DIAGNOSIS — F1510 Other stimulant abuse, uncomplicated: Secondary | ICD-10-CM | POA: Insufficient documentation

## 2020-03-02 DIAGNOSIS — R45851 Suicidal ideations: Secondary | ICD-10-CM | POA: Diagnosis not present

## 2020-03-02 DIAGNOSIS — F14150 Cocaine abuse with cocaine-induced psychotic disorder with delusions: Secondary | ICD-10-CM | POA: Diagnosis present

## 2020-03-02 DIAGNOSIS — F29 Unspecified psychosis not due to a substance or known physiological condition: Secondary | ICD-10-CM | POA: Diagnosis present

## 2020-03-02 DIAGNOSIS — F19959 Other psychoactive substance use, unspecified with psychoactive substance-induced psychotic disorder, unspecified: Secondary | ICD-10-CM | POA: Diagnosis present

## 2020-03-02 DIAGNOSIS — F330 Major depressive disorder, recurrent, mild: Secondary | ICD-10-CM | POA: Diagnosis not present

## 2020-03-02 LAB — CBC WITH DIFFERENTIAL/PLATELET
Abs Immature Granulocytes: 0.01 10*3/uL (ref 0.00–0.07)
Basophils Absolute: 0 10*3/uL (ref 0.0–0.1)
Basophils Relative: 0 %
Eosinophils Absolute: 0.1 10*3/uL (ref 0.0–0.5)
Eosinophils Relative: 1 %
HCT: 38.8 % — ABNORMAL LOW (ref 39.0–52.0)
Hemoglobin: 12.6 g/dL — ABNORMAL LOW (ref 13.0–17.0)
Immature Granulocytes: 0 %
Lymphocytes Relative: 15 %
Lymphs Abs: 1 10*3/uL (ref 0.7–4.0)
MCH: 30.9 pg (ref 26.0–34.0)
MCHC: 32.5 g/dL (ref 30.0–36.0)
MCV: 95.1 fL (ref 80.0–100.0)
Monocytes Absolute: 0.4 10*3/uL (ref 0.1–1.0)
Monocytes Relative: 6 %
Neutro Abs: 5.1 10*3/uL (ref 1.7–7.7)
Neutrophils Relative %: 78 %
Platelets: 166 10*3/uL (ref 150–400)
RBC: 4.08 MIL/uL — ABNORMAL LOW (ref 4.22–5.81)
RDW: 13 % (ref 11.5–15.5)
WBC: 6.6 10*3/uL (ref 4.0–10.5)
nRBC: 0 % (ref 0.0–0.2)

## 2020-03-02 LAB — BASIC METABOLIC PANEL
Anion gap: 9 (ref 5–15)
BUN: 10 mg/dL (ref 6–20)
CO2: 29 mmol/L (ref 22–32)
Calcium: 9.2 mg/dL (ref 8.9–10.3)
Chloride: 104 mmol/L (ref 98–111)
Creatinine, Ser: 0.82 mg/dL (ref 0.61–1.24)
GFR calc Af Amer: >60 mL/min (ref >60)
GFR calc non Af Amer: >60 mL/min (ref >60)
Glucose, Bld: 95 mg/dL (ref 70–99)
Potassium: 3.7 mmol/L (ref 3.5–5.1)
Sodium: 142 mmol/L (ref 135–145)

## 2020-03-02 LAB — RAPID URINE DRUG SCREEN, HOSP PERFORMED
Amphetamines: POSITIVE — AB
Barbiturates: NOT DETECTED
Benzodiazepines: NOT DETECTED
Cocaine: POSITIVE — AB
Opiates: NOT DETECTED
Tetrahydrocannabinol: NOT DETECTED

## 2020-03-02 LAB — ACETAMINOPHEN LEVEL: Acetaminophen (Tylenol), Serum: <10 ug/mL — ABNORMAL LOW (ref 10–30)

## 2020-03-02 LAB — SALICYLATE LEVEL: Salicylate Lvl: <7 mg/dL — ABNORMAL LOW (ref 7.0–30.0)

## 2020-03-02 LAB — ETHANOL: Alcohol, Ethyl (B): <10 mg/dL (ref <10)
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
## 2020-03-02 NOTE — ED Triage Notes (Signed)
Per pt, states he went to [facility] to see about getting medication-states he was released last week but has not gotten any meds-states people are following him and he does not feel safe
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
## 2020-03-02 NOTE — ED Notes (Signed)
TTS computer consult bedside.

## 2020-03-02 NOTE — BH Assessment (Signed)
Tele Assessment Note   Patient Name: Cory Floyd MRN: [id number] Referring Physician: Coral Ceo, PA Location of Patient: [facility] Location of Provider: [place] Department  Cory Floyd is an 44 y.o. male brought into ED by [employer] due to paranoia. Patient stated, "I went an asked them for help and they thought I was crazy, I only stayed here because I gave my word to one of the officers". Patient then stated, "I only came to [facility] to check out". Patient stated "I have a plot next to [facility] and its all paid for and don't think I am not going to take someone with me". Patient refused to give specifics of individual persons. Patient admitted to current SI with plan to use gun. Patient reported having access to guns. Patient hx of inpatient psych treatment, 10/19/19, 09/30/19 and 09/17/19 at [facility]. Patient reported history of 3x suicide attempts, including drinking large of amounts of alcohol and swallowing a full bottle of trazodone. Patient reported auditory and visual hallucinations, however patient shared no additional information. Patient reported increased depressive symptoms. Patient reported 2-3 hours sleep nightly due to not taking medications.   Patient reported receiving medication management from [facility]. Patient unable to remember providers name. Patient reported last being seen in 07/2019 and 09/2019. Patient reported not taking prescribed psych medications.  Patient reported being homeless. Patient has 2 adult daughters, which he reported his chose to stop all contact with them. Per patients medical record he has a history of suicidal ideation, auditory hallucinations and paranoia. Patient received outpatient treatment at [facility]. Patient was cooperative during assessment.   PER TRIAGE NOTE: Per pt, states he went to [facility] to see about getting medication-states he was released last week but has not gotten any meds-states people are following him and he  does not feel safe.   Diagnosis: Major depressive disorder  Past Medical History:  Past Medical History:  Diagnosis Date  . 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 Anginal pain ([place])   . Arthritis   . Bipolar 1 disorder ([place])   . COVID-19   . Depressed   . Dyspnea   . Fatty liver   . Schizophrenia [facility])     Past Surgical History:  Procedure Laterality Date  . COLONOSCOPY  02/05/2014   diverticulosis, hyperplastic polyp x 2  . fracture arm Right   . KNEE SURGERY Right 2013  . NASAL SEPTUM SURGERY    . NASAL TURBINATE REDUCTION Bilateral 12/12/2016   Procedure: TURBINATE REDUCTION/SUBMUCOSAL RESECTION;  Surgeon: Beverly Gust, MD;  Location: [facility] ORS;  Service: ENT;  Laterality: Bilateral;    Family History:  Family History  Problem Relation Age of Onset  . Hepatitis C Mother   . Cancer Mother   . Cancer Maternal Aunt   . Hypertension Maternal Grandmother     Social History:  reports that he has been smoking cigarettes. He has a 20.00 pack-year smoking history. He has never used smokeless tobacco. He reports current alcohol use. He reports current drug use. Drugs: Cocaine and Marijuana.  Additional Social History:  Alcohol / Drug Use Pain Medications: see MAR Prescriptions: see MAR Over the Counter: see MAR  CIWA: CIWA-Ar BP: (!) 137/94 Pulse Rate: 93 COWS:    Allergies:  Allergies  Allergen Reactions  . Carrot [Daucus [place]] Anaphylaxis and Rash  . Penicillins Anaphylaxis and Hives    Has patient had a PCN reaction causing immediate rash, facial/tongue/throat swelling, SOB or lightheadedness with  hypotension: Yes Has patient had a PCN reaction causing severe rash involving mucus membranes or skin necrosis: No Has patient had a PCN reaction that required hospitalization Yes Has patient had a PCN reaction occurring within the last 10 years: No If all of the above answers are "NO", then may proceed with Cephalosporin use.     Home Medications: (Not in a  hospital admission)   OB/GYN Status:  No LMP for male patient.  General Assessment Data [facility] of Assessment: [facility] TTS Assessment: In system Is this a Tele or Face-to-Face Assessment?: Tele Assessment Is this an Initial Assessment or a Re-assessment for this encounter?: Initial Assessment Patient Accompanied by:: N/A Language Other than English: No Living Arrangements: Homeless/Shelter What gender do you identify as?: Male Marital status: Single Maiden name: (n/a) Pregnancy Status: (n/a) Living Arrangements: (homeless) Can pt return to current living arrangement?: Yes Admission Status: Voluntary Is patient capable of signing voluntary admission?: Yes Referral Source: M[employer])  Crisis Care Plan Living Arrangements: (homeless) Legal Guardian: (self) Name of Psychiatrist: [facility] Name of Therapist: None  Education Status Is patient currently in school?: No Is the patient employed, unemployed or receiving disability?: Receiving disability income  Risk to self with the past 6 months Has patient been a risk to self within the past 6 months prior to admission? : No Suicidal Intent: Yes-Currently Present Is patient at risk for suicide?: Yes Suicidal Plan?: Yes-Currently Present Has patient had any suicidal plan within the past 6 months prior to admission? : No Specify Current Suicidal Plan: (shoot self with gun) Access to Means: Yes Specify Access to Suicidal Means: (patient reported yes to access) What has been your use of drugs/alcohol within the last 12 months?: (none) Previous Attempts/Gestures: Yes How many times?: (3x) Other Self Harm Risks: (none) Triggers for Past Attempts: Unknown Intentional Self Injurious Behavior: (hx of cutting) Comment - Self Injurious Behavior: (hx of cutting) Family Suicide History: Yes(uncle and great aunt) Recent stressful life event(s): (homeless) Persecutory voices/beliefs?: No Depression: Yes Depression Symptoms:  Insomnia, Feeling angry/irritable Substance abuse history and/or treatment for substance abuse?: Yes Suicide prevention information given to non-admitted patients: Not applicable  Risk to Others within the past 6 months Homicidal Ideation: Yes-Currently Present Does patient have any lifetime risk of violence toward others beyond the six months prior to admission? : Unknown Thoughts of Harm to Others: Yes-Currently Present Comment - Thoughts of Harm to Others: (specifics declined) Current Homicidal Intent: Yes-Currently Present Current Homicidal Plan: Yes-Currently Present Describe Current Homicidal Plan: ("off them") Access to Homicidal Means: Yes Describe Access to Homicidal Means: (patient reported access to guns) Identified Victim: (patient refused) History of harm to others?: (unknown) Assessment of Violence: None Noted Violent Behavior Description: (no known history) Does patient have access to weapons?: Yes (Comment) Criminal Charges Pending?: No Does patient have a court date: Yes Court Date: (06/10/20) Is patient on probation?: No  Psychosis Hallucinations: Auditory, Visual Delusions: Unspecified  Mental Status Report Appearance/Hygiene: Unremarkable Eye Contact: Fair Motor Activity: Freedom of movement Speech: Soft, Slow Level of Consciousness: Alert Mood: Depressed Affect: Depressed, Appropriate to circumstance Anxiety Level: Minimal Thought Processes: Relevant, Coherent Judgement: Impaired Orientation: Person, Time, Place, Situation Obsessive Compulsive Thoughts/Behaviors: None  Cognitive Functioning Concentration: Good Memory: Recent Intact Is patient IDD: No Insight: Poor Impulse Control: Poor Appetite: Fair Have you had any weight changes? : No Change Sleep: Decreased Total Hours of Sleep: (2-3) Vegetative Symptoms: None  ADLScreening [facility]) Patient's cognitive ability adequate to safely complete  daily activities?: Yes Patient  able to express need for assistance with ADLs?: Yes Independently performs ADLs?: Yes (appropriate for developmental age)  Prior Inpatient Therapy Prior Inpatient Therapy: Yes Prior Therapy Dates: 09/2019, multiple admits Prior Therapy Facilty/Provider(s): [facility], other facilities Reason for Treatment: Schizoaffective disorder  Prior Outpatient Therapy Prior Outpatient Therapy: Yes Prior Therapy Dates: Current Prior Therapy Facilty/Provider(s): [facility] Reason for Treatment: Medication management Does patient have an ACCT team?: No Does patient have Intensive In-House Services?  : No Does patient have [facility] services? : No Does patient have [facility] services?: No  ADL Screening (condition at time of admission) Patient's cognitive ability adequate to safely complete daily activities?: Yes Patient able to express need for assistance with ADLs?: Yes Independently performs ADLs?: Yes (appropriate for developmental age)  Disposition:  Disposition Initial Assessment Completed for this Encounter: Yes  Renaye Rakers, NP, patient meets inpatient criteria. TTS to secure placement.   This service was provided via telemedicine using a 2-way, interactive audio and video technology.  Names of all persons participating in this telemedicine service and their role in this encounter. Name: Cory Floyd Role: Patient  Name: Al Corpus Role: TTS Clinician  Name:  Role:   Name:  Role:     Burnetta Sabin 03/02/2020 10:14 PM

## 2020-03-02 NOTE — ED Notes (Signed)
Patient ambulated to RR w/o assistance to provide urine sample, oxygen stayed 98-100% RA during the walk.

## 2020-03-03 ENCOUNTER — Encounter (HOSPITAL_COMMUNITY): Payer: Self-pay | Admitting: Registered Nurse

## 2020-03-03 DIAGNOSIS — F330 Major depressive disorder, recurrent, mild: Secondary | ICD-10-CM | POA: Diagnosis not present

## 2020-03-03 MED ORDER — GABAPENTIN 300 MG PO CAPS
300.0000 mg | ORAL_CAPSULE | Freq: Three times a day (TID) | ORAL | Status: DC
  Administered 2020-03-03: 300 mg via ORAL
  Filled 2020-03-03: qty 1

## 2020-03-03 MED ORDER — ARIPIPRAZOLE 10 MG PO TABS
30.0000 mg | ORAL_TABLET | Freq: Every day | ORAL | Status: DC
  Administered 2020-03-03: 30 mg via ORAL
  Filled 2020-03-03: qty 3

## 2020-03-03 MED ORDER — BUPROPION HCL ER (XL) 150 MG PO TB24
150.0000 mg | ORAL_TABLET | Freq: Every day | ORAL | Status: DC
  Administered 2020-03-03: 150 mg via ORAL
  Filled 2020-03-03: qty 1

## 2020-03-03 MED ORDER — HYDROXYZINE HCL 25 MG PO TABS: 25.0000 mg | ORAL_TABLET | Freq: Three times a day (TID) | ORAL | Status: DC | PRN

## 2020-03-03 MED ORDER — DIVALPROEX SODIUM 500 MG PO DR TAB
1500.0000 mg | DELAYED_RELEASE_TABLET | Freq: Every day | ORAL | Status: DC
  Administered 2020-03-03: 1500 mg via ORAL
  Filled 2020-03-03: qty 3

## 2020-03-03 MED ORDER — TRAZODONE HCL 50 MG PO TABS: 50.0000 mg | ORAL_TABLET | Freq: Every evening | ORAL | Status: DC | PRN

## 2020-03-03 NOTE — Discharge Instructions (Signed)
For your behavioral health needs you are advised to follow up with an outpatient provider.  Contact one of the following agencies at your earliest opportunity to schedule an intake appointment:  IN [place]:       [facility]      [address]      [place], [place] [address]      [phone]      Crisis number: [phone]  IN [place]:       [facility]      [address]      [place], [place] [address]      [phone]

## 2020-03-03 NOTE — BH Assessment (Signed)
[facility] Assessment Progress Note  Per Shuvon Rankin, FNP, this pt does not require psychiatric hospitalization at this time.  Pt is to be discharged from [facility] with outpatient referrals.  Discharge instructions advise pt to follow up with [facility] in the [place] area, or [facility] in [place], pt's identified community of residence.  Pt would also benefit from seeing Peer Support Specialists, and a peer support consult has been ordered for pt..  Pt's nurse has been notified.  Doylene Canning, MA Triage Specialist [phone]

## 2020-03-03 NOTE — Consult Note (Signed)
[facility] Psych ED Discharge  03/03/2020 1:21 PM Cory Floyd  MRN:  [id number] Principal Problem: Cocaine abuse with cocaine-induced psychotic disorder, with delusions [facility]) Discharge Diagnoses: Principal Problem:   Cocaine abuse with cocaine-induced psychotic disorder, with delusions (HCC) Active Problems:   Paranoia (HCC)   Psychosis (HCC)   Subjective: "Yesterday the house I was staying at; some guys sitting around doing drugs; I wasn't; but they wanted to set me up for a sex crime I did do.  I had to leave."  Assessment:  Cory Floyd, 44 y.o., male patient seen via tele psych by this provider, Dr. Lucianne Muss; and chart reviewed on 03/03/20.  On evaluation Cory Floyd reports he came to the hospital because he was feeling paranoid that the people he was staying with was trying to set him up.  Patient first stated he was not using any illicit drugs when informed him of the positive UDS, he then admitted to cocaine and meth.  Discussed both substances can cause paranoia and psychosis.  At this time patient denies suicidal/self-harm/homicidal ideation, psychosis, and paranoia.  Patient states that he is interested in rehab and substance abuse services but it may be difficult related to having a previous sex offender charge.  Patient states that he does not currently have outpatient psychiatric services but is interested.    During evaluation Cory Floyd is alert/oriented x 4; calm/cooperative; and mood is congruent with affect.  He does not appear to be responding to internal/external stimuli or delusional thoughts.  Patient denies suicidal/self-harm/homicidal ideation, psychosis, and paranoia.  Patient answered question appropriately.  Peer support ordered to assist patient with substance use services rehab/community.  Patient also referred to outpatient psychiatric services.     Total Time spent with patient: 30 minutes  Past Psychiatric History: Polysubstance abuse (see above)  Past Medical  History:  Past Medical History:  Diagnosis Date  . 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 Anginal pain (HCC)   . Arthritis   . Bipolar 1 disorder (HCC)   . COVID-19   . Depressed   . Dyspnea   . Fatty liver   . Schizophrenia [facility])     Past Surgical History:  Procedure Laterality Date  . COLONOSCOPY  02/05/2014   diverticulosis, hyperplastic polyp x 2  . fracture arm Right   . KNEE SURGERY Right 2013  . NASAL SEPTUM SURGERY    . NASAL TURBINATE REDUCTION Bilateral 12/12/2016   Procedure: TURBINATE REDUCTION/SUBMUCOSAL RESECTION;  Surgeon: Linus Salmons, MD;  Location: [facility] ORS;  Service: ENT;  Laterality: Bilateral;   Family History:  Family History  Problem Relation Age of Onset  . Hepatitis C Mother   . Cancer Mother   . Cancer Maternal Aunt   . Hypertension Maternal Grandmother    Family Psychiatric  History: Denies Social History:  Social History   Substance and Sexual Activity  Alcohol Use Yes   Comment: 1/2-5th pint of liquor     Social History   Substance and Sexual Activity  Drug Use Yes  . Types: Cocaine, Marijuana   Comment: heroin    Social History   Socioeconomic History  . Marital status: Divorced    Spouse name: Not on file  . Number of children: Not on file  . Years of education: Not on file  . Highest education level: Not on file  Occupational History  . Not on file  Tobacco Use  . Smoking status: Current Every Day Smoker  Packs/day: 1.00    Years: 20.00    Pack years: 20.00    Types: Cigarettes  . Smokeless tobacco: Never Used  Substance and Sexual Activity  . Alcohol use: Yes    Comment: 1/2-5th pint of liquor  . Drug use: Yes    Types: Cocaine, Marijuana    Comment: heroin  . Sexual activity: Yes    Birth control/protection: None  Other Topics Concern  . Not on file  Social History Narrative  . Not on file   Social Determinants of Health   Financial Resource Strain:   . Difficulty of Paying Living Expenses:   Food  Insecurity:   . Worried About Charity fundraiser in the Last Year:   . Arboriculturist in the Last Year:   Transportation Needs:   . Film/video editor (Medical):   [name] Lack of Transportation (Non-Medical):   Physical Activity:   . Days of Exercise per Week:   . Minutes of Exercise per Session:   Stress:   . Feeling of Stress :   Social Connections:   . Frequency of Communication with Friends and Family:   . Frequency of Social Gatherings with Friends and Family:   . Attends Religious Services:   . Active Member of Clubs or Organizations:   . Attends Archivist Meetings:   [name] Marital Status:     Has this patient used any form of tobacco in the last 30 days? (Cigarettes, Smokeless Tobacco, Cigars, and/or Pipes) A prescription for an FDA-approved tobacco cessation medication was offered at discharge and the patient refused  Current Medications: Current Facility-Administered Medications  Medication Dose Route Frequency Provider Last Rate Last Admin  . ARIPiprazole (ABILIFY) tablet 30 mg  30 mg Oral Daily Joy, Shawn C, [name]   30 mg at 03/03/20 1026  . buPROPion (WELLBUTRIN XL) 24 hr tablet 150 mg  150 mg Oral Daily Joy, Shawn C, [name]   150 mg at 03/03/20 1026  . divalproex (DEPAKOTE) DR tablet 1,500 mg  1,500 mg Oral QHS Joy, Shawn C, [name]   1,500 mg at 03/03/20 0428  . gabapentin (NEURONTIN) capsule 300 mg  300 mg Oral TID Joy, Shawn C, [name]   300 mg at 03/03/20 1026  . hydrOXYzine (ATARAX/VISTARIL) tablet 25 mg  25 mg Oral TID PRN Joy, Shawn C, [name]      . traZODone (DESYREL) tablet 50 mg  50 mg Oral QHS PRN Joy, Shawn C, [name]       Current Outpatient Medications  Medication Sig Dispense Refill  . ARIPiprazole (ABILIFY) 30 MG tablet Take 1 tablet (30 mg total) by mouth daily. (Patient not taking: Reported on 03/02/2020) 30 tablet 1  . buPROPion (WELLBUTRIN XL) 150 MG 24 hr tablet Take 1 tablet (150 mg total) by mouth daily. (Patient not taking: Reported on 03/02/2020) 30  tablet 1  . divalproex (DEPAKOTE) 500 MG DR tablet Take 3 tablets (1,500 mg total) by mouth at bedtime. (Patient not taking: Reported on 03/02/2020) 90 tablet 1  . gabapentin (NEURONTIN) 600 MG tablet Take 0.5 tablets (300 mg total) by mouth 3 (three) times daily. (Patient not taking: Reported on 03/02/2020) 45 tablet 1  . hydrOXYzine (ATARAX/VISTARIL) 25 MG tablet Take 1 tablet (25 mg total) by mouth 3 (three) times daily as needed for anxiety. (Patient not taking: Reported on 03/02/2020) 30 tablet 1  . traZODone (DESYREL) 50 MG tablet Take 1 tablet (50 mg total) by mouth at bedtime as needed for  sleep. (Patient not taking: Reported on 03/02/2020) 30 tablet 1   PTA Medications: (Not in a hospital admission)   Musculoskeletal: Strength & Muscle Tone: within normal limits Gait & Station: normal Patient leans: N/A  Psychiatric Specialty Exam: Physical Exam  Review of Systems  Blood pressure 115/77, pulse 83, temperature 97.8 F (36.6 C), temperature source Oral, resp. rate 16, SpO2 100 %.There is no height or weight on file to calculate BMI.  General Appearance: Casual  Eye Contact:  Good  Speech:  Clear and Coherent and Normal Rate  Volume:  Normal  Mood:  "Fine"  Affect:  Appropriate and Congruent  Thought Process:  Coherent, Goal Directed and Descriptions of Associations: Intact  Orientation:  Full (Time, Place, and Person)  Thought Content:  WDL  Suicidal Thoughts:  No  Homicidal Thoughts:  No  Memory:  Immediate;   Good Recent;   Good  Judgement:  Intact  Insight:  Present  Psychomotor Activity:  Normal  Concentration:  Concentration: Good and Attention Span: Good  Recall:  Good  Fund of Knowledge:  Good  Language:  Good  Akathisia:  No  Handed:  Right  AIMS (if indicated):     Assets:  Communication Skills Desire for Improvement  ADL's:  Intact  Cognition:  WNL  Sleep:        Demographic Factors:  Male, Caucasian, Low socioeconomic status and Living alone  Loss  Factors: NA  Historical Factors: Impulsivity  Risk Reduction Factors:   Religious beliefs about death  Continued Clinical Symptoms:  Alcohol/Substance Abuse/Dependencies Previous Psychiatric Diagnoses and Treatments  Cognitive Features That Contribute To Risk:  Closed-mindedness    Suicide Risk:  Minimal: No identifiable suicidal ideation.  Patients presenting with no risk factors but with morbid ruminations; may be classified as minimal risk based on the severity of the depressive symptoms  Follow-up Information    [facility]. Schedule an appointment as soon as possible for a visit.   Contact information: [address] [place] [address] [phone]        [facility]. Schedule an appointment as soon as possible for a visit.   Specialty: [facility] information: [address] [place] [address] [phone]           Plan Of Care/Follow-up recommendations:  Activity:  As tolerated Diet:  Heart healthy  Disposition:  Psychiatrically cleared No evidence of imminent risk to self or others at present.   Patient does not meet criteria for psychiatric inpatient admission. Supportive therapy provided about ongoing stressors. Discussed crisis plan, support from social network, calling 911, coming to the Emergency Department, and calling Suicide Hotline.  Jaun Galluzzo, NP 03/03/2020, 1:21 PM

## 2020-03-03 NOTE — ED Notes (Signed)
Anike Adaku, NP, patient meets inpatient criteria. TTS to secure placement.  

## 2020-03-03 NOTE — ED Provider Notes (Signed)
Emergency Medicine Observation Re-evaluation Note  Cory Floyd is a 44 y.o. male, seen on rounds today.  Pt initially presented to the ED for complaints of Delusional and Suicidal Currently, the patient is inpatient hospitalization.  Physical Exam  BP 129/79   Pulse 79   Temp 98.2 F (36.8 C) (Oral)   Resp 20   SpO2 100%  Physical Exam  ED Course / MDM  EKG:  Clinical Course as of Mar 03 816  Tue Mar 02, 2020  2055 RN called [facility], spoke with Berna Spare. States there are three patients ahead of this patient, but they will get to him as soon as they can.   [SJ]    Clinical Course User Index [SJ] Joy, Shawn C, [name]   I have reviewed the labs performed to date as well as medications administered while in observation.  Recent changes in the last 24 hours include patient stable . Plan  Current plan is for placement. Patient is not under full IVC at this time.   Lorre Nick, MD 03/03/20 [phone]

## 2020-03-03 NOTE — ED Notes (Signed)
He said he would not sign and did not want his paperwork. Refused vitals.

## 2020-03-04 ENCOUNTER — Other Ambulatory Visit: Payer: Self-pay

## 2020-03-04 ENCOUNTER — Observation Stay (HOSPITAL_COMMUNITY)
Admission: AD | Admit: 2020-03-04 | Discharge: 2020-03-07 | Disposition: A | Discharge status: Home / Self Care | Payer: Medicare Other | Attending: Psychiatry | Admitting: Psychiatry

## 2020-03-04 ENCOUNTER — Encounter (HOSPITAL_COMMUNITY): Payer: Self-pay | Admitting: Psychiatry

## 2020-03-04 DIAGNOSIS — M1990 Unspecified osteoarthritis, unspecified site: Secondary | ICD-10-CM | POA: Diagnosis not present

## 2020-03-04 DIAGNOSIS — Z590 Homelessness: Secondary | ICD-10-CM | POA: Diagnosis not present

## 2020-03-04 DIAGNOSIS — K760 Fatty (change of) liver, not elsewhere classified: Secondary | ICD-10-CM | POA: Insufficient documentation

## 2020-03-04 DIAGNOSIS — F319 Bipolar disorder, unspecified: Secondary | ICD-10-CM | POA: Diagnosis not present

## 2020-03-04 DIAGNOSIS — F17210 Nicotine dependence, cigarettes, uncomplicated: Secondary | ICD-10-CM | POA: Insufficient documentation

## 2020-03-04 DIAGNOSIS — Z79899 Other long term (current) drug therapy: Secondary | ICD-10-CM | POA: Insufficient documentation

## 2020-03-04 DIAGNOSIS — Z20822 Contact with and (suspected) exposure to covid-19: Secondary | ICD-10-CM | POA: Diagnosis not present

## 2020-03-04 DIAGNOSIS — F3132 Bipolar disorder, current episode depressed, moderate: Secondary | ICD-10-CM

## 2020-03-04 DIAGNOSIS — F209 Schizophrenia, unspecified: Secondary | ICD-10-CM | POA: Insufficient documentation

## 2020-03-04 DIAGNOSIS — F3164 Bipolar disorder, current episode mixed, severe, with psychotic features: Secondary | ICD-10-CM | POA: Diagnosis present

## 2020-03-04 LAB — SARS CORONAVIRUS 2 BY RT PCR (HOSPITAL ORDER, PERFORMED IN ~~LOC~~ HOSPITAL LAB): SARS Coronavirus 2: NEGATIVE
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
## 2020-03-04 NOTE — Plan of Care (Signed)
[facility] Observation Crisis Plan  Reason for Crisis Plan:  Crisis Stabilization   Plan of Care:  Referral for Inpatient Hospitalization  Family Support:      Current Living Environment:  Living Arrangements: (homeless)  Insurance:   Hospital Account    Name Acct ID Class Status Primary Coverage   Carvel, Huskins [id number] BEHAVIORAL HEALTH OBSERVATION Open MEDICARE - MEDICARE PART A AND B        Guarantor Account (for Hospital Account [id number])    Name Relation to Pt Service Area Active? Acct Type   [name] Self [facility] Yes Behavioral Health   Address Phone       [place], [place] [address] [phone])          Coverage Information (for Hospital Account [id number])    1. MEDICARE/MEDICARE PART A AND B    F/O Payor/Plan Precert #   MEDICARE/MEDICARE PART A AND B    Subscriber Subscriber #   Garlen, Reinig [id number]   Address Phone   [address] [place], [place] [address]        2. CARD[employer]AL [employer]OVATIONS/CARDINAL [employer]ATIONS MEDICAID    F/O Payor/Plan Precert #   CARD[employer]AL [employer]NOVATIONS/CARDINAL [employer]OVATIONS MEDICAID    Subscriber Subscriber #   Joon, Pohle [id number]   Address Phone   [address] [place], [place] [address] [phone]          Legal Guardian:  Legal Guardian: (self)  Primary Care Provider:  Margaretann Loveless, MD  Current Outpatient Providers:  Margaretann Loveless, MD  Psychiatrist:  Name of Psychiatrist: [facility]  Counselor/Therapist:  Name of Therapist: None  Compliant with Medications:  No  Additional Information:   Tasia Catchings 5/13/20219:21 PM
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
## 2020-03-04 NOTE — Progress Notes (Signed)
Patient ID: Cory Floyd, male   DOB: 1976-03-10, 44 y.o.   MRN: [id number] Pt A&O x 4, awake & resting at present, no distress noted, calm & cooperative, pt remains SI, HI and AVH, monitoring for safety.

## 2020-03-05 DIAGNOSIS — F319 Bipolar disorder, unspecified: Secondary | ICD-10-CM | POA: Diagnosis not present

## 2020-03-05 MED ORDER — ARIPIPRAZOLE 10 MG PO TABS
10.0000 mg | ORAL_TABLET | Freq: Every day | ORAL | Status: DC
  Administered 2020-03-05 – 2020-03-07 (×3): 10 mg via ORAL
  Filled 2020-03-05 (×3): qty 1

## 2020-03-05 MED ORDER — DIVALPROEX SODIUM 500 MG PO DR TAB
500.0000 mg | DELAYED_RELEASE_TABLET | Freq: Every day | ORAL | Status: DC
  Administered 2020-03-05 – 2020-03-07 (×3): 500 mg via ORAL
  Filled 2020-03-05 (×3): qty 1

## 2020-03-05 NOTE — Progress Notes (Signed)
Patient has been in bed awake. Cooperative and calm upon approach. Alert and oriented. Currently denying suicidal thoughts. Denying hallucinations. Reports that he was feeling suicidal a few hours ago "they come and go". Patient reported that he was hungry and received an extra tray. Has not expressed any additional concern so far. Support and encouragements provided. Safety monitored as expected.

## 2020-03-05 NOTE — Progress Notes (Signed)
Pt laying on bed asleep. Slept majority of shift. Denies needs at current. Awaiting placement.

## 2020-03-05 NOTE — Progress Notes (Signed)
  COVID-19 Daily Checkoff  Have you had a fever (temp > 37.80C/100F)  in the past 24 hours?  No  If you have had runny nose, nasal congestion, sneezing in the past 24 hours, has it worsened? No  COVID-19 EXPOSURE  Have you traveled outside the state in the past 14 days? No  Have you been in contact with someone with a confirmed diagnosis of COVID-19 or PUI in the past 14 days without wearing appropriate PPE? No  Have you been living in the same home as a person with confirmed diagnosis of COVID-19 or a PUI (household contact)? No  Have you been diagnosed with COVID-19? No               D:  Patient  Laying in bed asleep upon MD and RN  entering room. Easily aroused. Cooperative but easily agitated during interview. Patient reports, "I feel like I'm being followed by people at this trap house I use to buy drugs from. I went to the police because I thought people were trying to set me up with a crime I didn't commit. So I became suicidal because I'm tired of feeling like this. The suicidal thoughts started when my mom died in 02-13-17. I have a plan but I'm not saying what it is. I feel homicidal toward someone but I'm not telling you because then it becomes a crime. Denies using drugs currently. Denies any physical complaints when asked. Denies A/VH.   A: Support and encouragement provided. Routine safety checks conducted every 15 minutes per unit protocol. Encouraged patient  to notify staff if thoughts of harm toward self or others arise. Patient verbalize agreement.   R: Patient remains safe at this time, patient verbally contracts for safety. Will continue to monitor.

## 2020-03-05 NOTE — Progress Notes (Signed)
Patient ID: Cory Floyd, male   DOB: 1975/12/25, 44 y.o.   MRN: [id number] 03/05/2020 at 7:30 AM  Observation unit progress note  44 year old male, presented to ED on 5/11 via [facility].  Reported depression, suicidal ideations with thoughts of shooting himself.  Stated he felt there was no point in living anymore.  He made statements regarding having a burial plot next to his mother's but stating " do not think I am not going to take someone with me" he also reported he had attempted to shoot himself a few weeks ago but that the round had misfired.  Also described paranoid ideations, stating that he was being tracked via his cell phone so that he threw it away and that there were people following him. Patient has a psychiatric history, has been hospitalized at [facility] in December 2020.  Currently he is not taking psychiatric medications  Today patient presents fairly related with little eye contact.  Endorses suicidal ideations and states "I am tired about this b.s." states he has a suicidal plan but currently refuses to elaborate.  He also acknowledges homicidal ideations but states" I am not going to talk about that either". He states he has been followed and reports he has been staying" at a squat house", where people were using drugs, and that now he has been followed.  He does not elaborate further. He does report struggling with depression since 02/28/2017 when his mother passed away. Orts past history of alcohol and drug abuse but states "I have not been using lately".  Admission UDS is positive for cocaine, amphetamines.  BAL is negative. Chart notes indicate several recent ED visits.  He was hospitalized at [facility] in December 2020, at which time he presented for suicidal ideations, hallucinations.  At that time he was diagnosed with bipolar disorder, was discharged on Abilify, Depakote, Wellbutrin XL, Neurontin, trazodone  Labs reviewed  MSE-alert, attentive, fairly related, poor eye contact, irritable,  describes mood as depressed.  Affect irritable.  Thought process presents linear.  Endorses suicidal ideations which she currently declines to elaborate on.  On admission reported thoughts of shooting self.  Also endorses homicidal ideations although also refused to elaborate.  Currently denies hallucinations and does not appear internally preoccupied.  Expresses ideations that he is being followed and monitored.   Diagnosis-substance-induced mood disorder/psychotic disorder versus bipolar disorder-consider mixed.  Stimulant use disorder.  Assessment and Plan-as per above assessment patient warrants inpatient psychiatric admission at this time for safety and stabilization.  Have reviewed with treatment team/staff For now we will resume Depakote at 500 mg daily initially and Abilify at 10 mg daily initially.  As noted he was prescribed these medications during his prior psychiatric admission.

## 2020-03-06 DIAGNOSIS — F3132 Bipolar disorder, current episode depressed, moderate: Secondary | ICD-10-CM

## 2020-03-06 DIAGNOSIS — F319 Bipolar disorder, unspecified: Secondary | ICD-10-CM | POA: Diagnosis not present

## 2020-03-06 NOTE — Progress Notes (Signed)
   03/06/20 0810  Psych Admission Type (Psych Patients Only)  Admission Status Voluntary  Psychosocial Assessment  Patient Complaints Depression;Worrying  Eye Contact Brief  Facial Expression Flat  Affect Flat;Sullen  Speech Logical/coherent  Interaction Minimal  Motor Activity Fidgety  Appearance/Hygiene In hospital gown  Behavior Characteristics Cooperative  Mood Depressed;Sullen  Thought Process  Coherency WDL  Content WDL  Delusions None reported or observed  Perception WDL  Hallucination None reported or observed  Judgment Poor  Confusion None  Danger to Self  Current suicidal ideation? Denies  Self-Injurious Behavior No self-injurious ideation or behavior indicators observed or expressed   Agreement Not to Harm Self No  Danger to Others  Danger to Others None reported or observed  Danger to Others Abnormal  Harmful Behavior to others No threats or harm toward other people

## 2020-03-06 NOTE — Progress Notes (Signed)
Patient meets criteria for inpatient treatment. No appropriate or available beds at [facility]. CSW faxed referrals to the following facilities for review:  [facility]   [facility]   [facility]   [facility]   [facility]   [facility]   [facility]   [facility]   [facility]    TTS will continue to seek bed placement.  Vilma Meckel. Algis Greenhouse, MSW, LCSW Clinical Social Work/Disposition Phone: [phone] Fax: [phone]

## 2020-03-06 NOTE — Progress Notes (Addendum)
[facility] MD Progress Note  03/06/2020 6:31 PM Cory Floyd  MRN:  [id number] Subjective:  "I'm better, not ready yet."  Principal Problem: Bipolar affective disorder, depressed, moderate ([place]) Diagnosis: Principal Problem:   Bipolar affective disorder, depressed, moderate ([place])  Total Time spent with patient: 45 minutes  Patient seen and evaluated in person by this provider and psychiatric team.  Client reports feeling down and depressed related to social issues of being homeless and legal issues.  He originally presented in the ED on 5/7 and transferred later to the observation unit, today is day 8.  Discussed the need for a discharge plan as his stay has been longer than the normal inpatient unit and he does report feeling better today.  He reports he does not feel safe returning to [place] and cannot go to the [facility] rescue mission related to past legal issues.  His provider is at [facility].  Denies any suicidal/homicidal ideations, hallucinations, and withdrawal symptoms.  No adverse symptoms from medications.  Arranging meeting with social worker for her social issues with the discharge plan of tomorrow.  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   Anginal pain ([place])    Arthritis    Bipolar 1 disorder ([place])    COVID-19    Depressed    Dyspnea    Fatty liver    Schizophrenia ([place])     Past Surgical History:  Procedure Laterality Date   COLONOSCOPY  02/05/2014   diverticulosis, hyperplastic polyp x 2   fracture arm Right    KNEE SURGERY Right 2013   NASAL SEPTUM SURGERY     NASAL TURBINATE REDUCTION Bilateral 12/12/2016   Procedure: TURBINATE REDUCTION/SUBMUCOSAL RESECTION;  Surgeon: Beverly Gust, MD;  Location: [facility] ORS;  Service: ENT;  Laterality: Bilateral;   Family History:  Family History  Problem Relation Age of Onset   Hepatitis C Mother     Cancer Mother    Cancer Maternal Aunt    Hypertension Maternal Grandmother    Family Psychiatric  History: none Social History:  Social History   Substance and Sexual Activity  Alcohol Use Yes   Comment: 1/2-5th pint of liquor     Social History   Substance and Sexual Activity  Drug Use Yes   Types: Cocaine, Marijuana   Comment: heroin    Social History   Socioeconomic History   Marital status: Divorced    Spouse name: Not on file   Number of children: Not on file   Years of education: Not on file   Highest education level: Not on file  Occupational History   Not on file  Tobacco Use   Smoking status: Current Every Day Smoker    Packs/day: 1.00    Years: 20.00    Pack years: 20.00    Types: Cigarettes   Smokeless tobacco: Never Used  Substance and Sexual Activity   Alcohol use: Yes    Comment: 1/2-5th pint of liquor   Drug use: Yes    Types: Cocaine, Marijuana    Comment: heroin   Sexual activity: Yes    Birth control/protection: None  Other Topics Concern   Not on file  Social History Narrative   Not on file   Social Determinants of Health   Financial Resource Strain:    Difficulty of Paying Living Expenses:   Food Insecurity:    Worried  About Running Out of Food in the Last Year:    Ran Out of Food in the Last Year:   Transportation Needs:    Freight forwarder (Medical):    Lack of Transportation (Non-Medical):   Physical Activity:    Days of Exercise per Week:    Minutes of Exercise per Session:   Stress:    Feeling of Stress :   Social Connections:    Frequency of Communication with Friends and Family:    Frequency of Social Gatherings with Friends and Family:    Attends Religious Services:    Active Member of Clubs or Organizations:    Attends Banker Meetings:    Marital Status:    Additional Social History:    Pain Medications: see MAR Prescriptions: see MAR Over the Counter: see MAR History of alcohol / drug  use?: Yes Longest period of sobriety (when/how long): Unknown 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, Legal, Personal relationships, Work / School                    Sleep: Good  Appetite:  Good  Current Medications: Current Facility-Administered Medications  Medication Dose Route Frequency Provider Last Rate Last Admin   ARIPiprazole (ABILIFY) tablet 10 mg  10 mg Oral Daily Cobos, Rockey Situ, MD   10 mg at 03/06/20 0829   divalproex (DEPAKOTE) DR tablet 500 mg  500 mg Oral Daily Cobos, Rockey Situ, MD   500 mg at 03/06/20 6073    Lab Results:  Results for orders placed or performed during the hospital encounter of 03/04/20 (from the past 48 hour(s))  SARS Coronavirus 2 by RT PCR (hospital order, performed in [facility] hospital lab) Nasopharyngeal Nasopharyngeal Swab     Status: None   Collection Time: 03/04/20  9:41 PM   Specimen: Nasopharyngeal Swab  Result Value Ref Range   SARS Coronavirus 2 NEGATIVE NEGATIVE    Comment: (NOTE) SARS-CoV-2 target nucleic acids are NOT DETECTED. The SARS-CoV-2 RNA is generally detectable in upper and lower respiratory specimens during the acute phase of infection. The lowest concentration of SARS-CoV-2 viral copies this assay can detect is 250 copies / mL. A negative result does not preclude SARS-CoV-2 infection and should not be used as the sole basis for treatment or other patient management decisions.  A negative result may occur with improper specimen collection / handling, submission of specimen other than nasopharyngeal swab, presence of viral mutation(s) within the areas targeted by this assay, and inadequate number of viral copies (<250 copies / mL). A negative result must be combined with clinical observations, patient history, and epidemiological information. Fact Sheet for Patients:   BoilerBrush.com.cy Fact Sheet for Healthcare Providers: https://pope.com/ This test is not  yet approved or cleared  by the [place] FDA and has been authorized for detection and/or diagnosis of SARS-CoV-2 by FDA under an Emergency Use Authorization (EUA).  This EUA will remain in effect (meaning this test can be used) for the duration of the COVID-19 declaration under Section 564(b)(1) of the Act, 21 U.S.C. section 360bbb-3(b)(1), unless the authorization is terminated or revoked sooner. Performed at [facility], [address] [place], [place] [address]     Blood Alcohol level:  Lab Results  Component Value Date   [facility] <10 03/02/2020   ETH <10 02/28/2020    Metabolic Disorder Labs: Lab Results  Component Value Date   HGBA1C 5.8 (H) 10/26/2019   MPG 119.76 10/26/2019   No results  found for: PROLACTIN No results found for: CHOL, TRIG, HDL, CHOLHDL, VLDL, LDLCALC  Physical Findings: AIMS: Facial and Oral Movements Muscles of Facial Expression: None, normal Lips and Perioral Area: None, normal Jaw: None, normal Tongue: None, normal,Extremity Movements Upper (arms, wrists, hands, fingers): None, normal Lower (legs, knees, ankles, toes): None, normal, Trunk Movements Neck, shoulders, hips: None, normal, Overall Severity Severity of abnormal movements (highest score from questions above): None, normal Incapacitation due to abnormal movements: None, normal Patient's awareness of abnormal movements (rate only patient's report): No Awareness, Dental Status Current problems with teeth and/or dentures?: No Does patient usually wear dentures?: No  CIWA:  CIWA-Ar Total: 1 COWS:  COWS Total Score: 2  Musculoskeletal: Strength & Muscle Tone: within normal limits Gait & Station: normal Patient leans: N/A  Psychiatric Specialty Exam: Physical Exam  Nursing note and vitals reviewed. Constitutional: He is oriented to person, place, and time. He appears well-developed and well-nourished.  HENT:  Head: Normocephalic.  Respiratory: Effort normal.   Musculoskeletal:        General: Normal range of motion.     Cervical back: Normal range of motion.  Neurological: He is oriented to person, place, and time.  Psychiatric: His speech is normal and behavior is normal. Judgment and thought content normal. His mood appears anxious. Cognition and memory are normal. He exhibits a depressed mood.    Review of Systems  Psychiatric/Behavioral: Positive for dysphoric mood. The patient is nervous/anxious.   All other systems reviewed and are negative.   Blood pressure 137/81, pulse 91, temperature 97.8 F (36.6 C), temperature source Oral, resp. rate 16, height 5\' 10"  (1.778 m), weight 88.5 kg, SpO2 100 %.Body mass index is 27.99 kg/m.  General Appearance: Casual  Eye Contact:  Good  Speech:  Clear and Coherent  Volume:  Normal  Mood:  Anxious and Depressed, irritable  Affect:  Congruent  Thought Process:  Coherent and Descriptions of Associations: Intact  Orientation:  Full (Time, Place, and Person)  Thought Content:  WDL and Logical  Suicidal Thoughts:  No  Homicidal Thoughts:  No  Memory:  Immediate;   Fair Recent;   Fair Remote;   Fair  Judgement:  Fair  Insight:  Fair  Psychomotor Activity:  Normal  Concentration:  Concentration: Fair and Attention Span: Fair  Recall:  of Knowledge:  Fair  Language:  Good  Akathisia:  No  Handed:  Right  AIMS (if indicated):     Assets:  Leisure Time Physical Health Resilience  ADL's:  Intact  Cognition:  WNL  Sleep:        Treatment Plan Summary: Daily contact with patient to assess and evaluate symptoms and progress in treatment, Medication management and Plan bipolar affective disorder, depressed, moderate:   -Abilify 10 mg daily continued -Depakote 500 mg daily  [employer], NP 03/06/2020, 6:31 PM Patient seen face-to-face for psychiatric evaluation, chart reviewed and case discussed with the physician extender and developed treatment plan. Reviewed the information  documented and agree with the treatment plan. 03/08/2020, MD

## 2020-03-07 DIAGNOSIS — F319 Bipolar disorder, unspecified: Secondary | ICD-10-CM | POA: Diagnosis not present

## 2020-03-07 DIAGNOSIS — F3132 Bipolar disorder, current episode depressed, moderate: Secondary | ICD-10-CM | POA: Diagnosis not present

## 2020-03-07 MED ORDER — DIVALPROEX SODIUM 500 MG PO DR TAB: 500.0000 mg | DELAYED_RELEASE_TABLET | Freq: Every day | ORAL | 0 refills | Status: DC

## 2020-03-07 NOTE — Progress Notes (Signed)
   03/07/20 0900  Psych Admission Type (Psych Patients Only)  Admission Status Voluntary  Psychosocial Assessment  Patient Complaints Worrying  Eye Contact Fair  Facial Expression Flat  Affect Flat  Speech Logical/coherent  Interaction Minimal  Motor Activity Other (Comment) (WNL)  Appearance/Hygiene In hospital gown  Behavior Characteristics Cooperative  Mood Depressed  Thought Process  Coherency WDL  Content WDL  Delusions None reported or observed  Perception WDL  Hallucination None reported or observed  Judgment Poor  Confusion None  Danger to Self  Current suicidal ideation? Denies  Self-Injurious Behavior No self-injurious ideation or behavior indicators observed or expressed   Agreement Not to Harm Self Yes  Description of Agreement Verbally contracts for safety  Danger to Others  Danger to Others None reported or observed  Danger to Others Abnormal  Harmful Behavior to others No threats or harm toward other people  Destructive Behavior No threats or harm toward property

## 2020-03-07 NOTE — [facility] Suicide Risk Assessment (Cosign Needed Addendum)
Suicide Risk Assessment    [facility] Discharge Suicide Risk Assessment   Principal Problem: Bipolar affective disorder, depressed, moderate (HCC) Discharge Diagnoses: Principal Problem:   Bipolar affective disorder, depressed, moderate (HCC)  Patient seen and evaluated in person by this provider and psychiatric team.  Denies any suicidal/homicidal ideations, hallucinations, and withdrawal symptoms.  He is prepared for discharge and has no concerns at this time.  His plan is to follow-up with [facility] where he is already established as a patient for medication management and therapy.  Dr. Jannifer Franklin also evaluated this client and concurs with the treatment plan.  Total Time spent with patient: 30 minutes  Musculoskeletal: Strength & Muscle Tone: within normal limits Gait & Station: normal Patient leans: N/A  Psychiatric Specialty Exam:   Blood pressure 137/81, pulse 91, temperature 97.8 F (36.6 C), temperature source Oral, resp. rate 16, height 5\' 10"  (1.778 m), weight 88.5 kg, SpO2 100 %.Body mass index is 27.99 kg/m.  General Appearance: Casual  Eye Contact::  Good  Speech:  Normal Rate409  Volume:  Normal  Mood:  Anxious and depressed, mild  Affect:  Congruent  Thought Process:  Coherent and Descriptions of Associations: Intact  Orientation:  Full (Time, Place, and Person)  Thought Content:  WDL and Logical  Suicidal Thoughts:  No  Homicidal Thoughts:  No  Memory:  Immediate;   Good Recent;   Good Remote;   Good  Judgement:  Fair  Insight:  Fair  Psychomotor Activity:  Normal  Concentration:  Good  Recall:  Good  Fund of Knowledge:Good  Language: Good  Akathisia:  No  Handed:  Right  AIMS (if indicated):     Assets:  Leisure Time Physical Health Resilience Social Support  Sleep:     Cognition: WNL  ADL's:  Intact   Mental Status Per Nursing Assessment::   On Admission:  Thoughts of violence towards others, Suicidal ideation indicated by  patient  Demographic Factors:  Male and Caucasian  Loss Factors: Legal issues  Historical Factors: NA  Risk Reduction Factors:   Sense of responsibility to family, Positive social support and Positive therapeutic relationship  Continued Clinical Symptoms:  Depression and anxiety, mild  Cognitive Features That Contribute To Risk:  None    Suicide Risk:  Minimal: No identifiable suicidal ideation.  Patients presenting with no risk factors but with morbid ruminations; may be classified as minimal risk based on the severity of the depressive symptoms   Plan Of Care/Follow-up recommendations:  Plan bipolar affective disorder, depressed, moderate:  -Abilify 10 mg daily continued -Depakote 500 mg daily -Follow-up with [facility] for medication management and therapy -Utilize resources provided by social work for social issues. Activity:  as tolerated Diet:  heart healthy diet  [phone], NP 03/07/2020, 3:34 PM

## 2020-03-07 NOTE — Progress Notes (Signed)
Pt did not endorse SI/HI/AVH to this nurse this shift. Pt slept throughout night with no complaints. Respirations even and unlabored during hours of sleep. Safety maintained.

## 2020-03-07 NOTE — Progress Notes (Signed)
Patient ID: Cory Floyd, male   DOB: 05-30-1976, 44 y.o.   MRN: [id number]  D: Pt alert and oriented on the unit.   A: Education, support, and encouragement provided. Discharge summary, medications and follow up appointments reviewed with pt. Suicide prevention resources provided. Pt's belongings in locker returned.  R: Pt denies SI/HI, A/VH, pain, or any concerns at this time. Pt ambulatory on and off unit. Pt discharged to lobby.

## 2020-03-07 NOTE — Discharge Instructions (Signed)
Supporting Someone With Bipolar Disorder Bipolar disorder is a mental health disorder that causes major shifts in mood, thinking, behavior, and energy. When a person has bipolar disorder, his or her condition can affect others around him or her, such as friends and family members. Friends and family can help by offering support and understanding. What do I need to know about this condition? The main feature of bipolar disorder is "mood episodes," which involve a marked change from the way that a person normally feels and acts. There are episodes of emotional highs (mania), when the person is in a state of extreme joy or excitement. There are also episodes of emotional lows (depression), when the person feels very sad or hopeless. Symptoms and episodes vary. Major manic episodes A manic period lasts for 1 week or longer, and it may involve:  Periods of having very high energy that may last longer than a week. In some cases, the person may have so much energy that he or she may become unsafe and need to go to the hospital.  Very high self-esteem or self-confidence.  Decreased need for sleep.  Being unusually talkative, or feeling a need to keep talking. Speech may be very fast. It may seem like the person cannot stop talking.  Racing thoughts or constant talking, with quick shifts between topics that may or may not be related (flight of ideas).  Decreased ability to focus or concentrate.  Increased purposeful activity, such as work, study, or social activity.  Increased nonproductive activity. This could be pacing, squirming and fidgeting, or finger and toe tapping.  Impulsive behavior and poor judgment. This may result in high-risk activities, such as having unprotected sex or spending a lot of money.  Having false beliefs (delusions) or seeing, hearing, or feeling things that do not exist (hallucinations). Major depressive episodes A depressive period may last for more than 2 weeks, and it  may involve:  Feeling sad, hopeless, or helpless.  Frequent or uncontrollable crying.  Lack of feeling or caring about anything.  Sleeping too much.  Moving more slowly than usual.  Not being able to enjoy things that used to be enjoyable.  Desire to be alone all the time.  Feeling guilty or worthless.  Lack of energy or motivation.  Trouble concentrating or remembering.  Trouble making decisions.  Increased appetite.  Thoughts of death, or desire to harm oneself. What do I need to know about the treatment options?  Diagnosis and treatment of bipolar disorder are complex. Bipolar disorder is a long-term (chronic) condition that must be treated on an ongoing (continuous) basis, rather than only when symptoms are present. Your loved one should work with a [employer] who specializes in diagnosing and treating mental disorders (psychiatrist). Treatment is often based on a person's symptoms, his or her response to treatment, and what he or she needs and prefers. In general, treatment uses medicine as well as talk therapies, such as one-on-one counseling, family therapy, and support groups. How can I support my loved one? Talk about the condition Good communication is the key to supporting your friend or family member. Here are a few things to keep in mind:  Be careful about too much prodding. Try not to overdo reminders to an adult friend or family member about things like taking medicines. Ask how your loved one prefers that you help.  Be encouraging. This can help to lower stress. Even saying something simple to comfort your loved one may help.  Never ignore  comments about suicide, and do not try to avoid the subject of suicide. Talking about suicide will not make your loved one want to act on it. You or your loved one can reach out 24 hours a day to get free, private support (on the phone or a live online chat) from a suicide crisis helpline, such as the National  Suicide Prevention Lifeline at (573)761-8588.  Ask a counselor or your loved one's health care provider about when to get help if you are concerned about behavior changes. Privacy laws limit how much a health care provider can share with you without your loved one's permission, but if you feel that a situation is an emergency, do not wait to call a health care provider or emergency services.  Listening is very important. Be available if your friend or family member wants to talk, but give your loved one space if he or she does not feel like talking. Make an effort to acknowledge his or her feelings. Find support and resources A health care provider may be able to recommend mental health resources that are available online or over the phone. You could start with:  Government sites such as the Substance Abuse and Mental Health Services Administration (SAMHSA): SkateOasis.com.pt  National mental health organizations such as the [employer] on Mental Illness (NAMI): www.nami.org Think about joining self-help and support groups, not only for your friend or family member, but also for yourself. People in these peer and family support groups understand what you and your loved one are going through. They can help you feel a sense of hope and connect you with local resources to help you learn more. General support  Make an effort to learn all you can about bipolar disorder.  Help your loved one follow his or her treatment plan as directed by health care providers. This could mean driving him or her to therapy sessions or suggesting ways to cope with stress, such as going for a walk together.  Help with daily responsibilities. Laundry or help with meals help may be appreciated.  Try not to push too hard after a major episode. It takes time to recover afterward. Give your friend or family member that time.  Remember that your support really matters. Social support is a huge benefit for someone who is  coping with bipolar disorder. How can I create a safe environment?  Make a written crisis plan. Include important phone numbers, such as the local crisis hotline. Make sure that: ? The person with bipolar disorder knows about this plan. ? Everyone who has regular contact with the person knows about the plan and knows what to do in an emergency.  To lower the risk of violence or suicide during a crisis, remove or lock up guns and other weapons. If you do not have a safe place to keep a gun, local law enforcement may store a gun for you. How should I care for myself? Supporting someone with bipolar disorder can cause stress. It is important to find ways to care for your body, mind, and well-being.  Find someone you can talk to who will also help you work on using coping skills to manage stress.  Try to maintain your normal routines. This can help you remember that your life is about more than your loved one's condition.  Understand what your limits are. Say "no" to requests or events that lead to a schedule that is too busy.  Make time for activities that help you relax,  and try to not feel guilty about taking time for yourself.  Consider trying meditation and deep breathing exercises.  Get plenty of sleep.  Exercise, even if it is just taking a short walk a few times a week. What are some signs that the condition is getting worse? Bipolar disorder usually lasts a lifetime. It is typical for mood episodes to come back over time. Things that can make episodes worse include:  Not taking prescribed medicines as directed. The person may forget to take the medicines or may believe that the medicines are not needed.  Stress.  Poor sleep.  Drug or alcohol use. One of the most common signs that the condition may be getting worse is a clear, sudden change in behavior. The crisis may happen quickly when it is brought on by events such as a natural disaster or losing a job. However, changes in  behavior are most often gradual. If you feel like something is wrong, do not wait to see if the person will get better. Call a health care provider for help. It is always best to do something early, to prevent an episode from becoming an emergency. Get help right away if:  You are in a situation that threatens your life. Leave the situation and call emergency services (911 in the [place] as soon as possible. If you ever feel like your loved one may hurt himself or herself or others, or may have thoughts about taking his or her own life, get help right away. You can go to your nearest emergency department or call:  Your local emergency services (911 in the [place]  A suicide crisis helpline, such as the [place] at 430-229-5071. This is open 24 hours a day. Summary  As a friend or family member of someone with bipolar disorder, you may have an important part to play in treatment and recovery. At times, it may be overwhelming and stressful.  Learn about the symptoms of bipolar disorder, learn about treatment, and get support for yourself as a caregiver.  Find ways to care for your own body, mind, and well-being while supporting someone with bipolar disorder. This information is not intended to replace advice given to you by your health care provider. Make sure you discuss any questions you have with your health care provider. Document Revised: 01/30/2019 Document Reviewed: 02/20/2017 Elsevier Patient Education  [place].

## 2020-05-15 ENCOUNTER — Emergency Department (HOSPITAL_COMMUNITY): Payer: Medicare Other

## 2020-05-15 ENCOUNTER — Other Ambulatory Visit: Payer: Self-pay

## 2020-05-15 ENCOUNTER — Encounter (HOSPITAL_COMMUNITY): Payer: Self-pay | Admitting: Emergency Medicine

## 2020-05-15 ENCOUNTER — Emergency Department (HOSPITAL_COMMUNITY)
Admission: EM | Admit: 2020-05-15 | Discharge: 2020-05-16 | Disposition: A | Discharge status: Home / Self Care | Payer: Medicare Other | Attending: Emergency Medicine | Admitting: Emergency Medicine

## 2020-05-15 DIAGNOSIS — M79631 Pain in right forearm: Secondary | ICD-10-CM | POA: Insufficient documentation

## 2020-05-15 DIAGNOSIS — R0789 Other chest pain: Secondary | ICD-10-CM | POA: Diagnosis not present

## 2020-05-15 DIAGNOSIS — F17210 Nicotine dependence, cigarettes, uncomplicated: Secondary | ICD-10-CM | POA: Insufficient documentation

## 2020-05-15 DIAGNOSIS — M79641 Pain in right hand: Secondary | ICD-10-CM | POA: Diagnosis not present

## 2020-05-15 DIAGNOSIS — R0781 Pleurodynia: Secondary | ICD-10-CM

## 2020-05-15 LAB — BASIC METABOLIC PANEL
Anion gap: 9 (ref 5–15)
BUN: 24 mg/dL — ABNORMAL HIGH (ref 6–20)
CO2: 24 mmol/L (ref 22–32)
Calcium: 9.5 mg/dL (ref 8.9–10.3)
Chloride: 107 mmol/L (ref 98–111)
Creatinine, Ser: 1.31 mg/dL — ABNORMAL HIGH (ref 0.61–1.24)
GFR calc Af Amer: >60 mL/min (ref >60)
GFR calc non Af Amer: >60 mL/min (ref >60)
Glucose, Bld: 99 mg/dL (ref 70–99)
Potassium: 4.2 mmol/L (ref 3.5–5.1)
Sodium: 140 mmol/L (ref 135–145)

## 2020-05-15 LAB — CBC
HCT: 39.7 % (ref 39.0–52.0)
Hemoglobin: 12.8 g/dL — ABNORMAL LOW (ref 13.0–17.0)
MCH: 30.9 pg (ref 26.0–34.0)
MCHC: 32.2 g/dL (ref 30.0–36.0)
MCV: 95.9 fL (ref 80.0–100.0)
Platelets: 213 10*3/uL (ref 150–400)
RBC: 4.14 MIL/uL — ABNORMAL LOW (ref 4.22–5.81)
RDW: 13.3 % (ref 11.5–15.5)
WBC: 6.2 10*3/uL (ref 4.0–10.5)
nRBC: 0 % (ref 0.0–0.2)

## 2020-05-15 LAB — TROPONIN I (HIGH SENSITIVITY): Troponin I (High Sensitivity): 7 ng/L (ref <18)

## 2020-05-15 MED ORDER — SODIUM CHLORIDE 0.9% FLUSH: 3.0000 mL | Freq: Once | INTRAVENOUS | Status: DC

## 2020-05-15 NOTE — ED Triage Notes (Signed)
Pt presents to ED POV. Pt c/o R CP that radiates to back for several months. Pt reports worsening with breathing. Pain is a 7/10 and sharp

## 2020-05-16 ENCOUNTER — Emergency Department (HOSPITAL_COMMUNITY): Payer: Medicare Other

## 2020-05-16 DIAGNOSIS — R0789 Other chest pain: Secondary | ICD-10-CM | POA: Diagnosis not present

## 2020-05-16 LAB — TROPONIN I (HIGH SENSITIVITY): Troponin I (High Sensitivity): 5 ng/L (ref <18)

## 2020-05-16 LAB — D-DIMER, QUANTITATIVE: D-Dimer, Quant: <0.27 ug/mL-FEU (ref 0.00–0.50)

## 2020-05-16 MED ORDER — NAPROXEN 500 MG PO TABS
500.0000 mg | ORAL_TABLET | Freq: Two times a day (BID) | ORAL | 0 refills | Status: DC
Start: 2020-05-16 — End: 2020-09-27

## 2020-05-16 NOTE — Discharge Instructions (Signed)
Please read and follow all provided instructions.  Your diagnoses today include:  1. Pleuritic chest pain     Tests performed today include:  An EKG of your heart  A chest x-ray  Cardiac enzymes - a blood test for heart muscle damage  Blood counts and electrolytes  D-dimer - screening test for blood clot which was negative  X-ray of the hand - no broken bones  Vital signs. See below for your results today.   Medications prescribed:   Naproxen - anti-inflammatory pain medication  Do not exceed 500mg  naproxen every 12 hours, take with food  You have been prescribed an anti-inflammatory medication or NSAID. Take with food. Take smallest effective dose for the shortest duration needed for your pain. Stop taking if you experience stomach pain or vomiting.   Take any prescribed medications only as directed.  Follow-up instructions: Please follow-up with your primary care provider as soon as you can for further evaluation of your symptoms.   Return instructions:  SEEK IMMEDIATE MEDICAL ATTENTION IF:  You have severe chest pain, especially if the pain is crushing or pressure-like and spreads to the arms, back, neck, or jaw, or if you have sweating, nausea (feeling sick to your stomach), or shortness of breath. THIS IS AN EMERGENCY. Don't wait to see if the pain will go away. Get medical help at once. Call 911 or 0 (operator). DO NOT drive yourself to the hospital.   Your chest pain gets worse and does not go away with rest.   You have an attack of chest pain lasting longer than usual, despite rest and treatment with the medications your caregiver has prescribed.   You wake from sleep with chest pain or shortness of breath.  You feel dizzy or faint.  You have chest pain not typical of your usual pain for which you originally saw your caregiver.   You have any other emergent concerns regarding your health.  Additional Information: Chest pain comes from many different  causes. Your caregiver has diagnosed you as having chest pain that is not specific for one problem, but does not require admission.  You are at low risk for an acute heart condition or other serious illness.   Your vital signs today were: BP 109/69   Pulse 65   Temp 99.1 F (37.3 C) (Oral)   Resp 18   Ht 5\' 10"  (1.778 m)   Wt 90.7 kg   SpO2 98%   BMI 28.70 kg/m  If your blood pressure (BP) was elevated above 135/85 this visit, please have this repeated by your doctor within one month. --------------

## 2020-05-16 NOTE — ED Notes (Signed)
Patient asleep, bag lunch given
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
## 2020-06-20 ENCOUNTER — Emergency Department (HOSPITAL_COMMUNITY): Payer: Medicare Other

## 2020-06-20 ENCOUNTER — Encounter (HOSPITAL_COMMUNITY): Payer: Self-pay | Admitting: Emergency Medicine

## 2020-06-20 ENCOUNTER — Other Ambulatory Visit: Payer: Self-pay

## 2020-06-20 ENCOUNTER — Emergency Department (HOSPITAL_COMMUNITY)
Admission: EM | Admit: 2020-06-20 | Discharge: 2020-06-21 | Disposition: A | Discharge status: Home / Self Care | Payer: Medicare Other | Attending: Emergency Medicine | Admitting: Emergency Medicine

## 2020-06-20 DIAGNOSIS — Y30XXXA Falling, jumping or pushed from a high place, undetermined intent, initial encounter: Secondary | ICD-10-CM | POA: Insufficient documentation

## 2020-06-20 DIAGNOSIS — F17210 Nicotine dependence, cigarettes, uncomplicated: Secondary | ICD-10-CM | POA: Diagnosis not present

## 2020-06-20 DIAGNOSIS — Y998 Other external cause status: Secondary | ICD-10-CM | POA: Diagnosis not present

## 2020-06-20 DIAGNOSIS — M79601 Pain in right arm: Secondary | ICD-10-CM | POA: Insufficient documentation

## 2020-06-20 DIAGNOSIS — Y9389 Activity, other specified: Secondary | ICD-10-CM | POA: Insufficient documentation

## 2020-06-20 DIAGNOSIS — F1290 Cannabis use, unspecified, uncomplicated: Secondary | ICD-10-CM | POA: Diagnosis not present

## 2020-06-20 DIAGNOSIS — Y9289 Other specified places as the place of occurrence of the external cause: Secondary | ICD-10-CM | POA: Diagnosis not present

## 2020-06-20 DIAGNOSIS — W19XXXA Unspecified fall, initial encounter: Secondary | ICD-10-CM

## 2020-06-20 NOTE — ED Triage Notes (Signed)
Pt to ED with c/o right shoulder pain onset earlier today after falling on it

## 2020-06-20 NOTE — ED Notes (Signed)
Pt called for triage, no response. 

## 2020-06-21 ENCOUNTER — Emergency Department (HOSPITAL_COMMUNITY): Payer: Medicare Other

## 2020-06-21 DIAGNOSIS — M79601 Pain in right arm: Secondary | ICD-10-CM | POA: Diagnosis not present

## 2020-06-21 MED ORDER — NAPROXEN 250 MG PO TABS
500.0000 mg | ORAL_TABLET | Freq: Once | ORAL | Status: AC
  Administered 2020-06-21: 500 mg via ORAL
  Filled 2020-06-21: qty 2

## 2020-06-21 MED ORDER — ACETAMINOPHEN 500 MG PO TABS
1000.0000 mg | ORAL_TABLET | Freq: Once | ORAL | Status: AC
  Administered 2020-06-21: 1000 mg via ORAL
  Filled 2020-06-21: qty 2

## 2020-06-21 NOTE — Discharge Instructions (Signed)
You were seen in the ED for right arm pain  X-rays are normal  I suspect you have a soft tissue injury or contusion  Expect pain for the next 48 to 72 hours but after over-the-counter anti-inflammatories, ice and rest this should improve  Ice every 4-6 hours.  Rest with shoulder sling for the next 48 to 72 hours, after this start early range of motion exercises to avoid stiff neck  Use over-the-counter ibuprofen and acetaminophen for pain  For pain and inflammation you can use a combination of ibuprofen and acetaminophen.  Take [phone] mg acetaminophen (tylenol) every 6 hours or 600 mg ibuprofen (advil, motrin) every 6 hours.  You can take these separately or combine them every 6 hours for maximum pain control. Do not exceed 4,000 mg acetaminophen or 2,400 mg ibuprofen in a 24 hour period.  Do not take ibuprofen containing products if you have history of kidney disease, ulcers, GI bleeding, severe acid reflux, or take a blood thinner.  Do not take acetaminophen if you have liver disease.

## 2020-06-21 NOTE — ED Provider Notes (Signed)
[facility] EMERGENCY DEPARTMENT Provider Note   CSN: [id number] Arrival date & time: 06/20/20  2022     History Chief Complaint  Patient presents with  . Shoulder Pain    Cory Floyd is a 44 y.o. male presents to the ED for evaluation of fall that occurred at 6 PM yesterday.  Patient states he wanted to show off to his friends and he jumped over a 4 foot fence, he had plan to roll on the ground but he ended up just falling and hitting directly his right arm on the ground.  States the pain has significantly worsened since and he is concerned he might have fractured his arm.  Pain is worse with abduction and flexion of the arm.  Has associated bruising over the arm.  Denies any other injuries like head injury, loss of consciousness.  No distal arm tingling or loss of sensation.  HPI     Past Medical History:  Diagnosis Date  . 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 Anginal pain (HCC)   . Arthritis   . Bipolar 1 disorder (HCC)   . COVID-19   . Depressed   . Dyspnea   . Fatty liver   . Schizophrenia [facility])     Patient Active Problem List   Diagnosis Date Noted  . Bipolar affective disorder, depressed, moderate (HCC) 03/06/2020  . Arthritis 09/30/2019  . Gastroesophageal reflux disease without esophagitis 09/21/2016  . Obstructive sleep apnea 09/21/2016  . Morbid obesity with BMI of 40.0-44.9, adult (HCC) 08/15/2016  . Stable angina pectoris (HCC) 08/15/2016    Past Surgical History:  Procedure Laterality Date  . COLONOSCOPY  02/05/2014   diverticulosis, hyperplastic polyp x 2  . fracture arm Right   . KNEE SURGERY Right 2013  . NASAL SEPTUM SURGERY    . NASAL TURBINATE REDUCTION Bilateral 12/12/2016   Procedure: TURBINATE REDUCTION/SUBMUCOSAL RESECTION;  Surgeon: Linus Salmons, MD;  Location: [facility] ORS;  Service: ENT;  Laterality: Bilateral;       Family History  Problem Relation Age of Onset  . Hepatitis C Mother   . Cancer Mother   .  Cancer Maternal Aunt   . Hypertension Maternal Grandmother     Social History   Tobacco Use  . Smoking status: Current Every Day Smoker    Packs/day: 1.00    Years: 20.00    Pack years: 20.00    Types: Cigarettes  . Smokeless tobacco: Never Used  Vaping Use  . Vaping Use: Never used  Substance Use Topics  . Alcohol use: Yes    Comment: 1/2-5th pint of liquor  . Drug use: Yes    Types: Cocaine, Marijuana    Comment: heroin    Home Medications Prior to Admission medications   Medication Sig Start Date End Date Taking? Authorizing Provider  naproxen (NAPROSYN) 500 MG tablet Take 1 tablet (500 mg total) by mouth 2 (two) times daily. 05/16/20   Renne Crigler, [name]  ARIPiprazole (ABILIFY) 30 MG tablet Take 1 tablet (30 mg total) by mouth daily. Patient not taking: Reported on 03/02/2020 10/28/19 05/16/20  Money, Gerlene Burdock, FNP  divalproex (DEPAKOTE) 500 MG DR tablet Take 1 tablet (500 mg total) by mouth daily. Patient not taking: Reported on 05/16/2020 03/08/20 05/16/20  Charm Rings, NP    Allergies    Carrot [daucus carota] and Penicillins  Review of Systems   Review of Systems  Musculoskeletal: Positive for arthralgias.  Skin: Positive for  color change.  All other systems reviewed and are negative.   Physical Exam Updated Vital Signs BP (!) 113/57 (BP Location: Left Arm)   Pulse 85   Temp 98.1 F (36.7 C) (Oral)   Resp 15   Ht 5\' 10"  (1.778 m)   Wt 95.3 kg   SpO2 100%   BMI 30.13 kg/m   Physical Exam Vitals and nursing note reviewed.  Constitutional:      General: He is not in acute distress.    Appearance: He is well-developed.     Comments: NAD.  HENT:     Head: Normocephalic and atraumatic.     Right Ear: External ear normal.     Left Ear: External ear normal.     Nose: Nose normal.  Eyes:     General: No scleral icterus.    Conjunctiva/sclera: Conjunctivae normal.  Cardiovascular:     Rate and Rhythm: Normal rate and regular rhythm.     Heart  sounds: Normal heart sounds. No murmur heard.      Comments: 1+ radial pulse in the right arm Pulmonary:     Effort: Pulmonary effort is normal.     Breath sounds: Normal breath sounds. No wheezing.  Musculoskeletal:        General: Tenderness present. No deformity. Normal range of motion.     Cervical back: Normal range of motion and neck supple.     Comments: Isolated right mid lateral humerus tenderness with developing ecchymosis and small abrasion.  Compartments are soft.  No other focal bony tenderness over the right shoulder, right elbow.  Full range of motion of the wrist, elbow with no pain.  Decreased range of motion at the shoulder due to pain in the humerus.  Skin:    General: Skin is warm and dry.     Capillary Refill: Capillary refill takes less than 2 seconds.  Neurological:     Mental Status: He is alert and oriented to person, place, and time.     Comments: Sensation in the median, ulnar and radial distal duration intact in the right hand.  Strength in the right hand intact.  Psychiatric:        Behavior: Behavior normal.        Thought Content: Thought content normal.        Judgment: Judgment normal.     ED Results / Procedures / Treatments   Labs (all labs ordered are listed, but only abnormal results are displayed) Labs Reviewed - No data to display  EKG None  Radiology DG Shoulder Right  Result Date: 06/20/2020 CLINICAL DATA:  Status post trauma. EXAM: RIGHT SHOULDER - 2+ VIEW COMPARISON:  None. FINDINGS: There is no evidence of fracture or dislocation. There is no evidence of arthropathy or other focal bone abnormality. Soft tissues are unremarkable. IMPRESSION: Negative. Electronically Signed   By: 06/22/2020 M.D.   On: 06/20/2020 22:51   DG Humerus Right  Result Date: 06/21/2020 CLINICAL DATA:  Fall, mid right humerus pain and bruising, initial encounter. EXAM: RIGHT HUMERUS - 2+ VIEW COMPARISON:  None. FINDINGS: No acute osseous abnormality.  IMPRESSION: Negative. Electronically Signed   By: 06/23/2020 M.D.   On: 06/21/2020 08:33    Procedures Procedures (including critical care time)  Medications Ordered in ED Medications  acetaminophen (TYLENOL) tablet 1,000 mg (has no administration in time range)  naproxen (NAPROSYN) tablet 500 mg (has no administration in time range)    ED Course  I have reviewed the  triage vital signs and the nursing notes.  Pertinent labs & imaging results that were available during my care of the patient were reviewed by me and considered in my medical decision making (see chart for details).    MDM Rules/Calculators/A&P                          44 year old male presents to the ED for evaluation of right-sided arm pain after mechanical fall.  Exam reveals developing contusion and focal tenderness but otherwise benign, normal neuro and vascular status.  No other proximal or distal joint involvement.  X-rays obtained in triage of right shoulder personally visualized and interpreted, I have added right humerus fracture as well for better visualization over the area of pain.  Humerus x-ray is nonacute.  Will discharge with shoulder sling, NSAIDs, ice, early range of motion exercises.  PCP follow-up for persistent pain, return for worsening symptoms.  He is comfortable with this plan. Suspect soft tissue injury.  Final Clinical Impression(s) / ED Diagnoses Final diagnoses:  Right arm pain  Fall, initial encounter    Rx / DC Orders ED Discharge Orders    None       Liberty Handy, [name] 06/21/20 0859    Melene Plan, DO 06/21/20 1749

## 2020-07-24 ENCOUNTER — Emergency Department (HOSPITAL_COMMUNITY)
Admission: EM | Admit: 2020-07-24 | Discharge: 2020-07-26 | Disposition: A | Discharge status: Home / Self Care | Payer: Medicare Other | Attending: Emergency Medicine | Admitting: Emergency Medicine

## 2020-07-24 ENCOUNTER — Other Ambulatory Visit: Payer: Self-pay

## 2020-07-24 ENCOUNTER — Encounter (HOSPITAL_COMMUNITY): Payer: Self-pay | Admitting: Emergency Medicine

## 2020-07-24 DIAGNOSIS — F1494 Cocaine use, unspecified with cocaine-induced mood disorder: Secondary | ICD-10-CM | POA: Insufficient documentation

## 2020-07-24 DIAGNOSIS — Z8616 Personal history of COVID-19: Secondary | ICD-10-CM | POA: Insufficient documentation

## 2020-07-24 DIAGNOSIS — F22 Delusional disorders: Secondary | ICD-10-CM | POA: Insufficient documentation

## 2020-07-24 DIAGNOSIS — R000 Tachycardia, unspecified: Secondary | ICD-10-CM | POA: Diagnosis not present

## 2020-07-24 DIAGNOSIS — F1920 Other psychoactive substance dependence, uncomplicated: Secondary | ICD-10-CM

## 2020-07-24 DIAGNOSIS — F3132 Bipolar disorder, current episode depressed, moderate: Secondary | ICD-10-CM | POA: Diagnosis present

## 2020-07-24 DIAGNOSIS — R451 Restlessness and agitation: Secondary | ICD-10-CM | POA: Insufficient documentation

## 2020-07-24 DIAGNOSIS — R45851 Suicidal ideations: Secondary | ICD-10-CM | POA: Diagnosis not present

## 2020-07-24 DIAGNOSIS — F1994 Other psychoactive substance use, unspecified with psychoactive substance-induced mood disorder: Secondary | ICD-10-CM | POA: Diagnosis present

## 2020-07-24 DIAGNOSIS — Z20822 Contact with and (suspected) exposure to covid-19: Secondary | ICD-10-CM | POA: Diagnosis not present

## 2020-07-24 DIAGNOSIS — F17210 Nicotine dependence, cigarettes, uncomplicated: Secondary | ICD-10-CM | POA: Diagnosis not present

## 2020-07-24 NOTE — ED Triage Notes (Addendum)
Pt is paranoid. He tried to walk through the doors without checking in and checking doors. He seems to be responding to internal stimuli. He is redirectable. Security heard him stating multiple times that he wants to kill himself and wants to go to the psych unit,

## 2020-07-24 NOTE — ED Notes (Signed)
Labs not attempted at this time due to pt's paranoid state.

## 2020-07-25 ENCOUNTER — Encounter (HOSPITAL_COMMUNITY): Payer: Self-pay | Admitting: Student

## 2020-07-25 DIAGNOSIS — F22 Delusional disorders: Secondary | ICD-10-CM | POA: Diagnosis not present

## 2020-07-25 DIAGNOSIS — F1920 Other psychoactive substance dependence, uncomplicated: Secondary | ICD-10-CM

## 2020-07-25 LAB — COMPREHENSIVE METABOLIC PANEL
ALT: 27 U/L (ref 0–44)
AST: 30 U/L (ref 15–41)
Albumin: 4.7 g/dL (ref 3.5–5.0)
Alkaline Phosphatase: 69 U/L (ref 38–126)
Anion gap: 13 (ref 5–15)
BUN: 14 mg/dL (ref 6–20)
CO2: 25 mmol/L (ref 22–32)
Calcium: 9.7 mg/dL (ref 8.9–10.3)
Chloride: 100 mmol/L (ref 98–111)
Creatinine, Ser: 1.07 mg/dL (ref 0.61–1.24)
GFR calc Af Amer: >60 mL/min (ref >60)
GFR calc non Af Amer: >60 mL/min (ref >60)
Glucose, Bld: 163 mg/dL — ABNORMAL HIGH (ref 70–99)
Potassium: 3.8 mmol/L (ref 3.5–5.1)
Sodium: 138 mmol/L (ref 135–145)
Total Bilirubin: 1.2 mg/dL (ref 0.3–1.2)
Total Protein: 8.1 g/dL (ref 6.5–8.1)

## 2020-07-25 LAB — CBC
HCT: 40.7 % (ref 39.0–52.0)
Hemoglobin: 13.7 g/dL (ref 13.0–17.0)
MCH: 30.8 pg (ref 26.0–34.0)
MCHC: 33.7 g/dL (ref 30.0–36.0)
MCV: 91.5 fL (ref 80.0–100.0)
Platelets: 226 10*3/uL (ref 150–400)
RBC: 4.45 MIL/uL (ref 4.22–5.81)
RDW: 13.9 % (ref 11.5–15.5)
WBC: 8.5 10*3/uL (ref 4.0–10.5)
nRBC: 0 % (ref 0.0–0.2)

## 2020-07-25 LAB — RAPID URINE DRUG SCREEN, HOSP PERFORMED
Amphetamines: NOT DETECTED
Barbiturates: NOT DETECTED
Benzodiazepines: NOT DETECTED
Cocaine: POSITIVE — AB
Opiates: NOT DETECTED
Tetrahydrocannabinol: POSITIVE — AB

## 2020-07-25 LAB — RESPIRATORY PANEL BY RT PCR (FLU A&B, COVID)
Influenza A by PCR: NEGATIVE
Influenza B by PCR: NEGATIVE
SARS Coronavirus 2 by RT PCR: NEGATIVE

## 2020-07-25 LAB — SALICYLATE LEVEL: Salicylate Lvl: <7 mg/dL — ABNORMAL LOW (ref 7.0–30.0)

## 2020-07-25 LAB — ETHANOL: Alcohol, Ethyl (B): <10 mg/dL (ref <10)

## 2020-07-25 LAB — ACETAMINOPHEN LEVEL: Acetaminophen (Tylenol), Serum: <10 ug/mL — ABNORMAL LOW (ref 10–30)

## 2020-07-25 MED ORDER — LORAZEPAM 1 MG PO TABS: 0.0000 mg | ORAL_TABLET | Freq: Two times a day (BID) | ORAL | Status: DC

## 2020-07-25 MED ORDER — ZIPRASIDONE MESYLATE 20 MG IM SOLR
20.0000 mg | Freq: Once | INTRAMUSCULAR | Status: AC
  Administered 2020-07-25: 20 mg via INTRAMUSCULAR
  Filled 2020-07-25: qty 20

## 2020-07-25 MED ORDER — LORAZEPAM 2 MG/ML IJ SOLN: 0.0000 mg | Freq: Four times a day (QID) | INTRAMUSCULAR | Status: DC

## 2020-07-25 MED ORDER — ZOLPIDEM TARTRATE 5 MG PO TABS: 5.0000 mg | ORAL_TABLET | Freq: Every evening | ORAL | Status: DC | PRN

## 2020-07-25 MED ORDER — NICOTINE 21 MG/24HR TD PT24
21.0000 mg | MEDICATED_PATCH | Freq: Every day | TRANSDERMAL | Status: DC
  Filled 2020-07-25: qty 1

## 2020-07-25 MED ORDER — THIAMINE HCL 100 MG PO TABS
100.0000 mg | ORAL_TABLET | Freq: Every day | ORAL | Status: DC
  Administered 2020-07-25 – 2020-07-26 (×2): 100 mg via ORAL
  Filled 2020-07-25 (×2): qty 1

## 2020-07-25 MED ORDER — GABAPENTIN 300 MG PO CAPS
300.0000 mg | ORAL_CAPSULE | Freq: Three times a day (TID) | ORAL | Status: DC
  Administered 2020-07-25 – 2020-07-26 (×4): 300 mg via ORAL
  Filled 2020-07-25 (×4): qty 1

## 2020-07-25 MED ORDER — LORAZEPAM 2 MG/ML IJ SOLN: 0.0000 mg | Freq: Two times a day (BID) | INTRAMUSCULAR | Status: DC

## 2020-07-25 MED ORDER — STERILE WATER FOR INJECTION IJ SOLN
INTRAMUSCULAR | Status: AC
  Filled 2020-07-25: qty 10

## 2020-07-25 MED ORDER — ACETAMINOPHEN 325 MG PO TABS
650.0000 mg | ORAL_TABLET | ORAL | Status: DC | PRN
  Administered 2020-07-25: 650 mg via ORAL
  Filled 2020-07-25: qty 2

## 2020-07-25 MED ORDER — THIAMINE HCL 100 MG/ML IJ SOLN: 100.0000 mg | Freq: Every day | INTRAMUSCULAR | Status: DC

## 2020-07-25 MED ORDER — LORAZEPAM 1 MG PO TABS
0.0000 mg | ORAL_TABLET | Freq: Four times a day (QID) | ORAL | Status: DC
  Administered 2020-07-25: 1 mg via ORAL
  Filled 2020-07-25: qty 1

## 2020-07-25 MED ORDER — ALUM & MAG HYDROXIDE-SIMETH 200-200-20 MG/5ML PO SUSP: 30.0000 mL | Freq: Four times a day (QID) | ORAL | Status: DC | PRN

## 2020-07-25 NOTE — ED Provider Notes (Signed)
[facility]-EMERGENCY DEPT Provider Note   CSN: [id number] Arrival date & time: 07/24/20  2333     History Chief Complaint  Patient presents with  . Paranoid    Cory Floyd is a 44 y.o. male with a history of schizophrenia, bipolar 1 disorder, alcoholism, and OSA who presents to the emergency department with reports of suicidal ideation as well as hallucinations for several weeks.  Patient states he is suicidal, he denies a specific plan to me, he also reports he is hearing voices.  No alleviating or aggravating factors to his symptoms.  He admits to alcohol consumption today, he estimates he drinks three fourths of 1/5 of liquor.  He states that he killed someone.  He at times states he wants to go to the psych ward, other times he states he wants to go to jail.  He denies chest pain, abdominal pain, shortness of breath, or syncope.  Denies drug use today.  HPI     Past Medical History:  Diagnosis Date  . 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 Anginal pain (HCC)   . Arthritis   . Bipolar 1 disorder (HCC)   . COVID-19   . Depressed   . Dyspnea   . Fatty liver   . Schizophrenia [facility])     Patient Active Problem List   Diagnosis Date Noted  . Bipolar affective disorder, depressed, moderate (HCC) 03/06/2020  . Arthritis 09/30/2019  . Gastroesophageal reflux disease without esophagitis 09/21/2016  . Obstructive sleep apnea 09/21/2016  . Morbid obesity with BMI of 40.0-44.9, adult (HCC) 08/15/2016  . Stable angina pectoris (HCC) 08/15/2016    Past Surgical History:  Procedure Laterality Date  . COLONOSCOPY  02/05/2014   diverticulosis, hyperplastic polyp x 2  . fracture arm Right   . KNEE SURGERY Right 2013  . NASAL SEPTUM SURGERY    . NASAL TURBINATE REDUCTION Bilateral 12/12/2016   Procedure: TURBINATE REDUCTION/SUBMUCOSAL RESECTION;  Surgeon: Linus Salmons, MD;  Location: [facility] ORS;  Service: ENT;  Laterality: Bilateral;       Family  History  Problem Relation Age of Onset  . Hepatitis C Mother   . Cancer Mother   . Cancer Maternal Aunt   . Hypertension Maternal Grandmother     Social History   Tobacco Use  . Smoking status: Current Every Day Smoker    Packs/day: 1.00    Years: 20.00    Pack years: 20.00    Types: Cigarettes  . Smokeless tobacco: Never Used  Vaping Use  . Vaping Use: Never used  Substance Use Topics  . Alcohol use: Yes    Comment: 1/2-5th pint of liquor  . Drug use: Yes    Types: Cocaine, Marijuana    Comment: heroin    Home Medications Prior to Admission medications   Medication Sig Start Date End Date Taking? Authorizing Provider  naproxen (NAPROSYN) 500 MG tablet Take 1 tablet (500 mg total) by mouth 2 (two) times daily. 05/16/20   Renne Crigler, [name]  ARIPiprazole (ABILIFY) 30 MG tablet Take 1 tablet (30 mg total) by mouth daily. Patient not taking: Reported on 03/02/2020 10/28/19 05/16/20  Money, Gerlene Burdock, FNP  divalproex (DEPAKOTE) 500 MG DR tablet Take 1 tablet (500 mg total) by mouth daily. Patient not taking: Reported on 05/16/2020 03/08/20 05/16/20  Charm Rings, NP    Allergies    Carrot [daucus carota] and Penicillins  Review of Systems   Review of Systems  Constitutional: Negative for chills and fever.  Respiratory: Negative for cough and shortness of breath.   Cardiovascular: Negative for chest pain.  Gastrointestinal: Negative for abdominal pain.  Neurological: Negative for seizures and syncope.  Psychiatric/Behavioral: Positive for hallucinations and suicidal ideas.  All other systems reviewed and are negative.   Physical Exam Updated Vital Signs BP 122/64 (BP Location: Right Arm)   Pulse (!) 130   Temp 98.2 F (36.8 C) (Oral)   Resp 20   Ht 5\' 10"  (1.778 m)   Wt 95.3 kg   SpO2 99%   BMI 30.13 kg/m   Physical Exam Vitals and nursing note reviewed.  Constitutional:      Appearance: He is well-developed. He is not toxic-appearing.  HENT:     Head:  Normocephalic and atraumatic.  Eyes:     General:        Right eye: No discharge.        Left eye: No discharge.     Conjunctiva/sclera: Conjunctivae normal.  Cardiovascular:     Rate and Rhythm: Regular rhythm. Tachycardia present.  Pulmonary:     Effort: Pulmonary effort is normal. No respiratory distress.     Breath sounds: Normal breath sounds. No wheezing, rhonchi or rales.  Abdominal:     General: There is no distension.     Palpations: Abdomen is soft.     Tenderness: There is no abdominal tenderness.  Musculoskeletal:     Cervical back: Neck supple.  Skin:    General: Skin is warm and dry.     Findings: No rash.  Neurological:     Mental Status: He is alert.     Comments: Clear speech.   Psychiatric:        Attention and Perception: He perceives auditory and visual hallucinations.        Speech: Speech is rapid and pressured.        Behavior: Behavior is agitated.        Thought Content: Thought content is paranoid. Thought content includes suicidal ideation.     Comments: Patient is pacing throughout the exam rooms.  He appears to be responding to internal stimuli.  Speech is rapid and pressured.  He admits to suicidal ideation.    ED Results / Procedures / Treatments   Labs (all labs ordered are listed, but only abnormal results are displayed) Labs Reviewed  COMPREHENSIVE METABOLIC PANEL - Abnormal; Notable for the following components:      Result Value   Glucose, Bld 163 (*)    All other components within normal limits  SALICYLATE LEVEL - Abnormal; Notable for the following components:   Salicylate Lvl <7.0 (*)    All other components within normal limits  ACETAMINOPHEN LEVEL - Abnormal; Notable for the following components:   Acetaminophen (Tylenol), Serum <10 (*)    All other components within normal limits  RAPID URINE DRUG SCREEN, HOSP PERFORMED - Abnormal; Notable for the following components:   Cocaine POSITIVE (*)    Tetrahydrocannabinol POSITIVE (*)     All other components within normal limits  RESPIRATORY PANEL BY RT PCR (FLU A&B, COVID)  ETHANOL  CBC    EKG None  Radiology No results found.  Procedures .Critical Care Performed by: , [name] Authorized by: Cherly Anderson, [name]    CRITICAL CARE Performed by: Cherly Anderson   Total critical care time: 35 minutes  Critical care time was exclusive of separately billable procedures and treating other patients.  Critical care  was necessary to treat or prevent imminent or life-threatening deterioration.  Critical care was time spent personally by me on the following activities: development of treatment plan with patient and/or surrogate as well as nursing, discussions with consultants, evaluation of patient's response to treatment, examination of patient, obtaining history from patient or surrogate, ordering and performing treatments and interventions, ordering and review of laboratory studies, ordering and review of radiographic studies, pulse oximetry and re-evaluation of patient's condition.   (including critical care time)  Medications Ordered in ED Medications  ziprasidone (GEODON) injection 20 mg (has no administration in time range)    ED Course  I have reviewed the triage vital signs and the nursing notes.  Pertinent labs & imaging results that were available during my care of the patient were reviewed by me and considered in my medical decision making (see chart for details).    MDM Rules/Calculators/A&P                         Patient presents to the emergency department with suicidal ideation and hallucinations.  He has a psychiatric history including bipolar and schizophrenia.  He is agitated, pacing through hallways, appears to be responding to internal stimuli, and is trying to leave the emergency department frequently.  Will place under IVC.  IM Geodon ordered.  His tachycardia may be related to his agitation, will  recheck.  Additional history obtained:  Additional history obtained from chart review & nursing note review. Previous records obtained and reviewed  Lab Tests:  I reviewed & interpreted labs, which included:  CBC, CMP, ethanol level, salicylate level, acetaminophen level, and UDS: Hyperglycemia without acidosis or anion gap elevation.  No current alcohol intoxication.  UDS positive for cocaine and tetrahydrocannabinol.  Repeat vitals with normalized HR, resting comfortably on reassessment.  Blood pressure 122/64, pulse 98, temperature 98.2 F (36.8 C), temperature source Oral, resp. rate 20, height 5\' 10"  (1.778 m), weight 95.3 kg, SpO2 99 %.  Patient medically cleared at this time.  Disposition per [facility].   The patient has been placed in psychiatric observation due to the need to provide a safe environment for the patient while obtaining psychiatric consultation and evaluation, as well as ongoing medical and medication management to treat the patient's condition.  The patient has been placed under full IVC at this time.  Portions of this note were generated with [facility]. Dictation errors may occur despite best attempts at proofreading.  Final Clinical Impression(s) / ED Diagnoses Final diagnoses:  Suicidal ideation    Rx / DC Orders ED Discharge Orders    None       Scientist, clinical (histocompatibility and immunogenetics), [name] 07/25/20 0515    09/24/20, MD 07/25/20 0630
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
## 2020-07-25 NOTE — ED Notes (Signed)
Pt moved from triage 5 to [place] [facility]. Belongings placed in cabinet on 23-25 nursing station side. IVC forms placed at nursing station.

## 2020-07-25 NOTE — ED Notes (Signed)
Patient given ice and water.

## 2020-07-25 NOTE — ED Notes (Signed)
TTS machine at bedside. 

## 2020-07-25 NOTE — ED Notes (Signed)
Assumed care of patient at this time, nad noted, sr up x2, bed locked and low, call bell w/I reach.  Will continue to monitor. ° °

## 2020-07-25 NOTE — BH Assessment (Signed)
Received TTS consult order. Per RN, Pt has been given medication and is currently unable to participate in assessment.   Pamalee Leyden, [facility], [facility] Triage Specialist [phone]

## 2020-07-25 NOTE — BH Assessment (Signed)
Per Ophelia Shoulder, NP, patient meets criteria for overnight observation. Pending am psych evaluation. (Per nursing staff, patient is under IVC. Therefore, he is not a candidate for the [facility]).

## 2020-07-26 ENCOUNTER — Encounter (HOSPITAL_COMMUNITY): Payer: Self-pay | Admitting: Registered Nurse

## 2020-07-26 DIAGNOSIS — F1994 Other psychoactive substance use, unspecified with psychoactive substance-induced mood disorder: Secondary | ICD-10-CM | POA: Diagnosis not present

## 2020-07-26 DIAGNOSIS — F22 Delusional disorders: Secondary | ICD-10-CM | POA: Diagnosis not present

## 2020-07-26 DIAGNOSIS — R45851 Suicidal ideations: Secondary | ICD-10-CM | POA: Diagnosis not present

## 2020-07-26 NOTE — Discharge Instructions (Signed)
To help you maintain a sober lifestyle, a substance abuse treatment program may be beneficial to you.  Contact one of the following facilities at your earliest opportunity to ask about enrolling:  RESIDENTIAL PROGRAMS:       [facility]      [address] [place], [place] [address]      [phone]       [facility]      [address]      [place], [place] [address]      [phone]  OUTPATIENT PROGRAMS:       [facility]      [address]      [place], [place] [address]      [phone]      Contact person: [facility], [place], LCAS      Ask about their Substance Abuse Intensive Outpatient Program.

## 2020-07-26 NOTE — ED Notes (Signed)
Patient arrived to unit somewhat irritable.  Upon report pt stated that he didn't need to wear a mask as he wanted Covid to "just take him out"   IVC papers arrived with patient,  Belongings locked in locker number 28.

## 2020-07-26 NOTE — BH Assessment (Signed)
[facility] Assessment Progress Note  Per Shuvon Rankin, NP, this pt does not require psychiatric hospitalization at this time.  Pt presents under IVC initiated by EDP Riley Lam Delo,MD which has been rescinded by Nelly Rout, MD.  Pt is psychiatrically cleared.  Discharge instructions include referral information for area substance abuse treatment providers.  Pt would also benefit from seeing Peer Support Specialists, and a peer support consult has been ordered for pt.  EDP Marianna Fuss, MD and pt's nurse, Kendal Hymen, have been notified.  Doylene Canning, MA Triage Specialist [phone]

## 2020-07-26 NOTE — Patient Outreach (Signed)
ED Peer Support Specialist Patient Intake (Complete at intake & 30-60 Day Follow-up)  Name: Cory Floyd  MRN: [id number]  Age: 44 y.o.   Date of Admission: 07/26/2020  Intake: Initial Comments:      Primary Reason Admitted: Substance Abuse concerns   Lab values: Alcohol/ETOH: Negative Positive UDS? Yes Amphetamines: No Barbiturates: No Benzodiazepines: No Cocaine: Yes Opiates: No Cannabinoids: Yes  Demographic information: Gender: Male Ethnicity: Other (comment) Marital Status: Divorced Insurance Status: Marketing executive (Work Neurosurgeon, Physicist, medical, etc.: Yes Lives with: Friend/Rommate Living situation: Transitional housing  Reported Patient History: Patient reported health conditions: Bipolar disorder, Schizoaffective disorder, Depression Patient aware of HIV and hepatitis status: No  In past year, has patient visited ED for any reason? Yes  Number of ED visits: 6  Reason(s) for visit: Various reasons  In past year, has patient been hospitalized for any reason? No  Number of hospitalizations:    Reason(s) for hospitalization:    In past year, has patient been arrested? No  Number of arrests:    Reason(s) for arrest:    In past year, has patient been incarcerated? No  Number of incarcerations:    Reason(s) for incarceration:    In past year, has patient received medication-assisted treatment? No  In past year, patient received the following treatments:    In past year, has patient received any harm reduction services? No  Did this include any of the following?    In past year, has patient received care from a mental health provider for diagnosis other than SUD? No  In past year, is this first time patient has overdosed? No  Number of past overdoses:    In past year, is this first time patient has been hospitalized for an overdose? No  Number of hospitalizations for overdose(s):    Is patient currently  receiving treatment for a mental health diagnosis? No  Patient reports experiencing difficulty participating in SUD treatment: No    Most important reason(s) for this difficulty?    Has patient received prior services for treatment? No  In past, patient has received services from following agencies:    Plan of Care:  Suggested follow up at these agencies/treatment centers: ACTT [facility] Treatment Team)  Other information: CPSS met with Pt an was able to complete series of questions. CPSS was able to process with Pt to find out what services Pt are seeking. CPSS was made aware that Pt wants assistance with housing an medication management. CPSS issued Pt information for ACTT services. CPSS contacted ACTT team an are going to get Pt set up with Intake specialist for services.   Aaron Edelman Betzaida Cremeens, CPSS  07/26/2020 12:30 PM

## 2020-07-26 NOTE — Consult Note (Signed)
Telepsych Consultation   Reason for Consult:  Suicidal ideation Referring Physician: Desmond LopePetrucelli, Samantha R, [name] [facility] Location of Provider: Other: [facility]  Patient Identification: Cory Floyd MRN:  [id number] Principal Diagnosis: Substance induced mood disorder (HCC) Diagnosis:  Principal Problem:   Substance induced mood disorder (HCC) Active Problems:   Bipolar affective disorder, depressed, moderate (HCC)   Polysubstance dependence (HCC)   Total Time spent with patient: 30 minutes  Subjective:   Cory Floyd is a 44 y.o. male patient [facility] after presenting with complaints of suicide and hallucinations for several weeks. [name].  HPI:  Cory Floyd, 44 y.o., male patient seen via tele psych by this provider, consulted with Dr. Lucianne MussKumar; and chart reviewed on 07/26/20.  On evaluation Cory Floyd reports he recently moved to [place] from [place].  Patient state he was brought to the hospital "cause I was having a episode.  I thought I was being follow ed.  I don't know it felt like I was being followed."  Patient states he was living in a transitional house prior to coming to [place] and [facility] of [facility] and would like to go there.  Patient states that he is not currently suicidal "Depends on my mood right now I'm good."  Patient also states that he would like to get set up with outpatient psychiatric services and get in touch with someone in the [facility].  States at one time he had ACT services but not for at least a year.  During evaluation Cory Floyd is alert/oriented x 4; calm/cooperative; and mood is congruent with affect.  He does not appear to be responding to internal/external stimuli or delusional thoughts.  Patient denies suicidal/self-harm/homicidal ideation, psychosis, and paranoia.  Patient answered question appropriately.  Discussed Peer support consult and assisting him with finding a half way house.  Patient  in agreement.      Past Psychiatric History: See above  Risk to Self: Suicidal Ideation: Yes-Currently Present Suicidal Intent: No Is patient at risk for suicide?: Yes Suicidal Plan?: No Access to Means: No What has been your use of drugs/alcohol within the last 12 months?: daily use cocaine and alcohol daily How many times?: 3 Other Self Harm Risks:  (hosing and ) Triggers for Past Attempts: None known Intentional Self Injurious Behavior: Cutting Comment - Self Injurious Behavior: states that he has not cut in a year Risk to Others: Homicidal Ideation: No Thoughts of Harm to Others: No Current Homicidal Intent: No Current Homicidal Plan: No Access to Homicidal Means: No Identified Victim: none History of harm to others?: No Assessment of Violence: None Noted Violent Behavior Description: n Does patient have access to weapons?: No Criminal Charges Pending?: No Does patient have a court date: No Prior Inpatient Therapy: Prior Inpatient Therapy: Yes Prior Therapy Dates: 02/2020 [facility] Reason for Treatment: depression Prior Outpatient Therapy: Prior Outpatient Therapy: No Does patient have an ACCT team?: No Does patient have Intensive In-House Services?  : No Does patient have [facility] services? : No Does patient have P4CC services?: No  Past Medical History:  Past Medical History:  Diagnosis Date  . 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 Anginal pain (HCC)   . Arthritis   . Bipolar 1 disorder (HCC)   . COVID-19   . Depressed   . Dyspnea   . Fatty liver   . Schizophrenia (HCC)  Past Surgical History:  Procedure Laterality Date  . COLONOSCOPY  02/05/2014   diverticulosis, hyperplastic polyp x 2  . fracture arm Right   . KNEE SURGERY Right 2013  . NASAL SEPTUM SURGERY    . NASAL TURBINATE REDUCTION Bilateral 12/12/2016   Procedure: TURBINATE REDUCTION/SUBMUCOSAL RESECTION;  Surgeon: Linus Salmons, MD;  Location: [facility] ORS;   Service: ENT;  Laterality: Bilateral;   Family History:  Family History  Problem Relation Age of Onset  . Hepatitis C Mother   . Cancer Mother   . Cancer Maternal Aunt   . Hypertension Maternal Grandmother    Family Psychiatric  History: Unaware Social History:  Social History   Substance and Sexual Activity  Alcohol Use Yes   Comment: 1/2-5th pint of liquor     Social History   Substance and Sexual Activity  Drug Use Yes  . Types: Cocaine, Marijuana   Comment: heroin    Social History   Socioeconomic History  . Marital status: Divorced    Spouse name: Not on file  . Number of children: Not on file  . Years of education: Not on file  . Highest education level: Not on file  Occupational History  . Not on file  Tobacco Use  . Smoking status: Current Every Day Smoker    Packs/day: 1.00    Years: 20.00    Pack years: 20.00    Types: Cigarettes  . Smokeless tobacco: Never Used  Vaping Use  . Vaping Use: Never used  Substance and Sexual Activity  . Alcohol use: Yes    Comment: 1/2-5th pint of liquor  . Drug use: Yes    Types: Cocaine, Marijuana    Comment: heroin  . Sexual activity: Yes    Birth control/protection: None  Other Topics Concern  . Not on file  Social History Narrative  . Not on file   Social Determinants of Health   Financial Resource Strain:   . Difficulty of Paying Living Expenses: Not on file  Food Insecurity:   . Worried About Programme researcher, broadcasting/film/video in the Last Year: Not on file  . Ran Out of Food in the Last Year: Not on file  Transportation Needs:   . Lack of Transportation (Medical): Not on file  . Lack of Transportation (Non-Medical): Not on file  Physical Activity:   . Days of Exercise per Week: Not on file  . Minutes of Exercise per Session: Not on file  Stress:   . Feeling of Stress : Not on file  Social Connections:   . Frequency of Communication with Friends and Family: Not on file  . Frequency of Social Gatherings with  Friends and Family: Not on file  . Attends Religious Services: Not on file  . Active Member of Clubs or Organizations: Not on file  . Attends Banker Meetings: Not on file  . Marital Status: Not on file   Additional Social History:    Allergies:   Allergies  Allergen Reactions  . Carrot [Daucus Carota] Anaphylaxis and Rash  . Penicillins Anaphylaxis and Hives    Has patient had a PCN reaction causing immediate rash, facial/tongue/throat swelling, SOB or lightheadedness with hypotension: Yes Has patient had a PCN reaction causing severe rash involving mucus membranes or skin necrosis: No Has patient had a PCN reaction that required hospitalization Yes Has patient had a PCN reaction occurring within the last 10 years: No If all of the above answers are "NO", then may proceed with Cephalosporin  use.     Labs:  Results for orders placed or performed during the hospital encounter of 07/24/20 (from the past 48 hour(s))  Comprehensive metabolic panel     Status: Abnormal   Collection Time: 07/25/20 12:25 AM  Result Value Ref Range   Sodium 138 135 - 145 mmol/L   Potassium 3.8 3.5 - 5.1 mmol/L   Chloride 100 98 - 111 mmol/L   CO2 25 22 - 32 mmol/L   Glucose, Bld 163 (H) 70 - 99 mg/dL    Comment: Glucose reference range applies only to samples taken after fasting for at least 8 hours.   BUN 14 6 - 20 mg/dL   Creatinine, Ser 1.30 0.61 - 1.24 mg/dL   Calcium 9.7 8.9 - 86.5 mg/dL   Total Protein 8.1 6.5 - 8.1 g/dL   Albumin 4.7 3.5 - 5.0 g/dL   AST 30 15 - 41 U/L   ALT 27 0 - 44 U/L   Alkaline Phosphatase 69 38 - 126 U/L   Total Bilirubin 1.2 0.3 - 1.2 mg/dL   GFR calc non Af Amer >60 >60 mL/min   GFR calc Af Amer >60 >60 mL/min   Anion gap 13 5 - 15    Comment: Performed at [facility], [address], [place], [place] [address]  Ethanol     Status: None   Collection Time: 07/25/20 12:25 AM  Result Value Ref Range   Alcohol, Ethyl (B) <10 <10  mg/dL    Comment: (NOTE) Lowest detectable limit for serum alcohol is 10 mg/dL.  For medical purposes only. Performed at [facility], [address] [place], [place] [address]   Salicylate level     Status: Abnormal   Collection Time: 07/25/20 12:25 AM  Result Value Ref Range   Salicylate Lvl <7.0 (L) 7.0 - 30.0 mg/dL    Comment: Performed at [facility], [address] [place], [place] [address]  Acetaminophen level     Status: Abnormal   Collection Time: 07/25/20 12:25 AM  Result Value Ref Range   Acetaminophen (Tylenol), Serum <10 (L) 10 - 30 ug/mL    Comment: (NOTE) Therapeutic concentrations vary significantly. A range of 10-30 ug/mL  may be an effective concentration for many patients. However, some  are best treated at concentrations outside of this range. Acetaminophen concentrations >150 ug/mL at 4 hours after ingestion  and >50 ug/mL at 12 hours after ingestion are often associated with  toxic reactions.  Performed at [facility], [address] [place], [place] [address]   cbc     Status: None   Collection Time: 07/25/20 12:25 AM  Result Value Ref Range   WBC 8.5 4.0 - 10.5 K/uL   RBC 4.45 4.22 - 5.81 MIL/uL   Hemoglobin 13.7 13.0 - 17.0 g/dL   HCT 27.2 39 - 52 %   MCV 91.5 80.0 - 100.0 fL   MCH 30.8 26.0 - 34.0 pg   MCHC 33.7 30.0 - 36.0 g/dL   RDW 53.6 64.4 - 03.4 %   Platelets 226 150 - 400 K/uL   nRBC 0.0 0.0 - 0.2 %    Comment: Performed at [facility], [name], [place], [place] [address]  Rapid urine drug screen (hospital performed)     Status: Abnormal   Collection Time: 07/25/20 12:25 AM  Result Value Ref Range   Opiates NONE DETECTED NONE DETECTED   Cocaine POSITIVE (A) NONE DETECTED  Benzodiazepines NONE DETECTED NONE DETECTED   Amphetamines NONE DETECTED NONE DETECTED   Tetrahydrocannabinol POSITIVE (A) NONE DETECTED   Barbiturates NONE DETECTED NONE  DETECTED    Comment: (NOTE) DRUG SCREEN FOR MEDICAL PURPOSES ONLY.  IF CONFIRMATION IS NEEDED FOR ANY PURPOSE, NOTIFY LAB WITHIN 5 DAYS.  LOWEST DETECTABLE LIMITS FOR URINE DRUG SCREEN Drug Class                     Cutoff (ng/mL) Amphetamine and metabolites    1000 Barbiturate and metabolites    200 Benzodiazepine                 200 Tricyclics and metabolites     300 Opiates and metabolites        300 Cocaine and metabolites        300 THC                            50 Performed at [facility], [address] [place], [place] [address]   Respiratory Panel by RT PCR (Flu A&B, Covid) - Nasopharyngeal Swab     Status: None   Collection Time: 07/25/20  1:55 AM   Specimen: Nasopharyngeal Swab  Result Value Ref Range   SARS Coronavirus 2 by RT PCR NEGATIVE NEGATIVE    Comment: (NOTE) SARS-CoV-2 target nucleic acids are NOT DETECTED.  The SARS-CoV-2 RNA is generally detectable in upper respiratoy specimens during the acute phase of infection. The lowest concentration of SARS-CoV-2 viral copies this assay can detect is 131 copies/mL. A negative result does not preclude SARS-Cov-2 infection and should not be used as the sole basis for treatment or other patient management decisions. A negative result may occur with  improper specimen collection/handling, submission of specimen other than nasopharyngeal swab, presence of viral mutation(s) within the areas targeted by this assay, and inadequate number of viral copies (<131 copies/mL). A negative result must be combined with clinical observations, patient history, and epidemiological information. The expected result is Negative.  Fact Sheet for Patients:  https://www.moore.com/  Fact Sheet for Healthcare Providers:  https://www.young.biz/  This test is no t yet approved or cleared by the [place] FDA and  has been authorized for detection and/or diagnosis of  SARS-CoV-2 by FDA under an Emergency Use Authorization (EUA). This EUA will remain  in effect (meaning this test can be used) for the duration of the COVID-19 declaration under Section 564(b)(1) of the Act, 21 U.S.C. section 360bbb-3(b)(1), unless the authorization is terminated or revoked sooner.     Influenza A by PCR NEGATIVE NEGATIVE   Influenza B by PCR NEGATIVE NEGATIVE    Comment: (NOTE) The Xpert Xpress SARS-CoV-2/FLU/RSV assay is intended as an aid in  the diagnosis of influenza from Nasopharyngeal swab specimens and  should not be used as a sole basis for treatment. Nasal washings and  aspirates are unacceptable for Xpert Xpress SARS-CoV-2/FLU/RSV  testing.  Fact Sheet for Patients: https://www.moore.com/  Fact Sheet for Healthcare Providers: https://www.young.biz/  This test is not yet approved or cleared by the [place] FDA and  has been authorized for detection and/or diagnosis of SARS-CoV-2 by  FDA under an Emergency Use Authorization (EUA). This EUA will remain  in effect (meaning this test can be used) for the duration of the  Covid-19 declaration under Section 564(b)(1) of the Act, 21  U.S.C. section 360bbb-3(b)(1), unless the authorization is  terminated or revoked. Performed  at [facility], [address], [place], [place] [address]     Medications:  Current Facility-Administered Medications  Medication Dose Route Frequency Provider Last Rate Last Admin  . acetaminophen (TYLENOL) tablet 650 mg  650 mg Oral Q4H PRN Petrucelli, Samantha R, [name]   650 mg at 07/25/20 1556  . alum & mag hydroxide-simeth (MAALOX/MYLANTA) 200-200-20 MG/5ML suspension 30 mL  30 mL Oral Q6H PRN Petrucelli, Samantha R, [name]      . gabapentin (NEURONTIN) capsule 300 mg  300 mg Oral TID Ophelia Shoulder E, NP   300 mg at 07/26/20 0107  . nicotine (NICODERM CQ - dosed in mg/24 hours) patch 21 mg  21 mg Transdermal Daily  Petrucelli, Samantha R, [name]      . thiamine tablet 100 mg  100 mg Oral Daily Petrucelli, Samantha R, [name]   100 mg at 07/25/20 1057   Or  . thiamine (B-1) injection 100 mg  100 mg Intravenous Daily Petrucelli, Samantha R, [name]      . zolpidem (AMBIEN) tablet 5 mg  5 mg Oral QHS PRN Petrucelli, Samantha R, [name]       Current Outpatient Medications  Medication Sig Dispense Refill  . naproxen (NAPROSYN) 500 MG tablet Take 1 tablet (500 mg total) by mouth 2 (two) times daily. 20 tablet 0    Musculoskeletal: Strength & Muscle Tone: within normal limits Gait & Station: normal Patient leans: N/A  Psychiatric Specialty Exam: Physical Exam Vitals and nursing note reviewed. Exam conducted with a chaperone present.  Constitutional:      Appearance: Normal appearance.  Pulmonary:     Effort: Pulmonary effort is normal.  Musculoskeletal:        General: Normal range of motion.     Cervical back: Normal range of motion.  Neurological:     Mental Status: He is alert.  Psychiatric:        Attention and Perception: Attention and perception normal. He does not perceive auditory or visual hallucinations.        Mood and Affect: Mood and affect normal.        Speech: Speech normal.        Behavior: Behavior normal. Behavior is cooperative.        Thought Content: Thought content normal. Thought content is not paranoid or delusional. Thought content does not include homicidal or suicidal ideation.        Cognition and Memory: Cognition and memory normal.        Judgment: Judgment is impulsive.     Review of Systems  Psychiatric/Behavioral: Negative for confusion, hallucinations, self-injury, sleep disturbance and suicidal ideas.       Patient reported paranoia yesterday when he was driving stating that he felt someone was following him and "I tried to tell the police when they stopped me"   All other systems reviewed and are negative.   Blood pressure 126/77, pulse 62, temperature 98.1 F  (36.7 C), temperature source Oral, resp. rate 15, height 5\' 10"  (1.778 m), weight 95.3 kg, SpO2 100 %.Body mass index is 30.13 kg/m.  General Appearance: Casual  Eye Contact:  Good  Speech:  Clear and Coherent and Normal Rate  Volume:  Normal  Mood:  "Good"  Affect:  Appropriate and Congruent  Thought Process:  Coherent, Goal Directed and Descriptions of Associations: Intact  Orientation:  Full (Time, Place, and Person)  Thought Content:  WDL  Suicidal Thoughts:  No  Homicidal Thoughts:  No  Memory:  Immediate;   Good Recent;   Good  Judgement:  Intact  Insight:  Present  Psychomotor Activity:  Normal  Concentration:  Concentration: Good and Attention Span: Good  Recall:  Good  Fund of Knowledge:  Fair  Language:  Good  Akathisia:  No  Handed:  Right  AIMS (if indicated):     Assets:  Communication Skills Desire for Improvement  ADL's:  Intact  Cognition:  WNL  Sleep:        Treatment Plan Summary: Plan Peer support consult and outpatient psychiatric services  Disposition: No evidence of imminent risk to self or others at present.   Patient does not meet criteria for psychiatric inpatient admission. Supportive therapy provided about ongoing stressors. Discussed crisis plan, support from social network, calling 911, coming to the Emergency Department, and calling Suicide Hotline.  This service was provided via telemedicine using a 2-way, interactive audio and video technology.  Names of all persons participating in this telemedicine service and their role in this encounter. Name: Assunta Found Role: NP  Name: Dr. Nelly Rout Role: Psychiatrist  Name: Weyman Croon Role: Patient  Name:  Role:     Assunta Found, NP 07/26/2020 10:21 AM

## 2020-07-26 NOTE — ED Provider Notes (Signed)
Emergency Medicine Observation Re-evaluation Note  Cory Floyd is a 44 y.o. male, seen on rounds today.  Pt initially presented to the ED for complaints of Paranoid, IVC, and Drug Problem Currently, the patient is awaiting discharge.  Physical Exam  BP 126/77 (BP Location: Left Arm)   Pulse 62   Temp 98.1 F (36.7 C) (Oral)   Resp 15   Ht 5\' 10"  (1.778 m)   Wt 95.3 kg   SpO2 100%   BMI 30.13 kg/m  Physical Exam General: well appearing in no distress Cardiac: warm and well perfused Lungs: even and unlabored Psych: calm and cooperative  ED Course / MDM  EKG:    I have reviewed the labs performed to date as well as medications administered while in observation.  Recent changes in the last 24 hours include psych recommends discharge, does not meet in patient criteria. Psych rescinded IVC.  Plan  Current plan is for discharge. Patient is no longer under full IVC at this time.   , MD 07/26/20 1135

## 2020-08-23 ENCOUNTER — Emergency Department (HOSPITAL_COMMUNITY)
Admission: EM | Admit: 2020-08-23 | Discharge: 2020-08-24 | Disposition: A | Discharge status: Home / Self Care | Payer: Medicare Other | Attending: Emergency Medicine | Admitting: Emergency Medicine

## 2020-08-23 ENCOUNTER — Emergency Department (HOSPITAL_COMMUNITY): Payer: Medicare Other

## 2020-08-23 ENCOUNTER — Encounter (HOSPITAL_COMMUNITY): Payer: Self-pay

## 2020-08-23 ENCOUNTER — Other Ambulatory Visit: Payer: Self-pay

## 2020-08-23 DIAGNOSIS — R059 Cough, unspecified: Secondary | ICD-10-CM | POA: Insufficient documentation

## 2020-08-23 DIAGNOSIS — F17210 Nicotine dependence, cigarettes, uncomplicated: Secondary | ICD-10-CM | POA: Insufficient documentation

## 2020-08-23 DIAGNOSIS — Z20822 Contact with and (suspected) exposure to covid-19: Secondary | ICD-10-CM | POA: Insufficient documentation

## 2020-08-23 DIAGNOSIS — R0981 Nasal congestion: Secondary | ICD-10-CM | POA: Diagnosis not present

## 2020-08-23 DIAGNOSIS — R6883 Chills (without fever): Secondary | ICD-10-CM | POA: Insufficient documentation

## 2020-08-23 DIAGNOSIS — Z8616 Personal history of COVID-19: Secondary | ICD-10-CM | POA: Diagnosis not present

## 2020-08-23 DIAGNOSIS — M791 Myalgia, unspecified site: Secondary | ICD-10-CM | POA: Insufficient documentation

## 2020-08-23 NOTE — ED Provider Notes (Signed)
[facility]   CSN: [id number] Arrival date & time: 08/23/20  2054     History Chief Complaint  Patient presents with  . Chills    Cory Floyd is a 44 y.o. male with a history of tobacco abuse, alcohol abuse, bipolar 1 disorder, schizophrenia, and GERD who presents to the emergency department with complaints of generally not feeling well over the past 1-2 days.  Patient reports subjective fevers, chills, body aches, nasal congestion, cough productive of green phlegm sputum, and dyspnea w/ coughing spells. He states he has had a couple post tussive emesis episodes.  No alleviating aggravating factors to his symptoms.  No intervention prior to arrival.  His boss was sick with similar symptoms and was recently admitted to the hospital with COVID-19.  He states he has had both of his Covid shots, he also had Covid in December of last year.  He denies chest pain, abdominal pain, syncope, leg pain/swelling, hemoptysis, recent surgery/trauma, recent long travel, hormone use, personal hx of cancer, or hx of DVT/PE.   HPI     Past Medical History:  Diagnosis Date  . 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 Anginal pain (HCC)   . Arthritis   . Bipolar 1 disorder (HCC)   . COVID-19   . Depressed   . Dyspnea   . Fatty liver   . Schizophrenia [facility])     Patient Active Problem List   Diagnosis Date Noted  . Substance induced mood disorder (HCC) 07/26/2020  . Suicidal ideation   . Polysubstance dependence (HCC) 07/25/2020  . Bipolar affective disorder, depressed, moderate (HCC) 03/06/2020  . Arthritis 09/30/2019  . Gastroesophageal reflux disease without esophagitis 09/21/2016  . Obstructive sleep apnea 09/21/2016  . Morbid obesity with BMI of 40.0-44.9, adult (HCC) 08/15/2016  . Stable angina pectoris (HCC) 08/15/2016    Past Surgical History:  Procedure Laterality Date  . COLONOSCOPY  02/05/2014   diverticulosis, hyperplastic polyp  x 2  . fracture arm Right   . KNEE SURGERY Right 2013  . NASAL SEPTUM SURGERY    . NASAL TURBINATE REDUCTION Bilateral 12/12/2016   Procedure: TURBINATE REDUCTION/SUBMUCOSAL RESECTION;  Surgeon: Linus Salmons, MD;  Location: [facility] ORS;  Service: ENT;  Laterality: Bilateral;       Family History  Problem Relation Age of Onset  . Hepatitis C Mother   . Cancer Mother   . Cancer Maternal Aunt   . Hypertension Maternal Grandmother     Social History   Tobacco Use  . Smoking status: Current Every Day Smoker    Packs/day: 1.00    Years: 20.00    Pack years: 20.00    Types: Cigarettes  . Smokeless tobacco: Never Used  Vaping Use  . Vaping Use: Never used  Substance Use Topics  . Alcohol use: Yes    Comment: 1/2-5th pint of liquor  . Drug use: Yes    Types: Cocaine, Marijuana    Comment: heroin    Home Medications Prior to Admission medications   Medication Sig Start Date End Date Taking? Authorizing Provider  naproxen (NAPROSYN) 500 MG tablet Take 1 tablet (500 mg total) by mouth 2 (two) times daily. 05/16/20   Renne Crigler, [name]  ARIPiprazole (ABILIFY) 30 MG tablet Take 1 tablet (30 mg total) by mouth daily. Patient not taking: Reported on 03/02/2020 10/28/19 05/16/20  Money, Gerlene Burdock, FNP  divalproex (DEPAKOTE) 500 MG DR tablet Take 1 tablet (500 mg  total) by mouth daily. Patient not taking: Reported on 05/16/2020 03/08/20 05/16/20  Charm RingsLord, Jamison Y, NP    Allergies    Carrot [daucus carota] and Penicillins  Review of Systems   Review of Systems  Constitutional: Positive for chills and fever.  HENT: Positive for congestion. Negative for ear pain.   Respiratory: Positive for cough and shortness of breath.   Cardiovascular: Negative for chest pain.  Gastrointestinal: Positive for vomiting (post tussive). Negative for abdominal pain and blood in stool.  Genitourinary: Negative for dysuria.  Musculoskeletal: Positive for myalgias.  Neurological: Negative for syncope.  All  other systems reviewed and are negative.  Physical Exam Updated Vital Signs BP 105/63   Pulse 98   Temp 99.5 F (37.5 C) (Oral)   Resp 18   SpO2 97%   Physical Exam Vitals and nursing note reviewed.  Constitutional:      General: He is not in acute distress.    Appearance: He is well-developed. He is not toxic-appearing.  HENT:     Head: Normocephalic and atraumatic.     Right Ear: Ear canal normal. Tympanic membrane is not perforated, erythematous, retracted or bulging.     Left Ear: Ear canal normal. Tympanic membrane is not perforated, erythematous, retracted or bulging.     Ears:     Comments: No mastoid erythema/swellng/tenderness.     Nose:     Right Sinus: No maxillary sinus tenderness or frontal sinus tenderness.     Left Sinus: No maxillary sinus tenderness or frontal sinus tenderness.     Mouth/Throat:     Pharynx: Oropharynx is clear. Uvula midline. No oropharyngeal exudate or posterior oropharyngeal erythema.     Comments: Posterior oropharynx is symmetric appearing. Patient tolerating own secretions without difficulty. No trismus. No drooling. No hot potato voice. No swelling beneath the tongue, submandibular compartment is soft.  Eyes:     General:        Right eye: No discharge.        Left eye: No discharge.     Conjunctiva/sclera: Conjunctivae normal.  Cardiovascular:     Rate and Rhythm: Normal rate and regular rhythm.  Pulmonary:     Effort: Pulmonary effort is normal. No respiratory distress.     Breath sounds: Rhonchi (mild scattered) present. No wheezing or rales.  Abdominal:     General: There is no distension.     Palpations: Abdomen is soft.     Tenderness: There is no abdominal tenderness. There is no guarding or rebound.  Musculoskeletal:     Cervical back: Neck supple. No rigidity.  Lymphadenopathy:     Cervical: No cervical adenopathy.  Skin:    General: Skin is warm and dry.     Findings: No rash.  Neurological:     Mental Status: He is  alert.  Psychiatric:        Behavior: Behavior normal.    ED Results / Procedures / Treatments   Labs (all labs ordered are listed, but only abnormal results are displayed) Labs Reviewed  RESPIRATORY PANEL BY RT PCR (FLU A&B, COVID)    EKG None  Radiology DG Chest 2 View  Result Date: 08/24/2020 CLINICAL DATA:  Cough, fever, arthralgia EXAM: CHEST - 2 VIEW COMPARISON:  05/15/2020 FINDINGS: Lungs are clear. No pneumothorax or pleural effusion. Cardiac size within normal limits. Pulmonary vascularity is normal. A lytic lesion has developed within the right humeral head, new since right shoulder radiograph of 06/20/2020. Given its relatively rapid development, this  may be posttraumatic or infectious in etiology. IMPRESSION: No active cardiopulmonary disease. Interval development of a lytic lesion within the right humeral head. Dedicated right shoulder series is recommended for further evaluation. Correlation with clinical examination would also be helpful. Neck pain Electronically Signed   By: Helyn Numbers MD   On: 08/24/2020 00:13   DG Shoulder Right  Result Date: 08/24/2020 CLINICAL DATA:  Fall, right shoulder pain EXAM: RIGHT SHOULDER - 2+ VIEW COMPARISON:  None. FINDINGS: There is no evidence of fracture or dislocation. There is no evidence of arthropathy or other focal bone abnormality. Soft tissues are unremarkable. IMPRESSION: Negative. Electronically Signed   By: Helyn Numbers MD   On: 08/24/2020 01:21    Procedures Procedures (including critical care time)  Medications Ordered in ED Medications - No data to display  ED Course  I have reviewed the triage vital signs and the nursing notes.  Pertinent labs & imaging results that were available during my care of the patient were reviewed by me and considered in my medical decision making (see chart for details).    Cory Floyd was evaluated in Emergency Department on 08/24/2020 for the symptoms described in the history of  present illness. He/she was evaluated in the context of the global COVID-19 pandemic, which necessitated consideration that the patient might be at risk for infection with the SARS-CoV-2 virus that causes COVID-19. Institutional protocols and algorithms that pertain to the evaluation of patients at risk for COVID-19 are in a state of rapid change based on information released by regulatory bodies including the CDC and federal and state organizations. These policies and algorithms were followed during the patient's care in the ED.  MDM Rules/Calculators/A&P                         Patient presents to the ED with complaints of chills, subjective fevers, & URI sxs.  Nontoxic, vitals WNL.   Additional history obtained:  Additional history obtained from chart review & nursing note review. Previous records obtained and reviewed.   Lab Tests:  I Ordered, reviewed, and interpreted labs, which included:  Covid and influenza testing: Negative  Imaging Studies ordered:  I ordered imaging studies which included CXR & subsequent R shoulder x-ray, I independently visualized and interpreted imaging which showed:  CXR: No active cardiopulmonary disease. Interval development of a lytic lesion within the right humeral head. Dedicated right shoulder series is recommended for further evaluation. Correlation with clinical examination would also be helpful- Per radiology read. Discussed w/ patient, states had injury in August of this year, still has discomfort at times, no erythema/warmth, ROM intact- does not seem consistent w/ septic joint, R shoulder x-ray: Negative, NVI distally.   Chest x-ray without infiltrate to suggest community-acquired pneumonia.  There are also no findings of pulmonary edema or pneumothorax.  Low risk Wells, doubt pulmonary embolism.  Patient has no signs of AOM/AOE/mastoiditis.  Oropharynx is clear.  Centor score is a 0, I doubt strep pharyngitis.  No signs of RPA/PTA.  No meningismus.   Abdomen is nontender without peritoneal signs and patient is tolerating p.o.  No urinary symptoms to raise concern for UTI.  Overall seems consistent with a viral process, Covid/influenza testing negative, however given his COVID-19 exposure in early this in the illness feel that this still remains a possibility.  We discussed home isolation for precautionary purposes. Will tx supportively. I discussed results, treatment plan, need for follow-up, and return precautions  with the patient. Provided opportunity for questions, patient confirmed understanding and is in agreement with plan.   Portions of this note were generated with Scientist, clinical (histocompatibility and immunogenetics). Dictation errors may occur despite best attempts at proofreading.  Final Clinical Impression(s) / ED Diagnoses Final diagnoses:  Chills    Rx / DC Orders ED Discharge Orders         Ordered    fluticasone (FLONASE) 50 MCG/ACT nasal spray  Daily        08/24/20 0326    benzonatate (TESSALON) 100 MG capsule  Every 8 hours        08/24/20 0326           Cherly Anderson, [name] 08/24/20 0419    Nira Conn, MD 08/26/20 [phone]

## 2020-08-23 NOTE — ED Triage Notes (Signed)
Per ems: Pt coming in c/o sore throat, muscle aches, fever, cough and chills x2 days. 1 shot of pfizer 2 months ago. 100% room air and lungs clear. Temp 98.3

## 2020-08-23 NOTE — ED Notes (Signed)
Pt in in bed resting, respirations even and unlabored. Pt is talking to his self.

## 2020-08-24 ENCOUNTER — Emergency Department (HOSPITAL_COMMUNITY): Payer: Medicare Other

## 2020-08-24 DIAGNOSIS — R6883 Chills (without fever): Secondary | ICD-10-CM | POA: Diagnosis not present

## 2020-08-24 LAB — RESPIRATORY PANEL BY RT PCR (FLU A&B, COVID)
Influenza A by PCR: NEGATIVE
Influenza B by PCR: NEGATIVE
SARS Coronavirus 2 by RT PCR: NEGATIVE

## 2020-08-24 MED ORDER — ACETAMINOPHEN 325 MG PO TABS
650.0000 mg | ORAL_TABLET | Freq: Once | ORAL | Status: AC
  Administered 2020-08-24: 650 mg via ORAL
  Filled 2020-08-24: qty 2

## 2020-08-24 MED ORDER — BENZONATATE 100 MG PO CAPS: 100.0000 mg | ORAL_CAPSULE | Freq: Three times a day (TID) | ORAL | 0 refills | Status: DC

## 2020-08-24 MED ORDER — FLUTICASONE PROPIONATE 50 MCG/ACT NA SUSP: 1.0000 | Freq: Every day | NASAL | 0 refills | Status: DC

## 2020-08-24 NOTE — ED Notes (Signed)
Pt refused to have vital retaken .

## 2020-08-24 NOTE — Discharge Instructions (Addendum)
You were seen in the ER today for chills, body aches, congestion, and cough.   Your chest x-ray did not show pneumonia.  Your covid & flu test were negative. However, given your recent covid exposure with your boss, we are concerned you could be early in your illness and still potentially have covid- please continue to isolate and consider repeat testing in 72 hours.   We suspect your symptoms are overall viral in nature.  We are sending you home with flonase to use 1 spray per nostril daily as needed for congestion & tessalon to take every 8 hours as needed for cough.   We have prescribed you new medication(s) today. Discuss the medications prescribed today with your pharmacist as they can have adverse effects and interactions with your other medicines including over the counter and prescribed medications. Seek medical evaluation if you start to experience new or abnormal symptoms after taking one of these medicines, seek care immediately if you start to experience difficulty breathing, feeling of your throat closing, facial swelling, or rash as these could be indications of a more serious allergic reaction  Please follow up with primary care within 3 days. Return to the ER for new or worsening symptoms including but not limited to worsened pain, chest pain, passing out, coughing up blood, trouble breathing, inability to keep fluids down, abdominal pain, neck stiffness, or any other concerns.

## 2020-09-20 ENCOUNTER — Ambulatory Visit (HOSPITAL_COMMUNITY)
Admission: EM | Admit: 2020-09-20 | Discharge: 2020-09-20 | Disposition: A | Discharge status: Home / Self Care | Payer: Medicare Other | Attending: Family Medicine | Admitting: Family Medicine

## 2020-09-20 ENCOUNTER — Encounter (HOSPITAL_COMMUNITY): Payer: Self-pay

## 2020-09-20 ENCOUNTER — Other Ambulatory Visit: Payer: Self-pay

## 2020-09-20 DIAGNOSIS — S61411A Laceration without foreign body of right hand, initial encounter: Secondary | ICD-10-CM | POA: Diagnosis not present

## 2020-09-20 DIAGNOSIS — Z23 Encounter for immunization: Secondary | ICD-10-CM

## 2020-09-20 MED ORDER — TETANUS-DIPHTH-ACELL PERTUSSIS 5-2.5-18.5 LF-MCG/0.5 IM SUSY: 0.5000 mL | PREFILLED_SYRINGE | Freq: Once | INTRAMUSCULAR | Status: DC

## 2020-09-20 MED ORDER — TETANUS-DIPHTH-ACELL PERTUSSIS 5-2.5-18.5 LF-MCG/0.5 IM SUSY
PREFILLED_SYRINGE | INTRAMUSCULAR | Status: AC
  Filled 2020-09-20: qty 0.5

## 2020-09-20 NOTE — Discharge Instructions (Signed)
Keep clean and reasonably dry Watch for infection Sutures out in 7-10 days Limit heavy use of hand

## 2020-09-20 NOTE — ED Triage Notes (Signed)
Pt in with c/o right hand laceration that happened yesterday while he was taking a metal sink out of a box.  Unsure of last tetanus shot

## 2020-09-20 NOTE — ED Provider Notes (Signed)
[facility]    CSN: [id number] Arrival date & time: 09/20/20  1118      History   Chief Complaint Chief Complaint  Patient presents with  . Laceration    HPI Cory Floyd is a 44 y.o. male.   HPI   Cut hand yesterday Here for repair   Past Medical History:  Diagnosis Date  . 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 Anginal pain (HCC)   . Arthritis   . Bipolar 1 disorder (HCC)   . COVID-19   . Depressed   . Dyspnea   . Fatty liver   . Schizophrenia [facility])     Patient Active Problem List   Diagnosis Date Noted  . Substance induced mood disorder (HCC) 07/26/2020  . Suicidal ideation   . Polysubstance dependence (HCC) 07/25/2020  . Bipolar affective disorder, depressed, moderate (HCC) 03/06/2020  . Arthritis 09/30/2019  . Gastroesophageal reflux disease without esophagitis 09/21/2016  . Obstructive sleep apnea 09/21/2016  . Morbid obesity with BMI of 40.0-44.9, adult (HCC) 08/15/2016  . Stable angina pectoris (HCC) 08/15/2016    Past Surgical History:  Procedure Laterality Date  . COLONOSCOPY  02/05/2014   diverticulosis, hyperplastic polyp x 2  . fracture arm Right   . KNEE SURGERY Right 2013  . NASAL SEPTUM SURGERY    . NASAL TURBINATE REDUCTION Bilateral 12/12/2016   Procedure: TURBINATE REDUCTION/SUBMUCOSAL RESECTION;  Surgeon: Linus Salmons, MD;  Location: [facility] ORS;  Service: ENT;  Laterality: Bilateral;       Home Medications    Prior to Admission medications   Medication Sig Start Date End Date Taking? Authorizing Provider  benzonatate (TESSALON) 100 MG capsule Take 1 capsule (100 mg total) by mouth every 8 (eight) hours. 08/24/20   Petrucelli, Samantha R, [name]  fluticasone (FLONASE) 50 MCG/ACT nasal spray Place 1 spray into both nostrils daily. 08/24/20   Petrucelli, Samantha R, [name]  naproxen (NAPROSYN) 500 MG tablet Take 1 tablet (500 mg total) by mouth 2 (two) times daily. 05/16/20   Renne Crigler, [name]  ARIPiprazole (ABILIFY)  30 MG tablet Take 1 tablet (30 mg total) by mouth daily. Patient not taking: Reported on 03/02/2020 10/28/19 05/16/20  Money, Gerlene Burdock, FNP  divalproex (DEPAKOTE) 500 MG DR tablet Take 1 tablet (500 mg total) by mouth daily. Patient not taking: Reported on 05/16/2020 03/08/20 05/16/20  Charm Rings, NP    Family History Family History  Problem Relation Age of Onset  . Hepatitis C Mother   . Cancer Mother   . Cancer Maternal Aunt   . Hypertension Maternal Grandmother     Social History Social History   Tobacco Use  . Smoking status: Current Every Day Smoker    Packs/day: 1.00    Years: 20.00    Pack years: 20.00    Types: Cigarettes  . Smokeless tobacco: Never Used  Vaping Use  . Vaping Use: Never used  Substance Use Topics  . Alcohol use: Yes    Comment: 1/2-5th pint of liquor  . Drug use: Yes    Types: Cocaine, Marijuana    Comment: heroin     Allergies   Carrot [daucus carota] and Penicillins   Review of Systems Review of Systems See hpi  Physical Exam Triage Vital Signs ED Triage Vitals  Enc Vitals Group     BP 09/20/20 1324 (!) 142/86     Pulse Rate 09/20/20 1324 80     Resp 09/20/20 1324 20  Temp 09/20/20 1324 97.7 F (36.5 C)     Temp Source 09/20/20 1324 Oral     SpO2 09/20/20 1324 99 %     Weight --      Height --      Head Circumference --      Peak Flow --      Pain Score 09/20/20 1309 7     Pain Loc --      Pain Edu? --      Excl. in GC? --    Floyd data found.  Updated Vital Signs BP (!) 142/86 (BP Location: Left Arm)   Pulse 80   Temp 97.7 F (36.5 C) (Oral)   Resp 20   SpO2 99%      Physical Exam Constitutional:      General: He is not in acute distress.    Appearance: He is well-developed.  HENT:     Head: Normocephalic and atraumatic.  Eyes:     Conjunctiva/sclera: Conjunctivae normal.     Pupils: Pupils are equal, round, and reactive to light.  Cardiovascular:     Rate and Rhythm: Normal rate.  Pulmonary:     Effort:  Pulmonary effort is normal. Floyd respiratory distress.  Abdominal:     General: There is Floyd distension.     Palpations: Abdomen is soft.  Musculoskeletal:        General: Normal range of motion.       Arms:     Cervical back: Normal range of motion.  Skin:    General: Skin is warm and dry.     Comments: Laceration present over the thenar eminence MCP joint  Neurological:     Mental Status: He is alert.     Sensory: Sensory deficit present.  Psychiatric:     Comments: Pleasant,  talkative       UC Treatments / Results  Labs (all labs ordered are listed, but only abnormal results are displayed) Labs Reviewed - Floyd data to display  EKG   Radiology Floyd results found.  Procedures Laceration Repair  Date/Time: 09/20/2020 2:52 PM Performed by: Eustace Moore, MD Authorized by: Eustace Moore, MD   Consent:    Consent obtained:  Verbal   Consent given by:  Patient   Risks discussed:  Infection and nerve damage   Alternatives discussed:  Floyd treatment Anesthesia (see MAR for exact dosages):    Anesthesia method:  Local infiltration   Local anesthetic:  Lidocaine 1% w/o epi Laceration details:    Location:  Hand   Hand location:  R palm   Length (cm):  2.5   Depth (mm):  10 Repair type:    Repair type:  Simple Pre-procedure details:    Preparation:  Patient was prepped and draped in usual sterile fashion Exploration:    Hemostasis achieved with:  Direct pressure   Wound exploration: wound explored through full range of motion     Wound extent: nerve damage     Contaminated: Floyd   Treatment:    Area cleansed with:  Soap and water   Amount of cleaning:  Extensive   Irrigation solution:  Sterile saline   Irrigation volume:  10   Irrigation method:  Syringe Skin repair:    Repair method:  Sutures   Suture size:  4-0   Suture material:  Nylon   Number of sutures:  3 Approximation:    Approximation:  Close Post-procedure details:    Dressing:  Bulky  dressing  Patient tolerance of procedure:  Tolerated well, Floyd immediate complications    Medications Ordered in UC Medications  Tdap (BOOSTRIX) injection 0.5 mL (0.5 mLs Intramuscular Not Given 09/20/20 1451)    Initial Impression / Assessment and Plan / UC Course  I have reviewed the triage vital signs and the nursing notes.  Pertinent labs & imaging results that were available during my care of the patient were reviewed by me and considered in my medical decision making (see chart for details).    Discussed with the patient that the laceration Rehman around the lateral area at the thumb where the nerve is. He states that he does have some numbness on the outside of his thumb to the tip. He states that he has numbness in his hand and limitations because of an old injury, anyway. He is not concerned about this numbness. I have recommended that he follow-up with a hand surgeon. Patient will consider Final Clinical Impressions(s) / UC Diagnoses   Final diagnoses:  Laceration of right hand without foreign body, initial encounter     Discharge Instructions     Keep clean and reasonably dry Watch for infection Sutures out in 7-10 days Limit heavy use of hand    ED Prescriptions    None     PDMP not reviewed this encounter.   Eustace Moore, MD 09/20/20 [phone]

## 2020-09-25 DIAGNOSIS — F1020 Alcohol dependence, uncomplicated: Secondary | ICD-10-CM | POA: Diagnosis not present

## 2020-09-25 DIAGNOSIS — I10 Essential (primary) hypertension: Secondary | ICD-10-CM | POA: Diagnosis not present

## 2020-09-25 DIAGNOSIS — Z20822 Contact with and (suspected) exposure to covid-19: Secondary | ICD-10-CM | POA: Diagnosis not present

## 2020-09-25 DIAGNOSIS — X58XXXA Exposure to other specified factors, initial encounter: Secondary | ICD-10-CM | POA: Insufficient documentation

## 2020-09-25 DIAGNOSIS — S60571A Other superficial bite of hand of right hand, initial encounter: Secondary | ICD-10-CM | POA: Insufficient documentation

## 2020-09-25 DIAGNOSIS — E119 Type 2 diabetes mellitus without complications: Secondary | ICD-10-CM | POA: Insufficient documentation

## 2020-09-25 DIAGNOSIS — F1590 Other stimulant use, unspecified, uncomplicated: Secondary | ICD-10-CM | POA: Insufficient documentation

## 2020-09-25 DIAGNOSIS — R0789 Other chest pain: Secondary | ICD-10-CM | POA: Diagnosis not present

## 2020-09-25 DIAGNOSIS — Z8616 Personal history of COVID-19: Secondary | ICD-10-CM | POA: Diagnosis not present

## 2020-09-25 DIAGNOSIS — F17210 Nicotine dependence, cigarettes, uncomplicated: Secondary | ICD-10-CM | POA: Diagnosis not present

## 2020-09-25 DIAGNOSIS — F1420 Cocaine dependence, uncomplicated: Secondary | ICD-10-CM | POA: Diagnosis not present

## 2020-09-25 DIAGNOSIS — F250 Schizoaffective disorder, bipolar type: Secondary | ICD-10-CM | POA: Diagnosis not present

## 2020-09-25 DIAGNOSIS — Y900 Blood alcohol level of less than 20 mg/100 ml: Secondary | ICD-10-CM | POA: Insufficient documentation

## 2020-09-25 DIAGNOSIS — F1910 Other psychoactive substance abuse, uncomplicated: Secondary | ICD-10-CM | POA: Diagnosis not present

## 2020-09-25 DIAGNOSIS — F1520 Other stimulant dependence, uncomplicated: Secondary | ICD-10-CM | POA: Insufficient documentation

## 2020-09-25 DIAGNOSIS — F600 Paranoid personality disorder: Secondary | ICD-10-CM | POA: Diagnosis present

## 2020-09-26 ENCOUNTER — Encounter (HOSPITAL_COMMUNITY): Payer: Self-pay | Admitting: Student

## 2020-09-26 ENCOUNTER — Emergency Department (HOSPITAL_COMMUNITY)
Admission: EM | Admit: 2020-09-26 | Discharge: 2020-09-26 | Disposition: A | Discharge status: Home / Self Care | Payer: Medicare Other | Attending: Emergency Medicine | Admitting: Emergency Medicine

## 2020-09-26 ENCOUNTER — Other Ambulatory Visit: Payer: Self-pay

## 2020-09-26 ENCOUNTER — Emergency Department (HOSPITAL_COMMUNITY)
Admission: EM | Admit: 2020-09-26 | Discharge: 2020-09-27 | Disposition: A | Discharge status: Other Acute Inpatient Hospital | Payer: Medicare Other | Source: Home / Self Care | Attending: Emergency Medicine | Admitting: Emergency Medicine

## 2020-09-26 ENCOUNTER — Emergency Department (HOSPITAL_COMMUNITY): Payer: Medicare Other

## 2020-09-26 ENCOUNTER — Encounter (HOSPITAL_COMMUNITY): Payer: Self-pay | Admitting: Emergency Medicine

## 2020-09-26 DIAGNOSIS — E119 Type 2 diabetes mellitus without complications: Secondary | ICD-10-CM | POA: Insufficient documentation

## 2020-09-26 DIAGNOSIS — Z8616 Personal history of COVID-19: Secondary | ICD-10-CM | POA: Insufficient documentation

## 2020-09-26 DIAGNOSIS — F250 Schizoaffective disorder, bipolar type: Secondary | ICD-10-CM | POA: Insufficient documentation

## 2020-09-26 DIAGNOSIS — R0789 Other chest pain: Secondary | ICD-10-CM | POA: Insufficient documentation

## 2020-09-26 DIAGNOSIS — I10 Essential (primary) hypertension: Secondary | ICD-10-CM | POA: Insufficient documentation

## 2020-09-26 DIAGNOSIS — F1910 Other psychoactive substance abuse, uncomplicated: Secondary | ICD-10-CM | POA: Diagnosis not present

## 2020-09-26 DIAGNOSIS — F1420 Cocaine dependence, uncomplicated: Secondary | ICD-10-CM | POA: Insufficient documentation

## 2020-09-26 DIAGNOSIS — L089 Local infection of the skin and subcutaneous tissue, unspecified: Secondary | ICD-10-CM

## 2020-09-26 DIAGNOSIS — S60571A Other superficial bite of hand of right hand, initial encounter: Secondary | ICD-10-CM | POA: Insufficient documentation

## 2020-09-26 DIAGNOSIS — Z20822 Contact with and (suspected) exposure to covid-19: Secondary | ICD-10-CM | POA: Insufficient documentation

## 2020-09-26 DIAGNOSIS — X58XXXA Exposure to other specified factors, initial encounter: Secondary | ICD-10-CM | POA: Insufficient documentation

## 2020-09-26 DIAGNOSIS — F22 Delusional disorders: Secondary | ICD-10-CM

## 2020-09-26 DIAGNOSIS — F1990 Other psychoactive substance use, unspecified, uncomplicated: Secondary | ICD-10-CM

## 2020-09-26 DIAGNOSIS — F1520 Other stimulant dependence, uncomplicated: Secondary | ICD-10-CM | POA: Insufficient documentation

## 2020-09-26 DIAGNOSIS — F17210 Nicotine dependence, cigarettes, uncomplicated: Secondary | ICD-10-CM | POA: Insufficient documentation

## 2020-09-26 DIAGNOSIS — Y900 Blood alcohol level of less than 20 mg/100 ml: Secondary | ICD-10-CM | POA: Insufficient documentation

## 2020-09-26 DIAGNOSIS — F1020 Alcohol dependence, uncomplicated: Secondary | ICD-10-CM | POA: Insufficient documentation

## 2020-09-26 HISTORY — DX: Type 2 diabetes mellitus without complications: E11.9

## 2020-09-26 HISTORY — DX: Essential (primary) hypertension: I10

## 2020-09-26 LAB — BASIC METABOLIC PANEL
Anion gap: 9 (ref 5–15)
BUN: 15 mg/dL (ref 6–20)
CO2: 25 mmol/L (ref 22–32)
Calcium: 9.6 mg/dL (ref 8.9–10.3)
Chloride: 106 mmol/L (ref 98–111)
Creatinine, Ser: 1.16 mg/dL (ref 0.61–1.24)
GFR, Estimated: >60 mL/min (ref >60)
Glucose, Bld: 104 mg/dL — ABNORMAL HIGH (ref 70–99)
Potassium: 3.6 mmol/L (ref 3.5–5.1)
Sodium: 140 mmol/L (ref 135–145)

## 2020-09-26 LAB — COMPREHENSIVE METABOLIC PANEL
ALT: 28 U/L (ref 0–44)
AST: 30 U/L (ref 15–41)
Albumin: 4.3 g/dL (ref 3.5–5.0)
Alkaline Phosphatase: 70 U/L (ref 38–126)
Anion gap: 11 (ref 5–15)
BUN: 15 mg/dL (ref 6–20)
CO2: 22 mmol/L (ref 22–32)
Calcium: 9.2 mg/dL (ref 8.9–10.3)
Chloride: 105 mmol/L (ref 98–111)
Creatinine, Ser: 1.03 mg/dL (ref 0.61–1.24)
GFR, Estimated: >60 mL/min (ref >60)
Glucose, Bld: 92 mg/dL (ref 70–99)
Potassium: 3.7 mmol/L (ref 3.5–5.1)
Sodium: 138 mmol/L (ref 135–145)
Total Bilirubin: 0.8 mg/dL (ref 0.3–1.2)
Total Protein: 7.6 g/dL (ref 6.5–8.1)

## 2020-09-26 LAB — SALICYLATE LEVEL: Salicylate Lvl: <7 mg/dL — ABNORMAL LOW (ref 7.0–30.0)

## 2020-09-26 LAB — CBC
HCT: 38.8 % — ABNORMAL LOW (ref 39.0–52.0)
HCT: 41.8 % (ref 39.0–52.0)
Hemoglobin: 12.8 g/dL — ABNORMAL LOW (ref 13.0–17.0)
Hemoglobin: 13.3 g/dL (ref 13.0–17.0)
MCH: 30.3 pg (ref 26.0–34.0)
MCH: 29.9 pg (ref 26.0–34.0)
MCHC: 33 g/dL (ref 30.0–36.0)
MCHC: 31.8 g/dL (ref 30.0–36.0)
MCV: 91.9 fL (ref 80.0–100.0)
MCV: 93.9 fL (ref 80.0–100.0)
Platelets: 166 10*3/uL (ref 150–400)
Platelets: 194 10*3/uL (ref 150–400)
RBC: 4.22 MIL/uL (ref 4.22–5.81)
RBC: 4.45 MIL/uL (ref 4.22–5.81)
RDW: 13.1 % (ref 11.5–15.5)
RDW: 13.1 % (ref 11.5–15.5)
WBC: 5.9 10*3/uL (ref 4.0–10.5)
WBC: 4.6 10*3/uL (ref 4.0–10.5)
nRBC: 0 % (ref 0.0–0.2)
nRBC: 0 % (ref 0.0–0.2)

## 2020-09-26 LAB — ETHANOL: Alcohol, Ethyl (B): <10 mg/dL (ref <10)

## 2020-09-26 LAB — ACETAMINOPHEN LEVEL: Acetaminophen (Tylenol), Serum: <10 ug/mL — ABNORMAL LOW (ref 10–30)

## 2020-09-26 LAB — TROPONIN I (HIGH SENSITIVITY): Troponin I (High Sensitivity): 9 ng/L (ref <18)

## 2020-09-26 MED ORDER — SULFAMETHOXAZOLE-TRIMETHOPRIM 800-160 MG PO TABS: 1.0000 | ORAL_TABLET | Freq: Two times a day (BID) | ORAL | Status: DC

## 2020-09-26 MED ORDER — SODIUM CHLORIDE 0.9 % IV BOLUS
1000.0000 mL | Freq: Once | INTRAVENOUS | Status: AC
  Administered 2020-09-26: 1000 mL via INTRAVENOUS

## 2020-09-26 MED ORDER — SULFAMETHOXAZOLE-TRIMETHOPRIM 800-160 MG PO TABS: 1.0000 | ORAL_TABLET | Freq: Two times a day (BID) | ORAL | 0 refills | Status: AC

## 2020-09-26 MED ORDER — SULFAMETHOXAZOLE-TRIMETHOPRIM 800-160 MG PO TABS
1.0000 | ORAL_TABLET | Freq: Once | ORAL | Status: AC
  Administered 2020-09-27: 1 via ORAL
  Filled 2020-09-26: qty 1

## 2020-09-26 MED ORDER — LORAZEPAM 1 MG PO TABS
1.0000 mg | ORAL_TABLET | Freq: Once | ORAL | Status: AC
  Administered 2020-09-26: 1 mg via ORAL
  Filled 2020-09-26: qty 1

## 2020-09-26 NOTE — ED Notes (Signed)
Pt ran from the lobby to the back hall, pt was having a panic attack, pt states "they are going to get me, please dont make me go back to the lobby" pt very paranoid and responding to internal stimuli. Pt seen by Dr. Jacqulyn Bath in hallway, ordered PO ativan, attempted to take pt to triage to be changed in purple scrubs and medicated, pt to paranoid, pt planted his feet in back hallway and states he cant go to the lobby cause people will hurt him. Pt states he also can not change into purple scrubs or give up his phone.

## 2020-09-26 NOTE — Discharge Instructions (Signed)
You were seen in the emergency department after utilizing cocaine and methamphetamine.  You received fluids in the ER.  Your labs were reassuring.  Please try to stop using meth and cocaine.  Please follow-up with our outpatient resource guide as well as [facility].  Return to the ER for new or worsening symptoms or any other concerns.

## 2020-09-26 NOTE — ED Provider Notes (Signed)
[facility]-EMERGENCY DEPT Provider Note   CSN: [id number] Arrival date & time: 09/25/20  2340     History Chief Complaint  Patient presents with  . Paranoid    Cory Floyd is a 44 y.o. male with a history of bipolar disorder, schizophrenia, alcohol use disorder, substance-induced mood disorder with polysubstance abuse, and OSA who presents to the emergency department via EMS stating to me that he "wants a checkup" after drinking and using drugs tonight.  Patient states that he drank some alcohol and used methamphetamine as well as crack cocaine.  He does not quantify amount of alcohol or drug use.  He states he was feeling funny, per EMS and initial staff having paranoia, this seems to have resolved on my assessment.  He states he "just does not feel quite right".  No alleviating or aggravating factors to his symptoms.  He denies fever, chills, chest pain, shortness of breath, abdominal pain, or seizure activity.  Denies SI, HI, or hallucinations to me.  He denies head injury.  HPI     Past Medical History:  Diagnosis Date  . 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 Anginal pain (HCC)   . Arthritis   . Bipolar 1 disorder (HCC)   . COVID-19   . Depressed   . Dyspnea   . Fatty liver   . Schizophrenia [facility])     Patient Active Problem List   Diagnosis Date Noted  . Substance induced mood disorder (HCC) 07/26/2020  . Suicidal ideation   . Polysubstance dependence (HCC) 07/25/2020  . Bipolar affective disorder, depressed, moderate (HCC) 03/06/2020  . Arthritis 09/30/2019  . Gastroesophageal reflux disease without esophagitis 09/21/2016  . Obstructive sleep apnea 09/21/2016  . Morbid obesity with BMI of 40.0-44.9, adult (HCC) 08/15/2016  . Stable angina pectoris (HCC) 08/15/2016    Past Surgical History:  Procedure Laterality Date  . COLONOSCOPY  02/05/2014   diverticulosis, hyperplastic polyp x 2  . fracture arm Right   . KNEE SURGERY Right 2013   . NASAL SEPTUM SURGERY    . NASAL TURBINATE REDUCTION Bilateral 12/12/2016   Procedure: TURBINATE REDUCTION/SUBMUCOSAL RESECTION;  Surgeon: Linus Salmons, MD;  Location: [facility] ORS;  Service: ENT;  Laterality: Bilateral;       Family History  Problem Relation Age of Onset  . Hepatitis C Mother   . Cancer Mother   . Cancer Maternal Aunt   . Hypertension Maternal Grandmother     Social History   Tobacco Use  . Smoking status: Current Every Day Smoker    Packs/day: 1.00    Years: 20.00    Pack years: 20.00    Types: Cigarettes  . Smokeless tobacco: Never Used  Vaping Use  . Vaping Use: Never used  Substance Use Topics  . Alcohol use: Yes    Comment: 1/2-5th pint of liquor  . Drug use: Yes    Types: Cocaine, Marijuana    Comment: heroin    Home Medications Prior to Admission medications   Medication Sig Start Date End Date Taking? Authorizing Provider  benzonatate (TESSALON) 100 MG capsule Take 1 capsule (100 mg total) by mouth every 8 (eight) hours. 08/24/20   Petrucelli, Samantha R, [name]  fluticasone (FLONASE) 50 MCG/ACT nasal spray Place 1 spray into both nostrils daily. 08/24/20   Petrucelli, Samantha R, [name]  naproxen (NAPROSYN) 500 MG tablet Take 1 tablet (500 mg total) by mouth 2 (two) times daily. 05/16/20   Renne Crigler, [name]  ARIPiprazole (ABILIFY) 30 MG tablet Take 1 tablet (30 mg total) by mouth daily. Patient not taking: Reported on 03/02/2020 10/28/19 05/16/20  Money, Gerlene Burdock, FNP  divalproex (DEPAKOTE) 500 MG DR tablet Take 1 tablet (500 mg total) by mouth daily. Patient not taking: Reported on 05/16/2020 03/08/20 05/16/20  Charm Rings, NP    Allergies    Carrot [daucus carota] and Penicillins  Review of Systems   Review of Systems  Constitutional: Negative for chills and fever.  Respiratory: Negative for shortness of breath.   Cardiovascular: Negative for chest pain.  Gastrointestinal: Negative for abdominal pain and vomiting.  Genitourinary: Negative  for dysuria.  Neurological: Negative for seizures and syncope.  Psychiatric/Behavioral: Negative for hallucinations and suicidal ideas.       Positive for "just not feeling quite right"  All other systems reviewed and are negative.   Physical Exam Updated Vital Signs BP 105/76   Pulse 92   Temp 98.9 F (37.2 C) (Oral)   Resp 18   SpO2 100%   Physical Exam Vitals and nursing note reviewed.  Constitutional:      General: He is not in acute distress.    Appearance: He is well-developed. He is not toxic-appearing.     Comments: Patient sleeping but is arousable.  HENT:     Head: Normocephalic and atraumatic.  Eyes:     General:        Right eye: No discharge.        Left eye: No discharge.     Conjunctiva/sclera: Conjunctivae normal.  Cardiovascular:     Rate and Rhythm: Normal rate and regular rhythm.  Pulmonary:     Effort: Pulmonary effort is normal. No respiratory distress.     Breath sounds: Normal breath sounds. No wheezing, rhonchi or rales.  Abdominal:     General: There is no distension.     Palpations: Abdomen is soft.     Tenderness: There is no abdominal tenderness. There is no guarding or rebound.  Musculoskeletal:     Cervical back: Neck supple.     Comments: Right hand with sutured wound present.  No purulent drainage or significant warmth/erythema.  Skin:    General: Skin is warm and dry.     Findings: No rash.  Neurological:     Comments: Clear speech.  Moving all extremities.  Strength and sensation grossly intact x4.  No facial droop.  Psychiatric:        Speech: Speech is delayed.        Thought Content: Thought content does not include homicidal or suicidal ideation. Thought content does not include homicidal or suicidal plan.     ED Results / Procedures / Treatments   Labs (all labs ordered are listed, but only abnormal results are displayed) Labs Reviewed  CBC - Abnormal; Notable for the following components:      Result Value   Hemoglobin  12.8 (*)    HCT 38.8 (*)    All other components within normal limits  SALICYLATE LEVEL - Abnormal; Notable for the following components:   Salicylate Lvl <7.0 (*)    All other components within normal limits  ACETAMINOPHEN LEVEL - Abnormal; Notable for the following components:   Acetaminophen (Tylenol), Serum <10 (*)    All other components within normal limits  COMPREHENSIVE METABOLIC PANEL  ETHANOL  RAPID URINE DRUG SCREEN, HOSP PERFORMED    EKG None  Radiology No results found.  Procedures Procedures (including critical care time)  Medications  Ordered in ED Medications  sodium chloride 0.9 % bolus 1,000 mL (0 mLs Intravenous Stopped 09/26/20 0217)    ED Course  I have reviewed the triage vital signs and the nursing notes.  Pertinent labs & imaging results that were available during my care of the patient were reviewed by me and considered in my medical decision making (see chart for details).    MDM Rules/Calculators/A&P                         Patient presents to the ED requesting to be checked out after drug and alcohol use.  He is nontoxic, initial tachycardia resolved on my exam.  Additional history obtained:  Additional history obtained from chart review nursing note reviewed.  Lab Tests:  I Ordered, reviewed, and interpreted labs, which included:  CBC, CMP, ethanol level, acetaminophen level, and salicylate level: Fairly unremarkable, similar to patient's prior lab ranges.  UDS is pending.  Patient given fluids and observed in the emergency department  On reassessment patient is alert and oriented x4.  He continues to deny SI, HI, or hallucinations.  He is ambulatory and tolerating p.o.  He appears appropriate for discharge at this time.  I suspect his initial paranoia was drug-induced, resolved, does not appear to be acutely psychotic.resources will be provided.  I discussed results, treatment plan, need for follow-up, and return precautions with the patient.  Provided opportunity for questions, patient confirmed understanding and is in agreement with plan.   Portions of this note were generated with Scientist, clinical (histocompatibility and immunogenetics). Dictation errors may occur despite best attempts at proofreading.  Final Clinical Impression(s) / ED Diagnoses Final diagnoses:  Substance use    Rx / DC Orders ED Discharge Orders    None       Cherly Anderson, [name] 09/26/20 0415    Dione Booze, MD 09/26/20 [phone]

## 2020-09-26 NOTE — ED Notes (Signed)
Pt walked to restroom without assistance.

## 2020-09-26 NOTE — ED Triage Notes (Signed)
Pt BIB [facility], c/o chest pain that started an hour pta, pain is non-radiating. Denies shortness of breath. Given 324mg  asa and 1NTG, reports increased chest tightness. EMS VSS.

## 2020-09-26 NOTE — ED Notes (Signed)
Pt came in with police and EMS with c/o paranoid behavior. Pt having persecutory delusions. Pt easy to scare. Pt admits to "doing lines". Current HR is 110-125.

## 2020-09-26 NOTE — ED Provider Notes (Signed)
[facility] EMERGENCY DEPARTMENT Provider Note   CSN: [id number] Arrival date & time: 09/26/20  1934     History Chief Complaint  Patient presents with  . Chest Pain    Cory Floyd is a 44 y.o. male.  Pt presents to the ED today with chest pain.  He said it started about 1 hr pta.  He denies sob or n/v.  The pt did call EMS and was given asa and nitro.  Pt said his chest feels like someone punched it.  He said it feels tight.  Pt was at [facility] last night and admitted to drinking alcohol and using meth and cocaine.  Pt denies doing drugs now, but the police are around.        Past Medical History:  Diagnosis Date  . 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 Anginal pain (HCC)   . Arthritis   . Bipolar 1 disorder (HCC)   . COVID-19   . Depressed   . Diabetes mellitus without complication (HCC)   . Dyspnea   . Fatty liver   . Hypertension   . Schizophrenia [facility])     Patient Active Problem List   Diagnosis Date Noted  . Substance induced mood disorder (HCC) 07/26/2020  . Suicidal ideation   . Polysubstance dependence (HCC) 07/25/2020  . Bipolar affective disorder, depressed, moderate (HCC) 03/06/2020  . Arthritis 09/30/2019  . Gastroesophageal reflux disease without esophagitis 09/21/2016  . Obstructive sleep apnea 09/21/2016  . Morbid obesity with BMI of 40.0-44.9, adult (HCC) 08/15/2016  . Stable angina pectoris (HCC) 08/15/2016    Past Surgical History:  Procedure Laterality Date  . COLONOSCOPY  02/05/2014   diverticulosis, hyperplastic polyp x 2  . fracture arm Right   . KNEE SURGERY Right 2013  . NASAL SEPTUM SURGERY    . NASAL TURBINATE REDUCTION Bilateral 12/12/2016   Procedure: TURBINATE REDUCTION/SUBMUCOSAL RESECTION;  Surgeon: Linus Salmons, MD;  Location: [facility] ORS;  Service: ENT;  Laterality: Bilateral;       Family History  Problem Relation Age of Onset  . Hepatitis C Mother   . Cancer Mother   . Cancer Maternal Aunt   .  Hypertension Maternal Grandmother     Social History   Tobacco Use  . Smoking status: Current Every Day Smoker    Packs/day: 1.00    Years: 20.00    Pack years: 20.00    Types: Cigarettes  . Smokeless tobacco: Never Used  Vaping Use  . Vaping Use: Never used  Substance Use Topics  . Alcohol use: Yes    Comment: 1/2-5th pint of liquor  . Drug use: Yes    Types: Cocaine, Marijuana    Comment: heroin    Home Medications Prior to Admission medications   Medication Sig Start Date End Date Taking? Authorizing Provider  benzonatate (TESSALON) 100 MG capsule Take 1 capsule (100 mg total) by mouth every 8 (eight) hours. 08/24/20   Petrucelli, Samantha R, [name]  fluticasone (FLONASE) 50 MCG/ACT nasal spray Place 1 spray into both nostrils daily. 08/24/20   Petrucelli, Samantha R, [name]  naproxen (NAPROSYN) 500 MG tablet Take 1 tablet (500 mg total) by mouth 2 (two) times daily. 05/16/20   Renne Crigler, [name]  sulfamethoxazole-trimethoprim (BACTRIM DS) 800-160 MG tablet Take 1 tablet by mouth 2 (two) times daily for 7 days. 09/26/20 10/03/20  Jacalyn Lefevre, MD  ARIPiprazole (ABILIFY) 30 MG tablet Take 1 tablet (30 mg total) by mouth daily.  Patient not taking: Reported on 03/02/2020 10/28/19 05/16/20  Money, Gerlene Burdock, FNP  divalproex (DEPAKOTE) 500 MG DR tablet Take 1 tablet (500 mg total) by mouth daily. Patient not taking: Reported on 05/16/2020 03/08/20 05/16/20  Charm Rings, NP    Allergies    Carrot [daucus carota] and Penicillins  Review of Systems   Review of Systems  Cardiovascular: Positive for chest pain.  All other systems reviewed and are negative.   Physical Exam Updated Vital Signs BP 114/60 (BP Location: Right Arm)   Pulse 99   Temp 98.1 F (36.7 C) (Oral)   Resp 18   Ht 5\' 9"  (1.753 m)   Wt 88.9 kg   SpO2 99%   BMI 28.94 kg/m   Physical Exam Vitals and nursing note reviewed.  Constitutional:      Appearance: He is well-developed.  HENT:     Head:  Normocephalic and atraumatic.  Eyes:     Extraocular Movements: Extraocular movements intact.     Pupils: Pupils are equal, round, and reactive to light.  Cardiovascular:     Rate and Rhythm: Normal rate and regular rhythm.     Heart sounds: Normal heart sounds.  Pulmonary:     Effort: Pulmonary effort is normal.     Breath sounds: Normal breath sounds.  Chest:    Abdominal:     General: Bowel sounds are normal.     Palpations: Abdomen is soft.  Musculoskeletal:        General: Normal range of motion.     Cervical back: Normal range of motion and neck supple.  Skin:    General: Skin is warm.     Capillary Refill: Capillary refill takes less than 2 seconds.  Neurological:     General: No focal deficit present.     Mental Status: He is alert and oriented to person, place, and time.  Psychiatric:        Behavior: Behavior is agitated.        Thought Content: Thought content is paranoid.     ED Results / Procedures / Treatments   Labs (all labs ordered are listed, but only abnormal results are displayed) Labs Reviewed  BASIC METABOLIC PANEL - Abnormal; Notable for the following components:      Result Value   Glucose, Bld 104 (*)    All other components within normal limits  RESP PANEL BY RT-PCR (FLU A&B, COVID) ARPGX2  CBC  VALPROIC ACID LEVEL  RAPID URINE DRUG SCREEN, HOSP PERFORMED  ETHANOL  TROPONIN I (HIGH SENSITIVITY)  TROPONIN I (HIGH SENSITIVITY)    EKG EKG Interpretation  Date/Time:  Sunday September 26 2020 19:37:36 EST Ventricular Rate:  97 PR Interval:  126 QRS Duration: 84 QT Interval:  336 QTC Calculation: 426 R Axis:   60 Text Interpretation: Normal sinus rhythm Normal ECG No significant change since last tracing Confirmed by [id number] [id number]) on 09/26/2020 9:02:35 PM   Radiology DG Chest 2 View  Result Date: 09/26/2020 CLINICAL DATA:  Chest pain the left of sternum for 1 hour EXAM: CHEST - 2 VIEW COMPARISON:  Radiograph 08/23/2020  FINDINGS: No consolidation, features of edema, pneumothorax, or effusion. Pulmonary vascularity is normally distributed. The cardiomediastinal contours are unremarkable. No acute osseous or soft tissue abnormality. A lesion previously seen in the right humeral head is less well visualized on this exam due to shoulder rotation. IMPRESSION: 1.  No acute cardiopulmonary abnormality. 2. A lesion previously seen in the right humeral head is  less well visualized on this exam due to shoulder rotation. Electronically Signed   By: Kreg Shropshire M.D.   On: 09/26/2020 20:19    Procedures .Suture Removal  Date/Time: 09/26/2020 9:42 PM Performed by: Jacalyn Lefevre, MD Authorized by: Jacalyn Lefevre, MD   Consent:    Consent obtained:  Verbal   Consent given by:  Patient   Risks discussed:  Bleeding and pain   Alternatives discussed:  No treatment Location:    Location:  Upper extremity   Upper extremity location:  Hand Procedure details:    Wound appearance:  Purulent and red   Number of sutures removed:  3 Post-procedure details:    Post-removal:  No dressing applied   Patient tolerance of procedure:  Tolerated well, no immediate complications   (including critical care time)  Medications Ordered in ED Medications  sulfamethoxazole-trimethoprim (BACTRIM DS) 800-160 MG per tablet 1 tablet (has no administration in time range)  sulfamethoxazole-trimethoprim (BACTRIM DS) 800-160 MG per tablet 1 tablet (has no administration in time range)  LORazepam (ATIVAN) tablet 1 mg (1 mg Oral Given 09/26/20 2108)    ED Course  I have reviewed the triage vital signs and the nursing notes.  Pertinent labs & imaging results that were available during my care of the patient were reviewed by me and considered in my medical decision making (see chart for details).    MDM Rules/Calculators/A&P                          Pt had stitches placed in his right hand on 11/29.  The wound is now infected, so I removed  3 stitches and will start him on bactrim.  Pt said he's not been taking his psych meds (depakote, abilify, trazodone) for at least 6 months.  He has not been sleeping well and wants to see psych.  He is extremely paranoid, but is not suicidal or homicidal.  I think he would do well as an inpatient, but he is not IVC'd.  CP is not cardiac in nature and he is medically clear.  Final Clinical Impression(s) / ED Diagnoses Final diagnoses:  Paranoia (HCC)  Bite wound of right hand with infection, initial encounter    Rx / DC Orders ED Discharge Orders         Ordered    sulfamethoxazole-trimethoprim (BACTRIM DS) 800-160 MG tablet  2 times daily        09/26/20 2322           Jacalyn Lefevre, MD 09/26/20 2323

## 2020-09-27 ENCOUNTER — Ambulatory Visit (HOSPITAL_COMMUNITY)
Admission: EM | Admit: 2020-09-27 | Discharge: 2020-09-27 | Disposition: A | Discharge status: Home / Self Care | Payer: Medicare Other

## 2020-09-27 DIAGNOSIS — F1910 Other psychoactive substance abuse, uncomplicated: Secondary | ICD-10-CM | POA: Diagnosis not present

## 2020-09-27 DIAGNOSIS — F1994 Other psychoactive substance use, unspecified with psychoactive substance-induced mood disorder: Secondary | ICD-10-CM

## 2020-09-27 LAB — ETHANOL: Alcohol, Ethyl (B): <10 mg/dL (ref <10)

## 2020-09-27 LAB — RESP PANEL BY RT-PCR (FLU A&B, COVID) ARPGX2
Influenza A by PCR: NEGATIVE
Influenza B by PCR: NEGATIVE
SARS Coronavirus 2 by RT PCR: NEGATIVE

## 2020-09-27 LAB — VALPROIC ACID LEVEL: Valproic Acid Lvl: <10 ug/mL — ABNORMAL LOW (ref 50.0–100.0)

## 2020-09-27 LAB — TROPONIN I (HIGH SENSITIVITY): Troponin I (High Sensitivity): 7 ng/L (ref <18)

## 2020-09-27 NOTE — ED Provider Notes (Signed)
FBC/OBS ASAP Discharge Summary  Date and Time: 09/27/2020 11:34 AM  Name: Cory Floyd  MRN:  [id number]   Discharge Diagnoses:  Final diagnoses:  Mood disorder, drug-induced (HCC)   Evaluation:Makarios E Lefkowitz was admitted for depression, without psychosis.  [place] was discharged from the local emergency department due to chest pain.  As reported by nursing staff patient is requesting to be discharged.  Presents slightly irritable and depressed.  Patient reports he is followed by [facility].  Denied suicidal or homicidal ideations.  Denies auditory visual hallucinations. Stated " I am just hungry."  CSW to provide additional outpatient resources.  Support encouragement reassurance was provided.  Pt was treated discharged with the medications listed below under Medication List.  Medical problems were identified and treated as needed.  Home medications were restarted as appropriate.  Improvement was monitored by observation and [name] 's daily report of symptom reduction.  Emotional and mental status was monitored by daily self-inventory reports completed by [name] and clinical staff.         JOHNSTON MADDOCKS was evaluated by the treatment team for stability and plans for continued recovery upon discharge. DURREL MCNEE 's motivation was an integral factor for scheduling further treatment. Employment, transportation, bed availability, health status, family support, and any pending legal issues were also considered during hospital stay. Pt was offered further treatment options upon discharge including but not limited to Residential, Intensive Outpatient, and Outpatient treatment.  PRESTIN MUNCH will follow up with the services as listed below under Follow Up Information.    [name]  demonstrated improvement without reported or observed adverse effects to the point of stability appropriate for outpatient management.  Total Time spent with patient: 15 minutes  Past  Psychiatric History:  Past Medical History:  Past Medical History:  Diagnosis Date  . 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 Anginal pain (HCC)   . Arthritis   . Bipolar 1 disorder (HCC)   . COVID-19   . Depressed   . Diabetes mellitus without complication (HCC)   . Dyspnea   . Fatty liver   . Hypertension   . Schizophrenia [facility])     Past Surgical History:  Procedure Laterality Date  . COLONOSCOPY  02/05/2014   diverticulosis, hyperplastic polyp x 2  . fracture arm Right   . KNEE SURGERY Right 2013  . NASAL SEPTUM SURGERY    . NASAL TURBINATE REDUCTION Bilateral 12/12/2016   Procedure: TURBINATE REDUCTION/SUBMUCOSAL RESECTION;  Surgeon: Linus Salmons, MD;  Location: [facility] ORS;  Service: ENT;  Laterality: Bilateral;   Family History:  Family History  Problem Relation Age of Onset  . Hepatitis C Mother   . Cancer Mother   . Cancer Maternal Aunt   . Hypertension Maternal Grandmother    Family Psychiatric History:  Social History:  Social History   Substance and Sexual Activity  Alcohol Use Yes   Comment: 1/2-5th pint of liquor     Social History   Substance and Sexual Activity  Drug Use Yes  . Types: Cocaine, Marijuana   Comment: heroin    Social History   Socioeconomic History  . Marital status: Divorced    Spouse name: Not on file  . Number of children: Not on file  . Years of education: Not on file  . Highest education level: Not on file  Occupational History  . Not on file  Tobacco Use  . Smoking  status: Current Every Day Smoker    Packs/day: 1.00    Years: 20.00    Pack years: 20.00    Types: Cigarettes  . Smokeless tobacco: Never Used  Vaping Use  . Vaping Use: Never used  Substance and Sexual Activity  . Alcohol use: Yes    Comment: 1/2-5th pint of liquor  . Drug use: Yes    Types: Cocaine, Marijuana    Comment: heroin  . Sexual activity: Yes    Birth control/protection: None  Other Topics Concern  . Not on file  Social History  Narrative  . Not on file   Social Determinants of Health   Financial Resource Strain:   . Difficulty of Paying Living Expenses: Not on file  Food Insecurity:   . Worried About Programme researcher, broadcasting/film/video in the Last Year: Not on file  . Ran Out of Food in the Last Year: Not on file  Transportation Needs:   . Lack of Transportation (Medical): Not on file  . Lack of Transportation (Non-Medical): Not on file  Physical Activity:   . Days of Exercise per Week: Not on file  . Minutes of Exercise per Session: Not on file  Stress:   . Feeling of Stress : Not on file  Social Connections:   . Frequency of Communication with Friends and Family: Not on file  . Frequency of Social Gatherings with Friends and Family: Not on file  . Attends Religious Services: Not on file  . Active Member of Clubs or Organizations: Not on file  . Attends Banker Meetings: Not on file  . Marital Status: Not on file   SDOH:  SDOH Screenings   Alcohol Screen: Medium Risk  . Last Alcohol Screening Score (AUDIT): 18  Depression (PHQ2-9):   . PHQ-2 Score: Not on file  Financial Resource Strain:   . Difficulty of Paying Living Expenses: Not on file  Food Insecurity:   . Worried About Programme researcher, broadcasting/film/video in the Last Year: Not on file  . Ran Out of Food in the Last Year: Not on file  Housing:   . Last Housing Risk Score: Not on file  Physical Activity:   . Days of Exercise per Week: Not on file  . Minutes of Exercise per Session: Not on file  Social Connections:   . Frequency of Communication with Friends and Family: Not on file  . Frequency of Social Gatherings with Friends and Family: Not on file  . Attends Religious Services: Not on file  . Active Member of Clubs or Organizations: Not on file  . Attends Banker Meetings: Not on file  . Marital Status: Not on file  Stress:   . Feeling of Stress : Not on file  Tobacco Use: High Risk  . Smoking Tobacco Use: Current Every Day Smoker  .  Smokeless Tobacco Use: Never Used  Transportation Needs:   . Freight forwarder (Medical): Not on file  . Lack of Transportation (Non-Medical): Not on file    Has this patient used any form of tobacco in the last 30 days? (Cigarettes, Smokeless Tobacco, Cigars, and/or Pipes) A prescription for an FDA-approved tobacco cessation medication was offered at discharge and the patient refused  Current Medications:  No current facility-administered medications for this encounter.   Current Outpatient Medications  Medication Sig Dispense Refill  . fluticasone (FLONASE) 50 MCG/ACT nasal spray Place 1 spray into both nostrils daily. 16 g 0  . sulfamethoxazole-trimethoprim (BACTRIM DS) 800-160  MG tablet Take 1 tablet by mouth 2 (two) times daily for 7 days. 14 tablet 0    PTA Medications: (Not in a hospital admission)   Musculoskeletal  Strength & Muscle Tone: within normal limits Gait & Station: normal Patient leans: N/A  Psychiatric Specialty Exam  Presentation  General Appearance: No data recorded Eye Contact:No data recorded Speech:No data recorded Speech Volume:No data recorded Handedness:No data recorded  Mood and Affect  Mood:No data recorded Affect:No data recorded  Thought Process  Thought Processes:No data recorded Descriptions of Associations:No data recorded Orientation:No data recorded Thought Content:No data recorded Hallucinations:No data recorded Ideas of Reference:No data recorded Suicidal Thoughts:No data recorded Homicidal Thoughts:No data recorded  Sensorium  Memory:No data recorded Judgment:No data recorded Insight:No data recorded  Executive Functions  Concentration:No data recorded Attention Span:No data recorded Recall:No data recorded Fund of Knowledge:No data recorded Language:No data recorded  Psychomotor Activity  Psychomotor Activity:No data recorded  Assets  Assets:No data recorded  Sleep  Sleep:No data recorded  Physical Exam   Physical Exam ROS Blood pressure 121/86, pulse 75, temperature 97.7 F (36.5 C), temperature source Oral, resp. rate 16, SpO2 100 %. There is no height or weight on file to calculate BMI.  Demographic Factors:  Low socioeconomic status  Loss Factors: Financial problems/change in socioeconomic status  Historical Factors: Family history of mental illness or substance abuse  Risk Reduction Factors:   NA  Continued Clinical Symptoms:  Alcohol/Substance Abuse/Dependencies  Cognitive Features That Contribute To Risk:  Closed-mindedness    Suicide Risk:  Minimal: No identifiable suicidal ideation.  Patients presenting with no risk factors but with morbid ruminations; may be classified as minimal risk based on the severity of the depressive symptoms  Plan Of Care/Follow-up recommendations:  Activity:  as tolerated Diet:  heart healthy  Disposition: Take all medications as prescribed. Keep all follow-up appointments as scheduled.  Do not consume alcohol or use illegal drugs while on prescription medications. Report any adverse effects from your medications to your primary care provider promptly.  In the event of recurrent symptoms or worsening symptoms, call 911, a crisis hotline, or go to the nearest emergency department for evaluation.   Oneta Rack, NP 09/27/2020, 11:34 AM

## 2020-09-27 NOTE — BH Assessment (Signed)
Tele Assessment Note   Patient Name: Cory Floyd MRN: [id number] Referring Physician: Milagros Evener, MD Location of Patient: [facility] ED, [facility]021[facility] Location of Provider: Behavioral Health TTS Department  Cory Floyd is an 44 y.o. male who presents unaccompanied to [facility] ED reporting chest pain and mental health symptoms. Pt was drowsy during assessment and had difficulty staying awake to answer questions. Pt has a diagnosis of schizoaffective disorder and a history of substance use. Pt says he is fearful that someone is following him and that people are trying to harm him. Per ED record, Pt ran from the lobby to the back hall, and stated, "they are going to get me, please dont make me go back to the lobby." Pt says, "Entities are having conversations with me." He reports he is having suicidal ideation with no specific plan or intent. He denies current homicidal ideation or history of aggression. Pt denies substance use but told the EDP he was using methamphetamines, cocaine and alcohol. Pt's labs are still in process.  Pt states he is currently homeless. He cannot identify any family or friends who are supportive. Pt appears to be on disability. Pt's medical record indicates Pt is divorced. He has two daughters ages 74 and 59. Pt denies legal problems. He denies access to firearms. Pt states he has no outpatient mental health providers. He has been psychiatrically hospitalized several times at different facilities.  Pt appears disheveled, alert and oriented x4. Pt speaks in a mumbled tone, at low volume and normal pace. Motor behavior appears normal. Eye contact is minimal. Pt's mood is anxious and affect is congruent with mood. Thought process is coherent. Pt appears to be responding to internal stimuli and at times was mumbling to someone not in the room.   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 Anginal pain (HCC)   . Arthritis   . Bipolar 1 disorder (HCC)   . COVID-19   . Depressed   . Diabetes mellitus without complication (HCC)   . Dyspnea   . Fatty liver   . Hypertension   . Schizophrenia [facility])     Past Surgical History:  Procedure Laterality Date  . COLONOSCOPY  02/05/2014   diverticulosis, hyperplastic polyp x 2  . fracture arm Right   . KNEE SURGERY Right 2013  . NASAL SEPTUM SURGERY    . NASAL TURBINATE REDUCTION Bilateral 12/12/2016   Procedure: TURBINATE REDUCTION/SUBMUCOSAL RESECTION;  Surgeon: Linus Salmons, MD;  Location: [facility] ORS;  Service: ENT;  Laterality: Bilateral;    Family History:  Family History  Problem Relation Age of Onset  . Hepatitis C Mother   . Cancer Mother   . Cancer Maternal Aunt   . Hypertension Maternal Grandmother     Social History:  reports that he has been smoking cigarettes. He has a 20.00 pack-year smoking history. He has never used smokeless tobacco. He reports current alcohol use. He reports current drug use. Drugs: Cocaine and Marijuana.  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, Legal, Work / School Substance #1 Name of Substance 1:  Methamphetamines 1 - Age of First Use: unknown 1 - Amount (size/oz): unknown 1 - Frequency: unknown 1 - Duration: unknown 1 - Last Use / Amount: unknown Substance #2 Name of Substance 2: Marijuana 2 - Age of First Use: 10 2 - Amount (size/oz): occasional use 2 - Frequency: occasional use 2 - Duration: ongoing 2 - Last Use / Amount: unknown Substance #3 Name of Substance 3: Alcohol 3 - Age of First Use:  unknown 3 - Amount (size/oz): unknown 3 - Frequency: unknown 3 - Duration: unknown 3 - Last Use / Amount: unknown  CIWA: CIWA-Ar BP: 114/60 Pulse Rate: 99 COWS:    Allergies:  Allergies  Allergen Reactions  . Carrot [Daucus Carota] Anaphylaxis and Rash  . Penicillins Anaphylaxis and Hives    Has patient had a PCN reaction causing immediate rash, facial/tongue/throat swelling, SOB or lightheadedness with hypotension: Yes Has patient had a PCN reaction causing severe rash involving mucus membranes or skin necrosis: No Has patient had a PCN reaction that required hospitalization Yes Has patient had a PCN reaction occurring within the last 10 years: No If all of the above answers are "NO", then may proceed with Cephalosporin use.     Home Medications: (Not in a hospital admission)   OB/GYN Status:  No LMP for male patient.  General Assessment Data Location of Assessment: [facility] ED TTS Assessment: In system Is this a Tele or Face-to-Face Assessment?: Tele Assessment Is this an Initial Assessment or a Re-assessment for this encounter?: Initial Assessment Patient Accompanied by:: N/A Language Other than English: No Living Arrangements: Homeless/Shelter What gender do you identify as?: Male Date Telepsych consult ordered in [facility]: 09/26/20 Time Telepsych consult ordered in [facility]: 2142 Marital status: Single Maiden name: NA Pregnancy Status: No Living Arrangements: Alone Can pt return to current living arrangement?: Yes Admission Status: Voluntary Is patient capable of signing voluntary admission?: Yes Referral Source: Self/Family/Friend Insurance type: Medicare     Crisis Care Plan Living Arrangements: Alone Legal Guardian: Other: (None) Name of Psychiatrist: None Name of Therapist: None  Education Status Is patient currently in school?: No Is the patient employed, unemployed or receiving disability?: Receiving disability income  Risk to self with the past 6  months Suicidal Ideation: Yes-Currently Present Has patient been a risk to self within the past 6 months prior to admission? : No Suicidal Intent: No Has patient had any suicidal intent within the past 6 months prior to admission? : No Is patient at risk for suicide?: No Suicidal Plan?: No Has patient had any suicidal plan within the past 6 months prior to admission? : No Access to Means: No What has been your use of drugs/alcohol within the last 12 months?: Pt reports using methamphetamines, alcohol and cocaine Previous Attempts/Gestures: Yes How many times?: 3 Other Self Harm Risks: None Triggers for Past Attempts: Hallucinations Intentional Self Injurious Behavior: None Family Suicide History: Unknown Recent stressful life event(s): Other (Comment) (unknown) Persecutory voices/beliefs?: Yes Depression: Yes Depression Symptoms: Insomnia, Feeling worthless/self pity, Loss of interest in usual pleasures, Fatigue, Isolating Substance abuse history and/or treatment for substance abuse?: Yes Suicide prevention information given to non-admitted patients: Not applicable  Risk to Others within the past 6 months Homicidal Ideation: No Does patient have any lifetime risk of violence toward others beyond the six months prior to admission? : No Thoughts of Harm to Others: No Current Homicidal Intent: No Current Homicidal Plan: No Access to Homicidal Means: No Identified Victim: None History of harm to others?: No  Assessment of Violence: None Noted Violent Behavior Description: Pt denies history of violence Does patient have access to weapons?: No Criminal Charges Pending?: No Does patient have a court date: No Is patient on probation?: No  Psychosis Hallucinations: Auditory Delusions: Persecutory  Mental Status Report Appearance/Hygiene: Disheveled Eye Contact: Poor Motor Activity: Freedom of movement Speech: Other (Comment) (Mumbling) Level of Consciousness: Drowsy Mood:  Anxious Affect: Anxious Anxiety Level: Severe Thought Processes: Coherent Judgement: Impaired Orientation: Person, Place, Time, Situation Obsessive Compulsive Thoughts/Behaviors: None  Cognitive Functioning Concentration: Decreased Memory: Recent Intact, Remote Intact Is patient IDD: No Insight: Poor Impulse Control: Poor Appetite: Fair Have you had any weight changes? : No Change Sleep: Decreased Total Hours of Sleep: 5 Vegetative Symptoms: None  ADLScreening [facility] Assessment Services) Patient's cognitive ability adequate to safely complete daily activities?: Yes Patient able to express need for assistance with ADLs?: Yes Independently performs ADLs?: Yes (appropriate for developmental age)  Prior Inpatient Therapy Prior Inpatient Therapy: Yes Prior Therapy Dates: 02/2020, multiple admits Prior Therapy Facilty/Provider(s): [facility], [facility], other facilities Reason for Treatment: Schizoaffective disorder  Prior Outpatient Therapy Prior Outpatient Therapy: Yes Prior Therapy Dates: unknown Prior Therapy Facilty/Provider(s): unknown Reason for Treatment: schizoaffective disorder Does patient have an ACCT team?: No Does patient have Intensive In-House Services?  : No Does patient have [facility] services? : No Does patient have P4CC services?: No  ADL Screening (condition at time of admission) Patient's cognitive ability adequate to safely complete daily activities?: Yes Is the patient deaf or have difficulty hearing?: No Does the patient have difficulty seeing, even when wearing glasses/contacts?: No Does the patient have difficulty concentrating, remembering, or making decisions?: No Patient able to express need for assistance with ADLs?: Yes Does the patient have difficulty dressing or bathing?: No Independently performs ADLs?: Yes (appropriate for developmental age) Does the patient have difficulty walking or climbing stairs?: No Weakness of Legs: None Weakness of  Arms/Hands: None  Home Assistive Devices/Equipment Home Assistive Devices/Equipment: None    Abuse/Neglect Assessment (Assessment to be complete while patient is alone) Abuse/Neglect Assessment Can Be Completed: Unable to assess, patient is non-responsive or altered mental status     Advance Directives (For Healthcare) Does Patient Have a Medical Advance Directive?: No Would patient like information on creating a medical advance directive?: No - Patient declined          Disposition: Gave clinical report to Otila Back, [name] who recommends Pt be transferred to [facility] for continuous assessment. Notified Jacalyn Lefevre, MD and Louanne Belton, RN of recommendation. Notified [facility] staff of pending transfer.  Disposition Initial Assessment Completed for this Encounter: Yes  This service was provided via telemedicine using a 2-way, interactive audio and video technology.  Names of all persons participating in this telemedicine service and their role in this encounter. Name: Cory Floyd Role: Patient  Name: Shela Commons, [facility] Role: TTS counselor         Harlin Rain Patsy [place], [facility], [facility] Triage Specialist [phone]  Pamalee Leyden 09/27/2020 2:06 AM

## 2020-09-27 NOTE — ED Notes (Signed)
Given milk

## 2020-09-27 NOTE — ED Notes (Signed)
Pt given meal

## 2020-09-27 NOTE — ED Notes (Signed)
Discharge instructions discussed and personal belongings returned. Pt escorted to front lobby. Safety maintained. Bus passes given to Pt.

## 2020-09-27 NOTE — ED Notes (Signed)
Pt updated on plan of care. Informed that he is going to be transferred to the [facility]. Pt denies needing to contact family to inform them of transfer.

## 2020-09-27 NOTE — ED Notes (Signed)
Pt given yogurt and nutrigrain bar

## 2020-09-27 NOTE — Progress Notes (Signed)
Received patient from the [facility], angry related to being here instead of [facility]. He was cooperative with the skin assessment, but got angry when the selection of food was not to his liking. He signed out AMA, but agreed to wait to talk with the provider.

## 2020-09-27 NOTE — Discharge Instructions (Signed)
Take all medications as prescribed. Keep all follow-up appointments as scheduled.  Do not consume alcohol or use illegal drugs while on prescription medications. Report any adverse effects from your medications to your primary care provider promptly.  In the event of recurrent symptoms or worsening symptoms, call 911, a crisis hotline, or go to the nearest emergency department for evaluation.   

## 2020-10-13 ENCOUNTER — Telehealth (HOSPITAL_COMMUNITY): Payer: Self-pay

## 2020-10-13 NOTE — Telephone Encounter (Signed)
Care Management - Follow Up [name] Discharges   Writer made contact with the patient.  Patient reports that he is now receivnig services with [facility].

## 2020-10-21 ENCOUNTER — Emergency Department (HOSPITAL_COMMUNITY)
Admission: EM | Admit: 2020-10-21 | Discharge: 2020-10-22 | Disposition: A | Discharge status: Home / Self Care | Payer: Medicare Other | Attending: Emergency Medicine | Admitting: Emergency Medicine

## 2020-10-21 ENCOUNTER — Encounter (HOSPITAL_COMMUNITY): Payer: Self-pay | Admitting: Emergency Medicine

## 2020-10-21 ENCOUNTER — Other Ambulatory Visit: Payer: Self-pay

## 2020-10-21 ENCOUNTER — Ambulatory Visit (HOSPITAL_COMMUNITY)
Admission: EM | Admit: 2020-10-21 | Discharge: 2020-10-21 | Disposition: A | Discharge status: Other Acute Inpatient Hospital | Payer: Medicare Other | Attending: Psychiatric/Mental Health | Admitting: Psychiatric/Mental Health

## 2020-10-21 ENCOUNTER — Emergency Department (HOSPITAL_COMMUNITY)
Admission: EM | Admit: 2020-10-21 | Discharge: 2020-10-21 | Disposition: A | Discharge status: Other Acute Inpatient Hospital | Payer: Medicare Other | Attending: Emergency Medicine | Admitting: Emergency Medicine

## 2020-10-21 ENCOUNTER — Emergency Department (HOSPITAL_COMMUNITY): Payer: Medicare Other

## 2020-10-21 DIAGNOSIS — F3130 Bipolar disorder, current episode depressed, mild or moderate severity, unspecified: Secondary | ICD-10-CM | POA: Diagnosis not present

## 2020-10-21 DIAGNOSIS — M25561 Pain in right knee: Secondary | ICD-10-CM | POA: Diagnosis not present

## 2020-10-21 DIAGNOSIS — F1924 Other psychoactive substance dependence with psychoactive substance-induced mood disorder: Secondary | ICD-10-CM | POA: Insufficient documentation

## 2020-10-21 DIAGNOSIS — Z8616 Personal history of COVID-19: Secondary | ICD-10-CM | POA: Insufficient documentation

## 2020-10-21 DIAGNOSIS — F1590 Other stimulant use, unspecified, uncomplicated: Secondary | ICD-10-CM | POA: Diagnosis not present

## 2020-10-21 DIAGNOSIS — F1290 Cannabis use, unspecified, uncomplicated: Secondary | ICD-10-CM | POA: Diagnosis not present

## 2020-10-21 DIAGNOSIS — F17210 Nicotine dependence, cigarettes, uncomplicated: Secondary | ICD-10-CM | POA: Diagnosis not present

## 2020-10-21 DIAGNOSIS — R441 Visual hallucinations: Secondary | ICD-10-CM | POA: Insufficient documentation

## 2020-10-21 DIAGNOSIS — R454 Irritability and anger: Secondary | ICD-10-CM | POA: Diagnosis not present

## 2020-10-21 DIAGNOSIS — M545 Low back pain, unspecified: Secondary | ICD-10-CM | POA: Insufficient documentation

## 2020-10-21 DIAGNOSIS — F600 Paranoid personality disorder: Secondary | ICD-10-CM | POA: Diagnosis not present

## 2020-10-21 DIAGNOSIS — F333 Major depressive disorder, recurrent, severe with psychotic symptoms: Secondary | ICD-10-CM | POA: Insufficient documentation

## 2020-10-21 DIAGNOSIS — R440 Auditory hallucinations: Secondary | ICD-10-CM | POA: Insufficient documentation

## 2020-10-21 DIAGNOSIS — F1994 Other psychoactive substance use, unspecified with psychoactive substance-induced mood disorder: Secondary | ICD-10-CM | POA: Insufficient documentation

## 2020-10-21 DIAGNOSIS — G4700 Insomnia, unspecified: Secondary | ICD-10-CM | POA: Diagnosis not present

## 2020-10-21 DIAGNOSIS — Z20822 Contact with and (suspected) exposure to covid-19: Secondary | ICD-10-CM | POA: Insufficient documentation

## 2020-10-21 DIAGNOSIS — E119 Type 2 diabetes mellitus without complications: Secondary | ICD-10-CM | POA: Insufficient documentation

## 2020-10-21 DIAGNOSIS — U071 COVID-19: Secondary | ICD-10-CM

## 2020-10-21 DIAGNOSIS — F1490 Cocaine use, unspecified, uncomplicated: Secondary | ICD-10-CM | POA: Insufficient documentation

## 2020-10-21 DIAGNOSIS — Z046 Encounter for general psychiatric examination, requested by authority: Secondary | ICD-10-CM | POA: Insufficient documentation

## 2020-10-21 DIAGNOSIS — F1920 Other psychoactive substance dependence, uncomplicated: Secondary | ICD-10-CM | POA: Insufficient documentation

## 2020-10-21 DIAGNOSIS — Z79899 Other long term (current) drug therapy: Secondary | ICD-10-CM | POA: Insufficient documentation

## 2020-10-21 DIAGNOSIS — R45851 Suicidal ideations: Secondary | ICD-10-CM | POA: Insufficient documentation

## 2020-10-21 DIAGNOSIS — I10 Essential (primary) hypertension: Secondary | ICD-10-CM | POA: Insufficient documentation

## 2020-10-21 DIAGNOSIS — Z7289 Other problems related to lifestyle: Secondary | ICD-10-CM | POA: Insufficient documentation

## 2020-10-21 DIAGNOSIS — R443 Hallucinations, unspecified: Secondary | ICD-10-CM

## 2020-10-21 LAB — CBC
HCT: 38.8 % — ABNORMAL LOW (ref 39.0–52.0)
Hemoglobin: 13.1 g/dL (ref 13.0–17.0)
MCH: 31 pg (ref 26.0–34.0)
MCHC: 33.8 g/dL (ref 30.0–36.0)
MCV: 91.9 fL (ref 80.0–100.0)
Platelets: 177 10*3/uL (ref 150–400)
RBC: 4.22 MIL/uL (ref 4.22–5.81)
RDW: 13.2 % (ref 11.5–15.5)
WBC: 5.4 10*3/uL (ref 4.0–10.5)
nRBC: 0 % (ref 0.0–0.2)

## 2020-10-21 LAB — RAPID URINE DRUG SCREEN, HOSP PERFORMED
Amphetamines: NOT DETECTED
Barbiturates: NOT DETECTED
Benzodiazepines: NOT DETECTED
Cocaine: POSITIVE — AB
Opiates: NOT DETECTED
Tetrahydrocannabinol: NOT DETECTED

## 2020-10-21 LAB — COMPREHENSIVE METABOLIC PANEL
ALT: 24 U/L (ref 0–44)
AST: 29 U/L (ref 15–41)
Albumin: 4.3 g/dL (ref 3.5–5.0)
Alkaline Phosphatase: 66 U/L (ref 38–126)
Anion gap: 13 (ref 5–15)
BUN: 16 mg/dL (ref 6–20)
CO2: 21 mmol/L — ABNORMAL LOW (ref 22–32)
Calcium: 9.1 mg/dL (ref 8.9–10.3)
Chloride: 102 mmol/L (ref 98–111)
Creatinine, Ser: 1.16 mg/dL (ref 0.61–1.24)
GFR, Estimated: >60 mL/min (ref >60)
Glucose, Bld: 179 mg/dL — ABNORMAL HIGH (ref 70–99)
Potassium: 3.6 mmol/L (ref 3.5–5.1)
Sodium: 136 mmol/L (ref 135–145)
Total Bilirubin: 0.8 mg/dL (ref 0.3–1.2)
Total Protein: 7.5 g/dL (ref 6.5–8.1)

## 2020-10-21 LAB — RESP PANEL BY RT-PCR (FLU A&B, COVID) ARPGX2
Influenza A by PCR: NEGATIVE
Influenza B by PCR: NEGATIVE
SARS Coronavirus 2 by RT PCR: POSITIVE — AB

## 2020-10-21 LAB — ETHANOL: Alcohol, Ethyl (B): <10 mg/dL (ref <10)

## 2020-10-21 LAB — SALICYLATE LEVEL: Salicylate Lvl: <7 mg/dL — ABNORMAL LOW (ref 7.0–30.0)

## 2020-10-21 LAB — ACETAMINOPHEN LEVEL: Acetaminophen (Tylenol), Serum: <10 ug/mL — ABNORMAL LOW (ref 10–30)

## 2020-10-21 MED ORDER — MIRTAZAPINE 15 MG PO TABS
15.0000 mg | ORAL_TABLET | Freq: Every day | ORAL | Status: DC
  Administered 2020-10-21: 23:00:00 15 mg via ORAL
  Filled 2020-10-21 (×2): qty 1

## 2020-10-21 MED ORDER — DIVALPROEX SODIUM 250 MG PO DR TAB: 250.0000 mg | DELAYED_RELEASE_TABLET | Freq: Every morning | ORAL | Status: DC

## 2020-10-21 MED ORDER — ZIPRASIDONE MESYLATE 20 MG IM SOLR: 20.0000 mg | INTRAMUSCULAR | Status: DC | PRN

## 2020-10-21 MED ORDER — LOPERAMIDE HCL 2 MG PO CAPS: 2.0000 mg | ORAL_CAPSULE | ORAL | Status: DC | PRN

## 2020-10-21 MED ORDER — RISPERIDONE 2 MG PO TBDP: 2.0000 mg | ORAL_TABLET | Freq: Three times a day (TID) | ORAL | Status: DC | PRN

## 2020-10-21 MED ORDER — ALUM & MAG HYDROXIDE-SIMETH 200-200-20 MG/5ML PO SUSP: 30.0000 mL | ORAL | Status: DC | PRN

## 2020-10-21 MED ORDER — TRAZODONE HCL 100 MG PO TABS: 100.0000 mg | ORAL_TABLET | Freq: Every evening | ORAL | Status: DC | PRN

## 2020-10-21 MED ORDER — LORAZEPAM 1 MG PO TABS: 1.0000 mg | ORAL_TABLET | ORAL | Status: DC | PRN

## 2020-10-21 MED ORDER — ADULT MULTIVITAMIN W/MINERALS CH: 1.0000 | ORAL_TABLET | Freq: Every day | ORAL | Status: DC

## 2020-10-21 MED ORDER — DIVALPROEX SODIUM 250 MG PO DR TAB
250.0000 mg | DELAYED_RELEASE_TABLET | Freq: Every morning | ORAL | Status: DC
  Administered 2020-10-22: 250 mg via ORAL
  Filled 2020-10-21: qty 1

## 2020-10-21 MED ORDER — PROPRANOLOL HCL 10 MG PO TABS
10.0000 mg | ORAL_TABLET | Freq: Two times a day (BID) | ORAL | Status: DC
  Administered 2020-10-21 – 2020-10-22 (×2): 10 mg via ORAL
  Filled 2020-10-21 (×3): qty 1

## 2020-10-21 MED ORDER — PROPRANOLOL HCL 10 MG PO TABS: 10.0000 mg | ORAL_TABLET | Freq: Two times a day (BID) | ORAL | Status: DC

## 2020-10-21 MED ORDER — ACETAMINOPHEN 325 MG PO TABS: 650.0000 mg | ORAL_TABLET | Freq: Four times a day (QID) | ORAL | Status: DC | PRN

## 2020-10-21 MED ORDER — MAGNESIUM HYDROXIDE 400 MG/5ML PO SUSP: 30.0000 mL | Freq: Every day | ORAL | Status: DC | PRN

## 2020-10-21 MED ORDER — RISPERIDONE 1 MG PO TABS
1.0000 mg | ORAL_TABLET | Freq: Every day | ORAL | Status: DC
  Administered 2020-10-21: 23:00:00 1 mg via ORAL
  Filled 2020-10-21 (×2): qty 1

## 2020-10-21 MED ORDER — RISPERIDONE 2 MG PO TABS: 2.0000 mg | ORAL_TABLET | Freq: Every day | ORAL | Status: DC

## 2020-10-21 MED ORDER — THIAMINE HCL 100 MG PO TABS: 100.0000 mg | ORAL_TABLET | Freq: Every day | ORAL | Status: DC

## 2020-10-21 MED ORDER — DIVALPROEX SODIUM 500 MG PO DR TAB: 500.0000 mg | DELAYED_RELEASE_TABLET | Freq: Every day | ORAL | Status: DC

## 2020-10-21 MED ORDER — ONDANSETRON 4 MG PO TBDP: 4.0000 mg | ORAL_TABLET | Freq: Four times a day (QID) | ORAL | Status: DC | PRN

## 2020-10-21 MED ORDER — TRAZODONE HCL 50 MG PO TABS: 50.0000 mg | ORAL_TABLET | Freq: Every evening | ORAL | Status: DC | PRN

## 2020-10-21 MED ORDER — OLANZAPINE 10 MG PO TBDP: 10.0000 mg | ORAL_TABLET | Freq: Three times a day (TID) | ORAL | Status: DC | PRN

## 2020-10-21 MED ORDER — DIVALPROEX SODIUM 250 MG PO DR TAB
500.0000 mg | DELAYED_RELEASE_TABLET | Freq: Every day | ORAL | Status: DC
  Administered 2020-10-21: 23:00:00 500 mg via ORAL
  Filled 2020-10-21: qty 2

## 2020-10-21 MED ORDER — RISPERIDONE 1 MG PO TABS: 1.0000 mg | ORAL_TABLET | Freq: Every day | ORAL | Status: DC

## 2020-10-21 MED ORDER — HYDROXYZINE HCL 25 MG PO TABS: 25.0000 mg | ORAL_TABLET | Freq: Four times a day (QID) | ORAL | Status: DC | PRN

## 2020-10-21 MED ORDER — MIRTAZAPINE 15 MG PO TABS: 15.0000 mg | ORAL_TABLET | Freq: Every day | ORAL | Status: DC

## 2020-10-21 MED ORDER — LORAZEPAM 1 MG PO TABS: 1.0000 mg | ORAL_TABLET | Freq: Four times a day (QID) | ORAL | Status: DC | PRN

## 2020-10-21 NOTE — ED Notes (Signed)
[facility] transport at bedside to transport pt to [facility].

## 2020-10-21 NOTE — ED Triage Notes (Signed)
Pt reports hearing voices after drinking a lot of alcohol and smoking crack last night. States he feels paranoid like people are following him. Has not been on psych meds for a little while. When asked about SI pt just states "I dont liike being like this"

## 2020-10-21 NOTE — ED Triage Notes (Signed)
Pt seen & DC to [facility] earlier this evening, returns via [facility] due to Covid+ test result. Pt denies SI/HI currently, states he would like "hot meal."

## 2020-10-21 NOTE — ED Provider Notes (Signed)
FBC/OBS ASAP Discharge Summary  Date and Time: 10/21/2020 8:30 PM  Name: Cory Floyd  MRN:  [id number]   Discharge Diagnoses:  Final diagnoses:  Substance induced mood disorder (HCC)   Once patient arrived at [facility], COVID test was done and came back Positive. Unfortunately, patient is not appropriate for [facility] continuous assessment due to COVID test result. Patient will be transferred to [facility] ED. Provider Handoff given by Berneice Heinrich, FNP to Dr. Lockie Mola and Dr. Lockie Mola has agreed to accept the patient. [facility] Nursing report given to [facility] ED Nursing Staff. EMTALA Form completed.   Per Berneice Heinrich, FNP note on 10/21/20:   "HPI: Patient presents voluntarily to [facility] behavioral health center, transported from [facility] emergency department.  Patient assessed by nurse practitioner.  Patient alert and oriented, irritable upon arrival.  Patient reports frustration that he is here at the [facility] behavioral health center versus at [facility].  Patient reports that he does not want to be discharged after only 23 hours.   Patient endorses passive suicidal ideations, denies plan or intent at this time.  Patient denies homicidal ideations.  Patient denies both auditory and visual hallucinations at this time, reports auditory hallucinations earlier this day.  Patient request that act team, [facility] be contacted to coordinate care.  Patient verbalizes agreement with current plan to remain at [facility] behavioral health observation for tonight.  Patient assessed by psychiatry provider in the emergency department earlier this shift: Assessment note:Cory Floyd is a 44 year old male with history of polysubstance use disorder who presented to [facility] today reporting AH and paranoia after consuming alcohol and smoking crack last night. On assessment he appears irritable and states he came to the hospital because "I'm going crazy. Paranoid." He reports  running out of his medications three weeks ago. He reports SI "to do something quick and painless" and not contracting for safety. Reports depressed/irritable mood with anhedonia, insomnia, fatigue, and hopelessness. He reports AH telling him that people are after him. He has been staying in a transitional living house through his employer but reports high drug use there which has contributed to his mood instability and drug use. He admits to increased crack use since living there. He also reports occasional alcohol use but denies daily alcohol use. UDS positive for cocaine, BAL negative. He reports previously taking Depakote ER 1500 mg, Abilify 15 mg daily, 10 mg propranolol, unknown dose of Remeron, and trazodone 200 mg QHS from a provider in [facility]. He states he is now a patient of [facility] but has not been started on medications by them. He states medications were helpful for mood instability but had persistent hallucinations. Per chart multiple prior drug screens positive for cocaine. He denies HI.  Disposition:Recommend overnight observationat [facility] restart medications and allow time for drugs to clear from system. Discussed with patient restarting Depakote, Remeron, propranolol and trazodone. Patient denies prior trials of Risperdal and agreeable to starting this medication due to reports of continued AVH on Abilify. Called and spoke with [facility] provider. ED staff updated."   Past Medical History:  Past Medical History:  Diagnosis Date  . 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 Anginal pain (HCC)   . Arthritis   . Bipolar 1 disorder (HCC)   . COVID-19   . Depressed   . Diabetes mellitus without complication (HCC)   . Dyspnea   . Fatty liver   . Hypertension   .  Schizophrenia [facility])     Past Surgical History:  Procedure Laterality Date  . COLONOSCOPY  02/05/2014   diverticulosis, hyperplastic polyp x 2  . fracture arm Right   . KNEE SURGERY Right 2013  .  NASAL SEPTUM SURGERY    . NASAL TURBINATE REDUCTION Bilateral 12/12/2016   Procedure: TURBINATE REDUCTION/SUBMUCOSAL RESECTION;  Surgeon: Linus Salmons, MD;  Location: [facility] ORS;  Service: ENT;  Laterality: Bilateral;   Family History:  Family History  Problem Relation Age of Onset  . Hepatitis C Mother   . Cancer Mother   . Cancer Maternal Aunt   . Hypertension Maternal Grandmother    Social History:  Social History   Substance and Sexual Activity  Alcohol Use Yes   Comment: 1/2-5th pint of liquor     Social History   Substance and Sexual Activity  Drug Use Yes  . Types: Cocaine, Marijuana   Comment: heroin    Social History   Socioeconomic History  . Marital status: Divorced    Spouse name: Not on file  . Number of children: Not on file  . Years of education: Not on file  . Highest education level: Not on file  Occupational History  . Not on file  Tobacco Use  . Smoking status: Current Every Day Smoker    Packs/day: 1.00    Years: 20.00    Pack years: 20.00    Types: Cigarettes  . Smokeless tobacco: Never Used  Vaping Use  . Vaping Use: Never used  Substance and Sexual Activity  . Alcohol use: Yes    Comment: 1/2-5th pint of liquor  . Drug use: Yes    Types: Cocaine, Marijuana    Comment: heroin  . Sexual activity: Yes    Birth control/protection: None  Other Topics Concern  . Not on file  Social History Narrative  . Not on file   Social Determinants of Health   Financial Resource Strain: Not on file  Food Insecurity: Not on file  Transportation Needs: Not on file  Physical Activity: Not on file  Stress: Not on file  Social Connections: Not on file   SDOH:  SDOH Screenings   Alcohol Screen: Medium Risk  . Last Alcohol Screening Score (AUDIT): 18  Depression (PHQ2-9): Not on file  Financial Resource Strain: Not on file  Food Insecurity: Not on file  Housing: Not on file  Physical Activity: Not on file  Social Connections: Not on file   Stress: Not on file  Tobacco Use: High Risk  . Smoking Tobacco Use: Current Every Day Smoker  . Smokeless Tobacco Use: Never Used  Transportation Needs: Not on file    Current Medications:  Current Facility-Administered Medications  Medication Dose Route Frequency Provider Last Rate Last Admin  . acetaminophen (TYLENOL) tablet 650 mg  650 mg Oral Q6H PRN Aldean Baker, NP      . alum & mag hydroxide-simeth (MAALOX/MYLANTA) 200-200-20 MG/5ML suspension 30 mL  30 mL Oral Q4H PRN Aldean Baker, NP      . Melene Muller ON 10/22/2020] divalproex (DEPAKOTE) DR tablet 250 mg  250 mg Oral q morning - 10a Aldean Baker, NP      . divalproex (DEPAKOTE) DR tablet 500 mg  500 mg Oral QHS Aldean Baker, NP      . hydrOXYzine (ATARAX/VISTARIL) tablet 25 mg  25 mg Oral Q6H PRN Aldean Baker, NP      . loperamide (IMODIUM) capsule 2-4 mg  2-4 mg Oral  PRN Aldean Baker, NP      . LORazepam (ATIVAN) tablet 1 mg  1 mg Oral Q6H PRN Aldean Baker, NP      . risperiDONE (RISPERDAL M-TABS) disintegrating tablet 2 mg  2 mg Oral Q8H PRN Aldean Baker, NP       And  . LORazepam (ATIVAN) tablet 1 mg  1 mg Oral PRN Aldean Baker, NP       And  . ziprasidone (GEODON) injection 20 mg  20 mg Intramuscular PRN Aldean Baker, NP      . magnesium hydroxide (MILK OF MAGNESIA) suspension 30 mL  30 mL Oral Daily PRN Aldean Baker, NP      . mirtazapine (REMERON) tablet 15 mg  15 mg Oral QHS Aldean Baker, NP      . multivitamin with minerals tablet 1 tablet  1 tablet Oral Daily Aldean Baker, NP      . ondansetron (ZOFRAN-ODT) disintegrating tablet 4 mg  4 mg Oral Q6H PRN Aldean Baker, NP      . propranolol (INDERAL) tablet 10 mg  10 mg Oral BID Aldean Baker, NP      . risperiDONE (RISPERDAL) tablet 1 mg  1 mg Oral QHS Aldean Baker, NP      . risperiDONE (RISPERDAL) tablet 2 mg  2 mg Oral QHS Aldean Baker, NP      . Melene Muller ON 10/22/2020] thiamine tablet 100 mg  100 mg Oral Daily Aldean Baker, NP      .  traZODone (DESYREL) tablet 100 mg  100 mg Oral QHS PRN,MR X 1 Aldean Baker, NP       Current Outpatient Medications  Medication Sig Dispense Refill  . fluticasone (FLONASE) 50 MCG/ACT nasal spray Place 1 spray into both nostrils daily. 16 g 0    PTA Medications: (Not in a hospital admission)   Psychiatric Specialty Exam  Presentation  General Appearance: Appropriate for Environment; Casual  Eye Contact:Good  Speech:Clear and Coherent; Normal Rate  Speech Volume:Increased  Handedness:Right   Mood and Affect  Mood:Irritable  Affect:Labile   Thought Process  Thought Processes:Coherent; Goal Directed  Descriptions of Associations:Intact  Orientation:Full (Time, Place and Person)  Thought Content:Logical  Hallucinations:Hallucinations: None  Ideas of Reference:None  Suicidal Thoughts:Suicidal Thoughts: Yes, Passive SI Passive Intent and/or Plan: Without Intent; Without Plan  Homicidal Thoughts:Homicidal Thoughts: No   Sensorium  Memory:Immediate Good; Recent Good; Remote Good  Judgment:Fair  Insight:Fair   Executive Functions  Concentration:Good  Attention Span:Good  Recall:Good  Fund of Knowledge:Good  Language:Good   Psychomotor Activity  Psychomotor Activity:Psychomotor Activity: Normal   Assets  Assets:Communication Skills; Desire for Improvement; Financial Resources/Insurance; Leisure Time; Physical Health; Resilience; Social Support   Sleep  Sleep:Sleep: Fair  Disposition: Per Marciano Sequin, NP Note on 10/21/2020: "Restart medications and allow time for drugs to clear from system. Discussed with patient restarting Depakote, Remeron, propranolol and trazodone. Patient denies prior trials of Risperdal and agreeable to starting this medication due to reports of continued AVH on Abilify. Called and spoke with [facility] provider. ED staff updated."  Jaclyn Shaggy, [name] 10/21/2020, 8:30 PM

## 2020-10-21 NOTE — Discharge Instructions (Addendum)
Patient to be transferred to [facility] ED due to Positive COVID result.

## 2020-10-21 NOTE — ED Notes (Signed)
Pt speaking with TTS 

## 2020-10-21 NOTE — ED Provider Notes (Signed)
Behavioral Health Admission H&P [facility] & OBS)  Date: 10/21/20 Patient Name: Cory Floyd MRN: [id number] Chief Complaint: No chief complaint on file.     Diagnoses:  Final diagnoses:  Substance induced mood disorder (HCC)    HPI: Patient presents voluntarily to [facility] behavioral health center, transported from [facility] emergency department.  Patient assessed by nurse practitioner.  Patient alert and oriented, irritable upon arrival.  Patient reports frustration that he is here at the [facility] behavioral health center versus at [facility].  Patient reports that he does not want to be discharged after only 23 hours.   Patient endorses passive suicidal ideations, denies plan or intent at this time.  Patient denies homicidal ideations.  Patient denies both auditory and visual hallucinations at this time, reports auditory hallucinations earlier this day.  Patient request that act team, [facility] be contacted to coordinate care.  Patient verbalizes agreement with current plan to remain at [facility] behavioral health observation for tonight.  Patient assessed by psychiatry provider in the emergency department earlier this shift: Assessment note: Mr. Henk is a 44 year old male with history of polysubstance use disorder who presented to [facility] today reporting AH and paranoia after consuming alcohol and smoking crack last night. On assessment he appears irritable and states he came to the hospital because "I'm going crazy. Paranoid." He reports running out of his medications three weeks ago. He reports SI "to do something quick and painless" and not contracting for safety. Reports depressed/irritable mood with anhedonia, insomnia, fatigue, and hopelessness. He reports AH telling him that people are after him. He has been staying in a transitional living house through his employer but reports high drug use there which has contributed to his mood  instability and drug use. He admits to increased crack use since living there. He also reports occasional alcohol use but denies daily alcohol use. UDS positive for cocaine, BAL negative. He reports previously taking Depakote ER 1500 mg, Abilify 15 mg daily, 10 mg propranolol, unknown dose of Remeron, and trazodone 200 mg QHS from a provider in [facility]. He states he is now a patient of [facility] but has not been started on medications by them. He states medications were helpful for mood instability but had persistent hallucinations. Per chart multiple prior drug screens positive for cocaine. He denies HI.    Disposition: Recommend overnight observation at [facility] to restart medications and allow time for drugs to clear from system. Discussed with patient restarting Depakote, Remeron, propranolol and trazodone. Patient denies prior trials of Risperdal and agreeable to starting this medication due to reports of continued AVH on Abilify. Called and spoke with [facility] provider. ED staff updated.     PHQ 2-9:   Flowsheet Row ED from 10/21/2020 in [facility] EMERGENCY DEPARTMENT ED from 07/24/2020 in [facility] Admission (Discharged) from OP Visit from 03/04/2020 in BEHAVIORAL HEALTH OBSERVATION UNIT  C-SSRS RISK CATEGORY Error: Q2 is Yes, you must answer 3, 4, and 5 High Risk High Risk       Total Time spent with patient: 30 minutes  Musculoskeletal  Strength & Muscle Tone: within normal limits Gait & Station: normal Patient leans: N/A  Psychiatric Specialty Exam  Presentation General Appearance: Appropriate for Environment; Casual  Eye Contact:Good  Speech:Clear and Coherent; Normal Rate  Speech Volume:Increased  Handedness:Right   Mood and Affect  Mood:Irritable  Affect:Labile   Thought Process  Thought Processes:Coherent; Goal  Directed  Descriptions of Associations:Intact  Orientation:Full (Time, Place and  Person)  Thought Content:Logical  Hallucinations:Hallucinations: None  Ideas of Reference:None  Suicidal Thoughts:Suicidal Thoughts: Yes, Passive SI Passive Intent and/or Plan: Without Intent; Without Plan  Homicidal Thoughts:Homicidal Thoughts: No   Sensorium  Memory:Immediate Good; Recent Good; Remote Good  Judgment:Fair  Insight:Fair   Executive Functions  Concentration:Good  Attention Span:Good  Recall:Good  Fund of Knowledge:Good  Language:Good   Psychomotor Activity  Psychomotor Activity:Psychomotor Activity: Normal   Assets  Assets:Communication Skills; Desire for Improvement; Financial Resources/Insurance; Leisure Time; Physical Health; Resilience; Social Support   Sleep  Sleep:Sleep: Fair   Physical Exam Vitals and nursing note reviewed.  Constitutional:      Appearance: He is well-developed.  HENT:     Head: Normocephalic.  Cardiovascular:     Rate and Rhythm: Normal rate.  Pulmonary:     Effort: Pulmonary effort is normal.  Neurological:     Mental Status: He is alert and oriented to person, place, and time.  Psychiatric:        Attention and Perception: Attention and perception normal.        Mood and Affect: Mood is anxious. Affect is labile.        Speech: Speech normal.        Behavior: Behavior is cooperative.        Thought Content: Thought content includes suicidal ideation.        Cognition and Memory: Cognition and memory normal.        Judgment: Judgment normal.    Review of Systems  Constitutional: Negative.   HENT: Negative.   Eyes: Negative.   Respiratory: Negative.   Cardiovascular: Negative.   Gastrointestinal: Negative.   Genitourinary: Negative.   Musculoskeletal: Negative.   Skin: Negative.   Neurological: Negative.   Endo/Heme/Allergies: Negative.   Psychiatric/Behavioral: Positive for substance abuse and suicidal ideas.    There were no vitals taken for this visit. There is no height or weight on file to  calculate BMI.  Past Psychiatric History: Substance-induced mood disorder, bipolar affective disorder, suicidal ideation, polysubstance dependence  Is the patient at risk to self? No  Has the patient been a risk to self in the past 6 months? Yes .    Has the patient been a risk to self within the distant past? Yes   Is the patient a risk to others? No   Has the patient been a risk to others in the past 6 months? No   Has the patient been a risk to others within the distant past? No   Past Medical History:  Past Medical History:  Diagnosis Date  . 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 Anginal pain (HCC)   . Arthritis   . Bipolar 1 disorder (HCC)   . COVID-19   . Depressed   . Diabetes mellitus without complication (HCC)   . Dyspnea   . Fatty liver   . Hypertension   . Schizophrenia [facility])     Past Surgical History:  Procedure Laterality Date  . COLONOSCOPY  02/05/2014   diverticulosis, hyperplastic polyp x 2  . fracture arm Right   . KNEE SURGERY Right 2013  . NASAL SEPTUM SURGERY    . NASAL TURBINATE REDUCTION Bilateral 12/12/2016   Procedure: TURBINATE REDUCTION/SUBMUCOSAL RESECTION;  Surgeon: Linus Salmons, MD;  Location: [facility] ORS;  Service: ENT;  Laterality: Bilateral;    Family History:  Family History  Problem Relation Age of Onset  .  Hepatitis C Mother   . Cancer Mother   . Cancer Maternal Aunt   . Hypertension Maternal Grandmother     Social History:  Social History   Socioeconomic History  . Marital status: Divorced    Spouse name: Not on file  . Number of children: Not on file  . Years of education: Not on file  . Highest education level: Not on file  Occupational History  . Not on file  Tobacco Use  . Smoking status: Current Every Day Smoker    Packs/day: 1.00    Years: 20.00    Pack years: 20.00    Types: Cigarettes  . Smokeless tobacco: Never Used  Vaping Use  . Vaping Use: Never used  Substance and Sexual Activity  . Alcohol use: Yes     Comment: 1/2-5th pint of liquor  . Drug use: Yes    Types: Cocaine, Marijuana    Comment: heroin  . Sexual activity: Yes    Birth control/protection: None  Other Topics Concern  . Not on file  Social History Narrative  . Not on file   Social Determinants of Health   Financial Resource Strain: Not on file  Food Insecurity: Not on file  Transportation Needs: Not on file  Physical Activity: Not on file  Stress: Not on file  Social Connections: Not on file  Intimate Partner Violence: Not on file    SDOH:  SDOH Screenings   Alcohol Screen: Medium Risk  . Last Alcohol Screening Score (AUDIT): 18  Depression (PHQ2-9): Not on file  Financial Resource Strain: Not on file  Food Insecurity: Not on file  Housing: Not on file  Physical Activity: Not on file  Social Connections: Not on file  Stress: Not on file  Tobacco Use: High Risk  . Smoking Tobacco Use: Current Every Day Smoker  . Smokeless Tobacco Use: Never Used  Transportation Needs: Not on file    Last Labs:  Admission on 10/21/2020, Discharged on 10/21/2020  Component Date Value Ref Range Status  . Sodium 10/21/2020 136  135 - 145 mmol/L Final  . Potassium 10/21/2020 3.6  3.5 - 5.1 mmol/L Final  . Chloride 10/21/2020 102  98 - 111 mmol/L Final  . CO2 10/21/2020 21* 22 - 32 mmol/L Final  . Glucose, Bld 10/21/2020 179* 70 - 99 mg/dL Final   Glucose reference range applies only to samples taken after fasting for at least 8 hours.  . BUN 10/21/2020 16  6 - 20 mg/dL Final  . Creatinine, Ser 10/21/2020 1.16  0.61 - 1.24 mg/dL Final  . Calcium 62/69/4854 9.1  8.9 - 10.3 mg/dL Final  . Total Protein 10/21/2020 7.5  6.5 - 8.1 g/dL Final  . Albumin 62/70/3500 4.3  3.5 - 5.0 g/dL Final  . AST 93/81/8299 29  15 - 41 U/L Final  . ALT 10/21/2020 24  0 - 44 U/L Final  . Alkaline Phosphatase 10/21/2020 66  38 - 126 U/L Final  . Total Bilirubin 10/21/2020 0.8  0.3 - 1.2 mg/dL Final  . GFR, Estimated 10/21/2020 >60  >60 mL/min  Final   Comment: (NOTE) Calculated using the CKD-EPI Creatinine Equation (2021)   . Anion gap 10/21/2020 13  5 - 15 Final   Performed at [facility], [address], [place], [place] [address]  . Alcohol, Ethyl (B) 10/21/2020 <10  <10 mg/dL Final   Comment: (NOTE) Lowest detectable limit for serum alcohol is 10 mg/dL.  For medical purposes only. Performed  at [facility], [address], [place], [place] [address]   . Salicylate Lvl 10/21/2020 <7.0* 7.0 - 30.0 mg/dL Final   Performed at [facility], [address] [place], [place] [address]  . Acetaminophen (Tylenol), Serum 10/21/2020 <10* 10 - 30 ug/mL Final   Comment: (NOTE) Therapeutic concentrations vary significantly. A range of 10-30 ug/mL  may be an effective concentration for many patients. However, some  are best treated at concentrations outside of this range. Acetaminophen concentrations >150 ug/mL at 4 hours after ingestion  and >50 ug/mL at 12 hours after ingestion are often associated with  toxic reactions.  Performed at [facility], [address], [place], [place] [address]   . WBC 10/21/2020 5.4  4.0 - 10.5 K/uL Final  . RBC 10/21/2020 4.22  4.22 - 5.81 MIL/uL Final  . Hemoglobin 10/21/2020 13.1  13.0 - 17.0 g/dL Final  . HCT 28/76/8115 38.8* 39.0 - 52.0 % Final  . MCV 10/21/2020 91.9  80.0 - 100.0 fL Final  . MCH 10/21/2020 31.0  26.0 - 34.0 pg Final  . MCHC 10/21/2020 33.8  30.0 - 36.0 g/dL Final  . RDW 72/62/0355 13.2  11.5 - 15.5 % Final  . Platelets 10/21/2020 177  150 - 400 K/uL Final  . nRBC 10/21/2020 0.0  0.0 - 0.2 % Final   Performed at [facility], [address], [place], [place] [address]  . Opiates 10/21/2020 NONE DETECTED  NONE DETECTED Final  . Cocaine 10/21/2020 POSITIVE* NONE DETECTED Final  . Benzodiazepines 10/21/2020 NONE DETECTED  NONE DETECTED Final  . Amphetamines 10/21/2020 NONE DETECTED  NONE DETECTED Final  . Tetrahydrocannabinol 10/21/2020 NONE  DETECTED  NONE DETECTED Final  . Barbiturates 10/21/2020 NONE DETECTED  NONE DETECTED Final   Comment: (NOTE) DRUG SCREEN FOR MEDICAL PURPOSES ONLY.  IF CONFIRMATION IS NEEDED FOR ANY PURPOSE, NOTIFY LAB WITHIN 5 DAYS.  LOWEST DETECTABLE LIMITS FOR URINE DRUG SCREEN Drug Class                     Cutoff (ng/mL) Amphetamine and metabolites    1000 Barbiturate and metabolites    200 Benzodiazepine                 200 Tricyclics and metabolites     300 Opiates and metabolites        300 Cocaine and metabolites        300 THC                            50 Performed at [facility], [address], [place], [place] [address]   Admission on 09/26/2020, Discharged on 09/27/2020  Component Date Value Ref Range Status  . Sodium 09/26/2020 140  135 - 145 mmol/L Final  . Potassium 09/26/2020 3.6  3.5 - 5.1 mmol/L Final  . Chloride 09/26/2020 106  98 - 111 mmol/L Final  . CO2 09/26/2020 25  22 - 32 mmol/L Final  . Glucose, Bld 09/26/2020 104* 70 - 99 mg/dL Final   Glucose reference range applies only to samples taken after fasting for at least 8 hours.  . BUN 09/26/2020 15  6 - 20 mg/dL Final  . Creatinine, Ser 09/26/2020 1.16  0.61 - 1.24 mg/dL Final  . Calcium 64/68/0321 9.6  8.9 - 10.3 mg/dL Final  . GFR, Estimated 09/26/2020 >60  >60 mL/min Final  Comment: (NOTE) Calculated using the CKD-EPI Creatinine Equation (2021)   . Anion gap 09/26/2020 9  5 - 15 Final   Performed at [facility], [address] [place], [place] [address]  . WBC 09/26/2020 4.6  4.0 - 10.5 K/uL Final  . RBC 09/26/2020 4.45  4.22 - 5.81 MIL/uL Final  . Hemoglobin 09/26/2020 13.3  13.0 - 17.0 g/dL Final  . HCT 95/62/1308 41.8  39.0 - 52.0 % Final  . MCV 09/26/2020 93.9  80.0 - 100.0 fL Final  . MCH 09/26/2020 29.9  26.0 - 34.0 pg Final  . MCHC 09/26/2020 31.8  30.0 - 36.0 g/dL Final  . RDW 65/78/4696 13.1  11.5 - 15.5 % Final  . Platelets 09/26/2020 194  150 - 400 K/uL Final  . nRBC 09/26/2020  0.0  0.0 - 0.2 % Final   Performed at [facility], [address] [place], [place] [address]  . Troponin I (High Sensitivity) 09/26/2020 9  <18 ng/L Final   Comment: (NOTE) Elevated high sensitivity troponin I (hsTnI) values and significant  changes across serial measurements may suggest ACS but many other  chronic and acute conditions are known to elevate hsTnI results.  Refer to the "Links" section for chest pain algorithms and additional  guidance. Performed at [facility], [address] [place], [place] [address]   . Troponin I (High Sensitivity) 09/27/2020 7  <18 ng/L Final   Comment: (NOTE) Elevated high sensitivity troponin I (hsTnI) values and significant  changes across serial measurements may suggest ACS but many other  chronic and acute conditions are known to elevate hsTnI results.  Refer to the "Links" section for chest pain algorithms and additional  guidance. Performed at [facility], [address] [place], [place] [address]   . SARS Coronavirus 2 by RT PCR 09/27/2020 NEGATIVE  NEGATIVE Final   Comment: (NOTE) SARS-CoV-2 target nucleic acids are NOT DETECTED.  The SARS-CoV-2 RNA is generally detectable in upper respiratory specimens during the acute phase of infection. The lowest concentration of SARS-CoV-2 viral copies this assay can detect is 138 copies/mL. A negative result does not preclude SARS-Cov-2 infection and should not be used as the sole basis for treatment or other patient management decisions. A negative result may occur with  improper specimen collection/handling, submission of specimen other than nasopharyngeal swab, presence of viral mutation(s) within the areas targeted by this assay, and inadequate number of viral copies(<138 copies/mL). A negative result must be combined with clinical observations, patient history, and epidemiological information. The expected result is Negative.  Fact Sheet for Patients:   BloggerCourse.com  Fact Sheet for Healthcare Providers:  SeriousBroker.it  This test is no                          t yet approved or cleared by the [place] FDA and  has been authorized for detection and/or diagnosis of SARS-CoV-2 by FDA under an Emergency Use Authorization (EUA). This EUA will remain  in effect (meaning this test can be used) for the duration of the COVID-19 declaration under Section 564(b)(1) of the Act, 21 U.S.C.section 360bbb-3(b)(1), unless the authorization is terminated  or revoked sooner.      . Influenza A by PCR 09/27/2020 NEGATIVE  NEGATIVE Final  . Influenza B by PCR 09/27/2020 NEGATIVE  NEGATIVE Final   Comment: (NOTE) The Xpert Xpress SARS-CoV-2/FLU/RSV plus assay is intended as an aid in  the diagnosis of influenza from Nasopharyngeal swab specimens and should not be used as a sole basis for treatment. Nasal washings and aspirates are unacceptable for Xpert Xpress SARS-CoV-2/FLU/RSV testing.  Fact Sheet for Patients: BloggerCourse.com  Fact Sheet for Healthcare Providers: SeriousBroker.it  This test is not yet approved or cleared by the [place] FDA and has been authorized for detection and/or diagnosis of SARS-CoV-2 by FDA under an Emergency Use Authorization (EUA). This EUA will remain in effect (meaning this test can be used) for the duration of the COVID-19 declaration under Section 564(b)(1) of the Act, 21 U.S.C. section 360bbb-3(b)(1), unless the authorization is terminated or revoked.  Performed at [facility], [address], [place], [place] [address]   . Alcohol, Ethyl (B) 09/27/2020 <10  <10 mg/dL Final   Comment: (NOTE) Lowest detectable limit for serum alcohol is 10 mg/dL.  For medical purposes only. Performed at [facility], [address], [place], [place] [address]   . Valproic Acid Lvl 09/27/2020  <10* 50.0 - 100.0 ug/mL Final   Comment: RESULTS CONFIRMED BY MANUAL DILUTION Performed at [facility], [address], [place], [place] [address]   Admission on 09/26/2020, Discharged on 09/26/2020  Component Date Value Ref Range Status  . Sodium 09/26/2020 138  135 - 145 mmol/L Final  . Potassium 09/26/2020 3.7  3.5 - 5.1 mmol/L Final  . Chloride 09/26/2020 105  98 - 111 mmol/L Final  . CO2 09/26/2020 22  22 - 32 mmol/L Final  . Glucose, Bld 09/26/2020 92  70 - 99 mg/dL Final   Glucose reference range applies only to samples taken after fasting for at least 8 hours.  . BUN 09/26/2020 15  6 - 20 mg/dL Final  . Creatinine, Ser 09/26/2020 1.03  0.61 - 1.24 mg/dL Final  . Calcium 91/47/8295 9.2  8.9 - 10.3 mg/dL Final  . Total Protein 09/26/2020 7.6  6.5 - 8.1 g/dL Final  . Albumin 62/13/0865 4.3  3.5 - 5.0 g/dL Final  . AST 78/46/9629 30  15 - 41 U/L Final  . ALT 09/26/2020 28  0 - 44 U/L Final  . Alkaline Phosphatase 09/26/2020 70  38 - 126 U/L Final  . Total Bilirubin 09/26/2020 0.8  0.3 - 1.2 mg/dL Final  . GFR, Estimated 09/26/2020 >60  >60 mL/min Final   Comment: (NOTE) Calculated using the CKD-EPI Creatinine Equation (2021)   . Anion gap 09/26/2020 11  5 - 15 Final   Performed at [facility], [address], [place], [place] [address]  . WBC 09/26/2020 5.9  4.0 - 10.5 K/uL Final  . RBC 09/26/2020 4.22  4.22 - 5.81 MIL/uL Final  . Hemoglobin 09/26/2020 12.8* 13.0 - 17.0 g/dL Final  . HCT 32/44/0102 38.8* 39.0 - 52.0 % Final  . MCV 09/26/2020 91.9  80.0 - 100.0 fL Final  . MCH 09/26/2020 30.3  26.0 - 34.0 pg Final  . MCHC 09/26/2020 33.0  30.0 - 36.0 g/dL Final  . RDW 72/53/6644 13.1  11.5 - 15.5 % Final  . Platelets 09/26/2020 166  150 - 400 K/uL Final  . nRBC 09/26/2020 0.0  0.0 - 0.2 % Final   Performed at [facility], [address] [place], [place] [address]  . Alcohol, Ethyl (B) 09/26/2020 <10  <10 mg/dL Final   Comment:  (NOTE) Lowest detectable limit for serum alcohol is 10 mg/dL.  For medical purposes only. Performed at [facility], [address] [place], [place] [address]   . Salicylate Lvl 09/26/2020 <7.0* 7.0 - 30.0 mg/dL Final   Performed at [facility], [address], [place], [place] [address]  . Acetaminophen (Tylenol), Serum 09/26/2020 <10* 10 - 30 ug/mL Final   Comment: (NOTE) Therapeutic concentrations vary significantly. A range of 10-30 ug/mL  may be an effective concentration for many patients. However, some  are best treated at concentrations outside of this range. Acetaminophen concentrations >150 ug/mL at 4 hours after ingestion  and >50 ug/mL at 12 hours after ingestion are often associated with  toxic reactions.  Performed at [facility], [address], [place], [place] [address]   Admission on 08/23/2020, Discharged on 08/24/2020  Component Date Value Ref Range Status  . SARS Coronavirus 2 by RT PCR 08/23/2020 NEGATIVE  NEGATIVE Final   Comment: (NOTE) SARS-CoV-2 target nucleic acids are NOT DETECTED.  The SARS-CoV-2 RNA is generally detectable in upper respiratoy specimens during the acute phase of infection. The lowest concentration of SARS-CoV-2 viral copies this assay can detect is 131 copies/mL. A negative result does not preclude SARS-Cov-2 infection and should not be used as the sole basis for treatment or other patient management decisions. A negative result may occur with  improper specimen collection/handling, submission of specimen other than nasopharyngeal swab, presence of viral mutation(s) within the areas targeted by this assay, and inadequate number of viral copies (<131 copies/mL). A negative result must be combined with clinical observations, patient history, and epidemiological information. The expected result is Negative.  Fact Sheet for Patients:   https://www.moore.com/  Fact Sheet for Healthcare Providers:  https://www.young.biz/  This test is no                          t yet approved or cleared by the [place] FDA and  has been authorized for detection and/or diagnosis of SARS-CoV-2 by FDA under an Emergency Use Authorization (EUA). This EUA will remain  in effect (meaning this test can be used) for the duration of the COVID-19 declaration under Section 564(b)(1) of the Act, 21 U.S.C. section 360bbb-3(b)(1), unless the authorization is terminated or revoked sooner.    . Influenza A by PCR 08/23/2020 NEGATIVE  NEGATIVE Final  . Influenza B by PCR 08/23/2020 NEGATIVE  NEGATIVE Final   Comment: (NOTE) The Xpert Xpress SARS-CoV-2/FLU/RSV assay is intended as an aid in  the diagnosis of influenza from Nasopharyngeal swab specimens and  should not be used as a sole basis for treatment. Nasal washings and  aspirates are unacceptable for Xpert Xpress SARS-CoV-2/FLU/RSV  testing.  Fact Sheet for Patients: https://www.moore.com/  Fact Sheet for Healthcare Providers: https://www.young.biz/  This test is not yet approved or cleared by the [place] FDA and  has been authorized for detection and/or diagnosis of SARS-CoV-2 by  FDA under an Emergency Use Authorization (EUA). This EUA will remain  in effect (meaning this test can be used) for the duration of the  Covid-19 declaration under Section 564(b)(1) of the Act, 21  U.S.C. section 360bbb-3(b)(1), unless the authorization is  terminated or revoked. Performed at [facility], [address] [place], [place] [address]   Admission on 07/24/2020, Discharged on 07/26/2020  Component Date Value Ref Range Status  . Sodium 07/25/2020 138  135 - 145 mmol/L Final  . Potassium 07/25/2020 3.8  3.5 - 5.1 mmol/L Final  . Chloride 07/25/2020 100  98 -  111 mmol/L Final  . CO2  07/25/2020 25  22 - 32 mmol/L Final  . Glucose, Bld 07/25/2020 163* 70 - 99 mg/dL Final   Glucose reference range applies only to samples taken after fasting for at least 8 hours.  . BUN 07/25/2020 14  6 - 20 mg/dL Final  . Creatinine, Ser 07/25/2020 1.07  0.61 - 1.24 mg/dL Final  . Calcium 16/10/960410/12/2019 9.7  8.9 - 10.3 mg/dL Final  . Total Protein 07/25/2020 8.1  6.5 - 8.1 g/dL Final  . Albumin 54/09/811910/12/2019 4.7  3.5 - 5.0 g/dL Final  . AST 14/78/295610/12/2019 30  15 - 41 U/L Final  . ALT 07/25/2020 27  0 - 44 U/L Final  . Alkaline Phosphatase 07/25/2020 69  38 - 126 U/L Final  . Total Bilirubin 07/25/2020 1.2  0.3 - 1.2 mg/dL Final  . GFR calc non Af Amer 07/25/2020 >60  >60 mL/min Final  . GFR calc Af Amer 07/25/2020 >60  >60 mL/min Final  . Anion gap 07/25/2020 13  5 - 15 Final   Performed at [facility], [address] [place], [place] [address]  . Alcohol, Ethyl (B) 07/25/2020 <10  <10 mg/dL Final   Comment: (NOTE) Lowest detectable limit for serum alcohol is 10 mg/dL.  For medical purposes only. Performed at [facility], [address] [place], [place] [address]   . Salicylate Lvl 07/25/2020 <7.0* 7.0 - 30.0 mg/dL Final   Performed at [facility], [address] [place], [place] [address]  . Acetaminophen (Tylenol), Serum 07/25/2020 <10* 10 - 30 ug/mL Final   Comment: (NOTE) Therapeutic concentrations vary significantly. A range of 10-30 ug/mL  may be an effective concentration for many patients. However, some  are best treated at concentrations outside of this range. Acetaminophen concentrations >150 ug/mL at 4 hours after ingestion  and >50 ug/mL at 12 hours after ingestion are often associated with  toxic reactions.  Performed at [facility], [address] [place], [place] [address]   . WBC 07/25/2020 8.5  4.0 - 10.5 K/uL Final  . RBC 07/25/2020 4.45  4.22 - 5.81 MIL/uL Final  . Hemoglobin  07/25/2020 13.7  13.0 - 17.0 g/dL Final  . HCT 32/44/010210/12/2019 40.7  39.0 - 52.0 % Final  . MCV 07/25/2020 91.5  80.0 - 100.0 fL Final  . MCH 07/25/2020 30.8  26.0 - 34.0 pg Final  . MCHC 07/25/2020 33.7  30.0 - 36.0 g/dL Final  . RDW 72/53/664410/12/2019 13.9  11.5 - 15.5 % Final  . Platelets 07/25/2020 226  150 - 400 K/uL Final  . nRBC 07/25/2020 0.0  0.0 - 0.2 % Final   Performed at [facility], [address] [place], [place] [address]  . Opiates 07/25/2020 NONE DETECTED  NONE DETECTED Final  . Cocaine 07/25/2020 POSITIVE* NONE DETECTED Final  . Benzodiazepines 07/25/2020 NONE DETECTED  NONE DETECTED Final  . Amphetamines 07/25/2020 NONE DETECTED  NONE DETECTED Final  . Tetrahydrocannabinol 07/25/2020 POSITIVE* NONE DETECTED Final  . Barbiturates 07/25/2020 NONE DETECTED  NONE DETECTED Final   Comment: (NOTE) DRUG SCREEN FOR MEDICAL PURPOSES ONLY.  IF CONFIRMATION IS NEEDED FOR ANY PURPOSE, NOTIFY LAB WITHIN 5 DAYS.  LOWEST DETECTABLE LIMITS FOR URINE DRUG SCREEN Drug Class                     Cutoff (ng/mL) Amphetamine and metabolites    1000 Barbiturate and metabolites  200 Benzodiazepine                 200 Tricyclics and metabolites     300 Opiates and metabolites        300 Cocaine and metabolites        300 THC                            50 Performed at [facility], [address], [place], [place] [address]   . SARS Coronavirus 2 by RT PCR 07/25/2020 NEGATIVE  NEGATIVE Final   Comment: (NOTE) SARS-CoV-2 target nucleic acids are NOT DETECTED.  The SARS-CoV-2 RNA is generally detectable in upper respiratoy specimens during the acute phase of infection. The lowest concentration of SARS-CoV-2 viral copies this assay can detect is 131 copies/mL. A negative result does not preclude SARS-Cov-2 infection and should not be used as the sole basis for treatment or other patient management decisions. A negative result may occur with  improper  specimen collection/handling, submission of specimen other than nasopharyngeal swab, presence of viral mutation(s) within the areas targeted by this assay, and inadequate number of viral copies (<131 copies/mL). A negative result must be combined with clinical observations, patient history, and epidemiological information. The expected result is Negative.  Fact Sheet for Patients:  https://www.moore.com/  Fact Sheet for Healthcare Providers:  https://www.young.biz/  This test is no                          t yet approved or cleared by the [place] FDA and  has been authorized for detection and/or diagnosis of SARS-CoV-2 by FDA under an Emergency Use Authorization (EUA). This EUA will remain  in effect (meaning this test can be used) for the duration of the COVID-19 declaration under Section 564(b)(1) of the Act, 21 U.S.C. section 360bbb-3(b)(1), unless the authorization is terminated or revoked sooner.    . Influenza A by PCR 07/25/2020 NEGATIVE  NEGATIVE Final  . Influenza B by PCR 07/25/2020 NEGATIVE  NEGATIVE Final   Comment: (NOTE) The Xpert Xpress SARS-CoV-2/FLU/RSV assay is intended as an aid in  the diagnosis of influenza from Nasopharyngeal swab specimens and  should not be used as a sole basis for treatment. Nasal washings and  aspirates are unacceptable for Xpert Xpress SARS-CoV-2/FLU/RSV  testing.  Fact Sheet for Patients: https://www.moore.com/  Fact Sheet for Healthcare Providers: https://www.young.biz/  This test is not yet approved or cleared by the [place] FDA and  has been authorized for detection and/or diagnosis of SARS-CoV-2 by  FDA under an Emergency Use Authorization (EUA). This EUA will remain  in effect (meaning this test can be used) for the duration of the  Covid-19 declaration under Section 564(b)(1) of the Act, 21  U.S.C. section 360bbb-3(b)(1), unless the  authorization is  terminated or revoked. Performed at [facility] [address] [place], [place] [address]   Admission on 05/15/2020, Discharged on 05/16/2020  Component Date Value Ref Range Status  . Sodium 05/15/2020 140  135 - 145 mmol/L Final  . Potassium 05/15/2020 4.2  3.5 - 5.1 mmol/L Final  . Chloride 05/15/2020 107  98 - 111 mmol/L Final  . CO2 05/15/2020 24  22 - 32 mmol/L Final  . Glucose, Bld 05/15/2020 99  70 - 99 mg/dL Final   Glucose reference range applies only to samples taken after fasting for at least 8  hours.  . BUN 05/15/2020 24* 6 - 20 mg/dL Final  . Creatinine, Ser 05/15/2020 1.31* 0.61 - 1.24 mg/dL Final  . Calcium 16/07/9603 9.5  8.9 - 10.3 mg/dL Final  . GFR calc non Af Amer 05/15/2020 >60  >60 mL/min Final  . GFR calc Af Amer 05/15/2020 >60  >60 mL/min Final  . Anion gap 05/15/2020 9  5 - 15 Final   Performed at [facility], [address] [place], [place] [address]  . WBC 05/15/2020 6.2  4.0 - 10.5 K/uL Final  . RBC 05/15/2020 4.14* 4.22 - 5.81 MIL/uL Final  . Hemoglobin 05/15/2020 12.8* 13.0 - 17.0 g/dL Final  . HCT 11/91/4782 39.7  39.0 - 52.0 % Final  . MCV 05/15/2020 95.9  80.0 - 100.0 fL Final  . MCH 05/15/2020 30.9  26.0 - 34.0 pg Final  . MCHC 05/15/2020 32.2  30.0 - 36.0 g/dL Final  . RDW 95/62/1308 13.3  11.5 - 15.5 % Final  . Platelets 05/15/2020 213  150 - 400 K/uL Final  . nRBC 05/15/2020 0.0  0.0 - 0.2 % Final   Performed at [facility], [address] [place], [place] [address]  . Troponin I (High Sensitivity) 05/15/2020 7  <18 ng/L Final   Comment: (NOTE) Elevated high sensitivity troponin I (hsTnI) values and significant  changes across serial measurements may suggest ACS but many other  chronic and acute conditions are known to elevate hsTnI results.  Refer to the "Links" section for chest pain algorithms and additional  guidance. Performed at [facility], [address] [place],  [place] [address]   . Troponin I (High Sensitivity) 05/16/2020 5  <18 ng/L Final   Comment: (NOTE) Elevated high sensitivity troponin I (hsTnI) values and significant  changes across serial measurements may suggest ACS but many other  chronic and acute conditions are known to elevate hsTnI results.  Refer to the "Links" section for chest pain algorithms and additional  guidance. Performed at [facility], [address] [place], [place] [address]   . D-Dimer, Quant 05/16/2020 <0.27  0.00 - 0.50 ug/mL-FEU Final   Comment: (NOTE) At the manufacturer cut-off of 0.50 ug/mL FEU, this assay has been documented to exclude PE with a sensitivity and negative predictive value of 97 to 99%.  At this time, this assay has not been approved by the FDA to exclude DVT/VTE. Results should be correlated with clinical presentation. Performed at [facility], [address] [place] [address] [place], [place] [address]     Allergies: Carrot [daucus carota] and Penicillins  PTA Medications: (Not in a hospital admission)   Medical Decision Making  Patient was medically cleared in the emergency department.    Recommendations  Based on my evaluation the patient does not appear to have an emergency medical condition.   Patient will be placed in continuous assessment area at [facility] for treatment and stabilization.  Patient will be reevaluated on 10/22/2020, disposition will be determined at that time.  Patrcia Dolly, FNP 10/21/20  7:06 PM

## 2020-10-21 NOTE — ED Notes (Signed)
Called safe transport and they said they will be here shortly.

## 2020-10-21 NOTE — ED Provider Notes (Signed)
[facility] EMERGENCY DEPARTMENT Provider Note   CSN: [id number] Arrival date & time: 10/21/20  1013     History Chief Complaint  Patient presents with  . Hallucinations    Cory Floyd is a 44 y.o. male.  Cory Floyd is a 44 y.o. male with a history of schizophrenia, diabetes, hypertension, bipolar disorder, substance abuse, who presents to the emergency department for evaluation of hallucinations.  Patient reports that over the few weeks he has been having increasing hallucinations.  He reports he hears voices and seeing shadows.  He reports the voices say that they are out to get him.  They do not tell him to hurt himself or hurt other people.  He states that he is always have these voices intermittently but over the past 2 weeks they have become a lot more persistent and started to make him want to hurt himself to make the voices stop.  He has not done anything in attempt to harm himself prior to arrival today but does report after being clean for quite a while he did consume a large amount of alcohol last night and also did some crack.  He denies any other drug use.  Denies any homicidal ideations.  Reports that he was previously on psychiatric medications, has been without them for a few weeks but did not feel like they were really giving him much benefit.  He reports that he has had some pain in his low back and right knee intermittently, has had multiple surgeries on his right knee and reports some increasing pain over the anterior knee that he has fallen on a few times in the last month and back pain that is worse in the morning without associated loss of bowel or bladder control, no numbness or weakness in his lower extremities.  Denies chest pain, shortness of breath, cough or fever.  No current abdominal pain or vomiting.  No other complaints today.  The history is provided by the patient.       Past Medical History:  Diagnosis Date  . Alcoholic (HCC)     clean for 3 years  . Anginal pain (HCC)   . Arthritis   . Bipolar 1 disorder (HCC)   . COVID-19   . Depressed   . Diabetes mellitus without complication (HCC)   . Dyspnea   . Fatty liver   . Hypertension   . Schizophrenia [facility])     Patient Active Problem List   Diagnosis Date Noted  . Substance induced mood disorder (HCC) 07/26/2020  . Suicidal ideation   . Polysubstance dependence (HCC) 07/25/2020  . Bipolar affective disorder, depressed, moderate (HCC) 03/06/2020  . Arthritis 09/30/2019  . Gastroesophageal reflux disease without esophagitis 09/21/2016  . Obstructive sleep apnea 09/21/2016  . Morbid obesity with BMI of 40.0-44.9, adult (HCC) 08/15/2016  . Stable angina pectoris (HCC) 08/15/2016    Past Surgical History:  Procedure Laterality Date  . COLONOSCOPY  02/05/2014   diverticulosis, hyperplastic polyp x 2  . fracture arm Right   . KNEE SURGERY Right 2013  . NASAL SEPTUM SURGERY    . NASAL TURBINATE REDUCTION Bilateral 12/12/2016   Procedure: TURBINATE REDUCTION/SUBMUCOSAL RESECTION;  Surgeon: Linus Salmons, MD;  Location: [facility] ORS;  Service: ENT;  Laterality: Bilateral;       Family History  Problem Relation Age of Onset  . Hepatitis C Mother   . Cancer Mother   . Cancer Maternal Aunt   . Hypertension Maternal Grandmother  Social History   Tobacco Use  . Smoking status: Current Every Day Smoker    Packs/day: 1.00    Years: 20.00    Pack years: 20.00    Types: Cigarettes  . Smokeless tobacco: Never Used  Vaping Use  . Vaping Use: Never used  Substance Use Topics  . Alcohol use: Yes    Comment: 1/2-5th pint of liquor  . Drug use: Yes    Types: Cocaine, Marijuana    Comment: heroin    Home Medications Prior to Admission medications   Medication Sig Start Date End Date Taking? Authorizing Provider  fluticasone (FLONASE) 50 MCG/ACT nasal spray Place 1 spray into both nostrils daily. 08/24/20   Petrucelli, Samantha R, [name]  ARIPiprazole  (ABILIFY) 30 MG tablet Take 1 tablet (30 mg total) by mouth daily. Patient not taking: Reported on 03/02/2020 10/28/19 05/16/20  Money, Gerlene Burdockravis B, FNP  divalproex (DEPAKOTE) 500 MG DR tablet Take 1 tablet (500 mg total) by mouth daily. Patient not taking: Reported on 05/16/2020 03/08/20 05/16/20  Charm RingsLord, Jamison Y, NP    Allergies    Carrot [daucus carota] and Penicillins  Review of Systems   Review of Systems  Constitutional: Negative for chills and fever.  HENT: Negative.   Eyes: Negative for visual disturbance.  Respiratory: Negative for cough and shortness of breath.   Cardiovascular: Negative for chest pain.  Gastrointestinal: Negative for abdominal pain, nausea and vomiting.  Musculoskeletal: Negative for arthralgias and myalgias.  Skin: Negative for color change and rash.  Neurological: Negative for headaches.  Psychiatric/Behavioral: Positive for dysphoric mood, hallucinations and suicidal ideas. Negative for agitation, behavioral problems and self-injury.  All other systems reviewed and are negative.   Physical Exam Updated Vital Signs BP 120/80 (BP Location: Right Wrist)   Pulse 88   Temp 98 F (36.7 C) (Oral)   Resp 20   Ht 5\' 10"  (1.778 m)   Wt 90.7 kg   SpO2 100%   BMI 28.70 kg/m   Physical Exam Vitals and nursing note reviewed.  Constitutional:      General: He is not in acute distress.    Appearance: Normal appearance. He is well-developed and well-nourished. He is not diaphoretic.     Comments: Patient is very restless and often staring off into space while talking, alert and in no acute distress  HENT:     Head: Normocephalic and atraumatic.     Mouth/Throat:     Mouth: Oropharynx is clear and moist. Mucous membranes are moist.     Pharynx: Oropharynx is clear.  Eyes:     General:        Right eye: No discharge.        Left eye: No discharge.     Extraocular Movements: EOM normal.  Cardiovascular:     Rate and Rhythm: Normal rate and regular rhythm.      Pulses: Intact distal pulses.     Heart sounds: Normal heart sounds. No murmur heard. No friction rub. No gallop.   Pulmonary:     Effort: Pulmonary effort is normal. No respiratory distress.     Breath sounds: Normal breath sounds. No wheezing or rales.     Comments: Respirations equal and unlabored, patient able to speak in full sentences, lungs clear to auscultation bilaterally Abdominal:     General: Bowel sounds are normal. There is no distension.     Palpations: Abdomen is soft. There is no mass.     Tenderness: There is no abdominal tenderness.  There is no guarding.     Comments: Abdomen soft, nondistended, nontender to palpation in all quadrants without guarding or peritoneal signs   Musculoskeletal:        General: No deformity or edema.     Cervical back: Neck supple.     Comments: Previous surgical scars noted over the right knee with bony prominence over the anterior knee and tibial tuberosity, no overlying erythema, warmth or effusion, full range of motion. Patient also has some mild tenderness to the mid low back without palpable deformity or overlying skin changes.  Range of motion intact in bilateral lower extremities.  Skin:    General: Skin is warm and dry.     Capillary Refill: Capillary refill takes less than 2 seconds.  Neurological:     Mental Status: He is alert.     Coordination: Coordination normal.     Comments: Speech is clear, able to follow commands Moves extremities without ataxia, coordination intact  Psychiatric:        Attention and Perception: He perceives auditory and visual hallucinations.        Mood and Affect: Mood is depressed. Affect is blunt.        Speech: Speech normal.        Thought Content: Thought content is paranoid. Thought content includes suicidal ideation. Thought content does not include homicidal ideation. Thought content does not include suicidal plan.     ED Results / Procedures / Treatments   Labs (all labs ordered are  listed, but only abnormal results are displayed) Labs Reviewed  COMPREHENSIVE METABOLIC PANEL - Abnormal; Notable for the following components:      Result Value   CO2 21 (*)    Glucose, Bld 179 (*)    All other components within normal limits  SALICYLATE LEVEL - Abnormal; Notable for the following components:   Salicylate Lvl <7.0 (*)    All other components within normal limits  ACETAMINOPHEN LEVEL - Abnormal; Notable for the following components:   Acetaminophen (Tylenol), Serum <10 (*)    All other components within normal limits  CBC - Abnormal; Notable for the following components:   HCT 38.8 (*)    All other components within normal limits  ETHANOL  RAPID URINE DRUG SCREEN, HOSP PERFORMED    EKG None  Radiology No results found.  Procedures Procedures (including critical care time)  Medications Ordered in ED Medications - No data to display  ED Course  I have reviewed the triage vital signs and the nursing notes.  Pertinent labs & imaging results that were available during my care of the patient were reviewed by me and considered in my medical decision making (see chart for details).    MDM Rules/Calculators/A&P                         Patient presents with auditory and visual hallucinations and suicidal ideations.  Reports worsening hallucinations with voices saying that they are out to get him, he wants relief from the voices that are worse when he is alone, so he started to have thoughts of suicide.  He drank a large amount of alcohol and used crack cocaine in an attempt to help with voices.  Reports he does not like living like this.  Has been off of psychiatric medications.  Reports some intermittent right knee and low back pain but otherwise no focal medical complaints.  Will get medical screening labs and x-rays of the lumbar  spine and right knee.  This screening labs overall reassuring.  Glucose of 179, but no other significant electrolyte derangements, no  leukocytosis and stable hemoglobin.  Ethanol level is no longer elevated after reported heavy alcohol consumption last night, patient does not drink daily and is not exhibiting symptoms concerning for alcohol withdrawal.  UDS positive for cocaine as expected.  X-rays of the right knee and low back with some degenerative changes but no acute bony abnormalities.  At this time patient is medically cleared pending TTS evaluation.  Covid test pending.  TTS recommends overnight observation and reevaluation in the morning.  Patient has been accepted at [facility] for observation.  Nursing staff to call report and arrange transport   The patient has been placed in psychiatric observation due to the need to provide a safe environment for the patient while obtaining psychiatric consultation and evaluation, as well as ongoing medical and medication management to treat the patient's condition.  The patient has not been placed under full IVC at this time.   Final Clinical Impression(s) / ED Diagnoses Final diagnoses:  Hallucinations  Suicidal ideation    Rx / DC Orders ED Discharge Orders    None       Legrand Rams 10/21/20 1756    Gwyneth Sprout, MD 10/21/20 2115

## 2020-10-21 NOTE — ED Notes (Signed)
Pt arrived at facility as direct admit. Irritated, yelling at staff stating he was lied to. He stated he wanted inpatient rehab and they sent him here. Explained criteria and protocol for transition from [facility] to inpatient facility. No COVID results available to review so pt in room #125 pending negative COVID results. Pt offered meal and drink while waiting. Safety maintained.

## 2020-10-21 NOTE — Consult Note (Addendum)
Telepsych Consultation   Location of Patient: [facility] Location of Provider: [facility]  Patient Identification: Cory Floyd MRN:  [id number] Principal Diagnosis: Polysubstance dependence [facility]) Diagnosis:  Principal Problem:   Polysubstance dependence (HCC)   Total Time spent with patient: 30 minutes  HPI:  Cory Floyd is a 44 year old male with history of polysubstance use disorder who presented to [facility] today reporting AH and paranoia after consuming alcohol and smoking crack last night. On assessment he appears irritable and states he came to the hospital because "I'm going crazy. Paranoid." He reports running out of his medications three weeks ago. He reports SI "to do something quick and painless" and not contracting for safety. Reports depressed/irritable mood with anhedonia, insomnia, fatigue, and hopelessness. He reports AH telling him that people are after him. He has been staying in a transitional living house through his employer but reports high drug use there which has contributed to his mood instability and drug use. He admits to increased crack use since living there. He also reports occasional alcohol use but denies daily alcohol use. UDS positive for cocaine, BAL negative. He reports previously taking Depakote ER 1500 mg, Abilify 15 mg daily, 10 mg propranolol, unknown dose of Remeron, and trazodone 200 mg QHS from a provider in [facility]. He states he is now a patient of [facility] but has not been started on medications by them. He states medications were helpful for mood instability but had persistent hallucinations. Per chart multiple prior drug screens positive for cocaine. He denies HI.   Disposition: Recommend overnight observation at [facility] to restart medications and allow time for drugs to clear from system. Discussed with patient restarting Depakote, Remeron, propranolol and trazodone. Patient denies prior trials of Risperdal and agreeable to  starting this medication due to reports of continued AVH on Abilify. Called and spoke with [facility] provider. ED staff updated.  Past Psychiatric History: Multiple prior ED visits psychiatric hospitalizations related to substance use  Risk to Self:   Risk to Others:   Prior Inpatient Therapy:   Prior Outpatient Therapy:    Past Medical History:  Past Medical History:  Diagnosis Date  . 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 Anginal pain (HCC)   . Arthritis   . Bipolar 1 disorder (HCC)   . COVID-19   . Depressed   . Diabetes mellitus without complication (HCC)   . Dyspnea   . Fatty liver   . Hypertension   . Schizophrenia [facility])     Past Surgical History:  Procedure Laterality Date  . COLONOSCOPY  02/05/2014   diverticulosis, hyperplastic polyp x 2  . fracture arm Right   . KNEE SURGERY Right 2013  . NASAL SEPTUM SURGERY    . NASAL TURBINATE REDUCTION Bilateral 12/12/2016   Procedure: TURBINATE REDUCTION/SUBMUCOSAL RESECTION;  Surgeon: Linus Salmons, MD;  Location: [facility] ORS;  Service: ENT;  Laterality: Bilateral;   Family History:  Family History  Problem Relation Age of Onset  . Hepatitis C Mother   . Cancer Mother   . Cancer Maternal Aunt   . Hypertension Maternal Grandmother    Family Psychiatric  History: Unknown Social History:  Social History   Substance and Sexual Activity  Alcohol Use Yes   Comment: 1/2-5th pint of liquor     Social History   Substance and Sexual Activity  Drug Use Yes  . Types: Cocaine, Marijuana   Comment: heroin    Social History  Socioeconomic History  . Marital status: Divorced    Spouse name: Not on file  . Number of children: Not on file  . Years of education: Not on file  . Highest education level: Not on file  Occupational History  . Not on file  Tobacco Use  . Smoking status: Current Every Day Smoker    Packs/day: 1.00    Years: 20.00    Pack years: 20.00    Types: Cigarettes  . Smokeless tobacco: Never Used   Vaping Use  . Vaping Use: Never used  Substance and Sexual Activity  . Alcohol use: Yes    Comment: 1/2-5th pint of liquor  . Drug use: Yes    Types: Cocaine, Marijuana    Comment: heroin  . Sexual activity: Yes    Birth control/protection: None  Other Topics Concern  . Not on file  Social History Narrative  . Not on file   Social Determinants of Health   Financial Resource Strain: Not on file  Food Insecurity: Not on file  Transportation Needs: Not on file  Physical Activity: Not on file  Stress: Not on file  Social Connections: Not on file   Additional Social History:    Allergies:   Allergies  Allergen Reactions  . Carrot [Daucus Carota] Anaphylaxis and Rash  . Penicillins Anaphylaxis and Hives    Has patient had a PCN reaction causing immediate rash, facial/tongue/throat swelling, SOB or lightheadedness with hypotension: Yes Has patient had a PCN reaction causing severe rash involving mucus membranes or skin necrosis: No Has patient had a PCN reaction that required hospitalization Yes Has patient had a PCN reaction occurring within the last 10 years: No If all of the above answers are "NO", then may proceed with Cephalosporin use.     Labs:  Results for orders placed or performed during the hospital encounter of 10/21/20 (from the past 48 hour(s))  Comprehensive metabolic panel     Status: Abnormal   Collection Time: 10/21/20 10:25 AM  Result Value Ref Range   Sodium 136 135 - 145 mmol/L   Potassium 3.6 3.5 - 5.1 mmol/L   Chloride 102 98 - 111 mmol/L   CO2 21 (L) 22 - 32 mmol/L   Glucose, Bld 179 (H) 70 - 99 mg/dL    Comment: Glucose reference range applies only to samples taken after fasting for at least 8 hours.   BUN 16 6 - 20 mg/dL   Creatinine, Ser 1.611.16 0.61 - 1.24 mg/dL   Calcium 9.1 8.9 - 09.610.3 mg/dL   Total Protein 7.5 6.5 - 8.1 g/dL   Albumin 4.3 3.5 - 5.0 g/dL   AST 29 15 - 41 U/L   ALT 24 0 - 44 U/L   Alkaline Phosphatase 66 38 - 126 U/L    Total Bilirubin 0.8 0.3 - 1.2 mg/dL   GFR, Estimated >04>60 >54>60 mL/min    Comment: (NOTE) Calculated using the CKD-EPI Creatinine Equation (2021)    Anion gap 13 5 - 15    Comment: Performed at [facility], [address] [place], [place] [address]  Ethanol     Status: None   Collection Time: 10/21/20 10:25 AM  Result Value Ref Range   Alcohol, Ethyl (B) <10 <10 mg/dL    Comment: (NOTE) Lowest detectable limit for serum alcohol is 10 mg/dL.  For medical purposes only. Performed at [facility], [address] [place], [place] [address]   Salicylate level  Status: Abnormal   Collection Time: 10/21/20 10:25 AM  Result Value Ref Range   Salicylate Lvl <7.0 (L) 7.0 - 30.0 mg/dL    Comment: Performed at [facility], [address] [place], [place] [address]  Acetaminophen level     Status: Abnormal   Collection Time: 10/21/20 10:25 AM  Result Value Ref Range   Acetaminophen (Tylenol), Serum <10 (L) 10 - 30 ug/mL    Comment: (NOTE) Therapeutic concentrations vary significantly. A range of 10-30 ug/mL  may be an effective concentration for many patients. However, some  are best treated at concentrations outside of this range. Acetaminophen concentrations >150 ug/mL at 4 hours after ingestion  and >50 ug/mL at 12 hours after ingestion are often associated with  toxic reactions.  Performed at [facility], [address], [place], [place] [address]   cbc     Status: Abnormal   Collection Time: 10/21/20 10:25 AM  Result Value Ref Range   WBC 5.4 4.0 - 10.5 K/uL   RBC 4.22 4.22 - 5.81 MIL/uL   Hemoglobin 13.1 13.0 - 17.0 g/dL   HCT 84.6 (L) 96.2 - 95.2 %   MCV 91.9 80.0 - 100.0 fL   MCH 31.0 26.0 - 34.0 pg   MCHC 33.8 30.0 - 36.0 g/dL   RDW 84.1 32.4 - 40.1 %   Platelets 177 150 - 400 K/uL   nRBC 0.0 0.0 - 0.2 %    Comment: Performed at [facility], [address], [place], [place] [address]  Rapid urine drug screen (hospital performed)      Status: Abnormal   Collection Time: 10/21/20  3:59 PM  Result Value Ref Range   Opiates NONE DETECTED NONE DETECTED   Cocaine POSITIVE (A) NONE DETECTED   Benzodiazepines NONE DETECTED NONE DETECTED   Amphetamines NONE DETECTED NONE DETECTED   Tetrahydrocannabinol NONE DETECTED NONE DETECTED   Barbiturates NONE DETECTED NONE DETECTED    Comment: (NOTE) DRUG SCREEN FOR MEDICAL PURPOSES ONLY.  IF CONFIRMATION IS NEEDED FOR ANY PURPOSE, NOTIFY LAB WITHIN 5 DAYS.  LOWEST DETECTABLE LIMITS FOR URINE DRUG SCREEN Drug Class                     Cutoff (ng/mL) Amphetamine and metabolites    1000 Barbiturate and metabolites    200 Benzodiazepine                 200 Tricyclics and metabolites     300 Opiates and metabolites        300 Cocaine and metabolites        300 THC                            50 Performed at [facility], [address] [place], [place] [address]     Medications:  No current facility-administered medications for this encounter.   Current Outpatient Medications  Medication Sig Dispense Refill  . fluticasone (FLONASE) 50 MCG/ACT nasal spray Place 1 spray into both nostrils daily. 16 g 0    Psychiatric Specialty Exam: Physical Exam  Review of Systems  Blood pressure 120/80, pulse 88, temperature 98 F (36.7 C), temperature source Oral, resp. rate 20, height 5\' 10"  (1.778 m), weight 90.7 kg, SpO2 100 %.Body mass index is 28.7 kg/m.  General Appearance: Disheveled  Eye Contact:  Poor  Speech:  Pressured  Volume:  Increased  Mood:  Irritable  Affect:  Congruent  Thought Process:  Coherent and Goal Directed  Orientation:  Full (Time, Place, and Person)  Thought Content:  Logical  Suicidal Thoughts:  Yes.  with intent/plan  Homicidal Thoughts:  No  Memory:  Immediate;   Fair Recent;   Fair Remote;   Fair  Judgement:  Fair  Insight:  Fair  Psychomotor Activity:  Decreased  Concentration:  Concentration: Fair and Attention Span: Fair  Recall:   [employer] of Knowledge:  Fair  Language:  Good  Akathisia:  No  Handed:  Right  AIMS (if indicated):     Assets:  Communication Skills Desire for Improvement Housing Vocational/Educational  ADL's:  Intact  Cognition:  WNL  Sleep:       Disposition: Recommend overnight observation at [facility] to restart medications and allow time for drugs to clear from system. Discussed with patient restarting Depakote, Remeron, propranolol and trazodone. Patient denies prior trials of Risperdal and agreeable to starting this medication due to reports of continued AVH on Abilify. Called and spoke with [facility] provider. ED staff updated.  This service was provided via telemedicine using a 2-way, interactive audio and video technology with the identified patient and this Clinical research associate.   Aldean Baker, NP 10/21/2020 4:57 PM

## 2020-10-21 NOTE — ED Provider Notes (Addendum)
[facility] EMERGENCY DEPARTMENT Provider Note   CSN: [id number] Arrival date & time: 10/21/20  2106     History Chief Complaint  Patient presents with   Covid Positive   [facility] transfer    Cory Floyd is a 44 y.o. male evaluated in this emergency department earlier today for psychiatric complaints of AVH / SI; medically cleared.  Please see previous ED provider note.  He was subsequently transferred to behavioral health urgent care after medical clearance in the emergency department, however patient was transferred back to [facility], ED due to positive COVID-19 test.  No current complaints at time a return to the emergency department.  Patient endorses being hungry, requesting for exam.  I personally reviewed this patient's medical records.  History of GERD, OSA, bipolar affective disorder, polysubstance abuse, SI.  HPI     Past Medical History:  Diagnosis Date   Alcoholic (HCC)    clean for 3 years   Anginal pain (HCC)    Arthritis    Bipolar 1 disorder (HCC)    COVID-19    Depressed    Diabetes mellitus without complication (HCC)    Dyspnea    Fatty liver    Hypertension    Schizophrenia [facility])     Patient Active Problem List   Diagnosis Date Noted   Substance induced mood disorder (HCC) 07/26/2020   Suicidal ideation    Polysubstance dependence (HCC) 07/25/2020   Bipolar affective disorder, depressed, moderate (HCC) 03/06/2020   Arthritis 09/30/2019   Gastroesophageal reflux disease without esophagitis 09/21/2016   Obstructive sleep apnea 09/21/2016   Morbid obesity with BMI of 40.0-44.9, adult (HCC) 08/15/2016   Stable angina pectoris (HCC) 08/15/2016    Past Surgical History:  Procedure Laterality Date   COLONOSCOPY  02/05/2014   diverticulosis, hyperplastic polyp x 2   fracture arm Right    KNEE SURGERY Right 2013   NASAL SEPTUM SURGERY     NASAL TURBINATE REDUCTION Bilateral 12/12/2016   Procedure:  TURBINATE REDUCTION/SUBMUCOSAL RESECTION;  Surgeon: Linus Salmons, MD;  Location: [facility] ORS;  Service: ENT;  Laterality: Bilateral;       Family History  Problem Relation Age of Onset   Hepatitis C Mother    Cancer Mother    Cancer Maternal Aunt    Hypertension Maternal Grandmother     Social History   Tobacco Use   Smoking status: Current Every Day Smoker    Packs/day: 1.00    Years: 20.00    Pack years: 20.00    Types: Cigarettes   Smokeless tobacco: Never Used  Vaping Use   Vaping Use: Never used  Substance Use Topics   Alcohol use: Yes    Comment: 1/2-5th pint of liquor   Drug use: Yes    Types: Cocaine, Marijuana    Comment: heroin    Home Medications Prior to Admission medications   Medication Sig Start Date End Date Taking? Authorizing Provider  fluticasone (FLONASE) 50 MCG/ACT nasal spray Place 1 spray into both nostrils daily. 08/24/20   Petrucelli, Samantha R, [name]  ARIPiprazole (ABILIFY) 30 MG tablet Take 1 tablet (30 mg total) by mouth daily. Patient not taking: Reported on 03/02/2020 10/28/19 05/16/20  Money, Gerlene Burdock, FNP  divalproex (DEPAKOTE) 500 MG DR tablet Take 1 tablet (500 mg total) by mouth daily. Patient not taking: Reported on 05/16/2020 03/08/20 05/16/20  Charm Rings, NP    Allergies    Carrot [daucus carota] and Penicillins  Review of Systems  Review of Systems  Constitutional: Negative.   HENT: Negative.   Eyes: Negative.   Respiratory: Negative.   Cardiovascular: Negative.   Gastrointestinal: Negative.   Skin: Negative.   Neurological: Negative.   Hematological: Negative.   Psychiatric/Behavioral: Positive for hallucinations and suicidal ideas.    Physical Exam Updated Vital Signs BP 110/61    Pulse 86    Temp 98.5 F (36.9 C) (Oral)    Resp 19    Ht 5\' 10"  (1.778 m)    Wt 90.7 kg    SpO2 98%    BMI 28.70 kg/m   Physical Exam Vitals and nursing note reviewed.  HENT:     Head: Normocephalic and atraumatic.      Mouth/Throat:     Mouth: Mucous membranes are moist.     Pharynx: No oropharyngeal exudate or posterior oropharyngeal erythema.  Eyes:     General: No scleral icterus.       Right eye: No discharge.        Left eye: No discharge.     Conjunctiva/sclera: Conjunctivae normal.  Cardiovascular:     Rate and Rhythm: Normal rate and regular rhythm.     Pulses: Normal pulses.     Heart sounds: No murmur heard.   Pulmonary:     Effort: Pulmonary effort is normal. No respiratory distress.     Breath sounds: Normal breath sounds. No wheezing or rales.  Abdominal:     General: Bowel sounds are normal. There is no distension.     Palpations: Abdomen is soft.     Tenderness: There is no abdominal tenderness.  Musculoskeletal:        General: No deformity.     Cervical back: Neck supple.     Right lower leg: No edema.     Left lower leg: No edema.  Skin:    General: Skin is warm and dry.     Capillary Refill: Capillary refill takes less than 2 seconds.  Neurological:     General: No focal deficit present.     Mental Status: He is alert and oriented to person, place, and time. Mental status is at baseline.  Psychiatric:        Attention and Perception: He perceives auditory and visual hallucinations.        Mood and Affect: Mood normal. Affect is blunt.        Speech: Speech is rapid and pressured.        Thought Content: Thought content is paranoid. Thought content includes suicidal ideation.     ED Results / Procedures / Treatments   Labs (all labs ordered are listed, but only abnormal results are displayed) Labs Reviewed - No data to display  EKG None  Radiology   Procedures Procedures (including critical care time)  Medications Ordered in ED Medications - No data to display  ED Course  I have reviewed the triage vital signs and the nursing notes.  Pertinent labs & imaging results that were available during my care of the patient were reviewed by me and considered in  my medical decision making (see chart for details).    MDM Rules/Calculators/A&P                         44 year old male who returns to the emergency department after being declined transfer to [facility] due to positive COVID-19 test.  Patient resting comfortably in his hospital bed at time of my evaluation.  Currently eating dinner.  Requesting he be informed of any changes made to his medications.  TTS evaluation completed prior to my evaluation of the patient.  Psychiatric provider recommending observation overnight, with reevaluation in the morning.  Patient placed in psychiatric observation due to need for safe environment while undergoing psychiatric complication evaluation.  Patient not under IVC at this time.  Medications ordered by psychiatric provider documentation, as there are no home medications listed in the EMR, TTS consult placed for reevaluation in the morning.  Final Clinical Impression(s) / ED Diagnoses Final diagnoses:  None    Rx / DC Orders ED Discharge Orders    None       Sherrilee Gilles 10/21/20 2224    Sherrilee Gilles 10/21/20 2236    Cheryll Cockayne, MD 10/21/20 2305    Cheryll Cockayne, MD 10/21/20 [id number]    Cheryll Cockayne, MD 10/21/20 [phone]

## 2020-10-21 NOTE — ED Notes (Signed)
Pt placed in burgundy scrubs, belongings removed and security called to wand patient.

## 2020-10-22 DIAGNOSIS — U071 COVID-19: Secondary | ICD-10-CM | POA: Diagnosis not present

## 2020-10-22 MED ORDER — IBUPROFEN 400 MG PO TABS
600.0000 mg | ORAL_TABLET | Freq: Four times a day (QID) | ORAL | Status: DC | PRN
  Administered 2020-10-22: 600 mg via ORAL
  Filled 2020-10-22: qty 1

## 2020-10-22 NOTE — Consult Note (Addendum)
Telepsych Consultation   Location of Patient: [facility] Location of Provider: [facility]  Patient Identification: Cory Floyd MRN:  [id number] Principal Diagnosis: Substance induced mood disorder (HCC) Diagnosis:  Principal Problem:   Substance induced mood disorder (HCC) Active Problems:   Polysubstance dependence (HCC)   Total Time spent with patient: 30 minutes   Subjective: Cory Floyd is a 44 year old male who presented to [facility] with c/o  Panic attacks. He was being admitted to the [facility] when he subsequently tested positive for Covid and transferred to [facility] ER. At this time of the evaluation patient denies any new symptoms related to his mental health. He denies any panic attack, hallucinations, depressive symptoms, or suicidal ideations. When assessing for anxiety and panic attack, he states" I aint been up long up to say whether or not Im having. But right now no. " He denies any paranoia at this time. When assessing his goals after discharge, he reports he would like to make sure his ACT is contacted for medications. During the evaluation he is alert and oriented, calm and cooperative, appears to be engaging well with Clinical research associate.  At this time he does not appear to be exhibiting paranoia or responding to internal stimuli. He denies suicidal ideation, homicidal ideations and or auditory visual hallucinations.    HPI:  Cory Floyd is a 44 year old male who presents voluntary and unaccompanied to [facility]. Pt was a poor historian during the assessment. Clinician asked the pt, "what brought you to the hospital?" Pt reported, having panic attacks. Pt reported, his panic attacks depends in of what's going on. Pt reported,  seeing shadows of people up/down the wall and hearing voices telling him to kill himself. Pt denies, SI, HI, self-injurious behaviors.   Clinician was unable to assess the following: symptoms of depression, anxiety, mania; mood, judgment, reality testing,  abstraction, social functioning, stressors, coping ability, supports, skills deficits, religion, diet, hobbies, exercise, family history, childhood history, employment/work situation, if can contract for safety.   Pt presents sleeping with slurred speech. Clinician attempted to redirect the pt to engage I the assessment however pt continued sleeping. Pt's affect was flat. Pt's thought content was poor. UDS positive for Cocaine    Disposition: Patient to be psych cleared. Contact his ACT Team to advise them of his release from the ER,with recommendations to remain in quarantine per CDC guidelines for persons with COVID-19. ED staff updated.   Past Psychiatric History: Multiple prior ED visits psychiatric hospitalizations related to substance use  Risk to Self:   Risk to Others:   Prior Inpatient Therapy:   Prior Outpatient Therapy:    Past Medical History:  Past Medical History:  Diagnosis Date  . 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 Anginal pain (HCC)   . Arthritis   . Bipolar 1 disorder (HCC)   . COVID-19   . Depressed   . Diabetes mellitus without complication (HCC)   . Dyspnea   . Fatty liver   . Hypertension   . Schizophrenia [facility])     Past Surgical History:  Procedure Laterality Date  . COLONOSCOPY  02/05/2014   diverticulosis, hyperplastic polyp x 2  . fracture arm Right   . KNEE SURGERY Right 2013  . NASAL SEPTUM SURGERY    . NASAL TURBINATE REDUCTION Bilateral 12/12/2016   Procedure: TURBINATE REDUCTION/SUBMUCOSAL RESECTION;  Surgeon: Linus Salmons, MD;  Location: [facility] ORS;  Service: ENT;  Laterality: Bilateral;   Family  History:  Family History  Problem Relation Age of Onset  . Hepatitis C Mother   . Cancer Mother   . Cancer Maternal Aunt   . Hypertension Maternal Grandmother    Family Psychiatric  History: Unknown Social History:  Social History   Substance and Sexual Activity  Alcohol Use Yes   Comment: 1/2-5th pint of liquor     Social History    Substance and Sexual Activity  Drug Use Yes  . Types: Cocaine, Marijuana   Comment: heroin    Social History   Socioeconomic History  . Marital status: Divorced    Spouse name: Not on file  . Number of children: Not on file  . Years of education: Not on file  . Highest education level: Not on file  Occupational History  . Not on file  Tobacco Use  . Smoking status: Current Every Day Smoker    Packs/day: 1.00    Years: 20.00    Pack years: 20.00    Types: Cigarettes  . Smokeless tobacco: Never Used  Vaping Use  . Vaping Use: Never used  Substance and Sexual Activity  . Alcohol use: Yes    Comment: 1/2-5th pint of liquor  . Drug use: Yes    Types: Cocaine, Marijuana    Comment: heroin  . Sexual activity: Yes    Birth control/protection: None  Other Topics Concern  . Not on file  Social History Narrative  . Not on file   Social Determinants of Health   Financial Resource Strain: Not on file  Food Insecurity: Not on file  Transportation Needs: Not on file  Physical Activity: Not on file  Stress: Not on file  Social Connections: Not on file   Additional Social History:    Allergies:   Allergies  Allergen Reactions  . Carrot [Daucus Carota] Anaphylaxis and Rash  . Penicillins Anaphylaxis and Hives    Has patient had a PCN reaction causing immediate rash, facial/tongue/throat swelling, SOB or lightheadedness with hypotension: Yes Has patient had a PCN reaction causing severe rash involving mucus membranes or skin necrosis: No Has patient had a PCN reaction that required hospitalization Yes Has patient had a PCN reaction occurring within the last 10 years: No If all of the above answers are "NO", then may proceed with Cephalosporin use.     Labs:  Results for orders placed or performed during the hospital encounter of 10/21/20 (from the past 48 hour(s))  Comprehensive metabolic panel     Status: Abnormal   Collection Time: 10/21/20 10:25 AM  Result Value  Ref Range   Sodium 136 135 - 145 mmol/L   Potassium 3.6 3.5 - 5.1 mmol/L   Chloride 102 98 - 111 mmol/L   CO2 21 (L) 22 - 32 mmol/L   Glucose, Bld 179 (H) 70 - 99 mg/dL    Comment: Glucose reference range applies only to samples taken after fasting for at least 8 hours.   BUN 16 6 - 20 mg/dL   Creatinine, Ser 1.611.16 0.61 - 1.24 mg/dL   Calcium 9.1 8.9 - 09.610.3 mg/dL   Total Protein 7.5 6.5 - 8.1 g/dL   Albumin 4.3 3.5 - 5.0 g/dL   AST 29 15 - 41 U/L   ALT 24 0 - 44 U/L   Alkaline Phosphatase 66 38 - 126 U/L   Total Bilirubin 0.8 0.3 - 1.2 mg/dL   GFR, Estimated >04>60 >54>60 mL/min    Comment: (NOTE) Calculated using the CKD-EPI Creatinine Equation (2021)  Anion gap 13 5 - 15    Comment: Performed at [facility], [address] [place], [place] [address]  Ethanol     Status: None   Collection Time: 10/21/20 10:25 AM  Result Value Ref Range   Alcohol, Ethyl (B) <10 <10 mg/dL    Comment: (NOTE) Lowest detectable limit for serum alcohol is 10 mg/dL.  For medical purposes only. Performed at [facility], [address] [place], [place] [address]   Salicylate level     Status: Abnormal   Collection Time: 10/21/20 10:25 AM  Result Value Ref Range   Salicylate Lvl <7.0 (L) 7.0 - 30.0 mg/dL    Comment: Performed at [facility], [address], [place], [place] [address]  Acetaminophen level     Status: Abnormal   Collection Time: 10/21/20 10:25 AM  Result Value Ref Range   Acetaminophen (Tylenol), Serum <10 (L) 10 - 30 ug/mL    Comment: (NOTE) Therapeutic concentrations vary significantly. A range of 10-30 ug/mL  may be an effective concentration for many patients. However, some  are best treated at concentrations outside of this range. Acetaminophen concentrations >150 ug/mL at 4 hours after ingestion  and >50 ug/mL at 12 hours after ingestion are often associated with  toxic reactions.  Performed at [facility], [address], [place],  [place] [address]   cbc     Status: Abnormal   Collection Time: 10/21/20 10:25 AM  Result Value Ref Range   WBC 5.4 4.0 - 10.5 K/uL   RBC 4.22 4.22 - 5.81 MIL/uL   Hemoglobin 13.1 13.0 - 17.0 g/dL   HCT 33.3 (L) 54.5 - 62.5 %   MCV 91.9 80.0 - 100.0 fL   MCH 31.0 26.0 - 34.0 pg   MCHC 33.8 30.0 - 36.0 g/dL   RDW 63.8 93.7 - 34.2 %   Platelets 177 150 - 400 K/uL   nRBC 0.0 0.0 - 0.2 %    Comment: Performed at [facility], [address] [place], [place] [address]  Rapid urine drug screen (hospital performed)     Status: Abnormal   Collection Time: 10/21/20  3:59 PM  Result Value Ref Range   Opiates NONE DETECTED NONE DETECTED   Cocaine POSITIVE (A) NONE DETECTED   Benzodiazepines NONE DETECTED NONE DETECTED   Amphetamines NONE DETECTED NONE DETECTED   Tetrahydrocannabinol NONE DETECTED NONE DETECTED   Barbiturates NONE DETECTED NONE DETECTED    Comment: (NOTE) DRUG SCREEN FOR MEDICAL PURPOSES ONLY.  IF CONFIRMATION IS NEEDED FOR ANY PURPOSE, NOTIFY LAB WITHIN 5 DAYS.  LOWEST DETECTABLE LIMITS FOR URINE DRUG SCREEN Drug Class                     Cutoff (ng/mL) Amphetamine and metabolites    1000 Barbiturate and metabolites    200 Benzodiazepine                 200 Tricyclics and metabolites     300 Opiates and metabolites        300 Cocaine and metabolites        300 THC                            50 Performed at [facility], [address], [place], [place] [address]   Resp Panel by RT-PCR (Flu A&B, Covid) Nasopharyngeal Swab  Status: Abnormal   Collection Time: 10/21/20  6:06 PM   Specimen: Nasopharyngeal Swab; Nasopharyngeal(NP) swabs in vial transport medium  Result Value Ref Range   SARS Coronavirus 2 by RT PCR POSITIVE (A) NEGATIVE    Comment: EMAILED Nonnie Done RN 1910 10/21/20 A BROWNING (NOTE) SARS-CoV-2 target nucleic acids are DETECTED.  The SARS-CoV-2 RNA is generally detectable in upper respiratory specimens during the acute phase of  infection. Positive results are indicative of the presence of the identified virus, but do not rule out bacterial infection or co-infection with other pathogens not detected by the test. Clinical correlation with patient history and other diagnostic information is necessary to determine patient infection status. The expected result is Negative.  Fact Sheet for Patients: BloggerCourse.com  Fact Sheet for Healthcare Providers: SeriousBroker.it  This test is not yet approved or cleared by the [place] FDA and  has been authorized for detection and/or diagnosis of SARS-CoV-2 by FDA under an Emergency Use Authorization (EUA).  This EUA will remain in effect (meaning this test can be used) for the duration of  the COVI D-19 declaration under Section 564(b)(1) of the Act, 21 U.S.C. section 360bbb-3(b)(1), unless the authorization is terminated or revoked sooner.     Influenza A by PCR NEGATIVE NEGATIVE   Influenza B by PCR NEGATIVE NEGATIVE    Comment: (NOTE) The Xpert Xpress SARS-CoV-2/FLU/RSV plus assay is intended as an aid in the diagnosis of influenza from Nasopharyngeal swab specimens and should not be used as a sole basis for treatment. Nasal washings and aspirates are unacceptable for Xpert Xpress SARS-CoV-2/FLU/RSV testing.  Fact Sheet for Patients: BloggerCourse.com  Fact Sheet for Healthcare Providers: SeriousBroker.it  This test is not yet approved or cleared by the [place] FDA and has been authorized for detection and/or diagnosis of SARS-CoV-2 by FDA under an Emergency Use Authorization (EUA). This EUA will remain in effect (meaning this test can be used) for the duration of the COVID-19 declaration under Section 564(b)(1) of the Act, 21 U.S.C. section 360bbb-3(b)(1), unless the authorization is terminated or revoked.  Performed at [facility],  [address] [place], [place] [address]     Medications:  Current Facility-Administered Medications  Medication Dose Route Frequency Provider Last Rate Last Admin  . divalproex (DEPAKOTE) DR tablet 250 mg  250 mg Oral q morning - 10a Sponseller, Rebekah R, [name]   250 mg at 10/22/20 1040  . divalproex (DEPAKOTE) DR tablet 500 mg  500 mg Oral QHS Sponseller, Rebekah R, [name]   500 mg at 10/21/20 2324  . ibuprofen (ADVIL) tablet 600 mg  600 mg Oral Q6H PRN Jodi Geralds N, [name]   600 mg at 10/22/20 1040  . mirtazapine (REMERON) tablet 15 mg  15 mg Oral QHS Sponseller, Rebekah R, [name]   15 mg at 10/21/20 2324  . propranolol (INDERAL) tablet 10 mg  10 mg Oral BID Sponseller, Rebekah R, [name]   10 mg at 10/22/20 1040  . risperiDONE (RISPERDAL) tablet 1 mg  1 mg Oral QHS Sponseller, Rebekah R, [name]   1 mg at 10/21/20 2323   Current Outpatient Medications  Medication Sig Dispense Refill  . fluticasone (FLONASE) 50 MCG/ACT nasal spray Place 1 spray into both nostrils daily. 16 g 0    Psychiatric Specialty Exam: Physical Exam Vitals and nursing note reviewed.  Psychiatric:        Mood and Affect: Mood normal.        Behavior: Behavior normal.  Thought Content: Thought content normal.        Judgment: Judgment normal.   Physical exam deferred.   Review of Systems  Musculoskeletal: Positive for myalgias. Negative for arthralgias.  All other systems reviewed and are negative.   Blood pressure 113/66, pulse 80, temperature 97.7 F (36.5 C), temperature source Oral, resp. rate 18, height 5\' 10"  (1.778 m), weight 90.7 kg, SpO2 98 %.Body mass index is 28.7 kg/m.  General Appearance: Casual  Eye Contact:  Minimal  Speech:  Clear and Coherent and Normal Rate  Volume:  Normal  Mood:  Fine  Affect:  Appropriate and Congruent  Thought Process:  Coherent and Goal Directed  Orientation:  Full (Time, Place, and Person)  Thought Content:  Logical  Suicidal Thoughts:  not right now. I havent been  up long up to see.   Homicidal Thoughts:  Denies  Memory:  Immediate;   Good Recent;   Poor Remote;   Poor  Judgement:  Good  Insight:  Present  Psychomotor Activity:  Normal  Concentration:  Concentration: Good and Attention Span: Good  Recall:  Good  Fund of Knowledge:  Good  Language:  Fair  Akathisia:  Negative  Handed:  Right  AIMS (if indicated):     Assets:  Communication Skills Desire for Improvement Housing Leisure Time Physical Health Vocational/Educational  ADL's:  Intact  Cognition:  WNL  Sleep:       Disposition: Recommend psych clear at this time.   -Continue current medications as directed at this time.  -Contact [facility] to inform them of his discharge and coordinate assistance with medications.  -Patient is Covid positive. **optional (as he does have a place to stay) May benefit from SW assistance with placement in COVID hotel, if beds are available.   This service was provided via telemedicine using a 2-way, interactive audio and video technology with the identified patient and this .    Clinical research associate, FNP 10/22/2020 11:59 AM

## 2020-10-22 NOTE — ED Provider Notes (Signed)
Emergency Medicine Observation Re-evaluation Note  Cory Floyd is a 44 y.o. male, seen on rounds today.  Pt initially presented to the ED for complaints of Covid Positive and [facility] transfer  Patient was seen in the ED yesterday for hallucinations and SI, was initially accepted to behavioral health urgent care for observation and reevaluation in the morning, but after transport patient's Covid test returned positive so he was transferred back to the emergency department.  Despite being Covid positive he is essentially asymptomatic, denies any current cough, chest pain, shortness of breath, fevers or chills.  His O2 sats have remained normal.  Lab evaluation yesterday was overall unremarkable.  Plan: Reevaluation with psychiatry today.  Physical Exam  BP 113/66 (BP Location: Right Arm)   Pulse 80   Temp 97.7 F (36.5 C) (Oral)   Resp 18   Ht 5\' 10"  (1.778 m)   Wt 90.7 kg   SpO2 98%   BMI 28.70 kg/m  Physical Exam General: Laying in bed, well-appearing and in no acute distress Cardiac: Normal rate Lungs: Respirations equal and unlabored, patient able to speak in full sentences Psych: Currently denying SI, does not appear to be actively hallucinating  ED Course / MDM  EKG:    I have reviewed the labs performed to date as well as medications administered while in observation.  No recent changes in the last 24 hours.  Plan   Psychiatry has reevaluated patient this morning, at this time patient is psychiatrically cleared and appropriate for discharge.  Discussed this plan with patient.  Discussed that he did test positive for Covid, but at this point is essentially asymptomatic, provided return precautions and discussed supportive care should he develop symptoms.  Patient discharged home in good condition.   , [name] 10/22/20 1337    10/24/20, MD 10/23/20 [phone]

## 2020-10-22 NOTE — BH Assessment (Signed)
Comprehensive Clinical Assessment (CCA) Note  10/22/2020 Cory Floyd [id number]   Cory Floyd is a 44 year old male who presents voluntary and unaccompanied to [facility]. Pt was a poor historian during the assessment. Clinician asked the pt, "what brought you to the hospital?" Pt reported, having panic attacks. Pt reported, his panic attacks depends in of what's going on. Pt reported,  seeing shadows of people up/down the wall and hearing voices telling him to kill himself. Pt denies, SI, HI, self-injurious behaviors.   Clinician was unable to assess the following: symptoms of depression, anxiety, mania; mood, judgment, reality testing, abstraction, social functioning, stressors, coping ability, supports, skills deficits, religion, diet, hobbies, exercise, family history, childhood history, employment/work situation, if can contract for safety.   Pt presents sleeping with slurred speech. Clinician attempted to redirect the pt to engage I the assessment however pt continued sleeping. Pt's affect was flat. Pt's thought content was poor. Pt  Disposition: Melbourne Abts, [name] pt to be observed and reassessed by psychiatry. Pt was sent to [facility] due to being COVID positive pt was sent to [facility]. Disposition discussed with Andruw, RN via secure chat in Epic.   Diagnosis: Substance induced mood Disorder [facility])   Chief Complaint:  Chief Complaint  Patient presents with  . Covid Positive  . [facility] transfer   Visit Diagnosis:     CCA Screening, Triage and Referral (STR)  Patient Reported Information How did you hear about [place]? No data recorded Referral name: No data recorded Referral phone number: No data recorded  Whom do you see for routine medical problems? No data recorded Practice/Facility Name: No data recorded Practice/Facility Phone Number: No data recorded Name of Contact: No data recorded Contact Number: No data recorded Contact Fax Number: No data recorded Prescriber Name: No data  recorded Prescriber Address (if known): No data recorded  What Is the Reason for Your Visit/Call Today? No data recorded How Long Has This Been Causing You Problems? No data recorded What Do You Feel Would Help You the Most Today? No data recorded  Have You Recently Been in Any Inpatient Treatment (Hospital/Detox/Crisis Center/28-Day Program)? No data recorded Name/Location of Program/Hospital:No data recorded How Long Were You There? No data recorded When Were You Discharged? No data recorded  Have You Ever Received Services From [facility] Before? No data recorded Who Do You See at [facility]? No data recorded  Have You Recently Had Any Thoughts About Hurting Yourself? No data recorded Are You Planning to Commit Suicide/Harm Yourself At This time? No data recorded  Have you Recently Had Thoughts About Hurting Someone Karolee Ohs? No data recorded Explanation: No data recorded  Have You Used Any Alcohol or Drugs in the Past 24 Hours? No data recorded How Long Ago Did You Use Drugs or Alcohol? No data recorded What Did You Use and How Much? No data recorded  Do You Currently Have a Therapist/Psychiatrist? No data recorded Name of Therapist/Psychiatrist: No data recorded  Have You Been Recently Discharged From Any Office Practice or Programs? No data recorded Explanation of Discharge From Practice/Program: No data recorded    CCA Screening Triage Referral Assessment Type of Contact: No data recorded Is this Initial or Reassessment? No data recorded Date Telepsych consult ordered in [facility]:  09/26/2020  Time Telepsych consult ordered in [facility]:  2142   Patient Reported Information Reviewed? No data recorded Patient Left Without Being Seen? No data recorded Reason for Not Completing Assessment: No data recorded  Collateral Involvement: No  data recorded  Does Patient Have a Court Appointed Legal Guardian? No data recorded Name and Contact of Legal Guardian: NA  If Minor and Not  Living with Parent(s), Who has Custody? NA  Is CPS involved or ever been involved? Never  Is APS involved or ever been involved? Never   Patient Determined To Be At Risk for Harm To Self or Others Based on Review of Patient Reported Information or Presenting Complaint? No data recorded Method: No data recorded Availability of Means: No data recorded Intent: No data recorded Notification Required: No data recorded Additional Information for Danger to Others Potential: No data recorded Additional Comments for Danger to Others Potential: No data recorded Are There Guns or Other Weapons in Your Home? No data recorded Types of Guns/Weapons: No data recorded Are These Weapons Safely Secured?                            No data recorded Who Could Verify You Are Able To Have These Secured: No data recorded Do You Have any Outstanding Charges, Pending Court Dates, Parole/Probation? No data recorded Contacted To Inform of Risk of Harm To Self or Others: No data recorded  Location of Assessment: [facility] ED   Does Patient Present under Involuntary Commitment? No data recorded IVC Papers Initial File Date: No data recorded  [place] of Residence: No data recorded  Patient Currently Receiving the Following Services: No data recorded  Determination of Need: No data recorded  Options For Referral: No data recorded    CCA Biopsychosocial Intake/Chief Complaint:  Per EDP/PA note: "is a 44 y.o. male with a history of schizophrenia, diabetes, hypertension, bipolar disorder, substance abuse, who presents to the emergency department for evaluation of hallucinations. Patient reports that over the few weeks he has been having increasing hallucinations. He reports he hears voices and seeing shadows. He reports the voices say that they are out to get him. They do not tell him to hurt himself or hurt other people.  He states that he is always have these voices intermittently but over the past 2 weeks they have  become a lot more persistent and started to make him want to hurt himself to make the voices stop. He has not done anything in attempt to harm himself prior to arrival today but does report after being clean for quite a while he did consume a large amount of alcohol last night and also did some crack. He denies any other drug use.  Denies any homicidal ideations. Reports that he was previously on psychiatric medications, has been without them for a few weeks but did not feel like they were really giving him much benefit. He reports that he has had some pain in his low back and right knee intermittently, has had multiple surgeries on his right knee and reports some increasing pain over the anterior knee that he has fallen on a few times in the last month and back pain that is worse in the morning without associated loss of bowel or bladder control, no numbness or weakness in his lower extremities.  Denies chest pain, shortness of breath, cough or fever. No current abdominal pain or vomiting.  No other complaints today."  Current Symptoms/Problems: AVH, depression and anxiety.   Patient Reported Schizophrenia/Schizoaffective Diagnosis in Past: No data recorded  Strengths: Not assessed.  Preferences: Not assessed.  Abilities: Not assessed.   Type of Services Patient Feels are Needed: UTA   Initial  Clinical Notes/Concerns: No data recorded  Mental Health Symptoms Depression:  -- (UTA)   Duration of Depressive symptoms: No data recorded  Mania:  -- (UTA)   Anxiety:   -- (Panic attacks.)   Psychosis:  -- (UTA)   Duration of Psychotic symptoms: No data recorded  Trauma:  -- (UTA)   Obsessions:  -- (UTA)   Compulsions:  -- (UTA)   Inattention:  -- (UTA)   Hyperactivity/Impulsivity:  No data recorded  Oppositional/Defiant Behaviors:  No data recorded  Emotional Irregularity:  No data recorded  Other Mood/Personality Symptoms:  No data recorded   Mental Status Exam Appearance and  self-care  Stature:  Average   Weight:  Average weight   Clothing:  -- (Pt under covers.)   Grooming:  Normal   Cosmetic use:  None   Posture/gait:  Normal   Motor activity:  Not Remarkable   Sensorium  Attention:  -- (Pt was sleeping.)   Concentration:  -- (Pt was sleeeping.)   Orientation:  No data recorded  Recall/memory:  Defective in Immediate   Affect and Mood  Affect:  Flat   Mood:  No data recorded  Relating  Eye contact:  None (Pt sleeping.)   Facial expression:  -- (UTA)   Attitude toward examiner:  No data recorded  Thought and Language  Speech flow: Slurred   Thought content:  -- (Poor)   Preoccupation:  None   Hallucinations:  Auditory; Command (Comment)   Organization:  No data recorded  [employer] of Knowledge:  Poor   Intelligence:  Average   Abstraction:  -- (UTA)   Judgement:  -- (UTA)   Reality Testing:  -- (UTA)   Insight:  Poor   Decision Making:  -- (UTA)   Social Functioning  Social Maturity:  -- Industrial/product designer)   Social Judgement:  -- Industrial/product designer)   Stress  Stressors:  -- (UTA)   Coping Ability:  -- Industrial/product designer)   Skill Deficits:  -- Industrial/product designer)   Supports:  -- Industrial/product designer)     Religion: Religion/Spirituality Are You A Religious Person?:  Industrial/product designer)  Leisure/Recreation: Leisure / Recreation Do You Have Hobbies?:  (UTA)  Exercise/Diet: Exercise/Diet Do You Exercise?:  (UTA) Have You Gained or Lost A Significant Amount of Weight in the Past Six Months?:  (UTA) Do You Follow a Special Diet?:  (UTA) Do You Have Any Trouble Sleeping?:  (UTA)   CCA Employment/Education Employment/Work Situation: Employment / Work Situation Employment situation: On disability Why is patient on disability: UTA How long has patient been on disability: UTA Patient's job has been impacted by current illness:  (UTA) What is the longest time patient has a held a job?: UTA Where was the patient employed at that time?: UTA Has patient ever been in the  [employer]?:  Industrial/product designer)  Education: Education Is Patient Currently Attending School?:  (UTA) Did Garment/textile technologist From [employer]?:  (UTA) Did You Attend College?:  (UTA) Did You Attend Graduate School?:  (UTA)   CCA Family/Childhood History Family and Relationship History: Family history Marital status: Divorced Divorced, when?: UTA What types of issues is patient dealing with in the relationship?: UTA Additional relationship information: UTA What is your sexual orientation?: UTA Has your sexual activity been affected by drugs, alcohol, medication, or emotional stress?: UTA Does patient have children?: Yes How many children?: 2 How is patient's relationship with their children?: UTA  Childhood History:  Childhood History By whom was/is the patient raised?:  (UTA) Additional childhood history  information: UTA Description of patient's relationship with caregiver when they were a child: UTA Patient's description of current relationship with people who raised him/her: UTA How were you disciplined when you got in trouble as a child/adolescent?: UTA Does patient have siblings?:  (UTA) Did patient suffer any verbal/emotional/physical/sexual abuse as a child?:  (UTA) Did patient suffer from severe childhood neglect?:  (UTA) Has patient ever been sexually abused/assaulted/raped as an adolescent or adult?:  (UTA) Was the patient ever a victim of a crime or a disaster?:  (UTA) Witnessed domestic violence?:  (UTA) Has patient been affected by domestic violence as an adult?:  Industrial/product designer(UTA)  Child/Adolescent Assessment:     CCA Substance Use Alcohol/Drug Use: Alcohol / Drug Use Pain Medications: See MAR Prescriptions: See MAR Over the Counter: See MAR History of alcohol / drug use?: Yes Substance #1 Name of Substance 1: Cocaine. 1 - Age of First Use: UTA 1 - Amount (size/oz): UTA 1 - Frequency: UTA 1 - Duration: UTA 1 - Last Use / Amount: Yesterday. Substance #2 Name of Substance 2:  Marijuana. 2 - Age of First Use: UTA 2 - Amount (size/oz): UTA 2 - Frequency: UTA 2 - Duration: UTA 2 - Last Use / Amount: Yesterday.      ASAM's:  Six Dimensions of Multidimensional Assessment  Dimension 1:  Acute Intoxication and/or Withdrawal Potential:      Dimension 2:  Biomedical Conditions and Complications:      Dimension 3:  Emotional, Behavioral, or Cognitive Conditions and Complications:     Dimension 4:  Readiness to Change:     Dimension 5:  Relapse, Continued use, or Continued Problem Potential:     Dimension 6:  Recovery/Living Environment:     ASAM Severity Score:    ASAM Recommended Level of Treatment:     Substance use Disorder (SUD)    Recommendations for Services/Supports/Treatments: Recommendations for Services/Supports/Treatments Recommendations For Services/Supports/Treatments: Other (Comment) (Pt to be observed and reassessed by psychiatry.)  DSM5 Diagnoses: Patient Active Problem List   Diagnosis Date Noted  . Substance induced mood disorder (HCC) 07/26/2020  . Suicidal ideation   . Polysubstance dependence (HCC) 07/25/2020  . Bipolar affective disorder, depressed, moderate (HCC) 03/06/2020  . Arthritis 09/30/2019  . Gastroesophageal reflux disease without esophagitis 09/21/2016  . Obstructive sleep apnea 09/21/2016  . Morbid obesity with BMI of 40.0-44.9, adult (HCC) 08/15/2016  . Stable angina pectoris (HCC) 08/15/2016    Referrals to Alternative Service(s): Referred to Alternative Service(s):   Place:   Date:   Time:    Referred to Alternative Service(s):   Place:   Date:   Time:    Referred to Alternative Service(s):   Place:   Date:   Time:    Referred to Alternative Service(s):   Place:   Date:   Time:     Redmond Pullingreylese D Dontaye Hur, [facility]  Comprehensive Clinical Assessment (CCA) Screening, Triage and Referral Note  10/22/2020 Cory Curiaric E Floyd [id number]  Chief Complaint:  Chief Complaint  Patient presents with  . Covid Positive  . [facility]  transfer   Visit Diagnosis:   Patient Reported Information How did you hear about us? No data recorded  Referral name: No data recorded  Referral phone number: No data recorded Whom do you see for routine medical problems? No data recorded  Practice/Facility Name: No data recorded  Practice/Facility Phone Number: No data recorded  Name of Contact: No data recorded  Contact Number: No data recorded  Contact Fax Number: No data recorded  Prescriber Name: No data recorded  Prescriber Address (if known): No data recorded What Is the Reason for Your Visit/Call Today? No data recorded How Long Has This Been Causing You Problems? No data recorded Have You Recently Been in Any Inpatient Treatment (Hospital/Detox/Crisis Center/28-Day Program)? No data recorded  Name/Location of Program/Hospital:No data recorded  How Long Were You There? No data recorded  When Were You Discharged? No data recorded Have You Ever Received Services From [facility] Before? No data recorded  Who Do You See at [facility]? No data recorded Have You Recently Had Any Thoughts About Hurting Yourself? No data recorded  Are You Planning to Commit Suicide/Harm Yourself At This time?  No data recorded Have you Recently Had Thoughts About Hurting Someone Karolee Ohs? No data recorded  Explanation: No data recorded Have You Used Any Alcohol or Drugs in the Past 24 Hours? No data recorded  How Long Ago Did You Use Drugs or Alcohol?  No data recorded  What Did You Use and How Much? No data recorded What Do You Feel Would Help You the Most Today? No data recorded Do You Currently Have a Therapist/Psychiatrist? No data recorded  Name of Therapist/Psychiatrist: No data recorded  Have You Been Recently Discharged From Any Office Practice or Programs? No data recorded  Explanation of Discharge From Practice/Program:  No data recorded    CCA Screening Triage Referral Assessment Type of Contact: No data recorded  Is this Initial or  Reassessment? No data recorded  Date Telepsych consult ordered in [facility]:  09/26/2020   Time Telepsych consult ordered in [facility]:  2142  Patient Reported Information Reviewed? No data recorded  Patient Left Without Being Seen? No data recorded  Reason for Not Completing Assessment: No data recorded Collateral Involvement: No data recorded Does Patient Have a Court Appointed Legal Guardian? No data recorded  Name and Contact of Legal Guardian:  NA  If Minor and Not Living with Parent(s), Who has Custody? NA  Is CPS involved or ever been involved? Never  Is APS involved or ever been involved? Never  Patient Determined To Be At Risk for Harm To Self or Others Based on Review of Patient Reported Information or Presenting Complaint? No data recorded  Method: No data recorded  Availability of Means: No data recorded  Intent: No data recorded  Notification Required: No data recorded  Additional Information for Danger to Others Potential:  No data recorded  Additional Comments for Danger to Others Potential:  No data recorded  Are There Guns or Other Weapons in Your Home?  No data recorded   Types of Guns/Weapons: No data recorded   Are These Weapons Safely Secured?                              No data recorded   Who Could Verify You Are Able To Have These Secured:    No data recorded Do You Have any Outstanding Charges, Pending Court Dates, Parole/Probation? No data recorded Contacted To Inform of Risk of Harm To Self or Others: No data recorded Location of Assessment: [facility] ED  Does Patient Present under Involuntary Commitment? No data recorded  IVC Papers Initial File Date: No data recorded  [place] of Residence: No data recorded Patient Currently Receiving the Following Services: No data recorded  Determination of Need: No data recorded  Options For Referral: No data recorded  Redmond Pulling, [facility]     Caprice Renshaw  Kathlee Nations, MS, [facility], [facility] Triage Specialist [phone]

## 2020-10-22 NOTE — Discharge Instructions (Signed)
You have been psychiatrically cleared and can follow-up as an outpatient.  You did test positive for Covid, but at this time you are not experiencing symptoms and your oxygen levels have been normal. Please continue to quarantine at home and monitor your symptoms closely. Antibiotics are not helpful in treating viral infection, the virus should run its course in about 10-14 days. Please make sure you are drinking plenty of fluids. You can treat your symptoms supportively with tylenol for fevers and pains, and over the counter cough syrups and throat lozenges to help with cough. If your symptoms are not improving please follow up with you Primary doctor.   I recommend that you purchase a home pulse ox to help better monitor your oxygen at home, if you start to have increased work of breathing or shortness of breath or your oxygen drops below 90% please immediately return to the hospital for reevaluation.  If you develop persistent fevers, shortness of breath or difficulty breathing, chest pain, severe headache and neck pain, persistent nausea and vomiting or other new or concerning symptoms return to the Emergency department.

## 2020-10-24 ENCOUNTER — Encounter (HOSPITAL_COMMUNITY): Payer: Self-pay | Admitting: Emergency Medicine

## 2020-10-24 ENCOUNTER — Emergency Department (HOSPITAL_COMMUNITY): Payer: Medicare Other

## 2020-10-24 ENCOUNTER — Emergency Department (HOSPITAL_COMMUNITY)
Admission: EM | Admit: 2020-10-24 | Discharge: 2020-10-25 | Disposition: A | Discharge status: Home / Self Care | Payer: Medicare Other | Attending: Emergency Medicine | Admitting: Emergency Medicine

## 2020-10-24 DIAGNOSIS — F17210 Nicotine dependence, cigarettes, uncomplicated: Secondary | ICD-10-CM | POA: Diagnosis not present

## 2020-10-24 DIAGNOSIS — Z8616 Personal history of COVID-19: Secondary | ICD-10-CM | POA: Diagnosis not present

## 2020-10-24 DIAGNOSIS — E119 Type 2 diabetes mellitus without complications: Secondary | ICD-10-CM | POA: Insufficient documentation

## 2020-10-24 DIAGNOSIS — R0602 Shortness of breath: Secondary | ICD-10-CM | POA: Diagnosis present

## 2020-10-24 DIAGNOSIS — I10 Essential (primary) hypertension: Secondary | ICD-10-CM | POA: Diagnosis not present

## 2020-10-24 DIAGNOSIS — U071 COVID-19: Secondary | ICD-10-CM | POA: Diagnosis not present

## 2020-10-24 LAB — CBC
HCT: 41.9 % (ref 39.0–52.0)
Hemoglobin: 13.7 g/dL (ref 13.0–17.0)
MCH: 30 pg (ref 26.0–34.0)
MCHC: 32.7 g/dL (ref 30.0–36.0)
MCV: 91.9 fL (ref 80.0–100.0)
Platelets: 194 10*3/uL (ref 150–400)
RBC: 4.56 MIL/uL (ref 4.22–5.81)
RDW: 13.2 % (ref 11.5–15.5)
WBC: 6.6 10*3/uL (ref 4.0–10.5)
nRBC: 0 % (ref 0.0–0.2)

## 2020-10-24 LAB — BASIC METABOLIC PANEL
Anion gap: 10 (ref 5–15)
BUN: 13 mg/dL (ref 6–20)
CO2: 24 mmol/L (ref 22–32)
Calcium: 9.4 mg/dL (ref 8.9–10.3)
Chloride: 105 mmol/L (ref 98–111)
Creatinine, Ser: 0.9 mg/dL (ref 0.61–1.24)
GFR, Estimated: >60 mL/min (ref >60)
Glucose, Bld: 108 mg/dL — ABNORMAL HIGH (ref 70–99)
Potassium: 4.4 mmol/L (ref 3.5–5.1)
Sodium: 139 mmol/L (ref 135–145)

## 2020-10-24 LAB — TROPONIN I (HIGH SENSITIVITY): Troponin I (High Sensitivity): 7 ng/L (ref <18)

## 2020-10-24 NOTE — ED Triage Notes (Signed)
Patient BIB [facility] for COVID symptoms. Patient was tested several days and was positive but asymptomatic. Now patient complains of body aches. 18g saline lock in left wrist, received 250 mL NS from EMS PTA.

## 2020-10-24 NOTE — ED Triage Notes (Signed)
Patient complains of fatigue and dyspnea on exertion. Reports that he walked to and from a corner store this morning and when he tried to do so a second time he started feeling very short of breath and felt chest pressure.

## 2020-10-25 LAB — TROPONIN I (HIGH SENSITIVITY): Troponin I (High Sensitivity): 7 ng/L (ref <18)

## 2020-10-25 MED ORDER — ACETAMINOPHEN 500 MG PO TABS
1000.0000 mg | ORAL_TABLET | Freq: Once | ORAL | Status: AC
  Administered 2020-10-25: 1000 mg via ORAL
  Filled 2020-10-25: qty 2

## 2020-10-25 NOTE — ED Notes (Addendum)
This RN drew troponin lab from pt and sent to lab. Called main lab and requested that they used tube for troponin, per q2 hr order set. Main lab tech said that it will be processed upon arrival.

## 2020-10-25 NOTE — ED Notes (Signed)
E-signature pad unavailable at time of pt discharge. This RN discussed discharge materials with pt and answered all pt questions. Pt stated understanding of discharge material. ? ?

## 2020-10-25 NOTE — ED Notes (Signed)
Pt given sandwich bag, juice, water, and graham crackers

## 2020-10-25 NOTE — ED Notes (Signed)
Pt ambulated self around bed in exam room. Remained between 99 and 100% oxygen saturation on room air

## 2020-10-25 NOTE — Discharge Instructions (Addendum)
Take tylenol for fever, headaches, body aches. Drink plenty of fluids to prevent dehydration. You may continue to use other over-the-counter remedies for symptom control, if desired. Return for new or concerning symptoms such as worsening shortness of breath, coughing up blood, persistent vomiting, loss of consciousness.  

## 2020-10-25 NOTE — ED Provider Notes (Signed)
[facility] EMERGENCY DEPARTMENT Provider Note   CSN: [id number] Arrival date & time: 10/24/20  1322     History Chief Complaint  Patient presents with  . covid +    Cory Floyd is a 45 y.o. male.  45 year old male with a history of bipolar disorder, schizophrenia, alcohol abuse, polysubstance abuse, diabetes, hypertension presents to the emergency department for complaints of shortness of breath and chest pressure. Was seen in the emergency department 3 days ago for hallucinations and suicidal ideations. Tested positive on screening for COVID-19, but was asymptomatic at the time of testing. Reports that he has started to feel worse over the last 24 hours. Has been experiencing body aches and fatigue/malaise. Got up today to walk to the corner store to get groceries and felt winded while he was walking. Called EMS and was brought to the ED for reassessment. Continues to feel intermittent chest heaviness, as though he were punched in the chest. He has not taken any medications for his symptoms. Denies any illicit drug use since last evaluated; used cocaine prior to his last ED visit.         Past Medical History:  Diagnosis Date  . 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 Anginal pain (HCC)   . Arthritis   . Bipolar 1 disorder (HCC)   . COVID-19   . Depressed   . Diabetes mellitus without complication (HCC)   . Dyspnea   . Fatty liver   . Hypertension   . Schizophrenia [facility])     Patient Active Problem List   Diagnosis Date Noted  . Substance induced mood disorder (HCC) 07/26/2020  . Suicidal ideation   . Polysubstance dependence (HCC) 07/25/2020  . Bipolar affective disorder, depressed, moderate (HCC) 03/06/2020  . Arthritis 09/30/2019  . Gastroesophageal reflux disease without esophagitis 09/21/2016  . Obstructive sleep apnea 09/21/2016  . Morbid obesity with BMI of 40.0-44.9, adult (HCC) 08/15/2016  . Stable angina pectoris (HCC) 08/15/2016     Past Surgical History:  Procedure Laterality Date  . COLONOSCOPY  02/05/2014   diverticulosis, hyperplastic polyp x 2  . fracture arm Right   . KNEE SURGERY Right 2013  . NASAL SEPTUM SURGERY    . NASAL TURBINATE REDUCTION Bilateral 12/12/2016   Procedure: TURBINATE REDUCTION/SUBMUCOSAL RESECTION;  Surgeon: Linus Salmons, MD;  Location: [facility] ORS;  Service: ENT;  Laterality: Bilateral;       Family History  Problem Relation Age of Onset  . Hepatitis C Mother   . Cancer Mother   . Cancer Maternal Aunt   . Hypertension Maternal Grandmother     Social History   Tobacco Use  . Smoking status: Current Every Day Smoker    Packs/day: 1.00    Years: 20.00    Pack years: 20.00    Types: Cigarettes  . Smokeless tobacco: Never Used  Vaping Use  . Vaping Use: Never used  Substance Use Topics  . Alcohol use: Yes    Comment: 1/2-5th pint of liquor  . Drug use: Yes    Types: Cocaine, Marijuana    Comment: heroin    Home Medications Prior to Admission medications   Medication Sig Start Date End Date Taking? Authorizing Provider  fluticasone (FLONASE) 50 MCG/ACT nasal spray Place 1 spray into both nostrils daily. 08/24/20   Petrucelli, Samantha R, [name]  ARIPiprazole (ABILIFY) 30 MG tablet Take 1 tablet (30 mg total) by mouth daily. Patient not taking: Reported on 03/02/2020 10/28/19 05/16/20  Money, Lowry Ram, FNP  divalproex (DEPAKOTE) 500 MG DR tablet Take 1 tablet (500 mg total) by mouth daily. Patient not taking: Reported on 05/16/2020 03/08/20 05/16/20  Patrecia Pour, NP    Allergies    Carrot [daucus carota] and Penicillins  Review of Systems   Review of Systems  Ten systems reviewed and are negative for acute change, except as noted in the HPI.    Physical Exam Updated Vital Signs BP 97/70   Pulse 72   Temp 98.7 F (37.1 C) (Oral)   Resp 20   Ht 5\' 10"  (1.778 m)   Wt 88.5 kg   SpO2 98%   BMI 27.98 kg/m   Physical Exam Vitals and nursing note reviewed.   Constitutional:      General: He is not in acute distress.    Appearance: He is well-developed and well-nourished. He is not diaphoretic.     Comments: Nontoxic appearing and in NAD  HENT:     Head: Normocephalic and atraumatic.     Right Ear: External ear normal.     Left Ear: External ear normal.  Eyes:     General: No scleral icterus.    Extraocular Movements: EOM normal.     Conjunctiva/sclera: Conjunctivae normal.  Cardiovascular:     Rate and Rhythm: Normal rate and regular rhythm.     Pulses: Normal pulses.  Pulmonary:     Effort: Pulmonary effort is normal. No respiratory distress.     Breath sounds: No stridor.     Comments: Respirations even and unlabored. Speaking in full sentences. Musculoskeletal:        General: Normal range of motion.     Cervical back: Normal range of motion.  Skin:    General: Skin is warm and dry.     Coloration: Skin is not pale.     Findings: No erythema or rash.  Neurological:     Mental Status: He is alert and oriented to person, place, and time.     Coordination: Coordination normal.  Psychiatric:        Mood and Affect: Mood and affect normal.        Behavior: Behavior normal.     ED Results / Procedures / Treatments   Labs (all labs ordered are listed, but only abnormal results are displayed) Labs Reviewed  BASIC METABOLIC PANEL - Abnormal; Notable for the following components:      Result Value   Glucose, Bld 108 (*)    All other components within normal limits  CBC  TROPONIN I (HIGH SENSITIVITY)  TROPONIN I (HIGH SENSITIVITY)    EKG EKG Interpretation  Date/Time:  Monday October 25 2020 00:14:51 EST Ventricular Rate:  76 PR Interval:    QRS Duration: 94 QT Interval:  391 QTC Calculation: 440 R Axis:   43 Text Interpretation: Sinus rhythm ST elev, probable normal early repol pattern No acute changes Confirmed by Addison Lank [phone]) on 10/25/2020 12:47:00 AM   Radiology DG Chest Port 1 View  Result Date:  10/24/2020 CLINICAL DATA:  COVID positive chest pain EXAM: PORTABLE CHEST 1 VIEW COMPARISON:  09/26/2020 FINDINGS: The heart size and mediastinal contours are within normal limits. Both lungs are clear. The visualized skeletal structures are unremarkable. IMPRESSION: No active disease. Electronically Signed   By: Donavan Foil M.D.   On: 10/24/2020 16:02    Procedures Procedures (including critical care time)  Medications Ordered in ED Medications  acetaminophen (TYLENOL) tablet 1,000 mg (1,000 mg Oral Given  10/25/20 0013)    ED Course  I have reviewed the triage vital signs and the nursing notes.  Pertinent labs & imaging results that were available during my care of the patient were reviewed by me and considered in my medical decision making (see chart for details).  Clinical Course as of 10/25/20 0349  Mon Oct 25, 2020  0110 Pt ambulated self around bed in exam room. Remained between 99 and 100% oxygen saturation on room air. [KH]  0344 Patient resting comfortably. Has had two drinks and two snack bags. VSS. Plan for d/c. [KH]    Clinical Course User Index [KH] Antony Madura, [name]   MDM Rules/Calculators/A&P                          45 year old male presenting to the emergency department for complaints of chest heaviness and shortness of breath in setting of recent COVID-19 diagnosis.  He has nontoxic-appearing, afebrile.  Vitals have been stable.  No criteria for SIRS/sepsis.  While patient complaining of shortness of breath, he has no tachypnea, dyspnea, hypoxia.  Is able to ambulate in the ED without acute desaturation.  Chest x-ray without acute cardiopulmonary abnormality.  Laboratory evaluation unremarkable.  We will continue with outpatient supportive management and have encouraged follow-up with a primary care doctor.  Patient discharged in stable condition with no unaddressed concerns.  DENSEL KRONICK was evaluated in Emergency Department on 10/25/2020 for the symptoms described in  the history of present illness. He was evaluated in the context of the global COVID-19 pandemic, which necessitated consideration that the patient might be at risk for infection with the SARS-CoV-2 virus that causes COVID-19. Institutional protocols and algorithms that pertain to the evaluation of patients at risk for COVID-19 are in a state of rapid change based on information released by regulatory bodies including the CDC and federal and state organizations. These policies and algorithms were followed during the patient's care in the ED.   Final Clinical Impression(s) / ED Diagnoses Final diagnoses:  COVID-19 virus infection    Rx / DC Orders ED Discharge Orders    None       Antony Madura, [name] 10/25/20 0352    Nira Conn, MD 10/25/20 [phone]

## 2020-10-26 ENCOUNTER — Telehealth (HOSPITAL_COMMUNITY): Payer: Self-pay

## 2020-10-26 NOTE — Telephone Encounter (Signed)
Care Management - Follow Up [facility] Discharges   Writer attempted to make contact with patient today and was unsuccessful.  Writer was able to leave a HIPPA compliant voice message and will await callback.  Per chart review, patient currently receives services with [facility].

## 2020-10-29 ENCOUNTER — Telehealth: Payer: Self-pay | Admitting: Nurse Practitioner

## 2020-10-29 NOTE — Telephone Encounter (Signed)
[facility] ([phone]) called pt for HFU. LVM for return call to schedule HFU appt. 

## 2020-12-25 ENCOUNTER — Ambulatory Visit (INDEPENDENT_AMBULATORY_CARE_PROVIDER_SITE_OTHER): Payer: Medicare (Managed Care)

## 2020-12-25 ENCOUNTER — Other Ambulatory Visit: Payer: Self-pay

## 2020-12-25 ENCOUNTER — Encounter (HOSPITAL_COMMUNITY): Payer: Self-pay | Admitting: Emergency Medicine

## 2020-12-25 ENCOUNTER — Ambulatory Visit (HOSPITAL_COMMUNITY)
Admission: EM | Admit: 2020-12-25 | Discharge: 2020-12-25 | Disposition: A | Discharge status: Home / Self Care | Payer: Medicare (Managed Care) | Attending: Emergency Medicine | Admitting: Emergency Medicine

## 2020-12-25 DIAGNOSIS — M1990 Unspecified osteoarthritis, unspecified site: Secondary | ICD-10-CM

## 2020-12-25 DIAGNOSIS — M25561 Pain in right knee: Secondary | ICD-10-CM

## 2020-12-25 DIAGNOSIS — M25461 Effusion, right knee: Secondary | ICD-10-CM

## 2020-12-25 DIAGNOSIS — S86911A Strain of unspecified muscle(s) and tendon(s) at lower leg level, right leg, initial encounter: Secondary | ICD-10-CM

## 2020-12-25 DIAGNOSIS — Y9367 Activity, basketball: Secondary | ICD-10-CM | POA: Diagnosis not present

## 2020-12-25 MED ORDER — PREDNISONE 10 MG (21) PO TBPK: ORAL_TABLET | Freq: Every day | ORAL | 0 refills | Status: DC

## 2020-12-25 MED ORDER — IBUPROFEN 600 MG PO TABS: 600.0000 mg | ORAL_TABLET | Freq: Four times a day (QID) | ORAL | 0 refills | Status: DC | PRN

## 2020-12-25 NOTE — ED Triage Notes (Signed)
Pt states that he has swelling in his right knee. Pt states that he noticed the swelling and pain last night after playing basketball. . Pt described the pain as a sharp/ shooting pain going to his toes.

## 2020-12-25 NOTE — Discharge Instructions (Signed)
Rest your knee, use the brace for comfort. Ice 10-15 minutes up to 4 times a day. Take the ibuprofen as needed for pain relief.  Take the steroid taper as prescribed.  Follow up with orthopedics or sports medicine.   Return or go to the Emergency Department if symptoms worsen or do not improve in the next few days.

## 2020-12-25 NOTE — ED Provider Notes (Signed)
[facility] CARE CENTER   [id number] 12/25/20 Arrival Time: 1416  AY:TKZSW PAIN  SUBJECTIVE: History from: patient. Cory Floyd is a 45 y.o. male complains of right knee pain that began 1 day ago.  Denies a precipitating event or specific injury.  Localizes the pain to the right knee.  Describes the pain as constant and achy in character.  Has tried OTC medications without relief.  Symptoms are made worse with activity.  Denies similar symptoms in the past.  Denies fever, chills, erythema, ecchymosis, effusion, weakness, numbness and tingling, saddle paresthesias, loss of bowel or bladder function.      ROS: As per HPI.  All other pertinent ROS negative.     Past Medical History:  Diagnosis Date  . 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 Anginal pain (HCC)   . Arthritis   . Bipolar 1 disorder (HCC)   . COVID-19   . Depressed   . Diabetes mellitus without complication (HCC)   . Dyspnea   . Fatty liver   . Hypertension   . Schizophrenia [facility])    Past Surgical History:  Procedure Laterality Date  . COLONOSCOPY  02/05/2014   diverticulosis, hyperplastic polyp x 2  . fracture arm Right   . KNEE SURGERY Right 2013  . NASAL SEPTUM SURGERY    . NASAL TURBINATE REDUCTION Bilateral 12/12/2016   Procedure: TURBINATE REDUCTION/SUBMUCOSAL RESECTION;  Surgeon: Linus Salmons, MD;  Location: [facility] ORS;  Service: ENT;  Laterality: Bilateral;   Allergies  Allergen Reactions  . Carrot [Daucus Carota] Anaphylaxis and Rash  . Penicillins Anaphylaxis and Hives    Has patient had a PCN reaction causing immediate rash, facial/tongue/throat swelling, SOB or lightheadedness with hypotension: Yes Has patient had a PCN reaction causing severe rash involving mucus membranes or skin necrosis: No Has patient had a PCN reaction that required hospitalization Yes Has patient had a PCN reaction occurring within the last 10 years: No If all of the above answers are "NO", then may proceed with Cephalosporin  use.    No current facility-administered medications on file prior to encounter.   Current Outpatient Medications on File Prior to Encounter  Medication Sig Dispense Refill  . fluticasone (FLONASE) 50 MCG/ACT nasal spray Place 1 spray into both nostrils daily. 16 g 0  . [DISCONTINUED] ARIPiprazole (ABILIFY) 30 MG tablet Take 1 tablet (30 mg total) by mouth daily. (Patient not taking: Reported on 03/02/2020) 30 tablet 1  . [DISCONTINUED] divalproex (DEPAKOTE) 500 MG DR tablet Take 1 tablet (500 mg total) by mouth daily. (Patient not taking: Reported on 05/16/2020) 30 tablet 0   Social History   Socioeconomic History  . Marital status: Divorced    Spouse name: Not on file  . Number of children: Not on file  . Years of education: Not on file  . Highest education level: Not on file  Occupational History  . Not on file  Tobacco Use  . Smoking status: Current Every Day Smoker    Packs/day: 1.00    Years: 20.00    Pack years: 20.00    Types: Cigarettes  . Smokeless tobacco: Never Used  Vaping Use  . Vaping Use: Never used  Substance and Sexual Activity  . Alcohol use: Yes    Comment: 1/2-5th pint of liquor  . Drug use: Yes    Types: Cocaine, Marijuana    Comment: heroin  . Sexual activity: Yes    Birth control/protection: None  Other Topics Concern  .  Not on file  Social History Narrative  . Not on file   Social Determinants of Health   Financial Resource Strain: Not on file  Food Insecurity: Not on file  Transportation Needs: Not on file  Physical Activity: Not on file  Stress: Not on file  Social Connections: Not on file  Intimate Partner Violence: Not on file   Family History  Problem Relation Age of Onset  . Hepatitis C Mother   . Cancer Mother   . Cancer Maternal Aunt   . Hypertension Maternal Grandmother     OBJECTIVE:  Vitals:   12/25/20 1523  BP: 117/75  Pulse: 79  Resp: 19  Temp: (!) 97.1 F (36.2 C)  TempSrc: Temporal  SpO2: 100%    General  appearance: ALERT; in no acute distress.  Head: NCAT Lungs: Normal respiratory effort CV: pulses 2+ bilaterally. Cap refill < 2 seconds Musculoskeletal:  Inspection: Skin warm, dry, clear and intact No erythema or effusion Palpation: right knee tender to palpation ROM: Limited ROM active and passive to right knee Skin: warm and dry Neurologic: Ambulates without difficulty; Sensation intact about the upper/ lower extremities Psychological: alert and cooperative; normal mood and affect  DIAGNOSTIC STUDIES:  DG Knee AP/LAT W/Sunrise Right  Result Date: 12/25/2020 CLINICAL DATA:  Pain and swelling after playing basketball. No trauma history submitted. EXAM: RIGHT KNEE 3 VIEWS COMPARISON:  10/21/2020 FINDINGS: Significantly age advanced, moderate to severe medial and patellofemoral compartment joint space narrowing with osteophyte formation. Prior ACL repair. No joint effusion. No acute fracture or dislocation. IMPRESSION: Significantly age advanced osteoarthritis. No acute superimposed process. Electronically Signed   By: Jeronimo Greaves M.D.   On: 12/25/2020 16:26     ASSESSMENT & PLAN:  1. Acute pain of right knee   2. Strain of right knee, initial encounter   3. Arthritis      Meds ordered this encounter  Medications  . ibuprofen (ADVIL) 600 MG tablet    Sig: Take 1 tablet (600 mg total) by mouth every 6 (six) hours as needed.    Dispense:  30 tablet    Refill:  0    Order Specific Question:   Supervising Provider    Answer:   Merrilee Jansky [id number]  . predniSONE (STERAPRED UNI-PAK 21 TAB) 10 MG (21) TBPK tablet    Sig: Take by mouth daily. Take 6 tabs by mouth daily  for 2 days, then 5 tabs for 2 days, then 4 tabs for 2 days, then 3 tabs for 2 days, 2 tabs for 2 days, then 1 tab by mouth daily for 2 days    Dispense:  42 tablet    Refill:  0    Order Specific Question:   Supervising Provider    Answer:   Merrilee Jansky [[id number]]    Continue conservative management of  rest, ice, and gentle stretches Take ibuprofen as needed for pain relief (may cause abdominal discomfort, ulcers, and GI bleeds avoid taking with other NSAIDs) Take prednisone taper for inflammation Follow up with sports medicine or orthopedics if symptoms persist Return or go to the ER if you have any new or worsening symptoms (fever, chills, chest pain, abdominal pain, changes in bowel or bladder habits, pain radiating into lower legs)    Reviewed expectations re: course of current medical issues. Questions answered. Outlined signs and symptoms indicating need for more acute intervention. Patient verbalized understanding. After Visit Summary given.       Ivette Loyal, NP  12/25/20 1639  

## 2021-01-22 ENCOUNTER — Emergency Department (HOSPITAL_COMMUNITY)
Admission: EM | Admit: 2021-01-22 | Discharge: 2021-01-23 | Disposition: A | Discharge status: Other Acute Inpatient Hospital | Payer: Medicare (Managed Care) | Attending: Emergency Medicine | Admitting: Emergency Medicine

## 2021-01-22 ENCOUNTER — Encounter (HOSPITAL_COMMUNITY): Payer: Self-pay

## 2021-01-22 ENCOUNTER — Other Ambulatory Visit: Payer: Self-pay

## 2021-01-22 DIAGNOSIS — I10 Essential (primary) hypertension: Secondary | ICD-10-CM | POA: Insufficient documentation

## 2021-01-22 DIAGNOSIS — F17210 Nicotine dependence, cigarettes, uncomplicated: Secondary | ICD-10-CM | POA: Diagnosis not present

## 2021-01-22 DIAGNOSIS — F3132 Bipolar disorder, current episode depressed, moderate: Secondary | ICD-10-CM | POA: Diagnosis not present

## 2021-01-22 DIAGNOSIS — R45851 Suicidal ideations: Secondary | ICD-10-CM | POA: Diagnosis not present

## 2021-01-22 DIAGNOSIS — R000 Tachycardia, unspecified: Secondary | ICD-10-CM | POA: Insufficient documentation

## 2021-01-22 DIAGNOSIS — R451 Restlessness and agitation: Secondary | ICD-10-CM

## 2021-01-22 DIAGNOSIS — Z8616 Personal history of COVID-19: Secondary | ICD-10-CM | POA: Insufficient documentation

## 2021-01-22 DIAGNOSIS — Z20822 Contact with and (suspected) exposure to covid-19: Secondary | ICD-10-CM | POA: Diagnosis not present

## 2021-01-22 DIAGNOSIS — Z046 Encounter for general psychiatric examination, requested by authority: Secondary | ICD-10-CM | POA: Diagnosis present

## 2021-01-22 DIAGNOSIS — F319 Bipolar disorder, unspecified: Secondary | ICD-10-CM | POA: Insufficient documentation

## 2021-01-22 DIAGNOSIS — E119 Type 2 diabetes mellitus without complications: Secondary | ICD-10-CM | POA: Diagnosis not present

## 2021-01-22 LAB — CBC WITH DIFFERENTIAL/PLATELET
Abs Immature Granulocytes: 0.06 10*3/uL (ref 0.00–0.07)
Basophils Absolute: 0 10*3/uL (ref 0.0–0.1)
Basophils Relative: 0 %
Eosinophils Absolute: 0 10*3/uL (ref 0.0–0.5)
Eosinophils Relative: 0 %
HCT: 39.6 % (ref 39.0–52.0)
Hemoglobin: 13.1 g/dL (ref 13.0–17.0)
Immature Granulocytes: 1 %
Lymphocytes Relative: 6 %
Lymphs Abs: 0.7 10*3/uL (ref 0.7–4.0)
MCH: 30.5 pg (ref 26.0–34.0)
MCHC: 33.1 g/dL (ref 30.0–36.0)
MCV: 92.3 fL (ref 80.0–100.0)
Monocytes Absolute: 0.7 10*3/uL (ref 0.1–1.0)
Monocytes Relative: 7 %
Neutro Abs: 9.8 10*3/uL — ABNORMAL HIGH (ref 1.7–7.7)
Neutrophils Relative %: 86 %
Platelets: 214 10*3/uL (ref 150–400)
RBC: 4.29 MIL/uL (ref 4.22–5.81)
RDW: 13.4 % (ref 11.5–15.5)
WBC: 11.3 10*3/uL — ABNORMAL HIGH (ref 4.0–10.5)
nRBC: 0 % (ref 0.0–0.2)

## 2021-01-22 LAB — COMPREHENSIVE METABOLIC PANEL
ALT: 32 U/L (ref 0–44)
AST: 36 U/L (ref 15–41)
Albumin: 4.4 g/dL (ref 3.5–5.0)
Alkaline Phosphatase: 61 U/L (ref 38–126)
Anion gap: 10 (ref 5–15)
BUN: 19 mg/dL (ref 6–20)
CO2: 23 mmol/L (ref 22–32)
Calcium: 9.2 mg/dL (ref 8.9–10.3)
Chloride: 107 mmol/L (ref 98–111)
Creatinine, Ser: 1.04 mg/dL (ref 0.61–1.24)
GFR, Estimated: >60 mL/min (ref >60)
Glucose, Bld: 101 mg/dL — ABNORMAL HIGH (ref 70–99)
Potassium: 3.7 mmol/L (ref 3.5–5.1)
Sodium: 140 mmol/L (ref 135–145)
Total Bilirubin: 0.7 mg/dL (ref 0.3–1.2)
Total Protein: 7.5 g/dL (ref 6.5–8.1)

## 2021-01-22 LAB — RESP PANEL BY RT-PCR (FLU A&B, COVID) ARPGX2
Influenza A by PCR: NEGATIVE
Influenza B by PCR: NEGATIVE
SARS Coronavirus 2 by RT PCR: NEGATIVE

## 2021-01-22 LAB — ETHANOL: Alcohol, Ethyl (B): <10 mg/dL (ref <10)

## 2021-01-22 MED ORDER — ZIPRASIDONE MESYLATE 20 MG IM SOLR
10.0000 mg | Freq: Once | INTRAMUSCULAR | Status: AC
  Administered 2021-01-22: 10 mg via INTRAMUSCULAR
  Filled 2021-01-22: qty 20

## 2021-01-22 MED ORDER — STERILE WATER FOR INJECTION IJ SOLN
INTRAMUSCULAR | Status: AC
  Administered 2021-01-22: 10 mL
  Filled 2021-01-22: qty 10
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
## 2021-01-22 NOTE — ED Notes (Addendum)
[place] Emergency planning/management officer arrived at bedside until further notice regarding a sitter

## 2021-01-22 NOTE — ED Notes (Signed)
[name] police offer Cory Floyd and his partner states that because the pt has not been officially served [name] they are unable to stay.  Charge nurse aware pt is in front of nurses station.

## 2021-01-22 NOTE — BHH Counselor (Signed)
TTS attempted to assess patient. Patient was hard to understand when speaking. He would fall asleep and immediately start snoring. TTS called patient named several times. He would respond yes then fall back asleep. Before TTS attempted to see patient two police officers had to awaken patient. TTS will attempt to re-assess patient once he awoke an able to participate in TTS assessment.

## 2021-01-22 NOTE — ED Notes (Signed)
2 Event organiser at bedside, no Location manager available

## 2021-01-22 NOTE — ED Provider Notes (Signed)
[facility] EMERGENCY DEPARTMENT Provider Note   CSN: [id number] Arrival date & time: 01/22/21  1357     History Chief Complaint  Patient presents with  . Medical Clearance    Cory Floyd is a 45 y.o. male.  Patient is a 45 year old male with a history of polysubstance abuse, SI, bipolar disease and possible schizophrenia who takes no medications regularly at home and today is being brought in by police.  Patient was found to be at a boardinghouse where he used to live damaging the property.  When police arrived patient jumped out of the window falling approximately 10 to 12 feet.  He grabbed multiple things on the way down and landed softly on the ground.  He has been extremely paranoid since police picked him up.  Patient is reporting that they are going to kill him, he is a dead man anyway so he might as well just kill himself, he can see them coming for him and nobody can stop them and he would just like to leave now.  When asked directly if he is going to hurt himself he says no but then makes a comment that he might as well just kill himself because he is dead anyway.  He does admit to using using drugs in the last 24 hours but will not say what they were.  He denies any recent alcohol use.  He denies any injury from jumping out the window.  Based on prior records in the past patient has been on Abilify and Depakote but last time he had those were over a year ago.  The history is provided by the patient and the police.       Past Medical History:  Diagnosis Date  . 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 Anginal pain (HCC)   . Arthritis   . Bipolar 1 disorder (HCC)   . COVID-19   . Depressed   . Diabetes mellitus without complication (HCC)   . Dyspnea   . Fatty liver   . Hypertension   . Schizophrenia [facility])     Patient Active Problem List   Diagnosis Date Noted  . Substance induced mood disorder (HCC) 07/26/2020  . Suicidal ideation   .  Polysubstance dependence (HCC) 07/25/2020  . Bipolar affective disorder, depressed, moderate (HCC) 03/06/2020  . Arthritis 09/30/2019  . Gastroesophageal reflux disease without esophagitis 09/21/2016  . Obstructive sleep apnea 09/21/2016  . Morbid obesity with BMI of 40.0-44.9, adult (HCC) 08/15/2016  . Stable angina pectoris (HCC) 08/15/2016    Past Surgical History:  Procedure Laterality Date  . COLONOSCOPY  02/05/2014   diverticulosis, hyperplastic polyp x 2  . fracture arm Right   . KNEE SURGERY Right 2013  . NASAL SEPTUM SURGERY    . NASAL TURBINATE REDUCTION Bilateral 12/12/2016   Procedure: TURBINATE REDUCTION/SUBMUCOSAL RESECTION;  Surgeon: Linus Salmons, MD;  Location: [facility] ORS;  Service: ENT;  Laterality: Bilateral;       Family History  Problem Relation Age of Onset  . Hepatitis C Mother   . Cancer Mother   . Cancer Maternal Aunt   . Hypertension Maternal Grandmother     Social History   Tobacco Use  . Smoking status: Current Every Day Smoker    Packs/day: 1.00    Years: 20.00    Pack years: 20.00    Types: Cigarettes  . Smokeless tobacco: Never Used  Vaping Use  . Vaping Use: Never used  Substance  Use Topics  . Alcohol use: Yes    Comment: 1/2-5th pint of liquor  . Drug use: Yes    Types: Cocaine, Marijuana    Comment: heroin    Home Medications Prior to Admission medications   Medication Sig Start Date End Date Taking? Authorizing Provider  fluticasone (FLONASE) 50 MCG/ACT nasal spray Place 1 spray into both nostrils daily. 08/24/20   Petrucelli, Samantha R, [name]  ibuprofen (ADVIL) 600 MG tablet Take 1 tablet (600 mg total) by mouth every 6 (six) hours as needed. 12/25/20   Ivette Loyal, NP  predniSONE (STERAPRED UNI-PAK 21 TAB) 10 MG (21) TBPK tablet Take by mouth daily. Take 6 tabs by mouth daily  for 2 days, then 5 tabs for 2 days, then 4 tabs for 2 days, then 3 tabs for 2 days, 2 tabs for 2 days, then 1 tab by mouth daily for 2 days 12/25/20    Ivette Loyal, NP  ARIPiprazole (ABILIFY) 30 MG tablet Take 1 tablet (30 mg total) by mouth daily. Patient not taking: Reported on 03/02/2020 10/28/19 05/16/20  Money, Gerlene Burdock, FNP  divalproex (DEPAKOTE) 500 MG DR tablet Take 1 tablet (500 mg total) by mouth daily. Patient not taking: Reported on 05/16/2020 03/08/20 05/16/20  Charm Rings, NP    Allergies    Carrot [daucus carota] and Penicillins  Review of Systems   Review of Systems  Unable to perform ROS: Psychiatric disorder  All other systems reviewed and are negative.   Physical Exam Updated Vital Signs BP (!) 122/97 (BP Location: Left Arm)   Pulse (!) 126   Temp 98.2 F (36.8 C) (Oral)   Resp (!) 22   SpO2 98%   Physical Exam Vitals and nursing note reviewed.  Constitutional:      General: He is not in acute distress.    Appearance: He is well-developed.  HENT:     Head: Normocephalic and atraumatic.     Comments: No signs of trauma to the head    Mouth/Throat:     Mouth: Mucous membranes are moist.  Eyes:     Conjunctiva/sclera: Conjunctivae normal.     Pupils: Pupils are equal, round, and reactive to light.  Cardiovascular:     Rate and Rhythm: Regular rhythm. Tachycardia present.     Heart sounds: No murmur heard.   Pulmonary:     Effort: Pulmonary effort is normal. No respiratory distress.     Breath sounds: Normal breath sounds. No wheezing or rales.  Abdominal:     General: There is no distension.     Palpations: Abdomen is soft.     Tenderness: There is no abdominal tenderness. There is no guarding or rebound.  Musculoskeletal:        General: No tenderness. Normal range of motion.     Cervical back: Normal range of motion and neck supple.     Comments: Minimal contusion over the left tricep.  No other evidence of injury on the upper or lower extremities  Skin:    General: Skin is warm and dry.     Findings: No erythema or rash.  Neurological:     Mental Status: He is alert and oriented to  person, place, and time.  Psychiatric:        Attention and Perception: He perceives visual hallucinations.        Mood and Affect: Affect is labile and inappropriate.        Speech: Speech is rapid and pressured  and tangential.        Behavior: Behavior is agitated.        Thought Content: Thought content is paranoid and delusional. Thought content includes suicidal ideation.        Cognition and Memory: Cognition is impaired.        Judgment: Judgment is impulsive.     Comments: Patient is looking around continually saying he can see them and they are going to kill him.  He is a dead man.  He might as well kill himself because he is already dead     ED Results / Procedures / Treatments   Labs (all labs ordered are listed, but only abnormal results are displayed) Labs Reviewed  CBC WITH DIFFERENTIAL/PLATELET  COMPREHENSIVE METABOLIC PANEL  ETHANOL  RAPID URINE DRUG SCREEN, HOSP PERFORMED    EKG None  Radiology No results found.  Procedures Procedures   Medications Ordered in ED Medications  ziprasidone (GEODON) injection 10 mg (has no administration in time range)    ED Course  I have reviewed the triage vital signs and the nursing notes.  Pertinent labs & imaging results that were available during my care of the patient were reviewed by me and considered in my medical decision making (see chart for details).    MDM Rules/Calculators/A&P                          Patient presenting with police after being found destroying property and jumped out of window.  Patient has no obvious signs of injury.  He is walking without significant pain.  He had no loss of consciousness in the incident was witnessed and he had no injury to the head.  He has minor contusions of the left arm but full range of motion of all extremities.  Patient is extremely paranoid at this time and thinks that somebody is going to kill him.  He even reports that he is going to kill himself because he is  already dead.  Suspect substance use heightening paranoia but also has underlying mental illness.  Patient is agitated but is fairly directable he is cooperative.  However he does not want to stay and do not feel that he is safe for discharge at this time.  Patient was given IM Haldol.  Labs and TTS are pending.  Final Clinical Impression(s) / ED Diagnoses Final diagnoses:  None    Rx / DC Orders ED Discharge Orders    None       Gwyneth Sprout, MD 01/22/21 2129

## 2021-01-22 NOTE — ED Notes (Signed)
This RN called staffing to get update on sitter. This RN was notified that there would most likely not be a sitter available.[employer] to remain at bedside for the time being.

## 2021-01-22 NOTE — BH Assessment (Signed)
Attempted TTS 7:47pm--per pt RN pt is too sleepy to participate at this time.

## 2021-01-22 NOTE — ED Notes (Signed)
[employer] sales engineer for hospital verified pt. Wallet contents and locked up

## 2021-01-23 ENCOUNTER — Other Ambulatory Visit: Payer: Self-pay

## 2021-01-23 ENCOUNTER — Ambulatory Visit (HOSPITAL_COMMUNITY)
Admission: EM | Admit: 2021-01-23 | Discharge: 2021-01-24 | Disposition: A | Discharge status: Other Acute Inpatient Hospital | Payer: No Typology Code available for payment source | Attending: Psychiatry | Admitting: Psychiatry

## 2021-01-23 DIAGNOSIS — R45851 Suicidal ideations: Secondary | ICD-10-CM

## 2021-01-23 DIAGNOSIS — Z9151 Personal history of suicidal behavior: Secondary | ICD-10-CM | POA: Insufficient documentation

## 2021-01-23 DIAGNOSIS — F1410 Cocaine abuse, uncomplicated: Secondary | ICD-10-CM

## 2021-01-23 DIAGNOSIS — F319 Bipolar disorder, unspecified: Secondary | ICD-10-CM

## 2021-01-23 DIAGNOSIS — F17210 Nicotine dependence, cigarettes, uncomplicated: Secondary | ICD-10-CM | POA: Insufficient documentation

## 2021-01-23 LAB — RAPID URINE DRUG SCREEN, HOSP PERFORMED
Amphetamines: NOT DETECTED
Barbiturates: NOT DETECTED
Benzodiazepines: NOT DETECTED
Cocaine: POSITIVE — AB
Opiates: NOT DETECTED
Tetrahydrocannabinol: NOT DETECTED

## 2021-01-23 LAB — LIPID PANEL
Cholesterol: 187 mg/dL (ref 0–200)
HDL: 70 mg/dL (ref >40)
LDL Cholesterol: 105 mg/dL — ABNORMAL HIGH (ref 0–99)
Total CHOL/HDL Ratio: 2.7 RATIO
Triglycerides: 61 mg/dL (ref <150)
VLDL: 12 mg/dL (ref 0–40)

## 2021-01-23 LAB — HEMOGLOBIN A1C
Hgb A1c MFr Bld: 5.8 % — ABNORMAL HIGH (ref 4.8–5.6)
Mean Plasma Glucose: 119.76 mg/dL

## 2021-01-23 LAB — VALPROIC ACID LEVEL: Valproic Acid Lvl: <10 ug/mL — ABNORMAL LOW (ref 50.0–100.0)

## 2021-01-23 LAB — TSH: TSH: 1.053 u[IU]/mL (ref 0.350–4.500)

## 2021-01-23 MED ORDER — TRAZODONE HCL 50 MG PO TABS: 50.0000 mg | ORAL_TABLET | Freq: Every evening | ORAL | Status: DC | PRN

## 2021-01-23 MED ORDER — MAGNESIUM HYDROXIDE 400 MG/5ML PO SUSP: 30.0000 mL | Freq: Every day | ORAL | Status: DC | PRN

## 2021-01-23 MED ORDER — ACETAMINOPHEN 325 MG PO TABS
650.0000 mg | ORAL_TABLET | Freq: Four times a day (QID) | ORAL | Status: DC | PRN
  Administered 2021-01-23 – 2021-01-24 (×4): 650 mg via ORAL
  Filled 2021-01-23 (×4): qty 2

## 2021-01-23 MED ORDER — ALUM & MAG HYDROXIDE-SIMETH 200-200-20 MG/5ML PO SUSP: 30.0000 mL | ORAL | Status: DC | PRN

## 2021-01-23 MED ORDER — HYDROXYZINE HCL 25 MG PO TABS
25.0000 mg | ORAL_TABLET | Freq: Three times a day (TID) | ORAL | Status: DC | PRN
  Administered 2021-01-23: 25 mg via ORAL
  Filled 2021-01-23: qty 1

## 2021-01-23 NOTE — ED Notes (Signed)
This RN called [employer] to transport pt to [facility]. They will send someone out.

## 2021-01-23 NOTE — ED Notes (Signed)
Report given to [facility] nurse

## 2021-01-23 NOTE — ED Notes (Signed)
Pt asleep with even and unlabored respirations. No distress or discomfort noted. Pt remains safe on the unit. Will continue to monitor. 

## 2021-01-23 NOTE — ED Notes (Signed)
Pt has been resting for most of the morning and early afternoon. Pt ate breakfast and lunch and ambulated to the restroom with no issues. Pt has been cooperative on the unit. Education, support and encouragement provided. Pt remains safe on the unit. Will continue to monitor.

## 2021-01-23 NOTE — BH Assessment (Addendum)
Comprehensive Clinical Assessment (CCA) Note  01/23/2021 Cory Floyd [id number]  Recommendations for Services/Supports/Treatments: Cecilio Asper, NP, reviewed pt's chart and information and determined pt should be observed overnight for safety and stability and re-assessed in the morning by psychiatry. This information was relayed to pt's nurse, Julieta Gutting, at [phone].  The patient demonstrates the following risk factors for suicide: Chronic risk factors for suicide include: psychiatric disorder of Bipolar DIsorder, substance use disorder, previous suicide attempts (last incident unknown), previous self-harm (specifics unknown, though pt states he has harmed himself "all over." and demographic factors (male, >27 y/o). Acute risk factors for suicide include: family or marital conflict, unemployment and loss (financial, interpersonal, professional). Protective factors for this patient include: none known. Considering these factors, the overall suicide risk at this point appears to be high. Patient is not appropriate for outpatient follow up.  Therefore, a 1:1 sitter is recommended for suicide precautions. This information was provided to pt's nurse, Alcario Drought RN, at 0300.  Flowsheet Row ED from 01/22/2021 in [facility] EMERGENCY DEPARTMENT ED from 12/25/2020 in [facility] at [facility] ED from 10/21/2020 in [facility] EMERGENCY DEPARTMENT  C-SSRS RISK CATEGORY High Risk No Risk Error: Question 6 not populated      Recommendations for Services/Supports/Treatments: Cecilio Asper, NP, reviewed pt's chart and information and determined pt should be observed overnight for safety and stability and re-assessed in the morning by psychiatry. This information was relayed to pt's nurse, Julieta Gutting, at [phone].  Chief Complaint:  Chief Complaint  Patient presents with  . Medical Clearance   Visit Diagnosis: F31.9, Bipolar I disorder, Current or most recent episode  unspecified   CCA Screening, Triage and Referral (STR) Cory Floyd is a 45 year old patient who was brought to the [facility] via the [employer] after pt was found to be damaging property at a boarding house where pt had previously lived. According to the EDP's note, when police arrived pt jumped out the window, falling approximately 10 - 12 feet, though he apparently sustained no injuries with the exception of some cuts on one of his arms. Pt shares he was brought to the hospital because, "I quit an employer b/c I'm owed money and he didn't care if you stayed high. He set me up and I ended up being around people and getting high which I hadn't done in weeks. I silently called 911."  Pt acknowledges SI and a hx of SI. He confirms he's attempted to kill himself in the past but was unable to state how many times or when the last attempt was. Pt shares he's been hospitalized numerous times and states his last hospitalization was several months ago. He shares his hospitalization have increased since his mother died in 01-20-2017. Pt fell asleep and was unable to identify whether he had a plan to kill himself.  Pt acknowledged HI but would not provide information re: whom this person is. He states he has engaged in NSSIB "all over" but would not share what he does to harm himself. Pt states the last time he engaged in NSSIB was when he was in the hospital, which, according to pt's chart, was 03/04/2020, though it's unclear if he's been hospitalized elsewhere since that time.  Pt was sleepy throughout the assessment and required multiple re-directions to answer questions; he became irritated at one point and told clinician it was unnecessary to continue with the assessment as he's going to have to repeat himself again later and  then he'll be d/c. Clinician requested pt answer several more questions but, after several attempts to get pt to answer, stated TTS would talk with him later.  Pt's orientation and memory were UTA.  Pt was irritable and sleepy throughout the assessment process. Pt's insight, judgement, and impulse control is poor at this time.    Patient Reported Information How did you hear about [place]? Other (Comment) (EDP)  Referral name: EDP  Referral phone number: 0 (N/A)   Whom do you see for routine medical problems? Other (Comment) (Urgent Care/ED)  Practice/Facility Name: No data recorded Practice/Facility Phone Number: No data recorded Name of Contact: No data recorded Contact Number: No data recorded Contact Fax Number: No data recorded Prescriber Name: No data recorded Prescriber Address (if known): No data recorded  What Is the Reason for Your Visit/Call Today? Pt states, "I quit an employer b/c I'm owed money and he didn't care if you stayed high. He set me up and I ended up being around people and getting high which I hadn't done in weeks. I silently called 911."  How Long Has This Been Causing You Problems? <Week  What Do You Feel Would Help You the Most Today? -- (Pt states, "You're just going to release me tomorrow, I know how this works.")   Have You Recently Been in Any Inpatient Treatment (Hospital/Detox/Crisis Center/28-Day Program)? No  Name/Location of Program/Hospital:No data recorded How Long Were You There? No data recorded When Were You Discharged? No data recorded  Have You Ever Received Services From [facility] Before? Yes  Who Do You See at [facility]? Various providers at the [facility], EDs, and Urgent Care   Have You Recently Had Any Thoughts About Hurting Yourself? Yes  Are You Planning to Commit Suicide/Harm Yourself At This time? -- (Pt states he might as well kill himself as he's already a dead man.)   Have you Recently Had Thoughts About Hurting Someone Cory Floyd? Yes  Explanation: Pt would give no specifics, just that he has thoughts of harming someone and plans to follow-through with it.   Have You Used Any Alcohol or Drugs in the Past 24 Hours? --  (Pt continuously fell asleep throughout the assessment and was argumentative when woken up. No lab results are available at this time.)  How Long Ago Did You Use Drugs or Alcohol? No data recorded What Did You Use and How Much? No data recorded  Do You Currently Have a Therapist/Psychiatrist? -- (Pt continuously fell asleep throughout the assessment and was argumentative when woken up. No lab results are available at this time.)  Name of Therapist/Psychiatrist: No data recorded  Have You Been Recently Discharged From Any Office Practice or Programs? -- (Pt continuously fell asleep throughout the assessment and was argumentative when woken up. No lab results are available at this time.)  Explanation of Discharge From Practice/Program: No data recorded    CCA Screening Triage Referral Assessment Type of Contact: Tele-Assessment  Is this Initial or Reassessment? Initial Assessment  Date Telepsych consult ordered in [facility]:  01/22/2021  Time Telepsych consult ordered in [facility]:  0202   Patient Reported Information Reviewed? Yes  Patient Left Without Being Seen? No data recorded Reason for Not Completing Assessment: No data recorded  Collateral Involvement: Pt did not provide verbal consent for clinician to make contact with friends/damily members   Does Patient Have a Court Appointed Legal Guardian? No data recorded Name and Contact of Legal Guardian: NA  If Minor and Not Living  with Parent(s), Who has Custody? N/A  Is CPS involved or ever been involved? -- (UTA)  Is APS involved or ever been involved? -- (UTA)   Patient Determined To Be At Risk for Harm To Self or Others Based on Review of Patient Reported Information or Presenting Complaint? -- (Both at risk for harm to self and harm to others)  Method: No data recorded Availability of Means: No data recorded Intent: No data recorded Notification Required: No data recorded Additional Information for Danger to Others Potential:  No data recorded Additional Comments for Danger to Others Potential: No data recorded Are There Guns or Other Weapons in Your Home? No data recorded Types of Guns/Weapons: No data recorded Are These Weapons Safely Secured?                            No data recorded Who Could Verify You Are Able To Have These Secured: No data recorded Do You Have any Outstanding Charges, Pending Court Dates, Parole/Probation? No data recorded Contacted To Inform of Risk of Harm To Self or Others: -- (Unable to make contact due to pt refusing to provide the name of the person he wants to kill.)   Location of Assessment: [facility] ED   Does Patient Present under Involuntary Commitment? No  IVC Papers Initial File Date: No data recorded  [place] of Residence: [place]   Patient Currently Receiving the Following Services: Not Receiving Services   Determination of Need: Urgent (48 hours)   Options For Referral: Medication Management; Outpatient Therapy     CCA Biopsychosocial Intake/Chief Complaint:  Per EDP note: "Patient is a 45 year old male with a history of polysubstance abuse, SI, bipolar disease and possible schizophrenia who takes no medications regularly at home and today is being brought in by police.  Patient was found to be at a boardinghouse where he used to live damaging the property.  When police arrived patient jumped out of the window falling approximately 10 to 12 feet.  He grabbed multiple things on the way down and landed softly on the ground.  He has been extremely paranoid since police picked him up.  Patient is reporting that they are going to kill him, he is a dead man anyway so he might as well just kill himself, he can see them coming for him and nobody can stop them and he would just like to leave now.  When asked directly if he is going to hurt himself he says no but then makes a comment that he might as well just kill himself because he is dead anyway.  He does admit to using using  drugs in the last 24 hours but will not say what they were.  He denies any recent alcohol use.  He denies any injury from jumping out the window.  Based on prior records in the past patient has been on Abilify and Depakote but last time he had those were over a year ago."  Current Symptoms/Problems: SI, HI   Patient Reported Schizophrenia/Schizoaffective Diagnosis in Past: Yes   Strengths: Not assessed.  Preferences: Not assessed.  Abilities: Not assessed.   Type of Services Patient Feels are Needed: UTA   Initial Clinical Notes/Concerns: N/A   Mental Health Symptoms Depression:  Hopelessness; Irritability   Duration of Depressive symptoms: Less than two weeks   Mania:  Increased Energy; Change in energy/activity; Racing thoughts; Irritability; Overconfidence; Recklessness   Anxiety:   Worrying; Tension  Psychosis:  -- (UTA)   Duration of Psychotic symptoms: No data recorded  Trauma:  -- (UTA)   Obsessions:  -- (UTA)   Compulsions:  -- (UTA)   Inattention:  -- (UTA)   Hyperactivity/Impulsivity:  -- (UTA)   Oppositional/Defiant Behaviors:  -- (UTA)   Emotional Irregularity:  Intense/inappropriate anger; Mood lability; Potentially harmful impulsivity; Recurrent suicidal behaviors/gestures/threats   Other Mood/Personality Symptoms:  None noted    Mental Status Exam Appearance and self-care  Stature:  Average   Weight:  Average weight   Clothing:  -- (Pt is covered up and it is not possible to see his clothing)   Grooming:  Normal   Cosmetic use:  None   Posture/gait:  Normal   Motor activity:  Not Remarkable   Sensorium  Attention:  -- (Pt was continuously falling asleep and had to be awoken to answer clinician's questions.)   Concentration:  -- (Pt was continuously falling asleep and had to be awoken to answer clinician's questions.)   Orientation:  -- (UTA)   Recall/memory:  -- (UTA)   Affect and Mood  Affect:  Negative; Blunted   Mood:   Irritable   Relating  Eye contact:  Fleeting   Facial expression:  Tense   Attitude toward examiner:  Dramatic; Irritable   Thought and Language  Speech flow: Clear and Coherent   Thought content:  Delusions   Preoccupation:  None   Hallucinations:  -- (UTA)   Organization:  No data recorded  Executive Functions  Fund of Knowledge:  Poor   Intelligence:  Average   Abstraction:  -- (UTA)   Judgement:  Poor   Reality Testing:  Distorted   Insight:  Affiliated Computer ServicesPoor   Decision Making:  Impulsive   Social Functioning  Social Maturity:  -- Industrial/product designer(UTA)   Social Judgement:  -- (UTA)   Stress  Stressors:  Grief/losses; Housing; Work   Coping Ability:  Deficient supports; Overwhelmed   Skill Deficits:  -- Industrial/product designer(UTA)   Supports:  Support needed     Religion: Religion/Spirituality Are You A Religious Person?:  (Not assessed) How Might This Affect Treatment?: Not assessed  Leisure/Recreation: Leisure / Recreation Do You Have Hobbies?:  (Not assessed)  Exercise/Diet: Exercise/Diet Do You Exercise?:  (Not assessed) Have You Gained or Lost A Significant Amount of Weight in the Past Six Months?:  (Not assessed) Do You Follow a Special Diet?:  (Not assessed) Do You Have Any Trouble Sleeping?:  (Not assessed)   CCA Employment/Education Employment/Work Situation: Employment / Work Psychologist, occupationalituation Employment situation:  (Not assessed, though pt noted he quit a job today) Patient's job has been impacted by current illness:  (Not assessed) What is the longest time patient has a held a job?: Not assessed Where was the patient employed at that time?: Not assessed Has patient ever been in the [employer] Lilly and Companymilitary?:  (Not assessed)  Education: Education Is Patient Currently Attending School?:  (Not assessed) Last Grade Completed:  (Not assessed) Name of High School: Not assessed Did Garment/textile technologistYou Graduate From [employer] School?:  (Not assessed) Did Theme park managerYou Attend College?:  (Not assessed) Did You Attend Graduate  School?:  (Not assessed) Did You Have Any Special Interests In School?: Not assessed Did You Have An Individualized Education Program (IIEP):  (Not assessed) Did You Have Any Difficulty At School?:  (Not assessed) Patient's Education Has Been Impacted by Current Illness:  (Not assessed)   CCA Family/Childhood History Family and Relationship History: Family history Marital status:  (Not assessed) Are you sexually  active?:  (Not assessed) What is your sexual orientation?: Not assessed Has your sexual activity been affected by drugs, alcohol, medication, or emotional stress?: Not assessed Does patient have children?:  (Not assessed)  Childhood History:  Childhood History By whom was/is the patient raised?:  (Not assessed) Additional childhood history information: Not assessed Description of patient's relationship with caregiver when they were a child: Not assessed Patient's description of current relationship with people who raised him/her: Pt's mother died in 04-04-18How were you disciplined when you got in trouble as a child/adolescent?: Not assessed Does patient have siblings?:  (Not assessed) Did patient suffer any verbal/emotional/physical/sexual abuse as a child?:  (Not assessed) Did patient suffer from severe childhood neglect?:  (Not assessed) Has patient ever been sexually abused/assaulted/raped as an adolescent or adult?:  (Not assessed) Was the patient ever a victim of a crime or a disaster?:  (Not assessed) Witnessed domestic violence?:  (Not assessed) Has patient been affected by domestic violence as an adult?:  (Not assessed)  Child/Adolescent Assessment:     CCA Substance Use Alcohol/Drug Use: Alcohol / Drug Use Pain Medications: Please see MAR Prescriptions: Please see MAR Over the Counter: Please see MAR History of alcohol / drug use?: Yes Longest period of sobriety (when/how long): Unknown, but pt shared tonight that he's not used drugs for several weeks  until tonight. Negative Consequences of Use: Legal,Work / School Withdrawal Symptoms:  (UTA)                         ASAM's:  Six Dimensions of Multidimensional Assessment  Dimension 1:  Acute Intoxication and/or Withdrawal Potential:      Dimension 2:  Biomedical Conditions and Complications:      Dimension 3:  Emotional, Behavioral, or Cognitive Conditions and Complications:     Dimension 4:  Readiness to Change:     Dimension 5:  Relapse, Continued use, or Continued Problem Potential:     Dimension 6:  Recovery/Living Environment:     ASAM Severity Score:    ASAM Recommended Level of Treatment: ASAM Recommended Level of Treatment:  (UTA - pt did not fully cooperate for the assessment)   Substance use Disorder (SUD) Substance Use Disorder (SUD)  Checklist Symptoms of Substance Use:  (UTA - pt did not fully cooperate for the assessment)  Recommendations for Services/Supports/Treatments: Cecilio Asper, NP, reviewed pt's chart and information and determined pt should be observed overnight for safety and stability and re-assessed in the morning by psychiatry. This information was relayed to pt's nurse, Julieta Gutting, at [phone].    DSM5 Diagnoses: Patient Active Problem List   Diagnosis Date Noted  . Substance induced mood disorder (HCC) 07/26/2020  . Suicidal ideation   . Polysubstance dependence (HCC) 07/25/2020  . Bipolar affective disorder, depressed, moderate (HCC) 03/06/2020  . Arthritis 09/30/2019  . Gastroesophageal reflux disease without esophagitis 09/21/2016  . Obstructive sleep apnea 09/21/2016  . Morbid obesity with BMI of 40.0-44.9, adult (HCC) 08/15/2016  . Stable angina pectoris (HCC) 08/15/2016    Patient Centered Plan: Patient is on the following Treatment Plan(s):  Anxiety, Impulse Control and Substance Abuse   Referrals to Alternative Service(s): Referred to Alternative Service(s):   Place:   Date:   Time:    Referred to Alternative Service(s):    Place:   Date:   Time:    Referred to Alternative Service(s):   Place:   Date:   Time:    Referred to Alternative  Service(s):   Place:   Date:   Time:     Dannielle Burn, LMFT

## 2021-01-23 NOTE — Discharge Instructions (Addendum)
Transfer to [facility] 

## 2021-01-23 NOTE — ED Notes (Signed)
[address]

## 2021-01-23 NOTE — ED Notes (Signed)
Pt woke up for TTs and went directly back to sleep

## 2021-01-23 NOTE — ED Notes (Signed)
Pt awake, alert & responsive, no distress noted, calm & cooperative.  Monitoring for safety.

## 2021-01-23 NOTE — ED Notes (Signed)
Pt ongoing TTS

## 2021-01-23 NOTE — ED Provider Notes (Signed)
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 metabolites    1000 Barbiturate and metabolites    200 Benzodiazepine                 200 Tricyclics and metabolites     300 Opiates and metabolites        300 Cocaine and metabolites        300 THC                            50 Performed at [facility], [address], [place], [place] [address]   . SARS Coronavirus 2 by RT PCR 10/21/2020 POSITIVE* NEGATIVE Final   Comment: EMAILED Nonnie Done RN 1910 10/21/20 A BROWNING (NOTE) SARS-CoV-2 target nucleic acids are  DETECTED.  The SARS-CoV-2 RNA is generally detectable in upper respiratory specimens during the acute phase of infection. Positive results are indicative of the presence of the identified virus, but do not rule out bacterial infection or co-infection with other pathogens not detected by the test. Clinical correlation with patient history and other diagnostic information is necessary to determine patient infection status. The expected result is Negative.  Fact Sheet for Patients: BloggerCourse.com  Fact Sheet for Healthcare Providers: SeriousBroker.it  This test is not yet approved or cleared by the [place] FDA and  has been authorized for detection and/or diagnosis  of SARS-CoV-2 by FDA under an Emergency Use Authorization (EUA).  This EUA will remain in effect (meaning this test can be used) for the duration of  the COVI                          D-19 declaration under Section 564(b)(1) of the Act, 21 U.S.C. section 360bbb-3(b)(1), unless the authorization is terminated or revoked sooner.    . Influenza A by PCR 10/21/2020 NEGATIVE  NEGATIVE Final  . Influenza B by PCR 10/21/2020 NEGATIVE  NEGATIVE Final   Comment: (NOTE) The Xpert Xpress SARS-CoV-2/FLU/RSV plus assay is intended as an aid in the diagnosis of influenza from Nasopharyngeal swab specimens and should not be used as a sole basis for treatment. Nasal washings and aspirates are unacceptable for Xpert Xpress SARS-CoV-2/FLU/RSV testing.  Fact Sheet for Patients: BloggerCourse.com  Fact Sheet for Healthcare Providers: SeriousBroker.it  This test is not yet approved or cleared by the [place] FDA and has been authorized for detection and/or diagnosis of SARS-CoV-2 by FDA under an Emergency Use Authorization (EUA). This EUA will remain in effect (meaning this test can be used) for the duration of the COVID-19  declaration under Section 564(b)(1) of the Act, 21 U.S.C. section 360bbb-3(b)(1), unless the authorization is terminated or revoked.  Performed at [facility], [address], [place], [place] [address]   Admission on 09/26/2020, Discharged on 09/27/2020  Component Date Value Ref Range Status  . Sodium 09/26/2020 140  135 - 145 mmol/L Final  . Potassium 09/26/2020 3.6  3.5 - 5.1 mmol/L Final  . Chloride 09/26/2020 106  98 - 111 mmol/L Final  . CO2 09/26/2020 25  22 - 32 mmol/L Final  . Glucose, Bld 09/26/2020 104* 70 - 99 mg/dL Final   Glucose reference range applies only to samples taken after fasting for at least 8 hours.  . BUN 09/26/2020 15  6 - 20 mg/dL Final  . Creatinine, Ser 09/26/2020 1.16  0.61 - 1.24 mg/dL Final  . Calcium 19/14/7829 9.6  8.9 - 10.3 mg/dL Final  . GFR, Estimated 09/26/2020 >60  >60 mL/min Final   Comment: (NOTE) Calculated using the CKD-EPI Creatinine Equation (2021)   . Anion gap 09/26/2020 9  5 - 15 Final   Performed at [facility], [address], [place], [place] [address]  . WBC 09/26/2020 4.6  4.0 - 10.5 K/uL Final  . RBC 09/26/2020 4.45  4.22 - 5.81 MIL/uL Final  . Hemoglobin 09/26/2020 13.3  13.0 - 17.0 g/dL Final  . HCT 08/65/7846 41.8  39.0 - 52.0 % Final  . MCV 09/26/2020 93.9  80.0 - 100.0 fL Final  . MCH 09/26/2020 29.9  26.0 - 34.0 pg Final  . MCHC 09/26/2020 31.8  30.0 - 36.0 g/dL Final  . RDW 96/29/5284 13.1  11.5 - 15.5 % Final  . Platelets 09/26/2020 194  150 - 400 K/uL Final  . nRBC 09/26/2020 0.0  0.0 - 0.2 % Final   Performed at [facility], [address] [place], [place] [address]  . Troponin I (High Sensitivity) 09/26/2020 9  <18 ng/L Final   Comment: (NOTE) Elevated high sensitivity troponin I (hsTnI) values and significant  changes across serial measurements may suggest ACS but many other  chronic and acute conditions are known to elevate hsTnI results.  Refer to the "Links" section for chest pain  algorithms and additional  guidance. Performed at [facility], [address] [name], [place] [address]   . Troponin I (High Sensitivity) 09/27/2020 7  <18 ng/L Final   Comment: (NOTE) Elevated high sensitivity troponin I (hsTnI) values and significant  changes across serial measurements may suggest ACS but many other  chronic and acute conditions are known to elevate hsTnI results.  Refer to the "Links" section for chest pain algorithms and additional  guidance. Performed at [facility], [address], [place], [place] [address]   . SARS Coronavirus 2 by RT PCR 09/27/2020 NEGATIVE  NEGATIVE Final   Comment: (NOTE) SARS-CoV-2 target nucleic acids are NOT DETECTED.  The SARS-CoV-2 RNA is generally detectable in upper respiratory specimens during the acute phase of infection. The lowest concentration of SARS-CoV-2 viral copies this assay can detect is 138 copies/mL. A negative result does not preclude SARS-Cov-2 infection and should not be used as the sole basis for treatment or other patient management decisions. A negative result may occur with  improper specimen collection/handling, submission of specimen other than nasopharyngeal swab, presence of viral mutation(s) within the areas targeted by this assay, and inadequate number of viral copies(<138 copies/mL). A negative result must be combined with clinical observations, patient history, and epidemiological information. The expected result is Negative.  Fact Sheet for Patients:  BloggerCourse.com  Fact Sheet for Healthcare Providers:  SeriousBroker.it  This test is no                          t yet approved or cleared by the [place] FDA and  has been authorized for detection and/or diagnosis of SARS-CoV-2 by FDA under an Emergency Use Authorization (EUA). This EUA will remain  in effect (meaning this test can be used) for the duration of the COVID-19  declaration under Section 564(b)(1) of the Act, 21 U.S.C.section 360bbb-3(b)(1), unless the authorization is terminated  or revoked sooner.      . Influenza A by PCR 09/27/2020 NEGATIVE  NEGATIVE Final  . Influenza B by PCR 09/27/2020 NEGATIVE  NEGATIVE Final   Comment: (NOTE) The Xpert Xpress SARS-CoV-2/FLU/RSV plus assay is intended as an aid in the diagnosis of influenza from Nasopharyngeal swab specimens and should not be used as a sole basis for treatment. Nasal washings and aspirates are unacceptable for Xpert Xpress SARS-CoV-2/FLU/RSV testing.  Fact Sheet for Patients: BloggerCourse.com  Fact Sheet for Healthcare Providers: SeriousBroker.it  This test is not yet approved or cleared by the [place] FDA and has been authorized for detection and/or diagnosis of SARS-CoV-2 by FDA under an Emergency Use Authorization (EUA). This EUA will remain in effect (meaning this test can be used) for the duration of the COVID-19 declaration under Section 564(b)(1) of the Act, 21 U.S.C. section 360bbb-3(b)(1), unless the authorization is terminated or revoked.  Performed at [facility], [address], [place], [place] [address]   . Alcohol, Ethyl (B) 09/27/2020 <10  <10 mg/dL Final   Comment: (NOTE) Lowest detectable limit for serum alcohol is 10 mg/dL.  For medical purposes only. Performed at [facility], [address], [place], [place] [address]   . Valproic Acid Lvl 09/27/2020 <10* 50.0 - 100.0 ug/mL Final   Comment: RESULTS CONFIRMED BY MANUAL DILUTION Performed at [facility], [address] [place], [place] [address]   Admission on 09/26/2020, Discharged on 09/26/2020  Component Date Value Ref Range Status  . Sodium 09/26/2020 138  135 - 145  mmol/L Final  . Potassium 09/26/2020 3.7  3.5 - 5.1 mmol/L Final  . Chloride 09/26/2020 105  98 - 111 mmol/L Final  . CO2 09/26/2020 22  22 - 32 mmol/L  Final  . Glucose, Bld 09/26/2020 92  70 - 99 mg/dL Final   Glucose reference range applies only to samples taken after fasting for at least 8 hours.  . BUN 09/26/2020 15  6 - 20 mg/dL Final  . Creatinine, Ser 09/26/2020 1.03  0.61 - 1.24 mg/dL Final  . Calcium 08/65/7846 9.2  8.9 - 10.3 mg/dL Final  . Total Protein 09/26/2020 7.6  6.5 - 8.1 g/dL Final  . Albumin 96/29/5284 4.3  3.5 - 5.0 g/dL Final  . AST 13/24/4010 30  15 - 41 U/L Final  . ALT 09/26/2020 28  0 - 44 U/L Final  . Alkaline Phosphatase 09/26/2020 70  38 - 126 U/L Final  . Total Bilirubin 09/26/2020 0.8  0.3 - 1.2 mg/dL Final  . GFR, Estimated 09/26/2020 >60  >60 mL/min Final   Comment: (NOTE) Calculated using the CKD-EPI Creatinine Equation (2021)   . Anion gap 09/26/2020 11  5 - 15 Final   Performed at [facility], [address] [place], [place] [address]  . WBC 09/26/2020 5.9  4.0 - 10.5 K/uL Final  . RBC 09/26/2020 4.22  4.22 - 5.81 MIL/uL Final  . Hemoglobin 09/26/2020 12.8* 13.0 - 17.0 g/dL Final  . HCT 66/44/0347 38.8* 39.0 - 52.0 % Final  . MCV 09/26/2020 91.9  80.0 - 100.0 fL Final  . MCH 09/26/2020 30.3  26.0 - 34.0 pg Final  . MCHC 09/26/2020 33.0  30.0 - 36.0 g/dL Final  . RDW 42/59/5638 13.1  11.5 - 15.5 % Final  . Platelets 09/26/2020 166  150 - 400 K/uL Final  . nRBC 09/26/2020 0.0  0.0 - 0.2 % Final   Performed at [facility], [address] [place], [place] [address]  . Alcohol, Ethyl (B) 09/26/2020 <10  <10 mg/dL Final   Comment: (NOTE) Lowest detectable limit for serum alcohol is 10 mg/dL.  For medical purposes only. Performed at [facility], [address], [place], [place] [address]   . Salicylate Lvl 09/26/2020 <7.0* 7.0 - 30.0 mg/dL Final   Performed at [facility], [address], [place], [place] [address]  . Acetaminophen (Tylenol), Serum 09/26/2020 <10* 10 - 30 ug/mL Final   Comment: (NOTE) Therapeutic  concentrations vary significantly. A range of 10-30 ug/mL  may be an effective concentration for many patients. However, some  are best treated at concentrations outside of this range. Acetaminophen concentrations >150 ug/mL at 4 hours after ingestion  and >50 ug/mL at 12 hours after ingestion are often associated with  toxic reactions.  Performed at [facility], [address], [place], [place] [address]   Admission on 08/23/2020, Discharged on 08/24/2020  Component Date Value Ref Range Status  . SARS Coronavirus 2 by RT PCR 08/23/2020 NEGATIVE  NEGATIVE Final   Comment: (NOTE) SARS-CoV-2 target nucleic acids are NOT DETECTED.  The SARS-CoV-2 RNA is generally detectable in upper respiratoy specimens during the acute phase of infection. The lowest concentration of SARS-CoV-2 viral copies this assay can detect is 131 copies/mL. A negative result does not preclude SARS-Cov-2 infection and should not be used as the sole basis for treatment or other patient management decisions. A negative result may occur with  improper specimen collection/handling, submission of  specimen other than nasopharyngeal swab, presence of viral mutation(s) within the areas targeted by this assay, and inadequate number of viral copies (<131 copies/mL). A negative result must be combined with clinical observations, patient history, and epidemiological information. The expected result is Negative.  Fact Sheet for Patients:  https://www.moore.com/  Fact Sheet for Healthcare Providers:  https://www.young.biz/  This test is no                          t yet approved or cleared by the [place] FDA and  has been authorized for detection and/or diagnosis of SARS-CoV-2 by FDA under an Emergency Use Authorization (EUA). This EUA will remain  in effect (meaning this test can be used) for the duration of the COVID-19 declaration under Section 564(b)(1)  of the Act, 21 U.S.C. section 360bbb-3(b)(1), unless the authorization is terminated or revoked sooner.    . Influenza A by PCR 08/23/2020 NEGATIVE  NEGATIVE Final  . Influenza B by PCR 08/23/2020 NEGATIVE  NEGATIVE Final   Comment: (NOTE) The Xpert Xpress SARS-CoV-2/FLU/RSV assay is intended as an aid in  the diagnosis of influenza from Nasopharyngeal swab specimens and  should not be used as a sole basis for treatment. Nasal washings and  aspirates are unacceptable for Xpert Xpress SARS-CoV-2/FLU/RSV  testing.  Fact Sheet for Patients: https://www.moore.com/  Fact Sheet for Healthcare Providers: https://www.young.biz/  This test is not yet approved or cleared by the [place] FDA and  has been authorized for detection and/or diagnosis of SARS-CoV-2 by  FDA under an Emergency Use Authorization (EUA). This EUA will remain  in effect (meaning this test can be used) for the duration of the  Covid-19 declaration under Section 564(b)(1) of the Act, 21  U.S.C. section 360bbb-3(b)(1), unless the authorization is  terminated or revoked. Performed at [facility], [address], [place], [place] [address]   Admission on 07/24/2020, Discharged on 07/26/2020  Component Date Value Ref Range Status  . Sodium 07/25/2020 138  135 - 145 mmol/L Final  . Potassium 07/25/2020 3.8  3.5 - 5.1 mmol/L Final  . Chloride 07/25/2020 100  98 - 111 mmol/L Final  . CO2 07/25/2020 25  22 - 32 mmol/L Final  . Glucose, Bld 07/25/2020 163* 70 - 99 mg/dL Final   Glucose reference range applies only to samples taken after fasting for at least 8 hours.  . BUN 07/25/2020 14  6 - 20 mg/dL Final  . Creatinine, Ser 07/25/2020 1.07  0.61 - 1.24 mg/dL Final  . Calcium 10/25/7251 9.7  8.9 - 10.3 mg/dL Final  . Total Protein 07/25/2020 8.1  6.5 - 8.1 g/dL Final  . Albumin 66/44/0347 4.7  3.5 - 5.0 g/dL Final  . AST 42/59/5638 30  15 - 41 U/L Final  . ALT  07/25/2020 27  0 - 44 U/L Final  . Alkaline Phosphatase 07/25/2020 69  38 - 126 U/L Final  . Total Bilirubin 07/25/2020 1.2  0.3 - 1.2 mg/dL Final  . GFR calc non Af Amer 07/25/2020 >60  >60 mL/min Final  . GFR calc Af Amer 07/25/2020 >60  >60 mL/min Final  . Anion gap 07/25/2020 13  5 - 15 Final   Performed at [facility], [address], [place], [place] [address]  . Alcohol, Ethyl (B) 07/25/2020 <10  <10 mg/dL Final   Comment: (NOTE) Lowest detectable limit for serum alcohol is 10 mg/dL.  For medical purposes only.  Performed at [facility], [address], [place], [place] [address]   . Salicylate Lvl 07/25/2020 <7.0* 7.0 - 30.0 mg/dL Final   Performed at [facility], [address], [place], [place] [address]  . Acetaminophen (Tylenol), Serum 07/25/2020 <10* 10 - 30 ug/mL Final   Comment: (NOTE) Therapeutic concentrations vary significantly. A range of 10-30 ug/mL  may be an effective concentration for many patients. However, some  are best treated at concentrations outside of this range. Acetaminophen concentrations >150 ug/mL at 4 hours after ingestion  and >50 ug/mL at 12 hours after ingestion are often associated with  toxic reactions.  Performed at [facility], [address], [place], [place] [address]   . WBC 07/25/2020 8.5  4.0 - 10.5 K/uL Final  . RBC 07/25/2020 4.45  4.22 - 5.81 MIL/uL Final  . Hemoglobin 07/25/2020 13.7  13.0 - 17.0 g/dL Final  . HCT 91/47/8295 40.7  39.0 - 52.0 % Final  . MCV 07/25/2020 91.5  80.0 - 100.0 fL Final  . MCH 07/25/2020 30.8  26.0 - 34.0 pg Final  . MCHC 07/25/2020 33.7  30.0 - 36.0 g/dL Final  . RDW 62/13/0865 13.9  11.5 - 15.5 % Final  . Platelets 07/25/2020 226  150 - 400 K/uL Final  . nRBC 07/25/2020 0.0  0.0 - 0.2 % Final   Performed at [facility], [address] [place], [place] [address]  . Opiates 07/25/2020 NONE DETECTED   NONE DETECTED Final  . Cocaine 07/25/2020 POSITIVE* NONE DETECTED Final  . Benzodiazepines 07/25/2020 NONE DETECTED  NONE DETECTED Final  . Amphetamines 07/25/2020 NONE DETECTED  NONE DETECTED Final  . Tetrahydrocannabinol 07/25/2020 POSITIVE* NONE DETECTED Final  . Barbiturates 07/25/2020 NONE DETECTED  NONE DETECTED Final   Comment: (NOTE) DRUG SCREEN FOR MEDICAL PURPOSES ONLY.  IF CONFIRMATION IS NEEDED FOR ANY PURPOSE, NOTIFY LAB WITHIN 5 DAYS.  LOWEST DETECTABLE LIMITS FOR URINE DRUG SCREEN Drug Class                     Cutoff (ng/mL) Amphetamine and metabolites    1000 Barbiturate and metabolites    200 Benzodiazepine                 200 Tricyclics and metabolites     300 Opiates and metabolites        300 Cocaine and metabolites        300 THC                            50 Performed at [facility], [address], [place], [place] [address]   . SARS Coronavirus 2 by RT PCR 07/25/2020 NEGATIVE  NEGATIVE Final   Comment: (NOTE) SARS-CoV-2 target nucleic acids are NOT DETECTED.  The SARS-CoV-2 RNA is generally detectable in upper respiratoy specimens during the acute phase of infection. The lowest concentration of SARS-CoV-2 viral copies this assay can detect is 131 copies/mL. A negative result does not preclude SARS-Cov-2 infection and should not be used as the sole basis for treatment or other patient management decisions. A negative result may occur with  improper specimen collection/handling, submission of specimen other than nasopharyngeal swab, presence of viral mutation(s) within the areas targeted by this assay, and inadequate number of viral copies (<131 copies/mL). A negative result must be combined with clinical observations, patient history, and epidemiological information. The expected result  is Negative.  Fact Sheet for Patients:  https://www.moore.com/  Fact Sheet for Healthcare Providers:   https://www.young.biz/  This test is no                          t yet approved or cleared by the [place] FDA and  has been authorized for detection and/or diagnosis of SARS-CoV-2 by FDA under an Emergency Use Authorization (EUA). This EUA will remain  in effect (meaning this test can be used) for the duration of the COVID-19 declaration under Section 564(b)(1) of the Act, 21 U.S.C. section 360bbb-3(b)(1), unless the authorization is terminated or revoked sooner.    . Influenza A by PCR 07/25/2020 NEGATIVE  NEGATIVE Final  . Influenza B by PCR 07/25/2020 NEGATIVE  NEGATIVE Final   Comment: (NOTE) The Xpert Xpress SARS-CoV-2/FLU/RSV assay is intended as an aid in  the diagnosis of influenza from Nasopharyngeal swab specimens and  should not be used as a sole basis for treatment. Nasal washings and  aspirates are unacceptable for Xpert Xpress SARS-CoV-2/FLU/RSV  testing.  Fact Sheet for Patients: https://www.moore.com/  Fact Sheet for Healthcare Providers: https://www.young.biz/  This test is not yet approved or cleared by the [place] FDA and  has been authorized for detection and/or diagnosis of SARS-CoV-2 by  FDA under an Emergency Use Authorization (EUA). This EUA will remain  in effect (meaning this test can be used) for the duration of the  Covid-19 declaration under Section 564(b)(1) of the Act, 21  U.S.C. section 360bbb-3(b)(1), unless the authorization is  terminated or revoked. Performed at [facility], [address], [place], [place] [address]     Allergies: Carrot [daucus carota] and Penicillins  PTA Medications: (Not in a hospital admission)   Medical Decision Making  Patient is a 45 year old male with history of bipolar disorder/bipolar affective disorder and substance abuse who presents to the behavioral health urgent care under IVC as a direct admit transfer from [facility] Emergency Department (see HPI, EDP note, and TTS note for further details if necessary) for SI with a plan and paranoia. Based on EDP note, TTS assessment, IVC paperwork, patient's history, patient's presentation, and my assessment, believe that the patient is a threat to himself at this time and recommend continuous observation/assessment at this time for the patient.    Recommendations  Based on my evaluation the patient does not appear to have an emergency medical condition.  Patient was medically cleared in the ED. Patient will be placed in [facility] continuous observation/assessment for further stabilization and treatment.  Patient will be reevaluated by the treatment team on January 23, 2021 and disposition to be determined at that time. 01/22/21 ED labs reviewed:  -CBC with differential: Unremarkable  -CMP: Unremarkable  -Ethanol: Within normal limits  -UDS: Positive for cocaine  -PCR COVID: Negative [facility] labs/tests ordered and reviewed:  -Hemoglobin A1c, lipid panel, and TSH ordered due to patient's reported history of taking antipsychotic medication.  Patient's hemoglobin A1c is slightly elevated at slightly elevated at 5.8% (similar to 5.8% 1 year ago).  Due to patient receiving Geodon 10 mg IM in the emergency department earlier this morning, no antipsychotic medications initiated at this time.    -Valproic acid level ordered due to patient's reported history of taking Depakote.  Results pending.  -EKG conducted on October 25, 2020, which showed QTC of 422 ms and no acute/concerning findings at that time.  Thus, no repeat EKG needed at  this time. Patient not taking any home medications.  Vistaril 25 mg 3 times daily as needed ordered for anxiety Trazodone 50 mg nightly as needed ordered for sleep  Jaclyn Shaggy, [name] 01/23/21  8:01 AM

## 2021-01-23 NOTE — ED Notes (Signed)
Dr. Clayborne Dana is aware of BP. Cleared for transport.

## 2021-01-23 NOTE — ED Notes (Signed)
Pt in [employer] custody to transport to [facility]. Pt belongings given to [facility] officers. Report given to [facility] earlier and EMTALA documentation completed.

## 2021-01-23 NOTE — ED Notes (Addendum)
Pt admitted to continuous assessment with IVC paperwork stating pt is experiencing "paranoia & thoughts of people after him & says he is just going to kill himself." Pt A&O x4, irritable but cooperative. Pt states he is "tired and sore because I jumped out of a building." Pt tolerated lab work and skin assessment well. Pt ambulated independently to unit. Oriented to unit/staff. No signs of acute distress noted. Will continue to monitor for safety.

## 2021-01-24 ENCOUNTER — Other Ambulatory Visit: Payer: Self-pay

## 2021-01-24 ENCOUNTER — Inpatient Hospital Stay (HOSPITAL_COMMUNITY)
Admission: AD | Admit: 2021-01-24 | Discharge: 2021-01-27 | DRG: 885 | Disposition: A | Discharge status: Home / Self Care | Payer: Medicare (Managed Care) | Source: Intra-hospital | Attending: Psychiatry | Admitting: Psychiatry

## 2021-01-24 ENCOUNTER — Encounter (HOSPITAL_COMMUNITY): Payer: Self-pay | Admitting: Psychiatry

## 2021-01-24 DIAGNOSIS — F315 Bipolar disorder, current episode depressed, severe, with psychotic features: Secondary | ICD-10-CM | POA: Diagnosis present

## 2021-01-24 DIAGNOSIS — F419 Anxiety disorder, unspecified: Secondary | ICD-10-CM | POA: Diagnosis present

## 2021-01-24 DIAGNOSIS — F17210 Nicotine dependence, cigarettes, uncomplicated: Secondary | ICD-10-CM | POA: Diagnosis present

## 2021-01-24 DIAGNOSIS — F319 Bipolar disorder, unspecified: Secondary | ICD-10-CM | POA: Diagnosis present

## 2021-01-24 DIAGNOSIS — K760 Fatty (change of) liver, not elsewhere classified: Secondary | ICD-10-CM | POA: Diagnosis present

## 2021-01-24 DIAGNOSIS — E119 Type 2 diabetes mellitus without complications: Secondary | ICD-10-CM | POA: Diagnosis present

## 2021-01-24 DIAGNOSIS — Z8249 Family history of ischemic heart disease and other diseases of the circulatory system: Secondary | ICD-10-CM

## 2021-01-24 DIAGNOSIS — F602 Antisocial personality disorder: Secondary | ICD-10-CM | POA: Diagnosis present

## 2021-01-24 DIAGNOSIS — F1010 Alcohol abuse, uncomplicated: Secondary | ICD-10-CM | POA: Diagnosis present

## 2021-01-24 DIAGNOSIS — R45851 Suicidal ideations: Secondary | ICD-10-CM | POA: Diagnosis present

## 2021-01-24 DIAGNOSIS — I10 Essential (primary) hypertension: Secondary | ICD-10-CM | POA: Diagnosis present

## 2021-01-24 DIAGNOSIS — G4700 Insomnia, unspecified: Secondary | ICD-10-CM | POA: Diagnosis present

## 2021-01-24 DIAGNOSIS — R45850 Homicidal ideations: Secondary | ICD-10-CM | POA: Diagnosis present

## 2021-01-24 DIAGNOSIS — F1420 Cocaine dependence, uncomplicated: Secondary | ICD-10-CM | POA: Diagnosis present

## 2021-01-24 DIAGNOSIS — F3162 Bipolar disorder, current episode mixed, moderate: Secondary | ICD-10-CM | POA: Diagnosis not present

## 2021-01-24 MED ORDER — ACETAMINOPHEN 325 MG PO TABS
650.0000 mg | ORAL_TABLET | Freq: Four times a day (QID) | ORAL | Status: DC | PRN
  Administered 2021-01-24 – 2021-01-27 (×5): 650 mg via ORAL
  Filled 2021-01-24 (×5): qty 2

## 2021-01-24 MED ORDER — MAGNESIUM HYDROXIDE 400 MG/5ML PO SUSP: 30.0000 mL | Freq: Every day | ORAL | Status: DC | PRN

## 2021-01-24 MED ORDER — NICOTINE POLACRILEX 2 MG MT GUM: 2.0000 mg | CHEWING_GUM | OROMUCOSAL | Status: DC | PRN

## 2021-01-24 MED ORDER — ALUM & MAG HYDROXIDE-SIMETH 200-200-20 MG/5ML PO SUSP: 30.0000 mL | ORAL | Status: DC | PRN

## 2021-01-24 MED ORDER — HYDROXYZINE HCL 25 MG PO TABS
25.0000 mg | ORAL_TABLET | Freq: Three times a day (TID) | ORAL | Status: DC | PRN
  Administered 2021-01-24 – 2021-01-26 (×3): 25 mg via ORAL
  Filled 2021-01-24 (×3): qty 1

## 2021-01-24 MED ORDER — TRAZODONE HCL 50 MG PO TABS
50.0000 mg | ORAL_TABLET | Freq: Every evening | ORAL | Status: DC | PRN
Start: 2021-01-24 — End: 2021-01-26
  Administered 2021-01-24 – 2021-01-25 (×2): 50 mg via ORAL
  Filled 2021-01-24 (×2): qty 1

## 2021-01-24 MED ORDER — ARIPIPRAZOLE 15 MG PO TABS
15.0000 mg | ORAL_TABLET | Freq: Every day | ORAL | Status: DC
  Filled 2021-01-24 (×2): qty 1

## 2021-01-24 MED ORDER — DIVALPROEX SODIUM ER 500 MG PO TB24
1000.0000 mg | ORAL_TABLET | Freq: Every day | ORAL | Status: DC
  Administered 2021-01-24 – 2021-01-26 (×3): 1000 mg via ORAL
  Filled 2021-01-24 (×6): qty 2

## 2021-01-24 MED ORDER — ARIPIPRAZOLE 5 MG PO TABS
5.0000 mg | ORAL_TABLET | Freq: Every day | ORAL | Status: DC
  Administered 2021-01-25: 5 mg via ORAL
  Filled 2021-01-24 (×2): qty 1
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
## 2021-01-24 NOTE — ED Notes (Signed)
Snack given.

## 2021-01-24 NOTE — Progress Notes (Signed)
Cory Floyd transferred to [facility] per MD order. Discussed with the patient and all questions fully answered. An After Visit Summary,EMTALA and Med Necessity forms were printed to be given to receiving nurse. Report given to [facility]. Patient escorted out and transferred via [employer].  Dickie La  01/24/2021 3:17 PM
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
## 2021-01-24 NOTE — Progress Notes (Signed)
The patient verbalized that today was his first day and had nothing else to share. His goal for tomorrow is to figure out more of what is going on with the unit.

## 2021-01-24 NOTE — Tx Team (Signed)
Initial Treatment Plan 01/24/2021 6:01 PM [name] ZMO:[id number]    PATIENT STRESSORS: Financial difficulties Medication change or noncompliance Substance abuse   PATIENT STRENGTHS: General fund of knowledge Physical Health   PATIENT IDENTIFIED PROBLEMS: Depression  Psychosis  Suicidal ideation    "Get back on my meds"  "Get back on track with my ACTT team"           DISCHARGE CRITERIA:  Improved stabilization in mood, thinking, and/or behavior Need for constant or close observation no longer present Reduction of life-threatening or endangering symptoms to within safe limits Verbal commitment to aftercare and medication compliance  PRELIMINARY DISCHARGE PLAN: Outpatient therapy Medication management  PATIENT/FAMILY INVOLVEMENT: This treatment plan has been presented to and reviewed with the patient, Cory Floyd.  The patient and family have been given the opportunity to ask questions and make suggestions.  Levin Bacon, RN 01/24/2021, 6:01 PM

## 2021-01-24 NOTE — Progress Notes (Signed)
Patient was accepted to [facility].    Meets inpatient criteria per Dr. Bronwen Betters.    Attending physician is Dr. Jola Babinski.    Notified Dickie La, RN.    Nurses call report to [facility] [phone].    Patient can arrive 01/24/2021 after Rosalio Loud, MSW, LCSW 01/24/2021 12:55 PM

## 2021-01-24 NOTE — ED Notes (Signed)
GIVEN LUNCH 

## 2021-01-24 NOTE — Progress Notes (Signed)
Pt is presently sleeping. No signs of acute distress noted. Administered PRN Tylenol for generalized body pain with no incident. Pt is irritable. Pt endorses SI, HI and AVH but refused to elaborate. Staff will monitor for pt's safety.

## 2021-01-24 NOTE — ED Notes (Signed)
Pt asleep in bed. Respirations even and unlabored. Will continue to monitor for safety. ?

## 2021-01-24 NOTE — Progress Notes (Signed)
Under review at [facility].  Penni Homans, MSW, LCSW 01/24/2021 9:54 AM

## 2021-01-25 MED ORDER — LORAZEPAM 1 MG PO TABS: 1.0000 mg | ORAL_TABLET | Freq: Four times a day (QID) | ORAL | Status: DC | PRN

## 2021-01-25 MED ORDER — ARIPIPRAZOLE 10 MG PO TABS
10.0000 mg | ORAL_TABLET | Freq: Every day | ORAL | Status: DC
  Filled 2021-01-25 (×2): qty 1

## 2021-01-25 MED ORDER — ZIPRASIDONE MESYLATE 20 MG IM SOLR
20.0000 mg | Freq: Four times a day (QID) | INTRAMUSCULAR | Status: DC | PRN
Start: 2021-01-25 — End: 2021-01-28

## 2021-01-25 MED ORDER — OLANZAPINE 10 MG PO TBDP: 10.0000 mg | ORAL_TABLET | Freq: Three times a day (TID) | ORAL | Status: DC | PRN

## 2021-01-25 MED ORDER — TRAZODONE HCL 50 MG PO TABS
50.0000 mg | ORAL_TABLET | Freq: Once | ORAL | Status: AC
  Administered 2021-01-25: 50 mg via ORAL
  Filled 2021-01-25 (×2): qty 1

## 2021-01-25 NOTE — Progress Notes (Signed)
Pt is guarded and irritable on approach and he does not talk to RN in depth.  Pt endorsed HI, but would not talk about specifics re: who the person is or what the method would be to harm the person.  Pt denies SI and AVH.  RN assessed for needs and concerns and provided support.  RN administered medications per provider orders.    Pt took medications without incident and no adverse reactions were noted.  Pt remains safe on the unit with q 15 min room checks in place.
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
## 2021-01-25 NOTE — Progress Notes (Signed)
D: Patient presents with sad affect and is guarded upon interaction but cooperative with assessment. Patient denies SI but reports HI but did not specify who the homicidal ideation is directed towards but indicated it was not a person present in the facility. Patient replied "not right now let's see how the night goes" when asked about AH/VH. Patient contracts for safety.  A: Provided positive reinforcement and encouragement.  R: Patient cooperative and receptive to efforts. Patient remains safe on the unit.   01/24/21 2106  Psych Admission Type (Psych Patients Only)  Admission Status Voluntary  Psychosocial Assessment  Patient Complaints Insomnia;Sadness  Eye Contact Brief  Facial Expression Anxious  Affect Appropriate to circumstance  Speech Logical/coherent  Interaction Guarded  Motor Activity Other (Comment) (WDL)  Appearance/Hygiene Disheveled  Behavior Characteristics Cooperative;Appropriate to situation  Mood Depressed;Sad  Thought Process  Coherency WDL  Content Blaming others  Delusions None reported or observed  Perception WDL  Hallucination None reported or observed  Judgment Impaired  Confusion None  Danger to Self  Current suicidal ideation? Denies  Self-Injurious Behavior No self-injurious ideation or behavior indicators observed or expressed   Agreement Not to Harm Self Yes  Description of Agreement verbal contract  Danger to Others  Danger to Others None reported or observed

## 2021-01-25 NOTE — [facility] Suicide Risk Assessment (Signed)
[facility] INPATIENT:  Family/Significant Other Suicide Prevention Education   Refusal of Consents:  Suicide Prevention Education:  Patient Refusal for Family/Significant Other Suicide Prevention Education: The patient Cory Floyd has refused to provide written consent for family/significant other to be provided Family/Significant Other Suicide Prevention Education during admission and/or prior to discharge.  Physician notified.   SPE completed with patient, as patient refused to consent to family contact. SPI pamphlet provided to pt and pt was encouraged to share information with support network, ask questions, and talk about any concerns relating to SPE. Patient denies access to guns/firearms and verbalized understanding of information provided. Mobile Crisis information also provided to patient.    Ruthann Cancer MSW, LCSW Clincal Social Worker  [facility]

## 2021-01-25 NOTE — Progress Notes (Addendum)
   01/25/21 2030  Psych Admission Type (Psych Patients Only)  Admission Status Voluntary  Psychosocial Assessment  Patient Complaints Anxiety;Depression  Eye Contact Brief  Facial Expression Anxious  Affect Anxious  Speech Logical/coherent  Interaction Guarded  Motor Activity Other (Comment) (wnl)  Appearance/Hygiene Disheveled  Behavior Characteristics Cooperative;Anxious  Mood Anxious;Depressed  Thought Process  Coherency WDL  Content Blaming others  Delusions None reported or observed  Perception Hallucinations  Hallucination Auditory;Command (telling him to hurt himself sometimes)  Judgment Impaired  Confusion None  Danger to Self  Current suicidal ideation? Denies  Danger to Others  Danger to Others Reported or observed  Danger to Others Abnormal  Description of Harmful Behavior pt expressed HI towards someone outside of [facility] but didn't want to talk about it   Pt seen in dayroom. Denies SI. Endorses HI but won't discuss it ("I don't want to talk about it."). Pt endorses command auditory hallucinations that were telling him to hurt himself. When asked how did he respond, pt said, "I just went to sleep." Pt has no desire to hurt himself. Pt endorses pain 8/10 in his right shoulder. "I jumped out a window to get to the police."  Pt a bit agitated d/t a disagreement with his provider today. "He came and kept telling me everything I was doing wrong. I could see the conversation was not going in a good direction. I asked him if he was finished talking and then I got up and left. I stayed in my room most of the day so I wouldn't run into him. They don't listen to [place] in here." Pt given hot packs for his shoulder. Pt says he has no living family and doesn't know what he will do for housing after discharge. "It seems that everything that can go wrong has gone wrong."

## 2021-01-25 NOTE — [facility] Counselor (Signed)
Adult Comprehensive Assessment  Patient ID: Cory Floyd, male   DOB: 12-28-1975, 45 y.o.   MRN: [id number]   Information Source: Information source: Patient  Current Stressors: Patient states their primary concerns and needs for treatment are:: "People are after me. 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 Family Relationships: "Don't speak to familyEngineer, petroleum / Lack of resources (include bankruptcy): SSDI Housing / Lack of housing: Pt reports he has been staying at [facility] for the past 3 weeks, however is unsure if he can return Physical health (include injuries & life threatening diseases): States he is in pain from jumping out of a window Substance abuse: Pt reports he has reduced his use  Living/Environment/Situation: Living Arrangements: Other (Comment) (Staying at [employer]) Living conditions (as described by patient or guardian): pt is unsure if he can return to live at [facility]  What is atmosphere in current home: Temporary  Family History: Marital status: Single Does patient have children?: Yes How many children?: 2 How is patient's relationship with their children?: Pt reported "they're all grown" and reported relationship is "good when they speak to me".    Childhood History: Additional childhood history information: Pt reports he was in and out the system Does patient have siblings?: 1 Number of siblings?:1 (sister) Description of patient's current relationship with siblings?: Pt reports "none". Did patient suffer any verbal/emotional/physical/sexual abuse as a child?: (Pt reported "I ain't discussing none of that")  Education: Highest grade of school patient has  completed: 12th Currently a student?: No Learning disability?: (Pt reported "I don't know")  Employment/Work Situation: Employment situation: On disability Why is patient on disability: mental health How long has patient been on disability: Unknown What is the longest time patient has a held a job?: Pt refused to answer Did You Receive Any Psychiatric Treatment/Services While in the [employer]?: No Are There Guns or Other Weapons in Your Home?: No Are These Weapons Safely Secured?: (N/A)  Financial Resources: Financial resources: Insurance claims handler Does patient have a Lawyer or guardian?: No  Alcohol/Substance Abuse: What has been your use of drugs/alcohol within the last 12 months?: Pt reports he has had minimal use the past 3 months and prior to this he used heavily. States he drinks 1-2 times a week. And states he has not been using drugs other than having a black out this past weekend.  If attempted suicide, did drugs/alcohol play a role in this?: No If yes, describe treatment: unknown Has alcohol/substance abuse ever caused legal problems?: (Pt didn't answer, but court calendar shows pt had court 12/7 and scheduled today 12/8 for identity theft and traffic ticket)  Social Support System: Describe Community Support System: None reported Type of faith/religion: None reported  Leisure/Recreation: Leisure and Hobbies: "I don't know anymore, states he used to play cards   Strengths/Needs: What is the patient's perception of their strengths?: Stated he does not know. CSW provided encouragement and stated that this patient appears to be a good advocate for himself.  Discharge Plan: Currently receiving community mental health services: Yes (From Whom)([facility]) Patient states concerns and preferences for aftercare planning are: Pt wants to continue with [facility] Patient states they will know when they are safe and ready for  discharge  when: Pt didn't respond Does patient have access to transportation?: No Does patient have financial barriers related to discharge medications?: No Plan for no access to transportation at discharge: CSW will continue to assess Will patient be returning to same living situation after discharge?: unsure at this time  Summary/Recommendations: Cory Floyd is a 45 year old male who presented to [facility] for SI. 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 Pt reports current stressors are due to people being after him, not having stable housing, lack of resources and support. Pt has been living at [facility] for the past 3 weeks, however is unsure if he is able to return. Pt is currently single and identifies as heterosexual.  Pt is currently employed with a private individual and has been working there for over a week. Pt reports minimal drug and alcohol use, toxicology came back positive for Cocaine. Pt describes their support system as having no one. Pt currently ACTT services through [facility].  While here, Cory Floyd can benefit from crisis stabilization, medication management, therapeutic milieu, and referrals for services.

## 2021-01-25 NOTE — H&P (Signed)
Psychiatric Admission Assessment Adult  Patient Identification: Pryor Curiaric E Horsey MRN:  [id number] Date of Evaluation:  01/25/2021 Chief Complaint:  Bipolar disorder (HCC) [F31.9] Principal Diagnosis: <principal problem not specified> Diagnosis:  Active Problems:   Bipolar disorder (HCC)  History of Present Illness: Patient is seen and examined.  Patient is a 45 year old male with a reported past psychiatric history significant for bipolar disorder as well as polysubstance use disorders who originally presented to the behavioral health urgent care center on 01/22/2021 after police had received a call from an owner of a boardinghouse for a man that was in his house causing damage to the home.  Once police arrived he jumped out of a window falling approximately 12 feet.  On evaluation in the emergency department he stated that he was going to kill the owner of the house, and if not he was going to just kill himself.  [facility]  He is very disorganized on his history.  He goes back to years and talks about the fact that he was in this transition house, and that the owner of the transition house was selling him drugs, and he would do worked to get those drugs.  He has previously been followed by an ACT[facility] service ([facility]).  He previously has been treated with Depakote, Abilify,  Trazodone and Remeron.  He was transferred to our facility on 01/24/2021.  He goes into a long dialogue today about the last 2 years.  Review of the electronic medical record revealed multiple emergency room visits as well as visits to the behavioral health urgent care center.  Prior to this most recent visit he was seen at the behavioral health urgent care center last on 10/21/2020.  His discharge medications at that point included Depakote DR 500 mg p.o. nightly and 250 mg p.o. daily as well as Risperdal 2 mg p.o. every 8 hours as needed, mirtazapine 15 mg p.o. nightly and  standing Risperdal 2 mg p.o. nightly.  He was admitted to the hospital for evaluation and stabilization.  Associated Signs/Symptoms: Depression Symptoms:  depressed mood, anhedonia, insomnia, psychomotor agitation, fatigue, feelings of worthlessness/guilt, difficulty concentrating, hopelessness, suicidal thoughts without plan, anxiety, loss of energy/fatigue, disturbed sleep, Duration of Depression Symptoms: Less than two weeks  (Hypo) Manic Symptoms:  Impulsivity, Irritable Mood, Labiality of Mood, Anxiety Symptoms:  Excessive Worry, Psychotic Symptoms:  denied PTSD Symptoms: Negative Total Time spent with patient: 30 minutes  Past Psychiatric History: Patient has multiple psychiatric hospitalizations in the past.  He has had multiple visits to the behavioral health urgent care center.  His last hospitalization in our system was in December 2020.  That was for similar circumstances and [facility].  Is the patient at risk to self? Yes.    Has the patient been a risk to self in the past 6 months? Yes.    Has the patient been a risk to self within the distant past? Yes.    Is the patient a risk to others? Yes.    Has the patient been a risk to others in the past 6 months? Yes.    Has the patient been a risk to others within the distant past? Yes.     Prior Inpatient Therapy:   Prior Outpatient Therapy:    Alcohol Screening: 1. How often do you have a drink containing alcohol?: 2 to 4 times a month 2. How many drinks containing alcohol do you have on  a typical day when you are drinking?: 1 or 2 3. How often do you have six or more drinks on one occasion?: Never AUDIT-C Score: 2 9. Have you or someone else been injured as a result of your drinking?: No 10. Has a relative or friend or a doctor or another health worker been concerned about your drinking or suggested you cut down?: No Alcohol Use Disorder Identification Test Final Score (AUDIT): 2 Substance  Abuse History in the last 12 months:  Yes.   Consequences of Substance Abuse: Medical Consequences:  Clearly contributing to this admission Previous Psychotropic Medications: Yes  Psychological Evaluations: Yes  Past Medical History:  Past Medical History:  Diagnosis Date  . 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 Anginal pain (HCC)   . Arthritis   . Bipolar 1 disorder (HCC)   . COVID-19   . Depressed   . Diabetes mellitus without complication (HCC)   . Dyspnea   . Fatty liver   . Hypertension   . Schizophrenia [facility])     Past Surgical History:  Procedure Laterality Date  . COLONOSCOPY  02/05/2014   diverticulosis, hyperplastic polyp x 2  . fracture arm Right   . KNEE SURGERY Right 2013  . NASAL SEPTUM SURGERY    . NASAL TURBINATE REDUCTION Bilateral 12/12/2016   Procedure: TURBINATE REDUCTION/SUBMUCOSAL RESECTION;  Surgeon: Linus Salmons, MD;  Location: [facility] ORS;  Service: ENT;  Laterality: Bilateral;   Family History:  Family History  Problem Relation Age of Onset  . Hepatitis C Mother   . Cancer Mother   . Cancer Maternal Aunt   . Hypertension Maternal Grandmother    Family Psychiatric  History: Noncontributory Tobacco Screening: Have you used any form of tobacco in the last 30 days? (Cigarettes, Smokeless Tobacco, Cigars, and/or Pipes): Yes Tobacco use, Select all that apply: 5 or more cigarettes per day Are you interested in Tobacco Cessation Medications?: Yes, will notify MD for an order Counseled patient on smoking cessation including recognizing danger situations, developing coping skills and basic information about quitting provided: Refused/Declined practical counseling Social History:  Social History   Substance and Sexual Activity  Alcohol Use Yes   Comment: 1/2-5th pint of liquor     Social History   Substance and Sexual Activity  Drug Use Yes  . Types: Cocaine, Marijuana   Comment: heroin    Additional Social History:                            Allergies:   Allergies  Allergen Reactions  . Carrot [Daucus Carota] Anaphylaxis and Rash  . Penicillins Anaphylaxis and Hives    Has patient had a PCN reaction causing immediate rash, facial/tongue/throat swelling, SOB or lightheadedness with hypotension: Yes Has patient had a PCN reaction causing severe rash involving mucus membranes or skin necrosis: No Has patient had a PCN reaction that required hospitalization Yes Has patient had a PCN reaction occurring within the last 10 years: No If all of the above answers are "NO", then may proceed with Cephalosporin use.    Lab Results: No results found for this or any previous visit (from the past 48 hour(s)).  Blood Alcohol level:  Lab Results  Component Value Date   [employer] <10 01/22/2021   ETH <10 10/21/2020    Metabolic Disorder Labs:  Lab Results  Component Value Date   HGBA1C 5.8 (H) 01/23/2021   MPG 119.76 01/23/2021  MPG 119.76 10/26/2019   No results found for: PROLACTIN Lab Results  Component Value Date   CHOL 187 01/23/2021   TRIG 61 01/23/2021   HDL 70 01/23/2021   CHOLHDL 2.7 01/23/2021   VLDL 12 01/23/2021   LDLCALC 105 (H) 01/23/2021    Current Medications: Current Facility-Administered Medications  Medication Dose Route Frequency Provider Last Rate Last Admin  . acetaminophen (TYLENOL) tablet 650 mg  650 mg Oral Q6H PRN Estella Husk, MD   650 mg at 01/25/21 0904  . alum & mag hydroxide-simeth (MAALOX/MYLANTA) 200-200-20 MG/5ML suspension 30 mL  30 mL Oral Q4H PRN Estella Husk, MD      . Melene Muller ON 01/26/2021] ARIPiprazole (ABILIFY) tablet 10 mg  10 mg Oral Daily Antonieta Pert, MD      . divalproex (DEPAKOTE ER) 24 hr tablet 1,000 mg  1,000 mg Oral QHS Estella Husk, MD   1,000 mg at 01/24/21 2107  . hydrOXYzine (ATARAX/VISTARIL) tablet 25 mg  25 mg Oral TID PRN Estella Husk, MD   25 mg at 01/24/21 2107  . OLANZapine zydis (ZYPREXA) disintegrating tablet 10 mg  10 mg  Oral Q8H PRN Antonieta Pert, MD       And  . LORazepam (ATIVAN) tablet 1 mg  1 mg Oral Q6H PRN Antonieta Pert, MD       And  . ziprasidone (GEODON) injection 20 mg  20 mg Intramuscular Q6H PRN Antonieta Pert, MD      . magnesium hydroxide (MILK OF MAGNESIA) suspension 30 mL  30 mL Oral Daily PRN Estella Husk, MD      . nicotine polacrilex (NICORETTE) gum 2 mg  2 mg Oral PRN Antonieta Pert, MD      . traZODone (DESYREL) tablet 50 mg  50 mg Oral QHS PRN Estella Husk, MD   50 mg at 01/24/21 2106   PTA Medications: No medications prior to admission.    Musculoskeletal: Strength & Muscle Tone: within normal limits Gait & Station: normal Patient leans: N/A            Psychiatric Specialty Exam:  Presentation  General Appearance: Disheveled  Eye Contact:Fair  Speech:Normal Rate  Speech Volume:Normal  Handedness:Right   Mood and Affect  Mood:Anxious; Dysphoric  Affect:Congruent   Thought Process  Thought Processes:Goal Directed  Duration of Psychotic Symptoms: Greater than six months  Past Diagnosis of Schizophrenia or Psychoactive disorder: No  Descriptions of Associations:Circumstantial  Orientation:Full (Time, Place and Person)  Thought Content:Paranoid Ideation  Hallucinations:Hallucinations: None  Ideas of Reference:Paranoia; Delusions  Suicidal Thoughts:Suicidal Thoughts: Yes, Passive SI Passive Intent and/or Plan: Without Intent  Homicidal Thoughts:Homicidal Thoughts: Yes, Passive HI Passive Intent and/or Plan: Without Intent   Sensorium  Memory:Immediate Poor; Recent Poor; Remote Poor  Judgment:Impaired  Insight:Lacking   Executive Functions  Concentration:Poor  Attention Span:Poor  Recall:Poor  Fund of Knowledge:Poor  Language:Poor   Psychomotor Activity  Psychomotor Activity:Psychomotor Activity: Increased   Assets  Assets:Desire for Improvement; Resilience   Sleep  Sleep:Sleep:  Fair    Physical Exam: Physical Exam Vitals and nursing note reviewed.  HENT:     Head: Normocephalic and atraumatic.  Pulmonary:     Effort: Pulmonary effort is normal.  Neurological:     General: No focal deficit present.     Mental Status: He is alert and oriented to person, place, and time.    ROS Blood pressure 116/81, pulse 82, temperature 98.4 F (36.9 C),  temperature source Oral, resp. rate 16, height 5' 8.5" (1.74 m), weight 82.6 kg, SpO2 100 %. Body mass index is 27.27 kg/m.  Treatment Plan Summary: Daily contact with patient to assess and evaluate symptoms and progress in treatment, Medication management and Plan : Patient is seen and examined.  Patient is a 45 year old male with the above-stated past psychiatric history who was admitted with suicidal ideation, homicidal ideation and substance abuse.  He will be admitted to the hospital.  He will be integrated in the milieu.  He will be encouraged to attend groups.  He has already been restarted on Abilify and Depakote.  He is currently on 5 mg p.o. daily of Abilify, and we will increase that to 10 mg p.o. daily.  He is already been started on the Depakote extended release at 1000 mg p.o. nightly.  His Depakote level on admission was less than 10.  I will also add an agitation protocol.  He currently is also on trazodone 50 mg p.o. nightly as needed, and we will increase that if necessary.  He is unwilling to give [place] any information about the name of the person that he is going to kill.  We will have to monitor this.  Review of his admission laboratories showed normal electrolytes with a glucose of 101, and a creatinine of 1.04.  Liver function enzymes were normal.  Lipid panel was normal.  CBC was essentially normal.  Valproic acid level was less than 10, glucose was 101 as mentioned above, hemoglobin A1c was 5.8.  TSH was normal at 1.053.  Respiratory panel for influenza A, B and coronavirus were negative.  Drug screen was positive  for cocaine.  EKG was not obtained but we will go on to get 1 of those today.  His vital signs are stable, he is afebrile.  Pulse oximetry was 100% on room air.  Observation Level/Precautions:  Detox 15 minute checks  Laboratory:  Chemistry Profile  Psychotherapy:    Medications:    Consultations:    Discharge Concerns:    Estimated LOS:  Other:     Physician Treatment Plan for Primary Diagnosis: <principal problem not specified> Long Term Goal(s): Improvement in symptoms so as ready for discharge  Short Term Goals: Ability to identify changes in lifestyle to reduce recurrence of condition will improve, Ability to verbalize feelings will improve, Ability to disclose and discuss suicidal ideas, Ability to demonstrate self-control will improve, Ability to identify and develop effective coping behaviors will improve, Ability to maintain clinical measurements within normal limits will improve, Compliance with prescribed medications will improve and Ability to identify triggers associated with substance abuse/mental health issues will improve  Physician Treatment Plan for Secondary Diagnosis: Active Problems:   Bipolar disorder (HCC)  Long Term Goal(s): Improvement in symptoms so as ready for discharge  Short Term Goals: Ability to identify changes in lifestyle to reduce recurrence of condition will improve, Ability to verbalize feelings will improve, Ability to disclose and discuss suicidal ideas, Ability to demonstrate self-control will improve, Ability to identify and develop effective coping behaviors will improve, Ability to maintain clinical measurements within normal limits will improve, Compliance with prescribed medications will improve and Ability to identify triggers associated with substance abuse/mental health issues will improve  I certify that inpatient services furnished can reasonably be expected to improve the patient's condition.    Antonieta Pert, MD 4/5/20221:57 PM

## 2021-01-25 NOTE — [facility] Counselor (Signed)
CSW spoke with Jens Som with [facility] [phone]) who wanted to confirm patient was in the hospital and confirm discharge plans.   Mr. Manson Passey is unaware if this patient is able to return to [facility] or not.     Ruthann Cancer MSW, LCSW Clincal Social Worker  [facility]

## 2021-01-25 NOTE — Progress Notes (Signed)
EKG placed on pt's chart.

## 2021-01-26 MED ORDER — TRAZODONE HCL 100 MG PO TABS
100.0000 mg | ORAL_TABLET | Freq: Every evening | ORAL | Status: DC | PRN
  Administered 2021-01-26: 100 mg via ORAL
  Filled 2021-01-26: qty 1

## 2021-01-26 MED ORDER — ARIPIPRAZOLE 15 MG PO TABS
15.0000 mg | ORAL_TABLET | Freq: Every day | ORAL | Status: DC
  Administered 2021-01-26 – 2021-01-27 (×2): 15 mg via ORAL
  Filled 2021-01-26 (×4): qty 1

## 2021-01-26 NOTE — Progress Notes (Addendum)
RN reviewed Robert Moore's (student RN) documentation of pt's flowsheets and assessment and this RN is in agreement with documentation and pt's plan of care.  

## 2021-01-26 NOTE — [facility] Group Notes (Signed)
Type of Therapy/Topic: Group Therapy: Six Dimensions of Wellness  Participation Level: Active  Description of Group:  This group will address the concept of wellness and the six concepts of wellness: occupational, physical, social, intellectual, spiritual, and emotional. Patients will be encouraged to process areas in their lives that are out of balance and identify reasons for remaining unbalanced. Patients will be encouraged to explore ways to practice healthy habits daily to attain better physical and mental health outcomes.  Therapeutic Goals:  1. Identify aspects of wellness that they are doing well.  2. Identify aspects of wellness that they would like to improve upon.  3. Identify one action they can take to improve an aspect of wellness in their lives.  Summary of Patient Progress: Due to unforseen circumstances CSW provided the patient with a  packet of worksheets during group time.  The Pt accepted these worksheets and was offered an opportunity to discuss the worksheets with the CSW.  Tylor Courtwright, LCSWA Clinicial Social Worker [facility] 

## 2021-01-26 NOTE — Progress Notes (Signed)
   01/26/21 2030  Psych Admission Type (Psych Patients Only)  Admission Status Voluntary  Psychosocial Assessment  Eye Contact Brief  Facial Expression Anxious  Affect Appropriate to circumstance  Speech Logical/coherent  Interaction Guarded;Assertive  Motor Activity Other (Comment) (wnl)  Appearance/Hygiene Unremarkable  Behavior Characteristics Cooperative;Anxious  Mood Anxious;Depressed  Thought Process  Coherency WDL  Content Blaming others  Delusions None reported or observed  Perception WDL  Hallucination None reported or observed  Judgment Impaired  Confusion None  Danger to Self  Current suicidal ideation? Denies  Danger to Others  Danger to Others Reported or observed  Danger to Others Abnormal  Harmful Behavior to others No threats or harm toward other people  Description of Harmful Behavior pt expressed HI towards someone outside of [facility] but didn't want to talk about it

## 2021-01-26 NOTE — Tx Team (Signed)
Interdisciplinary Treatment and Diagnostic Plan Update  01/26/2021 Time of Session: 9:10am  Cory Floyd MRN: [id number]  Principal Diagnosis: <principal problem not specified>  Secondary Diagnoses: Active Problems:   Bipolar disorder (HCC)   Current Medications:  Current Facility-Administered Medications  Medication Dose Route Frequency Provider Last Rate Last Admin  . acetaminophen (TYLENOL) tablet 650 mg  650 mg Oral Q6H PRN Estella Husk, MD   650 mg at 01/25/21 1727  . alum & mag hydroxide-simeth (MAALOX/MYLANTA) 200-200-20 MG/5ML suspension 30 mL  30 mL Oral Q4H PRN Estella Husk, MD      . ARIPiprazole (ABILIFY) tablet 15 mg  15 mg Oral Daily Antonieta Pert, MD   15 mg at 01/26/21 0900  . divalproex (DEPAKOTE ER) 24 hr tablet 1,000 mg  1,000 mg Oral QHS Estella Husk, MD   1,000 mg at 01/25/21 2155  . hydrOXYzine (ATARAX/VISTARIL) tablet 25 mg  25 mg Oral TID PRN Estella Husk, MD   25 mg at 01/26/21 0901  . OLANZapine zydis (ZYPREXA) disintegrating tablet 10 mg  10 mg Oral Q8H PRN Antonieta Pert, MD       And  . LORazepam (ATIVAN) tablet 1 mg  1 mg Oral Q6H PRN Antonieta Pert, MD       And  . ziprasidone (GEODON) injection 20 mg  20 mg Intramuscular Q6H PRN Antonieta Pert, MD      . magnesium hydroxide (MILK OF MAGNESIA) suspension 30 mL  30 mL Oral Daily PRN Estella Husk, MD      . nicotine polacrilex (NICORETTE) gum 2 mg  2 mg Oral PRN Antonieta Pert, MD      . traZODone (DESYREL) tablet 50 mg  50 mg Oral QHS PRN Estella Husk, MD   50 mg at 01/25/21 2155   PTA Medications: No medications prior to admission.    Patient Stressors: Financial difficulties Medication change or noncompliance Substance abuse  Patient Strengths: General fund of knowledge Physical Health  Treatment Modalities: Medication Management, Group therapy, Case management,  1 to 1 session with clinician, Psychoeducation, Recreational  therapy.   Physician Treatment Plan for Primary Diagnosis: <principal problem not specified> Long Term Goal(s): Improvement in symptoms so as ready for discharge Improvement in symptoms so as ready for discharge   Short Term Goals: Ability to identify changes in lifestyle to reduce recurrence of condition will improve Ability to verbalize feelings will improve Ability to disclose and discuss suicidal ideas Ability to demonstrate self-control will improve Ability to identify and develop effective coping behaviors will improve Ability to maintain clinical measurements within normal limits will improve Compliance with prescribed medications will improve Ability to identify triggers associated with substance abuse/mental health issues will improve Ability to identify changes in lifestyle to reduce recurrence of condition will improve Ability to verbalize feelings will improve Ability to disclose and discuss suicidal ideas Ability to demonstrate self-control will improve Ability to identify and develop effective coping behaviors will improve Ability to maintain clinical measurements within normal limits will improve Compliance with prescribed medications will improve Ability to identify triggers associated with substance abuse/mental health issues will improve  Medication Management: Evaluate patient's response, side effects, and tolerance of medication regimen.  Therapeutic Interventions: 1 to 1 sessions, Unit Group sessions and Medication administration.  Evaluation of Outcomes: Progressing  Physician Treatment Plan for Secondary Diagnosis: Active Problems:   Bipolar disorder (HCC)  Long Term Goal(s): Improvement in symptoms so as ready for discharge  Improvement in symptoms so as ready for discharge   Short Term Goals: Ability to identify changes in lifestyle to reduce recurrence of condition will improve Ability to verbalize feelings will improve Ability to disclose and discuss  suicidal ideas Ability to demonstrate self-control will improve Ability to identify and develop effective coping behaviors will improve Ability to maintain clinical measurements within normal limits will improve Compliance with prescribed medications will improve Ability to identify triggers associated with substance abuse/mental health issues will improve Ability to identify changes in lifestyle to reduce recurrence of condition will improve Ability to verbalize feelings will improve Ability to disclose and discuss suicidal ideas Ability to demonstrate self-control will improve Ability to identify and develop effective coping behaviors will improve Ability to maintain clinical measurements within normal limits will improve Compliance with prescribed medications will improve Ability to identify triggers associated with substance abuse/mental health issues will improve     Medication Management: Evaluate patient's response, side effects, and tolerance of medication regimen.  Therapeutic Interventions: 1 to 1 sessions, Unit Group sessions and Medication administration.  Evaluation of Outcomes: Progressing   RN Treatment Plan for Primary Diagnosis: <principal problem not specified> Long Term Goal(s): Knowledge of disease and therapeutic regimen to maintain health will improve  Short Term Goals: Ability to remain free from injury will improve, Ability to demonstrate self-control, Ability to participate in decision making will improve, Ability to verbalize feelings will improve, Ability to disclose and discuss suicidal ideas and Ability to identify and develop effective coping behaviors will improve  Medication Management: RN will administer medications as ordered by provider, will assess and evaluate patient's response and provide education to patient for prescribed medication. RN will report any adverse and/or side effects to prescribing provider.  Therapeutic Interventions: 1 on 1  counseling sessions, Psychoeducation, Medication administration, Evaluate responses to treatment, Monitor vital signs and CBGs as ordered, Perform/monitor CIWA, COWS, AIMS and Fall Risk screenings as ordered, Perform wound care treatments as ordered.  Evaluation of Outcomes: Progressing   LCSW Treatment Plan for Primary Diagnosis: <principal problem not specified> Long Term Goal(s): Safe transition to appropriate next level of care at discharge, Engage patient in therapeutic group addressing interpersonal concerns.  Short Term Goals: Engage patient in aftercare planning with referrals and resources, Increase social support, Increase emotional regulation, Facilitate acceptance of mental health diagnosis and concerns, Identify triggers associated with mental health/substance abuse issues and Increase skills for wellness and recovery  Therapeutic Interventions: Assess for all discharge needs, 1 to 1 time with Social worker, Explore available resources and support systems, Assess for adequacy in community support network, Educate family and significant other(s) on suicide prevention, Complete Psychosocial Assessment, Interpersonal group therapy.  Evaluation of Outcomes: Progressing   Progress in Treatment: Attending groups: No. Participating in groups: No. Taking medication as prescribed: Yes. Toleration medication: Yes. Family/Significant other contact made: No, will contact:  Declined Consents  Patient understands diagnosis: No. Discussing patient identified problems/goals with staff: Yes. Medical problems stabilized or resolved: Yes. Denies suicidal/homicidal ideation: Yes. Issues/concerns per patient self-inventory: No.   New problem(s) identified: No, Describe:  None   New Short Term/Long Term Goal(s): medication stabilization, elimination of SI thoughts, development of comprehensive mental wellness plan.   Patient Goals:  "To clear my mind and get rid of negative  thoughts"  Discharge Plan or Barriers: Patient recently admitted. CSW will continue to follow and assess for appropriate referrals and possible discharge planning.   Reason for Continuation of Hospitalization: Aggression Depression Medication stabilization Suicidal ideation  Estimated Length of Stay: 3 to 5 days   Attendees: Patient: Cory Floyd 01/26/2021   Physician: Landry Mellow, MD 01/26/2021   Nursing:  01/26/2021   RN Care Manager: 01/26/2021   Social Worker: Melba Coon, LCSWA 01/26/2021   Recreational Therapist:  01/26/2021   Other:  01/26/2021   Other:  01/26/2021   Other: 01/26/2021     Scribe for Treatment Team: Aram Beecham, LCSWA 01/26/2021 11:16 AM

## 2021-01-26 NOTE — Progress Notes (Signed)
[facility] MD Progress Note  01/26/2021 11:46 AM Cory Floyd  MRN:  [id number] Subjective: Patient is a 45 year old male with a reported past psychiatric history significant for bipolar disorder as well as polysubstance use disorders who was admitted on 01/25/2021 secondary to suicidal ideation and homicidal ideation.  Objective: Patient is seen and examined.  Patient is a 45 year old male with the above-stated past psychiatric history who is seen in follow-up.  He is mildly less irritable today.  He is not attending groups.  When I went in to talk with him this morning he had minimal eye contact.  He continues to voice vague suicidal and homicidal ideation without great evidence of either.  Social work mention this morning that he was interested in going to a residential treatment program, and I asked him about that.  He stated he would only be willing to go to a long-term program.  He stated a week program would not be enough.  He denied any side effects to his current medications.  His sleep was a little bit better last night, but still only got 4.75 hours.  His vital signs are stable, he is afebrile.  Pulse oximetry on room air was 99%.    Principal Problem: <principal problem not specified> Diagnosis: Active Problems:   Bipolar disorder (HCC)  Total Time spent with patient: 15 minutes  Past Psychiatric History: See admission H&P  Past Medical History:  Past Medical History:  Diagnosis Date  . 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 Anginal pain (HCC)   . Arthritis   . Bipolar 1 disorder (HCC)   . COVID-19   . Depressed   . Diabetes mellitus without complication (HCC)   . Dyspnea   . Fatty liver   . Hypertension   . Schizophrenia [facility])     Past Surgical History:  Procedure Laterality Date  . COLONOSCOPY  02/05/2014   diverticulosis, hyperplastic polyp x 2  . fracture arm Right   . KNEE SURGERY Right 2013  . NASAL SEPTUM SURGERY    . NASAL TURBINATE REDUCTION Bilateral 12/12/2016    Procedure: TURBINATE REDUCTION/SUBMUCOSAL RESECTION;  Surgeon: Linus Salmons, MD;  Location: [facility] ORS;  Service: ENT;  Laterality: Bilateral;   Family History:  Family History  Problem Relation Age of Onset  . Hepatitis C Mother   . Cancer Mother   . Cancer Maternal Aunt   . Hypertension Maternal Grandmother    Family Psychiatric  History: See admission H&P Social History:  Social History   Substance and Sexual Activity  Alcohol Use Yes   Comment: 1/2-5th pint of liquor     Social History   Substance and Sexual Activity  Drug Use Yes  . Types: Cocaine, Marijuana   Comment: heroin    Social History   Socioeconomic History  . Marital status: Divorced    Spouse name: Not on file  . Number of children: Not on file  . Years of education: Not on file  . Highest education level: Not on file  Occupational History  . Not on file  Tobacco Use  . Smoking status: Current Every Day Smoker    Packs/day: 1.00    Years: 20.00    Pack years: 20.00    Types: Cigarettes  . Smokeless tobacco: Never Used  Vaping Use  . Vaping Use: Never used  Substance and Sexual Activity  . Alcohol use: Yes    Comment: 1/2-5th pint of liquor  . Drug use: Yes  Types: Cocaine, Marijuana    Comment: heroin  . Sexual activity: Yes    Birth control/protection: None  Other Topics Concern  . Not on file  Social History Narrative  . Not on file   Social Determinants of Health   Financial Resource Strain: Not on file  Food Insecurity: Not on file  Transportation Needs: Not on file  Physical Activity: Not on file  Stress: Not on file  Social Connections: Not on file   Additional Social History:                         Sleep: Fair  Appetite:  Good  Current Medications: Current Facility-Administered Medications  Medication Dose Route Frequency Provider Last Rate Last Admin  . acetaminophen (TYLENOL) tablet 650 mg  650 mg Oral Q6H PRN Estella Husk, MD   650 mg at  01/25/21 1727  . alum & mag hydroxide-simeth (MAALOX/MYLANTA) 200-200-20 MG/5ML suspension 30 mL  30 mL Oral Q4H PRN Estella Husk, MD      . ARIPiprazole (ABILIFY) tablet 15 mg  15 mg Oral Daily Antonieta Pert, MD   15 mg at 01/26/21 0900  . divalproex (DEPAKOTE ER) 24 hr tablet 1,000 mg  1,000 mg Oral QHS Estella Husk, MD   1,000 mg at 01/25/21 2155  . hydrOXYzine (ATARAX/VISTARIL) tablet 25 mg  25 mg Oral TID PRN Estella Husk, MD   25 mg at 01/26/21 0901  . OLANZapine zydis (ZYPREXA) disintegrating tablet 10 mg  10 mg Oral Q8H PRN Antonieta Pert, MD       And  . LORazepam (ATIVAN) tablet 1 mg  1 mg Oral Q6H PRN Antonieta Pert, MD       And  . ziprasidone (GEODON) injection 20 mg  20 mg Intramuscular Q6H PRN Antonieta Pert, MD      . magnesium hydroxide (MILK OF MAGNESIA) suspension 30 mL  30 mL Oral Daily PRN Estella Husk, MD      . nicotine polacrilex (NICORETTE) gum 2 mg  2 mg Oral PRN Antonieta Pert, MD      . traZODone (DESYREL) tablet 50 mg  50 mg Oral QHS PRN Estella Husk, MD   50 mg at 01/25/21 2155    Lab Results: No results found for this or any previous visit (from the past 48 hour(s)).  Blood Alcohol level:  Lab Results  Component Value Date   ETH <10 01/22/2021   ETH <10 10/21/2020    Metabolic Disorder Labs: Lab Results  Component Value Date   HGBA1C 5.8 (H) 01/23/2021   MPG 119.76 01/23/2021   MPG 119.76 10/26/2019   No results found for: PROLACTIN Lab Results  Component Value Date   CHOL 187 01/23/2021   TRIG 61 01/23/2021   HDL 70 01/23/2021   CHOLHDL 2.7 01/23/2021   VLDL 12 01/23/2021   LDLCALC 105 (H) 01/23/2021    Physical Findings: AIMS: Facial and Oral Movements Muscles of Facial Expression: None, normal Lips and Perioral Area: None, normal Jaw: None, normal Tongue: None, normal,Extremity Movements Upper (arms, wrists, hands, fingers): None, normal Lower (legs, knees, ankles, toes):  None, normal, Trunk Movements Neck, shoulders, hips: None, normal, Overall Severity Severity of abnormal movements (highest score from questions above): None, normal Incapacitation due to abnormal movements: None, normal Patient's awareness of abnormal movements (rate only patient's report): No Awareness, Dental Status Current problems with teeth and/or dentures?: Yes Does patient  usually wear dentures?: No  CIWA:    COWS:     Musculoskeletal: Strength & Muscle Tone: within normal limits Gait & Station: normal Patient leans: N/A  Psychiatric Specialty Exam:  Presentation  General Appearance: Disheveled  Eye Contact:Minimal  Speech:Normal Rate  Speech Volume:Normal  Handedness:Right   Mood and Affect  Mood:Irritable  Affect:Congruent   Thought Process  Thought Processes:Goal Directed  Descriptions of Associations:Circumstantial  Orientation:Full (Time, Place and Person)  Thought Content:Illogical  History of Schizophrenia/Schizoaffective disorder:No  Duration of Psychotic Symptoms:Less than six months  Hallucinations:Hallucinations: Auditory  Ideas of Reference:Delusions; Paranoia  Suicidal Thoughts:Suicidal Thoughts: Yes, Passive SI Passive Intent and/or Plan: Without Intent  Homicidal Thoughts:Homicidal Thoughts: Yes, Passive HI Passive Intent and/or Plan: Without Intent   Sensorium  Memory:Immediate Fair; Recent Fair; Remote Fair  Judgment:Impaired  Insight:Lacking   Executive Functions  Concentration:Fair  Attention Span:Fair  Recall:Fair  Fund of Knowledge:Fair  Language:Fair   Psychomotor Activity  Psychomotor Activity:Psychomotor Activity: Normal   Assets  Assets:Desire for Improvement; Resilience   Sleep  Sleep:Sleep: Fair Number of Hours of Sleep: 4.75    Physical Exam: Physical Exam Vitals and nursing note reviewed.  HENT:     Head: Normocephalic and atraumatic.  Pulmonary:     Effort: Pulmonary effort is  normal.  Neurological:     General: No focal deficit present.     Mental Status: He is alert and oriented to person, place, and time.    ROS Blood pressure 101/67, pulse 94, temperature 97.8 F (36.6 C), temperature source Oral, resp. rate 16, height 5' 8.5" (1.74 m), weight 82.6 kg, SpO2 99 %. Body mass index is 27.27 kg/m.   Treatment Plan Summary: Daily contact with patient to assess and evaluate symptoms and progress in treatment, Medication management and Plan : Patient is seen and examined.  Patient is a 45 year old male with the above-stated past psychiatric history who is seen in follow-up.   Diagnosis: 1.  Bipolar disorder, most recently depressed 2.  Cocaine dependence 3.  Alcohol use disorder 4.  Antisocial personality disorder  Pertinent findings on examination today: 1.  Continues to voice vague suicidal and homicidal ideation. 2.  Decreased irritability. 3.  Sleep is still not great.  Plan: 1.  Increase Abilify to 15 mg p.o. daily for mood stability. 2.  Continue Depakote ER 1000 mg p.o. nightly for mood stability. 3.  Continue hydroxyzine 25 mg p.o. 3 times daily as needed anxiety. 4.  Continue Zyprexa agitation protocol as needed. 5.  Increase trazodone to 100 mg p.o. nightly as needed insomnia. 6.  Social work is inquiring about the possibility of residential treatment programs. 7.  Disposition planning-progress.  Antonieta Pert, MD 01/26/2021, 11:46 AM

## 2021-01-26 NOTE — BHH Counselor (Signed)
CSW faxed referral to [facility] for possible placement at their facility.       Ruthann Cancer MSW, LCSW Clincal Social Worker  [facility]

## 2021-01-26 NOTE — Progress Notes (Signed)
Recreation Therapy Notes  Date: 4.6.22 Time: 0930 Location: [address] Dayroom  Group Topic: Stress Management   Goal Area(s) Addresses:  Patient will actively participate in stress management techniques presented during session.  Patient will successfully identify benefit of practicing stress management post d/c.   Intervention: Guided exercise with ambient sound and script  Activity :Guided Imagery  LRT read a script that lead patients to envision floating on a cloud and going to any place they desire.  Patients were to listen to follow along as script was read to fully participate in activity.  Education:  Stress Management, Discharge Planning.   Education Outcome: Acknowledges education  Clinical Observations/Feedback: Pt did not attend group session.    [name], LRT/CTRS         [name], [name] A 01/26/2021 11:12 AM

## 2021-01-26 NOTE — Progress Notes (Signed)
   01/26/21 1200  Psych Admission Type (Psych Patients Only)  Admission Status Voluntary  Psychosocial Assessment  Patient Complaints Depression;Anxiety  Eye Contact Brief  Facial Expression Anxious  Affect Appropriate to circumstance  Speech Logical/coherent  Interaction Guarded;Assertive  Motor Activity Other (Comment) (WDL)  Appearance/Hygiene Disheveled  Behavior Characteristics Cooperative;Anxious;Calm  Mood Anxious;Depressed  Thought Process  Coherency WDL  Content Blaming others  Delusions None reported or observed  Perception WDL  Hallucination None reported or observed  Judgment Impaired  Confusion None  Danger to Self  Current suicidal ideation? Denies  Self-Injurious Behavior No self-injurious ideation or behavior indicators observed or expressed   Agreement Not to Harm Self Yes  Description of Agreement verbal contract  Danger to Others  Danger to Others Reported or observed  Danger to Others Abnormal  Harmful Behavior to others No threats or harm toward other people

## 2021-01-27 DIAGNOSIS — F3162 Bipolar disorder, current episode mixed, moderate: Secondary | ICD-10-CM

## 2021-01-27 MED ORDER — ARIPIPRAZOLE 15 MG PO TABS: 15.0000 mg | ORAL_TABLET | Freq: Every day | ORAL | 0 refills | Status: DC

## 2021-01-27 MED ORDER — DIVALPROEX SODIUM ER 500 MG PO TB24: 1000.0000 mg | ORAL_TABLET | Freq: Every day | ORAL | 0 refills | Status: DC

## 2021-01-27 NOTE — Plan of Care (Signed)
Nurse discussed coping skills with patient.  

## 2021-01-27 NOTE — Progress Notes (Signed)
Discharge Note:  Patient discharged.  Denied SI and HI.  Denied A/V hallucinations.  Suicide prevention information given and discussed with patient who stated he understood and had no questions.  Patient received all his belongings.

## 2021-01-27 NOTE — Progress Notes (Signed)
[facility] MD Progress Note  01/27/2021 12:01 PM Cory Floyd  MRN:  [id number] Subjective:  Patient is a 45 year old male with a reported past psychiatric history significant for bipolar disorder as well as polysubstance use disorders who was admitted on 01/25/2021 secondary to suicidal ideation and homicidal ideation.  Objective: Patient is seen and examined.  Patient is a 45 year old male with the above-stated past psychiatric history is seen in follow-up.  He denies suicidal ideation today.  His sleep is still not great.  He remains vague on his homicidal ideation.  We discussed the fact that he was attempting to get into a substance abuse residential program.  He denied any side effects to his current medications.  His vital signs are stable, he is afebrile.  No new laboratories.  Principal Problem: <principal problem not specified> Diagnosis: Active Problems:   Bipolar disorder (HCC)  Total Time spent with patient: 15 minutes  Past Psychiatric History: See admission H&P  Past Medical History:  Past Medical History:  Diagnosis Date  . 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 Anginal pain (HCC)   . Arthritis   . Bipolar 1 disorder (HCC)   . COVID-19   . Depressed   . Diabetes mellitus without complication (HCC)   . Dyspnea   . Fatty liver   . Hypertension   . Schizophrenia [facility])     Past Surgical History:  Procedure Laterality Date  . COLONOSCOPY  02/05/2014   diverticulosis, hyperplastic polyp x 2  . fracture arm Right   . KNEE SURGERY Right 2013  . NASAL SEPTUM SURGERY    . NASAL TURBINATE REDUCTION Bilateral 12/12/2016   Procedure: TURBINATE REDUCTION/SUBMUCOSAL RESECTION;  Surgeon: Linus Salmons, MD;  Location: [facility] ORS;  Service: ENT;  Laterality: Bilateral;   Family History:  Family History  Problem Relation Age of Onset  . Hepatitis C Mother   . Cancer Mother   . Cancer Maternal Aunt   . Hypertension Maternal Grandmother    Family Psychiatric  History: See  admission H&P Social History:  Social History   Substance and Sexual Activity  Alcohol Use Yes   Comment: 1/2-5th pint of liquor     Social History   Substance and Sexual Activity  Drug Use Yes  . Types: Cocaine, Marijuana   Comment: heroin    Social History   Socioeconomic History  . Marital status: Divorced    Spouse name: Not on file  . Number of children: Not on file  . Years of education: Not on file  . Highest education level: Not on file  Occupational History  . Not on file  Tobacco Use  . Smoking status: Current Every Day Smoker    Packs/day: 1.00    Years: 20.00    Pack years: 20.00    Types: Cigarettes  . Smokeless tobacco: Never Used  Vaping Use  . Vaping Use: Never used  Substance and Sexual Activity  . Alcohol use: Yes    Comment: 1/2-5th pint of liquor  . Drug use: Yes    Types: Cocaine, Marijuana    Comment: heroin  . Sexual activity: Yes    Birth control/protection: None  Other Topics Concern  . Not on file  Social History Narrative  . Not on file   Social Determinants of Health   Financial Resource Strain: Not on file  Food Insecurity: Not on file  Transportation Needs: Not on file  Physical Activity: Not on file  Stress: Not on file  Social Connections: Not on file   Additional Social History:                         Sleep: Fair  Appetite:  Good  Current Medications: Current Facility-Administered Medications  Medication Dose Route Frequency Provider Last Rate Last Admin  . acetaminophen (TYLENOL) tablet 650 mg  650 mg Oral Q6H PRN Estella Husk, MD   650 mg at 01/27/21 0351  . alum & mag hydroxide-simeth (MAALOX/MYLANTA) 200-200-20 MG/5ML suspension 30 mL  30 mL Oral Q4H PRN Estella Husk, MD      . ARIPiprazole (ABILIFY) tablet 15 mg  15 mg Oral Daily Antonieta Pert, MD   15 mg at 01/27/21 0813  . divalproex (DEPAKOTE ER) 24 hr tablet 1,000 mg  1,000 mg Oral QHS Estella Husk, MD   1,000 mg at  01/26/21 2145  . hydrOXYzine (ATARAX/VISTARIL) tablet 25 mg  25 mg Oral TID PRN Estella Husk, MD   25 mg at 01/26/21 2145  . OLANZapine zydis (ZYPREXA) disintegrating tablet 10 mg  10 mg Oral Q8H PRN Antonieta Pert, MD       And  . LORazepam (ATIVAN) tablet 1 mg  1 mg Oral Q6H PRN Antonieta Pert, MD       And  . ziprasidone (GEODON) injection 20 mg  20 mg Intramuscular Q6H PRN Antonieta Pert, MD      . magnesium hydroxide (MILK OF MAGNESIA) suspension 30 mL  30 mL Oral Daily PRN Estella Husk, MD      . nicotine polacrilex (NICORETTE) gum 2 mg  2 mg Oral PRN Antonieta Pert, MD      . traZODone (DESYREL) tablet 100 mg  100 mg Oral QHS PRN Antonieta Pert, MD   100 mg at 01/26/21 2145    Lab Results: No results found for this or any previous visit (from the past 48 hour(s)).  Blood Alcohol level:  Lab Results  Component Value Date   ETH <10 01/22/2021   ETH <10 10/21/2020    Metabolic Disorder Labs: Lab Results  Component Value Date   HGBA1C 5.8 (H) 01/23/2021   MPG 119.76 01/23/2021   MPG 119.76 10/26/2019   No results found for: PROLACTIN Lab Results  Component Value Date   CHOL 187 01/23/2021   TRIG 61 01/23/2021   HDL 70 01/23/2021   CHOLHDL 2.7 01/23/2021   VLDL 12 01/23/2021   LDLCALC 105 (H) 01/23/2021    Physical Findings: AIMS: Facial and Oral Movements Muscles of Facial Expression: None, normal Lips and Perioral Area: None, normal Jaw: None, normal Tongue: None, normal,Extremity Movements Upper (arms, wrists, hands, fingers): None, normal Lower (legs, knees, ankles, toes): None, normal, Trunk Movements Neck, shoulders, hips: None, normal, Overall Severity Severity of abnormal movements (highest score from questions above): None, normal Incapacitation due to abnormal movements: None, normal Patient's awareness of abnormal movements (rate only patient's report): No Awareness, Dental Status Current problems with teeth and/or  dentures?: Yes Does patient usually wear dentures?: No  CIWA:    COWS:     Musculoskeletal: Strength & Muscle Tone: within normal limits Gait & Station: normal Patient leans: N/A  Psychiatric Specialty Exam:  Presentation  General Appearance: Disheveled  Eye Contact:Fair  Speech:Normal Rate  Speech Volume:Decreased  Handedness:Right   Mood and Affect  Mood:Euthymic  Affect:Congruent   Thought Process  Thought Processes:Coherent  Descriptions of Associations:Circumstantial  Orientation:Full (Time, Place and  Person)  Thought Content:Logical  History of Schizophrenia/Schizoaffective disorder:No  Duration of Psychotic Symptoms:Less than six months  Hallucinations:Hallucinations: None  Ideas of Reference:None  Suicidal Thoughts:Suicidal Thoughts: Yes, Passive SI Passive Intent and/or Plan: Without Intent  Homicidal Thoughts:Homicidal Thoughts: Yes, Passive HI Passive Intent and/or Plan: Without Intent   Sensorium  Memory:Immediate Fair; Recent Fair; Remote Fair  Judgment:Poor  Insight:Lacking   Executive Functions  Concentration:Good  Attention Span:Good  Recall:Fair  Fund of Knowledge:Fair  Language:Good   Psychomotor Activity  Psychomotor Activity:Psychomotor Activity: Normal   Assets  Assets:Desire for Improvement; Resilience   Sleep  Sleep:Sleep: Poor Number of Hours of Sleep: 3.75    Physical Exam: Physical Exam Vitals and nursing note reviewed.  HENT:     Head: Normocephalic and atraumatic.  Pulmonary:     Effort: Pulmonary effort is normal.  Neurological:     General: No focal deficit present.     Mental Status: He is alert and oriented to person, place, and time.    ROS Blood pressure (!) 86/59, pulse 98, temperature 97.9 F (36.6 C), temperature source Oral, resp. rate 16, height 5' 8.5" (1.74 m), weight 82.6 kg, SpO2 99 %. Body mass index is 27.27 kg/m.   Treatment Plan Summary: Daily contact with patient  to assess and evaluate symptoms and progress in treatment, Medication management and Plan : Patient is seen and examined.  Patient is a 45 year old male with the above-stated past psychiatric history who is seen in follow-up.  Diagnosis: 1.  Bipolar disorder, most recently depressed 2.  Cocaine dependence 3.  Alcohol use disorder 4.  Antisocial personality disorder  Pertinent findings on examination today: 1.  Denies suicidal ideation. 2.  Sleep is still not great, but sleeps a great deal during the day. 3.  Irritability has decreased.  Plan: 1.    Continue Abilify to 15 mg p.o. daily for mood stability. 2.  Continue Depakote ER 1000 mg p.o. nightly for mood stability. 3.  Continue hydroxyzine 25 mg p.o. 3 times daily as needed anxiety. 4.  Continue Zyprexa agitation protocol as needed. 5.  Increase trazodone to 100 mg p.o. nightly as needed insomnia. 6.  Social work is inquiring about the possibility of residential treatment programs. 7.  Disposition planning-progress. Antonieta Pert, MD 01/27/2021, 12:01 PM

## 2021-01-27 NOTE — BHH Counselor (Signed)
CSW received a voicemail from Ms. June at [facility] saying that this patient has been declined at their facility.    Ruthann Cancer MSW, LCSW Clincal Social Worker  [facility]

## 2021-01-27 NOTE — Discharge Summary (Signed)
Physician Discharge Summary Note  Patient:  Cory Floyd is an 45 y.o., male MRN:  [id number] DOB:  16-May-1976 Patient phone:  [phone] (home)  Patient address:   [address] [place] [address],   Total Time spent with patient: Greater than 30 minutes  Date of Admission:  01/24/2021  Date of Discharge: 01-27-21  Reason for Admission: Aggressive behaviors towards a residential property & suicidal/homicidal threats.  Principal Problem: Bipolar disorder [facility])  Discharge Diagnoses: Principal Problem:   Bipolar disorder [facility])  Past Psychiatric History: Bipolar 1 disorder  Past Medical History:  Past Medical History:  Diagnosis Date  . 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 Anginal pain (HCC)   . Arthritis   . Bipolar 1 disorder (HCC)   . COVID-19   . Depressed   . Diabetes mellitus without complication (HCC)   . Dyspnea   . Fatty liver   . Hypertension   . Schizophrenia [facility])     Past Surgical History:  Procedure Laterality Date  . COLONOSCOPY  02/05/2014   diverticulosis, hyperplastic polyp x 2  . fracture arm Right   . KNEE SURGERY Right 2013  . NASAL SEPTUM SURGERY    . NASAL TURBINATE REDUCTION Bilateral 12/12/2016   Procedure: TURBINATE REDUCTION/SUBMUCOSAL RESECTION;  Surgeon: Linus Salmons, MD;  Location: [facility] ORS;  Service: ENT;  Laterality: Bilateral;   Family History:  Family History  Problem Relation Age of Onset  . Hepatitis C Mother   . Cancer Mother   . Cancer Maternal Aunt   . Hypertension Maternal Grandmother    Family Psychiatric  History: See H&P  Social History:  Social History   Substance and Sexual Activity  Alcohol Use Yes   Comment: 1/2-5th pint of liquor     Social History   Substance and Sexual Activity  Drug Use Yes  . Types: Cocaine, Marijuana   Comment: heroin    Social History   Socioeconomic History  . Marital status: Divorced    Spouse name: Not on file  . Number of children: Not on file  . Years  of education: Not on file  . Highest education level: Not on file  Occupational History  . Not on file  Tobacco Use  . Smoking status: Current Every Day Smoker    Packs/day: 1.00    Years: 20.00    Pack years: 20.00    Types: Cigarettes  . Smokeless tobacco: Never Used  Vaping Use  . Vaping Use: Never used  Substance and Sexual Activity  . Alcohol use: Yes    Comment: 1/2-5th pint of liquor  . Drug use: Yes    Types: Cocaine, Marijuana    Comment: heroin  . Sexual activity: Yes    Birth control/protection: None  Other Topics Concern  . Not on file  Social History Narrative  . Not on file   Social Determinants of Health   Financial Resource Strain: Not on file  Food Insecurity: Not on file  Transportation Needs: Not on file  Physical Activity: Not on file  Stress: Not on file  Social Connections: Not on file   Hospital Course: (Per Md's admission evaluation notes): Patient is a 45 year old male with a reported past psychiatric history significant for bipolar disorder as well as polysubstance use disorders who originally presented to the behavioral health urgent care center on 01/22/2021 after police had received a call from an owner of a boardinghouse for a man that was in his house causing  damage to the home. Once police arrived he jumped out of a window falling approximately 12 feet. On evaluation in the emergency department, he stated that he was going to kill the owner of the house, and if not he was going to just kill himself. He was transferred to the [facility]. He is very disorganized on his history. He goes back to years and talks about the fact that he was in this transition house, and that the owner of the transition house was selling him drugs, and he would do work to get those drugs. He has previously been followed by an [facility] He previously has been treated with Depakote, Abilify, Trazodone and Remeron. He  was transferred to our facility on 01/24/2021. He goes into a long dialogue today about the last 2 years. Review of the electronic medical record revealed multiple emergency room visits as well as visits to the behavioral health urgent care center. Prior to this most recent visit he was seen at the behavioral health urgent care center last on 10/21/2020. His discharge medications at that point included Depakote DR 500 mg p.o. nightly and 250 mg p.o. daily as well as Risperdal 2 mg p.o. every 8 hours as needed, mirtazapine 15 mg p.o. nightly and standing Risperdal 2 mg p.o. nightly. He was admitted to the hospital for evaluation and stabilization.  This is one of several psychiatric admissions/discharge summaries in the [facility] for this 45 year old male with hx of chronic mental illness, substance use disorder & multiple psychiatric admissions. He is known in the [facility] health systems including the ED for worsening symptoms of Bipolar disorder. He has been tried on multiple psychotropic medications for his symptoms & it appeared Cory Floyd may not have been compliant to his medication regimen. He was brought to the [facility] this time around for evaluation & treatment for aggressive behaviors towards a residential property & suicidal/homicidal threats.  After evaluation of his presenting symptoms, Cory Floyd was recommended for mood stabilization treatments. The medication regimen for his presenting symptoms were discussed & with his consent initiated. He received, stabilized & was discharged on the medications as listed below on his discharge medication lists. He was also enrolled & participated in the group counseling sessions being offered & held on this unit. He learned coping skills. He presented on this admission, no other chronic medical conditions that required treatment & monitoring. He tolerated his treatment regimen without any adverse effects or reactions reported. Cory Floyd's symptoms responded well to his  treatment regimen warranting this discharge.  During the course of his hospitalization, the 15-minute checks were adequate to ensure Chidubem's safety.  Patient did not display any dangerous, violent or suicidal behavior on the unit. He interacted with patients & staff appropriately. He participated appropriately in the group sessions/therapies. His medications were addressed & adjusted to meet his needs. He was recommended for outpatient follow-up care & medication management upon discharge to assure his continuity of care.  At the time of discharge, patient is not reporting any acute suicidal/homicidal ideations. He currently denies any new issues or concerns. Education and supportive counseling provided throughout her hospital stay & upon discharge.  Today upon his discharge evaluation with the attending psychiatrist, Cordae shares he is doing well. He denies any other specific concerns. He is sleeping well. His appetite is good. He denies other physical complaints. He denies AH/VH, delusional thoughts or paranoia. He feels that his medications have been helpful & is in agreement  to continue his current treatment regimen as recommended. He was able to engage in safety planning including [facility] or contact emergency services if he feels unable to maintain his own safety or the safety of others. Pt had no further questions, comments, or concerns. [facility] all personal belongings in no apparent distress. Transportation per the [facility] transport services.  Physical Findings: AIMS: Facial and Oral Movements Muscles of Facial Expression: None, normal Lips and Perioral Area: None, normal Jaw: None, normal Tongue: None, normal,Extremity Movements Upper (arms, wrists, hands, fingers): None, normal Lower (legs, knees, ankles, toes): None, normal, Trunk Movements Neck, shoulders, hips: None, normal, Overall Severity Severity of abnormal movements (highest score from questions above): None,  normal Incapacitation due to abnormal movements: None, normal Patient's awareness of abnormal movements (rate only patient's report): No Awareness, Dental Status Current problems with teeth and/or dentures?: Yes Does patient usually wear dentures?: No  CIWA:    COWS:     Musculoskeletal: Strength & Muscle Tone: within normal limits Gait & Station: normal Patient leans: N/A  Psychiatric Specialty Exam:  Presentation  General Appearance: Fairly groomed.  Eye Contact:Fair  Speech:Normal Rate  Speech Volume:Decreased  Handedness:Right  Mood and Affect  Mood:Euthymic  Affect:Congruent   Thought Process  Thought Processes:Coherent  Descriptions of Associations: Intact  Orientation:Full (Time, Place and Person)  Thought Content:Logical  History of Schizophrenia/Schizoaffective disorder:No  Duration of Psychotic Symptoms:Less than six months  Hallucinations:Hallucinations: None  Ideas of Reference:None  Suicidal Thoughts:Suicidal Thoughts: No  Homicidal Thoughts:Homicidal Thoughts: No  Sensorium  Memory:Immediate;Good  Recent Good; Remote: Good  Judgment: Good  Insight: Fair  Executive Functions  Concentration:Good  Attention Span:Good  Recall:Fair  Fund of Knowledge:Fair  Language:Good  Psychomotor Activity  Psychomotor Activity:Psychomotor Activity: Normal  Assets  Assets:Desire for Improvement; Resilience  Sleep  Sleep:Sleep: Good  Physical Exam: Physical Exam Vitals and nursing note reviewed.  HENT:     Head: Normocephalic.     Nose: Nose normal.     Mouth/Throat:     Pharynx: Oropharynx is clear.  Eyes:     Pupils: Pupils are equal, round, and reactive to light.  Cardiovascular:     Rate and Rhythm: Normal rate.     Pulses: Normal pulses.  Pulmonary:     Effort: Pulmonary effort is normal.  Genitourinary:    Comments: Deferred Musculoskeletal:        General: Normal range of motion.     Cervical back: Normal range of  motion.  Skin:    General: Skin is warm and dry.  Neurological:     General: No focal deficit present.     Mental Status: He is alert and oriented to person, place, and time. Mental status is at baseline.    Review of Systems  Constitutional: Negative for chills, diaphoresis and fever.  HENT: Negative for congestion and sore throat.   Eyes: Negative for blurred vision.  Respiratory: Negative for cough, shortness of breath and wheezing.   Cardiovascular: Negative for chest pain and palpitations.  Gastrointestinal: Negative for abdominal pain, constipation, diarrhea, heartburn, nausea and vomiting.  Genitourinary: Negative for dysuria.  Musculoskeletal: Negative for joint pain and myalgias.  Skin: Negative.   Neurological: Negative for dizziness, tingling, tremors, sensory change, speech change, focal weakness, seizures, loss of consciousness, weakness and headaches.  Endo/Heme/Allergies:       Allergies: PCN   Food allergy: Carrots  Psychiatric/Behavioral: Positive for substance abuse (Hx. Cocaine use disorder). Negative for depression, hallucinations, memory  loss and suicidal ideas. The patient is not nervous/anxious and does not have insomnia.    Blood pressure (!) 86/59, pulse 98, temperature 97.9 F (36.6 C), temperature source Oral, resp. rate 16, height 5' 8.5" (1.74 m), weight 82.6 kg, SpO2 99 %. Body mass index is 27.27 kg/m.  Have you used any form of tobacco in the last 30 days? (Cigarettes, Smokeless Tobacco, Cigars, and/or Pipes): Yes  Has this patient used any form of tobacco in the last 30 days? (Cigarettes, Smokeless Tobacco, Cigars, and/or Pipes): N/A  Blood Alcohol level:  Lab Results  Component Value Date   ETH <10 01/22/2021   ETH <10 10/21/2020   Metabolic Disorder Labs:  Lab Results  Component Value Date   HGBA1C 5.8 (H) 01/23/2021   MPG 119.76 01/23/2021   MPG 119.76 10/26/2019   No results found for: PROLACTIN Lab Results  Component Value Date    CHOL 187 01/23/2021   TRIG 61 01/23/2021   HDL 70 01/23/2021   CHOLHDL 2.7 01/23/2021   VLDL 12 01/23/2021   LDLCALC 105 (H) 01/23/2021   See Psychiatric Specialty Exam and Suicide Risk Assessment completed by Attending Physician prior to discharge.  Discharge destination:  Home  Is patient on multiple antipsychotic therapies at discharge:  No   Has Patient had three or more failed trials of antipsychotic monotherapy by history:  No  Recommended Plan for Multiple Antipsychotic Therapies: NA  Discharge Instructions    Diet - low sodium heart healthy   Complete by: As directed    Increase activity slowly   Complete by: As directed      Allergies as of 01/27/2021      Reactions   Carrot [daucus Carota] Anaphylaxis, Rash   Penicillins Anaphylaxis, Hives   Has patient had a PCN reaction causing immediate rash, facial/tongue/throat swelling, SOB or lightheadedness with hypotension: Yes Has patient had a PCN reaction causing severe rash involving mucus membranes or skin necrosis: No Has patient had a PCN reaction that required hospitalization Yes Has patient had a PCN reaction occurring within the last 10 years: No If all of the above answers are "NO", then may proceed with Cephalosporin use.      Medication List    TAKE these medications     Indication  ARIPiprazole 15 MG tablet Commonly known as: ABILIFY Take 1 tablet (15 mg total) by mouth daily. Start taking on: January 28, 2021  Indication: Major Depressive Disorder   divalproex 500 MG 24 hr tablet Commonly known as: DEPAKOTE ER Take 2 tablets (1,000 mg total) by mouth at bedtime.  Indication: Manic Phase of Manic-Depression, MIXED BIPOLAR AFFECTIVE DISORDER       Follow-up Information    [facility] Follow up.   Why: [facility] will reach out to you the beginning of the next week to schedule intake date.  Contact information: [address] [place] [address] [phone]         [facility] Follow up.   Why: [facility] will meet with you at your residence at discharge. Contact information: [address] [place] [address] [phone]              Follow-up recommendations:  Activity:  As tolerated Diet: As recommended by your primary care doctor. Keep all scheduled follow-up appointments as recommended.  Comments: Prescriptions given at discharge.  Patient agreeable to plan.  Given opportunity to ask questions.  Appears to feel comfortable with discharge denies any  current suicidal or homicidal thought. Patient is also instructed prior to discharge to: Take all medications as prescribed by his/her mental healthcare provider. Report any adverse effects and or reactions from the medicines to his/her outpatient provider promptly. Patient has been instructed & cautioned: To not engage in alcohol and or illegal drug use while on prescription medicines. In the event of worsening symptoms, patient is instructed to call the crisis hotline, 911 and or go to the nearest ED for appropriate evaluation and treatment of symptoms. To follow-up with his/her primary care provider for your other medical issues, concerns and or health care needs.  Signed: Armandina Stammer, NP, PMHNP, FNP-BC 01/27/2021, 6:32 PM

## 2021-01-27 NOTE — BHH Counselor (Signed)
CSW left voicemails for both [facility] and [facility] regarding bed availability.   CSW will await return call.   Ruthann Cancer MSW, LCSW Clincal Social Worker  [facility]

## 2021-01-27 NOTE — Progress Notes (Signed)
  [facility] Adult Case Management Discharge Plan :  Will you be returning to the same living situation after discharge:  Yes,  to [facility] At discharge, do you have transportation home?: No. Safe Transport will be arranged Do you have the ability to pay for your medications: Yes,  has insurance  Release of information consent forms completed and in the chart;  Patient's signature needed at discharge.  Patient to Follow up at:  Follow-up Information    [facility] Follow up.   Why: [facility] will reach out to you the beginning of the next week to schedule intake date.  Contact information: [address] [place] [address] [phone]        [facility] Follow up.   Why: ACTT will meet with you at your residence at discharge. Contact information: [address] [place] [place] [address] [phone]               Next level of care provider has access to [facility] Link:no  Safety Planning and Suicide Prevention discussed: Yes,  with patient  Have you used any form of tobacco in the last 30 days? (Cigarettes, Smokeless Tobacco, Cigars, and/or Pipes): Yes  Has patient been referred to the Quitline?: Patient refused referral  Patient has been referred for addiction treatment: Yes  Otelia Santee, LCSW 01/27/2021, 4:09 PM

## 2021-01-27 NOTE — Care Management Important Message (Signed)
Medicare IM given to social work to give to the patient 

## 2021-01-27 NOTE — BHH Suicide Risk Assessment (Signed)
[facility] Discharge Suicide Risk Assessment   Principal Problem: <principal problem not specified> Discharge Diagnoses: Active Problems:   Bipolar disorder (HCC)   Total Time spent with patient: 20 minutes  Musculoskeletal: Strength & Muscle Tone: within normal limits Gait & Station: normal Patient leans: N/A  Psychiatric Specialty Exam: Review of Systems  All other systems reviewed and are negative.   Blood pressure (!) 86/59, pulse 98, temperature 97.9 F (36.6 C), temperature source Oral, resp. rate 16, height 5' 8.5" (1.74 m), weight 82.6 kg, SpO2 99 %.Body mass index is 27.27 kg/m.  General Appearance: Disheveled  Eye Solicitor::  Fair  Speech:  Normal Rate409  Volume:  Normal  Mood:  Euthymic  Affect:  Congruent  Thought Process:  Coherent and Descriptions of Associations: Intact  Orientation:  Full (Time, Place, and Person)  Thought Content:  Logical  Suicidal Thoughts:  No  Homicidal Thoughts:  No  Memory:  Immediate;   Fair Recent;   Fair Remote;   Fair  Judgement:  Intact  Insight:  Lacking  Psychomotor Activity:  Normal  Concentration:  Fair  Recall:  [employer] of Knowledge:Fair  Language: Fair  Akathisia:  Negative  Handed:  Right  AIMS (if indicated):     Assets:  Desire for Improvement Resilience  Sleep:  Number of Hours: 3.75  Cognition: WNL  ADL's:  Intact   Mental Status Per Nursing Assessment::   On Admission:  Suicidal ideation indicated by patient  Demographic Factors:  Male, Divorced or widowed, Caucasian, Low socioeconomic status and Living alone  Loss Factors: Financial problems/change in socioeconomic status  Historical Factors: Impulsivity  Risk Reduction Factors:   NA  Continued Clinical Symptoms:  Bipolar Disorder:   Mixed State Alcohol/Substance Abuse/Dependencies  Cognitive Features That Contribute To Risk:  None    Suicide Risk:  Minimal: No identifiable suicidal ideation.  Patients presenting with no risk factors but  with morbid ruminations; may be classified as minimal risk based on the severity of the depressive symptoms   Follow-up Information    [facility] Follow up.   Why: referral made Contact information: [address] [place] [address] [phone]        [facility] Follow up.   Contact information: [address] [place] [address] [phone]               Plan Of Care/Follow-up recommendations:  Activity:  ad lib  Antonieta Pert, MD 01/27/2021, 3:56 PM

## 2021-01-27 NOTE — BHH Counselor (Signed)
CSW spoke with [place] with [facility] who reported that they have no bed availability until at least Tuesday of next week and will need to complete the pre-screening with pt if still interested.    Ruthann Cancer MSW, LCSW Clincal Social Worker  [facility]

## 2021-03-03 ENCOUNTER — Encounter (HOSPITAL_COMMUNITY): Payer: Self-pay

## 2021-03-03 ENCOUNTER — Other Ambulatory Visit: Payer: Self-pay

## 2021-03-03 ENCOUNTER — Emergency Department (HOSPITAL_COMMUNITY)
Admission: EM | Admit: 2021-03-03 | Discharge: 2021-03-04 | Disposition: A | Discharge status: Home / Self Care | Payer: Medicare (Managed Care) | Attending: Emergency Medicine | Admitting: Emergency Medicine

## 2021-03-03 DIAGNOSIS — E119 Type 2 diabetes mellitus without complications: Secondary | ICD-10-CM | POA: Insufficient documentation

## 2021-03-03 DIAGNOSIS — R451 Restlessness and agitation: Secondary | ICD-10-CM | POA: Diagnosis not present

## 2021-03-03 DIAGNOSIS — R440 Auditory hallucinations: Secondary | ICD-10-CM | POA: Diagnosis not present

## 2021-03-03 DIAGNOSIS — R45851 Suicidal ideations: Secondary | ICD-10-CM | POA: Insufficient documentation

## 2021-03-03 DIAGNOSIS — Z8616 Personal history of COVID-19: Secondary | ICD-10-CM | POA: Diagnosis not present

## 2021-03-03 DIAGNOSIS — R441 Visual hallucinations: Secondary | ICD-10-CM | POA: Insufficient documentation

## 2021-03-03 DIAGNOSIS — F419 Anxiety disorder, unspecified: Secondary | ICD-10-CM | POA: Insufficient documentation

## 2021-03-03 DIAGNOSIS — Y900 Blood alcohol level of less than 20 mg/100 ml: Secondary | ICD-10-CM | POA: Diagnosis not present

## 2021-03-03 DIAGNOSIS — F32A Depression, unspecified: Secondary | ICD-10-CM | POA: Insufficient documentation

## 2021-03-03 DIAGNOSIS — I10 Essential (primary) hypertension: Secondary | ICD-10-CM | POA: Diagnosis not present

## 2021-03-03 DIAGNOSIS — F17210 Nicotine dependence, cigarettes, uncomplicated: Secondary | ICD-10-CM | POA: Diagnosis not present

## 2021-03-03 DIAGNOSIS — Z20822 Contact with and (suspected) exposure to covid-19: Secondary | ICD-10-CM | POA: Insufficient documentation

## 2021-03-03 DIAGNOSIS — F1910 Other psychoactive substance abuse, uncomplicated: Secondary | ICD-10-CM | POA: Insufficient documentation

## 2021-03-03 NOTE — ED Provider Notes (Signed)
Emergency Medicine Provider Triage Evaluation Note  Cory Floyd , a 45 y.o. male  was evaluated in triage.  Pt complains of schizophrenia, bipolar 1 disorder, DM type II, depression presents to the ER with complaints of suicidal ideation.  Patient reports that he has been struggling with his mental health and has not been taking his medication since his mother died.  He reports that he had increased suicidal thoughts today, and states he has access to a gun that he can easily use.  He was afraid that he was not be able to "make it through the night".  He reports smoking cocaine several days ago, and recently had 1 beer.  He states that he is scared as he continues to hear voices that tell him to hurt himself.  He reports he does not want to live like this anymore.  Reports he has a meeting with an act team tomorrow to help assist with his living situation but is afraid that he may attempt to hurt himself before then  Review of Systems  Positive:  Negative:   Physical Exam  BP 111/76   Pulse 85   Temp 97.8 F (36.6 C)   Resp 16   SpO2 98%  Gen:   Awake, no distress   Resp:  Normal effort  MSK:   Moves extremities without difficulty  Other:    Medical Decision Making  Medically screening exam initiated at 11:53 PM.  Appropriate orders placed.  Cory Floyd was informed that the remainder of the evaluation will be completed by another provider, this initial triage assessment does not replace that evaluation, and the importance of remaining in the ED until their evaluation is complete.     Mare Ferrari, [name] 03/03/21 2355    Mancel Bale, MD 03/04/21 1254

## 2021-03-03 NOTE — ED Triage Notes (Addendum)
Pt to ED with c/o SI (Plan to shoot himself, has a gun at home). Pt also endorses auditory and visual hallucinations. Pt states the voices he hears are telling him that people are after him. Pt has been off his meds for  A couple of months. Pt endorses alcohol consumption and smoking crack today. Arrives A+O, VSS, NADN.

## 2021-03-04 LAB — CBC WITH DIFFERENTIAL/PLATELET
Abs Immature Granulocytes: 0.01 10*3/uL (ref 0.00–0.07)
Basophils Absolute: 0 10*3/uL (ref 0.0–0.1)
Basophils Relative: 1 %
Eosinophils Absolute: 0 10*3/uL (ref 0.0–0.5)
Eosinophils Relative: 1 %
HCT: 40.4 % (ref 39.0–52.0)
Hemoglobin: 13 g/dL (ref 13.0–17.0)
Immature Granulocytes: 0 %
Lymphocytes Relative: 18 %
Lymphs Abs: 1.2 10*3/uL (ref 0.7–4.0)
MCH: 30.7 pg (ref 26.0–34.0)
MCHC: 32.2 g/dL (ref 30.0–36.0)
MCV: 95.5 fL (ref 80.0–100.0)
Monocytes Absolute: 0.6 10*3/uL (ref 0.1–1.0)
Monocytes Relative: 9 %
Neutro Abs: 4.6 10*3/uL (ref 1.7–7.7)
Neutrophils Relative %: 71 %
Platelets: 173 10*3/uL (ref 150–400)
RBC: 4.23 MIL/uL (ref 4.22–5.81)
RDW: 13.2 % (ref 11.5–15.5)
WBC: 6.4 10*3/uL (ref 4.0–10.5)
nRBC: 0 % (ref 0.0–0.2)

## 2021-03-04 LAB — SALICYLATE LEVEL: Salicylate Lvl: <7 mg/dL — ABNORMAL LOW (ref 7.0–30.0)

## 2021-03-04 LAB — COMPREHENSIVE METABOLIC PANEL
ALT: 29 U/L (ref 0–44)
AST: 34 U/L (ref 15–41)
Albumin: 4.4 g/dL (ref 3.5–5.0)
Alkaline Phosphatase: 64 U/L (ref 38–126)
Anion gap: 6 (ref 5–15)
BUN: 20 mg/dL (ref 6–20)
CO2: 27 mmol/L (ref 22–32)
Calcium: 9.1 mg/dL (ref 8.9–10.3)
Chloride: 106 mmol/L (ref 98–111)
Creatinine, Ser: 1.11 mg/dL (ref 0.61–1.24)
GFR, Estimated: >60 mL/min (ref >60)
Glucose, Bld: 103 mg/dL — ABNORMAL HIGH (ref 70–99)
Potassium: 4.3 mmol/L (ref 3.5–5.1)
Sodium: 139 mmol/L (ref 135–145)
Total Bilirubin: 0.4 mg/dL (ref 0.3–1.2)
Total Protein: 7.3 g/dL (ref 6.5–8.1)

## 2021-03-04 LAB — RESP PANEL BY RT-PCR (FLU A&B, COVID) ARPGX2
Influenza A by PCR: NEGATIVE
Influenza B by PCR: NEGATIVE
SARS Coronavirus 2 by RT PCR: NEGATIVE

## 2021-03-04 LAB — ETHANOL: Alcohol, Ethyl (B): <10 mg/dL (ref <10)

## 2021-03-04 LAB — ACETAMINOPHEN LEVEL: Acetaminophen (Tylenol), Serum: <10 ug/mL — ABNORMAL LOW (ref 10–30)

## 2021-03-04 MED ORDER — LORAZEPAM 1 MG PO TABS: 0.0000 mg | ORAL_TABLET | Freq: Two times a day (BID) | ORAL | Status: DC

## 2021-03-04 MED ORDER — DIVALPROEX SODIUM ER 500 MG PO TB24: 1000.0000 mg | ORAL_TABLET | Freq: Every day | ORAL | Status: DC

## 2021-03-04 MED ORDER — THIAMINE HCL 100 MG/ML IJ SOLN: 100.0000 mg | Freq: Every day | INTRAMUSCULAR | Status: DC

## 2021-03-04 MED ORDER — LORAZEPAM 1 MG PO TABS
0.0000 mg | ORAL_TABLET | Freq: Four times a day (QID) | ORAL | Status: DC
  Administered 2021-03-04: 1 mg via ORAL
  Filled 2021-03-04: qty 1

## 2021-03-04 MED ORDER — LORAZEPAM 2 MG/ML IJ SOLN: 0.0000 mg | Freq: Two times a day (BID) | INTRAMUSCULAR | Status: DC

## 2021-03-04 MED ORDER — ARIPIPRAZOLE 5 MG PO TABS
15.0000 mg | ORAL_TABLET | Freq: Every day | ORAL | Status: DC
  Administered 2021-03-04: 15 mg via ORAL
  Filled 2021-03-04: qty 1

## 2021-03-04 MED ORDER — LORAZEPAM 2 MG/ML IJ SOLN: 0.0000 mg | Freq: Four times a day (QID) | INTRAMUSCULAR | Status: DC

## 2021-03-04 MED ORDER — THIAMINE HCL 100 MG PO TABS
100.0000 mg | ORAL_TABLET | Freq: Every day | ORAL | Status: DC
  Administered 2021-03-04: 100 mg via ORAL
  Filled 2021-03-04: qty 1

## 2021-03-04 NOTE — ED Notes (Signed)
Pt given meal tray. Sitter at bedside.

## 2021-03-04 NOTE — BH Assessment (Signed)
TTS received phone call from [facility] ED. Patient's ACTT social worker came to pick patient up. He then starts to say he is suicidal and has access to a gun. Patient clearly stated earlier to [facility] provider and clinician that he does not have possession of a fire arm. He denied any suicidal ideation at that time.  Per Maxie Barb, PMHNP patient continues to be psych cleared and ready for discharge.

## 2021-03-04 NOTE — ED Notes (Signed)
Pt received his belongings, 1 pt belonging bag and 1 black bookbag. Pt changed back into his own clothing

## 2021-03-04 NOTE — BH Assessment (Signed)
This Clinical research associate spoke to patient this date in reference to statement made during assessment that he was in possession of a firearm. Patient states "I did not say I had a gun but I can get one, who can't these days." Patient denies he is in direct possession of a firearm and is vague in reference to where he could obtain that firearm. Patient then stated after this writer ask patient to be more specific in reference to where he could obtain a firearm with patient stating, "I could probably find one somewhere." Again patient is not in direct possession of a firearm.

## 2021-03-04 NOTE — ED Notes (Signed)
Patient changed out into burgundy scrubs. Patient has 1 black backpack and 1 patient belongings bag at the nurses station across from room 18. Patient had jeans, black shirt, black shoes. Patient has a speaker, charger cords, phone in his backpack.

## 2021-03-04 NOTE — Discharge Instructions (Signed)
For your behavioral health needs you are advised to continue treatment with the [facility] ACT Team.  If you are in need of services that they are unable to provide directly for you, they can refer you to the appropriate provider:       [facility]      [address]      [place], [place] [address]      [phone]      24 hour crisis number: [phone]

## 2021-03-04 NOTE — ED Notes (Signed)
Pt's social worker is on the phone w a [facility] team member
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
## 2021-03-04 NOTE — ED Notes (Signed)
Patient given urinal and RN asked patient to provide sample.

## 2021-03-04 NOTE — ED Provider Notes (Signed)
[facility]-EMERGENCY DEPT Provider Note   CSN: [id number] Arrival date & time: 03/03/21  2326     History Chief Complaint  Patient presents with  . Suicidal  . Hallucinations    Cory Floyd is a 45 y.o. male presents to the Emergency Department complaining of gradual, persistent, progressively worsening suicidal ideation.  Patient reports that his mother died 3 years ago when he has been struggling with his mental health since that time.  He has not been consistently taking his medications.  Patient reports today he developed a plan to shoot himself and has access to a gun.  He reports that he has been smoking crack and drinking recently and this has made the situation worse.  He reports that if he is alone he "will not make it through the night."  Patient endorses both auditory and visual hallucinations.  Patient states that he sees shadows and the voices tell him to hurt himself.  Patient reports he does have an act team but they have not been helping him get his medications or with his living situation.  The history is provided by the patient and medical records. No language interpreter was used.       Past Medical History:  Diagnosis Date  . 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 Anginal pain (HCC)   . Arthritis   . Bipolar 1 disorder (HCC)   . COVID-19   . Depressed   . Diabetes mellitus without complication (HCC)   . Dyspnea   . Fatty liver   . Hypertension   . Schizophrenia [facility])     Patient Active Problem List   Diagnosis Date Noted  . Bipolar disorder (HCC) 01/24/2021  . Substance induced mood disorder (HCC) 07/26/2020  . Suicidal ideation   . Polysubstance dependence (HCC) 07/25/2020  . Bipolar affective disorder, depressed, moderate (HCC) 03/06/2020  . Arthritis 09/30/2019  . Gastroesophageal reflux disease without esophagitis 09/21/2016  . Obstructive sleep apnea 09/21/2016  . Morbid obesity with BMI of 40.0-44.9, adult (HCC)  08/15/2016  . Stable angina pectoris (HCC) 08/15/2016    Past Surgical History:  Procedure Laterality Date  . COLONOSCOPY  02/05/2014   diverticulosis, hyperplastic polyp x 2  . fracture arm Right   . KNEE SURGERY Right 2013  . NASAL SEPTUM SURGERY    . NASAL TURBINATE REDUCTION Bilateral 12/12/2016   Procedure: TURBINATE REDUCTION/SUBMUCOSAL RESECTION;  Surgeon: Linus Salmons, MD;  Location: [facility] ORS;  Service: ENT;  Laterality: Bilateral;       Family History  Problem Relation Age of Onset  . Hepatitis C Mother   . Cancer Mother   . Cancer Maternal Aunt   . Hypertension Maternal Grandmother     Social History   Tobacco Use  . Smoking status: Current Every Day Smoker    Packs/day: 1.00    Years: 20.00    Pack years: 20.00    Types: Cigarettes  . Smokeless tobacco: Never Used  Vaping Use  . Vaping Use: Never used  Substance Use Topics  . Alcohol use: Yes    Comment: 1/2-5th pint of liquor  . Drug use: Yes    Types: Cocaine, Marijuana    Comment: heroin    Home Medications Prior to Admission medications   Medication Sig Start Date End Date Taking? Authorizing Provider  ARIPiprazole (ABILIFY) 15 MG tablet Take 1 tablet (15 mg total) by mouth daily. 01/28/21   Antonieta Pert, MD  divalproex (DEPAKOTE ER)  500 MG 24 hr tablet Take 2 tablets (1,000 mg total) by mouth at bedtime. 01/27/21   Antonieta Pert, MD  fluticasone (FLONASE) 50 MCG/ACT nasal spray Place 1 spray into both nostrils daily. Patient not taking: Reported on 01/23/2021 08/24/20 01/23/21  Petrucelli, Lelon Mast R, [name]    Allergies    Carrot [daucus carota] and Penicillins  Review of Systems   Review of Systems  Constitutional: Negative for appetite change, diaphoresis, fatigue, fever and unexpected weight change.  HENT: Negative for mouth sores.   Eyes: Negative for visual disturbance.  Respiratory: Negative for cough, chest tightness, shortness of breath and wheezing.   Cardiovascular: Negative  for chest pain.  Gastrointestinal: Negative for abdominal pain, constipation, diarrhea, nausea and vomiting.  Endocrine: Negative for polydipsia, polyphagia and polyuria.  Genitourinary: Negative for dysuria, frequency, hematuria and urgency.  Musculoskeletal: Negative for back pain and neck stiffness.  Skin: Negative for rash.  Allergic/Immunologic: Negative for immunocompromised state.  Neurological: Negative for syncope, light-headedness and headaches.  Hematological: Does not bruise/bleed easily.  Psychiatric/Behavioral: Positive for agitation, sleep disturbance and suicidal ideas. The patient is nervous/anxious.     Physical Exam Updated Vital Signs BP 111/76   Pulse 85   Temp 97.8 F (36.6 C)   Resp 16   Ht 5\' 8"  (1.727 m)   Wt 82.6 kg   SpO2 98%   BMI 27.69 kg/m   Physical Exam Vitals and nursing note reviewed.  Constitutional:      General: He is not in acute distress.    Appearance: He is not diaphoretic.  HENT:     Head: Normocephalic.  Eyes:     General: No scleral icterus.    Conjunctiva/sclera: Conjunctivae normal.  Cardiovascular:     Rate and Rhythm: Normal rate and regular rhythm.     Pulses: Normal pulses.          Radial pulses are 2+ on the right side and 2+ on the left side.  Pulmonary:     Effort: No tachypnea, accessory muscle usage, prolonged expiration, respiratory distress or retractions.     Breath sounds: No stridor.     Comments: Equal chest rise. No increased work of breathing. Abdominal:     General: There is no distension.     Palpations: Abdomen is soft.     Tenderness: There is no abdominal tenderness. There is no guarding or rebound.  Musculoskeletal:     Cervical back: Normal range of motion.     Comments: Moves all extremities equally and without difficulty.  Skin:    General: Skin is warm and dry.     Capillary Refill: Capillary refill takes less than 2 seconds.  Neurological:     Mental Status: He is alert.     GCS: GCS eye  subscore is 4. GCS verbal subscore is 5. GCS motor subscore is 6.     Comments: Speech is clear and goal oriented.  Psychiatric:        Attention and Perception: He perceives auditory and visual hallucinations.        Mood and Affect: Mood is anxious and depressed.        Behavior: Behavior is agitated. Behavior is not aggressive.        Thought Content: Thought content includes suicidal ideation. Thought content does not include homicidal ideation. Thought content includes suicidal plan. Thought content does not include homicidal plan.     ED Results / Procedures / Treatments   Labs (all labs ordered are  listed, but only abnormal results are displayed) Labs Reviewed  COMPREHENSIVE METABOLIC PANEL - Abnormal; Notable for the following components:      Result Value   Glucose, Bld 103 (*)    All other components within normal limits  ACETAMINOPHEN LEVEL - Abnormal; Notable for the following components:   Acetaminophen (Tylenol), Serum <10 (*)    All other components within normal limits  SALICYLATE LEVEL - Abnormal; Notable for the following components:   Salicylate Lvl <7.0 (*)    All other components within normal limits  RESP PANEL BY RT-PCR (FLU A&B, COVID) ARPGX2  ETHANOL  CBC WITH DIFFERENTIAL/PLATELET  RAPID URINE DRUG SCREEN, HOSP PERFORMED    EKG None  Radiology No results found.  Procedures Procedures   Medications Ordered in ED Medications  LORazepam (ATIVAN) injection 0-4 mg (has no administration in time range)    Or  LORazepam (ATIVAN) tablet 0-4 mg (has no administration in time range)  LORazepam (ATIVAN) injection 0-4 mg (has no administration in time range)    Or  LORazepam (ATIVAN) tablet 0-4 mg (has no administration in time range)  thiamine tablet 100 mg (has no administration in time range)    Or  thiamine (B-1) injection 100 mg (has no administration in time range)  divalproex (DEPAKOTE ER) 24 hr tablet 1,000 mg (has no administration in time  range)  ARIPiprazole (ABILIFY) tablet 15 mg (has no administration in time range)    ED Course  I have reviewed the triage vital signs and the nursing notes.  Pertinent labs & imaging results that were available during my care of the patient were reviewed by me and considered in my medical decision making (see chart for details).    MDM Rules/Calculators/A&P                           Patient presents with auditory and visual hallucinations, suicidal ideation, a plan of suicide and access to a gun.  Patient also with history of polysubstance abuse including drugs and alcohol.  Patient is high risk for self-harm and cannot contract for safety.  He is here voluntarily however if he is to leave, he will need IVC paperwork.  TTS pending.  Labs are overall reassuring.  There is no current medical emergency which requires intervention.  Home medications ordered.   Final Clinical Impression(s) / ED Diagnoses Final diagnoses:  Suicidal ideation  Polysubstance abuse [facility])    Rx / DC Orders ED Discharge Orders    None       Mardene Sayer Boyd Kerbs 03/04/21 0420    Zadie Rhine, MD 03/05/21 0425

## 2021-03-04 NOTE — BH Assessment (Addendum)
BHH Assessment Progress Note  Per Maxie Barb, NP, this voluntary pt does not require psychiatric hospitalization at this time.  Pt is psychiatrically cleared.  Discharge instructions advise pt to continue treatment with the [facility] ACT Team.  At 11:16 I called them and spoke to [place] to arrange for pt to be picked up.  She agrees to call me back with an approximate arrival time.  This is pending as of this writing.  Doylene Canning, MA Triage Specialist [phone]   Addendum:  At 12:05 I spoke to Jens Som with [facility].  He agrees to present at [facility] at 15:00 to pick him up.  This has been staffed with Nehemiah Settle who has psychiatrically cleared pt.  Pollyann Glen, TS agrees to speak to pt about putative gun in his possession.  EDP Kristine Royal, MD and pt's nurse, [name], have been notified.  Doylene Canning, [place] Behavioral Health Coordinator [phone]

## 2021-03-04 NOTE — ED Notes (Signed)
Pt attached to cardiac monitor x2. Resting quietly with eyes closed. Awakens to voice and follows commands. Sitter at bedside with pt in direct line of sight. 1:1. VSS at this time.

## 2021-03-07 ENCOUNTER — Other Ambulatory Visit (HOSPITAL_COMMUNITY): Payer: Self-pay | Admitting: Psychiatry

## 2021-03-14 ENCOUNTER — Other Ambulatory Visit (HOSPITAL_COMMUNITY): Payer: Self-pay | Admitting: Psychiatry

## 2021-04-01 ENCOUNTER — Other Ambulatory Visit: Payer: Self-pay

## 2021-04-01 ENCOUNTER — Emergency Department (HOSPITAL_COMMUNITY)
Admission: EM | Admit: 2021-04-01 | Discharge: 2021-04-02 | Disposition: A | Discharge status: Home / Self Care | Payer: Medicare (Managed Care) | Attending: Emergency Medicine | Admitting: Emergency Medicine

## 2021-04-01 ENCOUNTER — Encounter (HOSPITAL_COMMUNITY): Payer: Self-pay | Admitting: Emergency Medicine

## 2021-04-01 DIAGNOSIS — R45851 Suicidal ideations: Secondary | ICD-10-CM | POA: Insufficient documentation

## 2021-04-01 DIAGNOSIS — I10 Essential (primary) hypertension: Secondary | ICD-10-CM | POA: Insufficient documentation

## 2021-04-01 DIAGNOSIS — F1410 Cocaine abuse, uncomplicated: Secondary | ICD-10-CM | POA: Insufficient documentation

## 2021-04-01 DIAGNOSIS — Z8616 Personal history of COVID-19: Secondary | ICD-10-CM | POA: Insufficient documentation

## 2021-04-01 DIAGNOSIS — F32A Depression, unspecified: Secondary | ICD-10-CM | POA: Insufficient documentation

## 2021-04-01 DIAGNOSIS — Z20822 Contact with and (suspected) exposure to covid-19: Secondary | ICD-10-CM | POA: Insufficient documentation

## 2021-04-01 DIAGNOSIS — Z5900 Homelessness unspecified: Secondary | ICD-10-CM | POA: Insufficient documentation

## 2021-04-01 DIAGNOSIS — F314 Bipolar disorder, current episode depressed, severe, without psychotic features: Secondary | ICD-10-CM | POA: Insufficient documentation

## 2021-04-01 DIAGNOSIS — E119 Type 2 diabetes mellitus without complications: Secondary | ICD-10-CM | POA: Insufficient documentation

## 2021-04-01 DIAGNOSIS — F17210 Nicotine dependence, cigarettes, uncomplicated: Secondary | ICD-10-CM | POA: Insufficient documentation

## 2021-04-01 LAB — CBC WITH DIFFERENTIAL/PLATELET
Abs Immature Granulocytes: 0.02 10*3/uL (ref 0.00–0.07)
Basophils Absolute: 0 10*3/uL (ref 0.0–0.1)
Basophils Relative: 1 %
Eosinophils Absolute: 0.1 10*3/uL (ref 0.0–0.5)
Eosinophils Relative: 2 %
HCT: 41.8 % (ref 39.0–52.0)
Hemoglobin: 13.8 g/dL (ref 13.0–17.0)
Immature Granulocytes: 0 %
Lymphocytes Relative: 15 %
Lymphs Abs: 1.1 10*3/uL (ref 0.7–4.0)
MCH: 30.5 pg (ref 26.0–34.0)
MCHC: 33 g/dL (ref 30.0–36.0)
MCV: 92.3 fL (ref 80.0–100.0)
Monocytes Absolute: 0.7 10*3/uL (ref 0.1–1.0)
Monocytes Relative: 10 %
Neutro Abs: 5.2 10*3/uL (ref 1.7–7.7)
Neutrophils Relative %: 72 %
Platelets: 210 10*3/uL (ref 150–400)
RBC: 4.53 MIL/uL (ref 4.22–5.81)
RDW: 13.4 % (ref 11.5–15.5)
WBC: 7.2 10*3/uL (ref 4.0–10.5)
nRBC: 0 % (ref 0.0–0.2)

## 2021-04-01 LAB — COMPREHENSIVE METABOLIC PANEL
ALT: 26 U/L (ref 0–44)
AST: 30 U/L (ref 15–41)
Albumin: 4.3 g/dL (ref 3.5–5.0)
Alkaline Phosphatase: 67 U/L (ref 38–126)
Anion gap: 9 (ref 5–15)
BUN: 19 mg/dL (ref 6–20)
CO2: 25 mmol/L (ref 22–32)
Calcium: 9.6 mg/dL (ref 8.9–10.3)
Chloride: 106 mmol/L (ref 98–111)
Creatinine, Ser: 1.03 mg/dL (ref 0.61–1.24)
GFR, Estimated: >60 mL/min (ref >60)
Glucose, Bld: 127 mg/dL — ABNORMAL HIGH (ref 70–99)
Potassium: 3.9 mmol/L (ref 3.5–5.1)
Sodium: 140 mmol/L (ref 135–145)
Total Bilirubin: 1.1 mg/dL (ref 0.3–1.2)
Total Protein: 7.6 g/dL (ref 6.5–8.1)

## 2021-04-01 LAB — RAPID URINE DRUG SCREEN, HOSP PERFORMED
Amphetamines: NOT DETECTED
Barbiturates: NOT DETECTED
Benzodiazepines: NOT DETECTED
Cocaine: POSITIVE — AB
Opiates: NOT DETECTED
Tetrahydrocannabinol: NOT DETECTED

## 2021-04-01 LAB — RESP PANEL BY RT-PCR (FLU A&B, COVID) ARPGX2
Influenza A by PCR: NEGATIVE
Influenza B by PCR: NEGATIVE
SARS Coronavirus 2 by RT PCR: NEGATIVE

## 2021-04-01 LAB — ETHANOL: Alcohol, Ethyl (B): <10 mg/dL (ref <10)

## 2021-04-01 MED ORDER — ACETAMINOPHEN 325 MG PO TABS
650.0000 mg | ORAL_TABLET | Freq: Four times a day (QID) | ORAL | Status: DC | PRN
  Administered 2021-04-01: 650 mg via ORAL
  Filled 2021-04-01: qty 2

## 2021-04-01 NOTE — ED Provider Notes (Signed)
[facility] EMERGENCY DEPARTMENT Provider Note  CSN: [id number] Arrival date & time: 04/01/21 0100    History Chief Complaint  Patient presents with   Suicidal    HPI  Cory Floyd is a 45 y.o. male with history of psychiatric and substance abuse problems for years. States he has been depressed and suicidal with plan to use a gun. He states he has access to a gun at his house, but also states he is homeless so it isn't clear what his living situation is. He has had several previous ED visits for same. States his ACT team worker came to check on him today and brought him in for evaluation.   Past Medical History:  Diagnosis Date   Alcoholic (HCC)    clean for 3 years   Anginal pain (HCC)    Arthritis    Bipolar 1 disorder (HCC)    COVID-19    Depressed    Diabetes mellitus without complication (HCC)    Dyspnea    Fatty liver    Hypertension    Schizophrenia (HCC)     Past Surgical History:  Procedure Laterality Date   COLONOSCOPY  02/05/2014   diverticulosis, hyperplastic polyp x 2   fracture arm Right    KNEE SURGERY Right 2013   NASAL SEPTUM SURGERY     NASAL TURBINATE REDUCTION Bilateral 12/12/2016   Procedure: TURBINATE REDUCTION/SUBMUCOSAL RESECTION;  Surgeon: Linus Salmons, MD;  Location: [facility] ORS;  Service: ENT;  Laterality: Bilateral;    Family History  Problem Relation Age of Onset   Hepatitis C Mother    Cancer Mother    Cancer Maternal Aunt    Hypertension Maternal Grandmother     Social History   Tobacco Use   Smoking status: Every Day    Packs/day: 1.00    Years: 20.00    Pack years: 20.00    Types: Cigarettes   Smokeless tobacco: Never  Vaping Use   Vaping Use: Never used  Substance Use Topics   Alcohol use: Yes    Comment: 1/2-5th pint of liquor   Drug use: Yes    Types: Cocaine, Marijuana    Comment: heroin     Home Medications Prior to Admission medications   Medication Sig Start Date End Date Taking? Authorizing  Provider  ARIPiprazole (ABILIFY) 15 MG tablet Take 1 tablet (15 mg total) by mouth daily. 01/28/21   Antonieta Pert, MD  divalproex (DEPAKOTE ER) 500 MG 24 hr tablet Take 2 tablets (1,000 mg total) by mouth at bedtime. 01/27/21   Antonieta Pert, MD  fluticasone (FLONASE) 50 MCG/ACT nasal spray Place 1 spray into both nostrils daily. Patient not taking: Reported on 01/23/2021 08/24/20 01/23/21  Petrucelli, Lelon Mast R, [name]     Allergies    Carrot [daucus carota] and Penicillins   Review of Systems   Review of Systems A comprehensive review of systems was completed and negative except as noted in HPI.    Physical Exam BP 118/70 (BP Location: Left Arm)   Pulse 82   Temp (!) 97.4 F (36.3 C) (Oral)   Resp 18   SpO2 100%   Physical Exam Vitals and nursing note reviewed.  HENT:     Head: Normocephalic.     Nose: Nose normal.  Eyes:     Extraocular Movements: Extraocular movements intact.  Pulmonary:     Effort: Pulmonary effort is normal.  Musculoskeletal:        General: Normal range of motion.  Cervical back: Neck supple.  Skin:    Findings: No rash (on exposed skin).  Neurological:     Mental Status: He is alert and oriented to person, place, and time.  Psychiatric:        Mood and Affect: Mood normal.        Behavior: Behavior normal.        Thought Content: Thought content normal.     ED Results / Procedures / Treatments   Labs (all labs ordered are listed, but only abnormal results are displayed) Labs Reviewed  COMPREHENSIVE METABOLIC PANEL - Abnormal; Notable for the following components:      Result Value   Glucose, Bld 127 (*)    All other components within normal limits  RAPID URINE DRUG SCREEN, HOSP PERFORMED - Abnormal; Notable for the following components:   Cocaine POSITIVE (*)    All other components within normal limits  RESP PANEL BY RT-PCR (FLU A&B, COVID) ARPGX2  CBC WITH DIFFERENTIAL/PLATELET  ETHANOL    EKG None  Radiology No  results found.  Procedures Procedures  Medications Ordered in the ED Medications - No data to display   MDM Rules/Calculators/A&P MDM  Patient is medically clear for TTS evaluation.   ED Course  I have reviewed the triage vital signs and the nursing notes.  Pertinent labs & imaging results that were available during my care of the patient were reviewed by me and considered in my medical decision making (see chart for details).  Clinical Course as of 04/01/21 1403  Fri Apr 01, 2021  1106 CBC, CMP and EtOH are normal.  [CS]  1302 UDS positive for Cocaine, Covid/Flu is neg.  [CS]    Clinical Course User Index [CS] Pollyann Savoy, MD    Final Clinical Impression(s) / ED Diagnoses Final diagnoses:  Cocaine abuse (HCC)  Suicidal ideation  Homelessness    Rx / DC Orders ED Discharge Orders     None        Pollyann Savoy, MD 04/01/21 [phone]

## 2021-04-01 NOTE — ED Notes (Signed)
Patient was given a [place] sandwich, cheese and crackers, and orange juice.

## 2021-04-01 NOTE — ED Notes (Signed)
Patient was given his dinner tray.

## 2021-04-01 NOTE — ED Notes (Signed)
Pt given cranberry juice.

## 2021-04-01 NOTE — BH Assessment (Signed)
Comprehensive Clinical Assessment (CCA) Note  04/01/2021 VIC ESCO [id number]  DISPOSITION: Gave clinical report to Cory Naegeli, NP who determined Pt meets criteria for inpatient psychiatric treatment. Pt is accepted to the service of Dr. Jola Babinski, room 404-1, pending COVID test and EKG. Notified Dr. Melene Plan and Annie Sable, RN of acceptance.  The patient demonstrates the following risk factors for suicide: Chronic risk factors for suicide include: psychiatric disorder of bipolar disorder, substance use disorder, previous suicide attempts by overdose, and chronic pain. Acute risk factors for suicide include: social withdrawal/isolation and homeless . Protective factors for this patient include: positive therapeutic relationship. Considering these factors, the overall suicide risk at this point appears to be high. Patient is not appropriate for outpatient follow up.  Flowsheet Row ED from 04/01/2021 in [facility] ED from 03/03/2021 in [facility]EM[facility]GENC[facility] DEPT Admission (Dis[facility]  C-SSRS RISK CATEGORY High Risk High Risk High Risk      Pt is a 45 year old single male who presents unaccompanied to [facility] ED reporting symptoms of depression, substance use, and suicidal ideation with plan. Pt says he has a gun and intends to shoot himself, insisting that if he is discharged he will kill himself. Pt says, "My addiction is out of control." He says that he is using cocaine and alcohol daily. He describes his mood as severely depressed. He says [facility] ACTT checked on him today and recommended he come to ED for mental health and substance abuse treatment. Pt acknowledges symptoms including crying spells, social withdrawal, loss of interest in usual pleasures, fatigue, irritability, decreased concentration, decreased sleep, decreased appetite and feelings of guilt,  worthlessness and hopelessness. He denies current homicidal ideation but says if he had contact with the ex-girlfriend who took his he might harm her. He states he has a history of hallucinations but denies current auditory or visual hallucinations. Pt denies use of substances other than cocaine and alcohol. Pt says he is experiencing "pain all over" and believes he is going through withdrawal from cocaine.  Pt identifies consequences of his substance use as his primary stressor. He says he believes he has been evicted from his residence. He says other than his ACTT team he has no other social supports. Pt reports he is currently on probation for failing to register as a sex offender.   Pt says he cannot remember the name of his psychiatrist through [facility]. He says he has not taken his psychiatric medications in two months because [facility] is sending the prescriptions to the wrong pharmacy. He reports he has been psychiatrically hospitalized several times in the past, most recently in April 2022 at [facility].   Pt is cover by a blanket, alert and oriented x4. Pt speaks in a clear tone, at moderate volume and normal pace. Motor behavior appears normal. Eye contact is good. Pt's mood is depressed and irritable, affect is congruent with mood. Thought process is coherent and relevant. There is no indication Pt is currently responding to internal stimuli or experiencing delusional thought content. Pt was cooperative throughout assessment. He says he is willing to sign voluntarily into a psychiatric facility.   Chief Complaint:  Chief Complaint  Patient presents with   Suicidal   Visit Diagnosis: F31.4 Bipolar I disorder, Current or most recent episode depressed, Severe F14.20 Cocaine use disorder, Severe F10.20 Alcohol use disorder, Moderate  CCA Screening, Triage  and Referral (STR)  Patient Reported Information How did you hear about [place]? Other (Comment) (EDP)  Referral name:  EDP  Referral phone number: 0 (N/A)   Whom do you see for routine medical problems? Other (Comment) (Urgent Care/ED)  Practice/Facility Name: No data recorded Practice/Facility Phone Number: No data recorded Name of Contact: No data recorded Contact Number: No data recorded Contact Fax Number: No data recorded Prescriber Name: No data recorded Prescriber Address (if known): No data recorded  What Is the Reason for Your Visit/Call Today? Pt states, "I quit an employer b/c I'm owed money and he didn't care if you stayed high. He set me up and I ended up being around people and getting high which I hadn't done in weeks. I silently called 911."  How Long Has This Been Causing You Problems? <Week  What Do You Feel Would Help You the Most Today? -- (Pt states, "You're just going to release me tomorrow, I know how this works.")   Have You Recently Been in Any Inpatient Treatment (Hospital/Detox/Crisis Center/28-Day Program)? No  Name/Location of Program/Hospital:No data recorded How Long Were You There? No data recorded When Were You Discharged? No data recorded  Have You Ever Received Services From [facility] Before? Yes  Who Do You See at [facility]? Various providers at the [facility], EDs, and Urgent Care   Have You Recently Had Any Thoughts About Hurting Yourself? Yes  Are You Planning to Commit Suicide/Harm Yourself At This time? -- (Pt states he might as well kill himself as he's already a dead man.)   Have you Recently Had Thoughts About Hurting Someone Karolee Ohs? Yes  Explanation: Pt would give no specifics, just that he has thoughts of harming someone and plans to follow-through with it.   Have You Used Any Alcohol or Drugs in the Past 24 Hours? -- (Pt continuously fell asleep throughout the assessment and was argumentative when woken up. No lab results are available at this time.)  How Long Ago Did You Use Drugs or Alcohol? No data recorded What Did You Use and How Much? No  data recorded  Do You Currently Have a Therapist/Psychiatrist? -- (Pt continuously fell asleep throughout the assessment and was argumentative when woken up. No lab results are available at this time.)  Name of Therapist/Psychiatrist: No data recorded  Have You Been Recently Discharged From Any Office Practice or Programs? -- (Pt continuously fell asleep throughout the assessment and was argumentative when woken up. No lab results are available at this time.)  Explanation of Discharge From Practice/Program: No data recorded    CCA Screening Triage Referral Assessment Type of Contact: Tele-Assessment  Is this Initial or Reassessment? Initial Assessment  Date Telepsych consult ordered in [facility]:  01/22/21  Time Telepsych consult ordered in [facility]:  0202   Patient Reported Information Reviewed? Yes  Patient Left Without Being Seen? No data recorded Reason for Not Completing Assessment: No data recorded  Collateral Involvement: Pt did not provide verbal consent for clinician to make contact with friends/damily members   Does Patient Have a Court Appointed Legal Guardian? No data recorded Name and Contact of Legal Guardian: NA  If Minor and Not Living with Parent(s), Who has Custody? N/A  Is CPS involved or ever been involved? -- (UTA)  Is APS involved or ever been involved? -- (UTA)   Patient Determined To Be At Risk for Harm To Self or Others Based on Review of Patient Reported Information or Presenting Complaint? -- (Both at  risk for harm to self and harm to others)  Method: No data recorded Availability of Means: No data recorded Intent: No data recorded Notification Required: No data recorded Additional Information for Danger to Others Potential: No data recorded Additional Comments for Danger to Others Potential: No data recorded Are There Guns or Other Weapons in Your Home? No data recorded Types of Guns/Weapons: No data recorded Are These Weapons Safely Secured?                             No data recorded Who Could Verify You Are Able To Have These Secured: No data recorded Do You Have any Outstanding Charges, Pending Court Dates, Parole/Probation? No data recorded Contacted To Inform of Risk of Harm To Self or Others: -- (Unable to make contact due to pt refusing to provide the name of the person he wants to kill.)   Location of Assessment: [facility] ED   Does Patient Present under Involuntary Commitment? No  IVC Papers Initial File Date: No data recorded  [place] of Residence: Cory Floyd   Patient Currently Receiving the Following Services: Not Receiving Services   Determination of Need: Urgent (48 hours)   Options For Referral: Medication Management; Outpatient Therapy     CCA Biopsychosocial Intake/Chief Complaint:  Per EDP note: "Patient is a 45 year old male with a history of polysubstance abuse, SI, bipolar disease and possible schizophrenia who takes no medications regularly at home and today is being brought in by police.  Patient was found to be at a boardinghouse where he used to live damaging the property.  When police arrived patient jumped out of the window falling approximately 10 to 12 feet.  He grabbed multiple things on the way down and landed softly on the ground.  He has been extremely paranoid since police picked him up.  Patient is reporting that they are going to kill him, he is a dead man anyway so he might as well just kill himself, he can see them coming for him and nobody can stop them and he would just like to leave now.  When asked directly if he is going to hurt himself he says no but then makes a comment that he might as well just kill himself because he is dead anyway.  He does admit to using using drugs in the last 24 hours but will not say what they were.  He denies any recent alcohol use.  He denies any injury from jumping out the window.  Based on prior records in the past patient has been on Abilify and Depakote but last time he  had those were over a year ago."  Current Symptoms/Problems: SI, HI   Patient Reported Schizophrenia/Schizoaffective Diagnosis in Past: No   Strengths: Pt states that he is motivated for treatment  Preferences: Not assessed.  Abilities: Not assessed.   Type of Services Patient Feels are Needed: UTA   Initial Clinical Notes/Concerns: N/A   Mental Health Symptoms Depression:   Change in energy/activity; Difficulty Concentrating; Fatigue; Hopelessness; Increase/decrease in appetite; Irritability; Sleep (too much or little); Tearfulness; Weight gain/loss; Worthlessness   Duration of Depressive symptoms:  Greater than two weeks   Mania:   Change in energy/activity; Irritability; Racing thoughts; Recklessness   Anxiety:    Difficulty concentrating; Fatigue; Irritability; Restlessness; Sleep; Tension; Worrying   Psychosis:   None   Duration of Psychotic symptoms:  Less than six months   Trauma:   None  Obsessions:   N/A   Compulsions:   N/A   Inattention:   N/A   Hyperactivity/Impulsivity:   N/A   Oppositional/Defiant Behaviors:   Easily annoyed; Angry   Emotional Irregularity:   Intense/inappropriate anger; Mood lability; Potentially harmful impulsivity; Recurrent suicidal behaviors/gestures/threats   Other Mood/Personality Symptoms:   None noted    Mental Status Exam Appearance and self-care  Stature:   Average   Weight:   Average weight   Clothing:   -- (Scrubs)   Grooming:   Normal   Cosmetic use:   None   Posture/gait:   Normal   Motor activity:   Not Remarkable   Sensorium  Attention:   Normal   Concentration:   Normal   Orientation:   X5   Recall/memory:   Normal   Affect and Mood  Affect:   Negative; Depressed   Mood:   Depressed; Irritable   Relating  Eye contact:   Normal   Facial expression:   Depressed   Attitude toward examiner:   Irritable; Dramatic   Thought and Language  Speech flow:   Clear and Coherent   Thought content:   Appropriate to Mood and Circumstances   Preoccupation:   None   Hallucinations:   None   Organization:  No data recorded  [employer] of Knowledge:   Fair   Intelligence:   Average   Abstraction:   Functional   Judgement:   Poor   Reality Testing:   Distorted   Insight:   Poor   Decision Making:   Impulsive   Social Functioning  Social Maturity:   Impulsive   Social Judgement:   Normal   Stress  Stressors:   Grief/losses; Housing; Financial   Coping Ability:   Deficient supports; Overwhelmed   Skill Deficits:   Interpersonal; Responsibility   Supports:   Support needed     Religion: Religion/Spirituality Are You A Religious Person?: No  Leisure/Recreation: Leisure / Recreation Do You Have Hobbies?: No  Exercise/Diet: Exercise/Diet Do You Exercise?: No Have You Gained or Lost A Significant Amount of Weight in the Past Six Months?: Yes-Lost Number of Pounds Lost?: 10 Do You Follow a Special Diet?: No Do You Have Any Trouble Sleeping?: Yes Explanation of Sleeping Difficulties: Pt reports chronic insomnia.   CCA Employment/Education Employment/Work Situation: Employment / Work Systems developer: On disability Why is Patient on Disability: Mental health How Long has Patient Been on Disability: unknown Patient's Job has Been Impacted by Current Illness: No Has Patient ever Been in the [employer]?: No  Education: Education Is Patient Currently Attending School?: No Did Theme park manager?: No Did You Have An Individualized Education Program (IIEP): No Did You Have Any Difficulty At School?: No Patient's Education Has Been Impacted by Current Illness: No   CCA Family/Childhood History Family and Relationship History: Family history Marital status: Single Does patient have children?: Yes How many children?: 2 How is patient's relationship with their children?:  Infrequent contact with daughters, ages 63 and 55.  Childhood History:  Childhood History Did patient suffer any verbal/emotional/physical/sexual abuse as a child?: No Did patient suffer from severe childhood neglect?: No Has patient ever been sexually abused/assaulted/raped as an adolescent or adult?: No Was the patient ever a victim of a crime or a disaster?: No Witnessed domestic violence?: No Has patient been affected by domestic violence as an adult?: No  Child/Adolescent Assessment:     CCA Substance Use Alcohol/Drug Use: Alcohol / Drug Use Pain  Medications: Please see MAR Prescriptions: Please see MAR Over the Counter: Please see MAR History of alcohol / drug use?: Yes Longest period of sobriety (when/how long): Pt unable to estimate 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, Work / Programmer, multimediachool, Armed forces operational officerLegal Substance #1 Name of Substance 1: Cocaine 1 - Age of First Use: 21 1 - Amount (size/oz): Approximately 2 grams 1 - Frequency: Daily 1 - Duration: Ongoing 1 - Last Use / Amount: 04/01/2021 1 - Method of Aquiring: Unknown 1- Route of Use: Smoking Substance #2 Name of Substance 2: Alcohol 2 - Age of First Use: Adolescent 2 - Amount (size/oz): 1-2 beers 2 - Frequency: Daily 2 - Duration: Ongoing 2 - Last Use / Amount: 04/01/2021, 1 beer 2 - Method of Aquiring: Store 2 - Route of Substance Use: Oral                     ASAM's:  Six Dimensions of Multidimensional Assessment  Dimension 1:  Acute Intoxication and/or Withdrawal Potential:   Dimension 1:  Description of individual's past and current experiences of substance use and withdrawal: Pt reports he used to drink heavily but now he uses crack  Dimension 2:  Biomedical Conditions and Complications:   Dimension 2:  Description of patient's biomedical conditions and  complications: Diabetes, arthritis  Dimension 3:  Emotional, Behavioral, or Cognitive Conditions and Complications:  Dimension 3:  Description of  emotional, behavioral, or cognitive conditions and complications: History of bipolar disorder  Dimension 4:  Readiness to Change:  Dimension 4:  Description of Readiness to Change criteria: Pt is contemplation stage  Dimension 5:  Relapse, Continued use, or Continued Problem Potential:  Dimension 5:  Relapse, continued use, or continued problem potential critiera description: Pt has short episodes of sobriety  Dimension 6:  Recovery/Living Environment:  Dimension 6:  Recovery/Iiving environment criteria description: Pt is homeless  ASAM Severity Score: ASAM's Severity Rating Score: 14  ASAM Recommended Level of Treatment: ASAM Recommended Level of Treatment: Level III Residential Treatment   Substance use Disorder (SUD) Substance Use Disorder (SUD)  Checklist Symptoms of Substance Use: Continued use despite having a persistent/recurrent physical/psychological problem caused/exacerbated by use, Continued use despite persistent or recurrent social, interpersonal problems, caused or exacerbated by use, Persistent desire or unsuccessful efforts to cut down or control use, Large amounts of time spent to obtain, use or recover from the substance(s), Presence of craving or strong urge to use, Recurrent use that results in a failure to fulfill major role obligations (work, school, home), Substance(s) often taken in larger amounts or over longer times than was intended  Recommendations for Services/Supports/Treatments: Recommendations for Services/Supports/Treatments Recommendations For Services/Supports/Treatments: Residential-Level 3  DSM5 Diagnoses: Patient Active Problem List   Diagnosis Date Noted   Bipolar disorder (HCC) 01/24/2021   Substance induced mood disorder (HCC) 07/26/2020   Suicidal ideation    Polysubstance dependence (HCC) 07/25/2020   Bipolar affective disorder, depressed, moderate (HCC) 03/06/2020   Arthritis 09/30/2019   Gastroesophageal reflux disease without esophagitis  09/21/2016   Obstructive sleep apnea 09/21/2016   Morbid obesity with BMI of 40.0-44.9, adult (HCC) 08/15/2016   Stable angina pectoris (HCC) 08/15/2016    Patient Centered Plan: Patient is on the following Treatment Plan(s):  Anxiety, Depression, and Substance Abuse   Referrals to Alternative Service(s): Referred to Alternative Service(s):   Place:   Date:   Time:    Referred to Alternative Service(s):   Place:   Date:   Time:    Referred  to Alternative Service(s):   Place:   Date:   Time:    Referred to Alternative Service(s):   Place:   Date:   Time:     Pamalee Leyden, [facility]

## 2021-04-01 NOTE — ED Notes (Signed)
Pt given lunch tray.

## 2021-04-01 NOTE — ED Notes (Signed)
Patient was given his lunch tray.

## 2021-04-01 NOTE — ED Notes (Signed)
Pt moved to room 8 to complete TTS assessment.

## 2021-04-01 NOTE — ED Triage Notes (Signed)
Pt arrives with complaints of SI with a plan. Pt states he would shoot himself with his gun.

## 2021-04-01 NOTE — ED Notes (Signed)
Patient was given an extra dinner tray.

## 2021-04-01 NOTE — Progress Notes (Signed)
Patient ID: Cory Floyd, male   DOB: November 15, 1975, 45 y.o.   MRN: [id number] Patient has been accepted to [facility] 404-1. Please call report to adult unit at [phone].

## 2021-04-02 ENCOUNTER — Encounter (HOSPITAL_COMMUNITY): Payer: Self-pay | Admitting: Nurse Practitioner

## 2021-04-02 ENCOUNTER — Inpatient Hospital Stay (HOSPITAL_COMMUNITY)
Admission: AD | Admit: 2021-04-02 | Discharge: 2021-04-06 | DRG: 897 | Disposition: A | Discharge status: Home / Self Care | Payer: Medicare (Managed Care) | Source: Intra-hospital | Attending: Psychiatry | Admitting: Psychiatry

## 2021-04-02 DIAGNOSIS — F419 Anxiety disorder, unspecified: Secondary | ICD-10-CM | POA: Diagnosis present

## 2021-04-02 DIAGNOSIS — F17210 Nicotine dependence, cigarettes, uncomplicated: Secondary | ICD-10-CM | POA: Diagnosis present

## 2021-04-02 DIAGNOSIS — F314 Bipolar disorder, current episode depressed, severe, without psychotic features: Secondary | ICD-10-CM | POA: Diagnosis present

## 2021-04-02 DIAGNOSIS — R45851 Suicidal ideations: Secondary | ICD-10-CM | POA: Diagnosis present

## 2021-04-02 DIAGNOSIS — G4700 Insomnia, unspecified: Secondary | ICD-10-CM | POA: Diagnosis present

## 2021-04-02 DIAGNOSIS — F1414 Cocaine abuse with cocaine-induced mood disorder: Principal | ICD-10-CM | POA: Diagnosis present

## 2021-04-02 LAB — LIPID PANEL
Cholesterol: 168 mg/dL (ref 0–200)
HDL: 61 mg/dL (ref >40)
LDL Cholesterol: 81 mg/dL (ref 0–99)
Total CHOL/HDL Ratio: 2.8 RATIO
Triglycerides: 131 mg/dL (ref <150)
VLDL: 26 mg/dL (ref 0–40)

## 2021-04-02 LAB — VALPROIC ACID LEVEL: Valproic Acid Lvl: <10 ug/mL — ABNORMAL LOW (ref 50.0–100.0)

## 2021-04-02 LAB — MAGNESIUM: Magnesium: 2 mg/dL (ref 1.7–2.4)

## 2021-04-02 LAB — GLUCOSE, CAPILLARY: Glucose-Capillary: 190 mg/dL — ABNORMAL HIGH (ref 70–99)

## 2021-04-02 LAB — TSH: TSH: 1.524 u[IU]/mL (ref 0.350–4.500)

## 2021-04-02 MED ORDER — ALUM & MAG HYDROXIDE-SIMETH 200-200-20 MG/5ML PO SUSP: 30.0000 mL | ORAL | Status: DC | PRN

## 2021-04-02 MED ORDER — MAGNESIUM HYDROXIDE 400 MG/5ML PO SUSP: 30.0000 mL | Freq: Every day | ORAL | Status: DC | PRN

## 2021-04-02 MED ORDER — ACETAMINOPHEN 325 MG PO TABS
650.0000 mg | ORAL_TABLET | Freq: Four times a day (QID) | ORAL | Status: DC | PRN
  Administered 2021-04-02 – 2021-04-04 (×4): 650 mg via ORAL
  Filled 2021-04-02 (×4): qty 2

## 2021-04-02 MED ORDER — ARIPIPRAZOLE 15 MG PO TABS
15.0000 mg | ORAL_TABLET | Freq: Every day | ORAL | Status: DC
  Filled 2021-04-02: qty 1

## 2021-04-02 MED ORDER — TRAZODONE HCL 50 MG PO TABS
50.0000 mg | ORAL_TABLET | Freq: Once | ORAL | Status: DC
  Filled 2021-04-02: qty 1

## 2021-04-02 MED ORDER — DIVALPROEX SODIUM ER 500 MG PO TB24
1000.0000 mg | ORAL_TABLET | Freq: Every day | ORAL | Status: DC
  Administered 2021-04-02: 1000 mg via ORAL
  Filled 2021-04-02 (×4): qty 2

## 2021-04-02 MED ORDER — TRAZODONE HCL 50 MG PO TABS
50.0000 mg | ORAL_TABLET | Freq: Every evening | ORAL | Status: DC | PRN
  Administered 2021-04-02: 50 mg via ORAL
  Filled 2021-04-02: qty 1

## 2021-04-02 MED ORDER — MIRTAZAPINE 15 MG PO TABS
15.0000 mg | ORAL_TABLET | Freq: Every day | ORAL | Status: DC
Start: 2021-04-02 — End: 2021-04-06
  Administered 2021-04-02 – 2021-04-05 (×4): 15 mg via ORAL
  Filled 2021-04-02 (×6): qty 1

## 2021-04-02 MED ORDER — HYDROXYZINE HCL 25 MG PO TABS
25.0000 mg | ORAL_TABLET | Freq: Three times a day (TID) | ORAL | Status: DC | PRN
  Administered 2021-04-02 – 2021-04-05 (×4): 25 mg via ORAL
  Filled 2021-04-02 (×4): qty 1

## 2021-04-02 MED ORDER — ARIPIPRAZOLE 10 MG PO TABS
10.0000 mg | ORAL_TABLET | Freq: Every day | ORAL | Status: DC
  Administered 2021-04-04 – 2021-04-05 (×2): 10 mg via ORAL
  Filled 2021-04-02 (×5): qty 1

## 2021-04-02 NOTE — Tx Team (Signed)
Initial Treatment Plan 04/02/2021 3:53 AM [name] FMB:[id number]    PATIENT STRESSORS: Substance abuse   PATIENT STRENGTHS: Ability for insight Communication skills Motivation for treatment/growth   PATIENT IDENTIFIED PROBLEMS: Suicide Ideation with a plan  "My addiction is out of control"                   DISCHARGE CRITERIA:  Ability to meet basic life and health needs Improved stabilization in mood, thinking, and/or behavior Motivation to continue treatment in a less acute level of care Need for constant or close observation no longer present Verbal commitment to aftercare and medication compliance  PRELIMINARY DISCHARGE PLAN: Outpatient therapy Return to previous living arrangement  PATIENT/FAMILY INVOLVEMENT: This treatment plan has been presented to and reviewed with the patient, [name], and/or family member.  The patient and family have been given the opportunity to ask questions and make suggestions.  Cory Rana, RN 04/02/2021, 3:53 AM

## 2021-04-02 NOTE — ED Notes (Signed)
Spoke with safe transport, dispatch in process.

## 2021-04-02 NOTE — BHH Group Notes (Signed)
LCSW Group Therapy Note  04/02/2021   10:00-11:00am   Type of Therapy and Topic:  Group Therapy: Anger Cues and Responses  Participation Level:  Did Not Attend   Description of Group:   In this group, patients learned how to recognize the physical, cognitive, emotional, and behavioral responses they have to anger-provoking situations.  They identified a recent time they became angry and how they reacted.  They analyzed how their reaction was possibly beneficial and how it was possibly unhelpful.  The group discussed a variety of healthier coping skills that could help with such a situation in the future.  Focus was placed on how helpful it is to recognize the underlying emotions to our anger, because working on those can lead to a more permanent solution as well as our ability to focus on the important rather than the urgent.  Therapeutic Goals: Patients will remember their last incident of anger and how they felt emotionally and physically, what their thoughts were at the time, and how they behaved. Patients will identify how their behavior at that time worked for them, as well as how it worked against them. Patients will explore possible new behaviors to use in future anger situations. Patients will learn that anger itself is normal and cannot be eliminated, and that healthier reactions can assist with resolving conflict rather than worsening situations.  Summary of Patient Progress:  The patient was invited to group, did not attend.  Therapeutic Modalities:   Cognitive Behavioral Therapy  Kodi Guerrera J Grossman-Orr   

## 2021-04-02 NOTE — Progress Notes (Signed)
Adult Psychoeducational Group Note  Date:  04/02/2021 Time:  10:52 AM  Group Topic/Focus:  Goals Group:   The focus of this group is to help patients establish daily goals to achieve during treatment and discuss how the patient can incorporate goal setting into their daily lives to aide in recovery.  Participation Level:  Did Not Attend [facility] 04/02/2021, 10:52 AM

## 2021-04-02 NOTE — Progress Notes (Signed)
Pt endorsing SI and said, "If I weren't in the hospital, I'd use my gun to kill me."  Pt was somewhat irritable in the morning, but has responded well to encouragement and redirection.  Pt observed playing cards with other patients and pt went outside with the group for recreation time.  Pt says his goal is to "get of the drugs".  Pt remains safe on the unit with 15 min room checks in place.
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
## 2021-04-02 NOTE — Progress Notes (Addendum)
  Initial Admission Note:  Pt presents to [facility] at approximately 0216 with diagnosis of  SI, Cocaine Abuse, and Homelessness.  Pt currently states his plan was to visit his deceased mother's grave, take a lot of pills, then shoot himself.  Patient reports he has access to a gun.  Patient reports SI, AVH.  Pt states, "voices are telling him he is not suppose to be here anymore."  Pt verbally contracts for safety, and agrees to contact RN or other staff member prior to acting on any thoughts of suicide.  Pt admits to past self-harm with old scars on R&L arm.  Pt complaints are numerous to include agitation, anger, anxiety appetite decrease, inability to concentrate, confusion, depression, disoriented, hopeless, irritability, insomnia, panic attacks, self-harm, and worrying.  Pt is alert and oriented.  Pt reports inability to see stating, "he needs glasses."  Skin assessment warm, dry, multiple tatoos (torso and R&L arm).  No contraband found.  Pt vitals are normal.  Pt reports a fall two weeks ago and has been placed on "High fall Risk".  Pt oriented to unit, provided food and beverage.  Q15 minute safety checks initiated.  Pt safe on unit.
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
## 2021-04-03 MED ORDER — DIVALPROEX SODIUM 500 MG PO DR TAB
500.0000 mg | DELAYED_RELEASE_TABLET | Freq: Two times a day (BID) | ORAL | Status: DC
  Filled 2021-04-03 (×2): qty 1

## 2021-04-03 MED ORDER — DIVALPROEX SODIUM ER 500 MG PO TB24
1000.0000 mg | ORAL_TABLET | Freq: Every day | ORAL | Status: DC
  Administered 2021-04-03 – 2021-04-05 (×3): 1000 mg via ORAL
  Filled 2021-04-03 (×4): qty 2

## 2021-04-03 MED ORDER — TRAZODONE HCL 100 MG PO TABS
100.0000 mg | ORAL_TABLET | Freq: Every evening | ORAL | Status: DC | PRN
  Administered 2021-04-03 – 2021-04-05 (×3): 100 mg via ORAL
  Filled 2021-04-03 (×3): qty 1

## 2021-04-03 NOTE — Plan of Care (Signed)
?  Problem: Coping: ?Goal: Coping ability will improve ?Outcome: Progressing  ?Problem: Health Behavior/Discharge Planning: ?Goal: Compliance with therapeutic regimen will improve ?Outcome: Progressing ?  ?

## 2021-04-03 NOTE — Progress Notes (Signed)
D- Patient alert and oriented. Patient affect/mood reported as calm. Patient refused morning medications as scheduled. Denies SI, HI, AVH, and pain. Patient Goal. None reported  A- Scheduled medications administered to patient, per MD orders. Support and encouragement provided.  Routine safety checks conducted every 15 minutes.  Patient informed to notify staff with problems or concerns.  R- No adverse drug reactions noted. Patient contracts for safety at this time. Patient non compliant with medications this morning and treatment plan. Patient receptive, calm, and cooperative. Patient interacts well with others on the unit.  Patient remains safe at this time.             [place] NOVEL CORONAVIRUS (COVID-19) DAILY CHECK-OFF SYMPTOMS - answer yes or no to each - every day NO YES  Have you had a fever in the past 24 hours?  Fever (Temp > 37.80C / 100F) X    Have you had any of these symptoms in the past 24 hours? New Cough  Sore Throat   Shortness of Breath  Difficulty Breathing  Unexplained Body Aches   X    Have you had any one of these symptoms in the past 24 hours not related to allergies?   Runny Nose  Nasal Congestion  Sneezing   X    If you have had runny nose, nasal congestion, sneezing in the past 24 hours, has it worsened?   X    EXPOSURES - check yes or no X    Have you traveled outside the state in the past 14 days?   X    Have you been in contact with someone with a confirmed diagnosis of COVID-19 or PUI in the past 14 days without wearing appropriate PPE?   X    Have you been living in the same home as a person with confirmed diagnosis of COVID-19 or a PUI (household contact)?     X    Have you been diagnosed with COVID-19?     X                                                                                                                             What to do next: Answered NO to all: Answered YES to anything:    Proceed with unit schedule Follow the [facility]  Inpatient Flowsheet.

## 2021-04-03 NOTE — Progress Notes (Signed)
[facility] MD Progress Note  04/03/2021 5:08 PM Cory Floyd  MRN:  [id number] Subjective:  "I am still suicidal, if I leave without a plan in place I won't last 24 hours"   Objective: Cory Floyd is a 45 y.o. male with history of psychiatric and substance abuse problems for years. States he has been depressed and suicidal with plan to use a gun. He states he has access to a gun at his house, but also states he is homeless so it isn't clear what his living situation is. He has had several previous ED visits for same. States his ACT team worker came to check on him today and brought him in for evaluation.  Evaluation on the unit today: Patient was seen, chart reviewed and case discussed with the treatment team. Patient is lying in bed in his room. He makes poor eye contact. He stated "I am still suicidal and need to go someplace long term from here." He is homeless. He is isolating to his room, not attending group activities and stated my whole body hurts today. He stated he did got to the cafeteria for breakfast and lunch. He reported good sleep and a good appetite. He slept 5.5 hours last night. He did not take his Abilify this morning. When asked why, he stated "nobody came and told me to take it." Reminded patient he is to get up out of bed to take his medication. He did take his Depakote last night. He denies HI/AVH, paranoia and delusions. His Valproic Acid level was <10 today. Lipid panel is within normal limits. His TSH is normal at 1.524. No new labs today.   Principal Problem: Bipolar 1 disorder, depressed, severe (HCC) Diagnosis: Principal Problem:   Bipolar 1 disorder, depressed, severe (HCC) Active Problems:   Cocaine abuse with cocaine-induced mood disorder (HCC)  Total Time spent with patient:  15 minutes  Past Psychiatric History: See H&P  Past Medical History:  Past Medical History:  Diagnosis Date   Alcoholic (HCC)    clean for 3 years   Anginal pain (HCC)    Arthritis     Bipolar 1 disorder (HCC)    COVID-19    Depressed    Diabetes mellitus without complication (HCC)    Dyspnea    Fatty liver    Hypertension    Schizophrenia (HCC)     Past Surgical History:  Procedure Laterality Date   COLONOSCOPY  02/05/2014   diverticulosis, hyperplastic polyp x 2   fracture arm Right    KNEE SURGERY Right 2013   NASAL SEPTUM SURGERY     NASAL TURBINATE REDUCTION Bilateral 12/12/2016   Procedure: TURBINATE REDUCTION/SUBMUCOSAL RESECTION;  Surgeon: Linus Salmons, MD;  Location: [facility] ORS;  Service: ENT;  Laterality: Bilateral;   Family History:  Family History  Problem Relation Age of Onset   Hepatitis C Mother    Cancer Mother    Cancer Maternal Aunt    Hypertension Maternal Grandmother    Family Psychiatric  History: See H&P Social History:  Social History   Substance and Sexual Activity  Alcohol Use Yes   Comment: 1/2-5th pint of liquor     Social History   Substance and Sexual Activity  Drug Use Yes   Types: Cocaine, Marijuana   Comment: heroin    Social History   Socioeconomic History   Marital status: Divorced    Spouse name: Not on file   Number of children: Not on file   Years of education: Not  on file   Highest education level: Not on file  Occupational History   Not on file  Tobacco Use   Smoking status: Every Day    Packs/day: 1.00    Years: 20.00    Pack years: 20.00    Types: Cigarettes   Smokeless tobacco: Never  Vaping Use   Vaping Use: Never used  Substance and Sexual Activity   Alcohol use: Yes    Comment: 1/2-5th pint of liquor   Drug use: Yes    Types: Cocaine, Marijuana    Comment: heroin   Sexual activity: Yes    Birth control/protection: None  Other Topics Concern   Not on file  Social History Narrative   Not on file   Social Determinants of Health   Financial Resource Strain: Not on file  Food Insecurity: Not on file  Transportation Needs: Not on file  Physical Activity: Not on file  Stress: Not  on file  Social Connections: Not on file   Additional Social History:    Sleep: Good  Appetite:  Good  Current Medications: Current Facility-Administered Medications  Medication Dose Route Frequency Provider Last Rate Last Admin   acetaminophen (TYLENOL) tablet 650 mg  650 mg Oral Q6H PRN Bobbitt, Shalon E, NP   650 mg at 04/02/21 2045   alum & mag hydroxide-simeth (MAALOX/MYLANTA) 200-200-20 MG/5ML suspension 30 mL  30 mL Oral Q4H PRN Bobbitt, Shalon E, NP       ARIPiprazole (ABILIFY) tablet 10 mg  10 mg Oral Daily Laveda Abbe, NP       divalproex (DEPAKOTE ER) 24 hr tablet 1,000 mg  1,000 mg Oral QHS Laveda Abbe, NP   1,000 mg at 04/02/21 2044   hydrOXYzine (ATARAX/VISTARIL) tablet 25 mg  25 mg Oral TID PRN Laveda Abbe, NP   25 mg at 04/02/21 2044   magnesium hydroxide (MILK OF MAGNESIA) suspension 30 mL  30 mL Oral Daily PRN Bobbitt, Shalon E, NP       mirtazapine (REMERON) tablet 15 mg  15 mg Oral QHS Laveda Abbe, NP   15 mg at 04/02/21 2103   traZODone (DESYREL) tablet 100 mg  100 mg Oral QHS PRN Bobbitt, Shalon E, NP        Lab Results:  Results for orders placed or performed during the hospital encounter of 04/02/21 (from the past 48 hour(s))  Glucose, capillary     Status: Abnormal   Collection Time: 04/02/21  2:37 AM  Result Value Ref Range   Glucose-Capillary 190 (H) 70 - 99 mg/dL    Comment: Glucose reference range applies only to samples taken after fasting for at least 8 hours.   Comment 1 Notify RN    Comment 2 Document in Chart   TSH     Status: None   Collection Time: 04/02/21  6:00 PM  Result Value Ref Range   TSH 1.524 0.350 - 4.500 uIU/mL    Comment: Performed by a 3rd Generation assay with a functional sensitivity of <=0.01 uIU/mL. Performed at [facility], [address] [place], [place] [address]   Lipid panel     Status: None   Collection Time: 04/02/21  6:00 PM  Result Value Ref Range    Cholesterol 168 0 - 200 mg/dL   Triglycerides 621 <308 mg/dL   HDL 61 >65 mg/dL   Total CHOL/HDL Ratio 2.8 RATIO   VLDL 26 0 - 40 mg/dL   LDL Cholesterol 81 0 -  99 mg/dL    Comment:        Total Cholesterol/HDL:CHD Risk Coronary Heart Disease Risk Table                     Men   Women  1/2 Average Risk   3.4   3.3  Average Risk       5.0   4.4  2 X Average Risk   9.6   7.1  3 X Average Risk  23.4   11.0        Use the calculated Patient Ratio above and the CHD Risk Table to determine the patient's CHD Risk.        ATP III CLASSIFICATION (LDL):  <100     mg/dL   Optimal  409-811  mg/dL   Near or Above                    Optimal  130-159  mg/dL   Borderline  914-782  mg/dL   High  >956     mg/dL   Very High Performed at [facility], [address] [place], [place] [address]   Magnesium     Status: None   Collection Time: 04/02/21  6:00 PM  Result Value Ref Range   Magnesium 2.0 1.7 - 2.4 mg/dL    Comment: Performed at [facility], [address], [place], [place] [address]  Valproic acid level     Status: Abnormal   Collection Time: 04/02/21  6:00 PM  Result Value Ref Range   Valproic Acid Lvl <10 (L) 50.0 - 100.0 ug/mL    Comment: RESULTS CONFIRMED BY MANUAL DILUTION RESULT REPEATED AND VERIFIED Performed at [facility], [address] [place], [place] [address]     Blood Alcohol level:  Lab Results  Component Value Date   ETH <10 04/01/2021   ETH <10 03/04/2021    Metabolic Disorder Labs: Lab Results  Component Value Date   HGBA1C 5.8 (H) 01/23/2021   MPG 119.76 01/23/2021   MPG 119.76 10/26/2019   No results found for: PROLACTIN Lab Results  Component Value Date   CHOL 168 04/02/2021   TRIG 131 04/02/2021   HDL 61 04/02/2021   CHOLHDL 2.8 04/02/2021   VLDL 26 04/02/2021   LDLCALC 81 04/02/2021   LDLCALC 105 (H) 01/23/2021    Physical Findings: AIMS:  , ,  ,  ,    CIWA:    COWS:      Musculoskeletal: Strength & Muscle Tone: within normal limits Gait & Station: normal Patient leans: N/A  Psychiatric Specialty Exam:  Presentation  General Appearance: Disheveled  Eye Contact:Fair  Speech:Normal Rate  Speech Volume:Decreased  Handedness:Right  Mood and Affect  Mood:Euthymic  Affect:Congruent  Thought Process  Thought Processes:Coherent  Descriptions of Associations:Circumstantial  Orientation:Full (Time, Place and Person)  Thought Content:Logical  History of Schizophrenia/Schizoaffective disorder:No  Duration of Psychotic Symptoms:Less than six months  Hallucinations:No data recorded Ideas of Reference:None  Suicidal Thoughts:No data recorded Homicidal Thoughts:No data recorded  Sensorium  Memory:Immediate Fair; Recent Fair; Remote Fair  Judgment:Poor  Insight:Lacking  Executive Functions  Concentration:Good  Attention Span:Good  Recall:Fair  Fund of Knowledge:Fair  Language:Good  Psychomotor Activity  Psychomotor Activity: No data recorded  Assets  Assets:Desire for Improvement; Resilience  Sleep  Sleep: No data recorded  Physical Exam: Physical Exam Vitals and nursing note reviewed.  Constitutional:      Appearance: Normal appearance.  HENT:     Head: Normocephalic.  Pulmonary:     Effort: Pulmonary effort is normal.  Musculoskeletal:        General: Normal range of motion.     Cervical back: Normal range of motion.  Neurological:     Mental Status: He is alert and oriented to person, place, and time.  Psychiatric:        Attention and Perception: Attention normal. He does not perceive auditory or visual hallucinations.        Mood and Affect: Mood is anxious and depressed.        Speech: Speech normal.        Behavior: Behavior is withdrawn.        Thought Content: Thought content is not paranoid or delusional. Thought content includes suicidal ideation. Thought content does not include homicidal  ideation. Thought content does not include homicidal or suicidal plan.   Review of Systems  Constitutional:  Negative for fever.  HENT:  Negative for congestion, sinus pain and sore throat.   Respiratory:  Negative for cough and shortness of breath.   Gastrointestinal: Negative.   Genitourinary: Negative.   Musculoskeletal: Negative.   Neurological: Negative.   Psychiatric/Behavioral:  Positive for depression, substance abuse and suicidal ideas.   Blood pressure 119/76, pulse 85, resp. rate 16. There is no height or weight on file to calculate BMI.   Treatment Plan Summary: Daily contact with patient to assess and evaluate symptoms and progress in treatment and Medication management  Bipolar Depression: -Continue Remeron 15 mg PO at bedtime -Continue Abilify 10 mg PO daily  Mood stabilization:  -Continue Depakote ER 1,000 mg at bedtime Valproic Acid level today <10  Insomnia: -Continue Trazodone 50 mg PO at bedtime PRN  Anxiety: -Continue Vistaril 25 mg PO TID PRN  Continue with every 15 minute safety checks Encourage participation in the therapeutic milieu Discharge planning in progress - patient wants referral to residential substance abuse treatment programs. EOS 3-5 days    Laveda AbbeLaurie Britton Thessaly Mccullers, NP 04/03/2021, 5:29 PM

## 2021-04-03 NOTE — Progress Notes (Signed)
Adult Psychoeducational Group Note  Date:  04/03/2021 Time:  12:26 AM  Group Topic/Focus:  Wrap-Up Group:   The focus of this group is to help patients review their daily goal of treatment and discuss progress on daily workbooks.  Participation Level:  Active  Participation Quality:  Appropriate  Affect:  Appropriate  Cognitive:  Alert and Appropriate  Insight: Appropriate  Engagement in Group:  Supportive  Modes of Intervention:  Discussion  Additional Comments: Pt articulated that he did not have a goal for today but indicated that he is looking forward to being discharged. Pt stated he did talk with staff and a medical doctor about his care. Pt conveyed he did not make a phone call today and reported relationship with his family and support system was the same. Pt reported he took all medications provided and attended all meal services with 100% intake. Pt said his appetite was good today. Pt evaluated his sleep last night as good. Pt verbalized he felt good about himself and rated his overall day a 9 out of 10 on this date. Pt articulated he had no physical pain. Pt denies no auditory or visual hallucinations or thoughts of harming himself or others. Pt said he would alert staff if anything changed. End of Wrap-Up Group progress report.   Nicoletta Dress 04/03/2021, 12:26 AM

## 2021-04-03 NOTE — [facility] Group Notes (Signed)
The focus of this group is to help patients establish daily goals to achieve during treatment and discuss how the patient can incorporate goal setting into their daily lives to aide in recovery.  Pt did not attend group 

## 2021-04-03 NOTE — Progress Notes (Signed)
   04/03/21 2237  Psych Admission Type (Psych Patients Only)  Admission Status Voluntary  Psychosocial Assessment  Patient Complaints Anxiety;Substance abuse  Eye Contact Fair  Facial Expression Anxious  Affect Appropriate to circumstance  Speech Logical/coherent  Interaction Assertive  Motor Activity Other (Comment) (WDL)  Appearance/Hygiene Unremarkable  Behavior Characteristics Appropriate to situation;Irritable  Mood Pleasant;Labile  Thought Process  Coherency WDL  Content WDL  Delusions None reported or observed  Perception WDL  Hallucination None reported or observed  Judgment Poor  Confusion None  Danger to Self  Current suicidal ideation? Denies  Danger to Others  Danger to Others None reported or observed
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
## 2021-04-03 NOTE — Progress Notes (Signed)
   04/03/21 0034  Psych Admission Type (Psych Patients Only)  Admission Status Voluntary  Psychosocial Assessment  Patient Complaints None  Eye Contact Fair  Facial Expression Animated  Affect Appropriate to circumstance  Speech Logical/coherent  Interaction Assertive  Motor Activity Other (Comment) (WDL)  Appearance/Hygiene Unremarkable  Behavior Characteristics Appropriate to situation  Mood Labile;Pleasant  Thought Process  Coherency WDL  Content WDL  Delusions None reported or observed  Perception WDL  Hallucination None reported or observed  Judgment Poor  Confusion None  Danger to Self  Current suicidal ideation? Denies  Danger to Others  Danger to Others None reported or observed

## 2021-04-03 NOTE — [facility] Group Notes (Signed)
[facility] LCSW Group Therapy Note  04/03/2021    Type of Therapy and Topic:  Group Therapy:  A Hero Worthy of Support  Participation Level:  Minimal   Description of Group:  Patients in this group were introduced to the concept that additional supports including self-support are an essential part of recovery.  Matching needs with supports to help fulfill those needs was explained.  Establishing boundaries that can gradually be increased or decreased was described, with patients giving their own examples of establishing appropriate boundaries in their lives.  A song entitled "My Own Hero" was played and a group discussion ensued in which patients stated it inspired them to help themselves in order to succeed, because other people cannot achieve their goals such as sobriety or stability for them.  A song was played called "I Am Enough" which led to a discussion about being willing to believe we are worth the effort of being a self-support.   Therapeutic Goals: 1)  demonstrate the importance of being a key part of one's own support system 2)  discuss various available supports 3)  encourage patient to use music as part of their self-support and focus on goals 4)  elicit ideas from patients about supports that need to be added   Summary of Patient Progress:  The patient expressed that he needs any support at all at discharge, because he has none.  When another patient mentioned that they need a group home, he agreed with this and said that is what he needs, because it has structure.  He left group early, did not return.  Therapeutic Modalities:   Motivational Interviewing Activity  Lynnell Chad

## 2021-04-04 DIAGNOSIS — F314 Bipolar disorder, current episode depressed, severe, without psychotic features: Secondary | ICD-10-CM

## 2021-04-04 LAB — HEMOGLOBIN A1C
Hgb A1c MFr Bld: 5.5 % (ref 4.8–5.6)
Mean Plasma Glucose: 111 mg/dL

## 2021-04-04 NOTE — [facility] Group Notes (Signed)
Occupational Therapy Group Note Date: 04/04/2021 Group Topic/Focus: Strengths Exploration  Group Description: Group encouraged increased participation and engagement through discussion focused on STRENGTHS. Patients were encouraged to fill out a worksheet to structure discussion, that included identifying strengths as it relates to one's relationships, profession, and personal fulfillment. Discussion followed with patients sharing their responses and highlighting their own personal strengths.  Therapeutic Goals: Identify strengths vs weaknesses Discuss and identify ways we can highlight our strengths  Participation Level: Cory Floyd was present for introductions and shared that strength to him is "having the will". Pt left shortly after sharing and did not return. Present for ~5 minutes.    Plan: Continue to engage patient in OT groups 2 - 3x/week.  04/04/2021  Donne Hazel, MOT, OTR/L

## 2021-04-04 NOTE — Progress Notes (Signed)
Psychoeducational Group Note  Date:  04/04/2021 Time:  2050  Group Topic/Focus:  Wrap-Up Group:   The focus of this group is to help patients review their daily goal of treatment and discuss progress on daily workbooks.  Participation Level: Did Not Attend  Participation Quality:  Not Applicable  Affect:  Not Applicable  Cognitive:  Not Applicable  Insight:  Not Applicable  Engagement in Group: Not Applicable  Additional Comments:  The patient did not attend group this evening.   Hazle Coca S 04/04/2021, 8:50 PM

## 2021-04-04 NOTE — Progress Notes (Signed)
   04/04/21 0800  Psych Admission Type (Psych Patients Only)  Admission Status Voluntary  Psychosocial Assessment  Patient Complaints Anxiety  Eye Contact Fair  Facial Expression Anxious  Affect Anxious  Speech Logical/coherent  Interaction Assertive  Motor Activity Other (Comment) (WDL)  Appearance/Hygiene Unremarkable  Behavior Characteristics Irritable;Cooperative;Anxious  Mood Depressed;Anxious  Thought Process  Coherency WDL  Content WDL  Delusions None reported or observed  Perception WDL  Hallucination None reported or observed  Judgment Poor  Confusion None  Danger to Self  Current suicidal ideation? Denies  Danger to Others  Danger to Others None reported or observed  [place] NOVEL CORONAVIRUS (COVID-19) DAILY CHECK-OFF SYMPTOMS - answer yes or no to each - every day NO YES  Have you had a fever in the past 24 hours?  Fever (Temp > 37.80C / 100F) X   Have you had any of these symptoms in the past 24 hours? New Cough  Sore Throat   Shortness of Breath  Difficulty Breathing  Unexplained Body Aches   X   Have you had any one of these symptoms in the past 24 hours not related to allergies?   Runny Nose  Nasal Congestion  Sneezing   X   If you have had runny nose, nasal congestion, sneezing in the past 24 hours, has it worsened?  X   EXPOSURES - check yes or no X   Have you traveled outside the state in the past 14 days?  X   Have you been in contact with someone with a confirmed diagnosis of COVID-19 or PUI in the past 14 days without wearing appropriate PPE?  X   Have you been living in the same home as a person with confirmed diagnosis of COVID-19 or a PUI (household contact)?    X   Have you been diagnosed with COVID-19?    X              What to do next: Answered NO to all: Answered YES to anything:   Proceed with unit schedule Follow the [facility] Inpatient Flowsheet.

## 2021-04-04 NOTE — Progress Notes (Signed)
Recreation Therapy Notes  Date:  6.13.22 Time: 0930 Location: [address] Dayroom  Group Topic: Stress Management  Goal Area(s) Addresses:  Patient will identify positive stress management techniques. Patient will identify benefits of using stress management post d/c.  Intervention: Stress Managment  Activity :  Meditation.  LRT played a meditation that focused on setting boundaries for your own self care. Patients were to listen and follow as meditation was read to fully engage in activity.  Education:  Stress Management, Discharge Planning.   Education Outcome: Acknowledges Education  Clinical Observations/Feedback:  Pt did not attend group session.    Mikenzi Raysor, LRT/CTRS         Laney Bagshaw A 04/04/2021 11:30 AM 

## 2021-04-04 NOTE — Progress Notes (Signed)
Pt given PRN Vistaril and Trazodone per MAR with HS medication    04/04/21 2300  Psych Admission Type (Psych Patients Only)  Admission Status Voluntary  Psychosocial Assessment  Patient Complaints Anxiety  Eye Contact Fair  Facial Expression Anxious  Affect Anxious  Speech Logical/coherent  Interaction Assertive  Motor Activity Other (Comment) (WDL)  Appearance/Hygiene Unremarkable  Behavior Characteristics Cooperative  Mood Anxious  Thought Process  Coherency WDL  Content WDL  Delusions None reported or observed  Perception WDL  Hallucination None reported or observed  Judgment Poor  Confusion None  Danger to Self  Current suicidal ideation? Denies  Danger to Others  Danger to Others None reported or observed

## 2021-04-04 NOTE — Tx Team (Signed)
Interdisciplinary Treatment and Diagnostic Plan Update  04/04/2021 Time of Session: 1:25pm Cory Floyd MRN: [id number]  Principal Diagnosis: Bipolar 1 disorder, depressed, severe ([place])  Secondary Diagnoses: Principal Problem:   Bipolar 1 disorder, depressed, severe ([name]) Active Problems:   Cocaine abuse with cocaine-induced mood disorder ([name])   Current Medications:  Current Facility-Administered Medications  Medication Dose Route Frequency Provider Last Rate Last Admin   acetaminophen (TYLENOL) tablet 650 mg  650 mg Oral Q6H PRN Bobbitt, Shalon E, NP   650 mg at 04/03/21 2118   alum & mag hydroxide-simeth (MAALOX/MYLANTA) 200-200-20 MG/5ML suspension 30 mL  30 mL Oral Q4H PRN Bobbitt, Shalon E, NP       ARIPiprazole (ABILIFY) tablet 10 mg  10 mg Oral Daily Ethelene Hal, NP   10 mg at 04/04/21 5361   divalproex (DEPAKOTE ER) 24 hr tablet 1,000 mg  1,000 mg Oral QHS Ethelene Hal, NP   1,000 mg at 04/03/21 2118   hydrOXYzine (ATARAX/VISTARIL) tablet 25 mg  25 mg Oral TID PRN Ethelene Hal, NP   25 mg at 04/03/21 2118   magnesium hydroxide (MILK OF MAGNESIA) suspension 30 mL  30 mL Oral Daily PRN Bobbitt, Shalon E, NP       mirtazapine (REMERON) tablet 15 mg  15 mg Oral QHS Ethelene Hal, NP   15 mg at 04/03/21 2118   traZODone (DESYREL) tablet 100 mg  100 mg Oral QHS PRN Bobbitt, Shalon E, NP   100 mg at 04/03/21 2119   PTA Medications: Medications Prior to Admission  Medication Sig Dispense Refill Last Dose   acetaminophen (TYLENOL) 500 MG tablet Take 500-1,000 mg by mouth every 6 (six) hours as needed for headache.   Past Week at unk   ARIPiprazole (ABILIFY) 15 MG tablet Take 1 tablet (15 mg total) by mouth daily. (Patient not taking: Reported on 04/02/2021) 30 tablet 0 Not Taking   divalproex (DEPAKOTE ER) 500 MG 24 hr tablet Take 2 tablets (1,000 mg total) by mouth at bedtime. (Patient not taking: Reported on 04/02/2021) 15 tablet 0 Not Taking     Patient Stressors: Substance abuse  Patient Strengths: Ability for insight Communication skills Motivation for treatment/growth  Treatment Modalities: Medication Management, Group therapy, Case management,  1 to 1 session with clinician, Psychoeducation, Recreational therapy.   Physician Treatment Plan for Primary Diagnosis: Bipolar 1 disorder, depressed, severe ([name]) Long Term Goal(s): Improvement in symptoms so as ready for discharge   Short Term Goals: Ability to identify changes in lifestyle to reduce recurrence of condition will improve Ability to verbalize feelings will improve Ability to disclose and discuss suicidal ideas Ability to demonstrate self-control will improve Compliance with prescribed medications will improve Ability to identify triggers associated with substance abuse/mental health issues will improve Ability to identify and develop effective coping behaviors will improve  Medication Management: Evaluate patient's response, side effects, and tolerance of medication regimen.  Therapeutic Interventions: 1 to 1 sessions, Unit Group sessions and Medication administration.  Evaluation of Outcomes: Not Met  Physician Treatment Plan for Secondary Diagnosis: Principal Problem:   Bipolar 1 disorder, depressed, severe ([name]) Active Problems:   Cocaine abuse with cocaine-induced mood disorder ([name])  Long Term Goal(s): Improvement in symptoms so as ready for discharge   Short Term Goals: Ability to identify changes in lifestyle to reduce recurrence of condition will improve Ability to verbalize feelings will improve Ability to disclose and discuss suicidal ideas Ability to demonstrate self-control will improve Compliance with  prescribed medications will improve Ability to identify triggers associated with substance abuse/mental health issues will improve Ability to identify and develop effective coping behaviors will improve     Medication Management: Evaluate  patient's response, side effects, and tolerance of medication regimen.  Therapeutic Interventions: 1 to 1 sessions, Unit Group sessions and Medication administration.  Evaluation of Outcomes: Not Met   RN Treatment Plan for Primary Diagnosis: Bipolar 1 disorder, depressed, severe ([name]) Long Term Goal(s): Knowledge of disease and therapeutic regimen to maintain health will improve  Short Term Goals: Ability to remain free from injury will improve, Ability to verbalize frustration and anger appropriately will improve, Ability to identify and develop effective coping behaviors will improve, and Compliance with prescribed medications will improve  Medication Management: RN will administer medications as ordered by provider, will assess and evaluate patient's response and provide education to patient for prescribed medication. RN will report any adverse and/or side effects to prescribing provider.  Therapeutic Interventions: 1 on 1 counseling sessions, Psychoeducation, Medication administration, Evaluate responses to treatment, Monitor vital signs and CBGs as ordered, Perform/monitor CIWA, COWS, AIMS and Fall Risk screenings as ordered, Perform wound care treatments as ordered.  Evaluation of Outcomes: Not Met   LCSW Treatment Plan for Primary Diagnosis: Bipolar 1 disorder, depressed, severe ([name]) Long Term Goal(s): Safe transition to appropriate next level of care at discharge, Engage patient in therapeutic group addressing interpersonal concerns.  Short Term Goals: Engage patient in aftercare planning with referrals and resources, Increase social support, Increase ability to appropriately verbalize feelings, Identify triggers associated with mental health/substance abuse issues, and Increase skills for wellness and recovery  Therapeutic Interventions: Assess for all discharge needs, 1 to 1 time with Social worker, Explore available resources and support systems, Assess for adequacy in community  support network, Educate family and significant other(s) on suicide prevention, Complete Psychosocial Assessment, Interpersonal group therapy.  Evaluation of Outcomes: Not Met   Progress in Treatment: Attending groups: No. Participating in groups: No. Taking medication as prescribed: Yes. Toleration medication: Yes. Family/Significant other contact made: No, will contact:  declined consents Patient understands diagnosis: Yes. Discussing patient identified problems/goals with staff: Yes. Medical problems stabilized or resolved: Yes. Denies suicidal/homicidal ideation: Yes. Issues/concerns per patient self-inventory: No.   New problem(s) identified: No, Describe:  none  New Short Term/Long Term Goal(s): detox, medication management for mood stabilization; elimination of SI thoughts; development of comprehensive mental wellness/sobriety plan   Patient Goals:  To detox and to increase recovery   Discharge Plan or Barriers: Pt is currently homeless and unsure where he will be staying at discharge. Pt is currently seen by [facility].   Reason for Continuation of Hospitalization: Depression Medication stabilization Suicidal ideation Withdrawal symptoms  Estimated Length of Stay: 3-5 days  Attendees: Patient: Did not attend 04/04/2021   Physician: Ethelene Browns, MD 04/04/2021   Nursing:  04/04/2021   RN Care Manager: 04/04/2021   Social Worker: Darletta Moll 04/04/2021   Recreational Therapist:  04/04/2021   Other:  04/04/2021   Other:  04/04/2021   Other: 04/04/2021     Scribe for Treatment Team: Vassie Moselle, LCSW 04/04/2021 3:18 PM

## 2021-04-04 NOTE — Progress Notes (Addendum)
[facility] MD Progress Note  04/04/2021 11:14 AM Cory Floyd  MRN:  [id number] Subjective:  "I feel safe here. I need more structure to survive out there. I just use drugs to cope and escape my situation. I would like long term residential treatment. I have thoughts of shooting myself if I can't find a place to live."   Objective: Cory Floyd is a 45 y.o. male with history of psychiatric and substance abuse problems for years. States he has been depressed and suicidal with plan to use a gun. He states he has access to a gun at his house, but also states he is homeless so it isn't clear what his living situation is. He has had several previous ED visits for same. States his ACT team worker came to check on him today and brought him in for evaluation.  Evaluation on the unit today 04/04/2021: Patient was seen, chart reviewed and case discussed with the treatment team. Patient is lying in bed in his room. He makes poor eye contact. He is homeless. He is isolating to his room, not attending group activities as encouraged by staff.  He stated he did got to the cafeteria for breakfast and lunch. He reported good sleep and a good appetite. He denies HI/AVH, paranoia and delusions. His Valproic Acid level was <10 today. Lipid panel is within normal limits. His TSH is normal at 1.524. No new labs today.   Principal Problem: Bipolar 1 disorder, depressed, severe (HCC) Diagnosis: Principal Problem:   Bipolar 1 disorder, depressed, severe (H[facility]) Active Problems:   Cocaine abuse with cocaine-induced mood disorder ([facility])  Total Time spent with patient:  15 minutes  Past Psychiatric History: See H&P  Past Medical History:  Past Medical History:  Diagnosis Date   Alcoholic (HCC)    clean for 3 years   Anginal pain (HCC)    Arthritis    Bipolar 1 disorder (HCC)    COVID-19    Depressed    Diabetes mellitus without complication (HCC)    Dyspnea    Fatty liver    Hypertension    Schizophrenia (HCC)      Past Surgical History:  Procedure Laterality Date   COLONOSCOPY  02/05/2014   diverticulosis, hyperplastic polyp x 2   fracture arm Right    KNEE SURGERY Right 2013   NASAL SEPTUM SURGERY     NASAL TURBINATE REDUCTION Bilateral 12/12/2016   Procedure: TURBINATE REDUCTION/SUBMUCOSAL RESECTION;  Surgeon: Linus Salmons, MD;  Location: [facility] ORS;  Service: ENT;  Laterality: Bilateral;   Family History:  Family History  Problem Relation Age of Onset   Hepatitis C Mother    Cancer Mother    Cancer Maternal Aunt    Hypertension Maternal Grandmother    Family Psychiatric  History: See H&P Social History:  Social History   Substance and Sexual Activity  Alcohol Use Yes   Comment: 1/2-5th pint of liquor     Social History   Substance and Sexual Activity  Drug Use Yes   Types: Cocaine, Marijuana   Comment: heroin    Social History   Socioeconomic History   Marital status: Divorced    Spouse name: Not on file   Number of children: Not on file   Years of education: Not on file   Highest education level: Not on file  Occupational History   Not on file  Tobacco Use   Smoking status: Every Day    Packs/day: 1.00    Years: 20.00  Pack years: 20.00    Types: Cigarettes   Smokeless tobacco: Never  Vaping Use   Vaping Use: Never used  Substance and Sexual Activity   Alcohol use: Yes    Comment: 1/2-5th pint of liquor   Drug use: Yes    Types: Cocaine, Marijuana    Comment: heroin   Sexual activity: Yes    Birth control/protection: None  Other Topics Concern   Not on file  Social History Narrative   Not on file   Social Determinants of Health   Financial Resource Strain: Not on file  Food Insecurity: Not on file  Transportation Needs: Not on file  Physical Activity: Not on file  Stress: Not on file  Social Connections: Not on file   Additional Social History:    Sleep: Good  Appetite:  Good  Current Medications: Current Facility-Administered  Medications  Medication Dose Route Frequency Provider Last Rate Last Admin   acetaminophen (TYLENOL) tablet 650 mg  650 mg Oral Q6H PRN Bobbitt, Shalon E, NP   650 mg at 04/03/21 2118   alum & mag hydroxide-simeth (MAALOX/MYLANTA) 200-200-20 MG/5ML suspension 30 mL  30 mL Oral Q4H PRN Bobbitt, Shalon E, NP       ARIPiprazole (ABILIFY) tablet 10 mg  10 mg Oral Daily Laveda Abbe, NP   10 mg at 04/04/21 0821   divalproex (DEPAKOTE ER) 24 hr tablet 1,000 mg  1,000 mg Oral QHS Laveda Abbe, NP   1,000 mg at 04/03/21 2118   hydrOXYzine (ATARAX/VISTARIL) tablet 25 mg  25 mg Oral TID PRN Laveda Abbe, NP   25 mg at 04/03/21 2118   magnesium hydroxide (MILK OF MAGNESIA) suspension 30 mL  30 mL Oral Daily PRN Bobbitt, Shalon E, NP       mirtazapine (REMERON) tablet 15 mg  15 mg Oral QHS Laveda Abbe, NP   15 mg at 04/03/21 2118   traZODone (DESYREL) tablet 100 mg  100 mg Oral QHS PRN Bobbitt, Shalon E, NP   100 mg at 04/03/21 2119    Lab Results:  Results for orders placed or performed during the hospital encounter of 04/02/21 (from the past 48 hour(s))  TSH     Status: None   Collection Time: 04/02/21  6:00 PM  Result Value Ref Range   TSH 1.524 0.350 - 4.500 uIU/mL    Comment: Performed by a 3rd Generation assay with a functional sensitivity of <=0.01 uIU/mL. Performed at [facility], [address] [place], [place] [address]   Lipid panel     Status: None   Collection Time: 04/02/21  6:00 PM  Result Value Ref Range   Cholesterol 168 0 - 200 mg/dL   Triglycerides 536 <644 mg/dL   HDL 61 >03 mg/dL   Total CHOL/HDL Ratio 2.8 RATIO   VLDL 26 0 - 40 mg/dL   LDL Cholesterol 81 0 - 99 mg/dL    Comment:        Total Cholesterol/HDL:CHD Risk Coronary Heart Disease Risk Table                     Men   Women  1/2 Average Risk   3.4   3.3  Average Risk       5.0   4.4  2 X Average Risk   9.6   7.1  3 X Average Risk  23.4   11.0        Use  the calculated  Patient Ratio above and the CHD Risk Table to determine the patient's CHD Risk.        ATP III CLASSIFICATION (LDL):  <100     mg/dL   Optimal  355-974  mg/dL   Near or Above                    Optimal  130-159  mg/dL   Borderline  163-845  mg/dL   High  >364     mg/dL   Very High Performed at [facility], [address] [place], [place] [address]   Magnesium     Status: None   Collection Time: 04/02/21  6:00 PM  Result Value Ref Range   Magnesium 2.0 1.7 - 2.4 mg/dL    Comment: Performed at [facility], [address], [place], [place] [address]  Hemoglobin A1c     Status: None   Collection Time: 04/02/21  6:00 PM  Result Value Ref Range   Hgb A1c MFr Bld 5.5 4.8 - 5.6 %    Comment: (NOTE)         Prediabetes: 5.7 - 6.4         Diabetes: >6.4         Glycemic control for adults with diabetes: <7.0    Mean Plasma Glucose 111 mg/dL    Comment: (NOTE) Performed At: [facility] [address] [place], [place] [id number] Jolene Schimke MD GQ:[id number]   Valproic acid level     Status: Abnormal   Collection Time: 04/02/21  6:00 PM  Result Value Ref Range   Valproic Acid Lvl <10 (L) 50.0 - 100.0 ug/mL    Comment: RESULTS CONFIRMED BY MANUAL DILUTION RESULT REPEATED AND VERIFIED Performed at [facility], [address], [place], [place] [address]     Blood Alcohol level:  Lab Results  Component Value Date   ETH <10 04/01/2021   ETH <10 03/04/2021    Metabolic Disorder Labs: Lab Results  Component Value Date   HGBA1C 5.5 04/02/2021   MPG 111 04/02/2021   MPG 119.76 01/23/2021   No results found for: PROLACTIN Lab Results  Component Value Date   CHOL 168 04/02/2021   TRIG 131 04/02/2021   HDL 61 04/02/2021   CHOLHDL 2.8 04/02/2021   VLDL 26 04/02/2021   LDLCALC 81 04/02/2021   LDLCALC 105 (H) 01/23/2021    Physical Findings: AIMS:  , ,  ,  ,    CIWA:    COWS:      Musculoskeletal: Strength & Muscle Tone: within normal limits Gait & Station: normal Patient leans: N/A  Psychiatric Specialty Exam:  Presentation  General Appearance: Disheveled  Eye Contact: Poor  Speech: Normal Rate  Speech Volume:Decreased  Handedness:Right  Mood and Affect  Mood:Euthymic  Affect:Congruent  Thought Process  Thought Processes:Coherent  Descriptions of Associations:Circumstantial  Orientation:Full (Time, Place and Person)  Thought Content:Logical  History of Schizophrenia/Schizoaffective disorder:No  Duration of Psychotic Symptoms:Less than six months  Hallucinations:No data recorded Ideas of Reference:None  Suicidal Thoughts: Passive SI with plan to shoot self. However, suicidal thoughts appears to be linked to his housing issues.  Homicidal Thoughts: Denies  Sensorium  Memory:Immediate Fair; Recent Fair; Remote Fair  Judgment:Poor  Insight:Lacking  Executive Functions  Concentration: Fair  Attention Span: Fair  Recall:Fair  Fund of Knowledge:Fair  Language:Good  Psychomotor Activity  Psychomotor Activity: No data recorded  Assets  Assets:Desire for Improvement; Resilience  Sleep  Sleep:  No data recorded  Physical Exam: Physical Exam Vitals and nursing note reviewed.  Constitutional:      Appearance: Normal appearance.  HENT:     Head: Normocephalic.  Pulmonary:     Effort: Pulmonary effort is normal.  Musculoskeletal:        General: Normal range of motion.     Cervical back: Normal range of motion.  Neurological:     Mental Status: He is alert and oriented to person, place, and time.  Psychiatric:        Attention and Perception: Attention normal. He does not perceive auditory or visual hallucinations.        Mood and Affect: Mood is anxious and depressed.        Speech: Speech normal.        Behavior: Behavior is withdrawn.        Thought Content: Thought content is not paranoid or delusional. Thought  content includes suicidal ideation. Thought content does not include homicidal ideation. Thought content does not include homicidal or suicidal plan.   Review of Systems  Constitutional:  Negative for fever.  HENT:  Negative for congestion, sinus pain and sore throat.   Respiratory:  Negative for cough and shortness of breath.   Gastrointestinal: Negative.   Genitourinary: Negative.   Musculoskeletal: Negative.   Neurological: Negative.   Psychiatric/Behavioral:  Positive for depression, substance abuse and suicidal ideas.   Blood pressure 119/76, pulse 85, resp. rate 16. There is no height or weight on file to calculate BMI.   Treatment Plan Summary: Daily contact with patient to assess and evaluate symptoms and progress in treatment and Medication management  Bipolar Depression: -Continue Remeron 15 mg PO at bedtime for depression/insomnia -Continue Abilify 10 mg PO daily for depression  Mood stabilization:  -Continue Depakote ER 1,000 mg at bedtime  Insomnia: -Continue Trazodone 50 mg PO at bedtime PRN  Anxiety: -Continue Vistaril 25 mg PO TID PRN  Continue with every 15 minute safety checks Encourage participation in the therapeutic milieu Discharge planning in progress - patient wants referral to residential substance abuse treatment programs. EOS 3-5 days    Fransisca KaufmannAVIS, Raymonda Pell, NP 04/04/2021, 11:14 AM

## 2021-04-04 NOTE — [facility] Suicide Risk Assessment (Signed)
[facility] INPATIENT:  Family/Significant Other Suicide Prevention Education  Suicide Prevention Education:  Patient Refusal for Family/Significant Other Suicide Prevention Education: The patient Cory Floyd has refused to provide written consent for family/significant other to be provided Family/Significant Other Suicide Prevention Education during admission and/or prior to discharge.  Physician notified.  The patient states that he has no friends or family to be contacted.  CSW completed SPE with the patient.    [place] Sarin [place] 04/04/2021, 10:52 AM

## 2021-04-04 NOTE — [facility] Group Notes (Signed)
Patient did not attend morning goals and activity group. 

## 2021-04-05 MED ORDER — OLANZAPINE 10 MG PO TBDP: 10.0000 mg | ORAL_TABLET | Freq: Three times a day (TID) | ORAL | Status: DC | PRN

## 2021-04-05 MED ORDER — LORAZEPAM 1 MG PO TABS: 1.0000 mg | ORAL_TABLET | Freq: Four times a day (QID) | ORAL | Status: DC | PRN

## 2021-04-05 MED ORDER — ZIPRASIDONE MESYLATE 20 MG IM SOLR: 20.0000 mg | Freq: Four times a day (QID) | INTRAMUSCULAR | Status: DC | PRN

## 2021-04-05 NOTE — Progress Notes (Signed)
[facility] MD Progress Note  04/05/2021 9:45 AM MUNG RINKER  MRN:  [id number] Subjective:  "I did not sleep well, I don't know why. I want to go to rehab. My thoughts are clearer today, they are not racing anymore." "   Objective: BURAK ZERBE is a 45 y.o. male with history of psychiatric and substance abuse problems for years. States he has been depressed and suicidal with plan to use a gun. He states he has access to a gun at his house, but also states he is homeless so it isn't clear what his living situation is. He has had several previous ED visits for same. States his ACT team worker came to check on him today and brought him in for evaluation.  Evaluation on the unit today 04/04/2021: Patient was seen, chart reviewed and case discussed with the treatment team. Patient is lying in bed in his room. He makes fair eye contact. He is homeless. He stated he wants a long term substance abuse treatment bed. Referrals have been made by CSW, however he is a registered sex offender and his choices are limited of where he can go for treatment. He has been encouraged to make phone calls for treatment beds. He is staying in his room most of the day. He did attend an afternoon group yesterday but left shortly after it started. He stated he feels like his thoughts are clearer about what he needs to do going forward. He could not tell this provider what his plan is when he is discharged. He understands he can not stay here until a treatment bed is found. He was informed discharge will happen in a day, maybe 2 and he needs to be working on where he is going. He denies SI/HI/AVH, paranoia and delusions today. He is taking his medications. He is isolating to his room and not attending group activities as encouraged by staff.  He stated he did got to the cafeteria for breakfast. He reported a good appetite.  No new labs today. Will continue to monitor for safety and participation in the milieu.   Principal Problem: Cocaine  abuse with cocaine-induced mood disorder (HCC) Diagnosis: Principal Problem:   Cocaine abuse with cocaine-induced mood disorder (HCC) Active Problems:   Bipolar 1 disorder, depressed, severe (HCC)  Total Time spent with patient:  15 minutes  Past Psychiatric History: See H&P  Past Medical History:  Past Medical History:  Diagnosis Date   Alcoholic (HCC)    clean for 3 years   Anginal pain (HCC)    Arthritis    Bipolar 1 disorder (HCC)    COVID-19    Depressed    Diabetes mellitus without complication (HCC)    Dyspnea    Fatty liver    Hypertension    Schizophrenia (HCC)     Past Surgical History:  Procedure Laterality Date   COLONOSCOPY  02/05/2014   diverticulosis, hyperplastic polyp x 2   fracture arm Right    KNEE SURGERY Right 2013   NASAL SEPTUM SURGERY     NASAL TURBINATE REDUCTION Bilateral 12/12/2016   Procedure: TURBINATE REDUCTION/SUBMUCOSAL RESECTION;  Surgeon: Linus Salmons, MD;  Location: [facility] ORS;  Service: ENT;  Laterality: Bilateral;   Family History:  Family History  Problem Relation Age of Onset   Hepatitis C Mother    Cancer Mother    Cancer Maternal Aunt    Hypertension Maternal Grandmother    Family Psychiatric  History: See H&P Social History:  Social History  Substance and Sexual Activity  Alcohol Use Yes   Comment: 1/2-5th pint of liquor     Social History   Substance and Sexual Activity  Drug Use Yes   Types: Cocaine, Marijuana   Comment: heroin    Social History   Socioeconomic History   Marital status: Divorced    Spouse name: Not on file   Number of children: Not on file   Years of education: Not on file   Highest education level: Not on file  Occupational History   Not on file  Tobacco Use   Smoking status: Every Day    Packs/day: 1.00    Years: 20.00    Pack years: 20.00    Types: Cigarettes   Smokeless tobacco: Never  Vaping Use   Vaping Use: Never used  Substance and Sexual Activity   Alcohol use: Yes     Comment: 1/2-5th pint of liquor   Drug use: Yes    Types: Cocaine, Marijuana    Comment: heroin   Sexual activity: Yes    Birth control/protection: None  Other Topics Concern   Not on file  Social History Narrative   Not on file   Social Determinants of Health   Financial Resource Strain: Not on file  Food Insecurity: Not on file  Transportation Needs: Not on file  Physical Activity: Not on file  Stress: Not on file  Social Connections: Not on file   Additional Social History:    Sleep: Good  Appetite:  Good  Current Medications: Current Facility-Administered Medications  Medication Dose Route Frequency Provider Last Rate Last Admin   acetaminophen (TYLENOL) tablet 650 mg  650 mg Oral Q6H PRN Bobbitt, Shalon E, NP   650 mg at 04/04/21 2122   alum & mag hydroxide-simeth (MAALOX/MYLANTA) 200-200-20 MG/5ML suspension 30 mL  30 mL Oral Q4H PRN Bobbitt, Shalon E, NP       ARIPiprazole (ABILIFY) tablet 10 mg  10 mg Oral Daily Laveda Abbe, NP   10 mg at 04/05/21 0810   divalproex (DEPAKOTE ER) 24 hr tablet 1,000 mg  1,000 mg Oral QHS Laveda Abbe, NP   1,000 mg at 04/04/21 2123   hydrOXYzine (ATARAX/VISTARIL) tablet 25 mg  25 mg Oral TID PRN Laveda Abbe, NP   25 mg at 04/04/21 2122   OLANZapine zydis (ZYPREXA) disintegrating tablet 10 mg  10 mg Oral Q8H PRN Antonieta Pert, MD       And   LORazepam (ATIVAN) tablet 1 mg  1 mg Oral Q6H PRN Antonieta Pert, MD       And   ziprasidone (GEODON) injection 20 mg  20 mg Intramuscular Q6H PRN Antonieta Pert, MD       magnesium hydroxide (MILK OF MAGNESIA) suspension 30 mL  30 mL Oral Daily PRN Bobbitt, Shalon E, NP       mirtazapine (REMERON) tablet 15 mg  15 mg Oral QHS Laveda Abbe, NP   15 mg at 04/04/21 2123   traZODone (DESYREL) tablet 100 mg  100 mg Oral QHS PRN Bobbitt, Shalon E, NP   100 mg at 04/04/21 2123    Lab Results:  No results found for this or any previous visit (from the  past 48 hour(s)).   Blood Alcohol level:  Lab Results  Component Value Date   [facility] <10 04/01/2021   ETH <10 03/04/2021    Metabolic Disorder Labs: Lab Results  Component Value Date   HGBA1C 5.5 04/02/2021  MPG 111 04/02/2021   MPG 119.76 01/23/2021   No results found for: PROLACTIN Lab Results  Component Value Date   CHOL 168 04/02/2021   TRIG 131 04/02/2021   HDL 61 04/02/2021   CHOLHDL 2.8 04/02/2021   VLDL 26 04/02/2021   LDLCALC 81 04/02/2021   LDLCALC 105 (H) 01/23/2021    Physical Findings: AIMS: Facial and Oral Movements Muscles of Facial Expression: None, normal Lips and Perioral Area: None, normal Jaw: None, normal Tongue: None, normal,Extremity Movements Upper (arms, wrists, hands, fingers): None, normal Lower (legs, knees, ankles, toes): None, normal, Trunk Movements Neck, shoulders, hips: None, normal, Overall Severity Severity of abnormal movements (highest score from questions above): None, normal Incapacitation due to abnormal movements: None, normal Patient's awareness of abnormal movements (rate only patient's report): No Awareness, Dental Status Current problems with teeth and/or dentures?: No Does patient usually wear dentures?: No  CIWA:    COWS:     Musculoskeletal: Strength & Muscle Tone: within normal limits Gait & Station: normal Patient leans: N/A  Psychiatric Specialty Exam:  Presentation  General Appearance: Disheveled  Eye Contact: Poor  Speech: Normal Rate  Speech Volume:Decreased  Handedness:Right  Mood and Affect  Mood:Euthymic  Affect:Congruent  Thought Process  Thought Processes:Coherent  Descriptions of Associations:Circumstantial  Orientation:Full (Time, Place and Person)  Thought Content:Logical  History of Schizophrenia/Schizoaffective disorder:No  Duration of Psychotic Symptoms:Less than six months  Hallucinations:No data recorded Ideas of Reference:None  Suicidal Thoughts: Passive SI with plan  to shoot self. However, suicidal thoughts appears to be linked to his housing issues.  Homicidal Thoughts: Denies  Sensorium  Memory:Immediate Fair; Recent Fair; Remote Fair  Judgment:Poor  Insight:Lacking  Executive Functions  Concentration: Fair  Attention Span: Fair  Recall:Fair  Fund of Knowledge:Fair  Language:Good  Psychomotor Activity  Psychomotor Activity: No data recorded  Assets  Assets:Desire for Improvement; Resilience  Sleep  Sleep: No data recorded  Physical Exam: Physical Exam Vitals and nursing note reviewed.  Constitutional:      Appearance: Normal appearance.  HENT:     Head: Normocephalic.  Pulmonary:     Effort: Pulmonary effort is normal.  Musculoskeletal:        General: Normal range of motion.     Cervical back: Normal range of motion.  Neurological:     Mental Status: He is alert and oriented to person, place, and time.  Psychiatric:        Attention and Perception: Attention normal. He does not perceive auditory or visual hallucinations.        Mood and Affect: Mood is anxious and depressed.        Speech: Speech normal.        Behavior: Behavior is withdrawn.        Thought Content: Thought content is not paranoid or delusional. Thought content includes suicidal ideation. Thought content does not include homicidal ideation. Thought content does not include homicidal or suicidal plan.   Review of Systems  Constitutional:  Negative for fever.  HENT:  Negative for congestion, sinus pain and sore throat.   Respiratory:  Negative for cough and shortness of breath.   Gastrointestinal: Negative.   Genitourinary: Negative.   Musculoskeletal: Negative.   Neurological: Negative.   Psychiatric/Behavioral:  Positive for depression, substance abuse and suicidal ideas.   Blood pressure 108/73, pulse 95, temperature 97.7 F (36.5 C), temperature source Oral, resp. rate 16. There is no height or weight on file to calculate BMI.   Treatment  Plan Summary:  Daily contact with patient to assess and evaluate symptoms and progress in treatment and Medication management  Bipolar Depression: -Continue Remeron 15 mg PO at bedtime for depression/insomnia -Continue Abilify 10 mg PO daily for depression  Mood stabilization:  -Continue Depakote ER 1,000 mg at bedtime  Insomnia: -Continue Trazodone 50 mg PO at bedtime PRN  Anxiety: -Continue Vistaril 25 mg PO TID PRN  Continue with every 15 minute safety checks Encourage participation in the therapeutic milieu Discharge planning in progress - patient wants referral to residential substance abuse treatment programs. EOS 3-5 days    Laveda AbbeLaurie Britton Zhaniya Swallows, NP 04/05/2021, 12:07 PM

## 2021-04-05 NOTE — BHH Counselor (Signed)
CSW provided the patient with a packet of resources that contains: housing resources, free and reduced cost food resources, suicide prevention resources and numbers, and GoodRX cards.  CSW also provided the client with a list of residential treatment options for substance use.  

## 2021-04-05 NOTE — Plan of Care (Signed)
  Problem: Self-Concept: Goal: Level of anxiety will decrease Outcome: Progressing   Problem: Coping: Goal: Coping ability will improve Outcome: Progressing   Problem: Health Behavior/Discharge Planning: Goal: Identification of resources available to assist in meeting health care needs will improve Outcome: Progressing   Problem: Medication: Goal: Compliance with prescribed medication regimen will improve Outcome: Progressing

## 2021-04-05 NOTE — Progress Notes (Signed)
Adult Psychoeducational Group Note  Date:  04/05/2021 Time:  4:59 PM  Group Topic/Focus:  Healthy Communication:   The focus of this group is to discuss communication, barriers to communication, as well as healthy ways to communicate with others.  Participation Level:  Did Not Attend  Margaret Pyle 04/05/2021, 4:59 PM

## 2021-04-05 NOTE — Progress Notes (Signed)
Adult Psychoeducational Group Note  Date:  04/05/2021 Time:  2:53 PM  Group Topic/Focus:  Goals Group:   The focus of this group is to help patients establish daily goals to achieve during treatment and discuss how the patient can incorporate goal setting into their daily lives to aide in recovery.  Participation Level:  Did Not Attend  Margaret Pyle 04/05/2021, 2:53 PM

## 2021-04-05 NOTE — Progress Notes (Signed)
Recreation Therapy Notes  Animal-Assisted Activity (AAA) Program Checklist/Progress Notes Patient Eligibility Criteria Checklist & Daily Group note for Rec Tx Intervention  Date: 6.14.22 Time: 1430 Location: [address]   AAA/T Program Assumption of Risk Form signed by Engineer, production or Parent Legal Guardian YES  Patient is free of allergies or severe asthma YES   Patient reports no fear of animals YES   Patient reports no history of cruelty to animals YES  Patient understands his/her participation is voluntary YES  Patient washes hands before animal contact YES  Patient washes hands after animal contact YES   Education: Charity fundraiser, Appropriate Animal Interaction   Education Outcome: Acknowledges understanding/In group clarification offered/Needs additional education.   Clinical Observations/Feedback: Pt did not attend activity.      [name], LRT/CTRS        [name] A 04/05/2021 3:41 PM

## 2021-04-05 NOTE — B[facility] Group Notes (Signed)
Patient did not attend group   ADULT GRIEF GROUP NOTE:   Spiritual care group on grief and loss facilitated by chaplain Katy Lydiana Milley, BCC   Group Goal:   Support / Education around grief and loss   Members engage in facilitated group support and psycho-social education.   Group Description:   Following introductions and group rules, group members engaged in facilitated group dialog and support around topic of loss, with particular support around experiences of loss in their lives. Group Identified types of loss (relationships / self / things) and identified patterns, circumstances, and changes that precipitate losses. Reflected on thoughts / feelings around loss, normalized grief responses, and recognized variety in grief experience. Group noted Worden's four tasks of grief in discussion.   Group drew on Adlerian / Rogerian, narrative, MI,    

## 2021-04-05 NOTE — Progress Notes (Signed)
Progress note    04/05/21 0810  Psych Admission Type (Psych Patients Only)  Admission Status Voluntary  Psychosocial Assessment  Patient Complaints Agitation;Anxiety  Eye Contact Fair  Facial Expression Angry;Anxious  Affect Angry;Anxious;Irritable  Speech Logical/coherent  Interaction Assertive  Motor Activity Fidgety  Appearance/Hygiene In scrubs  Behavior Characteristics Cooperative;Appropriate to situation;Agitated;Anxious;Fidgety  Mood Anxious;Labile;Angry;Pleasant  Thought Process  Coherency Concrete thinking  Content WDL  Delusions None reported or observed  Perception WDL  Hallucination None reported or observed  Judgment Poor  Confusion None  Danger to Self  Current suicidal ideation? Passive  Self-Injurious Behavior Some self-injurious ideation observed or expressed.  No lethal plan expressed   Agreement Not to Harm Self Yes  Description of Agreement pt agrees to approach staff before harming self at bbh  Danger to Others  Danger to Others None reported or observed

## 2021-04-06 MED ORDER — TRAZODONE HCL 100 MG PO TABS: 100.0000 mg | ORAL_TABLET | Freq: Every evening | ORAL | 0 refills | Status: DC | PRN

## 2021-04-06 MED ORDER — MIRTAZAPINE 15 MG PO TABS: 15.0000 mg | ORAL_TABLET | Freq: Every day | ORAL | 0 refills | Status: DC

## 2021-04-06 MED ORDER — ARIPIPRAZOLE 10 MG PO TABS: 10.0000 mg | ORAL_TABLET | Freq: Every day | ORAL | 0 refills | Status: DC

## 2021-04-06 MED ORDER — HYDROXYZINE HCL 25 MG PO TABS: 25.0000 mg | ORAL_TABLET | Freq: Three times a day (TID) | ORAL | 0 refills | Status: DC | PRN

## 2021-04-06 MED ORDER — DIVALPROEX SODIUM ER 500 MG PO TB24: 1000.0000 mg | ORAL_TABLET | Freq: Every day | ORAL | 0 refills | Status: DC

## 2021-04-06 NOTE — Progress Notes (Signed)
Pt said that he will be discharging to the street and has already brought this up with the treatment team. He said that he was interested in rehab for 2 weeks or 30 days. He is anxious and frustrated about his discharge situation. He rated his anxiety a 10 and his depression an 8 on a scale of 0-10 (10 being the worse). He said he spent most of his day sleeping in bed. Pt did attend and participate in group last night. He hasn't noticed any changes in his mood with the medication. Reinforced education with the pt that it can take up to 4-6 weeks. Pt denies SI/HI and AVH. Active listening, reassurance, and support provided. Medications administered as ordered by provider. Q 15 min safety checks continue. Pt's safety has been maintained.    04/05/21 2143  Psych Admission Type (Psych Patients Only)  Admission Status Voluntary  Psychosocial Assessment  Patient Complaints Anxiety;Depression;Irritability;Worrying  Eye Contact Fair  Facial Expression Anxious  Affect Anxious;Irritable  Speech Logical/coherent  Interaction Assertive  Motor Activity Other (Comment) (steady)  Appearance/Hygiene Unremarkable  Behavior Characteristics Cooperative;Appropriate to situation;Anxious;Irritable  Mood Depressed;Anxious;Irritable;Pleasant  Thought Process  Coherency Concrete thinking  Content Blaming others  Delusions None reported or observed  Perception WDL  Hallucination None reported or observed  Judgment Poor  Confusion None  Danger to Self  Current suicidal ideation? Denies  Self-Injurious Behavior No self-injurious ideation or behavior indicators observed or expressed   Agreement Not to Harm Self Yes  Description of Agreement pt agrees to approach staff before harming self at bbh  Danger to Others  Danger to Others None reported or observed

## 2021-04-06 NOTE — Progress Notes (Signed)
  [facility] Adult Case Management Discharge Plan :  Will you be returning to the same living situation after discharge:  No. IRC At discharge, do you have transportation home?: Yes,  Safe Transport  Do you have the ability to pay for your medications: Yes,  Medicare   Release of information consent forms completed and in the chart;  Patient's signature needed at discharge.  Patient to Follow up at:  Follow-up Information     [facility] Follow up.   Why: Follow up with your ACTT Team. Contact information: [address] [place] [address] [phone]         [facility] Call.   Specialty: Addiction Medicine Why: A referral has been made to this provider for residential substance use treatment. Please call this provider daily to see if there is an available bed. Contact information: [address] [place] [address] [phone]         [facility] Call.   Why: A referral has been made to this provider for residential substance use treatment.  Please call daily to see if there is an available bed. Contact information: [address] [place] [address] [phone]                 Next level of care provider has access to [facility] Link:no  Safety Planning and Suicide Prevention discussed: Yes,  with patient      Has patient been referred to the Quitline?: Patient refused referral  Patient has been referred for addiction treatment: Yes  Aram Beecham, LCSWA 04/06/2021, 10:08 AM

## 2021-04-06 NOTE — [facility] Suicide Risk Assessment (Signed)
[facility] Discharge Suicide Risk Assessment   Principal Problem: Cocaine abuse with cocaine-induced mood disorder (HCC) Discharge Diagnoses: Principal Problem:   Cocaine abuse with cocaine-induced mood disorder (HCC) Active Problems:   Bipolar 1 disorder, depressed, severe (HCC)   Total Time spent with patient: 15 minutes  Musculoskeletal: Strength & Muscle Tone: within normal limits Gait & Station: normal Patient leans: N/A  Psychiatric Specialty Exam  Presentation  General Appearance: Appropriate for Environment  Eye Contact:Fair  Speech:Normal Rate  Speech Volume:Normal  Handedness:Right   Mood and Affect  Mood:Anxious  Duration of Depression Symptoms: Greater than two weeks  Affect:Congruent   Thought Process  Thought Processes:Coherent  Descriptions of Associations:Intact  Orientation:Full (Time, Place and Person)  Thought Content:Logical  History of Schizophrenia/Schizoaffective disorder:No  Duration of Psychotic Symptoms:Less than six months  Hallucinations:Hallucinations: None  Ideas of Reference:None  Suicidal Thoughts:Suicidal Thoughts: No  Homicidal Thoughts:No data recorded  Sensorium  Memory:Immediate Fair; Recent Fair; Remote Fair  Judgment:Fair  Insight:Fair   Executive Functions  Concentration:Fair  Attention Span:Fair  Recall:Fair  Fund of Knowledge:Fair  Language:Fair   Psychomotor Activity  Psychomotor Activity:Psychomotor Activity: Normal   Assets  Assets:Desire for Improvement; Resilience   Sleep  Sleep:Sleep: Good Number of Hours of Sleep: 6.5   Physical Exam: Physical Exam Vitals and nursing note reviewed.  Constitutional:      Appearance: Normal appearance.  HENT:     Head: Normocephalic and atraumatic.  Pulmonary:     Effort: Pulmonary effort is normal.  Neurological:     General: No focal deficit present.     Mental Status: He is alert and oriented to person, place, and time.   Review of  Systems  All other systems reviewed and are negative. Blood pressure 109/71, pulse 80, temperature 97.9 F (36.6 C), temperature source Oral, resp. rate 18, SpO2 100 %. There is no height or weight on file to calculate BMI.  Mental Status Per Nursing Assessment::   On Admission:  Suicidal ideation indicated by patient, Self-harm thoughts, Self-harm behaviors, Suicide plan, Intention to act on suicide plan, Belief that plan would result in death  Demographic Factors:  Male, Divorced or widowed, Caucasian, Low socioeconomic status, and Unemployed  Loss Factors: NA  Historical Factors: Prior suicide attempts and Impulsivity  Risk Reduction Factors:   NA  Continued Clinical Symptoms:  Bipolar Disorder:   Depressive phase Alcohol/Substance Abuse/Dependencies  Cognitive Features That Contribute To Risk:  Thought constriction (tunnel vision)    Suicide Risk:  Minimal: No identifiable suicidal ideation.  Patients presenting with no risk factors but with morbid ruminations; may be classified as minimal risk based on the severity of the depressive symptoms   Follow-up Information     [facility] Follow up.   Why: Follow up with your ACTT Team. Contact information: [address] [place] [address] [phone]         [facility] Call.   Specialty: Addiction Medicine Why: A referral has been made to this provider for residential substance use treatment. Please call this provider daily to see if there is an available bed. Contact information: [address] [place] [address] [phone]         [facility] Call.   Why: A referral has been made to this provider for residential substance use treatment.  Please call daily to see if there is an available bed. Contact information: [address] [place] [address] [phone]  Plan Of Care/Follow-up  recommendations:  Activity:  ad lib  Antonieta Pert, MD 04/06/2021, 10:56 AM

## 2021-04-06 NOTE — Plan of Care (Signed)
Discharge note  Patient verbalizes readiness for discharge. Follow up plan explained, AVS, Transition record and SRA given. Prescriptions and teaching provided. Belongings returned and signed for. Suicide safety plan completed and signed. Patient verbalizes understanding. Patient denies SI/HI and assures this Clinical research associate they will seek assistance should that change. Patient discharged to lobby to wait for safe transport.  Problem: Education: Goal: Utilization of techniques to improve thought processes will improve Outcome: Adequate for Discharge Goal: Knowledge of the prescribed therapeutic regimen will improve Outcome: Adequate for Discharge   Problem: Activity: Goal: Interest or engagement in leisure activities will improve Outcome: Adequate for Discharge Goal: Imbalance in normal sleep/wake cycle will improve Outcome: Adequate for Discharge   Problem: Coping: Goal: Coping ability will improve Outcome: Adequate for Discharge Goal: Will verbalize feelings Outcome: Adequate for Discharge   Problem: Health Behavior/Discharge Planning: Goal: Ability to make decisions will improve Outcome: Adequate for Discharge Goal: Compliance with therapeutic regimen will improve Outcome: Adequate for Discharge   Problem: Role Relationship: Goal: Will demonstrate positive changes in social behaviors and relationships Outcome: Adequate for Discharge   Problem: Safety: Goal: Ability to disclose and discuss suicidal ideas will improve Outcome: Adequate for Discharge Goal: Ability to identify and utilize support systems that promote safety will improve Outcome: Adequate for Discharge   Problem: Self-Concept: Goal: Will verbalize positive feelings about self Outcome: Adequate for Discharge Goal: Level of anxiety will decrease Outcome: Adequate for Discharge   Problem: Education: Goal: Ability to make informed decisions regarding treatment will improve 04/06/2021 1120 by Raylene Miyamoto,  RN Outcome: Adequate for Discharge 04/06/2021 0850 by Raylene Miyamoto, RN Outcome: Progressing   Problem: Coping: Goal: Coping ability will improve 04/06/2021 1120 by Raylene Miyamoto, RN Outcome: Adequate for Discharge 04/06/2021 0850 by Raylene Miyamoto, RN Outcome: Progressing   Problem: Health Behavior/Discharge Planning: Goal: Identification of resources available to assist in meeting health care needs will improve 04/06/2021 1120 by Raylene Miyamoto, RN Outcome: Adequate for Discharge 04/06/2021 0850 by Raylene Miyamoto, RN Outcome: Progressing   Problem: Medication: Goal: Compliance with prescribed medication regimen will improve Outcome: Adequate for Discharge   Problem: Self-Concept: Goal: Ability to disclose and discuss suicidal ideas will improve Outcome: Adequate for Discharge Goal: Will verbalize positive feelings about self Outcome: Adequate for Discharge   Problem: Education: Goal: Knowledge of disease or condition will improve Outcome: Adequate for Discharge Goal: Understanding of discharge needs will improve Outcome: Adequate for Discharge   Problem: Health Behavior/Discharge Planning: Goal: Ability to identify changes in lifestyle to reduce recurrence of condition will improve Outcome: Adequate for Discharge Goal: Identification of resources available to assist in meeting health care needs will improve Outcome: Adequate for Discharge   Problem: Physical Regulation: Goal: Complications related to the disease process, condition or treatment will be avoided or minimized Outcome: Adequate for Discharge   Problem: Safety: Goal: Ability to remain free from injury will improve Outcome: Adequate for Discharge   Problem: Education: Goal: Knowledge of [facility] Education information/materials will improve Outcome: Adequate for Discharge Goal: Emotional status will improve Outcome: Adequate for Discharge Goal: Mental status will  improve Outcome: Adequate for Discharge Goal: Verbalization of understanding the information provided will improve Outcome: Adequate for Discharge   Problem: Activity: Goal: Interest or engagement in activities will improve Outcome: Adequate for Discharge Goal: Sleeping patterns will improve Outcome: Adequate for Discharge   Problem: Coping: Goal: Ability to verbalize frustrations and anger appropriately will improve Outcome: Adequate for Discharge Goal: Ability  to demonstrate self-control will improve Outcome: Adequate for Discharge   Problem: Health Behavior/Discharge Planning: Goal: Identification of resources available to assist in meeting health care needs will improve Outcome: Adequate for Discharge Goal: Compliance with treatment plan for underlying cause of condition will improve Outcome: Adequate for Discharge   Problem: Physical Regulation: Goal: Ability to maintain clinical measurements within normal limits will improve Outcome: Adequate for Discharge   Problem: Safety: Goal: Periods of time without injury will increase Outcome: Adequate for Discharge

## 2021-04-06 NOTE — Discharge Summary (Signed)
Physician Discharge Summary Note  Patient:  Cory Floyd is an 45 y.o., male MRN:  [id number] DOB:  August 15, 1976 Patient phone:  [phone] (home)  Patient address:   [address] [place] [id number],   Total Time spent with patient:  Greater than 30 minutes  Date of Admission:  04/02/2021  Date of Discharge: 04-06-21  Reason for Admission: Worsening depression triggering suicidal ideations with a plan to shoot himself with a gun.  Principal Problem: Cocaine abuse with cocaine-induced mood disorder (HCC)  Discharge Diagnoses: Principal Problem:   Cocaine abuse with cocaine-induced mood disorder (HCC) Active Problems:   Bipolar 1 disorder, depressed, severe (HCC)  Past Psychiatric History: Bipolar 1 disorder  Past Medical History:  Past Medical History:  Diagnosis Date   Alcoholic (HCC)    clean for 3 years   Anginal pain (HCC)    Arthritis    Bipolar 1 disorder (HCC)    COVID-19    Depressed    Diabetes mellitus without complication (HCC)    Dyspnea    Fatty liver    Hypertension    Schizophrenia (HCC)     Past Surgical History:  Procedure Laterality Date   COLONOSCOPY  02/05/2014   diverticulosis, hyperplastic polyp x 2   fracture arm Right    KNEE SURGERY Right 2013   NASAL SEPTUM SURGERY     NASAL TURBINATE REDUCTION Bilateral 12/12/2016   Procedure: TURBINATE REDUCTION/SUBMUCOSAL RESECTION;  Surgeon: Linus Salmonshapman [name], MD;  Location: [facility] ORS;  Service: ENT;  Laterality: Bilateral;   Family History:  Family History  Problem Relation Age of Onset   Hepatitis C Mother    Cancer Mother    Cancer Maternal Aunt    Hypertension Maternal Grandmother    Family Psychiatric  History: See H&P  Social History:  Social History   Substance and Sexual Activity  Alcohol Use Yes   Comment: 1/2-5th pint of liquor     Social History   Substance and Sexual Activity  Drug Use Yes   Types: Cocaine, Marijuana   Comment: heroin    Social History    Socioeconomic History   Marital status: Divorced    Spouse name: Not on file   Number of children: Not on file   Years of education: Not on file   Highest education level: Not on file  Occupational History   Not on file  Tobacco Use   Smoking status: Every Day    Packs/day: 1.00    Years: 20.00    Pack years: 20.00    Types: Cigarettes   Smokeless tobacco: Never  Vaping Use   Vaping Use: Never used  Substance and Sexual Activity   Alcohol use: Yes    Comment: 1/2-5th pint of liquor   Drug use: Yes    Types: Cocaine, Marijuana    Comment: heroin   Sexual activity: Yes    Birth control/protection: None  Other Topics Concern   Not on file  Social History Narrative   Not on file   Social Determinants of Health   Financial Resource Strain: Not on file  Food Insecurity: Not on file  Transportation Needs: Not on file  Physical Activity: Not on file  Stress: Not on file  Social Connections: Not on file   Hospital Course: (Per Md's admission evaluation notes): Cory Floyd is a 45 y.o. male with history of psychiatric and substance abuse problems for years. States he has been depressed and suicidal with plan to use a gun. He states  he has access to a gun at his house, but also states he is homeless so it isn't clear what his living situation is. He has had several previous ED visits for same. States his ACT team worker came to check on him today and brought him in for evaluation. This represents one of many inpatient hospitalizations for this patient, last [facility] admission was in April 2022. He state he is suicidal with a plan to cut his wrists or shoot himself. He stated he has access to a gun at his house but he is also homeless. He believes he has been evicted form his apartment. He denies HI/AVH, paranoia and delusions. He did tell the [facility] assessment counselor that if he had access to his ex-girlfriend he might hurt her. He has an Investment banker, operational with [facility] and stated he  has not taken his medication 2 months because they keep sending them to [facility]. He stated "I think they get a kick back for sending people's medications to the wrong place."  He stated his substance abuse is his primary stressor and he wants to go to a long term facility for help. Patient is irritable and unwilling to answer questions today. He stated "Doesn't anyone read the paperwork? I have said the same thing 4 times already." We will restart Depakote, Abilify and start Remeron for depression tonight. Will make medication adjustments as needed for symptom management. Patient contracts for safety on the unit. Will continue to monitor and provide support.   Prior to this discharge, Cory Floyd was seen & evaluated for mental health stability. The current laboratory findings were reviewed (stable), nurses notes & vital signs were reviewed as well. There are no current mental health or medical issues that should prevent this discharge at this time. Patient is being discharged to continue mental health care as noted below.   This is one of several psychiatric admissions/discharge summaries in the [facility] for this 45 year old male with hx of chronic mental illness, substance use disorders & multiple psychiatric admissions. He is known in the [facility] health systems including the ED for worsening symptoms of Bipolar disorder/substance addiction issues. Cory Floyd has been tried on multiple psychotropic medications for his symptoms & it appears Cory Floyd may not have been compliant with his medication regimen plus the issues with homeless & substance use has also been a factor in his frequent & numerous hospitalizations. He was brought to the [facility] this time around for evaluation & treatment for worsening depression triggering suicidal ideations with a plan to shoot himself with a gun.  After evaluation of his presenting symptoms, Cory Floyd was recommended for mood stabilization treatments. The medication regimen for  his presenting symptoms were discussed & with his consent initiated. He received, stabilized & was discharged on the medications as listed below on his discharge medication lists. He was also enrolled & participated in the group counseling sessions being offered & held on this unit. He learned coping skills. He presented on this admission, no other chronic medical conditions that required treatment & monitoring. He tolerated his treatment regimen without any adverse effects or reactions reported. Kasheem's symptoms responded well to his treatment regimen warranting this discharge.  During the course of this hospitalization, the 15-minute checks were adequate to ensure Ernie's safety.  Patient did not display any dangerous, violent or suicidal behavior on the unit. He interacted with patients & staff appropriately. He participated appropriately in the group sessions/therapies. His medications were addressed & adjusted to  meet his needs. He was recommended for outpatient follow-up care & medication management upon discharge to assure his continuity of care.  At the time of discharge, patient is not reporting any acute suicidal/homicidal ideations. He currently denies any new issues or concerns. Education and supportive counseling provided throughout her hospital stay & upon discharge.  Today upon his discharge evaluation with the attending psychiatrist, Dammon shares he is doing well. He denies any other specific concerns. He is sleeping well. His appetite is good. He denies other physical complaints. He denies AH/VH, delusional thoughts or paranoia. He feels that his medications have been helpful & is in agreement to continue his current treatment regimen as recommended. He was able to engage in safety planning including plan to return to [facility] or contact emergency services if he feels unable to maintain his own safety or the safety of others. Pt had no further questions, comments, or concerns. He left [facility] with all  personal belongings in no apparent distress. Transportation per the [employer].  Physical Findings: AIMS: Facial and Oral Movements Muscles of Facial Expression: None, normal Lips and Perioral Area: None, normal Jaw: None, normal Tongue: None, normal,Extremity Movements Upper (arms, wrists, hands, fingers): None, normal Lower (legs, knees, ankles, toes): None, normal, Trunk Movements Neck, shoulders, hips: None, normal, Overall Severity Severity of abnormal movements (highest score from questions above): None, normal Incapacitation due to abnormal movements: None, normal Patient's awareness of abnormal movements (rate only patient's report): No Awareness, Dental Status Current problems with teeth and/or dentures?: No Does patient usually wear dentures?: No  CIWA:    COWS:     Musculoskeletal: Strength & Muscle Tone: within normal limits Gait & Station: normal Patient leans: N/A  Psychiatric Specialty Exam:  Presentation  General Appearance: Fairly groomed.  Eye Contact:Fair  Speech:Normal Rate  Speech Volume:Decreased  Handedness:Right  Mood and Affect  Mood:Euthymic  Affect:Congruent  Thought Process  Thought Processes:Coherent  Descriptions of Associations: Intact  Orientation:Full (Time, Place and Person)  Thought Content:Logical  History of Schizophrenia/Schizoaffective disorder:No  Duration of Psychotic Symptoms:Less than six months  Hallucinations:No data recorded  Ideas of Reference:None  Suicidal Thoughts:Suicidal Thoughts: No  Homicidal Thoughts:Homicidal Thoughts: No  Sensorium  Memory:Immediate;Good  Recent Good; Remote: Good  Judgment: Good  Insight: Fair  Executive Functions  Concentration:Good  Attention Span:Good  Recall:Fair  Fund of Knowledge:Fair  Language:Good  Psychomotor Activity  Psychomotor Activity:No data recorded  Assets  Assets:Desire for Improvement; Resilience  Sleep  Sleep:Sleep:  Good  Physical Exam: Physical Exam Vitals and nursing note reviewed.  HENT:     Head: Normocephalic.     Nose: Nose normal.     Mouth/Throat:     Pharynx: Oropharynx is clear.  Eyes:     Pupils: Pupils are equal, round, and reactive to light.  Cardiovascular:     Rate and Rhythm: Normal rate.     Pulses: Normal pulses.  Pulmonary:     Effort: Pulmonary effort is normal.  Genitourinary:    Comments: Deferred Musculoskeletal:        General: Normal range of motion.     Cervical back: Normal range of motion.  Skin:    General: Skin is warm and dry.  Neurological:     General: No focal deficit present.     Mental Status: He is alert and oriented to person, place, and time. Mental status is at baseline.   Review of Systems  Constitutional:  Negative for chills, diaphoresis and fever.  HENT:  Negative  for congestion and sore throat.   Eyes:  Negative for blurred vision.  Respiratory:  Negative for cough, shortness of breath and wheezing.   Cardiovascular:  Negative for chest pain and palpitations.  Gastrointestinal:  Negative for abdominal pain, constipation, diarrhea, heartburn, nausea and vomiting.  Genitourinary:  Negative for dysuria.  Musculoskeletal:  Negative for joint pain and myalgias.  Skin: Negative.   Neurological:  Negative for dizziness, tingling, tremors, sensory change, speech change, focal weakness, seizures, loss of consciousness, weakness and headaches.  Endo/Heme/Allergies:        Allergies: PCN   Food allergy: Carrots  Psychiatric/Behavioral:  Positive for substance abuse (Hx. Cocaine use disorder). Negative for depression, hallucinations, memory loss and suicidal ideas. The patient is not nervous/anxious and does not have insomnia.   Blood pressure 109/71, pulse 80, temperature 97.9 F (36.6 C), temperature source Oral, resp. rate 18, SpO2 100 %. There is no height or weight on file to calculate BMI.     Has this patient used any form of tobacco in the  last 30 days? (Cigarettes, Smokeless Tobacco, Cigars, and/or Pipes): N/A  Blood Alcohol level:  Lab Results  Component Value Date   ETH <10 04/01/2021   ETH <10 03/04/2021   Metabolic Disorder Labs:  Lab Results  Component Value Date   HGBA1C 5.5 04/02/2021   MPG 111 04/02/2021   MPG 119.76 01/23/2021   No results found for: PROLACTIN Lab Results  Component Value Date   CHOL 168 04/02/2021   TRIG 131 04/02/2021   HDL 61 04/02/2021   CHOLHDL 2.8 04/02/2021   VLDL 26 04/02/2021   LDLCALC 81 04/02/2021   LDLCALC 105 (H) 01/23/2021   See Psychiatric Specialty Exam and Suicide Risk Assessment completed by Attending Physician prior to discharge.  Discharge destination:  Home  Is patient on multiple antipsychotic therapies at discharge:  No   Has Patient had three or more failed trials of antipsychotic monotherapy by history:  No  Recommended Plan for Multiple Antipsychotic Therapies: NA   Allergies as of 04/06/2021       Reactions   Carrot [daucus Carota] Anaphylaxis, Rash   Penicillins Anaphylaxis, Hives   Has patient had a PCN reaction causing immediate rash, facial/tongue/throat swelling, SOB or lightheadedness with hypotension: Yes Has patient had a PCN reaction causing severe rash involving mucus membranes or skin necrosis: No Has patient had a PCN reaction that required hospitalization Yes Has patient had a PCN reaction occurring within the last 10 years: No If all of the above answers are "NO", then may proceed with Cephalosporin use.        Medication List     STOP taking these medications    acetaminophen 500 MG tablet Commonly known as: TYLENOL       TAKE these medications      Indication  ARIPiprazole 10 MG tablet Commonly known as: ABILIFY Take 1 tablet (10 mg total) by mouth daily. For mood control What changed:  medication strength how much to take additional instructions  Indication: MIXED BIPOLAR AFFECTIVE DISORDER   divalproex 500 MG  24 hr tablet Commonly known as: DEPAKOTE ER Take 2 tablets (1,000 mg total) by mouth at bedtime. For mood stabilization What changed: additional instructions  Indication: Mood stabilization   hydrOXYzine 25 MG tablet Commonly known as: ATARAX/VISTARIL Take 1 tablet (25 mg total) by mouth 3 (three) times daily as needed for anxiety.  Indication: Feeling Anxious   mirtazapine 15 MG tablet Commonly known as: REMERON Take 1  tablet (15 mg total) by mouth at bedtime. For depression/sleep  Indication: Major Depressive Disorder, Insomnia   traZODone 100 MG tablet Commonly known as: DESYREL Take 1 tablet (100 mg total) by mouth at bedtime as needed for sleep.  Indication: Trouble Sleeping        Follow-up Information     [facility] Follow up.   Why: Follow up with your ACTT Team. Contact information: [address] [place] [address] [phone]         [facility] Follow up.   Specialty: Addiction Medicine Why: A referral has been made to this provider for residential substance use treatment Contact information: [address] [place] [address] [phone]         [facility] Follow up.   Why: A referral has been made to this provider for residential substance use treatment. Contact information: [address] [place] [address] [phone]                Follow-up recommendations:  Activity:  As tolerated Diet: As recommended by your primary care doctor. Keep all scheduled follow-up appointments as recommended.  Comments: Prescriptions given at discharge.  Patient agreeable to plan.  Given opportunity to ask questions.  Appears to feel comfortable with discharge denies any current suicidal or homicidal thought. Patient is also instructed prior to discharge to: Take all medications as prescribed by his/her mental healthcare provider. Report any adverse  effects and or reactions from the medicines to his/her outpatient provider promptly. Patient has been instructed & cautioned: To not engage in alcohol and or illegal drug use while on prescription medicines. In the event of worsening symptoms, patient is instructed to call the crisis hotline, 911 and or go to the nearest ED for appropriate evaluation and treatment of symptoms. To follow-up with his/her primary care provider for your other medical issues, concerns and or health care needs.  Signed: Armandina Stammer, NP, PMHNP, FNP-BC 04/06/2021, 10:00 AM

## 2021-04-06 NOTE — Plan of Care (Signed)
°  Problem: Education: °Goal: Ability to make informed decisions regarding treatment will improve °Outcome: Progressing °  °Problem: Coping: °Goal: Coping ability will improve °Outcome: Progressing °  °Problem: Health Behavior/Discharge Planning: °Goal: Identification of resources available to assist in meeting health care needs will improve °Outcome: Progressing °  °

## 2021-05-01 ENCOUNTER — Other Ambulatory Visit: Payer: Self-pay

## 2021-05-01 ENCOUNTER — Emergency Department
Admission: EM | Admit: 2021-05-01 | Discharge: 2021-05-02 | Disposition: A | Discharge status: Other Acute Inpatient Hospital | Payer: Medicare (Managed Care) | Attending: Emergency Medicine | Admitting: Emergency Medicine

## 2021-05-01 DIAGNOSIS — Z20822 Contact with and (suspected) exposure to covid-19: Secondary | ICD-10-CM | POA: Insufficient documentation

## 2021-05-01 DIAGNOSIS — F3132 Bipolar disorder, current episode depressed, moderate: Secondary | ICD-10-CM | POA: Diagnosis present

## 2021-05-01 DIAGNOSIS — F17210 Nicotine dependence, cigarettes, uncomplicated: Secondary | ICD-10-CM | POA: Diagnosis not present

## 2021-05-01 DIAGNOSIS — F314 Bipolar disorder, current episode depressed, severe, without psychotic features: Secondary | ICD-10-CM | POA: Diagnosis not present

## 2021-05-01 DIAGNOSIS — Z8616 Personal history of COVID-19: Secondary | ICD-10-CM | POA: Insufficient documentation

## 2021-05-01 DIAGNOSIS — M25561 Pain in right knee: Secondary | ICD-10-CM | POA: Diagnosis not present

## 2021-05-01 DIAGNOSIS — R45851 Suicidal ideations: Secondary | ICD-10-CM | POA: Diagnosis not present

## 2021-05-01 DIAGNOSIS — F1510 Other stimulant abuse, uncomplicated: Secondary | ICD-10-CM

## 2021-05-01 LAB — CBC
HCT: 37.6 % — ABNORMAL LOW (ref 39.0–52.0)
Hemoglobin: 12.6 g/dL — ABNORMAL LOW (ref 13.0–17.0)
MCH: 30.3 pg (ref 26.0–34.0)
MCHC: 33.5 g/dL (ref 30.0–36.0)
MCV: 90.4 fL (ref 80.0–100.0)
Platelets: 180 10*3/uL (ref 150–400)
RBC: 4.16 MIL/uL — ABNORMAL LOW (ref 4.22–5.81)
RDW: 12.8 % (ref 11.5–15.5)
WBC: 6.6 10*3/uL (ref 4.0–10.5)
nRBC: 0 % (ref 0.0–0.2)

## 2021-05-01 LAB — COMPREHENSIVE METABOLIC PANEL
ALT: 29 U/L (ref 0–44)
AST: 33 U/L (ref 15–41)
Albumin: 4.1 g/dL (ref 3.5–5.0)
Alkaline Phosphatase: 64 U/L (ref 38–126)
Anion gap: 3 — ABNORMAL LOW (ref 5–15)
BUN: 20 mg/dL (ref 6–20)
CO2: 27 mmol/L (ref 22–32)
Calcium: 9 mg/dL (ref 8.9–10.3)
Chloride: 110 mmol/L (ref 98–111)
Creatinine, Ser: 0.84 mg/dL (ref 0.61–1.24)
GFR, Estimated: >60 mL/min (ref >60)
Glucose, Bld: 99 mg/dL (ref 70–99)
Potassium: 3.6 mmol/L (ref 3.5–5.1)
Sodium: 140 mmol/L (ref 135–145)
Total Bilirubin: 0.5 mg/dL (ref 0.3–1.2)
Total Protein: 7.3 g/dL (ref 6.5–8.1)

## 2021-05-01 LAB — ACETAMINOPHEN LEVEL: Acetaminophen (Tylenol), Serum: <10 ug/mL — ABNORMAL LOW (ref 10–30)

## 2021-05-01 LAB — RESP PANEL BY RT-PCR (FLU A&B, COVID) ARPGX2
Influenza A by PCR: NEGATIVE
Influenza B by PCR: NEGATIVE
SARS Coronavirus 2 by RT PCR: NEGATIVE

## 2021-05-01 LAB — VALPROIC ACID LEVEL: Valproic Acid Lvl: <10 ug/mL — ABNORMAL LOW (ref 50.0–100.0)

## 2021-05-01 LAB — ETHANOL: Alcohol, Ethyl (B): <10 mg/dL (ref <10)

## 2021-05-01 LAB — SALICYLATE LEVEL: Salicylate Lvl: <7 mg/dL — ABNORMAL LOW (ref 7.0–30.0)

## 2021-05-01 MED ORDER — DIVALPROEX SODIUM ER 500 MG PO TB24
1000.0000 mg | ORAL_TABLET | Freq: Every day | ORAL | Status: DC
  Administered 2021-05-01: 1000 mg via ORAL
  Filled 2021-05-01: qty 4

## 2021-05-01 MED ORDER — IBUPROFEN 800 MG PO TABS
800.0000 mg | ORAL_TABLET | Freq: Four times a day (QID) | ORAL | Status: DC | PRN
  Administered 2021-05-01 – 2021-05-02 (×5): 800 mg via ORAL
  Filled 2021-05-01 (×5): qty 1

## 2021-05-01 MED ORDER — TRAZODONE HCL 100 MG PO TABS: 100.0000 mg | ORAL_TABLET | Freq: Every evening | ORAL | Status: DC | PRN

## 2021-05-01 MED ORDER — MIRTAZAPINE 15 MG PO TABS
15.0000 mg | ORAL_TABLET | Freq: Every day | ORAL | Status: DC
  Administered 2021-05-01: 15 mg via ORAL
  Filled 2021-05-01: qty 1

## 2021-05-01 MED ORDER — ARIPIPRAZOLE 10 MG PO TABS
10.0000 mg | ORAL_TABLET | Freq: Every day | ORAL | Status: DC
  Administered 2021-05-01 – 2021-05-02 (×2): 10 mg via ORAL
  Filled 2021-05-01 (×2): qty 1

## 2021-05-01 MED ORDER — HYDROXYZINE HCL 25 MG PO TABS: 25.0000 mg | ORAL_TABLET | Freq: Three times a day (TID) | ORAL | Status: DC | PRN

## 2021-05-01 NOTE — Consult Note (Signed)
[facility] Face-to-Face Psychiatry Consult   Reason for Consult:  Psychiatric Evaluation Referring Physician:  Dr. Elesa Massed Patient Identification: Cory Floyd MRN:  [id number] Principal Diagnosis: Suicidal ideation Diagnosis:  Principal Problem:   Suicidal ideation Active Problems:   Bipolar affective disorder, depressed, moderate (HCC)   Total Time spent with patient: 45 minutes   HPI: Cory Floyd, 45 y.o., male patient presented to [facility] after walking from [facility] at the recommendation of his ACTT team.  Patient seen by TTS and this provider; chart reviewed and consulted with Dr. Elesa Massed on 05/01/21.  On evaluation MEYER Floyd reports per TTS, pt stated "My ACT Team told me to come to the hospital. Pt spoke with clear and coherent speech. Pt's thoughts were intact and linear. Pt ruminated about walking from [place] to [place] to visit his mother's grave site. Pt explained that he'd brought a plot next to hers. Pt had poor eye contact and his motor activity was unremarkable. Pt had a depressed mood and his affect was congruent. Pt had impaired insight and judgement. Pt did not appear to be responding to internal/external stimuli. Pt was adamant that he had not used any substances. Pt emphasized his discontentment with his current ACT Team and living situation in [place]. Pt stated that he has not been med compliant because his ACT Teams does not manage his orders. The pt reported having a strained relationship with his daughters stating, "I have no family". When pt was asked if he had a suicidal plan pt reported that he planned to take a bunch of needles with drugs and fall asleep. The pt expressed that the goal would be to be with his deceased mother. Pt reported that he's been feeling this way for months. Pt reported a decreased appetite and significant weight loss. Pt also reported not having any sleep. The patient denied current HI or AV/H.   Recommendations:  Pt meets psychiatric  inpatient criteria for medication management.     Past Psychiatric History: Bipolar disorder  Risk to Self:   Risk to Others:   Prior Inpatient Therapy:   Prior Outpatient Therapy:    Past Medical History:  Past Medical History:  Diagnosis Date   Alcoholic (HCC)    clean for 3 years   Anginal pain (HCC)    Arthritis    Bipolar 1 disorder (HCC)    COVID-19    Depressed    Diabetes mellitus without complication (HCC)    Dyspnea    Fatty liver    Hypertension    Schizophrenia (HCC)     Past Surgical History:  Procedure Laterality Date   COLONOSCOPY  02/05/2014   diverticulosis, hyperplastic polyp x 2   fracture arm Right    KNEE SURGERY Right 2013   NASAL SEPTUM SURGERY     NASAL TURBINATE REDUCTION Bilateral 12/12/2016   Procedure: TURBINATE REDUCTION/SUBMUCOSAL RESECTION;  Surgeon: Linus Salmons, MD;  Location: [facility] ORS;  Service: ENT;  Laterality: Bilateral;   Family History:  Family History  Problem Relation Age of Onset   Hepatitis C Mother    Cancer Mother    Cancer Maternal Aunt    Hypertension Maternal Grandmother    Family Psychiatric  History: unknwn Social History:  Social History   Substance and Sexual Activity  Alcohol Use Yes   Comment: 1/2-5th pint of liquor     Social History   Substance and Sexual Activity  Drug Use Yes   Types: Cocaine, Marijuana   Comment: heroin  Social History   Socioeconomic History   Marital status: Divorced    Spouse name: Not on file   Number of children: Not on file   Years of education: Not on file   Highest education level: Not on file  Occupational History   Not on file  Tobacco Use   Smoking status: Every Day    Packs/day: 1.00    Years: 20.00    Pack years: 20.00    Types: Cigarettes   Smokeless tobacco: Never  Vaping Use   Vaping Use: Never used  Substance and Sexual Activity   Alcohol use: Yes    Comment: 1/2-5th pint of liquor   Drug use: Yes    Types: Cocaine, Marijuana    Comment:  heroin   Sexual activity: Yes    Birth control/protection: None  Other Topics Concern   Not on file  Social History Narrative   Not on file   Social Determinants of Health   Financial Resource Strain: Not on file  Food Insecurity: Not on file  Transportation Needs: Not on file  Physical Activity: Not on file  Stress: Not on file  Social Connections: Not on file   Additional Social History:    Allergies:   Allergies  Allergen Reactions   Carrot [Daucus Carota] Anaphylaxis and Rash   Penicillins Anaphylaxis and Hives    Has patient had a PCN reaction causing immediate rash, facial/tongue/throat swelling, SOB or lightheadedness with hypotension: Yes Has patient had a PCN reaction causing severe rash involving mucus membranes or skin necrosis: No Has patient had a PCN reaction that required hospitalization Yes Has patient had a PCN reaction occurring within the last 10 years: No If all of the above answers are "NO", then may proceed with Cephalosporin use.     Labs:  Results for orders placed or performed during the hospital encounter of 05/01/21 (from the past 48 hour(s))  Comprehensive metabolic panel     Status: Abnormal   Collection Time: 05/01/21 12:49 AM  Result Value Ref Range   Sodium 140 135 - 145 mmol/L   Potassium 3.6 3.5 - 5.1 mmol/L   Chloride 110 98 - 111 mmol/L   CO2 27 22 - 32 mmol/L   Glucose, Bld 99 70 - 99 mg/dL    Comment: Glucose reference range applies only to samples taken after fasting for at least 8 hours.   BUN 20 6 - 20 mg/dL   Creatinine, Ser 8.34 0.61 - 1.24 mg/dL   Calcium 9.0 8.9 - 19.6 mg/dL   Total Protein 7.3 6.5 - 8.1 g/dL   Albumin 4.1 3.5 - 5.0 g/dL   AST 33 15 - 41 U/L   ALT 29 0 - 44 U/L   Alkaline Phosphatase 64 38 - 126 U/L   Total Bilirubin 0.5 0.3 - 1.2 mg/dL   GFR, Estimated >22 >29 mL/min    Comment: (NOTE) Calculated using the CKD-EPI Creatinine Equation (2021)    Anion gap 3 (L) 5 - 15    Comment: Performed at  [facility], [address] [place], [place] [address]  Ethanol     Status: None   Collection Time: 05/01/21 12:49 AM  Result Value Ref Range   Alcohol, Ethyl (B) <10 <10 mg/dL    Comment: (NOTE) Lowest detectable limit for serum alcohol is 10 mg/dL.  For medical purposes only. Performed at [facility], [address] [place], [place] [address]   Salicylate level     Status: Abnormal  Collection Time: 05/01/21 12:49 AM  Result Value Ref Range   Salicylate Lvl <7.0 (L) 7.0 - 30.0 mg/dL    Comment: Performed at [facility], [address] [place], [place] [address]  Acetaminophen level     Status: Abnormal   Collection Time: 05/01/21 12:49 AM  Result Value Ref Range   Acetaminophen (Tylenol), Serum <10 (L) 10 - 30 ug/mL    Comment: (NOTE) Therapeutic concentrations vary significantly. A range of 10-30 ug/mL  may be an effective concentration for many patients. However, some  are best treated at concentrations outside of this range. Acetaminophen concentrations >150 ug/mL at 4 hours after ingestion  and >50 ug/mL at 12 hours after ingestion are often associated with  toxic reactions.  Performed at [facility], [address] [place], [place] [address]   cbc     Status: Abnormal   Collection Time: 05/01/21 12:49 AM  Result Value Ref Range   WBC 6.6 4.0 - 10.5 K/uL   RBC 4.16 (L) 4.22 - 5.81 MIL/uL   Hemoglobin 12.6 (L) 13.0 - 17.0 g/dL   HCT 29.537.6 (L) 62.139.0 - 30.852.0 %   MCV 90.4 80.0 - 100.0 fL   MCH 30.3 26.0 - 34.0 pg   MCHC 33.5 30.0 - 36.0 g/dL   RDW 65.712.8 84.611.5 - 96.215.5 %   Platelets 180 150 - 400 K/uL   nRBC 0.0 0.0 - 0.2 %    Comment: Performed at [facility], [address] [place], [place] [address]  Valproic acid level     Status: Abnormal   Collection Time: 05/01/21 12:49 AM  Result Value Ref Range   Valproic Acid Lvl <10 (L) 50.0 - 100.0 ug/mL    Comment: RESULTS CONFIRMED BY MANUAL DILUTION SS  DLB Performed at [facility], [address] [place], [place] [address]   Resp Panel by RT-PCR (Flu A&B, Covid) Nasopharyngeal Swab     Status: None   Collection Time: 05/01/21  2:12 AM   Specimen: Nasopharyngeal Swab; Nasopharyngeal(NP) swabs in vial transport medium  Result Value Ref Range   SARS Coronavirus 2 by RT PCR NEGATIVE NEGATIVE    Comment: (NOTE) SARS-CoV-2 target nucleic acids are NOT DETECTED.  The SARS-CoV-2 RNA is generally detectable in upper respiratory specimens during the acute phase of infection. The lowest concentration of SARS-CoV-2 viral copies this assay can detect is 138 copies/mL. A negative result does not preclude SARS-Cov-2 infection and should not be used as the sole basis for treatment or other patient management decisions. A negative result may occur with  improper specimen collection/handling, submission of specimen other than nasopharyngeal swab, presence of viral mutation(s) within the areas targeted by this assay, and inadequate number of viral copies(<138 copies/mL). A negative result must be combined with clinical observations, patient history, and epidemiological information. The expected result is Negative.  Fact Sheet for Patients:  BloggerCourse.comhttps://www.fda.gov/media/152166/download  Fact Sheet for Healthcare Providers:  SeriousBroker.ithttps://www.fda.gov/media/152162/download  This test is no t yet approved or cleared by the [place] FDA and  has been authorized for detection and/or diagnosis of SARS-CoV-2 by FDA under an Emergency Use Authorization (EUA). This EUA will remain  in effect (meaning this test can be used) for the duration of the COVID-19 declaration under Section 564(b)(1) of the Act, 21 U.S.C.section 360bbb-3(b)(1), unless the authorization is terminated  or revoked sooner.       Influenza A by PCR NEGATIVE NEGATIVE   Influenza B by PCR NEGATIVE NEGATIVE  Comment: (NOTE) The Xpert Xpress SARS-CoV-2/FLU/RSV plus assay is  intended as an aid in the diagnosis of influenza from Nasopharyngeal swab specimens and should not be used as a sole basis for treatment. Nasal washings and aspirates are unacceptable for Xpert Xpress SARS-CoV-2/FLU/RSV testing.  Fact Sheet for Patients: BloggerCourse.com  Fact Sheet for Healthcare Providers: SeriousBroker.it  This test is not yet approved or cleared by the [place] FDA and has been authorized for detection and/or diagnosis of SARS-CoV-2 by FDA under an Emergency Use Authorization (EUA). This EUA will remain in effect (meaning this test can be used) for the duration of the COVID-19 declaration under Section 564(b)(1) of the Act, 21 U.S.C. section 360bbb-3(b)(1), unless the authorization is terminated or revoked.  Performed at [facility], [address] [place], [place] [address]     Current Facility-Administered Medications  Medication Dose Route Frequency Provider Last Rate Last Admin   ARIPiprazole (ABILIFY) tablet 10 mg  10 mg Oral Daily Ward, Kristen N, DO       divalproex (DEPAKOTE ER) 24 hr tablet 1,000 mg  1,000 mg Oral QHS Ward, Kristen N, DO       hydrOXYzine (ATARAX/VISTARIL) tablet 25 mg  25 mg Oral TID PRN Ward, Kristen N, DO       ibuprofen (ADVIL) tablet 800 mg  800 mg Oral Q6H PRN Ward, Kristen N, DO   800 mg at 05/01/21 0250   mirtazapine (REMERON) tablet 15 mg  15 mg Oral QHS Ward, Kristen N, DO       traZODone (DESYREL) tablet 100 mg  100 mg Oral QHS PRN Ward, Layla Maw, DO       Current Outpatient Medications  Medication Sig Dispense Refill   ARIPiprazole (ABILIFY) 10 MG tablet Take 1 tablet (10 mg total) by mouth daily. For mood control 30 tablet 0   divalproex (DEPAKOTE ER) 500 MG 24 hr tablet Take 2 tablets (1,000 mg total) by mouth at bedtime. For mood stabilization 60 tablet 0   hydrOXYzine (ATARAX/VISTARIL) 25 MG tablet Take 1 tablet (25 mg total) by mouth 3 (three) times  daily as needed for anxiety. 75 tablet 0   mirtazapine (REMERON) 15 MG tablet Take 1 tablet (15 mg total) by mouth at bedtime. For depression/sleep 30 tablet 0   traZODone (DESYREL) 100 MG tablet Take 1 tablet (100 mg total) by mouth at bedtime as needed for sleep. 30 tablet 0    Musculoskeletal: Strength & Muscle Tone: within normal limits Gait & Station: unsteady Patient leans: N/A            Psychiatric Specialty Exam:  Presentation  General Appearance: Disheveled  Eye Contact:Fair  Speech:Clear and Coherent  Speech Volume:Decreased  Handedness:Right   Mood and Affect  Mood:Anxious; Depressed  Affect:Depressed   Thought Process  Thought Processes:Coherent  Descriptions of Associations:Intact  Orientation:Full (Time, Place and Person)  Thought Content:Logical  History of Schizophrenia/Schizoaffective disorder:No  Duration of Psychotic Symptoms:Less than six months  Hallucinations:Hallucinations: None  Ideas of Reference:None  Suicidal Thoughts:Suicidal Thoughts: Yes, Active SI Active Intent and/or Plan: With Plan  Homicidal Thoughts:Homicidal Thoughts: No   Sensorium  Memory:Immediate Fair  Judgment:Poor  Insight:Poor   Executive Functions  Concentration:Poor  Attention Span:Fair  Recall:Fair  Fund of Knowledge:Fair  Language:Fair   Psychomotor Activity  Psychomotor Activity:Psychomotor Activity: Normal   Assets  Assets:Communication Skills; Desire for Improvement; Social Support   Sleep  Sleep:Sleep: Fair   Physical Exam: Physical Exam Vitals and nursing note reviewed.  HENT:  Head: Normocephalic and atraumatic.     Nose: Nose normal.  Eyes:     Pupils: Pupils are equal, round, and reactive to light.  Pulmonary:     Effort: Pulmonary effort is normal.  Musculoskeletal:        General: Normal range of motion.     Cervical back: Normal range of motion.  Skin:    General: Skin is warm and dry.   Neurological:     General: No focal deficit present.     Mental Status: He is alert and oriented to person, place, and time.  Psychiatric:        Attention and Perception: Attention normal.        Mood and Affect: Mood is anxious and depressed.        Speech: Speech normal.        Behavior: Behavior normal. Behavior is cooperative.        Thought Content: Thought content includes suicidal ideation.        Cognition and Memory: Cognition and memory normal.        Judgment: Judgment is impulsive and inappropriate.   Review of Systems  Psychiatric/Behavioral:  Positive for depression and suicidal ideas.   All other systems reviewed and are negative. Blood pressure 125/72, pulse 99, temperature 97.7 F (36.5 C), temperature source Oral, resp. rate 18, height 5\' 10"  (1.778 m), weight 79.4 kg, SpO2 99 %. Body mass index is 25.11 kg/m.  Treatment Plan Summary: Plan:   1-Individual and group therapy 2-Medication management for Bipolar disorder:  Medications reviewed, no changes made 3-Continue crisis stabilization and management 4-Treatment plan in progress to prevent relapse of depression and anxiety  Disposition: Recommend psychiatric Inpatient admission when medically cleared. Supportive therapy provided about ongoing stressors. Discussed crisis plan, support from social network, calling 911, coming to the Emergency Department, and calling Suicide Hotline.  , NP 05/01/2021 5:42 AM

## 2021-05-01 NOTE — BH Assessment (Addendum)
Referral information for Psychiatric Hospitalization faxed to;   [facility] [phone]),   [facility] [phone]) [phone]  Earlene Plater [phone]),  [address] [id number]),   [facility] [phone]- [phone]),   [facility] [phone]).  [facility] [phone])

## 2021-05-01 NOTE — ED Provider Notes (Addendum)
[facility] Emergency Department Provider Note  ____________________________________________   Event Date/Time   First MD Initiated Contact with Patient 05/01/21 0138     (approximate)  I have reviewed the triage vital signs and the nursing notes.   HISTORY  Chief Complaint Suicidal    HPI Cory Floyd is a 45 y.o. male with history of schizophrenia, bipolar disorder, hypertension, alcohol and cocaine abuse who presents to the emergency department with suicidal thoughts.  States he plans to inject drugs to kill himself.  States he has been clean and sober for the past 2 weeks.  Reports compliance with his medications.  States that someone from his act team called him tonight and made him promise that he would come to be evaluated given he was suicidal.  States he [place] here from [place] so that he could overdose at his mother's grave site.  He is complaining of exacerbation of his chronic right knee pain.  Denies any injury to this area.  States it is hurting from walking so far.  He has blue discoloration over both knees.  States he was wearing blue jeans.        Past Medical History:  Diagnosis Date   Alcoholic (HCC)    clean for 3 years   Anginal pain (HCC)    Arthritis    Bipolar 1 disorder (HCC)    COVID-19    Depressed    Diabetes mellitus without complication (HCC)    Dyspnea    Fatty liver    Hypertension    Schizophrenia [facility])     Patient Active Problem List   Diagnosis Date Noted   Bipolar 1 disorder, depressed, severe (HCC) 04/02/2021   Cocaine abuse with cocaine-induced mood disorder (HCC) 04/02/2021   Bipolar disorder (HCC) 01/24/2021   Substance induced mood disorder (HCC) 07/26/2020   Suicidal ideation    Polysubstance dependence (HCC) 07/25/2020   Bipolar affective disorder, depressed, moderate (HCC) 03/06/2020   Arthritis 09/30/2019   Gastroesophageal reflux disease without esophagitis 09/21/2016   Obstructive sleep  apnea 09/21/2016   Morbid obesity with BMI of 40.0-44.9, adult (HCC) 08/15/2016   Stable angina pectoris (HCC) 08/15/2016    Past Surgical History:  Procedure Laterality Date   COLONOSCOPY  02/05/2014   diverticulosis, hyperplastic polyp x 2   fracture arm Right    KNEE SURGERY Right 2013   NASAL SEPTUM SURGERY     NASAL TURBINATE REDUCTION Bilateral 12/12/2016   Procedure: TURBINATE REDUCTION/SUBMUCOSAL RESECTION;  Surgeon: Linus Salmons, MD;  Location: [facility] ORS;  Service: ENT;  Laterality: Bilateral;    Prior to Admission medications   Medication Sig Start Date End Date Taking? Authorizing Provider  ARIPiprazole (ABILIFY) 10 MG tablet Take 1 tablet (10 mg total) by mouth daily. For mood control 04/06/21   Armandina Stammer I, NP  divalproex (DEPAKOTE ER) 500 MG 24 hr tablet Take 2 tablets (1,000 mg total) by mouth at bedtime. For mood stabilization 04/06/21   Armandina Stammer I, NP  hydrOXYzine (ATARAX/VISTARIL) 25 MG tablet Take 1 tablet (25 mg total) by mouth 3 (three) times daily as needed for anxiety. 04/06/21   Armandina Stammer I, NP  mirtazapine (REMERON) 15 MG tablet Take 1 tablet (15 mg total) by mouth at bedtime. For depression/sleep 04/06/21   Armandina Stammer I, NP  traZODone (DESYREL) 100 MG tablet Take 1 tablet (100 mg total) by mouth at bedtime as needed for sleep. 04/06/21   Armandina Stammer I, NP  fluticasone (FLONASE) 50 MCG/ACT  nasal spray Place 1 spray into both nostrils daily. Patient not taking: Reported on 01/23/2021 08/24/20 01/23/21  Petrucelli, Lelon Mast R, [name]    Allergies Carrot [daucus carota] and Penicillins  Family History  Problem Relation Age of Onset   Hepatitis C Mother    Cancer Mother    Cancer Maternal Aunt    Hypertension Maternal Grandmother     Social History Social History   Tobacco Use   Smoking status: Every Day    Packs/day: 1.00    Years: 20.00    Pack years: 20.00    Types: Cigarettes   Smokeless tobacco: Never  Vaping Use   Vaping Use: Never used   Substance Use Topics   Alcohol use: Yes    Comment: 1/2-5th pint of liquor   Drug use: Yes    Types: Cocaine, Marijuana    Comment: heroin    Review of Systems Constitutional: No fever. Eyes: No visual changes. ENT: No sore throat. Cardiovascular: Denies chest pain. Respiratory: Denies shortness of breath. Gastrointestinal: No nausea, vomiting, diarrhea. Genitourinary: Negative for dysuria. Musculoskeletal: Negative for back pain. Skin: Negative for rash. Neurological: Negative for focal weakness or numbness.  ____________________________________________   PHYSICAL EXAM:  VITAL SIGNS: ED Triage Vitals  Enc Vitals Group     BP 05/01/21 0038 125/72     Pulse Rate 05/01/21 0038 99     Resp 05/01/21 0038 18     Temp 05/01/21 0038 97.7 F (36.5 C)     Temp Source 05/01/21 0038 Oral     SpO2 05/01/21 0038 99 %     Weight 05/01/21 0133 175 lb (79.4 kg)     Height 05/01/21 0133 5\' 10"  (1.778 m)     Head Circumference --      Peak Flow --      Pain Score 05/01/21 0133 8     Pain Loc --      Pain Edu? --      Excl. in GC? --    CONSTITUTIONAL: Alert and oriented and responds appropriately to questions. Well-appearing; well-nourished HEAD: Normocephalic EYES: Conjunctivae clear, pupils appear equal, EOM appear intact ENT: normal nose; moist mucous membranes NECK: Supple, normal ROM CARD: RRR; S1 and S2 appreciated; no murmurs, no clicks, no rubs, no gallops RESP: Normal chest excursion without splinting or tachypnea; breath sounds clear and equal bilaterally; no wheezes, no rhonchi, no rales, no hypoxia or respiratory distress, speaking full sentences ABD/GI: Normal bowel sounds; non-distended; soft, non-tender, no rebound, no guarding, no peritoneal signs, no hepatosplenomegaly BACK: The back appears normal EXT: Normal ROM in all joints; no deformity noted, no edema; no cyanosis, patient reports tenderness over the right knee without deformity, he has old surgical scar  over the anterior knee, no redness or warmth, no calf tenderness or calf swelling, compartments are soft, 2+ DP pulses bilaterally, he has blue discoloration over the skin of both knees that comes off using an alcohol swab that is consistent with the dye from his blue jeans SKIN: Normal color for age and race; warm; no rash on exposed skin NEURO: Moves all extremities equally PSYCH: Reports SI.  No HI or hallucinations.  ____________________________________________   LABS (all labs ordered are listed, but only abnormal results are displayed)  Labs Reviewed  COMPREHENSIVE METABOLIC PANEL - Abnormal; Notable for the following components:      Result Value   Anion gap 3 (*)    All other components within normal limits  SALICYLATE LEVEL - Abnormal; Notable for  the following components:   Salicylate Lvl <7.0 (*)    All other components within normal limits  ACETAMINOPHEN LEVEL - Abnormal; Notable for the following components:   Acetaminophen (Tylenol), Serum <10 (*)    All other components within normal limits  CBC - Abnormal; Notable for the following components:   RBC 4.16 (*)    Hemoglobin 12.6 (*)    HCT 37.6 (*)    All other components within normal limits  RESP PANEL BY RT-PCR (FLU A&B, COVID) ARPGX2  ETHANOL  URINE DRUG SCREEN, QUALITATIVE ([facility] ONLY)  VALPROIC ACID LEVEL   ____________________________________________  EKG   ____________________________________________  RADIOLOGY I, Willette Mudry, personally viewed and evaluated these images (plain radiographs) as part of my medical decision making, as well as reviewing the written report by the radiologist.  ED MD interpretation:    Official radiology report(s): No results found.  ____________________________________________   PROCEDURES  Procedure(s) performed (including Critical Care):  Procedures    ____________________________________________   INITIAL IMPRESSION / ASSESSMENT AND PLAN / ED  COURSE  As part of my medical decision making, I reviewed the following data within the electronic MEDICAL RECORD NUMBER Nursing notes reviewed and incorporated, Labs reviewed , Old chart reviewed, A consult was requested and obtained from this/these consultant(s) Psychiatry, and Notes from prior ED visits         Patient here requesting psychiatric evaluation.  He is here voluntarily.  He states he has suicidal thoughts with plan to overdose.  Screening labs, urine pending.  Will consult TTS and psychiatry.  Also reports that he is right knee is hurting.  No sign of fracture, dislocation, gout, septic arthritis, cellulitis, abscess, compartment syndrome, arterial obstruction, DVT.  I do not feel he needs emergent imaging.  Will give ibuprofen.  He did have blue discoloration over both of his knees that was likely from his blue jeans as it came off with an alcohol swab.  ED PROGRESS  Patient screening labs are unremarkable.  Patient currently medically cleared.  Awaiting psychiatric evaluation for further disposition.  Patient is not under IVC at this time.  He is here voluntarily.   Psych recommends inpatient psychiatric treatment.  I reviewed all nursing notes and pertinent previous records as available.  I have reviewed and interpreted any EKGs, lab and urine results, imaging (as available).  ____________________________________________   FINAL CLINICAL IMPRESSION(S) / ED DIAGNOSES  Final diagnoses:  Suicidal ideation     ED Discharge Orders     None       *Please note:  Cory Floyd was evaluated in Emergency Department on 05/01/2021 for the symptoms described in the history of present illness. He was evaluated in the context of the global COVID-19 pandemic, which necessitated consideration that the patient might be at risk for infection with the SARS-CoV-2 virus that causes COVID-19. Institutional protocols and algorithms that pertain to the evaluation of patients at risk for  COVID-19 are in a state of rapid change based on information released by regulatory bodies including the CDC and federal and state organizations. These policies and algorithms were followed during the patient's care in the ED.  Some ED evaluations and interventions may be delayed as a result of limited staffing during and the pandemic.*   Note:  This document was prepared using Dragon voice recognition software and may include unintentional dictation errors.    Jonathan Kirkendoll, Layla Maw, DO 05/01/21 0502    Jhaniya Briski, Layla Maw, DO 05/01/21 0867

## 2021-05-01 NOTE — ED Notes (Signed)
Patient's belongings... Black shirt Dark blue jean A pair of white socks Belt Pair of Black shoes Black book bag. Red Underwear

## 2021-05-01 NOTE — ED Triage Notes (Signed)
Pt with right knee pain and swelling and suicidal ideation. Pt appears in no acute distress.

## 2021-05-01 NOTE — BH Assessment (Signed)
Writer spoke with patient's ACT Team, [facility] ([phone]). She states the last time they seen him was this past Thursday (04/28/2021). Most times it is difficult to reach him. They are currently in the process of securing him housing.

## 2021-05-01 NOTE — ED Notes (Signed)
Pt given lunch tray.

## 2021-05-01 NOTE — ED Notes (Signed)
Pt given graham crackers in addition to breakfast tray, pt told no more additional food available after this.

## 2021-05-01 NOTE — ED Notes (Signed)
VOL/Pending Placement 
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
## 2021-05-01 NOTE — ED Notes (Signed)
Pt ambulatory to bathroom but left urine specimen cup at bedside. Will remind pt to collect urine next time he needs to use the bathroom.

## 2021-05-01 NOTE — ED Notes (Signed)
TTS and psych NP at bedside. 

## 2021-05-01 NOTE — ED Notes (Signed)
Meal tray provided.

## 2021-05-01 NOTE — ED Notes (Signed)
Pt arrives with ems per ems pt walked to [place] from [employer], has right knee pain from walking, per ems pt states is suicidal. Vitals: 140/70, 110, 99% ra. Shan, ed tech is sitting with pt for safety at this time until room available.

## 2021-05-01 NOTE — ED Notes (Signed)
Pt given breakfast and juice at this time. 

## 2021-05-02 ENCOUNTER — Inpatient Hospital Stay
Admit: 2021-05-02 | Discharge: 2021-05-09 | DRG: 885 | Disposition: A | Discharge status: Home / Self Care | Payer: Medicare (Managed Care) | Source: Intra-hospital | Attending: Behavioral Health | Admitting: Behavioral Health

## 2021-05-02 ENCOUNTER — Other Ambulatory Visit: Payer: Self-pay

## 2021-05-02 ENCOUNTER — Encounter: Payer: Self-pay | Admitting: Psychiatry

## 2021-05-02 DIAGNOSIS — F314 Bipolar disorder, current episode depressed, severe, without psychotic features: Secondary | ICD-10-CM | POA: Diagnosis not present

## 2021-05-02 DIAGNOSIS — Z8249 Family history of ischemic heart disease and other diseases of the circulatory system: Secondary | ICD-10-CM | POA: Diagnosis not present

## 2021-05-02 DIAGNOSIS — F1914 Other psychoactive substance abuse with psychoactive substance-induced mood disorder: Secondary | ICD-10-CM | POA: Diagnosis present

## 2021-05-02 DIAGNOSIS — F1510 Other stimulant abuse, uncomplicated: Secondary | ICD-10-CM

## 2021-05-02 DIAGNOSIS — M1990 Unspecified osteoarthritis, unspecified site: Secondary | ICD-10-CM | POA: Diagnosis present

## 2021-05-02 DIAGNOSIS — Z9151 Personal history of suicidal behavior: Secondary | ICD-10-CM | POA: Diagnosis not present

## 2021-05-02 DIAGNOSIS — F17210 Nicotine dependence, cigarettes, uncomplicated: Secondary | ICD-10-CM | POA: Diagnosis present

## 2021-05-02 DIAGNOSIS — F1210 Cannabis abuse, uncomplicated: Secondary | ICD-10-CM | POA: Diagnosis present

## 2021-05-02 DIAGNOSIS — Z880 Allergy status to penicillin: Secondary | ICD-10-CM

## 2021-05-02 DIAGNOSIS — F1410 Cocaine abuse, uncomplicated: Secondary | ICD-10-CM | POA: Diagnosis present

## 2021-05-02 DIAGNOSIS — F3160 Bipolar disorder, current episode mixed, unspecified: Secondary | ICD-10-CM | POA: Diagnosis present

## 2021-05-02 DIAGNOSIS — I10 Essential (primary) hypertension: Secondary | ICD-10-CM | POA: Diagnosis present

## 2021-05-02 DIAGNOSIS — Z79899 Other long term (current) drug therapy: Secondary | ICD-10-CM | POA: Diagnosis not present

## 2021-05-02 DIAGNOSIS — F1010 Alcohol abuse, uncomplicated: Secondary | ICD-10-CM | POA: Diagnosis present

## 2021-05-02 DIAGNOSIS — R45851 Suicidal ideations: Secondary | ICD-10-CM | POA: Diagnosis present

## 2021-05-02 DIAGNOSIS — K760 Fatty (change of) liver, not elsewhere classified: Secondary | ICD-10-CM | POA: Diagnosis present

## 2021-05-02 DIAGNOSIS — Z8616 Personal history of COVID-19: Secondary | ICD-10-CM | POA: Diagnosis not present

## 2021-05-02 DIAGNOSIS — E119 Type 2 diabetes mellitus without complications: Secondary | ICD-10-CM | POA: Diagnosis present

## 2021-05-02 DIAGNOSIS — F3132 Bipolar disorder, current episode depressed, moderate: Secondary | ICD-10-CM | POA: Diagnosis not present

## 2021-05-02 DIAGNOSIS — K5790 Diverticulosis of intestine, part unspecified, without perforation or abscess without bleeding: Secondary | ICD-10-CM | POA: Diagnosis present

## 2021-05-02 DIAGNOSIS — Z9109 Other allergy status, other than to drugs and biological substances: Secondary | ICD-10-CM

## 2021-05-02 LAB — URINE DRUG SCREEN, QUALITATIVE (ARMC ONLY)
Amphetamines, Ur Screen: POSITIVE — AB
Barbiturates, Ur Screen: NOT DETECTED
Benzodiazepine, Ur Scrn: NOT DETECTED
Cannabinoid 50 Ng, Ur ~~LOC~~: NOT DETECTED
Cocaine Metabolite,Ur ~~LOC~~: NOT DETECTED
MDMA (Ecstasy)Ur Screen: NOT DETECTED
Methadone Scn, Ur: NOT DETECTED
Opiate, Ur Screen: NOT DETECTED
Phencyclidine (PCP) Ur S: NOT DETECTED
Tricyclic, Ur Screen: NOT DETECTED

## 2021-05-02 MED ORDER — DIVALPROEX SODIUM ER 500 MG PO TB24
1000.0000 mg | ORAL_TABLET | Freq: Every day | ORAL | Status: DC
  Administered 2021-05-02 – 2021-05-08 (×7): 1000 mg via ORAL
  Filled 2021-05-02 (×8): qty 2

## 2021-05-02 MED ORDER — ALUM & MAG HYDROXIDE-SIMETH 200-200-20 MG/5ML PO SUSP: 30.0000 mL | ORAL | Status: DC | PRN

## 2021-05-02 MED ORDER — TRAZODONE HCL 100 MG PO TABS
100.0000 mg | ORAL_TABLET | Freq: Every evening | ORAL | Status: DC | PRN
  Administered 2021-05-03: 100 mg via ORAL
  Filled 2021-05-02: qty 1

## 2021-05-02 MED ORDER — MAGNESIUM HYDROXIDE 400 MG/5ML PO SUSP: 30.0000 mL | Freq: Every day | ORAL | Status: DC | PRN

## 2021-05-02 MED ORDER — IBUPROFEN 600 MG PO TABS: 800.0000 mg | ORAL_TABLET | Freq: Four times a day (QID) | ORAL | Status: DC | PRN

## 2021-05-02 MED ORDER — ACETAMINOPHEN 325 MG PO TABS
650.0000 mg | ORAL_TABLET | Freq: Four times a day (QID) | ORAL | Status: DC | PRN
Start: 2021-05-02 — End: 2021-05-09

## 2021-05-02 MED ORDER — MIRTAZAPINE 15 MG PO TABS
15.0000 mg | ORAL_TABLET | Freq: Every day | ORAL | Status: DC
  Administered 2021-05-02 – 2021-05-08 (×7): 15 mg via ORAL
  Filled 2021-05-02 (×7): qty 1

## 2021-05-02 MED ORDER — HYDROXYZINE HCL 25 MG PO TABS
25.0000 mg | ORAL_TABLET | Freq: Three times a day (TID) | ORAL | Status: DC | PRN
Start: 2021-05-02 — End: 2021-05-09
  Administered 2021-05-04: 25 mg via ORAL
  Filled 2021-05-02: qty 1

## 2021-05-02 MED ORDER — ARIPIPRAZOLE 10 MG PO TABS
10.0000 mg | ORAL_TABLET | Freq: Every day | ORAL | Status: DC
  Administered 2021-05-03 – 2021-05-09 (×7): 10 mg via ORAL
  Filled 2021-05-02 (×7): qty 1

## 2021-05-02 NOTE — Progress Notes (Signed)
Patient admitted with suicidal thoughts with plan to shoot himself up with narcotics after being found lying on his mother's grave site. Continues to have passive SI but contracts for safety. Says he was doing cocaine and meth but has not done any in one week. Denies any other drug or alcohol use. Denies HI or voices. Skin search done with Rik, MHT and no contraband found

## 2021-05-02 NOTE — Plan of Care (Signed)
  Problem: Education: Goal: Knowledge of [place] [facility] Education information/materials will improve Outcome: Progressing Goal: Emotional status will improve Outcome: Progressing Goal: Mental status will improve Outcome: Progressing Goal: Verbalization of understanding the information provided will improve Outcome: Progressing   Problem: Activity: Goal: Interest or engagement in activities will improve Outcome: Progressing Goal: Sleeping patterns will improve Outcome: Progressing   Problem: Coping: Goal: Ability to verbalize frustrations and anger appropriately will improve Outcome: Progressing Goal: Ability to demonstrate self-control will improve Outcome: Progressing   Problem: Health Behavior/Discharge Planning: Goal: Identification of resources available to assist in meeting health care needs will improve Outcome: Progressing Goal: Compliance with treatment plan for underlying cause of condition will improve Outcome: Progressing   Problem: Physical Regulation: Goal: Ability to maintain clinical measurements within normal limits will improve Outcome: Progressing   Problem: Safety: Goal: Periods of time without injury will increase Outcome: Progressing   Problem: Education: Goal: Utilization of techniques to improve thought processes will improve Outcome: Progressing Goal: Knowledge of the prescribed therapeutic regimen will improve Outcome: Progressing   Problem: Activity: Goal: Interest or engagement in leisure activities will improve Outcome: Progressing Goal: Imbalance in normal sleep/wake cycle will improve Outcome: Progressing   Problem: Coping: Goal: Coping ability will improve Outcome: Progressing Goal: Will verbalize feelings Outcome: Progressing   Problem: Health Behavior/Discharge Planning: Goal: Ability to make decisions will improve Outcome: Progressing Goal: Compliance with therapeutic regimen will improve Outcome: Progressing    Problem: Role Relationship: Goal: Will demonstrate positive changes in social behaviors and relationships Outcome: Progressing   Problem: Safety: Goal: Ability to disclose and discuss suicidal ideas will improve Outcome: Progressing Goal: Ability to identify and utilize support systems that promote safety will improve Outcome: Progressing   Problem: Self-Concept: Goal: Will verbalize positive feelings about self Outcome: Progressing Goal: Level of anxiety will decrease Outcome: Progressing   

## 2021-05-02 NOTE — ED Provider Notes (Signed)
Emergency Medicine Observation Re-evaluation Note  Cory Floyd is a 45 y.o. male, seen on rounds today.  Pt initially presented to the ED for complaints of Suicidal  Currently, the patient is resting  Physical Exam  Blood pressure 106/71, pulse 63, temperature 97.8 F (36.6 C), resp. rate 17, height 5\' 10"  (1.778 m), weight 79.4 kg, SpO2 99 %.  Physical Exam Gen: No acute distress  Resp: Normal rise and fall of chest Neuro: Moving all four extremities Psych: Resting currently, calm and cooperative when awake  ED Course / MDM     I have reviewed the labs performed to date as well as medications administered while in observation.  Recent changes in the last 24 hours include none  Plan   Current plan is to continue to wait for psych plan/placement if felt warranted  Patient is not under full IVC at this time.   , MD 05/02/21 [phone]

## 2021-05-02 NOTE — ED Notes (Signed)
Psychiatrist at bedside

## 2021-05-02 NOTE — ED Notes (Signed)
Up to bathroom and back into room.

## 2021-05-02 NOTE — ED Notes (Signed)
Vol /pending placement 

## 2021-05-02 NOTE — Consult Note (Signed)
[facility] Face-to-Face Psychiatry Consult   Reason for Consult: Follow-up consult 45 year old man with history of depression and substance abuse came into the emergency room reporting suicidal ideation Referring Physician: Erma Heritage Patient Identification: Cory Floyd MRN:  [id number] Principal Diagnosis: Bipolar affective disorder, depressed, moderate (HCC) Diagnosis:  Principal Problem:   Bipolar affective disorder, depressed, moderate (HCC) Active Problems:   Suicidal ideation   Amphetamine abuse (HCC)   Total Time spent with patient: 45 minutes  Subjective:   Cory Floyd is a 45 y.o. male patient admitted with "I just want to go and lay down next to my mother".  HPI: Patient seen chart reviewed.  Patient with a long history of substance abuse mood symptoms and depression.  Apparently walked from [place] to [place] and came into the hospital complaining of sore knees as well as suicidal thoughts.  Patient is passively cooperative today.  Makes little eye contact.  Continues to talk about how he wants to just lay down next to his mother (who is deceased) and stay there forever.  When asked to be more specific says he is going to shoot himself up with a bunch of narcotics to kill himself.  Claims that he has been off drugs for over a week but drug screen is still positive for amphetamines.  Patient is irritable expresses depression and feelings of hopelessness  Past Psychiatric History: Past history of longstanding mood and substance abuse problems.  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  Frequent visitor to the emergency room.  Risk to Self:   Risk to Others:   Prior Inpatient Therapy:   Prior Outpatient Therapy:    Past Medical History:  Past Medical History:  Diagnosis Date   Alcoholic (HCC)    clean for 3 years   Anginal pain (HCC)    Arthritis    Bipolar 1 disorder (HCC)    COVID-19    Depressed    Diabetes mellitus without complication (HCC)    Dyspnea    Fatty liver     Hypertension    Schizophrenia (HCC)     Past Surgical History:  Procedure Laterality Date   COLONOSCOPY  02/05/2014   diverticulosis, hyperplastic polyp x 2   fracture arm Right    KNEE SURGERY Right 2013   NASAL SEPTUM SURGERY     NASAL TURBINATE REDUCTION Bilateral 12/12/2016   Procedure: TURBINATE REDUCTION/SUBMUCOSAL RESECTION;  Surgeon: Linus Salmons, MD;  Location: [facility] ORS;  Service: ENT;  Laterality: Bilateral;   Family History:  Family History  Problem Relation Age of Onset   Hepatitis C Mother    Cancer Mother    Cancer Maternal Aunt    Hypertension Maternal Grandmother    Family Psychiatric  History: See previous Social History:  Social History   Substance and Sexual Activity  Alcohol Use Yes   Comment: 1/2-5th pint of liquor     Social History   Substance and Sexual Activity  Drug Use Yes   Types: Cocaine, Marijuana   Comment: heroin    Social History   Socioeconomic History   Marital status: Divorced    Spouse name: Not on file   Number of children: Not on file   Years of education: Not on file   Highest education level: Not on file  Occupational History   Not on file  Tobacco Use   Smoking status: Every Day    Packs/day: 1.00    Years: 20.00    Pack years: 20.00    Types: Cigarettes  Smokeless tobacco: Never  Vaping Use   Vaping Use: Never used  Substance and Sexual Activity   Alcohol use: Yes    Comment: 1/2-5th pint of liquor   Drug use: Yes    Types: Cocaine, Marijuana    Comment: heroin   Sexual activity: Yes    Birth control/protection: None  Other Topics Concern   Not on file  Social History Narrative   Not on file   Social Determinants of Health   Financial Resource Strain: Not on file  Food Insecurity: Not on file  Transportation Needs: Not on file  Physical Activity: Not on file  Stress: Not on file  Social Connections: Not on file   Additional Social History:    Allergies:   Allergies  Allergen Reactions    Carrot [Daucus Carota] Anaphylaxis and Rash   Penicillins Anaphylaxis and Hives    Has patient had a PCN reaction causing immediate rash, facial/tongue/throat swelling, SOB or lightheadedness with hypotension: Yes Has patient had a PCN reaction causing severe rash involving mucus membranes or skin necrosis: No Has patient had a PCN reaction that required hospitalization Yes Has patient had a PCN reaction occurring within the last 10 years: No If all of the above answers are "NO", then may proceed with Cephalosporin use.     Labs:  Results for orders placed or performed during the hospital encounter of 05/01/21 (from the past 48 hour(s))  Comprehensive metabolic panel     Status: Abnormal   Collection Time: 05/01/21 12:49 AM  Result Value Ref Range   Sodium 140 135 - 145 mmol/L   Potassium 3.6 3.5 - 5.1 mmol/L   Chloride 110 98 - 111 mmol/L   CO2 27 22 - 32 mmol/L   Glucose, Bld 99 70 - 99 mg/dL    Comment: Glucose reference range applies only to samples taken after fasting for at least 8 hours.   BUN 20 6 - 20 mg/dL   Creatinine, Ser 0.86 0.61 - 1.24 mg/dL   Calcium 9.0 8.9 - 57.8 mg/dL   Total Protein 7.3 6.5 - 8.1 g/dL   Albumin 4.1 3.5 - 5.0 g/dL   AST 33 15 - 41 U/L   ALT 29 0 - 44 U/L   Alkaline Phosphatase 64 38 - 126 U/L   Total Bilirubin 0.5 0.3 - 1.2 mg/dL   GFR, Estimated >46 >96 mL/min    Comment: (NOTE) Calculated using the CKD-EPI Creatinine Equation (2021)    Anion gap 3 (L) 5 - 15    Comment: Performed at [facility], [address], [place], [place] [address]  Ethanol     Status: None   Collection Time: 05/01/21 12:49 AM  Result Value Ref Range   Alcohol, Ethyl (B) <10 <10 mg/dL    Comment: (NOTE) Lowest detectable limit for serum alcohol is 10 mg/dL.  For medical purposes only. Performed at [facility], [address] [place], [place] [address]   Salicylate level     Status: Abnormal   Collection Time: 05/01/21 12:49 AM  Result  Value Ref Range   Salicylate Lvl <7.0 (L) 7.0 - 30.0 mg/dL    Comment: Performed at [facility], [address] [place], [place] [address]  Acetaminophen level     Status: Abnormal   Collection Time: 05/01/21 12:49 AM  Result Value Ref Range   Acetaminophen (Tylenol), Serum <10 (L) 10 - 30 ug/mL    Comment: (NOTE) Therapeutic concentrations vary significantly. A range of 10-30  ug/mL  may be an effective concentration for many patients. However, some  are best treated at concentrations outside of this range. Acetaminophen concentrations >150 ug/mL at 4 hours after ingestion  and >50 ug/mL at 12 hours after ingestion are often associated with  toxic reactions.  Performed at [facility], [address] [place], [place] [address]   cbc     Status: Abnormal   Collection Time: 05/01/21 12:49 AM  Result Value Ref Range   WBC 6.6 4.0 - 10.5 K/uL   RBC 4.16 (L) 4.22 - 5.81 MIL/uL   Hemoglobin 12.6 (L) 13.0 - 17.0 g/dL   HCT 40.9 (L) 81.1 - 91.4 %   MCV 90.4 80.0 - 100.0 fL   MCH 30.3 26.0 - 34.0 pg   MCHC 33.5 30.0 - 36.0 g/dL   RDW 78.2 95.6 - 21.3 %   Platelets 180 150 - 400 K/uL   nRBC 0.0 0.0 - 0.2 %    Comment: Performed at [facility], [address] [place], [place] [address]  Valproic acid level     Status: Abnormal   Collection Time: 05/01/21 12:49 AM  Result Value Ref Range   Valproic Acid Lvl <10 (L) 50.0 - 100.0 ug/mL    Comment: RESULTS CONFIRMED BY MANUAL DILUTION SS DLB Performed at [facility], [address] [place], [place] [address]   Resp Panel by RT-PCR (Flu A&B, Covid) Nasopharyngeal Swab     Status: None   Collection Time: 05/01/21  2:12 AM   Specimen: Nasopharyngeal Swab; Nasopharyngeal(NP) swabs in vial transport medium  Result Value Ref Range   SARS Coronavirus 2 by RT PCR NEGATIVE NEGATIVE    Comment: (NOTE) SARS-CoV-2 target nucleic acids are NOT DETECTED.  The SARS-CoV-2 RNA is generally detectable in  upper respiratory specimens during the acute phase of infection. The lowest concentration of SARS-CoV-2 viral copies this assay can detect is 138 copies/mL. A negative result does not preclude SARS-Cov-2 infection and should not be used as the sole basis for treatment or other patient management decisions. A negative result may occur with  improper specimen collection/handling, submission of specimen other than nasopharyngeal swab, presence of viral mutation(s) within the areas targeted by this assay, and inadequate number of viral copies(<138 copies/mL). A negative result must be combined with clinical observations, patient history, and epidemiological information. The expected result is Negative.  Fact Sheet for Patients:  BloggerCourse.com  Fact Sheet for Healthcare Providers:  SeriousBroker.it  This test is no t yet approved or cleared by the [place] FDA and  has been authorized for detection and/or diagnosis of SARS-CoV-2 by FDA under an Emergency Use Authorization (EUA). This EUA will remain  in effect (meaning this test can be used) for the duration of the COVID-19 declaration under Section 564(b)(1) of the Act, 21 U.S.C.section 360bbb-3(b)(1), unless the authorization is terminated  or revoked sooner.       Influenza A by PCR NEGATIVE NEGATIVE   Influenza B by PCR NEGATIVE NEGATIVE    Comment: (NOTE) The Xpert Xpress SARS-CoV-2/FLU/RSV plus assay is intended as an aid in the diagnosis of influenza from Nasopharyngeal swab specimens and should not be used as a sole basis for treatment. Nasal washings and aspirates are unacceptable for Xpert Xpress SARS-CoV-2/FLU/RSV testing.  Fact Sheet for Patients: BloggerCourse.com  Fact Sheet for Healthcare Providers: SeriousBroker.it  This test is not yet approved or cleared by the [place] FDA and has been authorized  for detection and/or diagnosis of  SARS-CoV-2 by FDA under an Emergency Use Authorization (EUA). This EUA will remain in effect (meaning this test can be used) for the duration of the COVID-19 declaration under Section 564(b)(1) of the Act, 21 U.S.C. section 360bbb-3(b)(1), unless the authorization is terminated or revoked.  Performed at [facility], [address] [place], [place] [address]   Urine Drug Screen, Qualitative     Status: Abnormal   Collection Time: 05/01/21  8:50 AM  Result Value Ref Range   Tricyclic, Ur Screen NONE DETECTED NONE DETECTED   Amphetamines, Ur Screen POSITIVE (A) NONE DETECTED   MDMA (Ecstasy)Ur Screen NONE DETECTED NONE DETECTED   Cocaine Metabolite,Ur [place] NONE DETECTED NONE DETECTED   Opiate, Ur Screen NONE DETECTED NONE DETECTED   Phencyclidine (PCP) Ur S NONE DETECTED NONE DETECTED   Cannabinoid 50 Ng, Ur [place] NONE DETECTED NONE DETECTED   Barbiturates, Ur Screen NONE DETECTED NONE DETECTED   Benzodiazepine, Ur Scrn NONE DETECTED NONE DETECTED   Methadone Scn, Ur NONE DETECTED NONE DETECTED    Comment: (NOTE) Tricyclics + metabolites, urine    Cutoff 1000 ng/mL Amphetamines + metabolites, urine  Cutoff 1000 ng/mL MDMA (Ecstasy), urine              Cutoff 500 ng/mL Cocaine Metabolite, urine          Cutoff 300 ng/mL Opiate + metabolites, urine        Cutoff 300 ng/mL Phencyclidine (PCP), urine         Cutoff 25 ng/mL Cannabinoid, urine                 Cutoff 50 ng/mL Barbiturates + metabolites, urine  Cutoff 200 ng/mL Benzodiazepine, urine              Cutoff 200 ng/mL Methadone, urine                   Cutoff 300 ng/mL  The urine drug screen provides only a preliminary, unconfirmed analytical test result and should not be used for non-medical purposes. Clinical consideration and professional judgment should be applied to any positive drug screen result due to possible interfering substances. A more specific alternate chemical  method must be used in order to obtain a confirmed analytical result. Gas chromatography / mass spectrometry (GC/MS) is the preferred confirm atory method. Performed at [facility], [address] [place], [place] [address]     Current Facility-Administered Medications  Medication Dose Route Frequency Provider Last Rate Last Admin   ARIPiprazole (ABILIFY) tablet 10 mg  10 mg Oral Daily Ward, Kristen N, DO   10 mg at 05/02/21 1118   divalproex (DEPAKOTE ER) 24 hr tablet 1,000 mg  1,000 mg Oral QHS Ward, Kristen N, DO   1,000 mg at 05/01/21 2157   hydrOXYzine (ATARAX/VISTARIL) tablet 25 mg  25 mg Oral TID PRN Ward, Layla Maw, DO       ibuprofen (ADVIL) tablet 800 mg  800 mg Oral Q6H PRN Ward, Kristen N, DO   800 mg at 05/02/21 0231   mirtazapine (REMERON) tablet 15 mg  15 mg Oral QHS Ward, Kristen N, DO   15 mg at 05/01/21 2158   traZODone (DESYREL) tablet 100 mg  100 mg Oral QHS PRN Ward, Layla Maw, DO       Current Outpatient Medications  Medication Sig Dispense Refill   ARIPiprazole (ABILIFY) 10 MG tablet Take 1 tablet (10 mg total) by mouth daily. For mood control 30 tablet 0  divalproex (DEPAKOTE ER) 500 MG 24 hr tablet Take 2 tablets (1,000 mg total) by mouth at bedtime. For mood stabilization 60 tablet 0   hydrOXYzine (ATARAX/VISTARIL) 25 MG tablet Take 1 tablet (25 mg total) by mouth 3 (three) times daily as needed for anxiety. 75 tablet 0   mirtazapine (REMERON) 15 MG tablet Take 1 tablet (15 mg total) by mouth at bedtime. For depression/sleep 30 tablet 0   traZODone (DESYREL) 100 MG tablet Take 1 tablet (100 mg total) by mouth at bedtime as needed for sleep. 30 tablet 0    Musculoskeletal: Strength & Muscle Tone: within normal limits Gait & Station: normal Patient leans: N/A            Psychiatric Specialty Exam:  Presentation  General Appearance: Disheveled  Eye Contact:Fair  Speech:Clear and Coherent  Speech  Volume:Decreased  Handedness:Right   Mood and Affect  Mood:Anxious; Depressed  Affect:Depressed   Thought Process  Thought Processes:Coherent  Descriptions of Associations:Intact  Orientation:Full (Time, Place and Person)  Thought Content:Logical  History of Schizophrenia/Schizoaffective disorder:No  Duration of Psychotic Symptoms:Less than six months  Hallucinations:Hallucinations: None  Ideas of Reference:None  Suicidal Thoughts:Suicidal Thoughts: Yes, Active SI Active Intent and/or Plan: With Plan  Homicidal Thoughts:Homicidal Thoughts: No   Sensorium  Memory:Immediate Fair  Judgment:Poor  Insight:Poor   Executive Functions  Concentration:Poor  Attention Span:Fair  Recall:Fair  Fund of Knowledge:Fair  Language:Fair   Psychomotor Activity  Psychomotor Activity:Psychomotor Activity: Normal   Assets  Assets:Communication Skills; Desire for Improvement; Social Support   Sleep  Sleep:Sleep: Fair   Physical Exam: Physical Exam Vitals and nursing note reviewed.  Constitutional:      Appearance: Normal appearance.  HENT:     Head: Normocephalic and atraumatic.     Mouth/Throat:     Pharynx: Oropharynx is clear.  Eyes:     Pupils: Pupils are equal, round, and reactive to light.  Cardiovascular:     Rate and Rhythm: Normal rate and regular rhythm.  Pulmonary:     Effort: Pulmonary effort is normal.     Breath sounds: Normal breath sounds.  Abdominal:     General: Abdomen is flat.     Palpations: Abdomen is soft.  Musculoskeletal:        General: Normal range of motion.  Skin:    General: Skin is warm and dry.  Neurological:     General: No focal deficit present.     Mental Status: He is alert. Mental status is at baseline.  Psychiatric:        Attention and Perception: He is inattentive.        Mood and Affect: Mood is depressed. Affect is blunt.        Speech: Speech is delayed.        Behavior: Behavior is slowed and  withdrawn.        Thought Content: Thought content includes suicidal ideation. Thought content includes suicidal plan.        Cognition and Memory: Cognition is impaired.        Judgment: Judgment is impulsive.   Review of Systems  Constitutional: Negative.   HENT: Negative.    Eyes: Negative.   Respiratory: Negative.    Cardiovascular: Negative.   Gastrointestinal: Negative.   Musculoskeletal: Negative.   Skin: Negative.   Neurological: Negative.   Psychiatric/Behavioral:  Positive for depression, substance abuse and suicidal ideas. Negative for hallucinations. The patient is nervous/anxious and has insomnia.   Blood pressure (!) 109/56, pulse [name])  59, temperature 98 F (36.7 C), temperature source Oral, resp. rate 16, height 5\' 10"  (1.778 m), weight 79.4 kg, SpO2 100 %. Body mass index is 25.11 kg/m.  Treatment Plan Summary: Plan patient continues to endorse suicidal ideation and hopelessness.  Plan will be for admission to the psychiatric hospital.  Case reviewed with inpatient team downstairs and emergency room staff.  Orders will be placed for admission.  Apparently has not been drinking recently does not appear to be having withdrawal symptoms but probably will not need any medication for withdrawal.  Disposition: Recommend psychiatric Inpatient admission when medically cleared.  Mordecai RasmussenJohn Heidie Krall, MD 05/02/2021 12:11 PM

## 2021-05-02 NOTE — Tx Team (Signed)
Initial Treatment Plan 05/02/2021 10:34 PM [name] XNT:[id number]    PATIENT STRESSORS: Substance abuse   PATIENT STRENGTHS: Ability for insight Average or above average intelligence Capable of independent living Communication skills   PATIENT IDENTIFIED PROBLEMS:           Suicidal ideation           DISCHARGE CRITERIA:  Ability to meet basic life and health needs Adequate post-discharge living arrangements Improved stabilization in mood, thinking, and/or behavior Medical problems require only outpatient monitoring Motivation to continue treatment in a less acute level of care Need for constant or close observation no longer present Reduction of life-threatening or endangering symptoms to within safe limits Safe-care adequate arrangements made Verbal commitment to aftercare and medication compliance  PRELIMINARY DISCHARGE PLAN: Outpatient therapy Return to previous living arrangement  PATIENT/FAMILY INVOLVEMENT: This treatment plan has been presented to and reviewed with the patient, Cory Floyd, and/or family member.  The patient and family have been given the opportunity to ask questions and make suggestions.  Billy Coast, RN 05/02/2021, 10:34 PM

## 2021-05-02 NOTE — ED Notes (Signed)
Report given to [facility] [place] in [facility]

## 2021-05-03 DIAGNOSIS — F314 Bipolar disorder, current episode depressed, severe, without psychotic features: Secondary | ICD-10-CM | POA: Diagnosis not present

## 2021-05-03 MED ORDER — LIDOCAINE 4 % EX CREA
TOPICAL_CREAM | Freq: Three times a day (TID) | CUTANEOUS | Status: DC | PRN
  Filled 2021-05-03: qty 5
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
## 2021-05-03 NOTE — Plan of Care (Signed)
Patient endorses passive SI, verbally contracts for safety   Problem: Education: Goal: Emotional status will improve Outcome: Not Progressing Goal: Mental status will improve Outcome: Not Progressing   

## 2021-05-03 NOTE — Evaluation (Signed)
Physical Therapy Evaluation Patient Details Name: Cory Floyd MRN: [id number] DOB: December 20, 1975 Today's Date: 05/03/2021   History of Present Illness  Per MD notes, pt is 45 year old male with history of bipolar disorder and polysubstance abuse presenting to emergency room for suicidal ideations. Pt reports increased R knee pain after walking from [facility] to [place] and has PMH of reconstructive R knee surgeries.  Clinical Impression  Pt was pleasant and willing to participate during the session. Pt reports that he walked 10hrs from [place] to [place] and now has increase pain to R knee. Pt overall steady walking without AD except for mild antalgic gait secondary to R knee pain. Pt educated on gait using RW to relieve pain but pt reported he rather not use it after trying it. Pt did express desire to discuss about a potential R TKA which has been discussed in the past but he feels it may be beneficial now. Pt's concerns forwarded to MD.No skilled PT needs identified at this time. Will complete PT orders at this time but will reassess pt pending a change in status upon receipt of new PT orders.     Follow Up Recommendations No PT follow up    Equipment Recommendations  None recommended by PT    Recommendations for Other Services       Precautions / Restrictions Restrictions Weight Bearing Restrictions: No      Mobility  Bed Mobility Overal bed mobility: Independent                  Transfers Overall transfer level: Independent Equipment used: None             General transfer comment: Good eccentric/concentric control  Ambulation/Gait Ambulation/Gait assistance: Independent Gait Distance (Feet): 45 Feet x1, 5 feet x1 Assistive device: None;Rolling walker (2 wheeled) Gait Pattern/deviations: Decreased stride length;Decreased stance time - right;Decreased step length - left;Antalgic Gait velocity: decreased   General Gait Details: Pt overall steady  walking without AD with mild antalgic gait secondary to increased R knee pain. Pt walked 69ft with RW and was steady but reported he rather not use it.  Stairs            Wheelchair Mobility    Modified Rankin (Stroke Patients Only)       Balance Overall balance assessment: Independent;History of Falls;No apparent balance deficits (not formally assessed) (Pt demonstrated normal balance in sitting and standing but reported history of falls secondary to R knee buckling.)                                           Pertinent Vitals/Pain Pain Assessment: 0-10 Pain Score: 7  Pain Location: R knee Pain Descriptors / Indicators: Aching;Sore Pain Intervention(s): Monitored during session    Home Living Family/patient expects to be discharged to:: Private residence Living Arrangements: Alone Available Help at Discharge: Other (Comment) (Pt reports having nobody that could help him) Type of Home: House Home Access: Level entry     Home Layout: One level Home Equipment: Grab bars - tub/shower;Grab bars - toilet      Prior Function Level of Independence: Independent         Comments: Pt is independent with ambulation and all ADLs. Pt reports intermittent buckling of R knee which has resulted in 2-3 falls in the last year.     Hand Dominance  Extremity/Trunk Assessment   Upper Extremity Assessment Upper Extremity Assessment: Overall WFL for tasks assessed (Grossly at least 4/5)    Lower Extremity Assessment Lower Extremity Assessment: Overall WFL for tasks assessed (Grossly at least 4/5)    Cervical / Trunk Assessment Cervical / Trunk Assessment: Normal  Communication   Communication: No difficulties  Cognition Arousal/Alertness: Awake/alert Behavior During Therapy: WFL for tasks assessed/performed Overall Cognitive Status: Within Functional Limits for tasks assessed                                        General  Comments      Exercises Other Exercises Other Exercises: Provided education on gait pattern with RW verbally and via demonstration to decrease WB on R knee secondary to increased pain.   Assessment/Plan    PT Assessment Patent does not need any further PT services  PT Problem List Pain       PT Treatment Interventions      PT Goals (Current goals can be found in the Care Plan section)  Acute Rehab PT Goals Patient Stated Goal: Dunk a basketball PT Goal Formulation: With patient Time For Goal Achievement: 05/16/21 Potential to Achieve Goals: Good    Frequency     Barriers to discharge        Co-evaluation               AM-PAC PT "6 Clicks" Mobility  Outcome Measure Help needed turning from your back to your side while in a flat bed without using bedrails?: None Help needed moving from lying on your back to sitting on the side of a flat bed without using bedrails?: None Help needed moving to and from a bed to a chair (including a wheelchair)?: None Help needed standing up from a chair using your arms (e.g., wheelchair or bedside chair)?: None Help needed to walk in hospital room?: None Help needed climbing 3-5 steps with a railing? : None 6 Click Score: 24    End of Session   Activity Tolerance: Patient tolerated treatment well Patient left: in bed Nurse Communication: Mobility status PT Visit Diagnosis: Pain Pain - Right/Left: Right Pain - part of body: Knee    Time: 8341-9622 PT Time Calculation (min) (ACUTE ONLY): 22 min   Charges:              Desiree Hane SPT 05/03/21, 4:27 PM

## 2021-05-03 NOTE — H&P (Signed)
Psychiatric Admission Assessment Adult  Patient Identification: Cory Floyd MRN:  [id number] Date of Evaluation:  05/03/2021 Chief Complaint:  Bipolar disorder with severe depression (HCC) [F31.4] Principal Diagnosis: Bipolar disorder with severe depression (HCC) Diagnosis:  Principal Problem:   Bipolar disorder with severe depression (HCC) Active Problems:   Arthritis   Amphetamine abuse (HCC)  CC "I had a plan."  History of Present Illness: 45 year old male with history of bipolar disorder and polysubstance abuse presenting to emergency room for suicidal ideations.  He reports he had a plan to get to [place], lay on his mother's grave, and injected multiple illicit substances in order to kill himself.  He states he has access to means to get drugs.  He is labile.  He was encouraged by his ACT team to come to the hospital, and complied.  He is fairly irritable when discussing his ACT team.  He notes that all that 1 person are very unhelpful.  He has been off his nullify and Depakote for quite some time.  He notes that his act team insists on following him in a pharmacy across town which she does not have access to.  It sounds like his ACT team has not been dropping off his medications to his place of residence per his report.  He desires residential substance abuse treatment after psychiatric stabilization.  He cannot recall if Abilify or Depakote helped his mood, but cannot recall any bad side effects either.  He is agreeable to restarting these medications.  Associated Signs/Symptoms: Depression Symptoms:  depressed mood, feelings of worthlessness/guilt, hopelessness, suicidal thoughts with specific plan, Duration of Depression Symptoms: Greater than two weeks  (Hypo) Manic Symptoms:  Irritable Mood, Anxiety Symptoms:  Excessive Worry, Psychotic Symptoms:   Denies PTSD Symptoms: Negative Total Time spent with patient: 1 hour  Past Psychiatric History: Longstanding history of  both mood and substance abuse problems.  Recently hospitalized last month.  He has an act team in [place].  Recently comes to the emergency room.  Prior suicide attempts via overdose.  Is the patient at risk to self? Yes.    Has the patient been a risk to self in the past 6 months? Yes.    Has the patient been a risk to self within the distant past? Yes.    Is the patient a risk to others? No.  Has the patient been a risk to others in the past 6 months? No.  Has the patient been a risk to others within the distant past? No.   Prior Inpatient Therapy:   Prior Outpatient Therapy:    Alcohol Screening: Patient refused Alcohol Screening Tool: Yes 1. How often do you have a drink containing alcohol?: Monthly or less 2. How many drinks containing alcohol do you have on a typical day when you are drinking?: 1 or 2 3. How often do you have six or more drinks on one occasion?: Never AUDIT-C Score: 1 4. How often during the last year have you found that you were not able to stop drinking once you had started?: Never 5. How often during the last year have you failed to do what was normally expected from you because of drinking?: Never 6. How often during the last year have you needed a first drink in the morning to get yourself going after a heavy drinking session?: Never 7. How often during the last year have you had a feeling of guilt of remorse after drinking?: Never 8. How often during the last year  have you been unable to remember what happened the night before because you had been drinking?: Never 9. Have you or someone else been injured as a result of your drinking?: No 10. Has a relative or friend or a doctor or another health worker been concerned about your drinking or suggested you cut down?: No Alcohol Use Disorder Identification Test Final Score (AUDIT): 1 Substance Abuse History in the last 12 months:  Yes.   Consequences of Substance Abuse: Worsening mood and suicidal  ideations Previous Psychotropic Medications: Yes  Psychological Evaluations: Yes  Past Medical History:  Past Medical History:  Diagnosis Date   Alcoholic (HCC)    clean for 3 years   Anginal pain (HCC)    Arthritis    Bipolar 1 disorder (HCC)    COVID-19    Depressed    Diabetes mellitus without complication (HCC)    Dyspnea    Fatty liver    Hypertension    Schizophrenia (HCC)     Past Surgical History:  Procedure Laterality Date   COLONOSCOPY  02/05/2014   diverticulosis, hyperplastic polyp x 2   fracture arm Right    KNEE SURGERY Right 2013   NASAL SEPTUM SURGERY     NASAL TURBINATE REDUCTION Bilateral 12/12/2016   Procedure: TURBINATE REDUCTION/SUBMUCOSAL RESECTION;  Surgeon: Linus Salmons, MD;  Location: [facility] ORS;  Service: ENT;  Laterality: Bilateral;   Family History:  Family History  Problem Relation Age of Onset   Hepatitis C Mother    Cancer Mother    Cancer Maternal Aunt    Hypertension Maternal Grandmother    Family Psychiatric  History: Reports he has an uncle and aunt who completed suicide. Tobacco Screening:   Social History:  Social History   Substance and Sexual Activity  Alcohol Use Yes   Comment: 1/2-5th pint of liquor     Social History   Substance and Sexual Activity  Drug Use Yes   Types: Cocaine, Marijuana   Comment: heroin    Additional Social History:                           Allergies:   Allergies  Allergen Reactions   Carrot [Daucus Carota] Anaphylaxis and Rash   Penicillins Anaphylaxis and Hives    Has patient had a PCN reaction causing immediate rash, facial/tongue/throat swelling, SOB or lightheadedness with hypotension: Yes Has patient had a PCN reaction causing severe rash involving mucus membranes or skin necrosis: No Has patient had a PCN reaction that required hospitalization Yes Has patient had a PCN reaction occurring within the last 10 years: No If all of the above answers are "NO", then may proceed  with Cephalosporin use.    Lab Results: No results found for this or any previous visit (from the past 48 hour(s)).  Blood Alcohol level:  Lab Results  Component Value Date   ETH <10 05/01/2021   ETH <10 04/01/2021    Metabolic Disorder Labs:  Lab Results  Component Value Date   HGBA1C 5.5 04/02/2021   MPG 111 04/02/2021   MPG 119.76 01/23/2021   No results found for: PROLACTIN Lab Results  Component Value Date   CHOL 168 04/02/2021   TRIG 131 04/02/2021   HDL 61 04/02/2021   CHOLHDL 2.8 04/02/2021   VLDL 26 04/02/2021   LDLCALC 81 04/02/2021   LDLCALC 105 (H) 01/23/2021    Current Medications: Current Facility-Administered Medications  Medication Dose Route Frequency Provider Last Rate  Last Admin   acetaminophen (TYLENOL) tablet 650 mg  650 mg Oral Q6H PRN Clapacs, [name], MD       alum & mag hydroxide-simeth (MAALOX/MYLANTA) 200-200-20 MG/5ML suspension 30 mL  30 mL Oral Q4H PRN Clapacs, [name], MD       ARIPiprazole (ABILIFY) tablet 10 mg  10 mg Oral Daily Clapacs, [name], MD   10 mg at 05/03/21 0753   divalproex (DEPAKOTE ER) 24 hr tablet 1,000 mg  1,000 mg Oral QHS Clapacs, [name], MD   1,000 mg at 05/02/21 2124   hydrOXYzine (ATARAX/VISTARIL) tablet 25 mg  25 mg Oral TID PRN Clapacs, Jackquline Denmark, MD       ibuprofen (ADVIL) tablet 800 mg  800 mg Oral Q6H PRN Clapacs, [name], MD       magnesium hydroxide (MILK OF MAGNESIA) suspension 30 mL  30 mL Oral Daily PRN Clapacs, [name], MD       mirtazapine (REMERON) tablet 15 mg  15 mg Oral QHS Clapacs, [name], MD   15 mg at 05/02/21 2124   traZODone (DESYREL) tablet 100 mg  100 mg Oral QHS PRN Clapacs, Jackquline Denmark, MD       PTA Medications: Medications Prior to Admission  Medication Sig Dispense Refill Last Dose   ARIPiprazole (ABILIFY) 10 MG tablet Take 1 tablet (10 mg total) by mouth daily. For mood control 30 tablet 0    divalproex (DEPAKOTE ER) 500 MG 24 hr tablet Take 2 tablets (1,000 mg total) by mouth at bedtime. For mood  stabilization 60 tablet 0    hydrOXYzine (ATARAX/VISTARIL) 25 MG tablet Take 1 tablet (25 mg total) by mouth 3 (three) times daily as needed for anxiety. 75 tablet 0    mirtazapine (REMERON) 15 MG tablet Take 1 tablet (15 mg total) by mouth at bedtime. For depression/sleep 30 tablet 0    traZODone (DESYREL) 100 MG tablet Take 1 tablet (100 mg total) by mouth at bedtime as needed for sleep. 30 tablet 0     Musculoskeletal: Strength & Muscle Tone: within normal limits Gait & Station: normal Patient leans: N/A            Psychiatric Specialty Exam:  Presentation  General Appearance: Disheveled  Eye Contact:Minimal  Speech:Normal Rate  Speech Volume:Normal  Handedness:Right   Mood and Affect  Mood:Irritable; Dysphoric  Affect:Congruent   Thought Process  Thought Processes:Goal Directed; Coherent  Duration of Psychotic Symptoms: Less than six months  Past Diagnosis of Schizophrenia or Psychoactive disorder: No  Descriptions of Associations:Intact  Orientation:Full (Time, Place and Person)  Thought Content:Rumination  Hallucinations:Hallucinations: None  Ideas of Reference:None  Suicidal Thoughts:Suicidal Thoughts: Yes, Active SI Active Intent and/or Plan: With Intent; With Plan; With Means to Carry Out  Homicidal Thoughts:Homicidal Thoughts: No   Sensorium  Memory:Immediate Fair; Recent Fair; Remote Fair  Judgment:Impaired  Insight:Shallow   Executive Functions  Concentration:Fair  Attention Span:Fair  Recall:Fair  Fund of Knowledge:Fair  Language:Fair   Psychomotor Activity  Psychomotor Activity:Psychomotor Activity: Decreased   Assets  Assets:Desire for Improvement; Financial Resources/Insurance; Housing; Social Support   Sleep  Sleep:Sleep: Good Number of Hours of Sleep: 8    Physical Exam: Physical Exam Vitals and nursing note reviewed.  Constitutional:      Appearance: Normal appearance.  HENT:     Head:  Normocephalic and atraumatic.     Right Ear: External ear normal.     Left Ear: External ear normal.     Nose:  Nose normal.     Mouth/Throat:     Mouth: Mucous membranes are moist.     Pharynx: Oropharynx is clear.  Eyes:     Extraocular Movements: Extraocular movements intact.     Conjunctiva/sclera: Conjunctivae normal.     Pupils: Pupils are equal, round, and reactive to light.  Cardiovascular:     Rate and Rhythm: Normal rate.     Pulses: Normal pulses.  Pulmonary:     Effort: Pulmonary effort is normal.     Breath sounds: Normal breath sounds.  Abdominal:     General: Abdomen is flat.     Palpations: Abdomen is soft.  Musculoskeletal:     Cervical back: Normal range of motion and neck supple.     Comments: Old surgical scars to right knee with decreased range of motion   Skin:    General: Skin is warm and dry.  Neurological:     General: No focal deficit present.     Mental Status: He is alert and oriented to person, place, and time.  Psychiatric:        Attention and Perception: Attention and perception normal.        Mood and Affect: Affect is angry.        Speech: Speech normal.        Behavior: Behavior is withdrawn.        Thought Content: Thought content includes suicidal ideation. Thought content includes suicidal plan.        Cognition and Memory: Cognition and memory normal.        Judgment: Judgment is impulsive.   Review of Systems  Constitutional: Negative.   HENT: Negative.    Eyes: Negative.   Respiratory: Negative.    Cardiovascular: Negative.   Gastrointestinal: Negative.   Genitourinary: Negative.   Musculoskeletal:  Positive for joint pain and myalgias.  Skin: Negative.   Neurological: Negative.   Endo/Heme/Allergies:  Positive for environmental allergies. Does not bruise/bleed easily.  Psychiatric/Behavioral:  Positive for depression, substance abuse and suicidal ideas. Negative for hallucinations and memory loss. The patient is  nervous/anxious. The patient does not have insomnia.   Blood pressure 117/80, pulse 74, temperature 98.2 F (36.8 C), temperature source Oral, resp. rate 17, height 5\' 10"  (1.778 m), weight 79.4 kg, SpO2 100 %. Body mass index is 25.11 kg/m.  Treatment Plan Summary: Daily contact with patient to assess and evaluate symptoms and progress in treatment and Medication management 45 year old male with bipolar 1 disorder current episode depressed, severe without psychotic features and polysubstance abuse presenting for suicidal ideations.  Continue Depakote 1000 mg nightly and Abilify 10 mg daily.  Will order PT consult to assess for need for assistive device given decreased mobility and pain of the right knee.  Patient desires residential substance abuse treatment at discharge.  Observation Level/Precautions:  15 minute checks  Laboratory:   lipid panel. Hemoglobin a1c completed last month 5.5  Psychotherapy:    Medications:    Consultations:    Discharge Concerns:    Estimated LOS:  Other:     Physician Treatment Plan for Primary Diagnosis: Bipolar disorder with severe depression (HCC) Long Term Goal(s): Improvement in symptoms so as ready for discharge  Short Term Goals: Ability to identify changes in lifestyle to reduce recurrence of condition will improve, Ability to verbalize feelings will improve, Ability to disclose and discuss suicidal ideas, Ability to demonstrate self-control will improve, Ability to identify and develop effective coping behaviors will improve, Ability to maintain clinical  measurements within normal limits will improve, Compliance with prescribed medications will improve, and Ability to identify triggers associated with substance abuse/mental health issues will improve  Physician Treatment Plan for Secondary Diagnosis: Principal Problem:   Bipolar disorder with severe depression (HCC) Active Problems:   Arthritis   Amphetamine abuse (HCC)  Long Term Goal(s):  Improvement in symptoms so as ready for discharge  Short Term Goals: Ability to identify changes in lifestyle to reduce recurrence of condition will improve, Ability to verbalize feelings will improve, Ability to disclose and discuss suicidal ideas, Ability to demonstrate self-control will improve, Ability to identify and develop effective coping behaviors will improve, Ability to maintain clinical measurements within normal limits will improve, Compliance with prescribed medications will improve, and Ability to identify triggers associated with substance abuse/mental health issues will improve  I certify that inpatient services furnished can reasonably be expected to improve the patient's condition.    Jesse SansMegan M Dajanee Voorheis, MD 7/12/202210:25 AM

## 2021-05-03 NOTE — [facility] Suicide Risk Assessment (Signed)
[facility] INPATIENT:  Family/Significant Other Suicide Prevention Education  Suicide Prevention Education:  Patient Refusal for Family/Significant Other Suicide Prevention Education: The patient Cory Floyd has refused to provide written consent for family/significant other to be provided Family/Significant Other Suicide Prevention Education during admission and/or prior to discharge.  Physician notified.  SPE completed with pt, as pt refused to consent to family contact. SPI pamphlet provided to pt and pt was encouraged to share information with support network, ask questions, and talk about any concerns relating to SPE. Pt denies access to guns/firearms and verbalized understanding of information provided. Mobile Crisis information also provided to pt.  Glenis Smoker 05/03/2021, 4:02 PM

## 2021-05-03 NOTE — Plan of Care (Signed)
Pt rates depression 8/10. Pt denies anxiety and HI. Pt says he had AVH "last night". Pt has passive SI with a plan but verbally contracts for safety. Torrie Mayers RN Problem: Education: Goal: Knowledge of [facility] Education information/materials will improve Outcome: Progressing Goal: Emotional status will improve Outcome: Progressing Goal: Mental status will improve Outcome: Progressing Goal: Verbalization of understanding the information provided will improve Outcome: Progressing   Problem: Activity: Goal: Interest or engagement in activities will improve Outcome: Progressing Goal: Sleeping patterns will improve Outcome: Progressing   Problem: Coping: Goal: Ability to verbalize frustrations and anger appropriately will improve Outcome: Progressing Goal: Ability to demonstrate self-control will improve Outcome: Progressing   Problem: Health Behavior/Discharge Planning: Goal: Identification of resources available to assist in meeting health care needs will improve Outcome: Progressing Goal: Compliance with treatment plan for underlying cause of condition will improve Outcome: Progressing   Problem: Physical Regulation: Goal: Ability to maintain clinical measurements within normal limits will improve Outcome: Progressing   Problem: Safety: Goal: Periods of time without injury will increase Outcome: Progressing   Problem: Education: Goal: Utilization of techniques to improve thought processes will improve Outcome: Progressing Goal: Knowledge of the prescribed therapeutic regimen will improve Outcome: Progressing   Problem: Activity: Goal: Interest or engagement in leisure activities will improve Outcome: Progressing Goal: Imbalance in normal sleep/wake cycle will improve Outcome: Progressing   Problem: Coping: Goal: Coping ability will improve Outcome: Progressing Goal: Will verbalize feelings Outcome: Progressing   Problem: Health Behavior/Discharge  Planning: Goal: Ability to make decisions will improve Outcome: Progressing Goal: Compliance with therapeutic regimen will improve Outcome: Progressing   Problem: Role Relationship: Goal: Will demonstrate positive changes in social behaviors and relationships Outcome: Progressing   Problem: Safety: Goal: Ability to disclose and discuss suicidal ideas will improve Outcome: Progressing Goal: Ability to identify and utilize support systems that promote safety will improve Outcome: Progressing   Problem: Self-Concept: Goal: Will verbalize positive feelings about self Outcome: Progressing Goal: Level of anxiety will decrease Outcome: Progressing

## 2021-05-03 NOTE — Progress Notes (Signed)
Patient calm and cooperative during assessment. Pt endorses passive SI, verbally contracts for safety. Patient isolated to his room this evening. Patient did come up for snack. Patient compliant with medication administration per MD orders. Pt given education, support, and encouragement to be active in his treatment plan. Patient being monitored Q 15 minutes for safety per unit protocol. Pt remains safe on the unit.  

## 2021-05-03 NOTE — Progress Notes (Signed)
Recreation Therapy Notes  Date: 05/03/2021  Time: 10:30 am   Location: Craft Room     Behavioral response: N/A   Intervention Topic: Self-esteem   Discussion/Intervention: Patient did not attend group.   Clinical Observations/Feedback:  Patient did not attend group.   Kariana Wiles LRT/CTRS         Trecia Maring 05/03/2021 12:02 PM

## 2021-05-03 NOTE — BHH Counselor (Signed)
Adult Comprehensive Assessment  Patient ID: Cory Floyd, male   DOB: Jul 22, 1976, 45 y.o.   MRN: [id number]  Information Source: Information source: Patient  Current Stressors:  Patient states their primary concerns and needs for treatment are:: "My mother is buried here.  I walked here to be able to [place] myself and I am okay with that." Patient states their goals for this hospitilization and ongoing recovery are:: "I don't even know" Educational / Learning stressors: Pt denies. Employment stressors: Pt denies. Family Relationships: "They're all deadEngineer, petroleum / Lack of resources (include bankruptcy): "I don't have any money" Housing / Lack of housing: "It's on and off" Physical health (include injuries & life threatening diseases):Pt reports knee pain.  "It's all in my legs" Substance abuse: Pt reports meth and cocaine use. Bereavement: Pt reports "my mother".  He reports that she passed "years ago"   Living/Environment/Situation:  Living Arrangements: Other (Comment) (Staying with friend) Living conditions (as described by patient or guardian): Pt reports that he was staying with a friend and is unsure if he can return. What is atmosphere in current home: Temporary   Family History:  Marital status: Single Does patient have children?: Yes How many children?: 2 How is patient's relationship with their children?: Pt reported "they're all grown" and reported relationship is "good when they speak to me".     Childhood History:  Additional childhood history information: Pt reports he was in and out the system Does patient have siblings?: 1 Number of siblings?:1 (sister) Description of patient's current relationship with siblings?: Pt reports "none". Did patient suffer any verbal/emotional/physical/sexual abuse as a child?: (Pt reported "I ain't discussing none of that")   Education:  Highest grade of school patient has completed: 12th Currently a student?: No Learning  disability?: (Pt reported "I don't know")   Employment/Work Situation:   Employment situation: On disability Why is patient on disability: mental health How long has patient been on disability: Unknown What is the longest time patient has a held a job?: Pt refused to answer Did You Receive Any Psychiatric Treatment/Services While in the [employer]?: No Are There Guns or Other Weapons in Your Home?: No Are These Weapons Safely Secured?: (N/A)   Financial Resources:   Financial resources: [employer] SSDI Does patient have a Lawyer or guardian?: No   Alcohol/Substance Abuse:   What has been your use of drugs/alcohol within the last 12 months?: Pt reports that he has not used in "a couple weeks"  Cocaine: daily, $40-50/week, smoke/snort; Meth "on the weekends, $40, smoking"   If attempted suicide, did drugs/alcohol play a role in this?: Yes If yes, describe treatment: "I ended up here" Has alcohol/substance abuse ever caused legal problems?: (Pt reported "not really"  Assessment from April 2022 indicates: Pt didn't answer, but court calendar shows pt had court 12/7 and scheduled today 12/8 for identity theft and traffic ticket)   Social Support System:   Describe Community Support System: None reported Type of faith/religion: "I'm religious"   Leisure/Recreation:   Leisure and Hobbies: "Not anymore"   Strengths/Needs:   What is the patient's perception of their strengths?: "Numbers"   Discharge Plan:   Currently receiving community mental health services: Yes (From Whom)([facility]) Patient states concerns and preferences for aftercare planning are: Pt reports that he would like to have long term residential treatment.  Patient reports he is unsure if he will continue with [facility].  Patient states they will know when they are  safe and ready for discharge when: "I don't know.  My plan is to be with my mom.  My goal is my goal."  Does patient have access  to transportation?: No Does patient have financial barriers related to discharge medications?: No Plan for no access to transportation at discharge: CSW will continue to assess Will patient be returning to same living situation after discharge?: Pt reports that he wants to go to long-term treatment, however, also states plans to commit suicide.  Pt reports plans are to commit suicide if he is not able to be accepted into a program.   Summary/Recommendations:   Summary and Recommendations (to be completed by the evaluator): Patient is a 45 year old single male from [place], [place] [facility] [place]).  He presents to the hospital for suicidal ideation and plan to commit suicide at his mother's grave.  Pt reports current stressors as lack of money, substance use and lack of family.  Patient reports that he left previous treatment at [facility] and left to stay with a friend. He reports that he is unsure if he can return to his friends home and if he wants to.  He reports that he is currently unemployed though he has disability and insurance.  He indicates that he is religious, though not to a specific denomination, he also reports that he does not have any supports at this time.  He has ACTT services currently, though he is unsure if he wants to continue with their services at this time. He reports that at this time he is looking for long-term residential treatment.  Harden Mo. 05/03/2021

## 2021-05-03 NOTE — BHH Group Notes (Signed)
LCSW Group Therapy Note     05/03/2021 1:56 PM     Type of Therapy/Topic:  Group Therapy:  Feelings about Diagnosis     Participation Level:  Did Not Attend     Description of Group:   This group will allow patients to explore their thoughts and feelings about diagnoses they have received. Patients will be guided to explore their level of understanding and acceptance of these diagnoses. Facilitator will encourage patients to process their thoughts and feelings about the reactions of others to their diagnosis and will guide patients in identifying ways to discuss their diagnosis with significant others in their lives. This group will be process-oriented, with patients participating in exploration of their own experiences, giving and receiving support, and processing challenge from other group members.        Therapeutic Goals:  1.    Patient will demonstrate understanding of diagnosis as evidenced by identifying two or more symptoms of the disorder  2.    Patient will be able to express two feelings regarding the diagnosis  3.    Patient will demonstrate their ability to communicate their needs through discussion and/or role play     Summary of Patient Progress: X  Therapeutic Modalities:   Cognitive Behavioral Therapy  Brief Therapy  Feelings Identification    Whitman Meinhardt [place], MSW, LCSW-A  05/03/2021 1:56 PM

## 2021-05-03 NOTE — Progress Notes (Signed)
Pt has been withdrawn all day. He is still has SI but verbally contracts for safety. Torrie Mayers RN

## 2021-05-04 ENCOUNTER — Encounter: Payer: Self-pay | Admitting: Psychiatry

## 2021-05-04 DIAGNOSIS — F314 Bipolar disorder, current episode depressed, severe, without psychotic features: Secondary | ICD-10-CM | POA: Diagnosis not present

## 2021-05-04 LAB — LIPID PANEL
Cholesterol: 195 mg/dL (ref 0–200)
HDL: 58 mg/dL (ref >40)
LDL Cholesterol: 122 mg/dL — ABNORMAL HIGH (ref 0–99)
Total CHOL/HDL Ratio: 3.4 RATIO
Triglycerides: 75 mg/dL (ref <150)
VLDL: 15 mg/dL (ref 0–40)

## 2021-05-04 MED ORDER — TRAZODONE HCL 50 MG PO TABS
150.0000 mg | ORAL_TABLET | Freq: Every evening | ORAL | Status: DC | PRN
  Administered 2021-05-04 – 2021-05-08 (×4): 150 mg via ORAL
  Filled 2021-05-04 (×4): qty 1

## 2021-05-04 NOTE — Tx Team (Addendum)
Interdisciplinary Treatment and Diagnostic Plan Update  05/04/2021 Time of Session: 9:00AM Cory Floyd MRN: [id number]  Principal Diagnosis: Bipolar disorder with severe depression [facility])  Secondary Diagnoses: Principal Problem:   Bipolar disorder with severe depression (Cory Floyd) Active Problems:   Arthritis   Amphetamine abuse (Cory Floyd)   Current Medications:  Current Facility-Administered Medications  Medication Dose Route Frequency Provider Last Rate Last Admin   acetaminophen (TYLENOL) tablet 650 mg  650 mg Oral Q6H PRN Clapacs, [name], MD       alum & mag hydroxide-simeth (MAALOX/MYLANTA) 200-200-20 MG/5ML suspension 30 mL  30 mL Oral Q4H PRN Clapacs, [name], MD       ARIPiprazole (ABILIFY) tablet 10 mg  10 mg Oral Daily Clapacs, [name], MD   10 mg at 05/04/21 0854   divalproex (DEPAKOTE ER) 24 hr tablet 1,000 mg  1,000 mg Oral QHS Clapacs, [name], MD   1,000 mg at 05/03/21 2122   hydrOXYzine (ATARAX/VISTARIL) tablet 25 mg  25 mg Oral TID PRN Clapacs, [name], MD   25 mg at 05/04/21 0915   ibuprofen (ADVIL) tablet 800 mg  800 mg Oral Q6H PRN Clapacs, [name], MD       lidocaine (LMX) 4 % cream   Topical TID PRN Salley Scarlet, MD       magnesium hydroxide (MILK OF MAGNESIA) suspension 30 mL  30 mL Oral Daily PRN Clapacs, [name], MD       mirtazapine (REMERON) tablet 15 mg  15 mg Oral QHS Clapacs, [name], MD   15 mg at 05/03/21 2122   traZODone (DESYREL) tablet 100 mg  100 mg Oral QHS PRN Clapacs, [name], MD   100 mg at 05/03/21 2122   PTA Medications: Medications Prior to Admission  Medication Sig Dispense Refill Last Dose   ARIPiprazole (ABILIFY) 10 MG tablet Take 1 tablet (10 mg total) by mouth daily. For mood control 30 tablet 0    divalproex (DEPAKOTE ER) 500 MG 24 hr tablet Take 2 tablets (1,000 mg total) by mouth at bedtime. For mood stabilization 60 tablet 0    hydrOXYzine (ATARAX/VISTARIL) 25 MG tablet Take 1 tablet (25 mg total) by mouth 3 (three) times daily as needed for  anxiety. 75 tablet 0    mirtazapine (REMERON) 15 MG tablet Take 1 tablet (15 mg total) by mouth at bedtime. For depression/sleep 30 tablet 0    traZODone (DESYREL) 100 MG tablet Take 1 tablet (100 mg total) by mouth at bedtime as needed for sleep. 30 tablet 0     Patient Stressors: Substance abuse  Patient Strengths: Ability for insight Average or above average intelligence Capable of independent living Communication skills  Treatment Modalities: Medication Management, Group therapy, Case management,  1 to 1 session with clinician, Psychoeducation, Recreational therapy.   Physician Treatment Plan for Primary Diagnosis: Bipolar disorder with severe depression (Cory Floyd) Long Term Goal(s): Improvement in symptoms so as ready for discharge   Short Term Goals: Ability to identify changes in lifestyle to reduce recurrence of condition will improve Ability to verbalize feelings will improve Ability to disclose and discuss suicidal ideas Ability to demonstrate self-control will improve Ability to identify and develop effective coping behaviors will improve Ability to maintain clinical measurements within normal limits will improve Compliance with prescribed medications will improve Ability to identify triggers associated with substance abuse/mental health issues will improve  Medication Management: Evaluate patient's response, side effects, and tolerance of medication regimen.  Therapeutic Interventions: 1  to 1 sessions, Unit Group sessions and Medication administration.  Evaluation of Outcomes: Not Met  Physician Treatment Plan for Secondary Diagnosis: Principal Problem:   Bipolar disorder with severe depression (Cory Floyd) Active Problems:   Arthritis   Amphetamine abuse ([place])  Long Term Goal(s): Improvement in symptoms so as ready for discharge   Short Term Goals: Ability to identify changes in lifestyle to reduce recurrence of condition will improve Ability to verbalize feelings will  improve Ability to disclose and discuss suicidal ideas Ability to demonstrate self-control will improve Ability to identify and develop effective coping behaviors will improve Ability to maintain clinical measurements within normal limits will improve Compliance with prescribed medications will improve Ability to identify triggers associated with substance abuse/mental health issues will improve     Medication Management: Evaluate patient's response, side effects, and tolerance of medication regimen.  Therapeutic Interventions: 1 to 1 sessions, Unit Group sessions and Medication administration.  Evaluation of Outcomes: Not Met   RN Treatment Plan for Primary Diagnosis: Bipolar disorder with severe depression (Cory Floyd) Long Term Goal(s): Knowledge of disease and therapeutic regimen to maintain health will improve  Short Term Goals: Ability to remain free from injury will improve, Ability to verbalize frustration and anger appropriately will improve, Ability to demonstrate self-control, Ability to participate in decision making will improve, Ability to verbalize feelings will improve, Ability to disclose and discuss suicidal ideas, Ability to identify and develop effective coping behaviors will improve, and Compliance with prescribed medications will improve  Medication Management: RN will administer medications as ordered by provider, will assess and evaluate patient's response and provide education to patient for prescribed medication. RN will report any adverse and/or side effects to prescribing provider.  Therapeutic Interventions: 1 on 1 counseling sessions, Psychoeducation, Medication administration, Evaluate responses to treatment, Monitor vital signs and CBGs as ordered, Perform/monitor CIWA, COWS, AIMS and Fall Risk screenings as ordered, Perform wound care treatments as ordered.  Evaluation of Outcomes: Not Met   LCSW Treatment Plan for Primary Diagnosis: Bipolar disorder with severe  depression (Cory Floyd) Long Term Goal(s): Safe transition to appropriate next level of care at discharge, Engage patient in therapeutic group addressing interpersonal concerns.  Short Term Goals: Engage patient in aftercare planning with referrals and resources, Increase social support, Increase ability to appropriately verbalize feelings, Increase emotional regulation, Facilitate patient progression through stages of change regarding substance use diagnoses and concerns, Identify triggers associated with mental health/substance abuse issues, and Increase skills for wellness and recovery  Therapeutic Interventions: Assess for all discharge needs, 1 to 1 time with Social worker, Explore available resources and support systems, Assess for adequacy in community support network, Educate family and significant other(s) on suicide prevention, Complete Psychosocial Assessment, Interpersonal group therapy.  Evaluation of Outcomes: Not Met   Progress in Treatment: Attending groups: No. Participating in groups: No. Taking medication as prescribed: Yes. Toleration medication: Yes. Family/Significant other contact made: No, will contact:  when given permission. Patient understands diagnosis: Yes. Discussing patient identified problems/goals with staff: No. Medical problems stabilized or resolved: Yes. Denies suicidal/homicidal ideation: Yes. Issues/concerns per patient self-inventory: No. Other: none.  New problem(s) identified: No, Describe:  none.  New Short Term/Long Term Goal(s): detox, medication management for mood stabilization; elimination of SI thoughts; development of comprehensive mental wellness/sobriety plan.  Patient Goals: "I only came here to make a phone call because I told my friend I would give it another shot."   Discharge Plan or Barriers: CSW will assist pt with development of an appropriate  aftercare/discharge plan.  Reason for Continuation of Hospitalization: Medication  stabilization Suicidal ideation  Estimated Length of Stay: 1-7 days  Recreational Therapy: Patient Stressors: N/A Patient Goal: Patient will engage in groups without prompting or encouragement from LRT x3 group sessions within 5 recreation therapy group sessions.  Attendees: Patient: Cory Floyd 05/04/2021 9:44 AM  Physician: Selina Cooley, MD 05/04/2021 9:44 AM  Nursing: West Pugh, RN 05/04/2021 9:44 AM  RN Care Manager: 05/04/2021 9:44 AM  Social Worker: Chalmers Guest. Guerry Bruin, MSW, [place], [place] 05/04/2021 9:44 AM  Recreational Therapist: Devin Going, LRT  05/04/2021 9:44 AM  Other: Assunta Curtis, MSW, LCSW 05/04/2021 9:44 AM  Other: Kiva Martin[place], MSW, LCSW-A 05/04/2021 9:44 AM  Other: Waldon Merl, NP 05/04/2021 9:44 AM    Scribe for Treatment Team: Shirl Harris, LCSW 05/04/2021 9:44 AM

## 2021-05-04 NOTE — Plan of Care (Signed)
Patient stays in bed most of the day.When asked for suicidal ideations patient states " I made my plans and I am not going to change it." Patient contracts for safety in the hospital. When talked about the residential treatment program patient states " no one is going to take a sex offender." Patient appears depressed and hopeless. Patient out in the milieu at this time. Support and encouragement given.

## 2021-05-04 NOTE — Progress Notes (Signed)
Patient calm and cooperative during assessment. Pt endorses passive SI, verbally contracts for safety. Patient isolated to his room this evening. Patient did come up for snack. Patient compliant with medication administration per MD orders. Pt given education, support, and encouragement to be active in his treatment plan. Patient being monitored Q 15 minutes for safety per unit protocol. Pt remains safe on the unit.

## 2021-05-04 NOTE — Progress Notes (Signed)
Recreation Therapy Notes   Date: 05/04/2021  Time: 10:15 am   Location: Craft Room     Behavioral response: N/A   Intervention Topic: Goals    Discussion/Intervention: Patient did not attend group.   Clinical Observations/Feedback:  Patient did not attend group.   Chanc Kervin LRT/CTRS         Hawkins Seaman 05/04/2021 12:38 PM

## 2021-05-04 NOTE — BHH Counselor (Signed)
Adult Comprehensive Assessment  Patient ID: Cory Floyd, male   DOB: Jul 22, 1976, 45 y.o.   MRN: [id number]  Information Source: Information source: Patient  Current Stressors:  Patient states their primary concerns and needs for treatment are:: "My mother is buried here.  I walked here to be able to [place] myself and I am okay with that." Patient states their goals for this hospitilization and ongoing recovery are:: "I don't even know" Educational / Learning stressors: Pt denies. Employment stressors: Pt denies. Family Relationships: "They're all deadEngineer, petroleum / Lack of resources (include bankruptcy): "I don't have any money" Housing / Lack of housing: "It's on and off" Physical health (include injuries & life threatening diseases):Pt reports knee pain.  "It's all in my legs" Substance abuse: Pt reports meth and cocaine use. Bereavement: Pt reports "my mother".  He reports that she passed "years ago"   Living/Environment/Situation:  Living Arrangements: Other (Comment) (Staying with friend) Living conditions (as described by patient or guardian): Pt reports that he was staying with a friend and is unsure if he can return. What is atmosphere in current home: Temporary   Family History:  Marital status: Single Does patient have children?: Yes How many children?: 2 How is patient's relationship with their children?: Pt reported "they're all grown" and reported relationship is "good when they speak to me".     Childhood History:  Additional childhood history information: Pt reports he was in and out the system Does patient have siblings?: 1 Number of siblings?:1 (sister) Description of patient's current relationship with siblings?: Pt reports "none". Did patient suffer any verbal/emotional/physical/sexual abuse as a child?: (Pt reported "I ain't discussing none of that")   Education:  Highest grade of school patient has completed: 12th Currently a student?: No Learning  disability?: (Pt reported "I don't know")   Employment/Work Situation:   Employment situation: On disability Why is patient on disability: mental health How long has patient been on disability: Unknown What is the longest time patient has a held a job?: Pt refused to answer Did You Receive Any Psychiatric Treatment/Services While in the [employer]?: No Are There Guns or Other Weapons in Your Home?: No Are These Weapons Safely Secured?: (N/A)   Financial Resources:   Financial resources: [employer] SSDI Does patient have a Lawyer or guardian?: No   Alcohol/Substance Abuse:   What has been your use of drugs/alcohol within the last 12 months?: Pt reports that he has not used in "a couple weeks"  Cocaine: daily, $40-50/week, smoke/snort; Meth "on the weekends, $40, smoking"   If attempted suicide, did drugs/alcohol play a role in this?: Yes If yes, describe treatment: "I ended up here" Has alcohol/substance abuse ever caused legal problems?: (Pt reported "not really"  Assessment from April 2022 indicates: Pt didn't answer, but court calendar shows pt had court 12/7 and scheduled today 12/8 for identity theft and traffic ticket)   Social Support System:   Describe Community Support System: None reported Type of faith/religion: "I'm religious"   Leisure/Recreation:   Leisure and Hobbies: "Not anymore"   Strengths/Needs:   What is the patient's perception of their strengths?: "Numbers"   Discharge Plan:   Currently receiving community mental health services: Yes (From Whom)([facility]) Patient states concerns and preferences for aftercare planning are: Pt reports that he would like to have long term residential treatment.  Patient reports he is unsure if he will continue with [facility].  Patient states they will know when they are  safe and ready for discharge when: "I don't know.  My plan is to be with my mom.  My goal is my goal."  Does patient have access  to transportation?: No Does patient have financial barriers related to discharge medications?: No Plan for no access to transportation at discharge: CSW will continue to assess Will patient be returning to same living situation after discharge?: Pt reports that he wants to go to long-term treatment, however, also states plans to commit suicide.  Pt reports plans are to commit suicide if he is not able to be accepted into a program.   Summary/Recommendations:   Summary and Recommendations (to be completed by the evaluator): Patient is a 45 year old single male from [place], [place] [facility] [place]).  He presents to the hospital for suicidal ideation and plan to commit suicide at his mother's grave.  Pt reports current stressors as lack of money, substance use and lack of family.  Patient reports that he left previous treatment at [facility] and left to stay with a friend. He reports that he is unsure if he can return to his friends home and if he wants to.  He reports that he is currently unemployed though he has disability and insurance.  He indicates that he is religious, though not to a specific denomination, he also reports that he does not have any supports at this time.  He has ACTT services currently, though he is unsure if he wants to continue with their services at this time. He reports that at this time he is looking for long-term residential treatment.  Recommendations include crisis stabilization, therapeutic milieu, encourage group attendance and participation, medication management for mood stabilization, and development of comprehensive mental wellness plan.  Harden Mo. 05/04/2021

## 2021-05-04 NOTE — BHH Counselor (Signed)
CSW provied the patient with information on the following: [facility] [employer] [facility]  CSW expressed the importance of patient reviewing the information and deciding on several treatment options so that CSW could assist with referrals.  Patient has identified that he would like long-term residential treatment.  Penni Homans, MSW, LCSW 05/04/2021 1:55 PM

## 2021-05-04 NOTE — Progress Notes (Signed)
Recreation Therapy Notes  INPATIENT RECREATION TR PLAN  Patient Details Name: Cory Floyd MRN: [id number] DOB: Jul 20, 1976 Today's Date: 05/04/2021  Rec Therapy Plan Is patient appropriate for Therapeutic Recreation?: Yes Treatment times per week: at least 3 Estimated Length of Stay: 5-7 days TR Treatment/Interventions: Group participation (Comment)  Discharge Criteria Pt will be discharged from therapy if:: Discharged Treatment plan/goals/alternatives discussed and agreed upon by:: Patient/family  Discharge Summary     Lolah Coghlan 05/04/2021, 3:29 PM

## 2021-05-04 NOTE — Plan of Care (Signed)
Patient endorses passive SI, verbally contracts for safety   Problem: Education: Goal: Emotional status will improve Outcome: Not Progressing Goal: Mental status will improve Outcome: Not Progressing   

## 2021-05-04 NOTE — B[facility] Group Notes (Signed)
LCSW Group Therapy Note  05/04/2021 12:38 PM  Type of Therapy/Topic:  Group Therapy:  Emotion Regulation  Participation Level:  Did Not Attend   Description of Group:   The purpose of this group is to assist patients in learning to regulate negative emotions and experience positive emotions. Patients will be guided to discuss ways in which they have been vulnerable to their negative emotions. These vulnerabilities will be juxtaposed with experiences of positive emotions or situations, and patients will be challenged to use positive emotions to combat negative ones. Special emphasis will be placed on coping with negative emotions in conflict situations, and patients will process healthy conflict resolution skills.  Therapeutic Goals: Patient will identify two positive emotions or experiences to reflect on in order to balance out negative emotions Patient will label two or more emotions that they find the most difficult to experience Patient will demonstrate positive conflict resolution skills through discussion and/or role plays  Summary of Patient Progress:  X     Therapeutic Modalities:   Cognitive Behavioral Therapy Feelings Identification Dialectical Behavioral Therapy  Penni Homans, MSW, LCSW 05/04/2021 12:38 PM

## 2021-05-04 NOTE — Progress Notes (Signed)
[facility] MD Progress Note  05/04/2021 11:05 AM Cory Floyd  MRN:  [id number]  CC "No one has seen me or talked to me."  Subjective:  45 year old male with history of bipolar disorder and polysubstance abuse presenting to emergency room for suicidal ideations.  Patient seen during treatment team and again one-on-one.  He is extremely irritable and hostile on exam.  He continues to insist that no one has seen him or talk to him despite being evaluated by myself, the social worker, and PT yesterday.  He states his goal is still to go commit suicide by his mother's grave.  He denies wanting anything else.  When seen one-on-one we did talk about how yesterday he mention wanting residential substance abuse treatment.  He agrees that this is what he would like for his plan of care.  He also brings up that he is having trouble sleeping at night and requests trazodone.  We will increase his dose of trazodone tonight.  He denies any homicidal ideations, visual hallucinations, auditory hallucinations. Principal Problem: Bipolar disorder with severe depression (HCC) Diagnosis: Principal Problem:   Bipolar disorder with severe depression (HCC) Active Problems:   Arthritis   Amphetamine abuse (HCC)  Total Time spent with patient: 45 minutes  Past Psychiatric History: See H&P  Past Medical History:  Past Medical History:  Diagnosis Date   Alcoholic (HCC)    clean for 3 years   Anginal pain (HCC)    Arthritis    Bipolar 1 disorder (HCC)    COVID-19    Depressed    Diabetes mellitus without complication (HCC)    Dyspnea    Fatty liver    Hypertension    Schizophrenia (HCC)     Past Surgical History:  Procedure Laterality Date   COLONOSCOPY  02/05/2014   diverticulosis, hyperplastic polyp x 2   fracture arm Right    KNEE SURGERY Right 2013   NASAL SEPTUM SURGERY     NASAL TURBINATE REDUCTION Bilateral 12/12/2016   Procedure: TURBINATE REDUCTION/SUBMUCOSAL RESECTION;  Surgeon: Linus Salmons,  MD;  Location: [facility] ORS;  Service: ENT;  Laterality: Bilateral;   Family History:  Family History  Problem Relation Age of Onset   Hepatitis C Mother    Cancer Mother    Cancer Maternal Aunt    Hypertension Maternal Grandmother    Family Psychiatric  History: See H&P Social History:  Social History   Substance and Sexual Activity  Alcohol Use Yes   Comment: 1/2-5th pint of liquor     Social History   Substance and Sexual Activity  Drug Use Yes   Types: Cocaine, Marijuana   Comment: heroin    Social History   Socioeconomic History   Marital status: Divorced    Spouse name: Not on file   Number of children: Not on file   Years of education: Not on file   Highest education level: Not on file  Occupational History   Not on file  Tobacco Use   Smoking status: Every Day    Packs/day: 1.00    Years: 20.00    Pack years: 20.00    Types: Cigarettes   Smokeless tobacco: Never  Vaping Use   Vaping Use: Never used  Substance and Sexual Activity   Alcohol use: Yes    Comment: 1/2-5th pint of liquor   Drug use: Yes    Types: Cocaine, Marijuana    Comment: heroin   Sexual activity: Yes    Birth control/protection: None  Other  Topics Concern   Not on file  Social History Narrative   Not on file   Social Determinants of Health   Financial Resource Strain: Not on file  Food Insecurity: Not on file  Transportation Needs: Not on file  Physical Activity: Not on file  Stress: Not on file  Social Connections: Not on file   Additional Social History:                         Sleep: Poor  Appetite:  Fair  Current Medications: Current Facility-Administered Medications  Medication Dose Route Frequency Provider Last Rate Last Admin   acetaminophen (TYLENOL) tablet 650 mg  650 mg Oral Q6H PRN Clapacs, Jackquline Denmark, MD       alum & mag hydroxide-simeth (MAALOX/MYLANTA) 200-200-20 MG/5ML suspension 30 mL  30 mL Oral Q4H PRN Clapacs, Jackquline Denmark, MD       ARIPiprazole  (ABILIFY) tablet 10 mg  10 mg Oral Daily Clapacs, Jackquline Denmark, MD   10 mg at 05/04/21 0854   divalproex (DEPAKOTE ER) 24 hr tablet 1,000 mg  1,000 mg Oral QHS Clapacs, [name], MD   1,000 mg at 05/03/21 2122   hydrOXYzine (ATARAX/VISTARIL) tablet 25 mg  25 mg Oral TID PRN Clapacs, Jackquline Denmark, MD   25 mg at 05/04/21 0915   ibuprofen (ADVIL) tablet 800 mg  800 mg Oral Q6H PRN Clapacs, Jackquline Denmark, MD       lidocaine (LMX) 4 % cream   Topical TID PRN Jesse Sans, MD       magnesium hydroxide (MILK OF MAGNESIA) suspension 30 mL  30 mL Oral Daily PRN Clapacs, Jackquline Denmark, MD       mirtazapine (REMERON) tablet 15 mg  15 mg Oral QHS Clapacs, [name], MD   15 mg at 05/03/21 2122   traZODone (DESYREL) tablet 150 mg  150 mg Oral QHS PRN Jesse Sans, MD        Lab Results:  Results for orders placed or performed during the hospital encounter of 05/02/21 (from the past 48 hour(s))  Lipid panel     Status: Abnormal   Collection Time: 05/04/21  7:15 AM  Result Value Ref Range   Cholesterol 195 0 - 200 mg/dL   Triglycerides 75 <542 mg/dL   HDL 58 >70 mg/dL   Total CHOL/HDL Ratio 3.4 RATIO   VLDL 15 0 - 40 mg/dL   LDL Cholesterol 623 (H) 0 - 99 mg/dL    Comment:        Total Cholesterol/HDL:CHD Risk Coronary Heart Disease Risk Table                     Men   Women  1/2 Average Risk   3.4   3.3  Average Risk       5.0   4.4  2 X Average Risk   9.6   7.1  3 X Average Risk  23.4   11.0        Use the calculated Patient Ratio above and the CHD Risk Table to determine the patient's CHD Risk.        ATP III CLASSIFICATION (LDL):  <100     mg/dL   Optimal  762-831  mg/dL   Near or Above                    Optimal  130-159  mg/dL   Borderline  517-616  mg/dL   High  >308     mg/dL   Very High Performed at [facility], [address] [place], [place] [address]     Blood Alcohol level:  Lab Results  Component Value Date   [facility] <10 05/01/2021   ETH <10 04/01/2021    Metabolic Disorder  Labs: Lab Results  Component Value Date   HGBA1C 5.5 04/02/2021   MPG 111 04/02/2021   MPG 119.76 01/23/2021   No results found for: PROLACTIN Lab Results  Component Value Date   CHOL 195 05/04/2021   TRIG 75 05/04/2021   HDL 58 05/04/2021   CHOLHDL 3.4 05/04/2021   VLDL 15 05/04/2021   LDLCALC 122 (H) 05/04/2021   LDLCALC 81 04/02/2021    Physical Findings: AIMS:  , ,  ,  ,    CIWA:    COWS:     Musculoskeletal: Strength & Muscle Tone: within normal limits Gait & Station: normal Patient leans: N/A  Psychiatric Specialty Exam:  Presentation  General Appearance: Disheveled  Eye Contact:Minimal  Speech:Normal Rate  Speech Volume:Normal  Handedness:Right   Mood and Affect  Mood:Irritable; Angry  Affect:Congruent   Thought Process  Thought Processes:Goal Directed  Descriptions of Associations:Intact  Orientation:Full (Time, Place and Person)  Thought Content:Rumination  History of Schizophrenia/Schizoaffective disorder:No  Duration of Psychotic Symptoms:Less than six months  Hallucinations:Hallucinations: None  Ideas of Reference:None  Suicidal Thoughts:Suicidal Thoughts: Yes, Active SI Active Intent and/or Plan: With Intent; With Plan; With Means to Carry Out  Homicidal Thoughts:Homicidal Thoughts: No   Sensorium  Memory:Immediate Fair; Recent Fair; Remote Fair  Judgment:Impaired  Insight:Shallow   Executive Functions  Concentration:Fair  Attention Span:Fair  Recall:Fair  Fund of Knowledge:Fair  Language:Fair   Psychomotor Activity  Psychomotor Activity:Psychomotor Activity: Decreased   Assets  Assets:Desire for Improvement; Manufacturing systems engineer; Financial Resources/Insurance; Physical Health; Resilience; Social Support   Sleep  Sleep:Sleep: Fair Number of Hours of Sleep: 7.25    Physical Exam: Physical Exam ROS Blood pressure 117/80, pulse 74, temperature 98.2 F (36.8 C), temperature source Oral, resp. rate  17, height 5\' 10"  (1.778 m), weight 79.4 kg, SpO2 100 %. Body mass index is 25.11 kg/m.   Treatment Plan Summary: Daily contact with patient to assess and evaluate symptoms and progress in treatment and Medication management29 year old male with bipolar 1 disorder current episode depressed, severe without psychotic features and polysubstance abuse presenting for suicidal ideations.  Continue Depakote 1000 mg nightly and Abilify 10 mg daily.  PT evaluation completed.  They did not recommend any equipment or PT follow-up inpatient or outpatient.  Patient desires residential substance abuse treatment at discharge.  55, MD 05/04/2021, 11:05 AM

## 2021-05-04 NOTE — Progress Notes (Signed)
Recreation Therapy Notes  INPATIENT RECREATION THERAPY ASSESSMENT  Patient Details Name: Cory Floyd MRN: [id number] DOB: 1976/09/08 Today's Date: 05/04/2021       Information Obtained From: Patient  Able to Participate in Assessment/Interview: Yes  Patient Presentation: Responsive, Withdrawn, Resistant  Reason for Admission (Per Patient): Active Symptoms  Patient Stressors: Death  Coping Skills:   Substance Abuse  Leisure Interests (2+):   (Nothing)  Frequency of Recreation/Participation:    Awareness of Community Resources:  No  Community Resources:     Current Use:    If no, Barriers?:    Expressed Interest in [employer] Information: No  County of Residence:  [place]  Patient Main Form of Transportation: Therapist, music  Patient Strengths:  N/A  Patient Identified Areas of Improvement:  N/A  Patient Goal for Hospitalization:  To be with my mom  Current SI (including self-harm):  Yes (No plan)  Current HI:  No  Current AVH: No  Staff Intervention Plan: Group Attendance, Collaborate with Interdisciplinary Treatment Team  Consent to Intern Participation: N/A  Kniyah Khun 05/04/2021, 3:27 PM

## 2021-05-05 DIAGNOSIS — F314 Bipolar disorder, current episode depressed, severe, without psychotic features: Secondary | ICD-10-CM | POA: Diagnosis not present

## 2021-05-05 MED ORDER — ARIPIPRAZOLE ER 400 MG IM SRER: 400.0000 mg | INTRAMUSCULAR | Status: DC

## 2021-05-05 MED ORDER — ARIPIPRAZOLE ER 400 MG IM SRER
400.0000 mg | INTRAMUSCULAR | Status: DC
  Administered 2021-05-05: 400 mg via INTRAMUSCULAR
  Filled 2021-05-05: qty 2

## 2021-05-05 NOTE — Progress Notes (Signed)
[facility] MD Progress Note  05/05/2021 11:41 AM Cory Floyd  MRN:  [id number]  CC "The same"  Subjective:  45 year old male with history of bipolar disorder and polysubstance abuse presenting to emergency room for suicidal ideations.  Patient seen one-on-one again today.  He notes he had trouble falling asleep last night, but once he did fall asleep he was able to stay asleep.  He notes that that is the only improvement he is seen thus far.  He notes he still feels irritable still feels suicidal with plan to overdose on his mother's grave.  Denies any auditory or visual hallucinations or homicidal ideations.  Subjectively patient is less irritable on exam today.  He notes he looked over several of the residential substance abuse treatments the social work team provided yesterday.  He notes he is a sex offender and he is not sure if he is able to attend these treatment programs.  CSW team alerted to sex offender status that they can inquire about his ability to attend programs.  He does note that he found a place in [place] that would accept him but will not be able to get there until he gets his disability check on the 28th.  Today he does request a long-acting injectable.  Abilify maintainer 400 mg IM administered. Principal Problem: Bipolar disorder with severe depression (HCC) Diagnosis: Principal Problem:   Bipolar disorder with severe depression (HCC) Active Problems:   Arthritis   Amphetamine abuse (HCC)  Total Time spent with patient: 30 minutes  Past Psychiatric History: See H&P  Past Medical History:  Past Medical History:  Diagnosis Date   Alcoholic (HCC)    clean for 3 years   Anginal pain (HCC)    Arthritis    Bipolar 1 disorder (HCC)    COVID-19    Depressed    Diabetes mellitus without complication (HCC)    Dyspnea    Fatty liver    Hypertension    Schizophrenia (HCC)     Past Surgical History:  Procedure Laterality Date   COLONOSCOPY  02/05/2014   diverticulosis,  hyperplastic polyp x 2   fracture arm Right    KNEE SURGERY Right 2013   NASAL SEPTUM SURGERY     NASAL TURBINATE REDUCTION Bilateral 12/12/2016   Procedure: TURBINATE REDUCTION/SUBMUCOSAL RESECTION;  Surgeon: Linus Salmons, MD;  Location: [facility] ORS;  Service: ENT;  Laterality: Bilateral;   Family History:  Family History  Problem Relation Age of Onset   Hepatitis C Mother    Cancer Mother    Cancer Maternal Aunt    Hypertension Maternal Grandmother    Family Psychiatric  History: See H&P Social History:  Social History   Substance and Sexual Activity  Alcohol Use Yes   Comment: 1/2-5th pint of liquor     Social History   Substance and Sexual Activity  Drug Use Yes   Types: Cocaine, Marijuana   Comment: heroin    Social History   Socioeconomic History   Marital status: Divorced    Spouse name: Not on file   Number of children: Not on file   Years of education: Not on file   Highest education level: Not on file  Occupational History   Not on file  Tobacco Use   Smoking status: Every Day    Packs/day: 1.00    Years: 20.00    Pack years: 20.00    Types: Cigarettes   Smokeless tobacco: Never  Vaping Use   Vaping Use: Never used  Substance and Sexual Activity   Alcohol use: Yes    Comment: 1/2-5th pint of liquor   Drug use: Yes    Types: Cocaine, Marijuana    Comment: heroin   Sexual activity: Yes    Birth control/protection: None  Other Topics Concern   Not on file  Social History Narrative   Not on file   Social Determinants of Health   Financial Resource Strain: Not on file  Food Insecurity: Not on file  Transportation Needs: Not on file  Physical Activity: Not on file  Stress: Not on file  Social Connections: Not on file   Additional Social History:      Sleep: Poor  Appetite:  Fair  Current Medications: Current Facility-Administered Medications  Medication Dose Route Frequency Provider Last Rate Last Admin   acetaminophen (TYLENOL)  tablet 650 mg  650 mg Oral Q6H PRN Clapacs, Jackquline Denmark, MD       alum & mag hydroxide-simeth (MAALOX/MYLANTA) 200-200-20 MG/5ML suspension 30 mL  30 mL Oral Q4H PRN Clapacs, Jackquline Denmark, MD       ARIPiprazole (ABILIFY) tablet 10 mg  10 mg Oral Daily Clapacs, [name], MD   10 mg at 05/05/21 0739   ARIPiprazole ER (ABILIFY MAINTENA) injection 400 mg  400 mg Intramuscular Q28 days Jesse Sans, MD   400 mg at 05/05/21 1121   divalproex (DEPAKOTE ER) 24 hr tablet 1,000 mg  1,000 mg Oral QHS Clapacs, [name], MD   1,000 mg at 05/04/21 2100   hydrOXYzine (ATARAX/VISTARIL) tablet 25 mg  25 mg Oral TID PRN Clapacs, Jackquline Denmark, MD   25 mg at 05/04/21 0915   ibuprofen (ADVIL) tablet 800 mg  800 mg Oral Q6H PRN Clapacs, Jackquline Denmark, MD       lidocaine (LMX) 4 % cream   Topical TID PRN Jesse Sans, MD       magnesium hydroxide (MILK OF MAGNESIA) suspension 30 mL  30 mL Oral Daily PRN Clapacs, Jackquline Denmark, MD       mirtazapine (REMERON) tablet 15 mg  15 mg Oral QHS Clapacs, [name], MD   15 mg at 05/04/21 2100   traZODone (DESYREL) tablet 150 mg  150 mg Oral QHS PRN Jesse Sans, MD   150 mg at 05/04/21 2100    Lab Results:  Results for orders placed or performed during the hospital encounter of 05/02/21 (from the past 48 hour(s))  Lipid panel     Status: Abnormal   Collection Time: 05/04/21  7:15 AM  Result Value Ref Range   Cholesterol 195 0 - 200 mg/dL   Triglycerides 75 <034 mg/dL   HDL 58 >74 mg/dL   Total CHOL/HDL Ratio 3.4 RATIO   VLDL 15 0 - 40 mg/dL   LDL Cholesterol 259 (H) 0 - 99 mg/dL    Comment:        Total Cholesterol/HDL:CHD Risk Coronary Heart Disease Risk Table                     Men   Women  1/2 Average Risk   3.4   3.3  Average Risk       5.0   4.4  2 X Average Risk   9.6   7.1  3 X Average Risk  23.4   11.0        Use the calculated Patient Ratio above and the CHD Risk Table to determine the patient's CHD Risk.  ATP III CLASSIFICATION (LDL):  <100     mg/dL   Optimal  174-944   mg/dL   Near or Above                    Optimal  130-159  mg/dL   Borderline  967-591  mg/dL   High  >638     mg/dL   Very High Performed at [facility], [address] [place], [place] [address]     Blood Alcohol level:  Lab Results  Component Value Date   [facility] <10 05/01/2021   ETH <10 04/01/2021    Metabolic Disorder Labs: Lab Results  Component Value Date   HGBA1C 5.5 04/02/2021   MPG 111 04/02/2021   MPG 119.76 01/23/2021   No results found for: PROLACTIN Lab Results  Component Value Date   CHOL 195 05/04/2021   TRIG 75 05/04/2021   HDL 58 05/04/2021   CHOLHDL 3.4 05/04/2021   VLDL 15 05/04/2021   LDLCALC 122 (H) 05/04/2021   LDLCALC 81 04/02/2021    Physical Findings: AIMS:  , ,  ,  ,    CIWA:    COWS:     Musculoskeletal: Strength & Muscle Tone: within normal limits Gait & Station: normal Patient leans: N/A  Psychiatric Specialty Exam:  Presentation  General Appearance: Casual  Eye Contact:Minimal  Speech:Normal Rate  Speech Volume:Normal  Handedness:Right   Mood and Affect  Mood:Dysphoric  Affect:Congruent   Thought Process  Thought Processes:Goal Directed  Descriptions of Associations:Intact  Orientation:Full (Time, Place and Person)  Thought Content:Rumination  History of Schizophrenia/Schizoaffective disorder:No  Duration of Psychotic Symptoms:Less than six months  Hallucinations:Hallucinations: None  Ideas of Reference:None  Suicidal Thoughts:Suicidal Thoughts: Yes, Active SI Active Intent and/or Plan: With Intent; With Plan  Homicidal Thoughts:Homicidal Thoughts: No   Sensorium  Memory:Immediate Fair; Recent Fair; Remote Fair  Judgment:Impaired  Insight:Shallow   Executive Functions  Concentration:Fair  Attention Span:Fair  Recall:Fair  Fund of Knowledge:Fair  Language:Fair   Psychomotor Activity  Psychomotor Activity:Psychomotor Activity: Normal   Assets  Assets:Communication  Skills; Desire for Improvement; Financial Resources/Insurance; Resilience   Sleep  Sleep:Sleep: Good Number of Hours of Sleep: 8.3    Physical Exam: Physical Exam ROS Blood pressure 117/80, pulse 74, temperature 98.2 F (36.8 C), temperature source Oral, resp. rate 17, height 5\' 10"  (1.778 m), weight 79.4 kg, SpO2 100 %. Body mass index is 25.11 kg/m.   Treatment Plan Summary: Daily contact with patient to assess and evaluate symptoms and progress in treatment and Medication management14 year old male with bipolar 1 disorder current episode depressed, severe without psychotic features and polysubstance abuse presenting for suicidal ideations.  Continue Depakote 1000 mg nightly and Abilify 10 mg daily.  Abilify Maintenna 400 mg IM LAI administered today. PT evaluation completed.  They did not recommend any equipment or PT follow-up inpatient or outpatient.  Patient desires residential substance abuse treatment at discharge.  55, MD 05/05/2021, 11:41 AM

## 2021-05-05 NOTE — BHH Group Notes (Signed)
LCSW Group Therapy Note  05/05/2021 2:08 PM  Type of Therapy/Topic:  Group Therapy:  Balance in Life  Participation Level:  Did Not Attend  Description of Group:    This group will address the concept of balance and how it feels and looks when one is unbalanced. Patients will be encouraged to process areas in their lives that are out of balance and identify reasons for remaining unbalanced. Facilitators will guide patients in utilizing problem-solving interventions to address and correct the stressor making their life unbalanced. Understanding and applying boundaries will be explored and addressed for obtaining and maintaining a balanced life. Patients will be encouraged to explore ways to assertively make their unbalanced needs known to significant others in their lives, using other group members and facilitator for support and feedback.  Therapeutic Goals: Patient will identify two or more emotions or situations they have that consume much of in their lives. Patient will identify signs/triggers that life has become out of balance:  Patient will identify two ways to set boundaries in order to achieve balance in their lives:  Patient will demonstrate ability to communicate their needs through discussion and/or role plays  Summary of Patient Progress: X  Therapeutic Modalities:   Cognitive Behavioral Therapy Solution-Focused Therapy Assertiveness Training  Simona Huh R. Algis Greenhouse, MSW, LCSW, LCAS 05/05/2021 2:08 PM

## 2021-05-05 NOTE — Progress Notes (Signed)
Recreation Therapy Notes   Date: 05/05/2021  Time: 10:00 am   Location: Courtyard     Behavioral response: N/A   Intervention Topic: Social Skills    Discussion/Intervention: Patient did not attend group.   Clinical Observations/Feedback:  Patient did not attend group.   Socorro Ebron LRT/CTRS        Marie Borowski 05/05/2021 12:09 PM 

## 2021-05-05 NOTE — BHH Counselor (Signed)
CSW spoke with patient in reference to his report to psychiatrist he was interested in a program called [facility] in [place].  CSW obtained a consent.  CSW looked into program and all information indicates that the program is for women and children.  Pt reports that the program is for men and women who have recently been released from prison.    CSW did find a similar program, however, asked pt to speak with nurse about getting access to his phone to verify that this is the correct location as the patient does not recall any further information at this time.  CSW will contact the program with the patient's permission.  Patient reports plans to remain hospitalized until the 28th when he gets his money on his card.    Penni Homans, MSW, LCSW 05/05/2021 3:42 PM

## 2021-05-05 NOTE — Progress Notes (Signed)
Patient calm and cooperative during assessment. Pt endorses passive SI, verbally contracts for safety. Patient isolated to his room this evening. Patient did come up for snack. Patient compliant with medication administration per MD orders. Pt given education, support, and encouragement to be active in his treatment plan. Patient being monitored Q 15 minutes for safety per unit protocol. Pt remains safe on the unit.  

## 2021-05-05 NOTE — Plan of Care (Signed)
Patient more active on the unit tonight, smiling and laughing with staff and peers   Problem: Education: Goal: Emotional status will improve Outcome: Progressing Goal: Mental status will improve Outcome: Progressing

## 2021-05-05 NOTE — Plan of Care (Signed)
Pt rates depression 7/10. Pt denies anxiety, SI, HI and AVH. Pt was educated on care plan and verbalizes understanding. Torrie Mayers RN Problem: Education: Goal: Knowledge of [facility] Education information/materials will improve Outcome: Progressing Goal: Emotional status will improve Outcome: Not Progressing Goal: Mental status will improve Outcome: Progressing Goal: Verbalization of understanding the information provided will improve Outcome: Progressing   Problem: Activity: Goal: Interest or engagement in activities will improve Outcome: Progressing Goal: Sleeping patterns will improve Outcome: Progressing   Problem: Coping: Goal: Ability to verbalize frustrations and anger appropriately will improve Outcome: Progressing Goal: Ability to demonstrate self-control will improve Outcome: Progressing   Problem: Health Behavior/Discharge Planning: Goal: Identification of resources available to assist in meeting health care needs will improve Outcome: Progressing Goal: Compliance with treatment plan for underlying cause of condition will improve Outcome: Progressing   Problem: Physical Regulation: Goal: Ability to maintain clinical measurements within normal limits will improve Outcome: Progressing   Problem: Safety: Goal: Periods of time without injury will increase Outcome: Progressing   Problem: Education: Goal: Utilization of techniques to improve thought processes will improve Outcome: Progressing Goal: Knowledge of the prescribed therapeutic regimen will improve Outcome: Progressing   Problem: Activity: Goal: Interest or engagement in leisure activities will improve Outcome: Progressing Goal: Imbalance in normal sleep/wake cycle will improve Outcome: Progressing   Problem: Coping: Goal: Coping ability will improve Outcome: Progressing Goal: Will verbalize feelings Outcome: Progressing   Problem: Health Behavior/Discharge Planning: Goal: Ability to  make decisions will improve Outcome: Progressing Goal: Compliance with therapeutic regimen will improve Outcome: Progressing   Problem: Role Relationship: Goal: Will demonstrate positive changes in social behaviors and relationships Outcome: Progressing   Problem: Safety: Goal: Ability to disclose and discuss suicidal ideas will improve Outcome: Progressing Goal: Ability to identify and utilize support systems that promote safety will improve Outcome: Progressing   Problem: Self-Concept: Goal: Will verbalize positive feelings about self Outcome: Progressing Goal: Level of anxiety will decrease Outcome: Progressing

## 2021-05-05 NOTE — Progress Notes (Signed)
Pt rates depression 5/10. Pt denies anxiety. SI, HI and AVH. Pt did not attend group. Pt has been more social this afternoon. Pt is med compliant with a flat affect. Torrie Mayers RN

## 2021-05-06 DIAGNOSIS — F314 Bipolar disorder, current episode depressed, severe, without psychotic features: Secondary | ICD-10-CM | POA: Diagnosis not present

## 2021-05-06 LAB — VALPROIC ACID LEVEL: Valproic Acid Lvl: 51 ug/mL (ref 50.0–100.0)

## 2021-05-06 NOTE — Progress Notes (Signed)
Pt is mainly withdrawn in his room. When he is assessed he becomes very irritated and annoyed. He is social at times with other patients. She has a grimaced, anxious affect. Torrie Mayers RN

## 2021-05-06 NOTE — BHH Counselor (Signed)
CSW met with the patient to discuss referring him to the [place] program.  Per discussion with [facility].  TOC can not refer the patient without speaking to his probation officer.  CSW spoke with patient about release to speak with probation and pt declined.    Pt aware that TOC is unable to speak without that consent.  Patient stated he was ok with this and will follow up on his own.  Patient seemed upset that CSW stated he was on probation.  Assunta Curtis, MSW, LCSW 05/06/2021 4:01 PM

## 2021-05-06 NOTE — Progress Notes (Signed)
[facility] MD Progress Note  05/06/2021 10:55 AM Cory Floyd  MRN:  [id number]  CC "Tired "  Subjective:  45 year old male with history of bipolar disorder and polysubstance abuse presenting to emergency room for suicidal ideations.  No acute events overnight, medication compliant, attending to ADLs.  Today patient notes that he is very tired.  He felt he cannot fall asleep or stay asleep last night.  He notes some arm soreness after receiving Abilify Maintena but denies any other medication side effects.  He continues to have active suicidal ideations with plan to overdose.  He denies any homicidal ideations, visual hallucinations, auditory hallucinations.  Objectively he is more future oriented.  He is booked out several substance abuse treatment options.  He states there is a program called [facility] in [place] that will accept him despite being on the sex offender registry.  He provides the number.  He does not have a way to get there until he does.  Later this month.  Working on alternative treatment plans in the meantime.  CSW team looking into whether he can get into any substance abuse treatment programs with a sex offender status here in [place]. Principal Problem: Bipolar disorder with severe depression (HCC) Diagnosis: Principal Problem:   Bipolar disorder with severe depression (HCC) Active Problems:   Arthritis   Amphetamine abuse (HCC)  Total Time spent with patient: 30 minutes  Past Psychiatric History: See H&P  Past Medical History:  Past Medical History:  Diagnosis Date   Alcoholic (HCC)    clean for 3 years   Anginal pain (HCC)    Arthritis    Bipolar 1 disorder (HCC)    COVID-19    Depressed    Diabetes mellitus without complication (HCC)    Dyspnea    Fatty liver    Hypertension    Schizophrenia (HCC)     Past Surgical History:  Procedure Laterality Date   COLONOSCOPY  02/05/2014   diverticulosis, hyperplastic polyp x 2   fracture arm Right    KNEE  SURGERY Right 2013   NASAL SEPTUM SURGERY     NASAL TURBINATE REDUCTION Bilateral 12/12/2016   Procedure: TURBINATE REDUCTION/SUBMUCOSAL RESECTION;  Surgeon: Linus Salmons, MD;  Location: [facility] ORS;  Service: ENT;  Laterality: Bilateral;   Family History:  Family History  Problem Relation Age of Onset   Hepatitis C Mother    Cancer Mother    Cancer Maternal Aunt    Hypertension Maternal Grandmother    Family Psychiatric  History: See H&P Social History:  Social History   Substance and Sexual Activity  Alcohol Use Yes   Comment: 1/2-5th pint of liquor     Social History   Substance and Sexual Activity  Drug Use Yes   Types: Cocaine, Marijuana   Comment: heroin    Social History   Socioeconomic History   Marital status: Divorced    Spouse name: Not on file   Number of children: Not on file   Years of education: Not on file   Highest education level: Not on file  Occupational History   Not on file  Tobacco Use   Smoking status: Every Day    Packs/day: 1.00    Years: 20.00    Pack years: 20.00    Types: Cigarettes   Smokeless tobacco: Never  Vaping Use   Vaping Use: Never used  Substance and Sexual Activity   Alcohol use: Yes    Comment: 1/2-5th pint of liquor   Drug  use: Yes    Types: Cocaine, Marijuana    Comment: heroin   Sexual activity: Yes    Birth control/protection: None  Other Topics Concern   Not on file  Social History Narrative   Not on file   Social Determinants of Health   Financial Resource Strain: Not on file  Food Insecurity: Not on file  Transportation Needs: Not on file  Physical Activity: Not on file  Stress: Not on file  Social Connections: Not on file   Additional Social History:      Sleep: Poor  Appetite:  Fair  Current Medications: Current Facility-Administered Medications  Medication Dose Route Frequency Provider Last Rate Last Admin   acetaminophen (TYLENOL) tablet 650 mg  650 mg Oral Q6H PRN Clapacs, [name], MD        alum & mag hydroxide-simeth (MAALOX/MYLANTA) 200-200-20 MG/5ML suspension 30 mL  30 mL Oral Q4H PRN Clapacs, [name], MD       ARIPiprazole (ABILIFY) tablet 10 mg  10 mg Oral Daily Clapacs, [name], MD   10 mg at 05/06/21 0903   ARIPiprazole ER (ABILIFY MAINTENA) injection 400 mg  400 mg Intramuscular Q28 days Jesse Sans, MD   400 mg at 05/05/21 1121   divalproex (DEPAKOTE ER) 24 hr tablet 1,000 mg  1,000 mg Oral QHS Clapacs, [name], MD   1,000 mg at 05/05/21 2112   hydrOXYzine (ATARAX/VISTARIL) tablet 25 mg  25 mg Oral TID PRN Clapacs, [name], MD   25 mg at 05/04/21 0915   ibuprofen (ADVIL) tablet 800 mg  800 mg Oral Q6H PRN Clapacs, Jackquline Denmark, MD       lidocaine (LMX) 4 % cream   Topical TID PRN Jesse Sans, MD       magnesium hydroxide (MILK OF MAGNESIA) suspension 30 mL  30 mL Oral Daily PRN Clapacs, [name], MD       mirtazapine (REMERON) tablet 15 mg  15 mg Oral QHS Clapacs, [name], MD   15 mg at 05/05/21 2112   traZODone (DESYREL) tablet 150 mg  150 mg Oral QHS PRN Jesse Sans, MD   150 mg at 05/05/21 2112    Lab Results:  No results found for this or any previous visit (from the past 48 hour(s)).   Blood Alcohol level:  Lab Results  Component Value Date   ETH <10 05/01/2021   ETH <10 04/01/2021    Metabolic Disorder Labs: Lab Results  Component Value Date   HGBA1C 5.5 04/02/2021   MPG 111 04/02/2021   MPG 119.76 01/23/2021   No results found for: PROLACTIN Lab Results  Component Value Date   CHOL 195 05/04/2021   TRIG 75 05/04/2021   HDL 58 05/04/2021   CHOLHDL 3.4 05/04/2021   VLDL 15 05/04/2021   LDLCALC 122 (H) 05/04/2021   LDLCALC 81 04/02/2021    Physical Findings: AIMS:  , ,  ,  ,    CIWA:    COWS:     Musculoskeletal: Strength & Muscle Tone: within normal limits Gait & Station: normal Patient leans: N/A  Psychiatric Specialty Exam:  Presentation  General Appearance: Casual  Eye Contact:Minimal  Speech:Normal Rate  Speech  Volume:Normal  Handedness:Right   Mood and Affect  Mood:Dysphoric  Affect:Congruent   Thought Process  Thought Processes:Goal Directed  Descriptions of Associations:Intact  Orientation:Full (Time, Place and Person)  Thought Content:Rumination  History of Schizophrenia/Schizoaffective disorder:No  Duration of Psychotic Symptoms:Less than six months  Hallucinations:Hallucinations: None  Ideas of Reference:None  Suicidal Thoughts:Suicidal Thoughts: Yes, Active SI Active Intent and/or Plan: With Intent; With Plan  Homicidal Thoughts:Homicidal Thoughts: No   Sensorium  Memory:Immediate Fair; Recent Fair; Remote Fair  Judgment:Impaired  Insight:Shallow   Executive Functions  Concentration:Fair  Attention Span:Fair  Recall:Fair  Fund of Knowledge:Fair  Language:Fair   Psychomotor Activity  Psychomotor Activity:Psychomotor Activity: Normal   Assets  Assets:Communication Skills; Desire for Improvement; Financial Resources/Insurance; Resilience   Sleep  Sleep:Sleep: Good Number of Hours of Sleep: 8.3    Physical Exam: Physical Exam ROS Blood pressure 109/80, pulse 86, temperature 98.2 F (36.8 C), temperature source Oral, resp. rate 17, height 5\' 10"  (1.778 m), weight 79.4 kg, SpO2 99 %. Body mass index is 25.11 kg/m.   Treatment Plan Summary: Daily contact with patient to assess and evaluate symptoms and progress in treatment and Medication management71 year old male with bipolar 1 disorder current episode depressed, severe without psychotic features and polysubstance abuse presenting for suicidal ideations.  Continue Depakote 1000 mg nightly and Abilify 10 mg daily.  Abilify Maintenna 400 mg IM LAI administered 7/14. PT evaluation completed.  They did not recommend any equipment or PT follow-up inpatient or outpatient.  Patient desires residential substance abuse treatment at discharge.Check valproic acid level tonight.   8/14,  MD 05/06/2021, 10:55 AM

## 2021-05-06 NOTE — Progress Notes (Signed)
Recreation Therapy Notes   Date: 05/06/2021  Time: 10:15 am   Location: Courtyard     Behavioral response: N/A   Intervention Topic: Wellness   Discussion/Intervention: Patient did not attend group.   Clinical Observations/Feedback:  Patient did not attend group.   Kyara Boxer LRT/CTRS        Selicia Windom 05/06/2021 11:15 AM

## 2021-05-06 NOTE — Plan of Care (Signed)
Pt denies depression, anxiety, SI, HI and AVH. Pt was educated on care plan and verbalizes understanding. Lucill Mauck RN Problem: Education: Goal: Knowledge of [facility] Education information/materials will improve Outcome: Progressing Goal: Emotional status will improve Outcome: Progressing Goal: Mental status will improve Outcome: Progressing Goal: Verbalization of understanding the information provided will improve Outcome: Progressing   Problem: Activity: Goal: Interest or engagement in activities will improve Outcome: Progressing Goal: Sleeping patterns will improve Outcome: Progressing   Problem: Coping: Goal: Ability to verbalize frustrations and anger appropriately will improve Outcome: Progressing Goal: Ability to demonstrate self-control will improve Outcome: Progressing   Problem: Health Behavior/Discharge Planning: Goal: Identification of resources available to assist in meeting health care needs will improve Outcome: Progressing Goal: Compliance with treatment plan for underlying cause of condition will improve Outcome: Progressing   Problem: Physical Regulation: Goal: Ability to maintain clinical measurements within normal limits will improve Outcome: Progressing   Problem: Safety: Goal: Periods of time without injury will increase Outcome: Progressing   Problem: Education: Goal: Utilization of techniques to improve thought processes will improve Outcome: Progressing Goal: Knowledge of the prescribed therapeutic regimen will improve Outcome: Progressing   Problem: Activity: Goal: Interest or engagement in leisure activities will improve Outcome: Progressing Goal: Imbalance in normal sleep/wake cycle will improve Outcome: Progressing   Problem: Coping: Goal: Coping ability will improve Outcome: Progressing Goal: Will verbalize feelings Outcome: Progressing   Problem: Health Behavior/Discharge Planning: Goal: Ability to make decisions  will improve Outcome: Progressing Goal: Compliance with therapeutic regimen will improve Outcome: Progressing   Problem: Role Relationship: Goal: Will demonstrate positive changes in social behaviors and relationships Outcome: Progressing   Problem: Safety: Goal: Ability to disclose and discuss suicidal ideas will improve Outcome: Progressing Goal: Ability to identify and utilize support systems that promote safety will improve Outcome: Progressing   Problem: Self-Concept: Goal: Will verbalize positive feelings about self Outcome: Progressing Goal: Level of anxiety will decrease Outcome: Progressing   

## 2021-05-06 NOTE — BHH Counselor (Signed)
CSW spoke with [facility].  CSW was able to identify that they help "with employment and people get on their feet".  He indicated that the program is popular due to being one of few programs that are designed to assist sex offenders.    Staff reports that patient will need to call them as well as complete an application.  Penni Homans, MSW, LCSW 05/06/2021 2:08 PM

## 2021-05-06 NOTE — BHH Group Notes (Signed)
LCSW Group Therapy Note     05/06/2021 2:16 PM     Type of Therapy and Topic:  Group Therapy:  Feelings around Relapse and Recovery     Participation Level:  None     Description of Group:    Patients in this group will discuss emotions they experience before and after a relapse. They will process how experiencing these feelings, or avoidance of experiencing them, relates to having a relapse. Facilitator will guide patients to explore emotions they have related to recovery. Patients will be encouraged to process which emotions are more powerful. They will be guided to discuss the emotional reaction significant others in their lives may have to their relapse or recovery. Patients will be assisted in exploring ways to respond to the emotions of others without this contributing to a relapse.     Therapeutic Goals:  1.    Patient will identify two or more emotions that lead to a relapse for them  2.    Patient will identify two emotions that result when they relapse  3.    Patient will identify two emotions related to recovery  4.    Patient will demonstrate ability to communicate their needs through discussion and/or role plays        Summary of Patient Progress:   Patient was not present for the entirety of group. He did not engage with group but kept his head on the table and looked away from the other participants. He then abruptly stood up and walked out of the room before the session was finished. Patient did not return to session.   Chynah Orihuela [place], MSW, LCSW-A 7/15/20222:18 PM            Therapeutic Modalities:   Cognitive Behavioral Therapy  Solution-Focused Therapy  Assertiveness Training  Relapse Prevention Therapy        Rayann Jolley [place], MSW, LCSW-A  05/06/2021 2:16 PM

## 2021-05-07 NOTE — Progress Notes (Signed)
Patient was medication compliant.  He denies SI/HI. He has been in his room throughout the day.  He did not attend group.

## 2021-05-07 NOTE — Plan of Care (Signed)
D: Patient denies SI/HI/AVH. Patient denies pain. Patient is minimal on assessment.  A: Patient was assessed by this nurse. Patient encouraged to report any thoughts of SI/HI/AVH to staff.  Patient received scheduled medications. Q x 15 minute observation checks were completed for safety. Patient was provided with verbal education on provided medications. Patient care plan was reviewed. Patient was offered support and encouragement. Patient was encourage to attend groups, participate in unit activities and continue with plan of care.    R: Patient adheres with scheduled medication. Patient responded well to PRN medications and symptoms were relieved. Patient has "no complaints of pain at this time". Patient is receptive to treatment and safety maintained on unit.     Problem: Education: Goal: Knowledge of  General Education information/materials will improve Outcome: Not Progressing Goal: Emotional status will improve Outcome: Not Progressing Goal: Mental status will improve Outcome: Not Progressing

## 2021-05-07 NOTE — Progress Notes (Signed)
[facility] MD Progress Note  05/07/2021 2:12 PM Cory Floyd  MRN:  [id number]  CC "can I be discharged today? "  Subjective:  45 year old male with history of bipolar disorder and polysubstance abuse (UDS is + for amphetamine on admission)  presenting to emergency room for suicidal ideations.    Staff reported No acute events overnight,  Cory Floyd has been medication compliant, attending to ADLs.    Today, Cory Floyd was sleeping later in the morning, did not want to engage initially.  Cory Floyd said that Cory Floyd was just tired. Cory Floyd denied craving for Meth, denied SI/HI/AVH.  Later, Cory Floyd asked the writer to discharge him, but gave no reason.   Cory Floyd tolerates meds, including Abilify Maintena 2 days ago. Cory Floyd said that Cory Floyd wants to continue to work with his ACT.   Principal Problem: Bipolar disorder with severe depression (HCC) Diagnosis: Principal Problem:   Bipolar disorder with severe depression (HCC) Active Problems:   Arthritis   Amphetamine abuse (HCC)  Total Time spent with patient: 30 minutes  Past Psychiatric History: See H&P  Past Medical History:  Past Medical History:  Diagnosis Date   Alcoholic (HCC)    clean for 3 years   Anginal pain (HCC)    Arthritis    Bipolar 1 disorder (HCC)    COVID-19    Depressed    Diabetes mellitus without complication (HCC)    Dyspnea    Fatty liver    Hypertension    Schizophrenia (HCC)     Past Surgical History:  Procedure Laterality Date   COLONOSCOPY  02/05/2014   diverticulosis, hyperplastic polyp x 2   fracture arm Right    KNEE SURGERY Right 2013   NASAL SEPTUM SURGERY     NASAL TURBINATE REDUCTION Bilateral 12/12/2016   Procedure: TURBINATE REDUCTION/SUBMUCOSAL RESECTION;  Surgeon: Linus Salmons, MD;  Location: [facility] ORS;  Service: ENT;  Laterality: Bilateral;   Family History:  Family History  Problem Relation Age of Onset   Hepatitis C Mother    Cancer Mother    Cancer Maternal Aunt    Hypertension Maternal Grandmother    Family Psychiatric   History: See H&P Social History:  Social History   Substance and Sexual Activity  Alcohol Use Yes   Comment: 1/2-5th pint of liquor     Social History   Substance and Sexual Activity  Drug Use Yes   Types: Cocaine, Marijuana   Comment: heroin    Social History   Socioeconomic History   Marital status: Divorced    Spouse name: Not on file   Number of children: Not on file   Years of education: Not on file   Highest education level: Not on file  Occupational History   Not on file  Tobacco Use   Smoking status: Every Day    Packs/day: 1.00    Years: 20.00    Pack years: 20.00    Types: Cigarettes   Smokeless tobacco: Never  Vaping Use   Vaping Use: Never used  Substance and Sexual Activity   Alcohol use: Yes    Comment: 1/2-5th pint of liquor   Drug use: Yes    Types: Cocaine, Marijuana    Comment: heroin   Sexual activity: Yes    Birth control/protection: None  Other Topics Concern   Not on file  Social History Narrative   Not on file   Social Determinants of Health   Financial Resource Strain: Not on file  Food Insecurity: Not on file  Transportation Needs:  Not on file  Physical Activity: Not on file  Stress: Not on file  Social Connections: Not on file   Additional Social History:      Sleep: Poor  Appetite:  Fair  Current Medications: Current Facility-Administered Medications  Medication Dose Route Frequency Provider Last Rate Last Admin   acetaminophen (TYLENOL) tablet 650 mg  650 mg Oral Q6H PRN Clapacs, [name], MD       alum & mag hydroxide-simeth (MAALOX/MYLANTA) 200-200-20 MG/5ML suspension 30 mL  30 mL Oral Q4H PRN Clapacs, [name], MD       ARIPiprazole (ABILIFY) tablet 10 mg  10 mg Oral Daily Clapacs, Jackquline Denmark, MD   10 mg at 05/07/21 0753   ARIPiprazole ER (ABILIFY MAINTENA) injection 400 mg  400 mg Intramuscular Q28 days Jesse Sans, MD   400 mg at 05/05/21 1121   divalproex (DEPAKOTE ER) 24 hr tablet 1,000 mg  1,000 mg Oral QHS  Clapacs, [name], MD   1,000 mg at 05/06/21 2115   hydrOXYzine (ATARAX/VISTARIL) tablet 25 mg  25 mg Oral TID PRN Clapacs, Jackquline Denmark, MD   25 mg at 05/04/21 0915   ibuprofen (ADVIL) tablet 800 mg  800 mg Oral Q6H PRN Clapacs, Jackquline Denmark, MD       lidocaine (LMX) 4 % cream   Topical TID PRN Jesse Sans, MD       magnesium hydroxide (MILK OF MAGNESIA) suspension 30 mL  30 mL Oral Daily PRN Clapacs, [name], MD       mirtazapine (REMERON) tablet 15 mg  15 mg Oral QHS Clapacs, [name], MD   15 mg at 05/06/21 2115   traZODone (DESYREL) tablet 150 mg  150 mg Oral QHS PRN Jesse Sans, MD   150 mg at 05/06/21 2115    Lab Results:  Results for orders placed or performed during the hospital encounter of 05/02/21 (from the past 48 hour(s))  Valproic acid level     Status: None   Collection Time: 05/06/21  6:18 PM  Result Value Ref Range   Valproic Acid Lvl 51 50.0 - 100.0 ug/mL    Comment: Performed at [facility], [address] [place], [place] [address]     Blood Alcohol level:  Lab Results  Component Value Date   [facility] <10 05/01/2021   ETH <10 04/01/2021    Metabolic Disorder Labs: Lab Results  Component Value Date   HGBA1C 5.5 04/02/2021   MPG 111 04/02/2021   MPG 119.76 01/23/2021   No results found for: PROLACTIN Lab Results  Component Value Date   CHOL 195 05/04/2021   TRIG 75 05/04/2021   HDL 58 05/04/2021   CHOLHDL 3.4 05/04/2021   VLDL 15 05/04/2021   LDLCALC 122 (H) 05/04/2021   LDLCALC 81 04/02/2021    Physical Findings: AIMS:  , ,  ,  ,    CIWA:    COWS:     Musculoskeletal: Strength & Muscle Tone: within normal limits Gait & Station: normal Patient leans: N/A  Psychiatric Specialty Exam:  Presentation  General Appearance: Casual  Eye Contact:Minimal  Speech:Normal Rate  Speech Volume:Normal  Handedness:Right   Mood and Affect  Mood:Dysphoric  Affect:Congruent   Thought Process  Thought Processes:Goal Directed  Descriptions of  Associations:Intact  Orientation:Full (Time, Place and Person)  Thought Content:Rumination  History of Schizophrenia/Schizoaffective disorder:No  Duration of Psychotic Symptoms:Less than six months  Hallucinations:No data recorded  Ideas of Reference:None  Suicidal Thoughts:No data  recorded denied SI  Homicidal Thoughts:No data recorded   Sensorium  Memory:Immediate Fair; Recent Fair; Remote Fair  Judgment:Impaired  Insight:Shallow   Executive Functions  Concentration:Fair  Attention Span:Fair  Recall:Fair  Fund of Knowledge:Fair  Language:Fair   Psychomotor Activity  Psychomotor Activity:No data recorded   Assets  Assets:Communication Skills; Desire for Improvement; Financial Resources/Insurance; Resilience  Sleep  Sleep:No data recorded  Physical Exam: Physical Exam Constitutional:      Appearance: Normal appearance. Delila Kuklinski is normal weight.  Neurological:     Mental Status: Jerid Catherman is alert.  Psychiatric:        Attention and Perception: Zyona Pettaway is inattentive.        Mood and Affect: Mood is anxious and depressed.        Speech: Speech normal.        Behavior: Behavior is cooperative.        Thought Content: Thought content normal.        Cognition and Memory: Cognition normal.   ROS Blood pressure 111/77, pulse 77, temperature (!) 97.4 F (36.3 C), temperature source Oral, resp. rate 17, height 5\' 10"  (1.778 m), weight 79.4 kg, SpO2 100 %. Body mass index is 25.11 kg/m.   Treatment Plan Summary: Daily contact with patient to assess and evaluate symptoms and progress in treatment and Medication management40 year old male with bipolar 1 disorder current episode depressed, severe without psychotic features and polysubstance abuse presenting for suicidal ideations.    Bipolar I - Continue Depakote 1000 mg nightly, VPA is 51 on 05/06/21 - Continue Abilify 10 mg daily.  Abilify Maintenna 400 mg IM LAI administered 7/14.   Polysubstrance use disorder (+  Methamphetamine on adimission)  - Patient desires residential substance abuse treatment at discharge. CSW team looking into whether Avid Guillette can get into any substance abuse treatment programs with a sex offender status here in 8/14. - Ioma Chismar is interested in a program called [facility] in [place] that will accept him despite being on the sex offender registry.  Deavin Forst provides the number.  However, Arif Amendola does not have a way to get there.     Reily Treloar, MD 05/07/2021, 2:12 PM

## 2021-05-07 NOTE — [facility] Group Notes (Signed)
[facility] LCSW Group Therapy Note  Date/Time:  05/07/2021 1:28-2:09 PM   Type of Therapy and Topic:  Group Therapy:  Healthy and Unhealthy Supports  Participation Level:  None   Description of Group:  Patients in this group were introduced to the idea of adding a variety of healthy supports to address the various needs in their lives.Patients discussed what additional healthy supports could be helpful in their recovery and wellness after discharge in order to prevent future hospitalizations.   An emphasis was placed on using counselor, doctor, therapy groups, 12-step groups, and problem-specific support groups to expand supports.  They also worked as a group on developing a specific plan for several patients to deal with unhealthy supports through boundary-setting, psychoeducation with loved ones, and even termination of relationships.   Therapeutic Goals:   1)  discuss importance of adding supports to stay well once out of the hospital  2)  compare healthy versus unhealthy supports and identify some examples of each  3)  generate ideas and descriptions of healthy supports that can be added  4)  offer mutual support about how to address unhealthy supports  5)  encourage active participation in and adherence to discharge plan    Summary of Patient Progress: Patient came at the end of group and did not participate.     Therapeutic Modalities:   Motivational Interviewing Brief Solution-Focused Therapy  Sharman Cheek 05/07/2021  2:37 PM

## 2021-05-08 NOTE — Plan of Care (Signed)
  Problem: Education: Goal: Knowledge of [facility] Education information/materials will improve Outcome: Progressing Goal: Emotional status will improve Outcome: Progressing Goal: Mental status will improve Outcome: Progressing Goal: Verbalization of understanding the information provided will improve Outcome: Progressing   Problem: Activity: Goal: Interest or engagement in activities will improve Outcome: Progressing Goal: Sleeping patterns will improve Outcome: Progressing   Problem: Coping: Goal: Ability to verbalize frustrations and anger appropriately will improve Outcome: Progressing Goal: Ability to demonstrate self-control will improve Outcome: Progressing   Problem: Health Behavior/Discharge Planning: Goal: Identification of resources available to assist in meeting health care needs will improve Outcome: Progressing Goal: Compliance with treatment plan for underlying cause of condition will improve Outcome: Progressing   Problem: Physical Regulation: Goal: Ability to maintain clinical measurements within normal limits will improve Outcome: Progressing   Problem: Safety: Goal: Periods of time without injury will increase Outcome: Progressing   Problem: Education: Goal: Utilization of techniques to improve thought processes will improve Outcome: Progressing Goal: Knowledge of the prescribed therapeutic regimen will improve Outcome: Progressing   Problem: Activity: Goal: Interest or engagement in leisure activities will improve Outcome: Progressing Goal: Imbalance in normal sleep/wake cycle will improve Outcome: Progressing   Problem: Coping: Goal: Coping ability will improve Outcome: Progressing Goal: Will verbalize feelings Outcome: Progressing   Problem: Health Behavior/Discharge Planning: Goal: Ability to make decisions will improve Outcome: Progressing Goal: Compliance with therapeutic regimen will improve Outcome: Progressing    Problem: Role Relationship: Goal: Will demonstrate positive changes in social behaviors and relationships Outcome: Progressing   Problem: Safety: Goal: Ability to disclose and discuss suicidal ideas will improve Outcome: Progressing Goal: Ability to identify and utilize support systems that promote safety will improve Outcome: Progressing   Problem: Self-Concept: Goal: Will verbalize positive feelings about self Outcome: Progressing Goal: Level of anxiety will decrease Outcome: Progressing   

## 2021-05-08 NOTE — [facility] Group Notes (Deleted)
[facility] Group Notes:  (Nursing/MHT/Case Management/Adjunct)  Date:  05/08/2021  Time:  9:18 PM  Type of Therapy:  Group Therapy  Participation Level:  Active  Participation Quality:  Appropriate  Affect:  Appropriate  Cognitive:  Alert  Insight:  Good  Engagement in Group:  Engaged and he said week his plan is to go to [facility].  Modes of Intervention:  Support  Summary of Progress/Problems:  Mayra Neer 05/08/2021, 9:18 PM

## 2021-05-08 NOTE — Progress Notes (Signed)
[facility] MD Progress Note  05/08/2021 11:46 AM Cory Floyd  MRN:  [id number]  CC "fine "  Subjective:  45 year old male with history of bipolar disorder and polysubstance abuse (UDS is + for amphetamine on admission)  presenting to emergency room for suicidal ideations.    Staff reported No acute events overnight,  Cory Floyd has been medication compliant, attending to ADLs.    Today, Cory Floyd asked to be discharged yesterday because Cory Floyd said that Cory Floyd has a friend who offered to stay yesterday, but stated that Cory Floyd cant' stay here anymore.  Thus, Cory Floyd doesn't want to be discharged anymore.  Otherwise, Cory Floyd is doing well and encouraged him to attend our Group.    Cory Floyd tolerates meds, including Abilify Maintena 2 days ago. Cory Floyd said that Cory Floyd wants to continue to work with his ACT.   Principal Problem: Bipolar disorder with severe depression (HCC) Diagnosis: Principal Problem:   Bipolar disorder with severe depression (HCC) Active Problems:   Arthritis   Amphetamine abuse (HCC)  Total Time spent with patient: 30 minutes  Past Psychiatric History: See H&P  Past Medical History:  Past Medical History:  Diagnosis Date   Alcoholic (HCC)    clean for 3 years   Anginal pain (HCC)    Arthritis    Bipolar 1 disorder (HCC)    COVID-19    Depressed    Diabetes mellitus without complication (HCC)    Dyspnea    Fatty liver    Hypertension    Schizophrenia (HCC)     Past Surgical History:  Procedure Laterality Date   COLONOSCOPY  02/05/2014   diverticulosis, hyperplastic polyp x 2   fracture arm Right    KNEE SURGERY Right 2013   NASAL SEPTUM SURGERY     NASAL TURBINATE REDUCTION Bilateral 12/12/2016   Procedure: TURBINATE REDUCTION/SUBMUCOSAL RESECTION;  Surgeon: Linus Salmons, MD;  Location: [facility] ORS;  Service: ENT;  Laterality: Bilateral;   Family History:  Family History  Problem Relation Age of Onset   Hepatitis C Mother    Cancer Mother    Cancer Maternal Aunt    Hypertension Maternal Grandmother     Family Psychiatric  History: See H&P Social History:  Social History   Substance and Sexual Activity  Alcohol Use Yes   Comment: 1/2-5th pint of liquor     Social History   Substance and Sexual Activity  Drug Use Yes   Types: Cocaine, Marijuana   Comment: heroin    Social History   Socioeconomic History   Marital status: Divorced    Spouse name: Not on file   Number of children: Not on file   Years of education: Not on file   Highest education level: Not on file  Occupational History   Not on file  Tobacco Use   Smoking status: Every Day    Packs/day: 1.00    Years: 20.00    Pack years: 20.00    Types: Cigarettes   Smokeless tobacco: Never  Vaping Use   Vaping Use: Never used  Substance and Sexual Activity   Alcohol use: Yes    Comment: 1/2-5th pint of liquor   Drug use: Yes    Types: Cocaine, Marijuana    Comment: heroin   Sexual activity: Yes    Birth control/protection: None  Other Topics Concern   Not on file  Social History Narrative   Not on file   Social Determinants of Health   Financial Resource Strain: Not on file  Food Insecurity: Not  on file  Transportation Needs: Not on file  Physical Activity: Not on file  Stress: Not on file  Social Connections: Not on file   Additional Social History:      Sleep: Poor  Appetite:  Fair  Current Medications: Current Facility-Administered Medications  Medication Dose Route Frequency Provider Last Rate Last Admin   acetaminophen (TYLENOL) tablet 650 mg  650 mg Oral Q6H PRN Clapacs, [name], MD       alum & mag hydroxide-simeth (MAALOX/MYLANTA) 200-200-20 MG/5ML suspension 30 mL  30 mL Oral Q4H PRN Clapacs, [name], MD       ARIPiprazole (ABILIFY) tablet 10 mg  10 mg Oral Daily Clapacs, Jackquline Denmark, MD   10 mg at 05/08/21 0812   ARIPiprazole ER (ABILIFY MAINTENA) injection 400 mg  400 mg Intramuscular Q28 days Jesse Sans, MD   400 mg at 05/05/21 1121   divalproex (DEPAKOTE ER) 24 hr tablet 1,000 mg   1,000 mg Oral QHS Clapacs, [name], MD   1,000 mg at 05/07/21 2122   hydrOXYzine (ATARAX/VISTARIL) tablet 25 mg  25 mg Oral TID PRN Clapacs, [name], MD   25 mg at 05/04/21 0915   ibuprofen (ADVIL) tablet 800 mg  800 mg Oral Q6H PRN Clapacs, Jackquline Denmark, MD       lidocaine (LMX) 4 % cream   Topical TID PRN Jesse Sans, MD       magnesium hydroxide (MILK OF MAGNESIA) suspension 30 mL  30 mL Oral Daily PRN Clapacs, [name], MD       mirtazapine (REMERON) tablet 15 mg  15 mg Oral QHS Clapacs, [name], MD   15 mg at 05/07/21 2122   traZODone (DESYREL) tablet 150 mg  150 mg Oral QHS PRN Jesse Sans, MD   150 mg at 05/06/21 2115    Lab Results:  Results for orders placed or performed during the hospital encounter of 05/02/21 (from the past 48 hour(s))  Valproic acid level     Status: None   Collection Time: 05/06/21  6:18 PM  Result Value Ref Range   Valproic Acid Lvl 51 50.0 - 100.0 ug/mL    Comment: Performed at [facility], [address] [place], [place] [address]     Blood Alcohol level:  Lab Results  Component Value Date   [facility] <10 05/01/2021   ETH <10 04/01/2021    Metabolic Disorder Labs: Lab Results  Component Value Date   HGBA1C 5.5 04/02/2021   MPG 111 04/02/2021   MPG 119.76 01/23/2021   No results found for: PROLACTIN Lab Results  Component Value Date   CHOL 195 05/04/2021   TRIG 75 05/04/2021   HDL 58 05/04/2021   CHOLHDL 3.4 05/04/2021   VLDL 15 05/04/2021   LDLCALC 122 (H) 05/04/2021   LDLCALC 81 04/02/2021    Physical Findings: AIMS:  , ,  ,  ,    CIWA:    COWS:     Musculoskeletal: Strength & Muscle Tone: within normal limits Gait & Station: normal Patient leans: N/A  Psychiatric Specialty Exam:  Presentation  General Appearance: Casual  Eye Contact:Minimal  Speech:Normal Rate  Speech Volume:Normal  Handedness:Right   Mood and Affect  Mood:Dysphoric  Affect:Congruent   Thought Process  Thought Processes:Goal  Directed  Descriptions of Associations:Intact  Orientation:Full (Time, Place and Person)  Thought Content:Rumination  History of Schizophrenia/Schizoaffective disorder:No  Duration of Psychotic Symptoms:Less than six months  Hallucinations:No data recorded  Ideas of  Reference:None  Suicidal Thoughts:No data recorded denied SI  Homicidal Thoughts:No data recorded   Sensorium  Memory:Immediate Fair; Recent Fair; Remote Fair  Judgment:Impaired  Insight:Shallow   Executive Functions  Concentration:Fair  Attention Span:Fair  Recall:Fair  Fund of Knowledge:Fair  Language:Fair   Psychomotor Activity  Psychomotor Activity:No data recorded   Assets  Assets:Communication Skills; Desire for Improvement; Financial Resources/Insurance; Resilience  Sleep  Sleep:No data recorded  Physical Exam: Physical Exam Constitutional:      Appearance: Normal appearance. Sharaine Delange is normal weight.  Neurological:     Mental Status: Benjerman Molinelli is alert.  Psychiatric:        Attention and Perception: Ashle Stief is inattentive.        Mood and Affect: Mood is anxious and depressed.        Speech: Speech normal.        Behavior: Behavior is cooperative.        Thought Content: Thought content normal.        Cognition and Memory: Cognition normal.   ROS Blood pressure 102/81, pulse 80, temperature 98.1 F (36.7 C), temperature source Oral, resp. rate 17, height 5\' 10"  (1.778 m), weight 79.4 kg, SpO2 99 %. Body mass index is 25.11 kg/m.   Treatment Plan Summary: Daily contact with patient to assess and evaluate symptoms and progress in treatment and Medication management  45 year old male with bipolar 1 disorder current episode depressed, severe without psychotic features and polysubstance abuse presenting for suicidal ideations.    Bipolar I - Continue Depakote 1000 mg nightly, VPA is 51 on 05/06/21 - Continue Abilify 10 mg daily.  Abilify Maintenna 400 mg IM LAI administered 7/14.    Polysubstrance use disorder (+ Methamphetamine on adimission)  - Patient desires residential substance abuse treatment at discharge. CSW team looking into whether Raylin Winer can get into any substance abuse treatment programs with a sex offender status here in 8/14. - Noni Stonesifer is interested in a program called [facility] in [place] that will accept him despite being on the sex offender registry.  Jarren Para provides the number.  However, Norvell Ureste does not have a way to get there.     Caron Ode, MD 05/08/2021, 11:46 AM

## 2021-05-08 NOTE — Plan of Care (Signed)
  Problem: Activity: Goal: Interest or engagement in activities will improve Outcome: Progressing Note: Pt attended group therapy today.   Problem: Health Behavior/Discharge Planning: Goal: Compliance with therapeutic regimen will improve Outcome: Progressing Note: Pt is medication compliant.   Problem: Safety: Goal: Ability to disclose and discuss suicidal ideas will improve Outcome: Progressing Note: Pt denies SI/HI.

## 2021-05-08 NOTE — Progress Notes (Signed)
Patient ID: Cory Floyd, male   DOB: 1976-06-29, 45 y.o.   MRN: [id number]   Pt is calm and cooperative during assessment. Pt denied SI/HI, AVH, and any pain. Pt was isolative to his room for most of the morning but was ambulatory with no issues in the hallway and dayroom during early afternoon and throughout the day. Pt was compliant with his medication and attended group therapy today. Education, support and encouragement provided, q15 minute safety checks remain in effect. Medications administered per MD orders and no reactions/side effects to medicine noted. Pt denies any concerns at this time, and verbally contracts for safety. Pt remains safe on the unit.

## 2021-05-08 NOTE — [facility] Group Notes (Signed)
LCSW Group Therapy Note     05/08/2021 3:09 PM     Type of Therapy and Topic:  Group Therapy: Coping Skills     Participation Level:  Minimal       Description of Group: In this group, patients will learn about coping strategies. Patients will define what a coping strategy is and discuss how utilizing coping strategies can aid in emotional regulation and assist in the management of stress, anxiety, and depression symptoms. Patients will identify coping strategies that have helped them manage challenging emotions in the past and explore new coping strategies that they can begin to implement during times of emotional distress. Patients will learn the difference between positive and negative coping strategies and be encouraged to identify healthy replacements for unhelpful behaviors. Patients will be facilitated through the practice of a grounding technique to add to their coping strategy toolbox.         Therapeutic Goals:  1. Patient will be able to define what a coping strategy is and understand the importance of using coping skills for emotional well-being.  2. Patient will identify three coping strategies that they can practice when experiencing feelings of stress, anxiety, anger, and/or depression.  3. Patient will identify one unhelpful coping strategy they have used prior to admission and identify a healthy replacement they can practice instead.  4. Patient will learn one new grounding technique they can practice after discharge to aid in emotional regulation.      Summary of Patient Progress: Patient presented to group, however left shortly after the icebreaker activity.        Therapeutic Modalities:  Cognitive Behavioral Therapy Relapse Prevention Therapy Solution-Focused Therapy    Norberto Sorenson, LCSWA 05/08/2021 3:09 PM

## 2021-05-08 NOTE — [facility] Group Notes (Signed)
[facility] Group Notes:  (Nursing/MHT/Case Management/Adjunct)  Date:  05/08/2021  Time:  9:38 PM  Type of Therapy:  Group Therapy  Participation Level:  Active  Participation Quality:  Appropriate  Affect:  Appropriate  Cognitive:  Alert  Insight:  Good  Engagement in Group:  Engaged  Modes of Intervention:  Activity  Summary of Progress/Problems:  Cory Floyd 05/08/2021, 9:38 PM

## 2021-05-08 NOTE — Progress Notes (Signed)
Patient has been pleasant and cooperative. Denies SI HI and AVH. Affect is brighter. Smiling more. Contracts for safety

## 2021-05-09 DIAGNOSIS — F314 Bipolar disorder, current episode depressed, severe, without psychotic features: Secondary | ICD-10-CM | POA: Diagnosis not present

## 2021-05-09 MED ORDER — HYDROXYZINE HCL 25 MG PO TABS: 25.0000 mg | ORAL_TABLET | Freq: Three times a day (TID) | ORAL | 0 refills | Status: DC | PRN

## 2021-05-09 MED ORDER — TRAZODONE HCL 100 MG PO TABS: 100.0000 mg | ORAL_TABLET | Freq: Every evening | ORAL | 1 refills | Status: DC | PRN

## 2021-05-09 MED ORDER — DIVALPROEX SODIUM ER 500 MG PO TB24: 1000.0000 mg | ORAL_TABLET | Freq: Every day | ORAL | 1 refills | Status: DC

## 2021-05-09 MED ORDER — ARIPIPRAZOLE 10 MG PO TABS: 10.0000 mg | ORAL_TABLET | Freq: Every day | ORAL | 1 refills | Status: DC

## 2021-05-09 MED ORDER — MIRTAZAPINE 15 MG PO TABS: 15.0000 mg | ORAL_TABLET | Freq: Every day | ORAL | 1 refills | Status: DC

## 2021-05-09 MED ORDER — ARIPIPRAZOLE ER 400 MG IM SRER: 400.0000 mg | INTRAMUSCULAR | 3 refills | Status: DC

## 2021-05-09 NOTE — Progress Notes (Signed)
Patient has been pleasant and cooperative. Complaining of being hungry and asking for double portions. Given extra snack. Denies SI, HI and AVH. Given trazodone per prn order for complaints of insomnia. No further complaints

## 2021-05-09 NOTE — Progress Notes (Signed)
  [facility] Adult Case Management Discharge Plan :  Will you be returning to the same living situation after discharge:  Yes,  pt reports that he is staying with a friend.  At discharge, do you have transportation home?: Yes,  CSW will assist with transportation needs. Do you have the ability to pay for your medications: Yes,  [facility] Medicare  Release of information consent forms completed and in the chart;  Patient's signature needed at discharge.  Patient to Follow up at:  Follow-up Information     [facility] Follow up.   Why: Your ACTT team is aware of your discharge and will follow up with you.  Your appointment with your psychiatrist is 05/16/2021 at 1:00PM.  Thanks! Contact information: [address] [place] [address] [phone]                 Next level of care provider has access to [facility] Link:no  Safety Planning and Suicide Prevention discussed: Yes,  SPE completed with the patient.      Has patient been referred to the Quitline?: Patient refused referral  Patient has been referred for addiction treatment: Yes  Harden Mo, LCSW 05/09/2021, 1:38 PM

## 2021-05-09 NOTE — Progress Notes (Signed)
Recreation Therapy Notes  INPATIENT RECREATION TR PLAN  Patient Details Name: Cory Floyd MRN: [id number] DOB: 1975/12/18 Today's Date: 05/09/2021  Rec Therapy Plan Is patient appropriate for Therapeutic Recreation?: Yes Treatment times per week: at least 3 Estimated Length of Stay: 5-7 days TR Treatment/Interventions: Group participation (Comment)  Discharge Criteria Pt will be discharged from therapy if:: Discharged Treatment plan/goals/alternatives discussed and agreed upon by:: Patient/family  Discharge Summary Short term goals set: Patient will engage in groups without prompting or encouragement from LRT x3 group sessions within 5 recreation therapy group sessions Short term goals met: Not met Reason goals not met: Patient did not attend any groups Therapeutic equipment acquired: N/A Reason patient discharged from therapy: Discharge from hospital Pt/family agrees with progress & goals achieved: Yes Date patient discharged from therapy: 05/09/21   Janellie Tennison 05/09/2021, 4:05 PM

## 2021-05-09 NOTE — Plan of Care (Signed)
  Problem: Education: Goal: Knowledge of [facility] Education information/materials will improve Outcome: Progressing Goal: Emotional status will improve Outcome: Progressing Goal: Mental status will improve Outcome: Progressing Goal: Verbalization of understanding the information provided will improve Outcome: Progressing   Problem: Activity: Goal: Interest or engagement in activities will improve Outcome: Progressing Goal: Sleeping patterns will improve Outcome: Progressing   Problem: Coping: Goal: Ability to verbalize frustrations and anger appropriately will improve Outcome: Progressing Goal: Ability to demonstrate self-control will improve Outcome: Progressing   Problem: Health Behavior/Discharge Planning: Goal: Identification of resources available to assist in meeting health care needs will improve Outcome: Progressing Goal: Compliance with treatment plan for underlying cause of condition will improve Outcome: Progressing   Problem: Physical Regulation: Goal: Ability to maintain clinical measurements within normal limits will improve Outcome: Progressing   Problem: Safety: Goal: Periods of time without injury will increase Outcome: Progressing   Problem: Education: Goal: Utilization of techniques to improve thought processes will improve Outcome: Progressing Goal: Knowledge of the prescribed therapeutic regimen will improve Outcome: Progressing   Problem: Activity: Goal: Interest or engagement in leisure activities will improve Outcome: Progressing Goal: Imbalance in normal sleep/wake cycle will improve Outcome: Progressing   Problem: Coping: Goal: Coping ability will improve Outcome: Progressing Goal: Will verbalize feelings Outcome: Progressing   Problem: Health Behavior/Discharge Planning: Goal: Ability to make decisions will improve Outcome: Progressing Goal: Compliance with therapeutic regimen will improve Outcome: Progressing    Problem: Role Relationship: Goal: Will demonstrate positive changes in social behaviors and relationships Outcome: Progressing   Problem: Safety: Goal: Ability to disclose and discuss suicidal ideas will improve Outcome: Progressing Goal: Ability to identify and utilize support systems that promote safety will improve Outcome: Progressing   Problem: Self-Concept: Goal: Will verbalize positive feelings about self Outcome: Progressing Goal: Level of anxiety will decrease Outcome: Progressing   

## 2021-05-09 NOTE — Tx Team (Signed)
Interdisciplinary Treatment and Diagnostic Plan Update  05/09/2021 Time of Session: 8:30AM ILDEFONSO KEANEY MRN: [id number]  Principal Diagnosis: Bipolar disorder with severe depression [facility])  Secondary Diagnoses: Principal Problem:   Bipolar disorder with severe depression (HCC) Active Problems:   Arthritis   Amphetamine abuse (HCC)   Current Medications:  Current Facility-Administered Medications  Medication Dose Route Frequency Provider Last Rate Last Admin   acetaminophen (TYLENOL) tablet 650 mg  650 mg Oral Q6H PRN Clapacs, [name], MD       alum & mag hydroxide-simeth (MAALOX/MYLANTA) 200-200-20 MG/5ML suspension 30 mL  30 mL Oral Q4H PRN Clapacs, [name], MD       ARIPiprazole (ABILIFY) tablet 10 mg  10 mg Oral Daily Clapacs, [name], MD   10 mg at 05/08/21 0812   ARIPiprazole ER (ABILIFY MAINTENA) injection 400 mg  400 mg Intramuscular Q28 days Jesse Sans, MD   400 mg at 05/05/21 1121   divalproex (DEPAKOTE ER) 24 hr tablet 1,000 mg  1,000 mg Oral QHS Clapacs, [name], MD   1,000 mg at 05/08/21 2058   hydrOXYzine (ATARAX/VISTARIL) tablet 25 mg  25 mg Oral TID PRN Clapacs, [name], MD   25 mg at 05/04/21 0915   ibuprofen (ADVIL) tablet 800 mg  800 mg Oral Q6H PRN Clapacs, Jackquline Denmark, MD       lidocaine (LMX) 4 % cream   Topical TID PRN Jesse Sans, MD       magnesium hydroxide (MILK OF MAGNESIA) suspension 30 mL  30 mL Oral Daily PRN Clapacs, [name], MD       mirtazapine (REMERON) tablet 15 mg  15 mg Oral QHS Clapacs, [name], MD   15 mg at 05/08/21 2058   traZODone (DESYREL) tablet 150 mg  150 mg Oral QHS PRN Jesse Sans, MD   150 mg at 05/08/21 2058   PTA Medications: Medications Prior to Admission  Medication Sig Dispense Refill Last Dose   ARIPiprazole (ABILIFY) 10 MG tablet Take 1 tablet (10 mg total) by mouth daily. For mood control 30 tablet 0    divalproex (DEPAKOTE ER) 500 MG 24 hr tablet Take 2 tablets (1,000 mg total) by mouth at bedtime. For mood stabilization  60 tablet 0    hydrOXYzine (ATARAX/VISTARIL) 25 MG tablet Take 1 tablet (25 mg total) by mouth 3 (three) times daily as needed for anxiety. 75 tablet 0    mirtazapine (REMERON) 15 MG tablet Take 1 tablet (15 mg total) by mouth at bedtime. For depression/sleep 30 tablet 0    traZODone (DESYREL) 100 MG tablet Take 1 tablet (100 mg total) by mouth at bedtime as needed for sleep. 30 tablet 0     Patient Stressors: Substance abuse  Patient Strengths: Ability for insight Average or above average intelligence Capable of independent living Communication skills  Treatment Modalities: Medication Management, Group therapy, Case management,  1 to 1 session with clinician, Psychoeducation, Recreational therapy.   Physician Treatment Plan for Primary Diagnosis: Bipolar disorder with severe depression (HCC) Long Term Goal(s): Improvement in symptoms so as ready for discharge   Short Term Goals: Ability to identify changes in lifestyle to reduce recurrence of condition will improve Ability to verbalize feelings will improve Ability to disclose and discuss suicidal ideas Ability to demonstrate self-control will improve Ability to identify and develop effective coping behaviors will improve Ability to maintain clinical measurements within normal limits will improve Compliance with prescribed medications will improve Ability to identify  triggers associated with substance abuse/mental health issues will improve  Medication Management: Evaluate patient's response, side effects, and tolerance of medication regimen.  Therapeutic Interventions: 1 to 1 sessions, Unit Group sessions and Medication administration.  Evaluation of Outcomes: Progressing  Physician Treatment Plan for Secondary Diagnosis: Principal Problem:   Bipolar disorder with severe depression (HCC) Active Problems:   Arthritis   Amphetamine abuse (HCC)  Long Term Goal(s): Improvement in symptoms so as ready for discharge   Short  Term Goals: Ability to identify changes in lifestyle to reduce recurrence of condition will improve Ability to verbalize feelings will improve Ability to disclose and discuss suicidal ideas Ability to demonstrate self-control will improve Ability to identify and develop effective coping behaviors will improve Ability to maintain clinical measurements within normal limits will improve Compliance with prescribed medications will improve Ability to identify triggers associated with substance abuse/mental health issues will improve     Medication Management: Evaluate patient's response, side effects, and tolerance of medication regimen.  Therapeutic Interventions: 1 to 1 sessions, Unit Group sessions and Medication administration.  Evaluation of Outcomes: Progressing   RN Treatment Plan for Primary Diagnosis: Bipolar disorder with severe depression (HCC) Long Term Goal(s): Knowledge of disease and therapeutic regimen to maintain health will improve  Short Term Goals: Ability to remain free from injury will improve, Ability to verbalize frustration and anger appropriately will improve, Ability to demonstrate self-control, Ability to participate in decision making will improve, Ability to verbalize feelings will improve, Ability to disclose and discuss suicidal ideas, Ability to identify and develop effective coping behaviors will improve, and Compliance with prescribed medications will improve  Medication Management: RN will administer medications as ordered by provider, will assess and evaluate patient's response and provide education to patient for prescribed medication. RN will report any adverse and/or side effects to prescribing provider.  Therapeutic Interventions: 1 on 1 counseling sessions, Psychoeducation, Medication administration, Evaluate responses to treatment, Monitor vital signs and CBGs as ordered, Perform/monitor CIWA, COWS, AIMS and Fall Risk screenings as ordered, Perform wound  care treatments as ordered.  Evaluation of Outcomes: Progressing   LCSW Treatment Plan for Primary Diagnosis: Bipolar disorder with severe depression (HCC) Long Term Goal(s): Safe transition to appropriate next level of care at discharge, Engage patient in therapeutic group addressing interpersonal concerns.  Short Term Goals: Engage patient in aftercare planning with referrals and resources, Increase social support, Increase ability to appropriately verbalize feelings, Increase emotional regulation, Facilitate patient progression through stages of change regarding substance use diagnoses and concerns, Identify triggers associated with mental health/substance abuse issues, and Increase skills for wellness and recovery  Therapeutic Interventions: Assess for all discharge needs, 1 to 1 time with Social worker, Explore available resources and support systems, Assess for adequacy in community support network, Educate family and significant other(s) on suicide prevention, Complete Psychosocial Assessment, Interpersonal group therapy.  Evaluation of Outcomes: Progressing   Progress in Treatment: Attending groups: Yes. Participating in groups: No. Taking medication as prescribed: Yes. Toleration medication: Yes. Family/Significant other contact made: Yes, individual(s) contacted:  pt declined Patient understands diagnosis: Yes. Discussing patient identified problems/goals with staff: No. Medical problems stabilized or resolved: Yes. Denies suicidal/homicidal ideation: Yes. Issues/concerns per patient self-inventory: No. Other: none.  New problem(s) identified: No, Describe:  none.  New Short Term/Long Term Goal(s): detox, medication management for mood stabilization; elimination of SI thoughts; development of comprehensive mental wellness/sobriety plan. Update 05/09/21: No changes at this time  Patient Goals: "I only came here to make a  phone call because I told my friend I would give it  another shot." Update 05/09/21: No changes at this time  Discharge Plan or Barriers: CSW will assist pt with development of an appropriate aftercare/discharge plan. 05/09/21: No changes at this time  Reason for Continuation of Hospitalization: Medication stabilization Suicidal ideation  Estimated Length of Stay: 1-7 days   Attendees: Patient:  05/09/2021 10:08 AM  Physician: Les Pou, MD 05/09/2021 10:08 AM  Nursing:  05/09/2021 10:08 AM  RN Care Manager: 05/09/2021 10:08 AM  Social Worker: Vilma Meckel. Algis Greenhouse, MSW, LCSW, LCAS 05/09/2021 10:08 AM  Recreational Therapist:  05/09/2021 10:08 AM  Other: Penni Homans, MSW, LCSW 05/09/2021 10:08 AM  Other: Kamauri Denardo [place], MSW, LCSW-A 05/09/2021 10:08 AM  Other: Gabriel Cirri, NP 05/09/2021 10:08 AM    Scribe for Treatment Team: Trixie Maclaren A [place], LCSWA 05/09/2021 10:08 AM

## 2021-05-09 NOTE — Progress Notes (Signed)
Patient denies SI/HI, denies A/V hallucinations. Patient verbalizes understanding of discharge instructions, follow up care and prescriptions. Patient given all belongings from [facility] locker. Patient escorted out by staff, transported by safe transport. 

## 2021-05-09 NOTE — Progress Notes (Signed)
Recreation Therapy Notes     Date: 05/09/2021  Time: 9:30 am   Location: Craft room   Behavioral response: N/A   Intervention Topic: Stress Management    Discussion/Intervention: Patient did not attend group.   Clinical Observations/Feedback:  Patient did not attend group.   Deverick Pruss LRT/CTRS        Coren Crownover 05/09/2021 11:13 AM

## 2021-05-09 NOTE — Discharge Summary (Signed)
Physician Discharge Summary Note  Patient:  Cory Floyd is an 45 y.o., male MRN:  [id number] DOB:  [birth year] Patient phone:  [phone] (home)  Patient address:   [address] [place] [address],  Total Time spent with patient: 35 minutes- 25 minutes face-to-face contact with patient, 10 minutes documentation, coordination of care, scripts   Date of Admission:  05/02/2021 Date of Discharge: 05/09/2021  Reason for Admission:  45 year old male with history of bipolar disorder and polysubstance abuse presenting to emergency room for suicidal ideations.  Principal Problem: Bipolar disorder with severe depression [facility](HCC) Discharge Diagnoses: Principal Problem:   Bipolar disorder with severe depression (HCC) Active Problems:   Arthritis   Amphetamine abuse (HCC)   Past Psychiatric History: Longstanding history of both mood and substance abuse problems.  Recently hospitalized last month.  [place].  Recently comes to the emergency room.  Prior suicide attempts via overdose.  Past Medical History:  Past Medical History:  Diagnosis Date   Alcoholic (HCC)    clean for 3 years   Anginal pain (HCC)    Arthritis    Bipolar 1 disorder (HCC)    COVID-19    Depressed    Diabetes mellitus without complication (HCC)    Dyspnea    Fatty liver    Hypertension    Schizophrenia (HCC)     Past Surgical History:  Procedure Laterality Date   COLONOSCOPY  02/05/2014   diverticulosis, hyperplastic polyp x 2   fracture arm Right    KNEE SURGERY Right 2013   NASAL SEPTUM SURGERY     NASAL TURBINATE REDUCTION Bilateral 12/12/2016   Procedure: TURBINATE REDUCTION/SUBMUCOSAL RESECTION;  Surgeon: Linus Salmonshapman [name], MD;  Location: [facility] ORS;  Service: ENT;  Laterality: Bilateral;   Family History:  Family History  Problem Relation Age of Onset   Hepatitis C Mother    Cancer Mother    Cancer Maternal Aunt    Hypertension Maternal Grandmother    Family Psychiatric   History: Reports he has an uncle and aunt who completed suicide. Social History:  Social History   Substance and Sexual Activity  Alcohol Use Yes   Comment: 1/2-5th pint of liquor     Social History   Substance and Sexual Activity  Drug Use Yes   Types: Cocaine, Marijuana   Comment: heroin    Social History   Socioeconomic History   Marital status: Divorced    Spouse name: Not on file   Number of children: Not on file   Years of education: Not on file   Highest education level: Not on file  Occupational History   Not on file  Tobacco Use   Smoking status: Every Day    Packs/day: 1.00    Years: 20.00    Pack years: 20.00    Types: Cigarettes   Smokeless tobacco: Never  Vaping Use   Vaping Use: Never used  Substance and Sexual Activity   Alcohol use: Yes    Comment: 1/2-5th pint of liquor   Drug use: Yes    Types: Cocaine, Marijuana    Comment: heroin   Sexual activity: Yes    Birth control/protection: None  Other Topics Concern   Not on file  Social History Narrative   Not on file   Social Determinants of Health   Financial Resource Strain: Not on file  Food Insecurity: Not on file  Transportation Needs: Not on file  Physical Activity: Not on file  Stress: Not  on file  Social Connections: Not on file    Hospital Course: 45 year old male who presented to the hospital with suicidal ideations with plan to overdose with illicit drugs.  While here he was restarted on oral Abilify and Depakote.  He is also given Abilify maintainer 400 mg IM injection.  His next injection is due August 11.  Valproic acid level somewhat subtherapeutic at 51.  However, patient is clinically euthymic and will not increase.  He denies any suicidal ideations, homicidal ideations, visual hallucinations, auditory hallucinations.  He feels safe to discharge home at this time and follow-up with his ACT team.  Physical Findings: AIMS: Facial and Oral Movements Muscles of Facial Expression:  None, normal Lips and Perioral Area: None, normal Jaw: None, normal Tongue: None, normal,Extremity Movements Upper (arms, wrists, hands, fingers): None, normal Lower (legs, knees, ankles, toes): None, normal, Trunk Movements Neck, shoulders, hips: None, normal, Overall Severity Severity of abnormal movements (highest score from questions above): None, normal Incapacitation due to abnormal movements: None, normal Patient's awareness of abnormal movements (rate only patient's report): No Awareness, Dental Status Current problems with teeth and/or dentures?: No Does patient usually wear dentures?: No  CIWA:    COWS:     Musculoskeletal: Strength & Muscle Tone: within normal limits Gait & Station: normal Patient leans: N/A   Psychiatric Specialty Exam:  Presentation  General Appearance: Casual; Appropriate for Environment  Eye Contact:Good  Speech:Normal Rate  Speech Volume:Normal  Handedness:Right   Mood and Affect  Mood:Euthymic  Affect:Congruent   Thought Process  Thought Processes:Goal Directed; Coherent  Descriptions of Associations:Intact  Orientation:Full (Time, Place and Person)  Thought Content:Logical  History of Schizophrenia/Schizoaffective disorder:No  Duration of Psychotic Symptoms:Less than six months  Hallucinations:Hallucinations: None  Ideas of Reference:None  Suicidal Thoughts:Suicidal Thoughts: No  Homicidal Thoughts:Homicidal Thoughts: No   Sensorium  Memory:Immediate Fair; Recent Fair; Remote Fair  Judgment:Intact  Insight:Present   Executive Functions  Concentration:Fair  Attention Span:Fair  Recall:Fair  Fund of Knowledge:Fair  Language:Fair   Psychomotor Activity  Psychomotor Activity:Psychomotor Activity: Normal   Assets  Assets:Communication Skills; Desire for Improvement; Financial Resources/Insurance; Physical Health; Resilience; Social Support   Sleep  Sleep:Sleep: Good Number of Hours of Sleep:  8.5    Physical Exam: Physical Exam Vitals and nursing note reviewed.  Constitutional:      Appearance: Normal appearance.  HENT:     Head: Normocephalic and atraumatic.     Right Ear: External ear normal.     Left Ear: External ear normal.     Nose: Nose normal.     Mouth/Throat:     Mouth: Mucous membranes are moist.     Pharynx: Oropharynx is clear.  Eyes:     Extraocular Movements: Extraocular movements intact.     Conjunctiva/sclera: Conjunctivae normal.     Pupils: Pupils are equal, round, and reactive to light.  Cardiovascular:     Rate and Rhythm: Normal rate.     Pulses: Normal pulses.  Pulmonary:     Effort: Pulmonary effort is normal.     Breath sounds: Normal breath sounds.  Abdominal:     General: Abdomen is flat.     Palpations: Abdomen is soft.  Musculoskeletal:        General: No swelling. Normal range of motion.     Cervical back: Normal range of motion and neck supple.  Skin:    General: Skin is warm and dry.  Neurological:     General: No focal deficit present.  Mental Status: He is alert and oriented to person, place, and time.  Psychiatric:        Mood and Affect: Mood normal.        Behavior: Behavior normal.        Thought Content: Thought content normal.        Judgment: Judgment normal.   Review of Systems  Constitutional: Negative.   HENT: Negative.    Eyes: Negative.   Respiratory: Negative.    Cardiovascular: Negative.   Gastrointestinal: Negative.   Genitourinary: Negative.   Musculoskeletal: Negative.   Skin: Negative.   Neurological: Negative.   Endo/Heme/Allergies:  Positive for environmental allergies. Does not bruise/bleed easily.  Psychiatric/Behavioral:  Negative for hallucinations, memory loss, substance abuse and suicidal ideas. The patient does not have insomnia.   Blood pressure 107/73, pulse 78, temperature 97.8 F (36.6 C), temperature source Oral, resp. rate 18, height 5\' 10"  (1.778 m), weight 79.4 kg, SpO2 99 %.  Body mass index is 25.11 kg/m.   Social History   Tobacco Use  Smoking Status Every Day   Packs/day: 1.00   Years: 20.00   Pack years: 20.00   Types: Cigarettes  Smokeless Tobacco Never   Tobacco Cessation:  A prescription for an FDA-approved tobacco cessation medication was offered at discharge and the patient refused   Blood Alcohol level:  Lab Results  Component Value Date   ETH <10 05/01/2021   ETH <10 04/01/2021    Metabolic Disorder Labs:  Lab Results  Component Value Date   HGBA1C 5.5 04/02/2021   MPG 111 04/02/2021   MPG 119.76 01/23/2021   No results found for: PROLACTIN Lab Results  Component Value Date   CHOL 195 05/04/2021   TRIG 75 05/04/2021   HDL 58 05/04/2021   CHOLHDL 3.4 05/04/2021   VLDL 15 05/04/2021   LDLCALC 122 (H) 05/04/2021   LDLCALC 81 04/02/2021    See Psychiatric Specialty Exam and Suicide Risk Assessment completed by Attending Physician prior to discharge.  Discharge destination:  Home  Is patient on multiple antipsychotic therapies at discharge:  No   Has Patient had three or more failed trials of antipsychotic monotherapy by history:  No  Recommended Plan for Multiple Antipsychotic Therapies: NA  Discharge Instructions     Diet general   Complete by: As directed    Increase activity slowly   Complete by: As directed       Allergies as of 05/09/2021       Reactions   Carrot [daucus Carota] Anaphylaxis, Rash   Penicillins Anaphylaxis, Hives   Has patient had a PCN reaction causing immediate rash, facial/tongue/throat swelling, SOB or lightheadedness with hypotension: Yes Has patient had a PCN reaction causing severe rash involving mucus membranes or skin necrosis: No Has patient had a PCN reaction that required hospitalization Yes Has patient had a PCN reaction occurring within the last 10 years: No If all of the above answers are "NO", then may proceed with Cephalosporin use.        Medication List     TAKE  these medications      Indication  ARIPiprazole 10 MG tablet Commonly known as: ABILIFY Take 1 tablet (10 mg total) by mouth daily. Start taking on: May 10, 2021 What changed: additional instructions  Indication: MIXED BIPOLAR AFFECTIVE DISORDER   ARIPiprazole ER 400 MG Srer injection Commonly known as: ABILIFY MAINTENA Inject 2 mLs (400 mg total) into the muscle every 28 (twenty-eight) days. Start taking on: June 02, 2021 What changed: You were already taking a medication with the same name, and this prescription was added. Make sure you understand how and when to take each.  Indication: Manic-Depression   divalproex 500 MG 24 hr tablet Commonly known as: DEPAKOTE ER Take 2 tablets (1,000 mg total) by mouth at bedtime. What changed: additional instructions  Indication: Manic Phase of Manic-Depression, Mood stabilization   hydrOXYzine 25 MG tablet Commonly known as: ATARAX/VISTARIL Take 1 tablet (25 mg total) by mouth 3 (three) times daily as needed for anxiety.  Indication: Feeling Anxious   mirtazapine 15 MG tablet Commonly known as: REMERON Take 1 tablet (15 mg total) by mouth at bedtime. For depression/sleep  Indication: Major Depressive Disorder, Insomnia   traZODone 100 MG tablet Commonly known as: DESYREL Take 1 tablet (100 mg total) by mouth at bedtime as needed for sleep.  Indication: Trouble Sleeping         Follow-up recommendations:  Activity:  as tolerated Diet:  regular diet  Comments:  Printed 30-day scripts with 1 refill provided at discharge. Next Abilify Maintenna 400 mg IM injection due 06/02/21  Signed: Jesse Sans, MD 05/09/2021, 12:38 PM

## 2021-05-09 NOTE — BHH Suicide Risk Assessment (Signed)
[facility] Discharge Suicide Risk Assessment   Principal Problem: Bipolar disorder with severe depression (HCC) Discharge Diagnoses: Principal Problem:   Bipolar disorder with severe depression (HCC) Active Problems:   Arthritis   Amphetamine abuse (HCC)   Total Time spent with patient: 35 minutes- 25 minutes face-to-face contact with patient, 10 minutes documentation, coordination of care, scripts   Musculoskeletal: Strength & Muscle Tone: within normal limits Gait & Station: normal Patient leans: N/A  Psychiatric Specialty Exam  Presentation  General Appearance: Casual; Appropriate for Environment  Eye Contact:Good  Speech:Normal Rate  Speech Volume:Normal  Handedness:Right   Mood and Affect  Mood:Euthymic  Duration of Depression Symptoms: Greater than two weeks  Affect:Congruent   Thought Process  Thought Processes:Goal Directed; Coherent  Descriptions of Associations:Intact  Orientation:Full (Time, Place and Person)  Thought Content:Logical  History of Schizophrenia/Schizoaffective disorder:No  Duration of Psychotic Symptoms:Less than six months  Hallucinations:Hallucinations: None  Ideas of Reference:None  Suicidal Thoughts:Suicidal Thoughts: No  Homicidal Thoughts:Homicidal Thoughts: No   Sensorium  Memory:Immediate Fair; Recent Fair; Remote Fair  Judgment:Intact  Insight:Present   Executive Functions  Concentration:Fair  Attention Span:Fair  Recall:Fair  Fund of Knowledge:Fair  Language:Fair   Psychomotor Activity  Psychomotor Activity:Psychomotor Activity: Normal   Assets  Assets:Communication Skills; Desire for Improvement; Financial Resources/Insurance; Physical Health; Resilience; Social Support   Sleep  Sleep:Sleep: Good Number of Hours of Sleep: 8.5   Physical Exam: Physical Exam ROS Blood pressure 107/73, pulse 78, temperature 97.8 F (36.6 C), temperature source Oral, resp. rate 18, height 5\' 10"  (1.778 m),  weight 79.4 kg, SpO2 99 %. Body mass index is 25.11 kg/m.  Mental Status Per Nursing Assessment::   On Admission:  Suicidal ideation indicated by patient  Demographic Factors:  Male and Caucasian  Loss Factors: NA  Historical Factors: Impulsivity  Risk Reduction Factors:   Employed, Positive social support, Positive therapeutic relationship, and Positive coping skills or problem solving skills  Continued Clinical Symptoms:  Bipolar Disorder:   Depressive phase Previous Psychiatric Diagnoses and Treatments  Cognitive Features That Contribute To Risk:  None    Suicide Risk:  Minimal: No identifiable suicidal ideation.  Patients presenting with no risk factors but with morbid ruminations; may be classified as minimal risk based on the severity of the depressive symptoms    Plan Of Care/Follow-up recommendations:  Activity:  as tolerated Diet:  regular diet  [phone], MD 05/09/2021, 12:37 PM

## 2021-05-26 ENCOUNTER — Emergency Department (HOSPITAL_COMMUNITY)
Admission: EM | Admit: 2021-05-26 | Discharge: 2021-05-27 | Disposition: A | Discharge status: Home / Self Care | Payer: Medicare (Managed Care) | Attending: Emergency Medicine | Admitting: Emergency Medicine

## 2021-05-26 ENCOUNTER — Encounter (HOSPITAL_COMMUNITY): Payer: Self-pay | Admitting: Emergency Medicine

## 2021-05-26 DIAGNOSIS — Z8616 Personal history of COVID-19: Secondary | ICD-10-CM | POA: Insufficient documentation

## 2021-05-26 DIAGNOSIS — I10 Essential (primary) hypertension: Secondary | ICD-10-CM | POA: Insufficient documentation

## 2021-05-26 DIAGNOSIS — E860 Dehydration: Secondary | ICD-10-CM | POA: Insufficient documentation

## 2021-05-26 DIAGNOSIS — F17210 Nicotine dependence, cigarettes, uncomplicated: Secondary | ICD-10-CM | POA: Diagnosis not present

## 2021-05-26 DIAGNOSIS — R110 Nausea: Secondary | ICD-10-CM | POA: Diagnosis present

## 2021-05-26 DIAGNOSIS — R5383 Other fatigue: Secondary | ICD-10-CM | POA: Insufficient documentation

## 2021-05-26 DIAGNOSIS — R531 Weakness: Secondary | ICD-10-CM | POA: Insufficient documentation

## 2021-05-26 DIAGNOSIS — E119 Type 2 diabetes mellitus without complications: Secondary | ICD-10-CM | POA: Insufficient documentation

## 2021-05-26 LAB — CBC WITH DIFFERENTIAL/PLATELET
Abs Immature Granulocytes: 0 10*3/uL (ref 0.00–0.07)
Basophils Absolute: 0 10*3/uL (ref 0.0–0.1)
Basophils Relative: 1 %
Eosinophils Absolute: 0.1 10*3/uL (ref 0.0–0.5)
Eosinophils Relative: 2 %
HCT: 37.1 % — ABNORMAL LOW (ref 39.0–52.0)
Hemoglobin: 12.1 g/dL — ABNORMAL LOW (ref 13.0–17.0)
Immature Granulocytes: 0 %
Lymphocytes Relative: 27 %
Lymphs Abs: 1 10*3/uL (ref 0.7–4.0)
MCH: 30.3 pg (ref 26.0–34.0)
MCHC: 32.6 g/dL (ref 30.0–36.0)
MCV: 93 fL (ref 80.0–100.0)
Monocytes Absolute: 0.3 10*3/uL (ref 0.1–1.0)
Monocytes Relative: 9 %
Neutro Abs: 2.3 10*3/uL (ref 1.7–7.7)
Neutrophils Relative %: 61 %
Platelets: 159 10*3/uL (ref 150–400)
RBC: 3.99 MIL/uL — ABNORMAL LOW (ref 4.22–5.81)
RDW: 13.1 % (ref 11.5–15.5)
WBC: 3.7 10*3/uL — ABNORMAL LOW (ref 4.0–10.5)
nRBC: 0 % (ref 0.0–0.2)

## 2021-05-26 LAB — COMPREHENSIVE METABOLIC PANEL
ALT: 26 U/L (ref 0–44)
AST: 27 U/L (ref 15–41)
Albumin: 3.9 g/dL (ref 3.5–5.0)
Alkaline Phosphatase: 61 U/L (ref 38–126)
Anion gap: 8 (ref 5–15)
BUN: 21 mg/dL — ABNORMAL HIGH (ref 6–20)
CO2: 27 mmol/L (ref 22–32)
Calcium: 9 mg/dL (ref 8.9–10.3)
Chloride: 106 mmol/L (ref 98–111)
Creatinine, Ser: 1.13 mg/dL (ref 0.61–1.24)
GFR, Estimated: >60 mL/min (ref >60)
Glucose, Bld: 105 mg/dL — ABNORMAL HIGH (ref 70–99)
Potassium: 3.6 mmol/L (ref 3.5–5.1)
Sodium: 141 mmol/L (ref 135–145)
Total Bilirubin: 0.4 mg/dL (ref 0.3–1.2)
Total Protein: 6.9 g/dL (ref 6.5–8.1)

## 2021-05-26 MED ORDER — ONDANSETRON HCL 4 MG/2ML IJ SOLN
4.0000 mg | Freq: Once | INTRAMUSCULAR | Status: AC
  Administered 2021-05-26: 4 mg via INTRAVENOUS
  Filled 2021-05-26: qty 2

## 2021-05-26 MED ORDER — LACTATED RINGERS IV BOLUS
1500.0000 mL | Freq: Once | INTRAVENOUS | Status: AC
  Administered 2021-05-26: 1500 mL via INTRAVENOUS

## 2021-05-26 NOTE — ED Notes (Signed)
Patient could not stand for 3 mins for a complete set or orthostatic vitals.
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
## 2021-05-26 NOTE — ED Triage Notes (Signed)
[place] c/o dehydration, weakness, nausea after walking for long duration. Orthostatic changes with EMS. 18G LAC. 250 cc NS given by EMS with no significant improvement in BP.

## 2021-05-26 NOTE — Discharge Instructions (Addendum)
Please come back to the emergency department with any new or worsening symptoms.  It was a pleasure to meet you.

## 2021-06-05 ENCOUNTER — Emergency Department (HOSPITAL_COMMUNITY)
Admission: EM | Admit: 2021-06-05 | Discharge: 2021-06-05 | Disposition: A | Discharge status: Home / Self Care | Payer: Medicare (Managed Care) | Attending: Emergency Medicine | Admitting: Emergency Medicine

## 2021-06-05 ENCOUNTER — Emergency Department (HOSPITAL_COMMUNITY): Payer: Medicare (Managed Care)

## 2021-06-05 ENCOUNTER — Encounter (HOSPITAL_COMMUNITY): Payer: Self-pay

## 2021-06-05 ENCOUNTER — Other Ambulatory Visit: Payer: Self-pay

## 2021-06-05 DIAGNOSIS — I10 Essential (primary) hypertension: Secondary | ICD-10-CM | POA: Insufficient documentation

## 2021-06-05 DIAGNOSIS — Z23 Encounter for immunization: Secondary | ICD-10-CM | POA: Insufficient documentation

## 2021-06-05 DIAGNOSIS — F17210 Nicotine dependence, cigarettes, uncomplicated: Secondary | ICD-10-CM | POA: Diagnosis not present

## 2021-06-05 DIAGNOSIS — Z8616 Personal history of COVID-19: Secondary | ICD-10-CM | POA: Diagnosis not present

## 2021-06-05 DIAGNOSIS — E119 Type 2 diabetes mellitus without complications: Secondary | ICD-10-CM | POA: Diagnosis not present

## 2021-06-05 DIAGNOSIS — S6991XA Unspecified injury of right wrist, hand and finger(s), initial encounter: Secondary | ICD-10-CM | POA: Diagnosis present

## 2021-06-05 DIAGNOSIS — S61214A Laceration without foreign body of right ring finger without damage to nail, initial encounter: Secondary | ICD-10-CM | POA: Insufficient documentation

## 2021-06-05 MED ORDER — TETANUS-DIPHTH-ACELL PERTUSSIS 5-2.5-18.5 LF-MCG/0.5 IM SUSY
0.5000 mL | PREFILLED_SYRINGE | Freq: Once | INTRAMUSCULAR | Status: AC
  Administered 2021-06-05: 0.5 mL via INTRAMUSCULAR
  Filled 2021-06-05: qty 0.5

## 2021-06-05 MED ORDER — LIDOCAINE HCL (PF) 1 % IJ SOLN
30.0000 mL | Freq: Once | INTRAMUSCULAR | Status: AC
  Administered 2021-06-05: 30 mL
  Filled 2021-06-05: qty 30

## 2021-06-05 NOTE — ED Notes (Signed)
Cleaned hand with ns. Then used non stick gauze and wrapped with gauze.
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
## 2021-06-05 NOTE — Discharge Instructions (Addendum)
The hand surgeon, Dr. Janee Morn, will contact you for an appointment.  You can call his office if you do not hear from him on Monday or Tuesday.  You will likely need to have your tendon repaired.  Keep the finger in the splint until then.

## 2021-06-06 ENCOUNTER — Other Ambulatory Visit: Payer: Self-pay | Admitting: Orthopedic Surgery

## 2021-06-07 ENCOUNTER — Encounter (HOSPITAL_COMMUNITY): Payer: Self-pay | Admitting: Physician Assistant

## 2021-06-07 ENCOUNTER — Encounter (HOSPITAL_COMMUNITY): Payer: Self-pay | Admitting: Orthopedic Surgery

## 2021-06-07 NOTE — Progress Notes (Signed)
This patient was added on this afternoon for surgery tomorrow. We've attempted several times to contact pt but one number rings 2 times and then goes to a busy signal. Another number that was listed in "questions" was tried and it rang, but no voicemail available. Called Dr. Carollee Massed office this afternoon and spoke with his scheduler and she stated that she had not been able to contact him either. In fact, pt did not even know he was scheduled for surgery. She did say if she could not reach him by the end of the day she would him off the schedule, but he is still on the schedule.

## 2021-06-08 ENCOUNTER — Ambulatory Visit (HOSPITAL_COMMUNITY)
Admission: RE | Admit: 2021-06-08 | Payer: Medicare (Managed Care) | Source: Home / Self Care | Admitting: Orthopedic Surgery

## 2021-06-08 ENCOUNTER — Encounter (HOSPITAL_COMMUNITY): Admission: RE | Payer: Self-pay | Source: Home / Self Care

## 2021-06-08 SURGERY — REPAIR, TENDON, FLEXOR
Anesthesia: Monitor Anesthesia Care | Laterality: Right

## 2021-08-24 ENCOUNTER — Emergency Department (HOSPITAL_COMMUNITY)
Admission: EM | Admit: 2021-08-24 | Discharge: 2021-08-24 | Disposition: A | Discharge status: Home / Self Care | Payer: Medicare (Managed Care) | Attending: Emergency Medicine | Admitting: Emergency Medicine

## 2021-08-24 ENCOUNTER — Other Ambulatory Visit: Payer: Self-pay

## 2021-08-24 ENCOUNTER — Encounter (HOSPITAL_COMMUNITY): Payer: Self-pay | Admitting: *Deleted

## 2021-08-24 DIAGNOSIS — E119 Type 2 diabetes mellitus without complications: Secondary | ICD-10-CM | POA: Insufficient documentation

## 2021-08-24 DIAGNOSIS — Y900 Blood alcohol level of less than 20 mg/100 ml: Secondary | ICD-10-CM | POA: Diagnosis not present

## 2021-08-24 DIAGNOSIS — I10 Essential (primary) hypertension: Secondary | ICD-10-CM | POA: Insufficient documentation

## 2021-08-24 DIAGNOSIS — F1020 Alcohol dependence, uncomplicated: Secondary | ICD-10-CM | POA: Diagnosis not present

## 2021-08-24 DIAGNOSIS — R61 Generalized hyperhidrosis: Secondary | ICD-10-CM | POA: Diagnosis not present

## 2021-08-24 DIAGNOSIS — R0789 Other chest pain: Secondary | ICD-10-CM | POA: Diagnosis not present

## 2021-08-24 DIAGNOSIS — Z8616 Personal history of COVID-19: Secondary | ICD-10-CM | POA: Insufficient documentation

## 2021-08-24 DIAGNOSIS — F1990 Other psychoactive substance use, unspecified, uncomplicated: Secondary | ICD-10-CM

## 2021-08-24 DIAGNOSIS — R110 Nausea: Secondary | ICD-10-CM | POA: Diagnosis not present

## 2021-08-24 DIAGNOSIS — R000 Tachycardia, unspecified: Secondary | ICD-10-CM | POA: Insufficient documentation

## 2021-08-24 DIAGNOSIS — R42 Dizziness and giddiness: Secondary | ICD-10-CM | POA: Insufficient documentation

## 2021-08-24 DIAGNOSIS — F17210 Nicotine dependence, cigarettes, uncomplicated: Secondary | ICD-10-CM | POA: Insufficient documentation

## 2021-08-24 DIAGNOSIS — F1510 Other stimulant abuse, uncomplicated: Secondary | ICD-10-CM | POA: Diagnosis not present

## 2021-08-24 DIAGNOSIS — R10819 Abdominal tenderness, unspecified site: Secondary | ICD-10-CM | POA: Diagnosis not present

## 2021-08-24 LAB — URINALYSIS, ROUTINE W REFLEX MICROSCOPIC
Bilirubin Urine: NEGATIVE
Glucose, UA: NEGATIVE mg/dL
Hgb urine dipstick: NEGATIVE
Ketones, ur: NEGATIVE mg/dL
Leukocytes,Ua: NEGATIVE
Nitrite: NEGATIVE
Protein, ur: NEGATIVE mg/dL
Specific Gravity, Urine: 1.008 (ref 1.005–1.030)
pH: 7 (ref 5.0–8.0)

## 2021-08-24 LAB — CBC
HCT: 38 % — ABNORMAL LOW (ref 39.0–52.0)
Hemoglobin: 13 g/dL (ref 13.0–17.0)
MCH: 31.5 pg (ref 26.0–34.0)
MCHC: 34.2 g/dL (ref 30.0–36.0)
MCV: 92 fL (ref 80.0–100.0)
Platelets: 214 10*3/uL (ref 150–400)
RBC: 4.13 MIL/uL — ABNORMAL LOW (ref 4.22–5.81)
RDW: 13.9 % (ref 11.5–15.5)
WBC: 6.2 10*3/uL (ref 4.0–10.5)
nRBC: 0 % (ref 0.0–0.2)

## 2021-08-24 LAB — COMPREHENSIVE METABOLIC PANEL
ALT: 62 U/L — ABNORMAL HIGH (ref 0–44)
AST: 73 U/L — ABNORMAL HIGH (ref 15–41)
Albumin: 3.5 g/dL (ref 3.5–5.0)
Alkaline Phosphatase: 102 U/L (ref 38–126)
Anion gap: 10 (ref 5–15)
BUN: 16 mg/dL (ref 6–20)
CO2: 24 mmol/L (ref 22–32)
Calcium: 9.4 mg/dL (ref 8.9–10.3)
Chloride: 100 mmol/L (ref 98–111)
Creatinine, Ser: 1.33 mg/dL — ABNORMAL HIGH (ref 0.61–1.24)
GFR, Estimated: >60 mL/min (ref >60)
Glucose, Bld: 83 mg/dL (ref 70–99)
Potassium: 4.2 mmol/L (ref 3.5–5.1)
Sodium: 134 mmol/L — ABNORMAL LOW (ref 135–145)
Total Bilirubin: 0.9 mg/dL (ref 0.3–1.2)
Total Protein: 7.7 g/dL (ref 6.5–8.1)

## 2021-08-24 LAB — ETHANOL: Alcohol, Ethyl (B): <10 mg/dL (ref <10)

## 2021-08-24 LAB — RAPID URINE DRUG SCREEN, HOSP PERFORMED
Amphetamines: POSITIVE — AB
Barbiturates: NOT DETECTED
Benzodiazepines: NOT DETECTED
Cocaine: POSITIVE — AB
Opiates: NOT DETECTED
Tetrahydrocannabinol: NOT DETECTED

## 2021-08-24 MED ORDER — ONDANSETRON HCL 4 MG/2ML IJ SOLN
4.0000 mg | Freq: Once | INTRAMUSCULAR | Status: AC
  Administered 2021-08-24: 4 mg via INTRAVENOUS
  Filled 2021-08-24: qty 2

## 2021-08-24 MED ORDER — SODIUM CHLORIDE 0.9 % IV BOLUS
1000.0000 mL | Freq: Once | INTRAVENOUS | Status: AC
  Administered 2021-08-24: 1000 mL via INTRAVENOUS

## 2021-08-24 NOTE — Discharge Instructions (Addendum)
All of your lab work and urine sample look good today.  It does show some cocaine and meth as we expected.  It is important that you stop using drugs.

## 2021-08-24 NOTE — ED Provider Notes (Signed)
[facility] EMERGENCY DEPARTMENT Provider Note   CSN: [id number] Arrival date & time:        History Chief Complaint  Patient presents with   Chest Pain    Cory Floyd is a 45 y.o. male with a past medical history of alcohol use disorder, polysubstance dependence and mood disorder presenting today in custody due to dizziness, vomiting and drug ingestion.  Patient states that he injected meth and had 3-4 shots of alcohol this morning before turning himself into his parole officers for evaluation.  He does not usually use meth, prefers crack/cocaine.  Did some of that today too.  Currently dizzy and with nausea.  Complaining of a dry mouth.  Originally had some chest tightness with the parole officers however this has resolved.  No suicidal, homicidal ideations.  No AVH.    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   Bipolar disorder with severe depression (HCC) 05/02/2021   Bipolar 1 disorder, depressed, severe (HCC) 04/02/2021   Cocaine abuse with cocaine-induced mood disorder (HCC) 04/02/2021   Bipolar disorder (HCC) 01/24/2021   Substance induced mood disorder (HCC) 07/26/2020   Suicidal ideation    Polysubstance dependence (HCC) 07/25/2020   Bipolar affective disorder, depressed, moderate (HCC) 03/06/2020   Arthritis 09/30/2019   Gastroesophageal reflux disease without esophagitis 09/21/2016   Obstructive sleep apnea 09/21/2016   Morbid obesity with BMI of 40.0-44.9, adult (HCC) 08/15/2016   Stable angina pectoris (HCC) 08/15/2016    Past Surgical History:  Procedure Laterality Date   COLONOSCOPY  02/05/2014    diverticulosis, hyperplastic polyp x 2   fracture arm Right    KNEE SURGERY Right 2013   NASAL SEPTUM SURGERY     NASAL TURBINATE REDUCTION Bilateral 12/12/2016   Procedure: TURBINATE REDUCTION/SUBMUCOSAL RESECTION;  Surgeon: Linus Salmons, MD;  Location: [facility] ORS;  Service: ENT;  Laterality: Bilateral;       Family History  Problem Relation Age of Onset   Hepatitis C Mother    Cancer Mother    Cancer Maternal Aunt    Hypertension Maternal Grandmother     Social History   Tobacco Use   Smoking status: Every Day    Packs/day: 1.00    Years: 20.00    Pack years: 20.00    Types: Cigarettes   Smokeless tobacco: Never  Vaping Use   Vaping Use: Never used  Substance Use Topics   Alcohol use: Yes    Comment: 1/2-5th pint of liquor   Drug use: Yes    Types: Cocaine, Marijuana    Comment: heroin    Home Medications Prior to Admission medications   Medication Sig Start Date End Date Taking? Authorizing Provider  ARIPiprazole (ABILIFY) 10 MG tablet Take 1 tablet (10 mg total) by mouth daily. Patient not taking: Reported on 08/24/2021 05/10/21   Jesse Sans, MD  ARIPiprazole ER (ABILIFY MAINTENA) 400 MG SRER injection Inject 2 mLs (400 mg total) into the muscle every 28 (twenty-eight) days. Patient not taking: Reported on 08/24/2021 06/02/21  Jesse Sans, MD  divalproex (DEPAKOTE ER) 500 MG 24 hr tablet Take 2 tablets (1,000 mg total) by mouth at bedtime. Patient not taking: Reported on 08/24/2021 05/09/21   Jesse Sans, MD  hydrOXYzine (ATARAX/VISTARIL) 25 MG tablet Take 1 tablet (25 mg total) by mouth 3 (three) times daily as needed for anxiety. Patient not taking: Reported on 08/24/2021 05/09/21   Jesse Sans, MD  mirtazapine (REMERON) 15 MG tablet Take 1 tablet (15 mg total) by mouth at bedtime. For depression/sleep Patient not taking: Reported on 08/24/2021 05/09/21   Jesse Sans, MD  traZODone (DESYREL) 100 MG tablet Take 1 tablet (100 mg total) by mouth  at bedtime as needed for sleep. Patient not taking: Reported on 08/24/2021 05/09/21   Jesse Sans, MD  fluticasone [facility]) 50 MCG/ACT nasal spray Place 1 spray into both nostrils daily. Patient not taking: Reported on 01/23/2021 08/24/20 01/23/21  Petrucelli, Lelon Mast R, [name]    Allergies    Carrot [daucus carota], Penicillins, and Ampicillin  Review of Systems   Review of Systems  Constitutional:  Positive for diaphoresis.  Respiratory:  Negative for shortness of breath.   Cardiovascular:  Positive for palpitations. Negative for chest pain.  Gastrointestinal:  Positive for nausea and vomiting. Negative for abdominal pain.  Neurological:  Positive for dizziness. Negative for syncope and light-headedness.  Psychiatric/Behavioral:  Negative for self-injury and suicidal ideas. The patient is hyperactive.    Physical Exam Updated Vital Signs BP 101/87 (BP Location: Right Arm)   Pulse (!) 110   Resp (!) 23   SpO2 100%   Physical Exam Vitals and nursing note reviewed.  Constitutional:      Appearance: Normal appearance. He is diaphoretic.  HENT:     Head: Normocephalic and atraumatic.  Eyes:     General: No scleral icterus.    Conjunctiva/sclera: Conjunctivae normal.     Pupils: Pupils are equal, round, and reactive to light.  Cardiovascular:     Rate and Rhythm: Regular rhythm. Tachycardia present.     Heart sounds: Normal heart sounds. No murmur heard. Pulmonary:     Effort: Pulmonary effort is normal. No respiratory distress.     Breath sounds: No decreased breath sounds or wheezing.  Abdominal:     Tenderness: There is abdominal tenderness.  Skin:    General: Skin is warm.     Findings: No rash.  Neurological:     Mental Status: He is alert.  Psychiatric:        Mood and Affect: Mood normal.    ED Results / Procedures / Treatments   Labs (all labs ordered are listed, but only abnormal results are displayed) Labs Reviewed  COMPREHENSIVE METABOLIC PANEL  ETHANOL   CBC  RAPID URINE DRUG SCREEN, HOSP PERFORMED  URINALYSIS, ROUTINE W REFLEX MICROSCOPIC    EKG None  Radiology No results found.  Procedures Procedures   Medications Ordered in ED Medications  ondansetron (ZOFRAN) injection 4 mg (4 mg Intravenous Given 08/24/21 1437)  sodium chloride 0.9 % bolus 1,000 mL (0 mLs Intravenous Stopped 08/24/21 1538)    ED Course  I have reviewed the triage vital signs and the nursing notes.  Pertinent labs & imaging results that were available during my care of the patient were reviewed by me and considered in my medical decision making (see chart for details).    MDM Rules/Calculators/A&P Patient was evaluated by me.  He was stable and in no acute distress.  Obviously intoxicated from  multiple drugs.  Plan will be to give him a fluid bolus, check normal labs and urinalysis and discharge with parole officers if everything comes back normal. Abdominal exam elicits nausea, no acute pain. Tachycardia likely secondary to meth, cocaine and adrenaline.   Patient was reassessed. He was more alert and in no distress.  All of his lab work looks good.  No signs of infection in his urine.  He reported that he felt better, less dizzy and nauseous.  Says that he is ready to go.  Parole officers requested a copy of his discharge papers to show that he is medically clear.  Patient is medically cleared at this time and has been discharged.  Final Clinical Impression(s) / ED Diagnoses Final diagnoses:  Polysubstance use disorder    Rx / DC Orders Results and diagnoses were explained to the patient. Return precautions discussed in full. Patient had no additional questions and expressed complete understanding.     Saddie Benders, [name] 08/24/21 1815    Pricilla Loveless, MD 08/29/21 [phone]

## 2021-08-24 NOTE — ED Triage Notes (Addendum)
Pt arrives via EMS and in PD custody for parole violation.  PT is here after using ICE this am and using coc/meth- (shoots up) and started having chest pain and abdominal pain (severe burning).  Pt states he had not ate in 3 days and ate pizza last night with some beer, states he does not usually drink etoh.CBg 148, BP 180/40 and then 110/94 per ems.  Pt complaining of dizziness when he opens eyes and has a bad taste in his mouth.  No IV access currently. EMS reports sats 84 on RA on arrival and placed on 2L

## 2021-12-03 IMAGING — DX DG CHEST 1V PORT
1 series · 1 of 1 positions shown · non-contrast
Comparison: September 29, 2017

CLINICAL DATA: Shortness of breath

EXAM:
PORTABLE CHEST 1 VIEW

[chest ap]
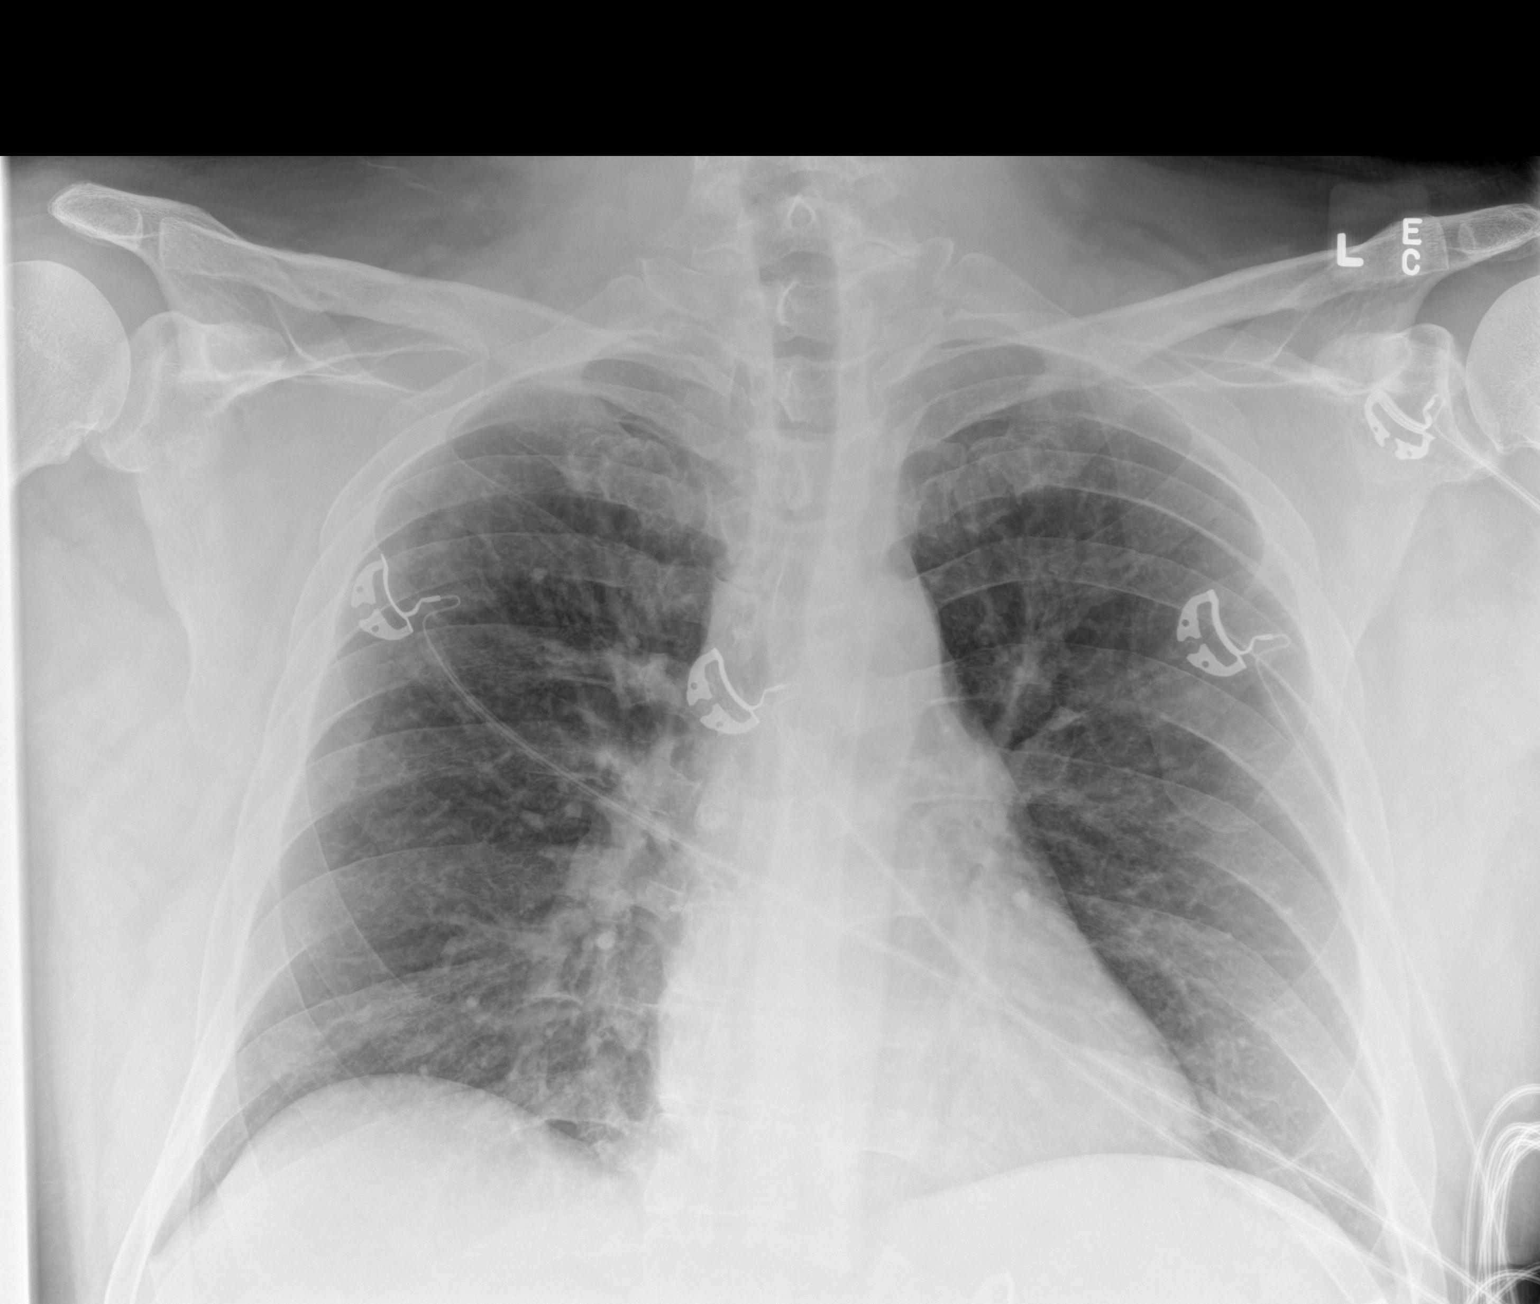

[1 of 1 positions shown; findings below may reference images not displayed]

FINDINGS: The heart size and mediastinal contours are within normal limits.
Both lungs are clear. The visualized skeletal structures are
unremarkable.
IMPRESSION: No active disease.

## 2021-12-04 IMAGING — CR DG LUMBAR SPINE 2-3V
1 series · 3 of 3 positions shown · non-contrast
Comparison: CT of the abdomen pelvis 10/18/2017

CLINICAL DATA: Fall. Mid low back pain. Generalized right knee
pain.

EXAM:
LUMBAR SPINE - 2-3 VIEW

[Series 1: dg lumbar spine 2-3 views · 0.14mm/px · 3 of 3 slices shown]
[im 1/3]
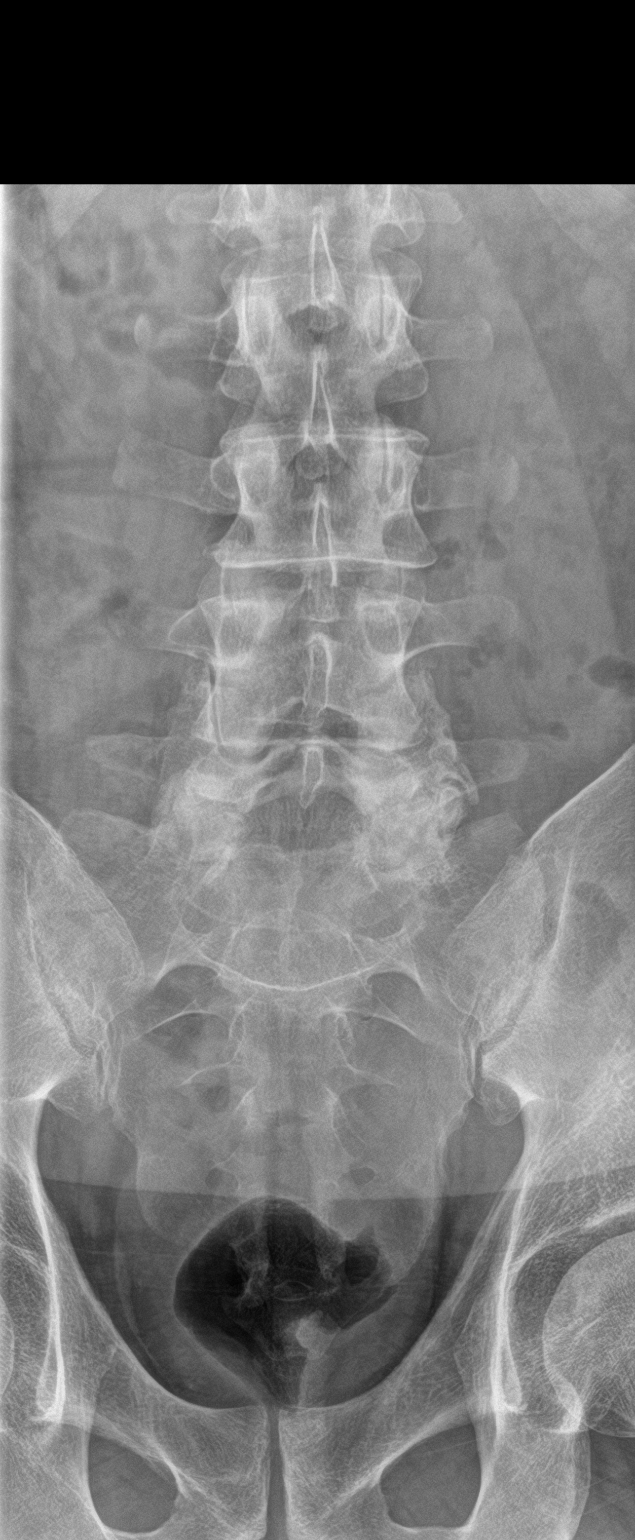
[im 2/3]
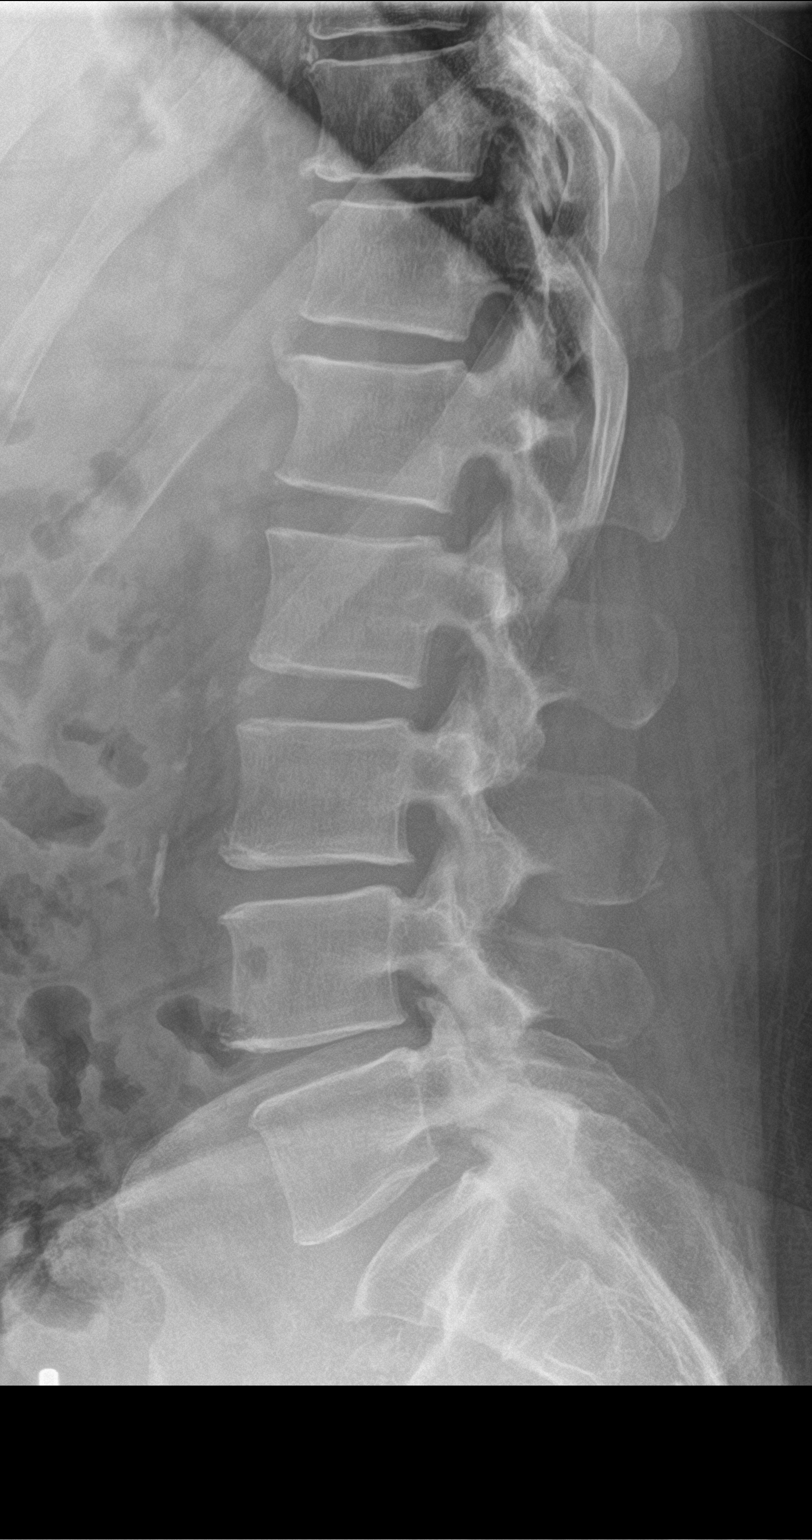
[im 3/3]
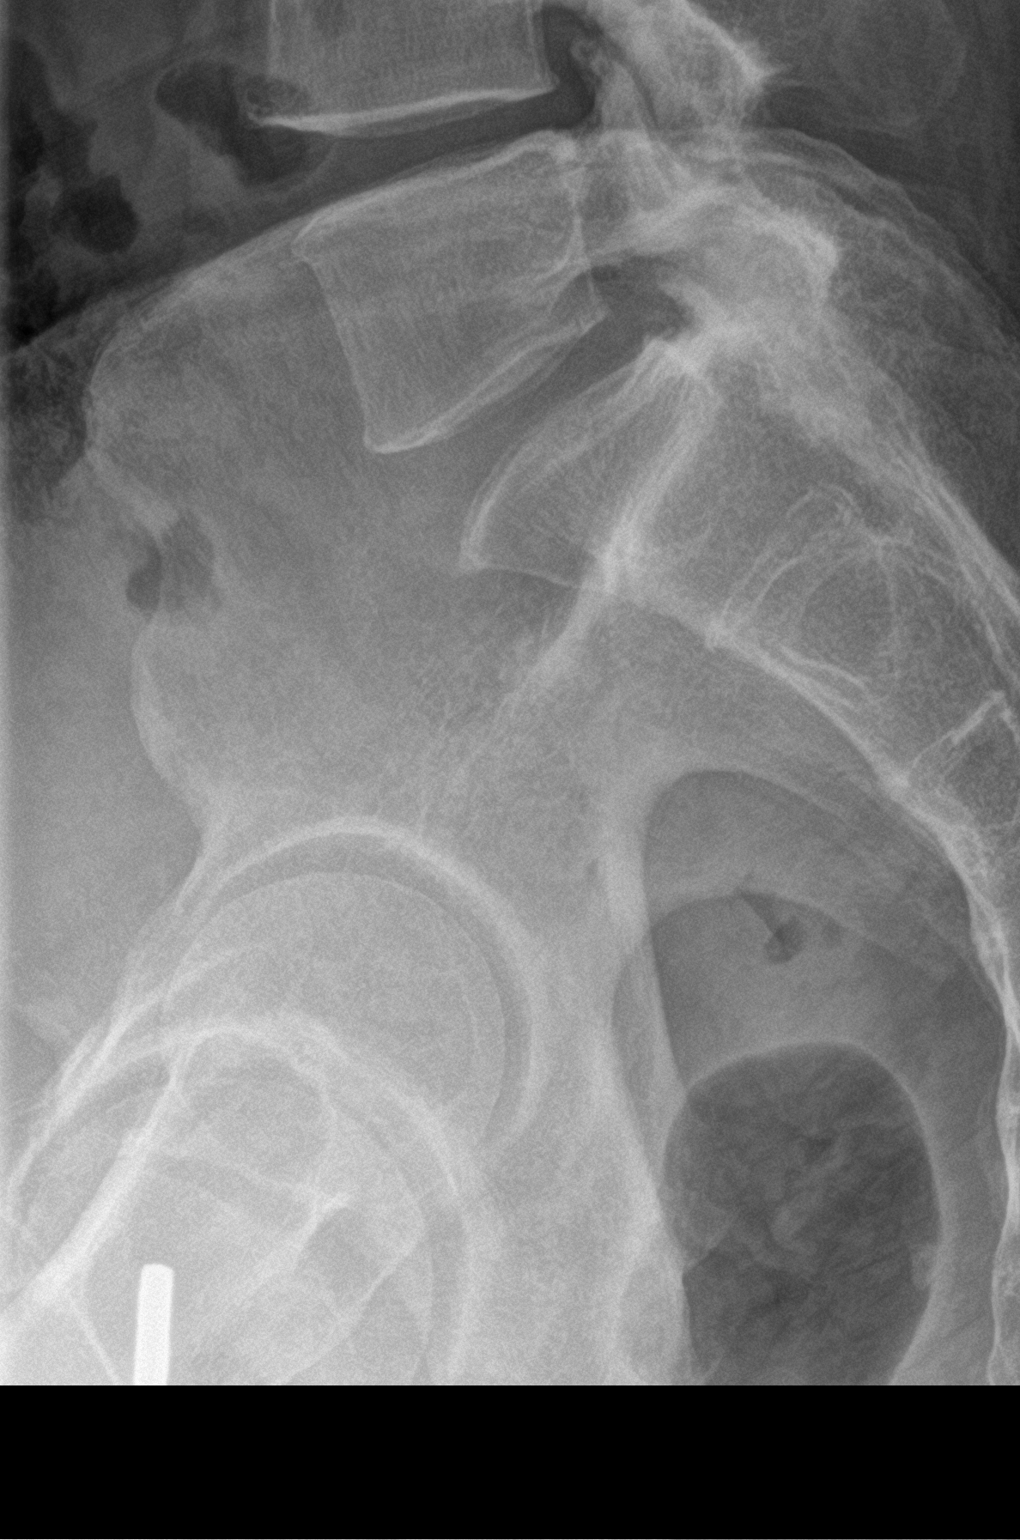

[3 of 3 positions shown; findings below may reference images not displayed]

FINDINGS: Five non rib-bearing lumbar type vertebral bodies are present.
Vertebral body heights are stable. Slight degenerative
anterolisthesis is again noted at L5-S1. Facet disease is again
noted at L4-5 and L5-S1. Atherosclerotic changes are present the
aorta.
IMPRESSION: 1. Stable appearance of degenerative changes in the lower lumbar
spine.
2. No acute or focal abnormality.
3. Atherosclerosis.

## 2021-12-04 IMAGING — CR DG KNEE 1-2V*R*
1 series · 2 of 2 positions shown · non-contrast
Comparison: MRI of the right knee 06/28/2017

CLINICAL DATA: Generalized right knee pain

EXAM:
RIGHT KNEE - 1-2 VIEW

[Series 1: dg knee 1-2 views right · 0.14mm/px · 2 of 2 slices shown]
[im 1/2]
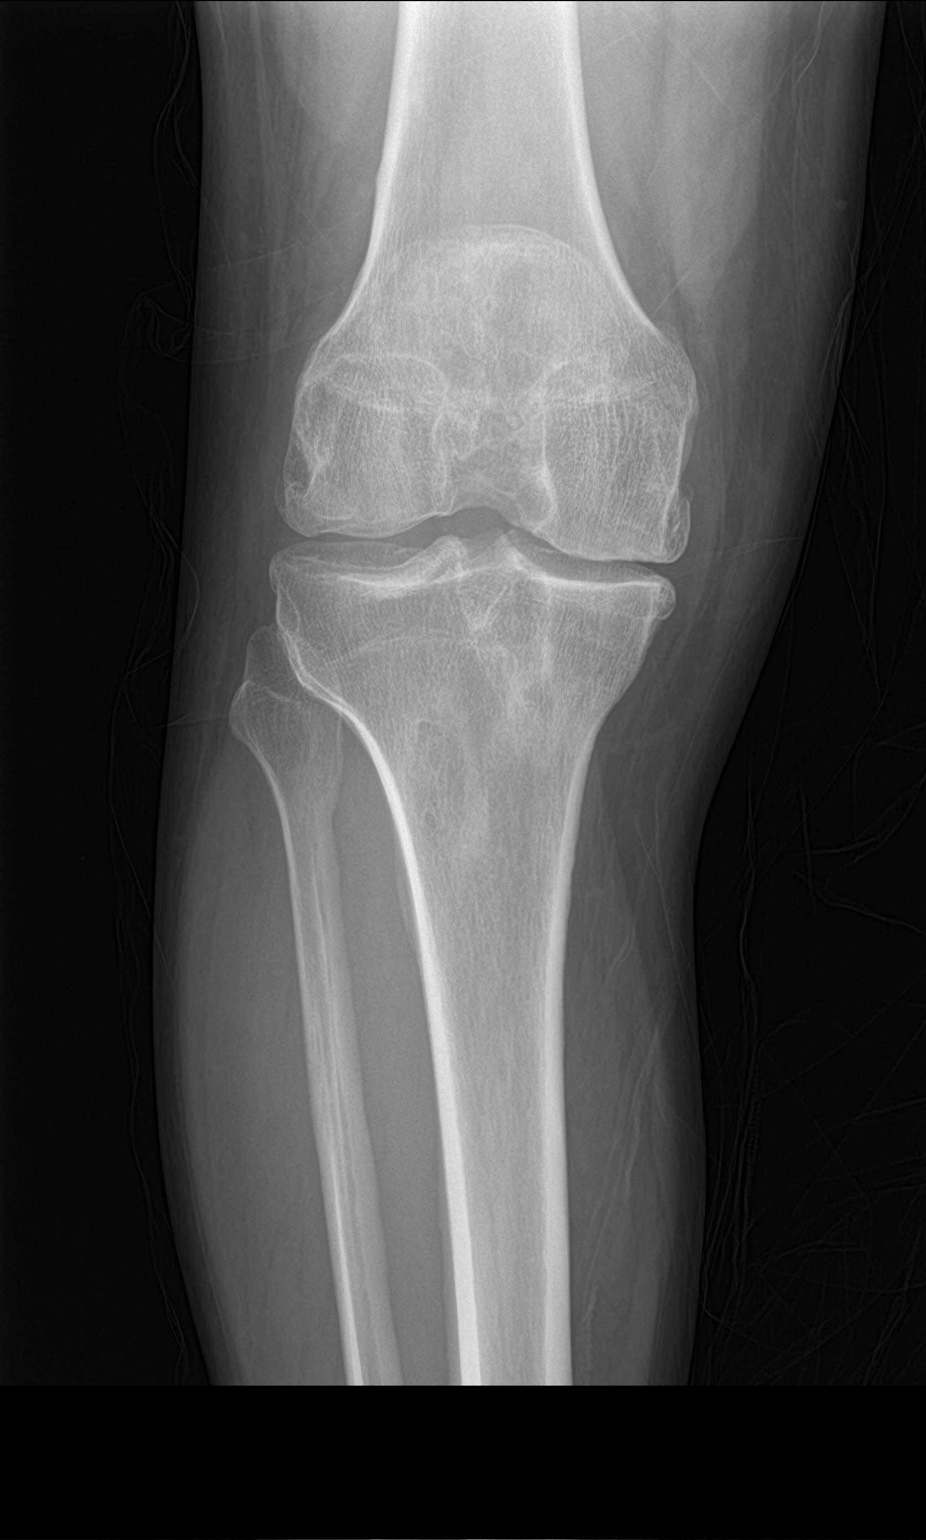
[im 2/2]
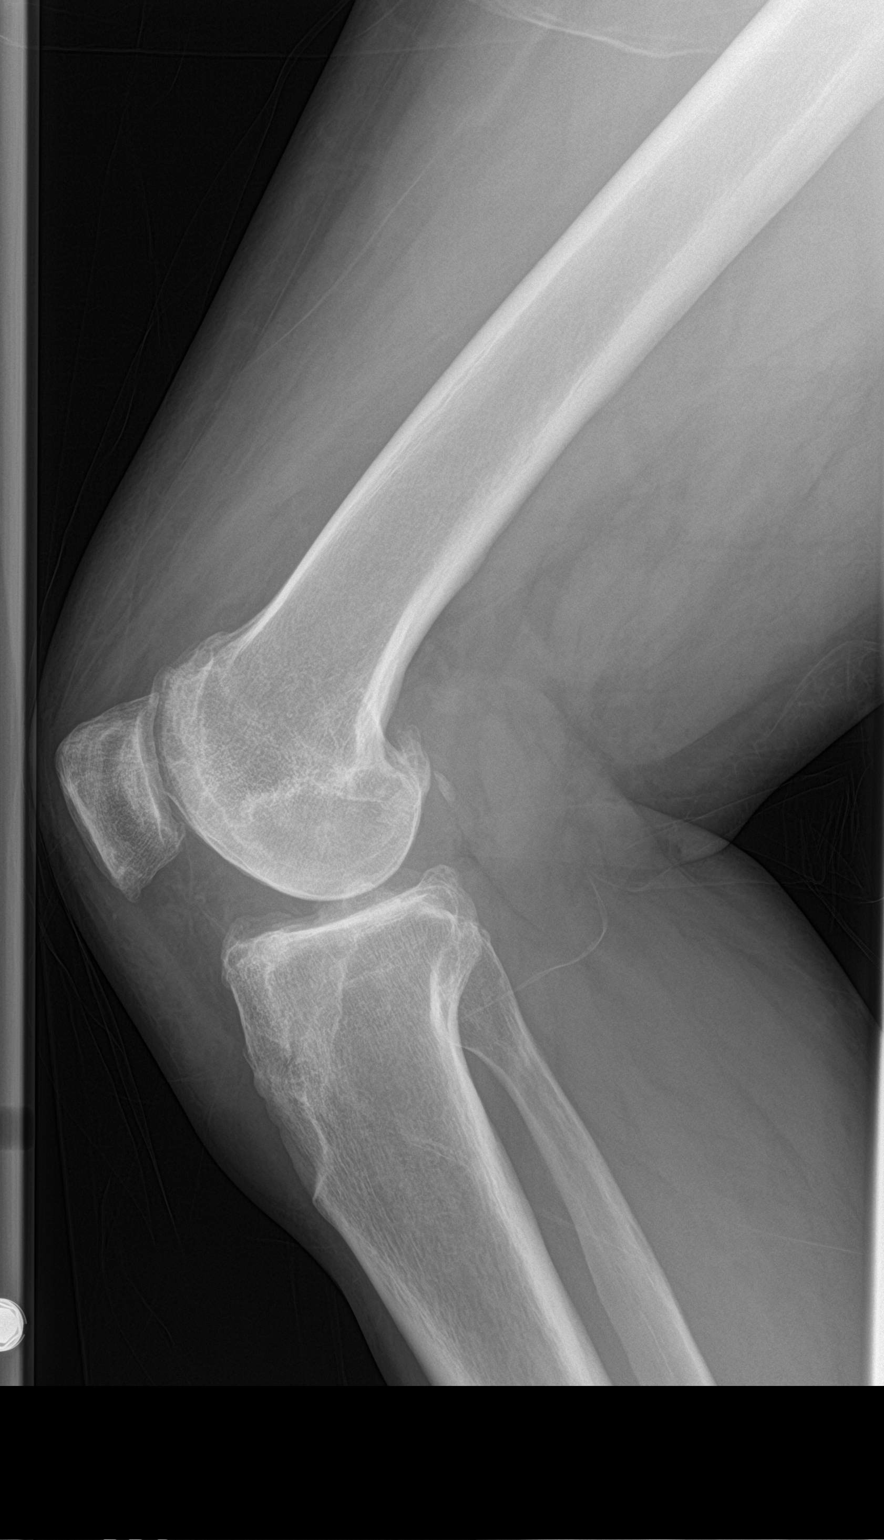

[2 of 2 positions shown; findings below may reference images not displayed]

FINDINGS: Sequela of ACL repair. Tricompartmental osteoarthritis with marginal
spurring and relative sparing at the patellofemoral compartment. No
acute fracture or malalignment. Negative for joint effusion.
IMPRESSION: 1. No acute finding.
2. Tricompartmental osteoarthritis.
3. Prior ACL repair.

## 2021-12-09 IMAGING — DX DG CHEST 1V PORT
1 series · 2 of 2 positions shown · non-contrast
Comparison: Chest radiograph 09/28/2019

CLINICAL DATA: COVID 19 positive. Hx of cocaine abuse, current
smoker.

EXAM:
PORTABLE CHEST 1 VIEW

[Series 1: chest ap · 0.14mm/px · 2 of 2 slices shown]
[im 1/2]
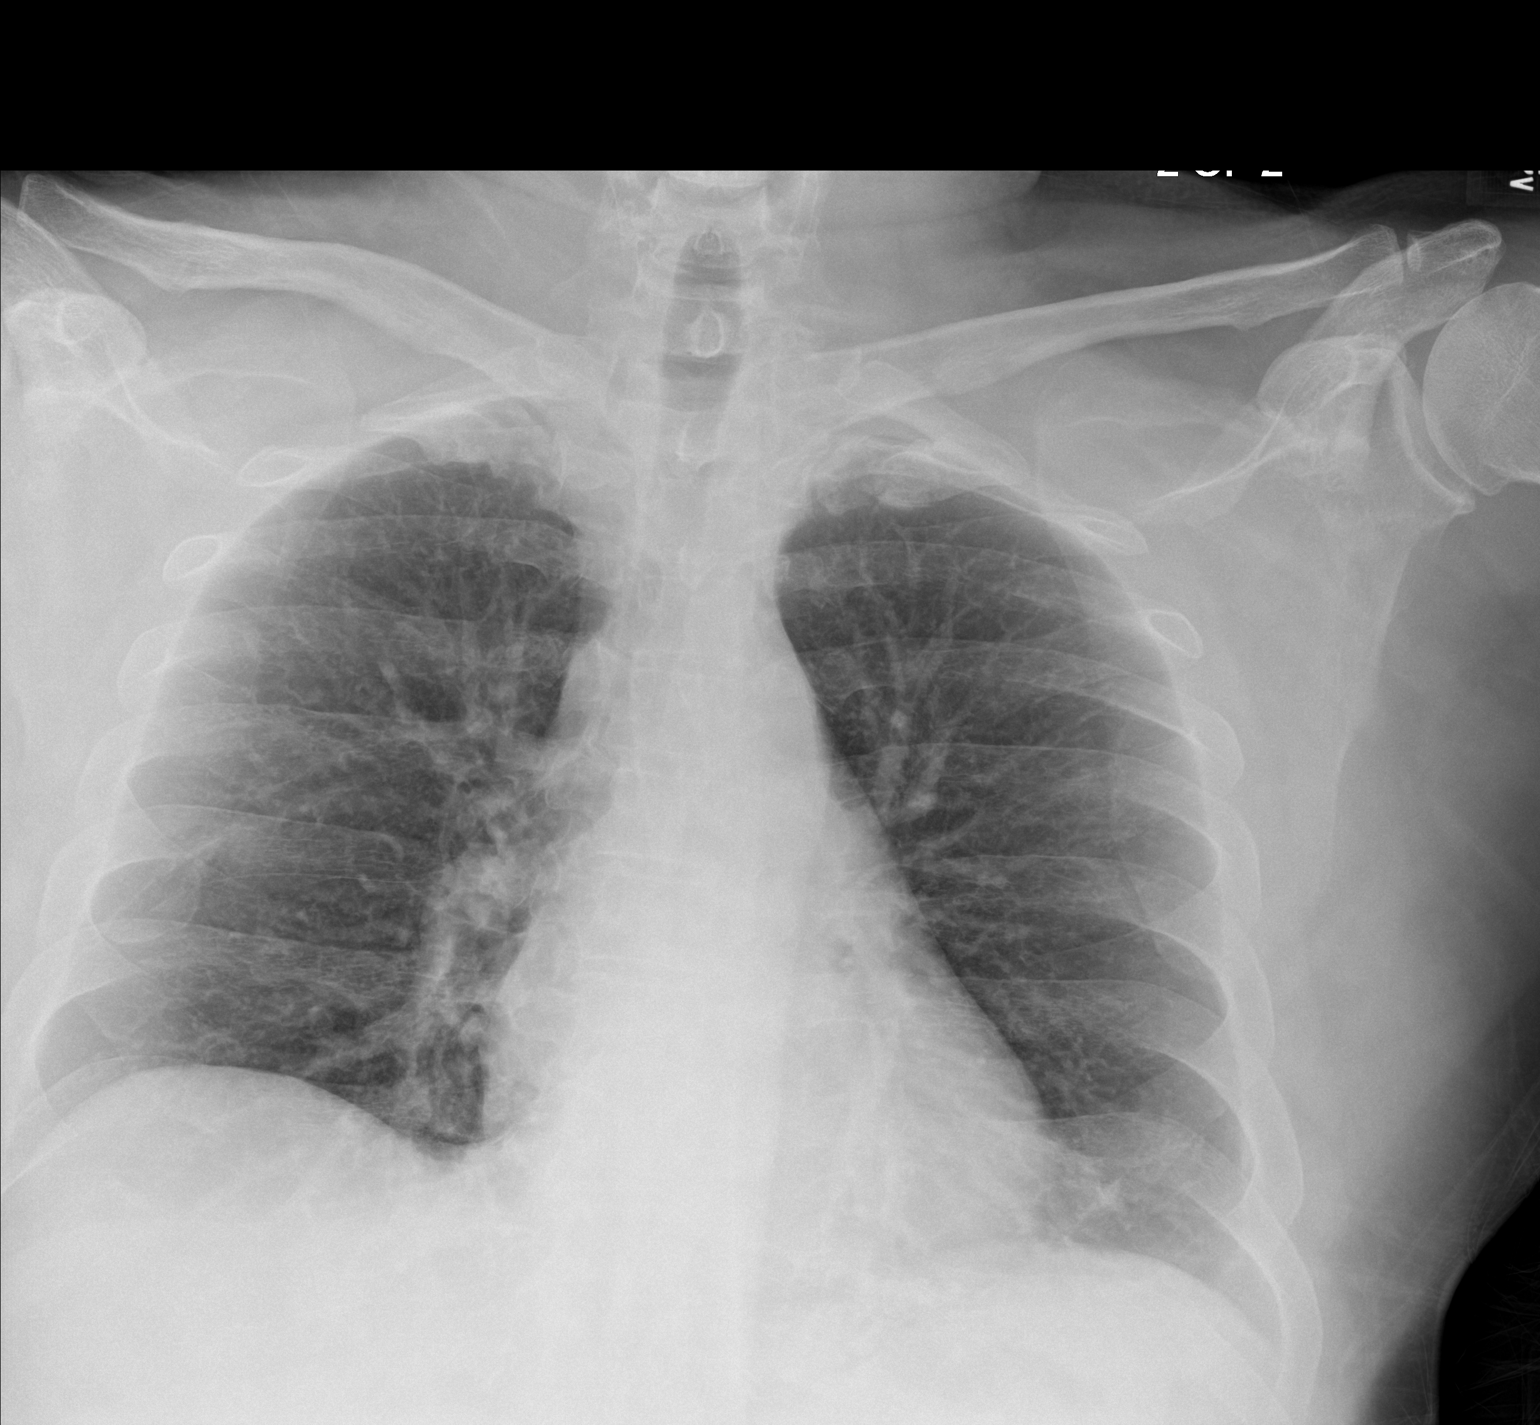
[im 2/2]
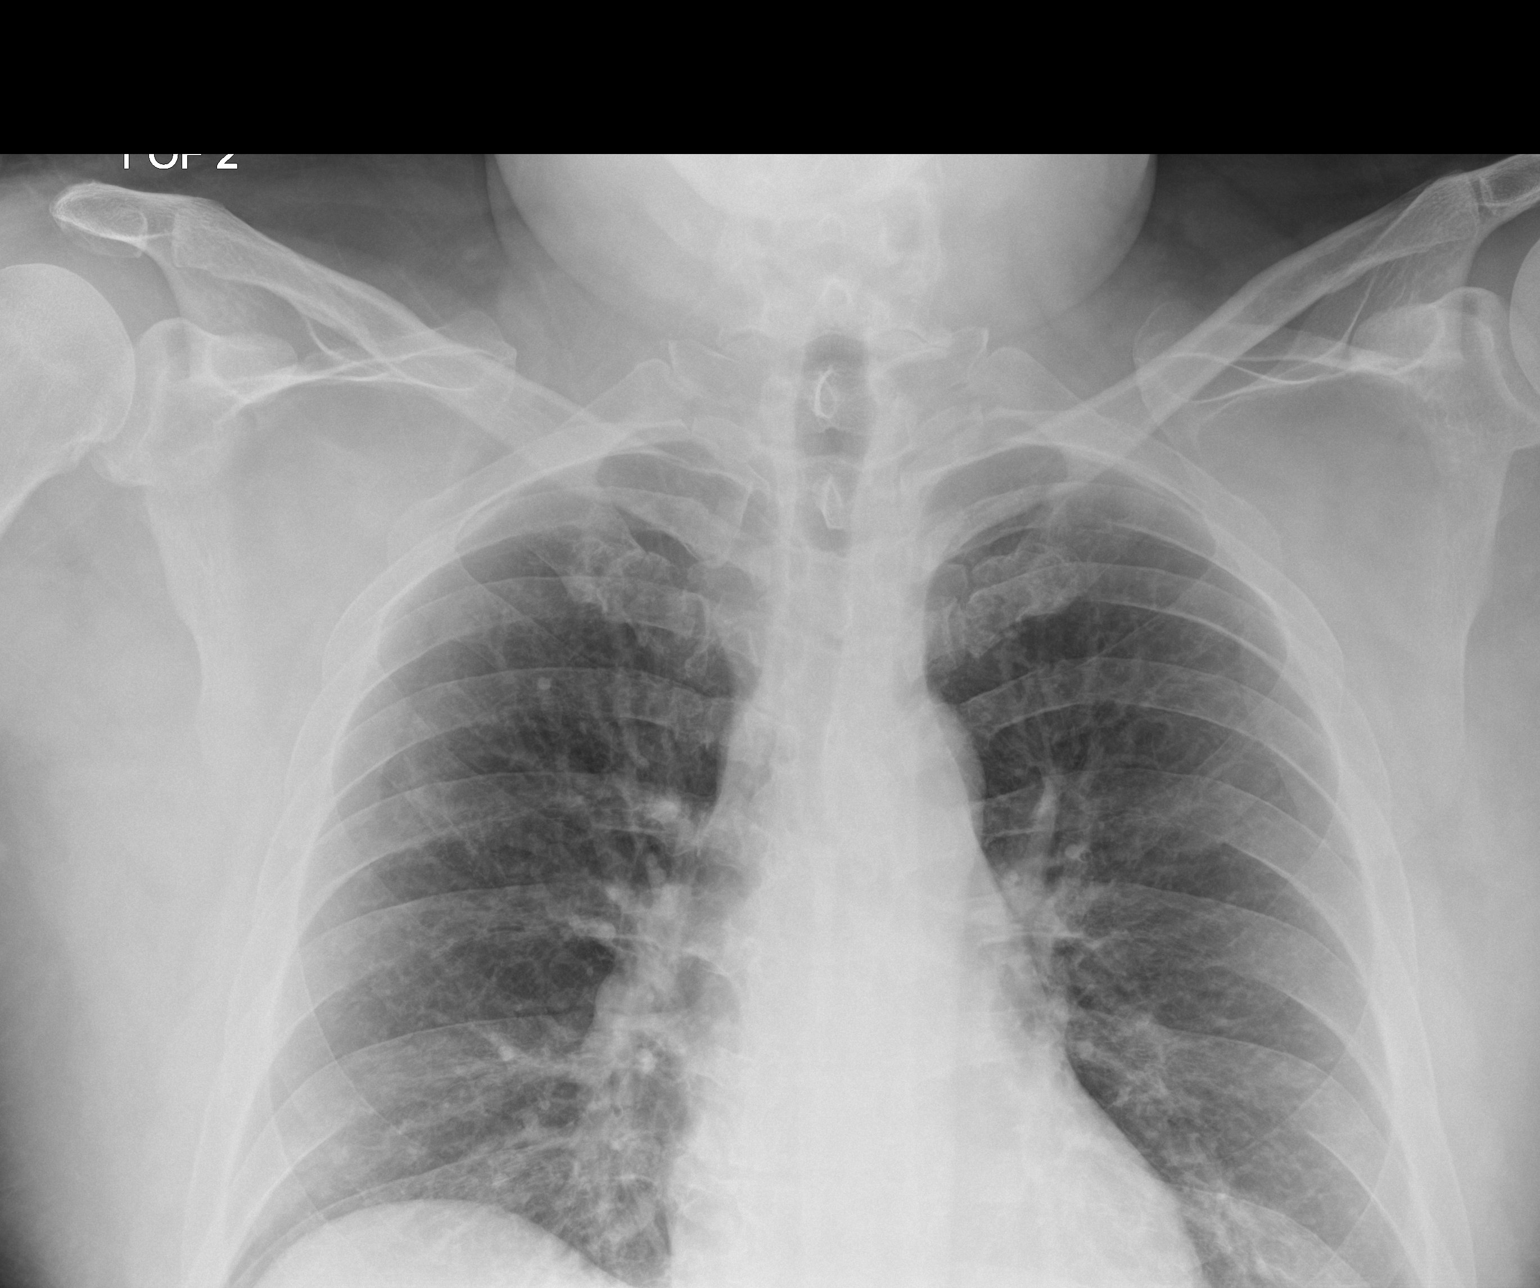

[2 of 2 positions shown; findings below may reference images not displayed]

FINDINGS: The heart size and mediastinal contours are within normal limits.
There are a few linear opacities at the left base likely scarring or
atelectasis. No new focal infiltrate. No pneumothorax or large
pleural effusion. The visualized skeletal structures are
unremarkable.
IMPRESSION: No evidence of active disease in the chest.

## 2022-10-30 IMAGING — DX DG SHOULDER 2+V*R*
3 series · 3 of 3 positions shown · non-contrast
Comparison: None.

CLINICAL DATA: Fall, right shoulder pain

EXAM:
RIGHT SHOULDER - 2+ VIEW

[shoulder axial]
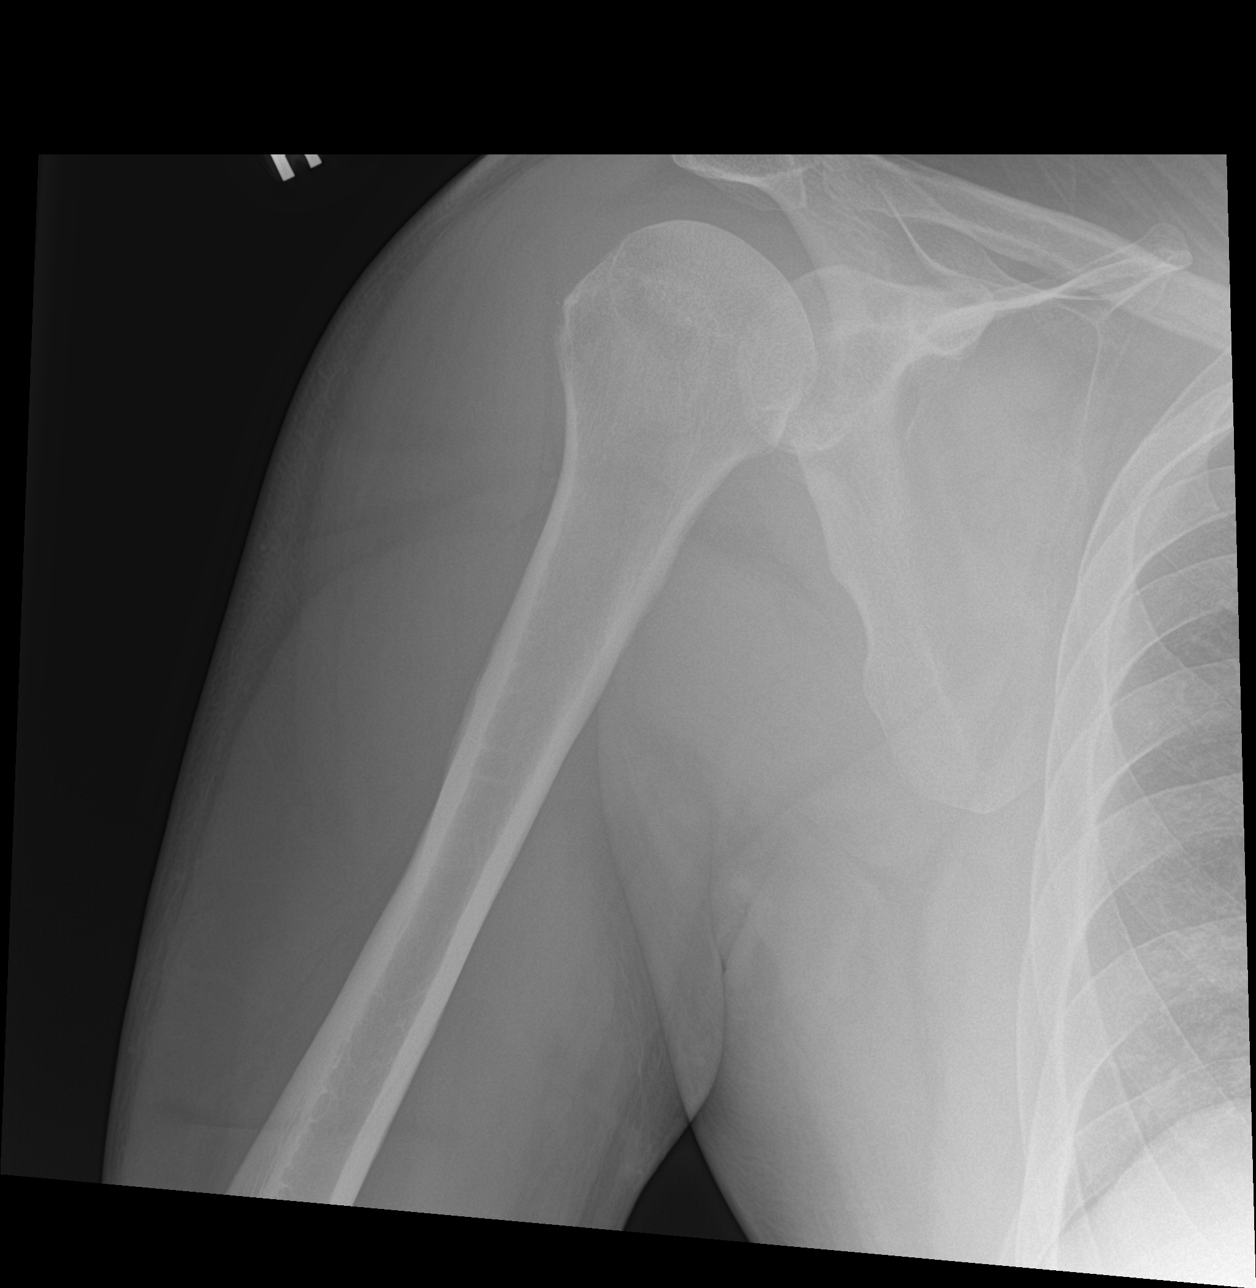

[shoulder ap]
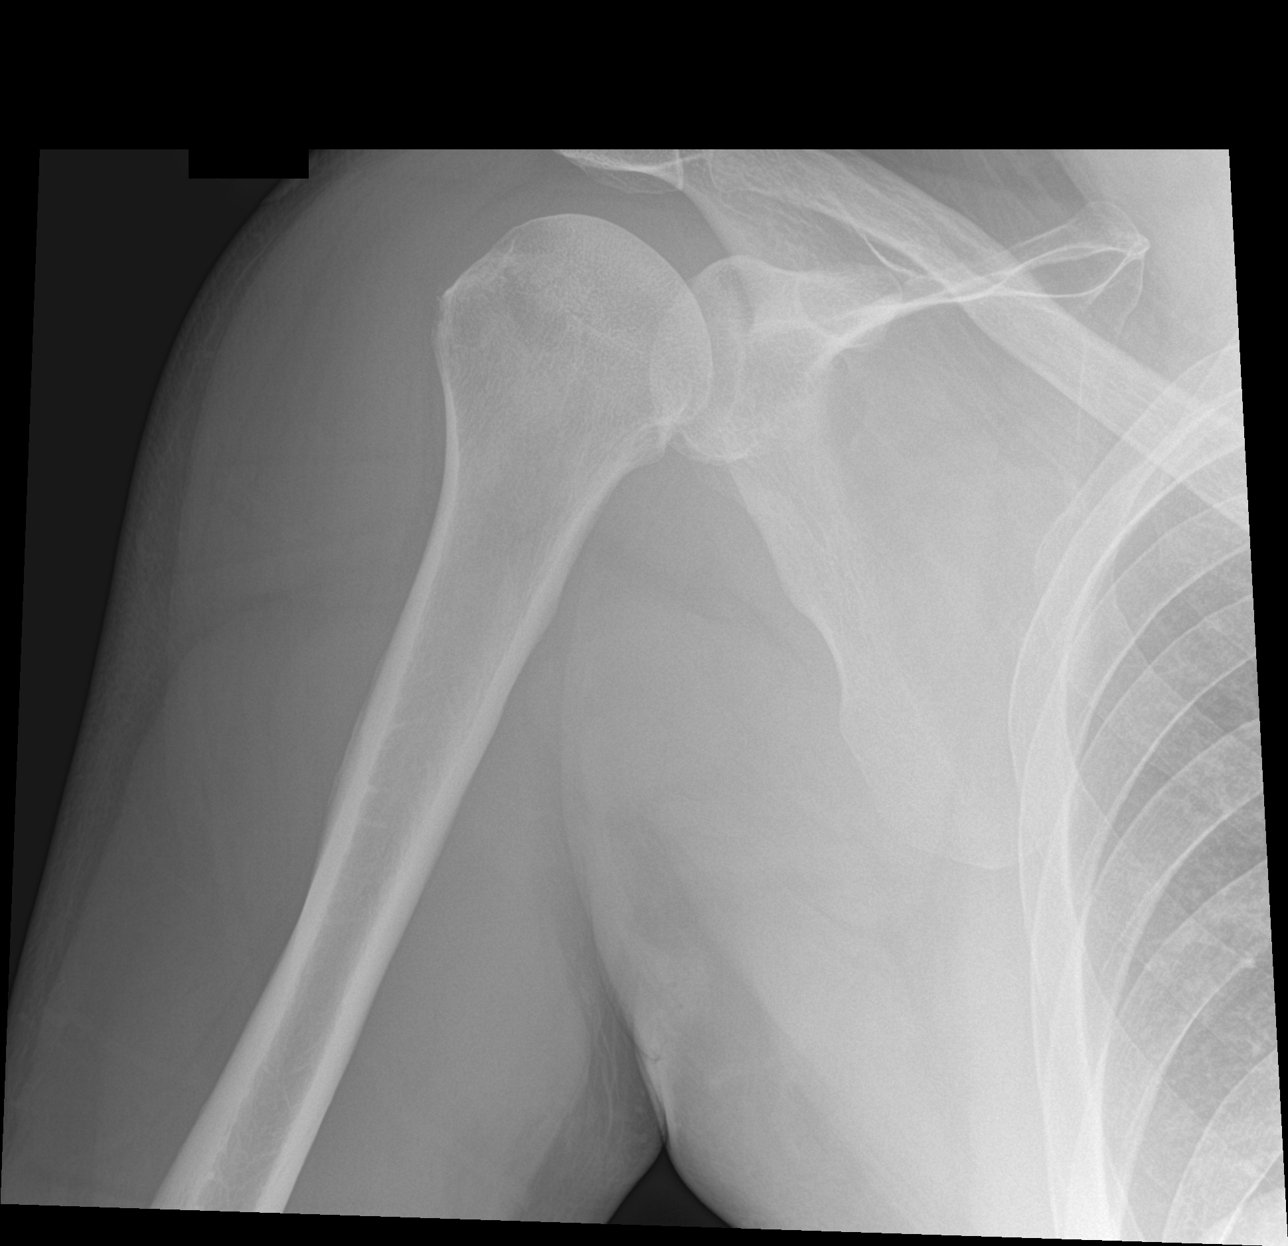

[shoulder obl]
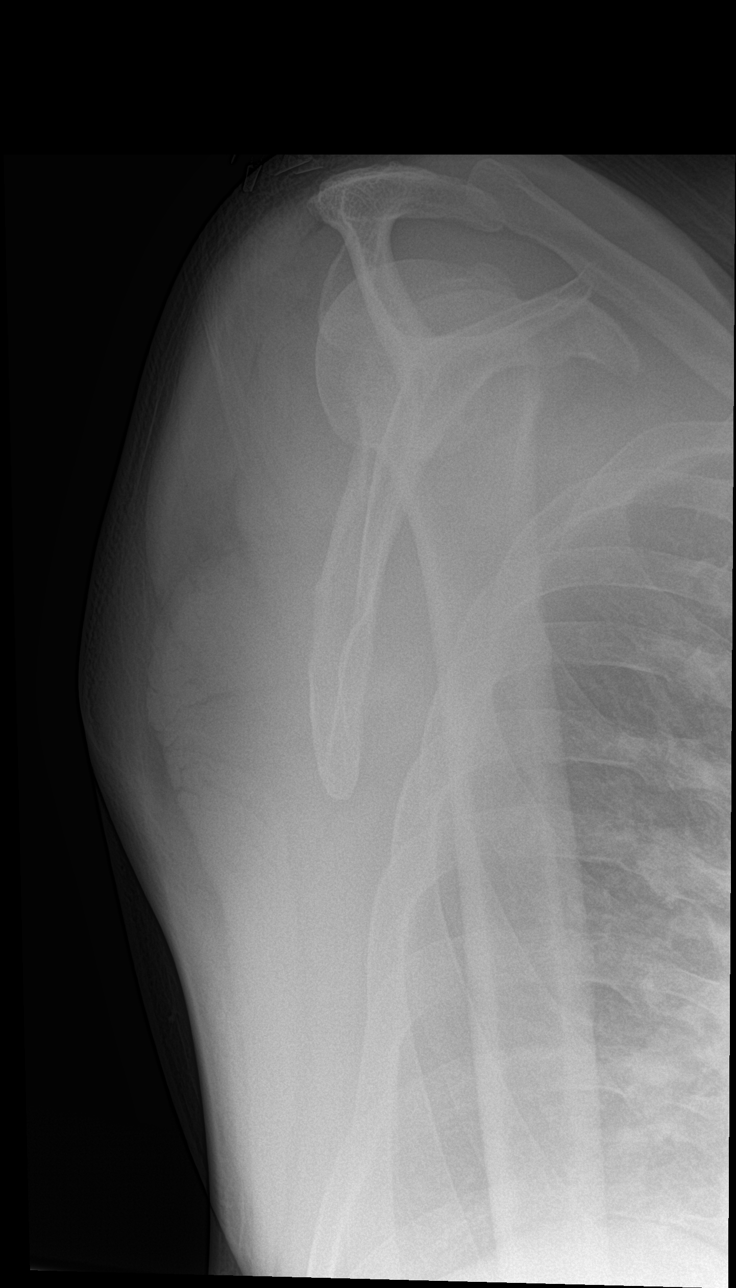

[3 of 3 positions shown; findings below may reference images not displayed]

FINDINGS: There is no evidence of fracture or dislocation. There is no
evidence of arthropathy or other focal bone abnormality. Soft
tissues are unremarkable.
IMPRESSION: Negative.

## 2022-12-16 ENCOUNTER — Emergency Department (HOSPITAL_COMMUNITY)
Admission: EM | Admit: 2022-12-16 | Discharge: 2022-12-17 | Disposition: A | Discharge status: Home / Self Care | Payer: Medicare (Managed Care) | Attending: Emergency Medicine | Admitting: Emergency Medicine

## 2022-12-16 DIAGNOSIS — F1410 Cocaine abuse, uncomplicated: Secondary | ICD-10-CM | POA: Insufficient documentation

## 2022-12-16 DIAGNOSIS — Z79899 Other long term (current) drug therapy: Secondary | ICD-10-CM | POA: Diagnosis not present

## 2022-12-16 DIAGNOSIS — F250 Schizoaffective disorder, bipolar type: Secondary | ICD-10-CM | POA: Insufficient documentation

## 2022-12-16 DIAGNOSIS — M6282 Rhabdomyolysis: Secondary | ICD-10-CM | POA: Insufficient documentation

## 2022-12-16 DIAGNOSIS — R000 Tachycardia, unspecified: Secondary | ICD-10-CM | POA: Insufficient documentation

## 2022-12-16 DIAGNOSIS — F15920 Other stimulant use, unspecified with intoxication, uncomplicated: Secondary | ICD-10-CM | POA: Insufficient documentation

## 2022-12-16 DIAGNOSIS — R441 Visual hallucinations: Secondary | ICD-10-CM | POA: Insufficient documentation

## 2022-12-16 DIAGNOSIS — F15929 Other stimulant use, unspecified with intoxication, unspecified: Secondary | ICD-10-CM

## 2022-12-16 DIAGNOSIS — F1590 Other stimulant use, unspecified, uncomplicated: Secondary | ICD-10-CM | POA: Diagnosis present

## 2022-12-16 DIAGNOSIS — R079 Chest pain, unspecified: Secondary | ICD-10-CM | POA: Insufficient documentation

## 2022-12-16 LAB — CBC WITH DIFFERENTIAL/PLATELET
Abs Immature Granulocytes: 0.04 10*3/uL (ref 0.00–0.07)
Basophils Absolute: 0 10*3/uL (ref 0.0–0.1)
Basophils Relative: 0 %
Eosinophils Absolute: 0 10*3/uL (ref 0.0–0.5)
Eosinophils Relative: 0 %
HCT: 38.3 % — ABNORMAL LOW (ref 39.0–52.0)
Hemoglobin: 12.2 g/dL — ABNORMAL LOW (ref 13.0–17.0)
Immature Granulocytes: 0 %
Lymphocytes Relative: 9 %
Lymphs Abs: 0.9 10*3/uL (ref 0.7–4.0)
MCH: 29.8 pg (ref 26.0–34.0)
MCHC: 31.9 g/dL (ref 30.0–36.0)
MCV: 93.4 fL (ref 80.0–100.0)
Monocytes Absolute: 0.8 10*3/uL (ref 0.1–1.0)
Monocytes Relative: 8 %
Neutro Abs: 8.6 10*3/uL — ABNORMAL HIGH (ref 1.7–7.7)
Neutrophils Relative %: 83 %
Platelets: 193 10*3/uL (ref 150–400)
RBC: 4.1 MIL/uL — ABNORMAL LOW (ref 4.22–5.81)
RDW: 13.7 % (ref 11.5–15.5)
WBC: 10.4 10*3/uL (ref 4.0–10.5)
nRBC: 0 % (ref 0.0–0.2)

## 2022-12-16 LAB — RAPID URINE DRUG SCREEN, HOSP PERFORMED
Amphetamines: POSITIVE — AB
Barbiturates: NOT DETECTED
Benzodiazepines: NOT DETECTED
Cocaine: NOT DETECTED
Opiates: NOT DETECTED
Tetrahydrocannabinol: NOT DETECTED

## 2022-12-16 MED ORDER — SODIUM CHLORIDE 0.9 % IV BOLUS
1000.0000 mL | Freq: Once | INTRAVENOUS | Status: AC
  Administered 2022-12-16: 1000 mL via INTRAVENOUS

## 2022-12-16 MED ORDER — LORAZEPAM 2 MG/ML IJ SOLN
2.0000 mg | Freq: Once | INTRAMUSCULAR | Status: AC
  Administered 2022-12-16: 2 mg via INTRAVENOUS
  Filled 2022-12-16: qty 1

## 2022-12-16 NOTE — ED Provider Notes (Signed)
[facility] Provider Note   CSN: [id number] Arrival date & time: 12/16/22  2157     History {Add pertinent medical, surgical, social history, OB history to HPI:1} Chief Complaint  Patient presents with   Drug Problem    Pt took meth and is having tachycardia and paranoia.     GUY HENRICH is a 47 y.o. male.  47 year old male presents to the emergency department for evaluation of hallucinations and paranoia.  He was recently released from jail and decided to use IV "ice" tonight.  States that someone was trying to calm and find him to kill him.  States he was having some chest pain earlier which is improving.  He is not currently short of breath.  Otherwise difficult historian given paranoia and need for constant redirection.  The history is provided by the patient. No language interpreter was used.  Drug Problem       Home Medications Prior to Admission medications   Medication Sig Start Date End Date Taking? Authorizing Provider  ARIPiprazole (ABILIFY) 10 MG tablet Take 1 tablet (10 mg total) by mouth daily. Patient not taking: Reported on 08/24/2021 05/10/21   Salley Scarlet, MD  ARIPiprazole ER (ABILIFY MAINTENA) 400 MG SRER injection Inject 2 mLs (400 mg total) into the muscle every 28 (twenty-eight) days. Patient not taking: Reported on 08/24/2021 06/02/21   Salley Scarlet, MD  divalproex (DEPAKOTE ER) 500 MG 24 hr tablet Take 2 tablets (1,000 mg total) by mouth at bedtime. Patient not taking: Reported on 08/24/2021 05/09/21   Salley Scarlet, MD  hydrOXYzine (ATARAX/VISTARIL) 25 MG tablet Take 1 tablet (25 mg total) by mouth 3 (three) times daily as needed for anxiety. Patient not taking: Reported on 08/24/2021 05/09/21   Salley Scarlet, MD  mirtazapine (REMERON) 15 MG tablet Take 1 tablet (15 mg total) by mouth at bedtime. For depression/sleep Patient not taking: Reported on 08/24/2021 05/09/21   Salley Scarlet, MD   traZODone (DESYREL) 100 MG tablet Take 1 tablet (100 mg total) by mouth at bedtime as needed for sleep. Patient not taking: Reported on 08/24/2021 05/09/21   Salley Scarlet, MD  fluticasone [facility]) 50 MCG/ACT nasal spray Place 1 spray into both nostrils daily. Patient not taking: Reported on 01/23/2021 08/24/20 01/23/21  Petrucelli, Aldona Bar R, [name]      Allergies    Carrot [daucus carota], Penicillins, and Ampicillin    Review of Systems   Review of Systems  Unable to perform ROS: Acuity of condition    Physical Exam Updated Vital Signs BP (!) 152/111 (BP Location: Right Arm)   Pulse (!) 146   Resp (!) 24   Ht '5\' 10"'$  (1.778 m)   Wt 106.6 kg   SpO2 100%   BMI 33.72 kg/m  Physical Exam Vitals and nursing note reviewed.  Constitutional:      General: He is not in acute distress.    Appearance: He is well-developed. He is not diaphoretic.     Comments: Nontoxic appearing, agitated. Constantly looking over shoulder and very excitable by surrounding sounds. Difficult to redirect.  HENT:     Head: Normocephalic and atraumatic.  Eyes:     General: No scleral icterus.    Conjunctiva/sclera: Conjunctivae normal.  Cardiovascular:     Rate and Rhythm: Regular rhythm. Tachycardia present.     Pulses: Normal pulses.  Pulmonary:     Effort: Pulmonary effort is normal. No respiratory distress.  Comments: Respirations even and unlabored Musculoskeletal:        General: Normal range of motion.     Cervical back: Normal range of motion.  Skin:    General: Skin is warm and dry.     Coloration: Skin is not pale.     Findings: No erythema or rash.  Neurological:     Mental Status: He is alert and oriented to person, place, and time.  Psychiatric:        Attention and Perception: He perceives visual hallucinations.        Mood and Affect: Mood is anxious.        Speech: Speech is rapid and pressured.        Behavior: Behavior is uncooperative, agitated and hyperactive.         Judgment: Judgment is impulsive.     Comments: Patient is visibly agitated and actively hallucinating.  States that people have found him and are trying to kill him.     ED Results / Procedures / Treatments   Labs (all labs ordered are listed, but only abnormal results are displayed) Labs Reviewed  RAPID URINE DRUG SCREEN, HOSP PERFORMED  CK  CBC WITH DIFFERENTIAL/PLATELET  COMPREHENSIVE METABOLIC PANEL  TROPONIN I (HIGH SENSITIVITY)    EKG None  Radiology No results found.  Procedures Procedures  {Document cardiac monitor, telemetry assessment procedure when appropriate:1}  Medications Ordered in ED Medications  LORazepam (ATIVAN) injection 2 mg (has no administration in time range)  sodium chloride 0.9 % bolus 1,000 mL (has no administration in time range)    ED Course/ Medical Decision Making/ A&P   {   Click here for ABCD2, HEART and other calculatorsREFRESH Note before signing :1}                          Medical Decision Making Amount and/or Complexity of Data Reviewed Labs: ordered. ECG/medicine tests: ordered.  Risk Prescription drug management.   ***  {Document critical care time when appropriate:1} {Document review of labs and clinical decision tools ie heart score, Chads2Vasc2 etc:1}  {Document your independent review of radiology images, and any outside records:1} {Document your discussion with family members, caretakers, and with consultants:1} {Document social determinants of health affecting pt's care:1} {Document your decision making why or why not admission, treatments were needed:1} Final Clinical Impression(s) / ED Diagnoses Final diagnoses:  None    Rx / DC Orders ED Discharge Orders     None

## 2022-12-16 NOTE — ED Triage Notes (Signed)
PT was in jail recently and has been off of drugs. He was recently released, and decided to partake in meth. He is now hallucinating, having paranoid thoughts, and thinks someone is coming to get him. He says he feels like his heart is racing. EMS says he is tachycardic. Pt said this was "bad meth" and "I don't know what that shot was"

## 2022-12-17 ENCOUNTER — Ambulatory Visit (INDEPENDENT_AMBULATORY_CARE_PROVIDER_SITE_OTHER)
Admission: EM | Admit: 2022-12-17 | Discharge: 2022-12-17 | Disposition: A | Discharge status: Home / Self Care | Payer: Medicare (Managed Care) | Source: Home / Self Care

## 2022-12-17 ENCOUNTER — Other Ambulatory Visit: Payer: Self-pay

## 2022-12-17 ENCOUNTER — Encounter (HOSPITAL_COMMUNITY): Payer: Self-pay

## 2022-12-17 DIAGNOSIS — F15959 Other stimulant use, unspecified with stimulant-induced psychotic disorder, unspecified: Secondary | ICD-10-CM

## 2022-12-17 DIAGNOSIS — F250 Schizoaffective disorder, bipolar type: Secondary | ICD-10-CM | POA: Insufficient documentation

## 2022-12-17 DIAGNOSIS — F1410 Cocaine abuse, uncomplicated: Secondary | ICD-10-CM | POA: Insufficient documentation

## 2022-12-17 DIAGNOSIS — F15920 Other stimulant use, unspecified with intoxication, uncomplicated: Secondary | ICD-10-CM | POA: Diagnosis not present

## 2022-12-17 LAB — COMPREHENSIVE METABOLIC PANEL
ALT: 44 U/L (ref 0–44)
AST: 76 U/L — ABNORMAL HIGH (ref 15–41)
Albumin: 4.3 g/dL (ref 3.5–5.0)
Alkaline Phosphatase: 59 U/L (ref 38–126)
Anion gap: 12 (ref 5–15)
BUN: 23 mg/dL — ABNORMAL HIGH (ref 6–20)
CO2: 20 mmol/L — ABNORMAL LOW (ref 22–32)
Calcium: 9 mg/dL (ref 8.9–10.3)
Chloride: 107 mmol/L (ref 98–111)
Creatinine, Ser: 1.09 mg/dL (ref 0.61–1.24)
GFR, Estimated: >60 mL/min (ref >60)
Glucose, Bld: 105 mg/dL — ABNORMAL HIGH (ref 70–99)
Potassium: 3.5 mmol/L (ref 3.5–5.1)
Sodium: 139 mmol/L (ref 135–145)
Total Bilirubin: 0.9 mg/dL (ref 0.3–1.2)
Total Protein: 7.7 g/dL (ref 6.5–8.1)

## 2022-12-17 LAB — CK
Total CK: 2451 U/L — ABNORMAL HIGH (ref 49–397)
Total CK: 2636 U/L — ABNORMAL HIGH (ref 49–397)
Total CK: 2787 U/L — ABNORMAL HIGH (ref 49–397)

## 2022-12-17 LAB — TROPONIN I (HIGH SENSITIVITY)
Troponin I (High Sensitivity): 15 ng/L (ref <18)
Troponin I (High Sensitivity): 16 ng/L (ref <18)

## 2022-12-17 MED ORDER — SODIUM CHLORIDE 0.9 % IV BOLUS
1000.0000 mL | Freq: Once | INTRAVENOUS | Status: AC
  Administered 2022-12-17: 1000 mL via INTRAVENOUS

## 2022-12-17 NOTE — Progress Notes (Signed)
Pt. was brought in voluntarily by [employer] after being seen in the ED for a panic attack. Pt. reports that he was just released from jail on Wednesday and decided to do drugs on last night. He said that he drank about 1/2 of a fifth of crown royal. Patient denies SI/HI. He also denies AVH but stated that he thought someone was foolwoing him in [place]. Patient was causu lly dressed and cooperative.    12/17/22 1444  [place] (Walk-ins at [facility] only)  How Did You Hear About [place]? Legal System  What Is the Reason for Your Visit/Call Today? Pt. was brought in voluntarily by [employer] after being seen in the ED for a panic attack.  Pt. reports that he was just releaseefr om jail on Wednesdat and decided to do drugs on last night.  He said that he drank about 1/2 of a fifth of crown royal.  Patient denies SI/HI.  He also denies AVH but stated that he thought comeone was foolwoing him in [place].  Paiten was causu lly dressed and cooperative.  How Long Has This Been Causing You Problems? <Week  Have You Recently Had Any Thoughts About Hurting Yourself? No  Are You Planning to Commit Suicide/Harm Yourself At This time? No  Have you Recently Had Thoughts About [place]? No  Are You Planning To Harm Someone At This Time? No  Are you currently experiencing any auditory, visual or other hallucinations? No  Have You Used Any Alcohol or Drugs in the Past 24 Hours? Yes  How long ago did you use Drugs or Alcohol? last night  What Did You Use and How Much? alchohol 1/ of fafifhte of crown royal and 2 shots of meth IV  Do you have any current medical co-morbidities that require immediate attention? No  Clinician description of patient physical appearance/behavior: acauslly dressed and alert  What Do You Feel Would Help You the Most Today? Alcohol or Drug Use Treatment;Treatment for Depression or other mood problem  If access to [facility] was not available, would you have sought care in the  Emergency Department? Yes  Determination of Need Routine (7 days)  Options For Referral Medication Management;Outpatient Therapy

## 2022-12-17 NOTE — ED Notes (Signed)
Patient A&O x 4, ambulatory. Patient discharged in no acute distress. Patient denied SI/HI, A/VH upon discharge. Patient verbalized understanding of all discharge instructions explained by staff, to include follow up appointments, RX's and safety plan. Patient reported mood 10/10.  Pt belongings returned to patient from blue locker  intact. Patient escorted to lobby via staff for transport to destination. Safety maintained.

## 2022-12-17 NOTE — ED Notes (Signed)
Provider talked to be about plan, I was inserting new IV as he pulled his out. He is to get his bolus then recheck his CK. Vitals are stable. Pt is sleeping/speaking nonsensical. He is cooperative and can follow commands.

## 2022-12-17 NOTE — Discharge Instructions (Addendum)
Discharge recommendations:   Outpatient Follow up: Please follow up with [facility] for ACT services and medication management.   Therapy: We recommend that patient participate in individual therapy to address mental health concerns.  Substance use: The patient should abstain from use of illicit substances/drugs and abuse of any medications. Please follow up with [facility] if interested in residential treatment. Please see resource guide for outpatient and residential substance abuse treatment options.  Atypical antipsychotics: If you are prescribed an atypical antipsychotic, it is recommended that your height, weight, BMI, blood pressure, fasting lipid panel, and fasting blood sugar be monitored by your outpatient providers.  Safety:   The following safety precautions should be taken:   No sharp objects. This includes scissors, razors, scrapers, and putty knives.   Chemicals should be removed and locked up.   Medications should be removed and locked up.   Weapons should be removed and locked up. This includes firearms, knives and instruments that can be used to cause injury.   If symptoms worsen or do not continue to improve or if the patient becomes actively suicidal or homicidal then it is recommended that the patient return to the closest hospital emergency department, the [facility], or call 911 for further evaluation and treatment. National Suicide Prevention Lifeline 1-800-SUICIDE or 863 244 2399.  About 988 988 offers 24/7 access to trained crisis counselors who can help people experiencing mental health-related distress. People can call or text 988 or chat 988lifeline.org for themselves or if they are worried about a loved one who may need crisis support.
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
## 2022-12-17 NOTE — ED Notes (Signed)
Pt up to bathroom. Assisted there. Only complain it "man I'm tired". Informed him that was likely the medicine he got to help with the anxiety.

## 2022-12-17 NOTE — ED Notes (Signed)
Pt sleeping.  No distress

## 2022-12-19 ENCOUNTER — Other Ambulatory Visit: Payer: Self-pay

## 2022-12-19 ENCOUNTER — Emergency Department (HOSPITAL_COMMUNITY): Payer: Medicare (Managed Care)

## 2022-12-19 ENCOUNTER — Encounter (HOSPITAL_COMMUNITY): Payer: Self-pay

## 2022-12-19 ENCOUNTER — Observation Stay (HOSPITAL_COMMUNITY)
Admission: EM | Admit: 2022-12-19 | Discharge: 2022-12-20 | Disposition: A | Discharge status: Home / Self Care | Payer: Medicare (Managed Care) | Attending: Family Medicine | Admitting: Family Medicine

## 2022-12-19 ENCOUNTER — Emergency Department (HOSPITAL_COMMUNITY)
Admission: EM | Admit: 2022-12-19 | Discharge: 2022-12-19 | Disposition: A | Discharge status: Home / Self Care | Payer: Medicare (Managed Care) | Source: Home / Self Care | Attending: Emergency Medicine | Admitting: Emergency Medicine

## 2022-12-19 DIAGNOSIS — L03114 Cellulitis of left upper limb: Secondary | ICD-10-CM

## 2022-12-19 DIAGNOSIS — Y900 Blood alcohol level of less than 20 mg/100 ml: Secondary | ICD-10-CM | POA: Insufficient documentation

## 2022-12-19 DIAGNOSIS — Z20822 Contact with and (suspected) exposure to covid-19: Secondary | ICD-10-CM | POA: Insufficient documentation

## 2022-12-19 DIAGNOSIS — R112 Nausea with vomiting, unspecified: Secondary | ICD-10-CM | POA: Insufficient documentation

## 2022-12-19 DIAGNOSIS — F17200 Nicotine dependence, unspecified, uncomplicated: Secondary | ICD-10-CM | POA: Diagnosis not present

## 2022-12-19 DIAGNOSIS — F1410 Cocaine abuse, uncomplicated: Secondary | ICD-10-CM | POA: Insufficient documentation

## 2022-12-19 DIAGNOSIS — F314 Bipolar disorder, current episode depressed, severe, without psychotic features: Secondary | ICD-10-CM | POA: Diagnosis present

## 2022-12-19 DIAGNOSIS — R61 Generalized hyperhidrosis: Secondary | ICD-10-CM | POA: Diagnosis not present

## 2022-12-19 DIAGNOSIS — R079 Chest pain, unspecified: Secondary | ICD-10-CM | POA: Insufficient documentation

## 2022-12-19 DIAGNOSIS — Z79899 Other long term (current) drug therapy: Secondary | ICD-10-CM | POA: Diagnosis not present

## 2022-12-19 DIAGNOSIS — Z1152 Encounter for screening for COVID-19: Secondary | ICD-10-CM | POA: Insufficient documentation

## 2022-12-19 DIAGNOSIS — R509 Fever, unspecified: Secondary | ICD-10-CM | POA: Insufficient documentation

## 2022-12-19 DIAGNOSIS — R441 Visual hallucinations: Secondary | ICD-10-CM | POA: Insufficient documentation

## 2022-12-19 DIAGNOSIS — I10 Essential (primary) hypertension: Secondary | ICD-10-CM | POA: Insufficient documentation

## 2022-12-19 DIAGNOSIS — Z8616 Personal history of COVID-19: Secondary | ICD-10-CM | POA: Insufficient documentation

## 2022-12-19 DIAGNOSIS — R000 Tachycardia, unspecified: Secondary | ICD-10-CM | POA: Insufficient documentation

## 2022-12-19 DIAGNOSIS — L0390 Cellulitis, unspecified: Secondary | ICD-10-CM | POA: Diagnosis present

## 2022-12-19 DIAGNOSIS — E1165 Type 2 diabetes mellitus with hyperglycemia: Secondary | ICD-10-CM | POA: Diagnosis not present

## 2022-12-19 DIAGNOSIS — F1510 Other stimulant abuse, uncomplicated: Secondary | ICD-10-CM | POA: Insufficient documentation

## 2022-12-19 LAB — COMPREHENSIVE METABOLIC PANEL
ALT: 49 U/L — ABNORMAL HIGH (ref 0–44)
ALT: 44 U/L (ref 0–44)
AST: 67 U/L — ABNORMAL HIGH (ref 15–41)
AST: 56 U/L — ABNORMAL HIGH (ref 15–41)
Albumin: 4.2 g/dL (ref 3.5–5.0)
Albumin: 3.8 g/dL (ref 3.5–5.0)
Alkaline Phosphatase: 63 U/L (ref 38–126)
Alkaline Phosphatase: 59 U/L (ref 38–126)
Anion gap: 13 (ref 5–15)
Anion gap: 11 (ref 5–15)
BUN: 14 mg/dL (ref 6–20)
BUN: 10 mg/dL (ref 6–20)
CO2: 21 mmol/L — ABNORMAL LOW (ref 22–32)
CO2: 24 mmol/L (ref 22–32)
Calcium: 9.2 mg/dL (ref 8.9–10.3)
Calcium: 9 mg/dL (ref 8.9–10.3)
Chloride: 103 mmol/L (ref 98–111)
Chloride: 103 mmol/L (ref 98–111)
Creatinine, Ser: 1.13 mg/dL (ref 0.61–1.24)
Creatinine, Ser: 0.87 mg/dL (ref 0.61–1.24)
GFR, Estimated: >60 mL/min (ref >60)
GFR, Estimated: >60 mL/min (ref >60)
Glucose, Bld: 122 mg/dL — ABNORMAL HIGH (ref 70–99)
Glucose, Bld: 121 mg/dL — ABNORMAL HIGH (ref 70–99)
Potassium: 3.7 mmol/L (ref 3.5–5.1)
Potassium: 3.5 mmol/L (ref 3.5–5.1)
Sodium: 137 mmol/L (ref 135–145)
Sodium: 138 mmol/L (ref 135–145)
Total Bilirubin: 0.9 mg/dL (ref 0.3–1.2)
Total Bilirubin: 1 mg/dL (ref 0.3–1.2)
Total Protein: 8 g/dL (ref 6.5–8.1)
Total Protein: 7.2 g/dL (ref 6.5–8.1)

## 2022-12-19 LAB — RAPID URINE DRUG SCREEN, HOSP PERFORMED
Amphetamines: POSITIVE — AB
Amphetamines: POSITIVE — AB
Barbiturates: NOT DETECTED
Barbiturates: NOT DETECTED
Benzodiazepines: NOT DETECTED
Benzodiazepines: NOT DETECTED
Cocaine: POSITIVE — AB
Cocaine: POSITIVE — AB
Opiates: NOT DETECTED
Opiates: NOT DETECTED
Tetrahydrocannabinol: NOT DETECTED
Tetrahydrocannabinol: NOT DETECTED

## 2022-12-19 LAB — PROTIME-INR
INR: 1 (ref 0.8–1.2)
Prothrombin Time: 13.3 seconds (ref 11.4–15.2)

## 2022-12-19 LAB — CBC
HCT: 37.6 % — ABNORMAL LOW (ref 39.0–52.0)
HCT: 35.5 % — ABNORMAL LOW (ref 39.0–52.0)
Hemoglobin: 12.7 g/dL — ABNORMAL LOW (ref 13.0–17.0)
Hemoglobin: 12 g/dL — ABNORMAL LOW (ref 13.0–17.0)
MCH: 30.1 pg (ref 26.0–34.0)
MCH: 30.6 pg (ref 26.0–34.0)
MCHC: 33.8 g/dL (ref 30.0–36.0)
MCHC: 33.8 g/dL (ref 30.0–36.0)
MCV: 89.1 fL (ref 80.0–100.0)
MCV: 90.6 fL (ref 80.0–100.0)
Platelets: 181 10*3/uL (ref 150–400)
Platelets: 156 10*3/uL (ref 150–400)
RBC: 4.22 MIL/uL (ref 4.22–5.81)
RBC: 3.92 MIL/uL — ABNORMAL LOW (ref 4.22–5.81)
RDW: 13.2 % (ref 11.5–15.5)
RDW: 13.3 % (ref 11.5–15.5)
WBC: 10.1 10*3/uL (ref 4.0–10.5)
WBC: 9.4 10*3/uL (ref 4.0–10.5)
nRBC: 0 % (ref 0.0–0.2)
nRBC: 0 % (ref 0.0–0.2)

## 2022-12-19 LAB — RESP PANEL BY RT-PCR (RSV, FLU A&B, COVID)  RVPGX2
Influenza A by PCR: NEGATIVE
Influenza B by PCR: NEGATIVE
Resp Syncytial Virus by PCR: NEGATIVE
SARS Coronavirus 2 by RT PCR: NEGATIVE

## 2022-12-19 LAB — URINALYSIS, ROUTINE W REFLEX MICROSCOPIC
Bilirubin Urine: NEGATIVE
Glucose, UA: NEGATIVE mg/dL
Hgb urine dipstick: NEGATIVE
Ketones, ur: 15 mg/dL — AB
Leukocytes,Ua: NEGATIVE
Nitrite: NEGATIVE
Protein, ur: NEGATIVE mg/dL
Specific Gravity, Urine: 1.02 (ref 1.005–1.030)
pH: 6 (ref 5.0–8.0)

## 2022-12-19 LAB — SALICYLATE LEVEL: Salicylate Lvl: <7 mg/dL — ABNORMAL LOW (ref 7.0–30.0)

## 2022-12-19 LAB — TROPONIN I (HIGH SENSITIVITY): Troponin I (High Sensitivity): 8 ng/L (ref <18)

## 2022-12-19 LAB — APTT: aPTT: 26 seconds (ref 24–36)

## 2022-12-19 LAB — ETHANOL: Alcohol, Ethyl (B): <10 mg/dL (ref <10)

## 2022-12-19 LAB — LIPASE, BLOOD: Lipase: 26 U/L (ref 11–51)

## 2022-12-19 LAB — LACTIC ACID, PLASMA: Lactic Acid, Venous: 1.1 mmol/L (ref 0.5–1.9)

## 2022-12-19 LAB — ACETAMINOPHEN LEVEL: Acetaminophen (Tylenol), Serum: <10 ug/mL — ABNORMAL LOW (ref 10–30)

## 2022-12-19 MED ORDER — ONDANSETRON 4 MG PO TBDP
4.0000 mg | ORAL_TABLET | Freq: Once | ORAL | Status: AC | PRN
  Administered 2022-12-19: 4 mg via ORAL
  Filled 2022-12-19: qty 1

## 2022-12-19 MED ORDER — VANCOMYCIN HCL IN DEXTROSE 1-5 GM/200ML-% IV SOLN
1000.0000 mg | Freq: Two times a day (BID) | INTRAVENOUS | Status: DC
  Administered 2022-12-19: 1000 mg via INTRAVENOUS
  Filled 2022-12-19: qty 200

## 2022-12-19 MED ORDER — VANCOMYCIN HCL IN DEXTROSE 1-5 GM/200ML-% IV SOLN: 1000.0000 mg | Freq: Once | INTRAVENOUS | Status: DC

## 2022-12-19 MED ORDER — DIAZEPAM 5 MG/ML IJ SOLN
2.5000 mg | Freq: Once | INTRAMUSCULAR | Status: AC
  Administered 2022-12-19: 2.5 mg via INTRAVENOUS
  Filled 2022-12-19: qty 2

## 2022-12-19 MED ORDER — TRAZODONE HCL 50 MG PO TABS: 100.0000 mg | ORAL_TABLET | Freq: Every evening | ORAL | Status: DC | PRN

## 2022-12-19 MED ORDER — ARIPIPRAZOLE 5 MG PO TABS
10.0000 mg | ORAL_TABLET | Freq: Every day | ORAL | Status: DC
  Administered 2022-12-19 – 2022-12-20 (×2): 10 mg via ORAL
  Filled 2022-12-19 (×2): qty 2

## 2022-12-19 MED ORDER — LACTATED RINGERS IV BOLUS (SEPSIS)
500.0000 mL | Freq: Once | INTRAVENOUS | Status: AC
  Administered 2022-12-19: 500 mL via INTRAVENOUS

## 2022-12-19 MED ORDER — LACTATED RINGERS IV BOLUS (SEPSIS)
1000.0000 mL | Freq: Once | INTRAVENOUS | Status: AC
  Administered 2022-12-19: 1000 mL via INTRAVENOUS

## 2022-12-19 MED ORDER — ACETAMINOPHEN 325 MG PO TABS
650.0000 mg | ORAL_TABLET | Freq: Four times a day (QID) | ORAL | Status: DC | PRN
  Administered 2022-12-19 (×2): 650 mg via ORAL
  Filled 2022-12-19 (×2): qty 2

## 2022-12-19 MED ORDER — ENOXAPARIN SODIUM 40 MG/0.4ML IJ SOSY
40.0000 mg | PREFILLED_SYRINGE | INTRAMUSCULAR | Status: DC
  Administered 2022-12-20: 40 mg via SUBCUTANEOUS
  Filled 2022-12-19: qty 0.4

## 2022-12-19 MED ORDER — VANCOMYCIN HCL IN DEXTROSE 1-5 GM/200ML-% IV SOLN
1000.0000 mg | Freq: Once | INTRAVENOUS | Status: AC
  Administered 2022-12-19: 1000 mg via INTRAVENOUS
  Filled 2022-12-19: qty 200

## 2022-12-19 MED ORDER — CLINDAMYCIN HCL 300 MG PO CAPS: 300.0000 mg | ORAL_CAPSULE | Freq: Three times a day (TID) | ORAL | 0 refills | Status: DC

## 2022-12-19 MED ORDER — MIRTAZAPINE 15 MG PO TABS
15.0000 mg | ORAL_TABLET | Freq: Every day | ORAL | Status: DC
  Administered 2022-12-19: 15 mg via ORAL
  Filled 2022-12-19: qty 1

## 2022-12-19 MED ORDER — LACTATED RINGERS IV SOLN: INTRAVENOUS | Status: DC

## 2022-12-19 MED ORDER — VANCOMYCIN HCL IN DEXTROSE 1-5 GM/200ML-% IV SOLN: 1000.0000 mg | Freq: Two times a day (BID) | INTRAVENOUS | Status: DC

## 2022-12-19 MED ORDER — DIVALPROEX SODIUM ER 500 MG PO TB24
1000.0000 mg | ORAL_TABLET | Freq: Every day | ORAL | Status: DC
  Administered 2022-12-19: 1000 mg via ORAL
  Filled 2022-12-19 (×2): qty 2

## 2022-12-19 MED ORDER — VANCOMYCIN HCL 2000 MG/400ML IV SOLN
2000.0000 mg | Freq: Once | INTRAVENOUS | Status: AC
  Administered 2022-12-19: 2000 mg via INTRAVENOUS
  Filled 2022-12-19: qty 400

## 2022-12-19 MED ORDER — METRONIDAZOLE 500 MG/100ML IV SOLN
500.0000 mg | Freq: Once | INTRAVENOUS | Status: AC
  Administered 2022-12-19: 500 mg via INTRAVENOUS
  Filled 2022-12-19: qty 100

## 2022-12-19 MED ORDER — DOXYCYCLINE HYCLATE 100 MG PO CAPS: 100.0000 mg | ORAL_CAPSULE | Freq: Two times a day (BID) | ORAL | 0 refills | Status: DC

## 2022-12-19 NOTE — Sepsis Progress Note (Signed)
Following for sepsis monitoring ?

## 2022-12-19 NOTE — Assessment & Plan Note (Addendum)
Patient currently off of all of his medications due to running out since leaving jail last Wednesday.  Actively having hallucinations, he tells me that he sees people following him and that they are in the ER watching him.  Difficult to parse what may be mental illness versus acute intoxication, though he tells me he has not used psychoactive substances in the past 2 days.  He tells me he last injected methamphetamines into his left arm 2 days ago.  However, note that UDS is positive for both cocaine and amphetamines. -Will restart patient's Abilify, Depakote, mirtazapine, trazodone -Consult to psychiatry -Consult to [facility]

## 2022-12-19 NOTE — ED Triage Notes (Signed)
Pt arrived from [facility] via [facility] pt is out of all psych meds would like to have his meds refilled. Per pt used meth x2 days ago. Left arm noted to be red and swollen in [facility] area.

## 2022-12-19 NOTE — H&P (Addendum)
Hospital Admission History and Physical Service Pager: [phone]  Patient name: Cory Floyd Medical record number: [id number] Date of Birth: 07/28/1976 Age: 47 y.o. Gender: male  Primary Care Provider: Pcp, No Consultants: None Code Status: Full -- patient not of sound mind to engage in conversation, says "let me die, they're all dead" Will re-evaluate when mental status improves  Preferred Emergency Contact: "Barbette Reichmann" [phone]  Chief Complaint: "Sweating and throwing up"  Assessment and Plan: Cory Floyd is a 47 y.o. male presenting with L arm cellulitis 2/2 IVDU.    * Cellulitis Patient was seen in the ED overnight and recommended admission but he elected to go home. Returns today after vomiting/diaphoresis at home.  Afebrile and without a white count here today. Per review of ED notes, area of involved erythema has expanded beyond previous margins.  - Admit to med-surg - Vancomycin monotherapy as has anaphylaxis to cephalosporins - Blood cultures pending - Acetaminophen PRN for fever/pain - Zofran x1 for nausea  Bipolar disorder with severe depression [facility]) Patient currently off of all of his medications due to running out since leaving jail last Wednesday.  Actively having hallucinations, he tells me that he sees people following him and that they are in the ER watching him.  Difficult to parse what may be mental illness versus acute intoxication, though he tells me he has not used psychoactive substances in the past 2 days.  He tells me he last injected methamphetamines into his left arm 2 days ago.  However, note that UDS is positive for both cocaine and amphetamines. -Will restart patient's Abilify, Depakote, mirtazapine, trazodone -Consult to psychiatry -Consult to [facility]        FEN/GI: Regular VTE Prophylaxis: Lovenox  Disposition: Med-Surg  History of Present Illness:  Cory Floyd is a 47 y.o. male presenting with L arm cellulitis 2/2 IVDU.    Symptoms of left arm erythema and swelling x 2 days.  He reports injecting meth into that arm just before this event.  Patient notes he also has apparent involvement in the right arm as well.  Was seen in the ED earlier this morning and was recommended admission but decided to go home and try to rent an apartment so he could have an address for his parole officer.  However he decided to come back to the ED after he became acutely diaphoretic and nauseated.  This scared him so bad that he elected to return to the ED and is requesting admission at this time.  Of note, patient has a history of significant psychiatric disease and is out of all of his medications at this time.  He was recently in jail, just got out last Wednesday and was only given 3 days of his medications and so has been out since last Friday or Saturday.  He has had progressive anxiety and paranoia as along with auditory and visual hallucinations.  He is most bothered by the visual hallucinations of seeing people who seem to be following him.  He tells me that he can see them in the ED with him today.  When asked about  suicidal ideation, he tells me he feels he has no one left to live for since his mom died and he is depressed but has no suicidal intent or plan.   In the ED, patient was started on vancomycin and FMTS was called for admission.  Review Of Systems: Per HPI with the following additions:  Review of Systems  Constitutional:  Positive for chills, diaphoresis and fever.  Gastrointestinal:  Positive for nausea and vomiting.  Psychiatric/Behavioral:  Positive for depression, hallucinations and substance abuse. Suicidal ideas: desires death, no plan or intent.The patient is nervous/anxious.      Pertinent Past Medical History: Bipolar Disorder Polysubstance abuse Remainder reviewed in history tab.   Pertinent Past Surgical History Remainder reviewed in history tab.   Pertinent Social History: Tobacco use:  Yes Alcohol use: none recently Other Substance use: yes, IV meth Lives with: unhoused at present, recently out of jail  Pertinent Family History: Non contributory  Remainder reviewed in history tab.   Important Outpatient Medications: Abilify, Mirtazipine,  Remainder reviewed in medication history.   Objective: BP (!) 139/112 (BP Location: Right Arm)   Pulse (!) 105   Temp 98.6 F (37 C) (Oral)   Resp (!) 22   SpO2 97%  Exam: General: Anxious, distracted, appears to be responding to internal stimuli Eyes: EOMs intact, sclerae anicteric ENTM: MMM Neck: Supple, without LAD Cardiovascular: Tachycardic, regular, no murmur Respiratory: Comfortably tachypneic, speaking in full sentences, lungs clear throughout Gastrointestinal: Soft, non-tender, non-distended MSK: Bilateral antecubitae with erythema and warmth, no drainage or streaking. See photos Derm: Skin changes to bilateral antecubitae as above Neuro: Limited exam 2/2 mental status but moving all extremities well Psych: Appears to respond to internal stimuli as above, acutely anxious and paranoid       Labs:  CBC BMET  Recent Labs  Lab 12/19/22 0935  WBC 9.4  HGB 12.0*  HCT 35.5*  PLT 156   Recent Labs  Lab 12/19/22 0935  NA 138  K 3.5  CL 103  CO2 24  BUN 10  CREATININE 0.87  GLUCOSE 121*  CALCIUM 9.0     EKG: Sinus tachycardia without ST changes, QTc 434    Imaging Studies Performed: Chest X Ray  IMPRESSION: Low lung volumes with left basilar atelectasis.   Independently reviewed and agree with radiologist interpretation   Eppie Gibson, MD 12/19/2022, 11:51 AM PGY-2, [place] Intern pager: [phone], text pages welcome Secure chat group [place]

## 2022-12-19 NOTE — Progress Notes (Signed)
Patient unable to follow conversation. Speaking incoherently and oriented to self and place. No s/s of distress. Unable to complete admission documentation at this time.

## 2022-12-19 NOTE — ED Provider Notes (Signed)
[facility] Provider Note   CSN: [id number] Arrival date & time: 12/19/22  0041     History  Chief Complaint  Patient presents with   Medical Clearance    Cory Floyd is a 47 y.o. male.  The history is provided by the patient.  Patient with history of substance use disorder, bipolar, presents for multiple complaints.  Patient reports he was staying at the [facility] when he called EMS for a wellness check.  He reports he has been out of his psychiatric medications.  He reports he has not used methamphetamine for 2 days.  He feels anxious.  He reports his left arm hurts where he used to inject drugs. He is now reporting chest pain    Past Medical History:  Diagnosis Date   Alcoholic ([place])    clean for 3 years   Anginal pain ([place])    Arthritis    Bipolar 1 disorder ([place])    COVID-19    Depressed    Diabetes mellitus without complication ([place])    Dyspnea    Fatty liver    Hypertension    Schizophrenia ([place])     Home Medications Prior to Admission medications   Medication Sig Start Date End Date Taking? Authorizing Provider  clindamycin (CLEOCIN) 300 MG capsule Take 1 capsule (300 mg total) by mouth 3 (three) times daily. 12/19/22  Yes Ripley Fraise, MD  doxycycline (VIBRAMYCIN) 100 MG capsule Take 1 capsule (100 mg total) by mouth 2 (two) times daily. One po bid x 7 days 12/19/22  Yes Ripley Fraise, MD  ARIPiprazole (ABILIFY) 10 MG tablet Take 1 tablet (10 mg total) by mouth daily. Patient not taking: Reported on 08/24/2021 05/10/21   Salley Scarlet, MD  ARIPiprazole ER (ABILIFY MAINTENA) 400 MG SRER injection Inject 2 mLs (400 mg total) into the muscle every 28 (twenty-eight) days. Patient not taking: Reported on 08/24/2021 06/02/21   Salley Scarlet, MD  divalproex (DEPAKOTE ER) 500 MG 24 hr tablet Take 2 tablets (1,000 mg total) by mouth at bedtime. Patient not taking: Reported on 08/24/2021 05/09/21   Salley Scarlet, MD  hydrOXYzine (ATARAX/VISTARIL) 25 MG tablet Take 1 tablet (25 mg total) by mouth 3 (three) times daily as needed for anxiety. Patient not taking: Reported on 08/24/2021 05/09/21   Salley Scarlet, MD  mirtazapine (REMERON) 15 MG tablet Take 1 tablet (15 mg total) by mouth at bedtime. For depression/sleep Patient not taking: Reported on 08/24/2021 05/09/21   Salley Scarlet, MD  traZODone (DESYREL) 100 MG tablet Take 1 tablet (100 mg total) by mouth at bedtime as needed for sleep. Patient not taking: Reported on 08/24/2021 05/09/21   Salley Scarlet, MD  fluticasone [facility]) 50 MCG/ACT nasal spray Place 1 spray into both nostrils daily. Patient not taking: Reported on 01/23/2021 08/24/20 01/23/21  Petrucelli, Aldona Bar R, [name]      Allergies    Carrot [daucus carota], Penicillins, and Ampicillin    Review of Systems   Review of Systems  Constitutional:  Negative for fever.  Cardiovascular:  Positive for chest pain.  Skin:  Positive for color change.    Physical Exam Updated Vital Signs BP (!) 139/107   Pulse (!) 131   Temp 98.7 F (37.1 C) (Oral)   Resp 17   SpO2 (!) 76%  Physical Exam CONSTITUTIONAL: Disheveled and anxious HEAD: Normocephalic/atraumatic EYES: EOMI/PERRL ENMT: Mucous membranes moist NECK: supple no meningeal signs SPINE/BACK:entire  spine nontender CV: S1/S2 noted, tachycardic LUNGS: Lungs are clear to auscultation bilaterally, no apparent distress ABDOMEN: soft, nontender NEURO: Pt is awake/alert/appropriate, moves all extremitiesx4.  No facial droop.   EXTREMITIES: pulses normal/equal, full ROM Distal pulses equal and intact.  Erythema and tenderness noted to the left AC fossa.  There is no crepitus.  No obvious fluctuance.  See photo SKIN: warm, see photo PSYCH: Anxious    ED Results / Procedures / Treatments   Labs (all labs ordered are listed, but only abnormal results are displayed) Labs Reviewed  COMPREHENSIVE METABOLIC PANEL - Abnormal; Notable  for the following components:      Result Value   CO2 21 (*)    Glucose, Bld 122 (*)    AST 67 (*)    ALT 49 (*)    All other components within normal limits  SALICYLATE LEVEL - Abnormal; Notable for the following components:   Salicylate Lvl [id number] (*)    All other components within normal limits  ACETAMINOPHEN LEVEL - Abnormal; Notable for the following components:   Acetaminophen (Tylenol), Serum <10 (*)    All other components within normal limits  CBC - Abnormal; Notable for the following components:   Hemoglobin 12.7 (*)    HCT 37.6 (*)    All other components within normal limits  RAPID URINE DRUG SCREEN, HOSP PERFORMED - Abnormal; Notable for the following components:   Cocaine POSITIVE (*)    Amphetamines POSITIVE (*)    All other components within normal limits  RESP PANEL BY RT-PCR (RSV, FLU A&B, COVID)  RVPGX2  CULTURE, BLOOD (ROUTINE X 2)  CULTURE, BLOOD (ROUTINE X 2)  ETHANOL  LACTIC ACID, PLASMA  PROTIME-INR  APTT  TROPONIN I (HIGH SENSITIVITY)    EKG EKG Interpretation  Date/Time:  Tuesday December 19 2022 01:28:49 EST Ventricular Rate:  114 PR Interval:  134 QRS Duration: 93 QT Interval:  315 QTC Calculation: 434 R Axis:   -9 Text Interpretation: Sinus tachycardia Confirmed by Ripley Fraise [phone]) on 12/19/2022 1:37:22 AM  Radiology DG Chest Port 1 View  Result Date: 12/19/2022 CLINICAL DATA:  Questionable sepsis-evaluate for abnormality. EXAM: PORTABLE CHEST 1 VIEW COMPARISON:  10/24/2020 FINDINGS: Low lung volumes accentuate cardiomediastinal silhouette and pulmonary vascularity. Streaky opacities in the left base favored to represent atelectasis. No pleural effusion or pneumothorax. No acute osseous abnormality. IMPRESSION: Low lung volumes with left basilar atelectasis. Electronically Signed   By: Placido Sou M.D.   On: 12/19/2022 02:02    Procedures Procedures    Medications Ordered in ED Medications  vancomycin (VANCOCIN) IVPB 1000  mg/200 mL premix (has no administration in time range)  lactated ringers bolus 1,000 mL (0 mLs Intravenous Stopped 12/19/22 0222)    And  lactated ringers bolus 1,000 mL (0 mLs Intravenous Stopped 12/19/22 0257)    And  lactated ringers bolus 1,000 mL (0 mLs Intravenous Stopped 12/19/22 0323)    And  lactated ringers bolus 500 mL (0 mLs Intravenous Stopped 12/19/22 0349)  metroNIDAZOLE (FLAGYL) IVPB 500 mg (0 mg Intravenous Stopped 12/19/22 0250)  diazepam (VALIUM) injection 2.5 mg (2.5 mg Intravenous Given 12/19/22 0148)  vancomycin (VANCOREADY) IVPB 2000 mg/400 mL (0 mg Intravenous Stopped 12/19/22 0500)    ED Course/ Medical Decision Making/ A&P Clinical Course as of 12/19/22 0603  Tue Dec 19, 2022  0356 Patient appears improved.  He now reports he does not want to be admitted.  Reports he has too much to do.  His lactate is  negative.  Will finish up his treatment and reassess [DW]  0602 Patient is been monitored for several hours.  His vitals are improved.  He has been using his phone in no distress.  He is declining admission at this time.  The cellulitis has not increased.  There is no focal abscess.  It does not appear to be a septic joint.  Will place on dual antibiotic therapy.  He has been given resources for outpatient management. We discussed strict return precautions [DW]    Clinical Course User Index [DW] Ripley Fraise, MD                             Medical Decision Making Amount and/or Complexity of Data Reviewed Labs: ordered. Radiology: ordered.  Risk Prescription drug management. Decision regarding hospitalization.   This patient presents to the ED for concern of arm pain, this involves an extensive number of treatment options, and is a complaint that carries with it a high risk of complications and morbidity.  The differential diagnosis includes but is not limited to cellulitis, abscess, necrotizing fasciitis, muscle strain, fracture  Comorbidities that complicate  the patient evaluation: Patient's presentation is complicated by their history of bipolar  Social Determinants of Health: Patient's  substance use disorder   increases the complexity of managing their presentation  Additional history obtained: Records reviewed  behavioral health notes noted  Lab Tests: I Ordered, and personally interpreted labs.  The pertinent results include: Mild hyperglycemia  Imaging Studies ordered: I ordered imaging studies including X-ray chest   I independently visualized and interpreted imaging which showed no acute findings I agree with the radiologist interpretation  Cardiac Monitoring: The patient was maintained on a cardiac monitor.  I personally viewed and interpreted the cardiac monitor which showed an underlying rhythm of:  sinus tachycardia  Medicines ordered and prescription drug management: I ordered medication including IV fluids and antibiotics for cellulitis Reevaluation of the patient after these medicines showed that the patient    improved   Critical Interventions:   IV antibiotics   Reevaluation: After the interventions noted above, I reevaluated the patient and found that they have :improved  Complexity of problems addressed: Patient's presentation is most consistent with  acute presentation with potential threat to life or bodily function  Disposition: After consideration of the diagnostic results and the patient's response to treatment,  I feel that the patent would benefit from discharge   .    6:04 AM Patient declined admission.  Vitals improved.  He is mentating appropriately.  He does not appear intoxicated.  We discussed strict return precautions        Final Clinical Impression(s) / ED Diagnoses Final diagnoses:  Cellulitis of left upper extremity    Rx / DC Orders ED Discharge Orders          Ordered    doxycycline (VIBRAMYCIN) 100 MG capsule  2 times daily        12/19/22 0555    clindamycin (CLEOCIN)  300 MG capsule  3 times daily        12/19/22 0555              Ripley Fraise, MD 12/19/22 [phone]

## 2022-12-19 NOTE — Consult Note (Signed)
[facility] New Face-to-Face Psychiatric Evaluation   Service Date: December 19, 2022 LOS:  LOS: 0 days    Assessment  Cory Floyd is a 47 y.o. male admitted medically for 12/19/2022  8:58 AM for celluoitis. He carries the psychiatric diagnoses of bipolar disorder, multiple substance use disorders, etoh use in unspecified remission and has a past medical history of fatty liver, arthritis, hypertension, DM. Psychiatry was consulted for Auditory and visual hallucinations-- off all psych meds by Dr. Joelyn Oms.   His current presentation of rapid onset of psychotic symptoms after using methamphetamine and cocaine is most consistent with substance induced psychosis. He has a history of bipolar disorder although unclear if he has had manic or psychotic episodes outside of substance use. At the time I saw him, his priorly effective medications had been restarted by primary team; briefly discussed this with pt and he assented. He was not compliant with medications prior to admission as evidenced by pt report. On initial examination, patient was paranoid, crying, and difficult to engage in interview - feel best course of action is to re attempt tomorrow as he is not suicidal, not homicidal, not an acute danger to himself, and sleep will help with both his substance induced psychosis (preventing it from turning into full blown mania) and his mood issues. Please see plan below for detailed recommendations.   Diagnoses:  Active Hospital problems: Principal Problem:   Cellulitis Active Problems:   Bipolar disorder with severe depression ([place])     Plan  ## Safety and Observation Level:  - Based on my clinical evaluation, I estimate the patient to be at low risk of self harm in the current setting - At this time, we recommend a routine level of observation. This decision is based on my review of the chart including patient's history and current presentation, interview of the patient, mental  status examination, and consideration of suicide risk including evaluating suicidal ideation, plan, intent, suicidal or self-harm behaviors, risk factors, and protective factors. This judgment is based on our ability to directly address suicide risk, implement suicide prevention strategies and develop a safety plan while the patient is in the clinical setting. Please contact our team if there is a concern that risk level has changed.   ## Medications:  -- aripiprazole 10 mg -- depakote 1000 mg  -- mirtazapine 15 mg    ## Medical Decision Making Capacity:  Not formally assessed  ## Further Work-up:  -- none curently, need to recheck CMP in a couple days   -- has been out of jail for a few days - do not think there is utility in VPA level at this time -- most recent EKG on 2/27 had QtC of 434 -- Pertinent labwork reviewed earlier this admission includes:  - mild transaminitis, resolving   - uds + meth and cocaine (meth only 3 days ago)  - cbc w/ anemia  - etoh <20   ## Disposition:  -- unable to determine at this time  -- at minimum, would like to reconnect pt to [facility]   ##Legal Status -- currently vol  Thank you for this consult request. Recommendations have been communicated to the primary team.  We will continue to follow at this time.   Komatke A Matha Masse   New history  Relevant Aspects of Hospital Course:  Admitted on 12/19/2022 for cellulitis. Notably had a presentation to the ED on 2/24 for methamphetamine intoxication (referred to outpt behavioral health) and at  the [facility] on 2/25 accompanied by law enforcement for paranoia at which time he was discharged with a recommendation to follow up with [facility]  Patient Report:  Pt awoken from sleep; appears to have been having a nightmare. He is able to relate a brief narrative history leading to hosptialization - has been out of jail and off meds for a week. He specifically denied SI and HI - denied  AH/VH. Around this time he became overwhelmed and started crying uncontrollably about his mother's death, then fell back asleep. At this point I aborted the interview. Couldn't get full EtOH hx, but denied any access in jail (making withdrawal unlikely). Not overtly responding to internal stimuli although preoccupied by guilt.   ROS:  Difficult to obtain.   Collateral information:  None obtained - no contact in chart  Psychiatric History:  Information collected from pt, medical record  Family psych history: unknown   Social History:    Tobacco use: yes, per chart Alcohol use: in remision per chart Drug use: yes, meth and cocaine  Family History:  The patient's family history includes Cancer in his maternal aunt and mother; Hepatitis C in his mother; Hypertension in his maternal grandmother.  Medical History: Past Medical History:  Diagnosis Date   Alcoholic ([place])    clean for 3 years   Anginal pain ([place])    Arthritis    Bipolar 1 disorder ([place])    COVID-19    Depressed    Diabetes mellitus without complication ([place])    Dyspnea    Fatty liver    Hypertension    Schizophrenia ([place])     Surgical History: Past Surgical History:  Procedure Laterality Date   COLONOSCOPY  02/05/2014   diverticulosis, hyperplastic polyp x 2   fracture arm Right    KNEE SURGERY Right 2013   NASAL SEPTUM SURGERY     NASAL TURBINATE REDUCTION Bilateral 12/12/2016   Procedure: TURBINATE REDUCTION/SUBMUCOSAL RESECTION;  Surgeon: Beverly Gust, MD;  Location: [facility] ORS;  Service: ENT;  Laterality: Bilateral;    Medications:   Current Facility-Administered Medications:    acetaminophen (TYLENOL) tablet 650 mg, 650 mg, Oral, Q6H PRN, Eppie Gibson, MD, 650 mg at 12/19/22 1243   ARIPiprazole (ABILIFY) tablet 10 mg, 10 mg, Oral, Daily, Jim Like B, MD, 10 mg at 12/19/22 1243   divalproex (DEPAKOTE ER) 24 hr tablet 1,000 mg, 1,000 mg, Oral, QHS, Eppie Gibson, MD   [START ON  12/20/2022] enoxaparin (LOVENOX) injection 40 mg, 40 mg, Subcutaneous, Q24H, Jim Like B, MD   mirtazapine (REMERON) tablet 15 mg, 15 mg, Oral, QHS, Eppie Gibson, MD   traZODone (DESYREL) tablet 100 mg, 100 mg, Oral, QHS PRN, Eppie Gibson, MD   [START ON 12/20/2022] vancomycin (VANCOCIN) IVPB 1000 mg/200 mL premix, 1,000 mg, Intravenous, Q12H, Hammons, Kimberly B, RPH  Allergies: Allergies  Allergen Reactions   Carrot [Daucus Carota] Anaphylaxis and Rash   Penicillins Anaphylaxis and Hives    Has patient had a PCN reaction causing immediate rash, facial/tongue/throat swelling, SOB or lightheadedness with hypotension: Yes Has patient had a PCN reaction causing severe rash involving mucus membranes or skin necrosis: No Has patient had a PCN reaction that required hospitalization Yes Has patient had a PCN reaction occurring within the last 10 years: No If all of the above answers are "NO", then may proceed with Cephalosporin use.    Ampicillin        Objective  Vital signs:  Temp:  [  98.6 F (37 C)-99 F (37.2 C)] 98.6 F (37 C) (02/27 0905) Pulse Rate:  [97-131] 100 (02/27 1533) Resp:  [13-27] 17 (02/27 1533) BP: (111-157)/(66-112) 116/70 (02/27 1533) SpO2:  [76 %-100 %] 97 % (02/27 1533)  Psychiatric Specialty Exam:  Presentation  General Appearance:  Casual  Eye Contact: Fair  Speech: Clear and Coherent  Speech Volume: Normal  Handedness: Right   Mood and Affect  Mood: Anxious  Affect: Congruent   Thought Process  Thought Processes: Coherent  Descriptions of Associations:Intact  Orientation:Full (Time, Place and Person)  Thought Content:Logical  History of Schizophrenia/Schizoaffective disorder:No data recorded Duration of Psychotic Symptoms:No data recorded Hallucinations:No data recorded Ideas of Reference:Paranoia  Suicidal Thoughts:No data recorded Homicidal Thoughts:No data recorded  Sensorium  Memory: Immediate Fair;  Recent Fair; Remote Fair  Judgment: Intact  Insight: Present   Executive Functions  Concentration: Fair  Attention Span: Fair  Recall: [employer] of Knowledge: Fair  Language: Fair   Psychomotor Activity  Psychomotor Activity:No data recorded  Assets  Assets: Communication Skills; Desire for Improvement; Physical Health; Social Support; Leisure Time   Sleep  Sleep:No data recorded   Physical Exam: Physical Exam Constitutional:      Appearance: He is obese.  HENT:     Head: Normocephalic.  Pulmonary:     Effort: Pulmonary effort is normal.  Neurological:     Comments: Oriented to self and situation     Blood pressure 116/70, pulse 100, temperature 98.6 F (37 C), temperature source Oral, resp. rate 17, SpO2 97 %. There is no height or weight on file to calculate BMI.

## 2022-12-19 NOTE — Progress Notes (Signed)
Pharmacy Antibiotic Note  Cory Floyd is a 47 y.o. male admitted on 12/19/2022 with cellulitis.  Pharmacy has been consulted for Vancomycin dosing.  Plan: Vancomycin 2000 mg IV x 1, then 1000 mg IV q12h >>>Estimated AUC: 513 Trend WBC, temp, renal function  F/U infectious work-up Drug levels as indicated   Temp (24hrs), Avg:99 F (37.2 C), Min:99 F (37.2 C), Max:99 F (37.2 C)  Recent Labs  Lab 12/16/22 2252 12/19/22 0108 12/19/22 0130  WBC 10.4 10.1  --   CREATININE 1.09 1.13  --   LATICACIDVEN  --   --  1.1    Estimated Creatinine Clearance: 97.7 mL/min (by C-G formula based on SCr of 1.13 mg/dL).    Allergies  Allergen Reactions   Carrot [Daucus Carota] Anaphylaxis and Rash   Penicillins Anaphylaxis and Hives    Has patient had a PCN reaction causing immediate rash, facial/tongue/throat swelling, SOB or lightheadedness with hypotension: Yes Has patient had a PCN reaction causing severe rash involving mucus membranes or skin necrosis: No Has patient had a PCN reaction that required hospitalization Yes Has patient had a PCN reaction occurring within the last 10 years: No If all of the above answers are "NO", then may proceed with Cephalosporin use.    Ampicillin     Narda Bonds, PharmD, BCPS Clinical Pharmacist Phone: [phone]

## 2022-12-19 NOTE — ED Provider Notes (Signed)
[facility] Provider Note   CSN: [id number] Arrival date & time: 12/19/22  [id number]     History  Chief Complaint  Patient presents with   substance use disorder   cellulitis    Cory Floyd is a 47 y.o. male.  HPI   Patient with medical history of substance use disorder presents to the emergency department due to cellulitis.  Patient was seen in the emergency department yesterday and had left upper extremity cellulitis which has been worsening in the past 24 hours.  He was offered admission but declined.  He states he has been having worsening visual hallucinations in the last few days, also having fevers, had 1 episode of emesis prior to arrival.  Home Medications Prior to Admission medications   Medication Sig Start Date End Date Taking? Authorizing Provider  ARIPiprazole (ABILIFY) 10 MG tablet Take 1 tablet (10 mg total) by mouth daily. Patient not taking: Reported on 08/24/2021 05/10/21   Salley Scarlet, MD  ARIPiprazole ER (ABILIFY MAINTENA) 400 MG SRER injection Inject 2 mLs (400 mg total) into the muscle every 28 (twenty-eight) days. Patient not taking: Reported on 08/24/2021 06/02/21   Salley Scarlet, MD  clindamycin (CLEOCIN) 300 MG capsule Take 1 capsule (300 mg total) by mouth 3 (three) times daily. 12/19/22   Ripley Fraise, MD  divalproex (DEPAKOTE ER) 500 MG 24 hr tablet Take 2 tablets (1,000 mg total) by mouth at bedtime. Patient not taking: Reported on 08/24/2021 05/09/21   Salley Scarlet, MD  doxycycline (VIBRAMYCIN) 100 MG capsule Take 1 capsule (100 mg total) by mouth 2 (two) times daily. One po bid x 7 days 12/19/22   Ripley Fraise, MD  hydrOXYzine (ATARAX/VISTARIL) 25 MG tablet Take 1 tablet (25 mg total) by mouth 3 (three) times daily as needed for anxiety. Patient not taking: Reported on 08/24/2021 05/09/21   Salley Scarlet, MD  mirtazapine (REMERON) 15 MG tablet Take 1 tablet (15 mg total) by mouth at bedtime.  For depression/sleep Patient not taking: Reported on 08/24/2021 05/09/21   Salley Scarlet, MD  traZODone (DESYREL) 100 MG tablet Take 1 tablet (100 mg total) by mouth at bedtime as needed for sleep. Patient not taking: Reported on 08/24/2021 05/09/21   Salley Scarlet, MD  fluticasone [facility]) 50 MCG/ACT nasal spray Place 1 spray into both nostrils daily. Patient not taking: Reported on 01/23/2021 08/24/20 01/23/21  Petrucelli, Aldona Bar R, [name]      Allergies    Carrot [daucus carota], Penicillins, and Ampicillin    Review of Systems   Review of Systems  Physical Exam Updated Vital Signs BP (!) 139/112 (BP Location: Right Arm)   Pulse (!) 105   Temp 98.6 F (37 C) (Oral)   Resp (!) 22   SpO2 97%  Physical Exam Vitals and nursing note reviewed.  Constitutional:      General: He is not in acute distress.    Appearance: He is well-developed.  HENT:     Head: Normocephalic and atraumatic.  Eyes:     Conjunctiva/sclera: Conjunctivae normal.  Cardiovascular:     Rate and Rhythm: Normal rate and regular rhythm.     Heart sounds: No murmur heard. Pulmonary:     Effort: Pulmonary effort is normal. No respiratory distress.     Breath sounds: Normal breath sounds.  Abdominal:     Palpations: Abdomen is soft.     Tenderness: There is no abdominal tenderness.  Musculoskeletal:  General: No swelling.     Cervical back: Neck supple.  Skin:    General: Skin is warm and dry.     Capillary Refill: Capillary refill takes less than 2 seconds.     Findings: Erythema present.     Comments: See photo, significant cellulitis left upper extremity stemming from the antecubital area 10 cm x 8 cm  Neurological:     Mental Status: He is alert.  Psychiatric:        Mood and Affect: Mood normal.          ED Results / Procedures / Treatments   Labs (all labs ordered are listed, but only abnormal results are displayed) Labs Reviewed  COMPREHENSIVE METABOLIC PANEL - Abnormal; Notable  for the following components:      Result Value   Glucose, Bld 121 (*)    AST 56 (*)    All other components within normal limits  CBC - Abnormal; Notable for the following components:   RBC 3.92 (*)    Hemoglobin 12.0 (*)    HCT 35.5 (*)    All other components within normal limits  URINALYSIS, ROUTINE W REFLEX MICROSCOPIC - Abnormal; Notable for the following components:   Ketones, ur 15 (*)    All other components within normal limits  RAPID URINE DRUG SCREEN, HOSP PERFORMED - Abnormal; Notable for the following components:   Cocaine POSITIVE (*)    Amphetamines POSITIVE (*)    All other components within normal limits  LIPASE, BLOOD    EKG None  Radiology DG Chest Port 1 View  Result Date: 12/19/2022 CLINICAL DATA:  Questionable sepsis-evaluate for abnormality. EXAM: PORTABLE CHEST 1 VIEW COMPARISON:  10/24/2020 FINDINGS: Low lung volumes accentuate cardiomediastinal silhouette and pulmonary vascularity. Streaky opacities in the left base favored to represent atelectasis. No pleural effusion or pneumothorax. No acute osseous abnormality. IMPRESSION: Low lung volumes with left basilar atelectasis. Electronically Signed   By: Placido Sou M.D.   On: 12/19/2022 02:02    Procedures Procedures    Medications Ordered in ED Medications  ondansetron (ZOFRAN-ODT) disintegrating tablet 4 mg (has no administration in time range)  vancomycin (VANCOCIN) IVPB 1000 mg/200 mL premix (has no administration in time range)    ED Course/ Medical Decision Making/ A&P                             Medical Decision Making Amount and/or Complexity of Data Reviewed Labs: ordered.  Risk Prescription drug management. Decision regarding hospitalization.   This is a 47 year old male presented to the emergency department due to cellulitis.  He is afebrile on exam but is slightly tachypneic and tachycardic.  He is neurovascular intact with brisk cap refill, radial pulses 2+.  I do not  appreciate any fluctuance or induration, he does have an obvious cellulitis which has been worsening compared to pictures from yesterday's note.  See physical exam above for current distribution.  Laboratory workup resulted, no leukocytosis, he is positive for methamphetamine and cocaine use.  I do think patient will need admission for IV antibiotics given failure of outpatient and poor availability to follow-up.  I consulted family medicine service who agrees with admission.  Vancomycin has been ordered.        Final Clinical Impression(s) / ED Diagnoses Final diagnoses:  Cellulitis of left upper extremity    Rx / DC Orders ED Discharge Orders     None  Sherrill Raring, [name] 12/19/22 1122    Pattricia Boss, MD 12/19/22 [phone]

## 2022-12-19 NOTE — ED Notes (Signed)
ED TO INPATIENT HANDOFF REPORT  ED Nurse Name and Phone #: [phone]  S Name/Age/Gender Lendon Collar 47 y.o. male Room/Bed: H021C/H021C  Code Status   Code Status: Prior  Home/SNF/Other Home Patient oriented to: self, place, time, and situation Is this baseline? Yes   Triage Complete: Triage complete  Chief Complaint Cellulitis [L03.90]  Triage Note Pt here for second time today-was seen this morning and declined admission for cellulitis so was discharged with prescription for abx. Patient states he left to go find somewhere to live/pay his rent and became nauseas and started vomiting on the way so came back. Also having continual AVH. States last drug use two days ago.    Allergies Allergies  Allergen Reactions   Carrot [Daucus Carota] Anaphylaxis and Rash   Penicillins Anaphylaxis and Hives    Has patient had a PCN reaction causing immediate rash, facial/tongue/throat swelling, SOB or lightheadedness with hypotension: Yes Has patient had a PCN reaction causing severe rash involving mucus membranes or skin necrosis: No Has patient had a PCN reaction that required hospitalization Yes Has patient had a PCN reaction occurring within the last 10 years: No If all of the above answers are "NO", then may proceed with Cephalosporin use.    Ampicillin     Level of Care/Admitting Diagnosis ED Disposition     ED Disposition  Admit   Condition  --   Comment  Hospital Area: [facility] [[id number]]  Level of Care: Med-Surg [16]  May place patient in observation at [facility] or [facility] [facility] if equivalent level of care is available:: No  Covid Evaluation: Asymptomatic - no recent exposure (last 10 days) testing not required  Diagnosis: Cellulitis [id number]  Admitting Physician: Eppie Gibson [id number]  Attending Physician: Dickie La [[id number]]          B Medical/Surgery History Past Medical History:  Diagnosis Date   Alcoholic ([place])    clean for 3  years   Anginal pain ([place])    Arthritis    Bipolar 1 disorder ([place])    COVID-19    Depressed    Diabetes mellitus without complication ([place])    Dyspnea    Fatty liver    Hypertension    Schizophrenia ([place])    Past Surgical History:  Procedure Laterality Date   COLONOSCOPY  02/05/2014   diverticulosis, hyperplastic polyp x 2   fracture arm Right    KNEE SURGERY Right 2013   NASAL SEPTUM SURGERY     NASAL TURBINATE REDUCTION Bilateral 12/12/2016   Procedure: TURBINATE REDUCTION/SUBMUCOSAL RESECTION;  Surgeon: Beverly Gust, MD;  Location: [facility] ORS;  Service: ENT;  Laterality: Bilateral;     A IV Location/Drains/Wounds Patient Lines/Drains/Airways Status     Active Line/Drains/Airways     None            Intake/Output Last 24 hours No intake or output data in the 24 hours ending 12/19/22 1125  Labs/Imaging Results for orders placed or performed during the hospital encounter of 12/19/22 (from the past 48 hour(s))  Lipase, blood     Status: None   Collection Time: 12/19/22  9:35 AM  Result Value Ref Range   Lipase 26 11 - 51 U/L    Comment: Performed at [facility], [place] [address] [place], [place] [address]  Comprehensive metabolic panel     Status: Abnormal   Collection Time: 12/19/22  9:35 AM  Result Value Ref Range   Sodium 138 135 -  145 mmol/L   Potassium 3.5 3.5 - 5.1 mmol/L   Chloride 103 98 - 111 mmol/L   CO2 24 22 - 32 mmol/L   Glucose, Bld 121 (H) 70 - 99 mg/dL    Comment: Glucose reference range applies only to samples taken after fasting for at least 8 hours.   BUN 10 6 - 20 mg/dL   Creatinine, Ser 0.87 0.61 - 1.24 mg/dL   Calcium 9.0 8.9 - 10.3 mg/dL   Total Protein 7.2 6.5 - 8.1 g/dL   Albumin 3.8 3.5 - 5.0 g/dL   AST 56 (H) 15 - 41 U/L   ALT 44 0 - 44 U/L   Alkaline Phosphatase 59 38 - 126 U/L   Total Bilirubin 1.0 0.3 - 1.2 mg/dL   GFR, Estimated >60 >60 mL/min    Comment: (NOTE) Calculated using the CKD-EPI Creatinine Equation  (2021)    Anion gap 11 5 - 15    Comment: Performed at [facility] [address], [place], [place] [address]  CBC     Status: Abnormal   Collection Time: 12/19/22  9:35 AM  Result Value Ref Range   WBC 9.4 4.0 - 10.5 K/uL   RBC 3.92 (L) 4.22 - 5.81 MIL/uL   Hemoglobin 12.0 (L) 13.0 - 17.0 g/dL   HCT 35.5 (L) 39.0 - 52.0 %   MCV 90.6 80.0 - 100.0 fL   MCH 30.6 26.0 - 34.0 pg   MCHC 33.8 30.0 - 36.0 g/dL   RDW 13.3 11.5 - 15.5 %   Platelets 156 150 - 400 K/uL   nRBC 0.0 0.0 - 0.2 %    Comment: Performed at [facility], [place] [address] [place], [place] [address]  Urinalysis, Routine w reflex microscopic -Urine, Clean Catch     Status: Abnormal   Collection Time: 12/19/22  9:45 AM  Result Value Ref Range   Color, Urine YELLOW YELLOW   APPearance CLEAR CLEAR   Specific Gravity, Urine 1.020 1.005 - 1.030   pH 6.0 5.0 - 8.0   Glucose, UA NEGATIVE NEGATIVE mg/dL   Hgb urine dipstick NEGATIVE NEGATIVE   Bilirubin Urine NEGATIVE NEGATIVE   Ketones, ur 15 (A) NEGATIVE mg/dL   Protein, ur NEGATIVE NEGATIVE mg/dL   Nitrite NEGATIVE NEGATIVE   Leukocytes,Ua NEGATIVE NEGATIVE    Comment: Microscopic not done on urines with negative protein, blood, leukocytes, nitrite, or glucose < 500 mg/dL. Performed at [facility], [place] [address], [place], [place] [address]   Rapid urine drug screen (hospital performed)     Status: Abnormal   Collection Time: 12/19/22  9:45 AM  Result Value Ref Range   Opiates NONE DETECTED NONE DETECTED   Cocaine POSITIVE (A) NONE DETECTED   Benzodiazepines NONE DETECTED NONE DETECTED   Amphetamines POSITIVE (A) NONE DETECTED   Tetrahydrocannabinol NONE DETECTED NONE DETECTED   Barbiturates NONE DETECTED NONE DETECTED    Comment: (NOTE) DRUG SCREEN FOR MEDICAL PURPOSES ONLY.  IF CONFIRMATION IS NEEDED FOR ANY PURPOSE, NOTIFY LAB WITHIN 5 DAYS.  LOWEST DETECTABLE LIMITS FOR URINE DRUG SCREEN Drug Class                     Cutoff  (ng/mL) Amphetamine and metabolites    1000 Barbiturate and metabolites    200 Benzodiazepine                 200 Opiates and metabolites  300 Cocaine and metabolites        300 THC                            50 Performed at [facility], [place] [address] [place], [place] [address]    DG Chest Port 1 View  Result Date: 12/19/2022 CLINICAL DATA:  Questionable sepsis-evaluate for abnormality. EXAM: PORTABLE CHEST 1 VIEW COMPARISON:  10/24/2020 FINDINGS: Low lung volumes accentuate cardiomediastinal silhouette and pulmonary vascularity. Streaky opacities in the left base favored to represent atelectasis. No pleural effusion or pneumothorax. No acute osseous abnormality. IMPRESSION: Low lung volumes with left basilar atelectasis. Electronically Signed   By: Placido Sou M.D.   On: 12/19/2022 02:02    Pending Labs Unresulted Labs (From admission, onward)    None       Vitals/Pain Today's Vitals   12/19/22 0902 12/19/22 0904 12/19/22 0905  BP: (!) 139/112    Pulse: (!) 105    Resp: (!) 22    Temp:   98.6 F (37 C)  TempSrc:   Oral  SpO2: 97%    PainSc:  6      Isolation Precautions No active isolations  Medications Medications  ondansetron (ZOFRAN-ODT) disintegrating tablet 4 mg (has no administration in time range)  vancomycin (VANCOCIN) IVPB 1000 mg/200 mL premix (has no administration in time range)    Mobility walks     Focused Assessments Erythremia to the right and left forearms per patient "Its from when I used a needle last two days ago." Pt having flights of ideas when assessing patient but able to answer orienting questions  R Recommendations: See Admitting Provider Note  Report given to:   Additional Notes:

## 2022-12-19 NOTE — Progress Notes (Signed)
FMTS Interim Progress Note  S: Patient assessed at bedside with Dr. Madison Hickman. RN attempting to replace IV. Patient relates his arm is much improved and feels the pain is well controlled. States prior to admission he could not even bend his arm but now can freely move his arm. Patient relates he lost his housing and stated "I've told the people that need to know I'm here".  O: BP (!) 100/56 (BP Location: Right Arm)   Pulse (!) 110   Temp 98.2 F (36.8 C) (Oral)   Resp 17   SpO2 100%    General: Well-appearing. Alert. NAD CV: RRR without murmur Pulm: CTAB. Normal WOB on RA. No wheezing Ext: Demarcated area of erythema and warmth within marker tracing. No fluctuance noted.  A/P: L forearm cellulitis Clinically improving and patient reports symptomatic improvement. -Cont Vancomycin -Pain management: Tylenol '650mg'$  q6h prn  Methamphetamine use Patient reports IVDU and housing stability. -TOC consult for substance use disorder resources and housing resources  Remainder of plan per day team.  Colletta Maryland, MD 12/19/2022, 8:36 PM PGY-1, [facility] pager [phone]

## 2022-12-19 NOTE — TOC Progression Note (Signed)
Transition of Care [facility]) - Progression Note    Patient Details  Name: Cory Floyd MRN: [id number] Date of Birth: [birth year]  Transition of Care [facility]) CM/SW [place], RN Phone Number: 12/19/2022, 2:17 PM  Clinical Narrative:    PCP set up for hospital follow up for January 03, 2023 at 3:50 pm.  Outpatient follow up placed in the discharge instructions regarding Outpatient counseling for substance abuse.  Patient with active history for IVDU.        Expected Discharge Plan and Services                                               Social Determinants of Health (SDOH) Interventions SDOH Screenings   Alcohol Screen: Low Risk  (05/02/2021)  Tobacco Use: High Risk (12/19/2022)    Readmission Risk Interventions     No data to display

## 2022-12-19 NOTE — Hospital Course (Addendum)
Cory Floyd is a 47 y.o.male with a history of polysubstance use and bipolar disorder who was admitted to the [facility] Medicine Teaching Service at [facility] for N/V. His hospital course is detailed below:  L arm cellulitis Admitted with erythema and warmth of L anticubital region in the setting of IVDU. Blood cultures obtained and he was started on Vancomycin with symptomatic improvement. Area of erythema, edema, and pain greatly improved by day of discharge. He was transitioned to oral regimen of bactrim BID for 4 more days.  Bipolar disorder Patient off medication and actively hallucinating upon admission. UDS positive for cocaine and methamphetamine. Psychiatry was consulted and restarted patient's home medication. He was discharged on regimen of abilify 10 mg daily, depakote 1000 mg nightly, remeron 15 mg nightly.   Social Difficulties  Case management was consulted to assist patient with housing resources. Patient re-connected with his ACT team while inpatient. TOC gave him resources for PCP.    Other chronic conditions were medically managed with home medications and formulary alternatives as necessary (abilify, depakote)  PCP Follow-up Recommendations:  Depakote level in 3 days  F/u if he has been able to get in contact with his ACT team  Substance use cessation

## 2022-12-19 NOTE — ED Triage Notes (Signed)
Pt here for second time today-was seen this morning and declined admission for cellulitis so was discharged with prescription for abx. Patient states he left to go find somewhere to live/pay his rent and became nauseas and started vomiting on the way so came back. Also having continual AVH. States last drug use two days ago.

## 2022-12-19 NOTE — Assessment & Plan Note (Addendum)
Patient was seen in the ED overnight and recommended admission but he elected to go home. Returns today after vomiting/diaphoresis at home.  Afebrile and without a white count here today. Per review of ED notes, area of involved erythema has expanded beyond previous margins.  - Admit to med-surg - Vancomycin monotherapy as has anaphylaxis to cephalosporins - Blood cultures pending - Acetaminophen PRN for fever/pain - Zofran x1 for nausea

## 2022-12-20 ENCOUNTER — Other Ambulatory Visit (HOSPITAL_COMMUNITY): Payer: Self-pay

## 2022-12-20 DIAGNOSIS — R441 Visual hallucinations: Secondary | ICD-10-CM | POA: Diagnosis not present

## 2022-12-20 DIAGNOSIS — F314 Bipolar disorder, current episode depressed, severe, without psychotic features: Secondary | ICD-10-CM | POA: Diagnosis not present

## 2022-12-20 DIAGNOSIS — L03114 Cellulitis of left upper limb: Secondary | ICD-10-CM | POA: Diagnosis not present

## 2022-12-20 LAB — HIV ANTIBODY (ROUTINE TESTING W REFLEX): HIV Screen 4th Generation wRfx: NONREACTIVE

## 2022-12-20 MED ORDER — MIRTAZAPINE 15 MG PO TABS
15.0000 mg | ORAL_TABLET | Freq: Every day | ORAL | 0 refills | Status: DC
  Filled 2022-12-20: qty 30, 30d supply, fill #0

## 2022-12-20 MED ORDER — ACETAMINOPHEN 325 MG PO TABS: 650.0000 mg | ORAL_TABLET | Freq: Four times a day (QID) | ORAL | Status: DC | PRN

## 2022-12-20 MED ORDER — ARIPIPRAZOLE 10 MG PO TABS
10.0000 mg | ORAL_TABLET | Freq: Every day | ORAL | 0 refills | Status: DC
  Filled 2022-12-20: qty 30, 30d supply, fill #0

## 2022-12-20 MED ORDER — DIVALPROEX SODIUM ER 500 MG PO TB24
1000.0000 mg | ORAL_TABLET | Freq: Every day | ORAL | 0 refills | Status: DC
  Filled 2022-12-20: qty 60, 30d supply, fill #0

## 2022-12-20 MED ORDER — CEPHALEXIN 500 MG PO CAPS: 500.0000 mg | ORAL_CAPSULE | Freq: Three times a day (TID) | ORAL | Status: DC

## 2022-12-20 MED ORDER — SULFAMETHOXAZOLE-TRIMETHOPRIM 800-160 MG PO TABS
1.0000 | ORAL_TABLET | Freq: Two times a day (BID) | ORAL | Status: DC
  Filled 2022-12-20: qty 1

## 2022-12-20 MED ORDER — SULFAMETHOXAZOLE-TRIMETHOPRIM 800-160 MG PO TABS
1.0000 | ORAL_TABLET | Freq: Two times a day (BID) | ORAL | 0 refills | Status: AC
  Filled 2022-12-20: qty 8, 4d supply, fill #0

## 2022-12-20 NOTE — Consult Note (Signed)
[place] ASSESSMENT   Reason for Consult:  Psychosis Referring Physician:  Dr. Joelyn Oms Patient Identification: Cory Floyd MRN:  [id number] ED Chief Complaint: Cellulitis  Diagnosis:  Principal Problem:   Cellulitis Active Problems:   Bipolar disorder with severe depression [facility])   ED Assessment Time Calculation: Start Time: 0930 Stop Time: 1030 Total Time in Minutes (Assessment Completion): 60   HPI:   Cory Floyd is a 47 y.o. male patient admitted medically for 12/19/2022  8:58 AM for celluoitis. He carries the psychiatric diagnoses of bipolar disorder, multiple substance use disorders, etoh use in unspecified remission and has a past medical history of fatty liver, arthritis, hypertension, DM. Psychiatry was consulted for Auditory and visual hallucinations-- off all psych meds by Dr. Joelyn Oms.    Pt was seen by psychiatry, Dr. Lovette Cliche, yesterday. See assessment below "His current presentation of rapid onset of psychotic symptoms after using methamphetamine and cocaine is most consistent with substance induced psychosis. He has a history of bipolar disorder although unclear if he has had manic or psychotic episodes outside of substance use. At the time I saw him, his priorly effective medications had been restarted by primary team; briefly discussed this with pt and he assented. He was not compliant with medications prior to admission as evidenced by pt report. On initial examination, patient was paranoid, crying, and difficult to engage in interview - feel best course of action is to re attempt tomorrow as he is not suicidal, not homicidal, not an acute danger to himself, and sleep will help with both his substance induced psychosis (preventing it from turning into full blown mania) and his mood issues. Please see plan below for detailed recommendations."  Pt was started on Abilify 10 mg, Depakote 1,000 mg, and Remeron 15 mg. Due to likely substance induced psychosis, he was recommended  for reevaluation.   Subjective:   Pt seen in his room at [facility] for face to face psychiatric reevaluation. Pt is pleasant, appears to be in no acute distress, and willing to engage in meaningful conversation. Pt explains he was in prison for 18 months, and was released on Feb 21st. Pt stated prison only gave him a 3 day supply of medications, and no follow up or housing resources. Pt stated his auditory hallucinations returned when he ran out of his medications, and he resulted to using substances. Pt states to his knowledge he only smoked meth, he was surprised to hear that cocaine was also positive in his urine toxicology report. Pt stated he does not remember much of first presenting to hospital, but is feeling "much more clear today." Pt denies suicidal ideations. Pt stated "I am feeling pretty good. I called [employer] to get my ACTT restarted to keep up with my meds, and I am going to go look at a half way house today." Pt denies homicidal ideations. He denies auditory or visual hallucinations. Denies any feelings of paranoia.   We spoke about his substance use, he does not want any additional substance abuse treatment at this time. Pt stated "I am not an addict. I just use to escape my emotions and feelings, but as soon as I get things back on track I wont need to use drugs anymore. I feel good about staying sober." Pt denies alcohol use, states he rarely consumes ETOH. He does acknowledge that his substance use likely induced psychosis especially while being off psychiatric medications. He understood the need to discontinue all illicit substances and remain compliant with medications.  Pt is requesting to discharge today. He is able to contract for safety, he demonstrates good judgement and insight with his acknowledgment of substance abuse and already taking steps to set up outpatient care. He does not appear to be RTIS or internally preoccupied, no concerns for psychosis or mania at this time. Speech is  normal in rate and tone. Thought content intact. Alert and oriented x4. Unfortunately patient reports he has no family we are able to contact, both of his parents have passed away and he has no communication with anyone else. He reports having a few friends in the area to rely on for social support. He does not want me to contact anyone at this time. Will psychiatrically clear patient.  Past Psychiatric History:  See above  Risk to Self or Others: Is the patient at risk to self? No Has the patient been a risk to self in the past 6 months? No Has the patient been a risk to self within the distant past? No Is the patient a risk to others? No Has the patient been a risk to others in the past 6 months? No Has the patient been a risk to others within the distant past? No  [place] Scale:  [place] ED from 12/19/2022 in 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   Anginal pain ([place])    Arthritis    Bipolar 1 disorder ([place])    COVID-19    Depressed    Diabetes mellitus without complication ([place])    Dyspnea    Fatty liver    Hypertension    Schizophrenia ([place])     Past Surgical History:  Procedure Laterality Date   COLONOSCOPY  02/05/2014   diverticulosis, hyperplastic polyp x 2   fracture arm Right    KNEE SURGERY Right 2013   NASAL SEPTUM SURGERY     NASAL TURBINATE REDUCTION Bilateral 12/12/2016   Procedure: TURBINATE REDUCTION/SUBMUCOSAL RESECTION;  Surgeon: Beverly Gust, MD;  Location: [facility] ORS;  Service: ENT;  Laterality: Bilateral;   Family History:  Family History  Problem Relation Age of Onset   Hepatitis C Mother    Cancer Mother    Cancer Maternal Aunt     Hypertension Maternal Grandmother    Social History:  Social History   Substance and Sexual Activity  Alcohol Use Yes   Comment: 1/2-5th pint of liquor     Social History   Substance and Sexual Activity  Drug Use Yes   Types: Cocaine, Marijuana   Comment: heroin    Social History   Socioeconomic History   Marital status: Divorced    Spouse name: Not on file   Number of children: Not on file   Years of education: Not on file   Highest education level: Not on file  Occupational History   Not on file  Tobacco Use   Smoking status: Every Day    Packs/day: 1.00    Years: 20.00    Total pack years: 20.00    Types: Cigarettes   Smokeless tobacco: Never  Vaping Use   Vaping Use: Never used  Substance and Sexual Activity   Alcohol use: Yes  Comment: 1/2-5th pint of liquor   Drug use: Yes    Types: Cocaine, Marijuana    Comment: heroin   Sexual activity: Yes    Birth control/protection: None  Other Topics Concern   Not on file  Social History Narrative   Not on file   Social Determinants of Health   Financial Resource Strain: Not on file  Food Insecurity: Not on file  Transportation Needs: Not on file  Physical Activity: Not on file  Stress: Not on file  Social Connections: Not on file    Allergies:   Allergies  Allergen Reactions   Carrot [Daucus Carota] Anaphylaxis and Rash   Penicillins Anaphylaxis and Hives    Has patient had a PCN reaction causing immediate rash, facial/tongue/throat swelling, SOB or lightheadedness with hypotension: Yes Has patient had a PCN reaction causing severe rash involving mucus membranes or skin necrosis: No Has patient had a PCN reaction that required hospitalization Yes Has patient had a PCN reaction occurring within the last 10 years: No If all of the above answers are "NO", then may proceed with Cephalosporin use.    Ampicillin     Labs:  Results for orders placed or performed during the hospital encounter of 12/19/22  (from the past 48 hour(s))  Lipase, blood     Status: None   Collection Time: 12/19/22  9:35 AM  Result Value Ref Range   Lipase 26 11 - 51 U/L    Comment: Performed at [facility], [place] [address] [place], [place] [address]  Comprehensive metabolic panel     Status: Abnormal   Collection Time: 12/19/22  9:35 AM  Result Value Ref Range   Sodium 138 135 - 145 mmol/L   Potassium 3.5 3.5 - 5.1 mmol/L   Chloride 103 98 - 111 mmol/L   CO2 24 22 - 32 mmol/L   Glucose, Bld 121 (H) 70 - 99 mg/dL    Comment: Glucose reference range applies only to samples taken after fasting for at least 8 hours.   BUN 10 6 - 20 mg/dL   Creatinine, Ser 0.87 0.61 - 1.24 mg/dL   Calcium 9.0 8.9 - 10.3 mg/dL   Total Protein 7.2 6.5 - 8.1 g/dL   Albumin 3.8 3.5 - 5.0 g/dL   AST 56 (H) 15 - 41 U/L   ALT 44 0 - 44 U/L   Alkaline Phosphatase 59 38 - 126 U/L   Total Bilirubin 1.0 0.3 - 1.2 mg/dL   GFR, Estimated >60 >60 mL/min    Comment: (NOTE) Calculated using the CKD-EPI Creatinine Equation (2021)    Anion gap 11 5 - 15    Comment: Performed at [place] [address], [place], [place] [address]  CBC     Status: Abnormal   Collection Time: 12/19/22  9:35 AM  Result Value Ref Range   WBC 9.4 4.0 - 10.5 K/uL   RBC 3.92 (L) 4.22 - 5.81 MIL/uL   Hemoglobin 12.0 (L) 13.0 - 17.0 g/dL   HCT 35.5 (L) 39.0 - 52.0 %   MCV 90.6 80.0 - 100.0 fL   MCH 30.6 26.0 - 34.0 pg   MCHC 33.8 30.0 - 36.0 g/dL   RDW 13.3 11.5 - 15.5 %   Platelets 156 150 - 400 K/uL   nRBC 0.0 0.0 - 0.2 %    Comment: Performed at [facility], [place] [address] [place], [place] [address]  Urinalysis, Routine w reflex microscopic -Urine, Clean  Catch     Status: Abnormal   Collection Time: 12/19/22  9:45 AM  Result Value Ref Range   Color, Urine YELLOW YELLOW   APPearance CLEAR CLEAR   Specific Gravity, Urine 1.020 1.005 - 1.030   pH 6.0 5.0 - 8.0   Glucose, UA NEGATIVE NEGATIVE mg/dL   Hgb urine dipstick NEGATIVE  NEGATIVE   Bilirubin Urine NEGATIVE NEGATIVE   Ketones, ur 15 (A) NEGATIVE mg/dL   Protein, ur NEGATIVE NEGATIVE mg/dL   Nitrite NEGATIVE NEGATIVE   Leukocytes,Ua NEGATIVE NEGATIVE    Comment: Microscopic not done on urines with negative protein, blood, leukocytes, nitrite, or glucose < 500 mg/dL. Performed at [facility], [place] [address], [place], [place] [address]   Rapid urine drug screen (hospital performed)     Status: Abnormal   Collection Time: 12/19/22  9:45 AM  Result Value Ref Range   Opiates NONE DETECTED NONE DETECTED   Cocaine POSITIVE (A) NONE DETECTED   Benzodiazepines NONE DETECTED NONE DETECTED   Amphetamines POSITIVE (A) NONE DETECTED   Tetrahydrocannabinol NONE DETECTED NONE DETECTED   Barbiturates NONE DETECTED NONE DETECTED    Comment: (NOTE) DRUG SCREEN FOR MEDICAL PURPOSES ONLY.  IF CONFIRMATION IS NEEDED FOR ANY PURPOSE, NOTIFY LAB WITHIN 5 DAYS.  LOWEST DETECTABLE LIMITS FOR URINE DRUG SCREEN Drug Class                     Cutoff (ng/mL) Amphetamine and metabolites    1000 Barbiturate and metabolites    200 Benzodiazepine                 200 Opiates and metabolites        300 Cocaine and metabolites        300 THC                            50 Performed at [facility], [place] [address], [place], [place] [address]     Current Facility-Administered Medications  Medication Dose Route Frequency Provider Last Rate Last Admin   acetaminophen (TYLENOL) tablet 650 mg  650 mg Oral Q6H PRN Eppie Gibson, MD   650 mg at 12/19/22 2301   ARIPiprazole (ABILIFY) tablet 10 mg  10 mg Oral Daily Eppie Gibson, MD   10 mg at 12/20/22 [id number]   divalproex (DEPAKOTE ER) 24 hr tablet 1,000 mg  1,000 mg Oral QHS Jim Like B, MD   1,000 mg at 12/19/22 2013   enoxaparin (LOVENOX) injection 40 mg  40 mg Subcutaneous Q24H Jim Like B, MD   40 mg at 12/20/22 [id number]   mirtazapine (REMERON) tablet 15 mg  15 mg Oral QHS Eppie Gibson, MD   15 mg at  12/19/22 2013   traZODone (DESYREL) tablet 100 mg  100 mg Oral QHS PRN Eppie Gibson, MD       vancomycin (VANCOCIN) IVPB 1000 mg/200 mL premix  1,000 mg Intravenous Q12H Hammons, Theone Murdoch, RPH 200 mL/hr at 12/19/22 2301 1,000 mg at 12/19/22 2301    Psychiatric Specialty Exam: Presentation  General Appearance:  Appropriate for Environment  Eye Contact: Good  Speech: Clear and Coherent  Speech Volume: Normal  Handedness: -- (unknown)   Mood and Affect  Mood: Euthymic  Affect: Congruent   Thought Process  Thought Processes: Coherent  Descriptions of Associations:Intact  Orientation:Full (Time, Place and Person)  Thought Content:Logical; WDL  History of  Schizophrenia/Schizoaffective disorder:No data recorded Duration of Psychotic Symptoms:No data recorded Hallucinations:Hallucinations: None  Ideas of Reference:None  Suicidal Thoughts:Suicidal Thoughts: No  Homicidal Thoughts:Homicidal Thoughts: No   Sensorium  Memory: Immediate Fair; Recent Fair  Judgment: Good  Insight: Good   Executive Functions  Concentration: Good  Attention Span: Good  Recall: Good  Fund of Knowledge: Good  Language: Good   Psychomotor Activity  Psychomotor Activity: Psychomotor Activity: Normal   Assets  Assets: Communication Skills; Leisure Time; Physical Health; Resilience; Desire for Improvement    Sleep  Sleep: Sleep: Good   Physical Exam: Physical Exam Neurological:     Mental Status: He is alert and oriented to person, place, and time.  Psychiatric:        Attention and Perception: Attention normal.        Mood and Affect: Mood normal.        Speech: Speech normal.        Behavior: Behavior is cooperative.        Thought Content: Thought content normal.    Review of Systems  Constitutional:        Cellulitis left forarm  Psychiatric/Behavioral:  Positive for substance abuse.   All other systems reviewed and are  negative.  Blood pressure 128/87, pulse 95, temperature 98.2 F (36.8 C), temperature source Oral, resp. rate 17, SpO2 99 %. There is no height or weight on file to calculate BMI.  Medical Decision Making: Pt case reviewed and discussed with Dr. Lovette Cliche. Pt does not meet criteria for inpatient psychiatric treatment. Will psychiatrically clear patient at this time.   - Valproic Acid Level to be drawn in 3 days - please send 30 day supply of medications - resources for shelters, therapy, OP follow up, and substance abuse added to AVS  Disposition: No evidence of imminent risk to self or others at present.   Patient does not meet criteria for psychiatric inpatient admission. Supportive therapy provided about ongoing stressors. Discussed crisis plan, support from social network, calling 911, coming to the Emergency Department, and calling Suicide Hotline.  Vesta Mixer, NP 12/20/2022 10:39 AM

## 2022-12-20 NOTE — Discharge Instructions (Addendum)
  Outpatient psychiatric Services  Walk in hours for medication management Monday, Wednesday, Thursday, and Friday from 8:00 AM to 11:00 AM Recommend arriving by by 7:30 AM.  It is first come first serve.    Walk in hours for therapy intake Monday and Wednesday only 8:00 AM to 11:00 AM Encouraged to arrive by 7:30 AM.  It is first come first serve   Inpatient patient psychiatric services [facility] offers comprehensive behavioral heath care services for mental health and substance abuse treatment.  Social work can also assist with referral to or getting you into a rehabilitation program short or long term    _______________________________________________________  Dear Lendon Collar,   Thank you so much for allowing [place] to be part of your care!  You were admitted to [facility] for substance induced psychosis and cellulitis, which is a skin infection.    POST-HOSPITAL & CARE INSTRUCTIONS Please continue bactrim (your antibiotic) for another 4 days.  Please let PCP/Specialists know of any changes that were made.  Please see medications section of this packet for any medication changes.   DOCTOR'S APPOINTMENT & FOLLOW UP CARE INSTRUCTIONS  Future Appointments  Date Time Provider [place]  01/03/2023  3:50 PM Kerin Perna, NP [facility] None    RETURN PRECAUTIONS: If you have worsening skin infection, fever, chills.   Take care and be well!  [facility]  [place], [place] [address] [phone]

## 2022-12-24 LAB — CULTURE, BLOOD (ROUTINE X 2)
Culture: NO GROWTH
Culture: NO GROWTH
Special Requests: ADEQUATE
Special Requests: ADEQUATE

## 2022-12-26 ENCOUNTER — Inpatient Hospital Stay (HOSPITAL_COMMUNITY)
Admission: EM | Admit: 2022-12-26 | Discharge: 2022-12-30 | DRG: 603 | Disposition: A | Discharge status: Other Acute Inpatient Hospital | Payer: Medicare (Managed Care) | Attending: Family Medicine | Admitting: Family Medicine

## 2022-12-26 ENCOUNTER — Other Ambulatory Visit: Payer: Self-pay

## 2022-12-26 ENCOUNTER — Observation Stay (HOSPITAL_COMMUNITY): Payer: Medicare (Managed Care)

## 2022-12-26 ENCOUNTER — Encounter (HOSPITAL_COMMUNITY): Payer: Self-pay

## 2022-12-26 ENCOUNTER — Emergency Department (HOSPITAL_COMMUNITY): Payer: Medicare (Managed Care)

## 2022-12-26 DIAGNOSIS — L03114 Cellulitis of left upper limb: Secondary | ICD-10-CM | POA: Diagnosis present

## 2022-12-26 DIAGNOSIS — L02413 Cutaneous abscess of right upper limb: Secondary | ICD-10-CM | POA: Diagnosis present

## 2022-12-26 DIAGNOSIS — L03113 Cellulitis of right upper limb: Principal | ICD-10-CM | POA: Diagnosis present

## 2022-12-26 DIAGNOSIS — Z608 Other problems related to social environment: Secondary | ICD-10-CM | POA: Diagnosis present

## 2022-12-26 DIAGNOSIS — E119 Type 2 diabetes mellitus without complications: Secondary | ICD-10-CM | POA: Diagnosis present

## 2022-12-26 DIAGNOSIS — Z79899 Other long term (current) drug therapy: Secondary | ICD-10-CM

## 2022-12-26 DIAGNOSIS — Z881 Allergy status to other antibiotic agents status: Secondary | ICD-10-CM

## 2022-12-26 DIAGNOSIS — L03111 Cellulitis of right axilla: Secondary | ICD-10-CM

## 2022-12-26 DIAGNOSIS — E871 Hypo-osmolality and hyponatremia: Secondary | ICD-10-CM

## 2022-12-26 DIAGNOSIS — D649 Anemia, unspecified: Secondary | ICD-10-CM | POA: Diagnosis present

## 2022-12-26 DIAGNOSIS — Z91018 Allergy to other foods: Secondary | ICD-10-CM

## 2022-12-26 DIAGNOSIS — F1590 Other stimulant use, unspecified, uncomplicated: Secondary | ICD-10-CM | POA: Diagnosis present

## 2022-12-26 DIAGNOSIS — L0390 Cellulitis, unspecified: Secondary | ICD-10-CM | POA: Diagnosis present

## 2022-12-26 DIAGNOSIS — Z1152 Encounter for screening for COVID-19: Secondary | ICD-10-CM

## 2022-12-26 DIAGNOSIS — R059 Cough, unspecified: Secondary | ICD-10-CM

## 2022-12-26 DIAGNOSIS — F1920 Other psychoactive substance dependence, uncomplicated: Secondary | ICD-10-CM | POA: Diagnosis present

## 2022-12-26 DIAGNOSIS — Z880 Allergy status to penicillin: Secondary | ICD-10-CM

## 2022-12-26 DIAGNOSIS — F319 Bipolar disorder, unspecified: Secondary | ICD-10-CM | POA: Diagnosis present

## 2022-12-26 DIAGNOSIS — I10 Essential (primary) hypertension: Secondary | ICD-10-CM | POA: Diagnosis present

## 2022-12-26 DIAGNOSIS — N179 Acute kidney failure, unspecified: Secondary | ICD-10-CM

## 2022-12-26 DIAGNOSIS — F17210 Nicotine dependence, cigarettes, uncomplicated: Secondary | ICD-10-CM | POA: Diagnosis present

## 2022-12-26 DIAGNOSIS — E669 Obesity, unspecified: Secondary | ICD-10-CM | POA: Diagnosis present

## 2022-12-26 DIAGNOSIS — Z6833 Body mass index (BMI) 33.0-33.9, adult: Secondary | ICD-10-CM

## 2022-12-26 DIAGNOSIS — L02414 Cutaneous abscess of left upper limb: Secondary | ICD-10-CM

## 2022-12-26 DIAGNOSIS — R062 Wheezing: Secondary | ICD-10-CM | POA: Diagnosis present

## 2022-12-26 DIAGNOSIS — R45851 Suicidal ideations: Secondary | ICD-10-CM | POA: Diagnosis present

## 2022-12-26 LAB — LACTIC ACID, PLASMA
Lactic Acid, Venous: 1.3 mmol/L (ref 0.5–1.9)
Lactic Acid, Venous: 1.5 mmol/L (ref 0.5–1.9)

## 2022-12-26 LAB — CBC WITH DIFFERENTIAL/PLATELET
Abs Immature Granulocytes: 0.02 10*3/uL (ref 0.00–0.07)
Basophils Absolute: 0 10*3/uL (ref 0.0–0.1)
Basophils Relative: 0 %
Eosinophils Absolute: 0 10*3/uL (ref 0.0–0.5)
Eosinophils Relative: 1 %
HCT: 37.4 % — ABNORMAL LOW (ref 39.0–52.0)
Hemoglobin: 12.4 g/dL — ABNORMAL LOW (ref 13.0–17.0)
Immature Granulocytes: 0 %
Lymphocytes Relative: 13 %
Lymphs Abs: 1.1 10*3/uL (ref 0.7–4.0)
MCH: 29.9 pg (ref 26.0–34.0)
MCHC: 33.2 g/dL (ref 30.0–36.0)
MCV: 90.1 fL (ref 80.0–100.0)
Monocytes Absolute: 0.8 10*3/uL (ref 0.1–1.0)
Monocytes Relative: 9 %
Neutro Abs: 6.4 10*3/uL (ref 1.7–7.7)
Neutrophils Relative %: 77 %
Platelets: 210 10*3/uL (ref 150–400)
RBC: 4.15 MIL/uL — ABNORMAL LOW (ref 4.22–5.81)
RDW: 13.2 % (ref 11.5–15.5)
WBC: 8.3 10*3/uL (ref 4.0–10.5)
nRBC: 0 % (ref 0.0–0.2)

## 2022-12-26 LAB — COMPREHENSIVE METABOLIC PANEL
ALT: 33 U/L (ref 0–44)
AST: 29 U/L (ref 15–41)
Albumin: 3.7 g/dL (ref 3.5–5.0)
Alkaline Phosphatase: 66 U/L (ref 38–126)
Anion gap: 9 (ref 5–15)
BUN: 20 mg/dL (ref 6–20)
CO2: 26 mmol/L (ref 22–32)
Calcium: 9.1 mg/dL (ref 8.9–10.3)
Chloride: 98 mmol/L (ref 98–111)
Creatinine, Ser: 1.51 mg/dL — ABNORMAL HIGH (ref 0.61–1.24)
GFR, Estimated: 57 mL/min — ABNORMAL LOW (ref >60)
Glucose, Bld: 112 mg/dL — ABNORMAL HIGH (ref 70–99)
Potassium: 3.5 mmol/L (ref 3.5–5.1)
Sodium: 133 mmol/L — ABNORMAL LOW (ref 135–145)
Total Bilirubin: 0.7 mg/dL (ref 0.3–1.2)
Total Protein: 7.4 g/dL (ref 6.5–8.1)

## 2022-12-26 LAB — RESP PANEL BY RT-PCR (RSV, FLU A&B, COVID)  RVPGX2
Influenza A by PCR: NEGATIVE
Influenza B by PCR: NEGATIVE
Resp Syncytial Virus by PCR: NEGATIVE
SARS Coronavirus 2 by RT PCR: NEGATIVE

## 2022-12-26 MED ORDER — ARIPIPRAZOLE 10 MG PO TABS
10.0000 mg | ORAL_TABLET | Freq: Every day | ORAL | Status: DC
  Administered 2022-12-27 – 2022-12-30 (×3): 10 mg via ORAL
  Filled 2022-12-26 (×5): qty 1

## 2022-12-26 MED ORDER — VANCOMYCIN HCL 1500 MG/300ML IV SOLN: 1500.0000 mg | INTRAVENOUS | Status: DC

## 2022-12-26 MED ORDER — SODIUM CHLORIDE 0.9 % IV BOLUS
1000.0000 mL | Freq: Once | INTRAVENOUS | Status: AC
  Administered 2022-12-26: 1000 mL via INTRAVENOUS

## 2022-12-26 MED ORDER — VANCOMYCIN HCL 2000 MG/400ML IV SOLN
2000.0000 mg | Freq: Once | INTRAVENOUS | Status: AC
  Administered 2022-12-26: 2000 mg via INTRAVENOUS
  Filled 2022-12-26: qty 400

## 2022-12-26 MED ORDER — ENOXAPARIN SODIUM 40 MG/0.4ML IJ SOSY
40.0000 mg | PREFILLED_SYRINGE | INTRAMUSCULAR | Status: DC
  Administered 2022-12-27 – 2022-12-30 (×4): 40 mg via SUBCUTANEOUS
  Filled 2022-12-26 (×4): qty 0.4

## 2022-12-26 MED ORDER — ADULT MULTIVITAMIN W/MINERALS CH
1.0000 | ORAL_TABLET | Freq: Every day | ORAL | Status: DC
  Administered 2022-12-27 – 2022-12-30 (×4): 1 via ORAL
  Filled 2022-12-26 (×4): qty 1

## 2022-12-26 MED ORDER — ACETAMINOPHEN 500 MG PO TABS
1000.0000 mg | ORAL_TABLET | Freq: Four times a day (QID) | ORAL | Status: DC | PRN
  Administered 2022-12-27: 1000 mg via ORAL
  Filled 2022-12-26: qty 2

## 2022-12-26 MED ORDER — DIVALPROEX SODIUM ER 500 MG PO TB24
1000.0000 mg | ORAL_TABLET | Freq: Every day | ORAL | Status: DC
  Administered 2022-12-26 – 2022-12-29 (×4): 1000 mg via ORAL
  Filled 2022-12-26 (×5): qty 2

## 2022-12-26 MED ORDER — MIRTAZAPINE 15 MG PO TABS
15.0000 mg | ORAL_TABLET | Freq: Every day | ORAL | Status: DC
  Administered 2022-12-26 – 2022-12-29 (×4): 15 mg via ORAL
  Filled 2022-12-26 (×4): qty 1

## 2022-12-26 NOTE — ED Provider Notes (Signed)
[facility] Provider Note   CSN: [id number] Arrival date & time: 12/26/22  1815     History  Chief Complaint  Patient presents with   Abscess    Cory Floyd is a 47 y.o. male.  Patient complains of redness and swelling to his right arm.  Patient admits to IV drug use.  Patient reports he shot up methamphetamine in his right arm 6 days ago.  Patient was recently hospitalized due to cellulitis in his left arm which improved with IV antibiotics.  Patient has a past medical history of polysubstance abuse IV drug use.  Patient has a history of bipolar disorder and was recently restarted on his medications.  Complains of feeling short of breath.  Patient thinks he may have picked up some type of infection.  Patient complains of a cough and congestion.  Patient reports increasing recent depression.  He reports suicidal thoughts.   The history is provided by the patient. No language interpreter was used.  Abscess Abscess quality: redness and warmth   Red streaking: no   Progression:  Worsening Chronicity:  New Context: injected drug use   Relieved by:  Nothing Worsened by:  Nothing Ineffective treatments:  None tried Associated symptoms: nausea   Risk factors: prior abscess        Home Medications Prior to Admission medications   Medication Sig Start Date End Date Taking? Authorizing Provider  acetaminophen (TYLENOL) 325 MG tablet Take 2 tablets (650 mg total) by mouth every 6 (six) hours as needed for mild pain (or Fever >/= 101). 12/20/22   Holley Bouche, MD  acetaminophen (TYLENOL) 500 MG tablet Take 1,000 mg by mouth daily as needed for headache.    [provider]  ARIPiprazole (ABILIFY) 10 MG tablet Take 1 tablet (10 mg total) by mouth daily. 12/20/22   Holley Bouche, MD  divalproex (DEPAKOTE ER) 500 MG 24 hr tablet Take 2 tablets (1,000 mg total) by mouth at bedtime. 12/20/22   Holley Bouche, MD  mirtazapine (REMERON)  15 MG tablet Take 1 tablet (15 mg total) by mouth at bedtime. For depression/sleep 12/20/22   Holley Bouche, MD  fluticasone (FLONASE) 50 MCG/ACT nasal spray Place 1 spray into both nostrils daily. Patient not taking: Reported on 01/23/2021 08/24/20 01/23/21  Petrucelli, Aldona Bar R, [name]      Allergies    Carrot [daucus carota], Penicillins, and [place]en [ampicillin]    Review of Systems   Review of Systems  Gastrointestinal:  Positive for nausea.  All other systems reviewed and are negative.   Physical Exam Updated Vital Signs BP 103/69 (BP Location: Left Arm)   Pulse (!) 121   Temp 98.3 F (36.8 C)   Resp 20   Ht '5\' 10"'$  (1.778 m)   Wt 106.6 kg   SpO2 99%   BMI 33.72 kg/m  Physical Exam Vitals and nursing note reviewed.  Constitutional:      Appearance: He is well-developed.  HENT:     Head: Normocephalic.  Cardiovascular:     Rate and Rhythm: Tachycardia present.  Pulmonary:     Effort: Pulmonary effort is normal.  Abdominal:     General: There is no distension.  Musculoskeletal:        General: Swelling and tenderness present.     Cervical back: Normal range of motion.     Comments: Swelling right arm from above axilla to wrist.  Vascular is intact  Skin:    General: Skin  is warm.  Neurological:     General: No focal deficit present.     Mental Status: He is alert and oriented to person, place, and time.  Psychiatric:        Mood and Affect: Mood normal.     ED Results / Procedures / Treatments   Labs (all labs ordered are listed, but only abnormal results are displayed) Labs Reviewed  CBC WITH DIFFERENTIAL/PLATELET - Abnormal; Notable for the following components:      Result Value   RBC 4.15 (*)    Hemoglobin 12.4 (*)    HCT 37.4 (*)    All other components within normal limits  COMPREHENSIVE METABOLIC PANEL - Abnormal; Notable for the following components:   Sodium 133 (*)    Glucose, Bld 112 (*)    Creatinine, Ser 1.51 (*)    GFR, Estimated 57 (*)     All other components within normal limits  LACTIC ACID, PLASMA  LACTIC ACID, PLASMA    EKG None  Radiology No results found.  Procedures Procedures    Medications Ordered in ED Medications - No data to display  ED Course/ Medical Decision Making/ A&P                             Medical Decision Making Patient reports injecting methamphetamine into right axilla.  Patient complains of redness and swelling.  Patient was recently discharged after developing a cellulitis to his left arm.  Amount and/or Complexity of Data Reviewed External Data Reviewed: notes.    Details: Family practice notes reviewed from recent admission Labs: ordered. Decision-making details documented in ED Course.    Details: Labs ordered reviewed and interpreted lactic acid is 1.3 white blood cell count is normal Radiology: ordered. Discussion of management or test interpretation with external provider(s): Pharmacy consulted for IV antibiotic management Family practice consulted for admission.  Patient was just discharged from family practice service on 28 February.   Risk Prescription drug management. Decision regarding hospitalization.           Final Clinical Impression(s) / ED Diagnoses Final diagnoses:  Cellulitis of right axilla    Rx / DC Orders ED Discharge Orders     None         Sidney Ace 12/26/22 2205    Dorie Rank, MD 12/27/22 [phone]

## 2022-12-26 NOTE — ED Triage Notes (Signed)
Pt has an abscess approx the size of a golf ball on left AC area. The abscess is erythematous. Pt has an abscess on the right inner arm near the [facility] area. The inner arm is erythematous and the redness is starting to spread down the armx2d. Pt has a dry cough.

## 2022-12-26 NOTE — Assessment & Plan Note (Addendum)
Psychiatry recommending inpatient psychiatric care after medical clearance, patient continued to be amenable to this. Denies SI today. 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

## 2022-12-26 NOTE — ED Provider Triage Note (Signed)
Emergency Medicine Provider Triage Evaluation Note  Cory Floyd , a 47 y.o. male  was evaluated in triage.  Pt complains of abscesses.  Patient reports that he had significant redness and swelling with pain in bilateral antecubital aspects of both arms and was popped up in the last 2 days.  He reports recent IV drug use as recent as 6 days ago.  Denies any subjective fevers but does feel like he is running warmer than normal.  No chest pain, shortness of breath, abdominal pain, nausea, vomiting, diarrhea.  Review of Systems  Positive: As above Negative: As above  Physical Exam  BP 103/69 (BP Location: Left Arm)   Pulse (!) 121   Temp 98.3 F (36.8 C)   Resp 20   Ht '5\' 10"'$  (1.778 m)   Wt 106.6 kg   SpO2 99%   BMI 33.72 kg/m  Gen:   Awake, no distress   Resp:  Normal effort  MSK:   Moves extremities without difficulty  Other:  Significant erythema and warmth to bilateral antecubital regions  Medical Decision Making  Medically screening exam initiated at 7:24 PM.  Appropriate orders placed.  Cory Floyd was informed that the remainder of the evaluation will be completed by another provider, this initial triage assessment does not replace that evaluation, and the importance of remaining in the ED until their evaluation is complete.     Luvenia Heller, [name] 12/26/22 1926

## 2022-12-26 NOTE — Progress Notes (Signed)
Pharmacy Antibiotic Note  Cory Floyd is a 47 y.o. male for which pharmacy has been consulted for vancomycin dosing for cellulitis.  Patient with a history of methamphetamine use. Admitted for L arm cellulitis x1 week ago, disharged on Bactrim. Patient presenting with redness and swelling to his right arm.  SCr 1.51 (~1.1 baseline) WBC 8.3; LA 1.3; T 98.3; HR 121; RR 20  Plan: Vancomycin 2000 mg once then 1500 mg q24hr (eAUC 505.5) unless change in renal function Trend WBC, Fever, Renal function F/u cultures, clinical course, WBC De-escalate when able  Height: '5\' 10"'$  (177.8 cm) Weight: 106.6 kg (235 lb 0.2 oz) IBW/kg (Calculated) : 73  Temp (24hrs), Avg:98.3 F (36.8 C), Min:98.3 F (36.8 C), Max:98.3 F (36.8 C)  Recent Labs  Lab 12/26/22 1932  WBC 8.3  CREATININE 1.51*  LATICACIDVEN 1.3    Estimated Creatinine Clearance: 73.1 mL/min (A) (by C-G formula based on SCr of 1.51 mg/dL (H)).    Allergies  Allergen Reactions   Carrot [Daucus Carota] Anaphylaxis and Rash   Penicillins Anaphylaxis and Hives    Has patient had a PCN reaction causing immediate rash, facial/tongue/throat swelling, SOB or lightheadedness with hypotension: Yes Has patient had a PCN reaction causing severe rash involving mucus membranes or skin necrosis: No Has patient had a PCN reaction that required hospitalization Yes Has patient had a PCN reaction occurring within the last 10 years: No If all of the above answers are "NO", then may proceed with Cephalosporin use.    Principen [Ampicillin] Anaphylaxis and Hives   Microbiology results: Pending  Thank you for allowing pharmacy to be a part of this patient's care.  Lorelei Pont, PharmD, BCPS 12/26/2022 9:47 PM ED Clinical Pharmacist -  [phone]

## 2022-12-26 NOTE — Hospital Course (Addendum)
Cory Floyd is a 46 y.o.male with a history of polysubstance use and bipolar disorder  who was admitted to the [facility] Medicine Teaching Service at [facility] for R arm pain. His hospital course is detailed below:  Bilateral AC cellulitis Admitted with new onset R AC cellulitis in the setting of IVDU. Admitted on 12/19/2022 from L AC cellulitis that was treated with IV abx but patient did not complete oral course. US showed potentially drainable abscess on L AC. Orthopedics consulted and recommended I&D of abscess of L AC. Patient received IV Vancomycin x 36 hours and transitioned to ***. He was discharged on *** day course.  Bipolar disorder Suicidal ideation/attempt Patient states he took a large amount of methamphetamine prior to admission with intention to take his life. Non-adherent with Bipolar medications and endorsing paranoia and hallucinations in the ED. Placed on suicide precautions and psychiatry consulted. Psychiatry recommended restarting home meds and inpatient psychiatric management once medically cleared. Patient discharged to ***.  IVDU - Methamphetamine Possible sharing of needles, obtained Hep C and HIV. Hep C antibody reactive. Hep C RNA PCR pending at the time of discharge.   PCP Follow-up Recommendations:  PFTs, cough and wheezing inpatient suspicious for underlying obstructive lung disease Normocytic anemia, consider outpatient workup

## 2022-12-26 NOTE — Discharge Summary (Signed)
[facility] Discharge Summary  Patient name: Cory Floyd Medical record number: [id number] Date of birth: 04-20-1976 Age: 47 y.o. Gender: male Date of Admission: 12/19/2022  Date of Discharge: 12/20/2022  Admitting Physician: Eppie Gibson, MD  Primary Care Provider: Pcp, No Consultants: Psychiatry   Indication for Hospitalization: Cellulitis   Discharge Diagnoses/Problem List:  Principal Problem for Admission: Cellulitis  Other Problems addressed during stay:  Principal Problem:   Cellulitis Active Problems:   Bipolar disorder with severe depression [facility])   Visual hallucinations    Brief Hospital Course:  Cory Floyd is a 47 y.o.male with a history of polysubstance use and bipolar disorder who was admitted to the [facility] Medicine Teaching Service at [facility] for N/V. His hospital course is detailed below:  L arm cellulitis Admitted with erythema and warmth of L anticubital region in the setting of IVDU. Blood cultures obtained and he was started on Vancomycin with symptomatic improvement. Area of erythema, edema, and pain greatly improved by day of discharge. He was transitioned to oral regimen of bactrim BID for 4 more days.  Bipolar disorder Patient off medication and actively hallucinating upon admission. UDS positive for cocaine and methamphetamine. Psychiatry was consulted and restarted patient's home medication. He was discharged on regimen of abilify 10 mg daily, depakote 1000 mg nightly, remeron 15 mg nightly.   Social Difficulties  Case management was consulted to assist patient with housing resources. Patient re-connected with his ACT team while inpatient. TOC gave him resources for PCP.    Other chronic conditions were medically managed with home medications and formulary alternatives as necessary (abilify, depakote)  PCP Follow-up Recommendations:  Depakote level in 3 days  F/u if he has been able to get in contact with his ACT  team  Substance use cessation   Disposition: home   Discharge Condition: Improved  Discharge Exam:  Vitals:   12/20/22 0359 12/20/22 0758  BP: 102/61 128/87  Pulse: 96 95  Resp:  17  Temp: 98.4 F (36.9 C) 98.2 F (36.8 C)  SpO2: 100% 99%   General: well appearing, in no acute distress CV: RRR, radial pulses equal and palpable, no BLE edema  Resp: Normal work of breathing on room air, CTAB Abd: Soft, non tender, non distended  Neuro: Alert & Oriented x 4  Psyc: appropriate thought process, able to teach back, no hallucinations  Derm: erythema around upper forearm, no fluctuance or induration   Significant Labs and Imaging:  UDS positive for cocaine and amphetamines  Urinalysis positive for 15 ketones  CBC within normal limits  CMP with glucose of 121 and AST 56  CXR: Low lung volumes with left basilar atelectasis.   Results/Tests Pending at Time of Discharge: None  Discharge Medications:  Allergies as of 12/20/2022       Reactions   Carrot [daucus Carota] Anaphylaxis, Rash   Penicillins Anaphylaxis, Hives   Has patient had a PCN reaction causing immediate rash, facial/tongue/throat swelling, SOB or lightheadedness with hypotension: Yes Has patient had a PCN reaction causing severe rash involving mucus membranes or skin necrosis: No Has patient had a PCN reaction that required hospitalization Yes Has patient had a PCN reaction occurring within the last 10 years: No If all of the above answers are "NO", then may proceed with Cephalosporin use.   Principen [ampicillin] Anaphylaxis, Hives        Medication List     STOP taking these medications    clindamycin 300 MG  capsule Commonly known as: CLEOCIN   doxycycline 100 MG capsule Commonly known as: VIBRAMYCIN   hydrOXYzine 25 MG tablet Commonly known as: ATARAX   traZODone 100 MG tablet Commonly known as: DESYREL       TAKE these medications    ARIPiprazole 10 MG tablet Commonly known as:  ABILIFY Take 1 tablet (10 mg total) by mouth daily. What changed: Another medication with the same name was removed. Continue taking this medication, and follow the directions you see here.   divalproex 500 MG 24 hr tablet Commonly known as: DEPAKOTE ER Take 2 tablets (1,000 mg total) by mouth at bedtime.   mirtazapine 15 MG tablet Commonly known as: REMERON Take 1 tablet (15 mg total) by mouth at bedtime. For depression/sleep       ASK your doctor about these medications    acetaminophen 500 MG tablet Commonly known as: TYLENOL Take 1,000 mg by mouth daily as needed for headache. Ask about: Which instructions should I use?   acetaminophen 325 MG tablet Commonly known as: TYLENOL Take 2 tablets (650 mg total) by mouth every 6 (six) hours as needed for mild pain (or Fever >/= 101). Ask about: Which instructions should I use?   sulfamethoxazole-trimethoprim 800-160 MG tablet Commonly known as: BACTRIM DS Take 1 tablet by mouth every 12 (twelve) hours for 4 days. Ask about: Should I take this medication?        Discharge Instructions: Please refer to Patient Instructions section of EMR for full details.  Patient was counseled important signs and symptoms that should prompt return to medical care, changes in medications, dietary instructions, activity restrictions, and follow up appointments.   Follow-Up Appointments:  Follow-up Information     Kerin Perna, NP Follow up on 01/03/2023.   Specialty: Internal Medicine Why: You are scheduled for a hospital follow up with Juluis Mire, NP on January 03, 2023 at 3:50 pm. Contact information: [place] [address] [phone]                 Lowry Ram, MD 12/26/2022, 1:25 PM PGY-1, [place]

## 2022-12-26 NOTE — H&P (Incomplete)
Hospital Admission History and Physical Service Pager: [phone]  Patient name: Cory Floyd Medical record number: [id number] Date of Birth: 04-13-1976 Age: 47 y.o. Gender: male  Primary Care Provider: Pcp, No Consultants: none Code Status: Full. Unable to fully engage in Code status discussion at this time due to depressive thoughts, just says "I'm done with this. I'm ready to die." Agrees to being full code for now and revisiting code status after restarting his psych meds.  Preferred Emergency Contact: Salley Scarlet [phone]   Chief Complaint: R arm pain  Assessment and Plan: Cory Floyd is a 47 y.o. male presenting with R arm pain c/w cellulitis +/- underlying abscess. Differential for presentation of this includes cellulitis given erythema, edema, tenderness in the context of recent IV methamphetamine use. Differential also includes abscess, osteomyelitis, septic arthritis. Less likely abscess given no noted fluctuance. He is predisposed to septic arthritis given IVDU. His vitals have been stable, he is pending Bcx and imaging.   * Cellulitis Recently admitted for L arm cellulitis and presents with erythema and tenderness in the R arm s/p recent IV methamphetamine use concerning for cellulitis in his R arm. LA unremarkable. R elbow xray with generalized soft tissue edema. Rt UE U/S pending. Less concerning for osteomyelitis given absence of systemic symptoms and absence of current radiographic findings. F/u Bcx to rule out hematogenous infection. -admit to FMTS, attending Dr. Thompson Grayer -continue IV vancomycin monotherapy as has significant cephalosporin allergy -PRN tylenol for pain -AM CBC, BMP -f/u Bcx  -If US shows abscess, would need surgical consultation given potential complications of I&D over the antecubial fossa  Bipolar 1 disorder [facility]) Patient with psychiatric history including multiple admissions to [facility] (2020 x3, 2021 x1, 2022 x3) and suicide attempts via overdose  and recent methamphetamine use. 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 Currently with passive SI, paranoias and VH in the setting of medication non-adherence since last admission.  -restart abilify 10 daily, depakote 1000 QHS, and remeron '15mg'$  QHS  -psychiatry consulted, appreciate recs    -f/u UDS    Hyponatremia Na 133 on admission. Asymptomatic. Suspect also in the setting of poor PO intake.  -continue to monitor -AM BMP  AKI (acute kidney injury) ([place]) Cr elevated 1.51, baseline appears to be around 1. Suspect in the setting of poor PO intake. S/p 1L NS bolus in ED.  -AM BMP -encourage hydration  Cough Patient also reports a cough and SOB. He is satting well on RA. Respiratory panel negative, CXR with mild atelectasis in L lung base, unchanged from prior. Differential includes viral infection, GERD.  -continue to monitor   FEN/GI: regular diet  VTE Prophylaxis: lovenox  Disposition: home  History of Present Illness:  Cory Floyd is a 47 y.o. male presenting with R arm pain.   Patient reports that he used methamphetamine 6 days ago after he was discharged from the hospital. He reports severe pain in his R arm and coming to the hospital due to the pain. He reports a history of bipolar disorder but denies taking his psychiatric medications after discharge. He reports difficulty with obtaining Medicaid and follow-up with the ACTT team. He reports paranoia, visual hallucinations of men following him, and suicidal thoughts.  He endorses that his recent meth use was suicide attempt. He shot up a lot more than usual (a full gram) in an attempt to "go out in style." He contracts for safety at this time and  denies suicidal intent or plan. He reports feeling SOB with cough. He denies any chest pain, abdominal pain, N/V, SOB.   Per chart review, patient was admitted for L arm cellulitis 1 week ago from 2/27-2/28. During that admission, patient was  started on vancomycin and transitioned to oral bactrim BID for 4 days, unclear if he was taking this or not. Psychiatry consulted, UDS+cocaine, amphetamine, and he requested discharge. He was psychiatrically cleared and discharged on regimen of abilify 10 daily, depakote 1000 QHS, remeron '15mg'$  QHS.   In the ED, patient presented with erythema and swelling in R arm after IV drug use and increased depression and suicidal thoughts. Labs notable for CBC stable. Na 133, Cr 1.51, LA 1.5 >1.3. Given IV vancomycin x1, NS bolus 1L. Pending Bcx and respiratory panel.   Review Of Systems: Per HPI with the following additions: None  Pertinent Past Medical History: Methamphetamine use disorder Bipolar disorder Fatty liver HTN   Remainder reviewed in history tab.   Pertinent Past Surgical History: None   Remainder reviewed in history tab.   Pertinent Social History: Tobacco use: Yes, 1 pack per day Alcohol use: history of 1/2-5th pint of liquor  Other Substance use: IV methamphetamine, cocaine, marijuana, heroin  Pertinent Family History: None  Remainder reviewed in history tab.   Important Outpatient Medications: Abilify 10 daily Depakote 1000 QHS  Remeron 15 QHS   Remainder reviewed in medication history.   Objective: BP 105/73   Pulse 92   Temp 98.7 F (37.1 C) (Oral)   Resp 19   Ht '5\' 10"'$  (1.778 m)   Wt 106.6 kg   SpO2 100%   BMI 33.72 kg/m  Exam: General: laying on bed, in mild distress due to pain from R arm Cardiovascular: Regular rate and rhythm, no m/r/g Respiratory: CTAB. Normal WOB on RA. Gastrointestinal: obese, soft, non-tender  MSK: R arm erythema in antecubital fossa to mid-forearm (see photo). Significant induration but no obvious fluctuance. Exquisitely tender to touch. L arm minimally erythematous as well.  Neuro: alert and answering questions appropriately.  Psych: Flat affect. Not currently responding to internal stimuli.    Labs:  CBC BMET  Recent  Labs  Lab 12/26/22 1932  WBC 8.3  HGB 12.4*  HCT 37.4*  PLT 210   Recent Labs  Lab 12/26/22 1932  NA 133*  K 3.5  CL 98  CO2 26  BUN 20  CREATININE 1.51*  GLUCOSE 112*  CALCIUM 9.1    Pertinent additional labs lactic acid 1.3 > 1.5.   EKG: sinus tachycardia without ST elevations or depressions.  QTc 436  Imaging Studies Performed: Impression from Radiologist: CXR Mild subsegmental atelectasis or scarring at the left lung base, similar to prior exam. No acute findings.  Rolanda Lundborg, MD 12/27/2022, 12:26 AM PGY-1, [place] Intern pager: [phone], text pages welcome Secure chat group [place]     I have evaluated this patient along with Dr. Lurline Hare and reviewed the above note, making necessary revisions.  Pearla Dubonnet, MD 12/27/2022, 12:32 AM PGY-2, [place]

## 2022-12-26 NOTE — H&P (Incomplete)
Hospital Admission History and Physical Service Pager: [phone]  Patient name: Cory Floyd Medical record number: [id number] Date of Birth: [birth year] Age: 47 y.o. Gender: male  Primary Care Provider: Pcp, No Consultants: none Code Status: FULL which was confirmed with family if patient unable to confirm   Preferred Emergency Contact: Salley Scarlet [phone]   Chief Complaint: R arm pain  Assessment and Plan: TARIS VILLALVA is a 47 y.o. male presenting with R arm pain. Differential for presentation of this includes cellulitis given erythema, edema, tenderness in the context of recent IV methamphetamine use. Differential also includes abscess, osteomyelitis, septic arthritis. Less likely abscess given no noted nodule. He is predisposed to septic arthritis given IVDU. His vitals have been stable, he is pending Bcx and imaging.   No notes have been filed under this hospital service. Service: Family Medicine  FEN/GI: regular diet  VTE Prophylaxis: lovenox  Disposition: home  History of Present Illness:  Cory Floyd is a 47 y.o. male presenting with R arm pain.   Patient reports that he used methamphetamine 6 days ago after he was discharged from the hospital. He reports severe pain in his R arm and coming to the hospital due to the pain. He reports a history of bipolar disorder but denies taking his psychiatric medications after discharge. He reports difficulty with obtaining Medicaid and follow-up with the ACTT team. He reports paranoia and suicidal thoughts. He neither confirms nor denies that his recent meth use was a suicide attempt. He contracts for safety at this time and denies suicidal intent or plan. He reports feeling SOB with cough. He denies any chest pain, abdominal pain, N/V, SOB.   Per chart review, patient was admitted for L arm cellulitis 1 week ago from 2/27-2/28. During that admission, patient was started on vancomycin and transitioned to oral bactrim BID for  4 days. Psychiatry consulted, UDS+cocaine, amphetamine, and he requested discharge. He was psychiatrically cleared and discharged on regimen of abilify 10 daily, depakote 1000 QHS, remeron '15mg'$  QHS.   In the ED, patient presented with erythema and swelling in R arm after IV drug use and increased depression and suicidal thoughts. Labs notable for CBC stable. Na 133, Cr 1.51, LA 1.5 >1.3. Pending Bcx and respiratory panel.   Review Of Systems: Per HPI with the following additions: None  Pertinent Past Medical History: Methamphetamine use disorder Bipolar disorder Fatty liver HTN   Remainder reviewed in history tab.   Pertinent Past Surgical History: None   Remainder reviewed in history tab.   Pertinent Social History: Tobacco use: Yes, 1 pack per day Alcohol use: history of 1/2-5th pint of liquor  Other Substance use: IV methamphetamine, cocaine, marijuana, heroin  Pertinent Family History: None  Remainder reviewed in history tab.   Important Outpatient Medications: Abilify 10 daily Depakote 1000 QHS  Remeron 15 QHS   Remainder reviewed in medication history.   Objective: BP 106/78   Pulse 98   Temp 98.7 F (37.1 C) (Oral)   Resp 15   Ht '5\' 10"'$  (1.778 m)   Wt 106.6 kg   SpO2 100%   BMI 33.72 kg/m  Exam: General: laying on bed, in mild distress due to pain from R arm Cardiovascular: Regular rate and rhythm, no m/r/g Respiratory: CTAB. Normal WOB on RA. Gastrointestinal: obese, soft, non-tender  MSK: R arm erythema in antecubital fossa to mid-forearm (see media tab). L arm minimally erythematous as well.  Neuro: alert and answering questions appropriately.  Psych: Flat affect. Not currently responding to internal stimuli.   Labs:  CBC BMET  Recent Labs  Lab 12/26/22 1932  WBC 8.3  HGB 12.4*  HCT 37.4*  PLT 210   Recent Labs  Lab 12/26/22 1932  NA 133*  K 3.5  CL 98  CO2 26  BUN 20  CREATININE 1.51*  GLUCOSE 112*  CALCIUM 9.1    Pertinent  additional labs lactic acid 1.3 > 1.5.   EKG: pending  Imaging Studies Performed: Impression from Radiologist: CXR Mild subsegmental atelectasis or scarring at the left lung base, similar to prior exam. No acute findings.  Rolanda Lundborg, MD 12/26/2022, 11:45 PM PGY-1, [place] Intern pager: [phone], text pages welcome Secure chat group [place]

## 2022-12-27 ENCOUNTER — Observation Stay (HOSPITAL_COMMUNITY): Payer: Medicare (Managed Care)

## 2022-12-27 DIAGNOSIS — Z608 Other problems related to social environment: Secondary | ICD-10-CM | POA: Diagnosis present

## 2022-12-27 DIAGNOSIS — F419 Anxiety disorder, unspecified: Secondary | ICD-10-CM | POA: Diagnosis present

## 2022-12-27 DIAGNOSIS — Z5982 Transportation insecurity: Secondary | ICD-10-CM | POA: Diagnosis not present

## 2022-12-27 DIAGNOSIS — L03113 Cellulitis of right upper limb: Secondary | ICD-10-CM | POA: Diagnosis present

## 2022-12-27 DIAGNOSIS — Z8616 Personal history of COVID-19: Secondary | ICD-10-CM | POA: Diagnosis not present

## 2022-12-27 DIAGNOSIS — D649 Anemia, unspecified: Secondary | ICD-10-CM | POA: Diagnosis present

## 2022-12-27 DIAGNOSIS — F319 Bipolar disorder, unspecified: Secondary | ICD-10-CM

## 2022-12-27 DIAGNOSIS — K219 Gastro-esophageal reflux disease without esophagitis: Secondary | ICD-10-CM | POA: Diagnosis present

## 2022-12-27 DIAGNOSIS — L03119 Cellulitis of unspecified part of limb: Secondary | ICD-10-CM | POA: Diagnosis not present

## 2022-12-27 DIAGNOSIS — Y838 Other surgical procedures as the cause of abnormal reaction of the patient, or of later complication, without mention of misadventure at the time of the procedure: Secondary | ICD-10-CM | POA: Diagnosis not present

## 2022-12-27 DIAGNOSIS — T50916A Underdosing of multiple unspecified drugs, medicaments and biological substances, initial encounter: Secondary | ICD-10-CM | POA: Diagnosis not present

## 2022-12-27 DIAGNOSIS — E119 Type 2 diabetes mellitus without complications: Secondary | ICD-10-CM | POA: Diagnosis present

## 2022-12-27 DIAGNOSIS — Z881 Allergy status to other antibiotic agents status: Secondary | ICD-10-CM | POA: Diagnosis not present

## 2022-12-27 DIAGNOSIS — M1990 Unspecified osteoarthritis, unspecified site: Secondary | ICD-10-CM | POA: Diagnosis present

## 2022-12-27 DIAGNOSIS — Z1152 Encounter for screening for COVID-19: Secondary | ICD-10-CM | POA: Diagnosis not present

## 2022-12-27 DIAGNOSIS — F17210 Nicotine dependence, cigarettes, uncomplicated: Secondary | ICD-10-CM | POA: Diagnosis present

## 2022-12-27 DIAGNOSIS — Z880 Allergy status to penicillin: Secondary | ICD-10-CM | POA: Diagnosis not present

## 2022-12-27 DIAGNOSIS — N179 Acute kidney failure, unspecified: Secondary | ICD-10-CM

## 2022-12-27 DIAGNOSIS — F1414 Cocaine abuse with cocaine-induced mood disorder: Secondary | ICD-10-CM | POA: Diagnosis present

## 2022-12-27 DIAGNOSIS — Z6833 Body mass index (BMI) 33.0-33.9, adult: Secondary | ICD-10-CM | POA: Diagnosis not present

## 2022-12-27 DIAGNOSIS — Z5900 Homelessness unspecified: Secondary | ICD-10-CM | POA: Diagnosis not present

## 2022-12-27 DIAGNOSIS — E669 Obesity, unspecified: Secondary | ICD-10-CM | POA: Diagnosis present

## 2022-12-27 DIAGNOSIS — R45851 Suicidal ideations: Secondary | ICD-10-CM | POA: Diagnosis present

## 2022-12-27 DIAGNOSIS — L0390 Cellulitis, unspecified: Secondary | ICD-10-CM | POA: Diagnosis present

## 2022-12-27 DIAGNOSIS — E871 Hypo-osmolality and hyponatremia: Secondary | ICD-10-CM | POA: Diagnosis present

## 2022-12-27 DIAGNOSIS — Z79899 Other long term (current) drug therapy: Secondary | ICD-10-CM | POA: Diagnosis not present

## 2022-12-27 DIAGNOSIS — Z87892 Personal history of anaphylaxis: Secondary | ICD-10-CM | POA: Diagnosis not present

## 2022-12-27 DIAGNOSIS — K760 Fatty (change of) liver, not elsewhere classified: Secondary | ICD-10-CM | POA: Diagnosis present

## 2022-12-27 DIAGNOSIS — F1920 Other psychoactive substance dependence, uncomplicated: Secondary | ICD-10-CM

## 2022-12-27 DIAGNOSIS — R062 Wheezing: Secondary | ICD-10-CM | POA: Diagnosis present

## 2022-12-27 DIAGNOSIS — Z8249 Family history of ischemic heart disease and other diseases of the circulatory system: Secondary | ICD-10-CM | POA: Diagnosis not present

## 2022-12-27 DIAGNOSIS — L02414 Cutaneous abscess of left upper limb: Secondary | ICD-10-CM | POA: Diagnosis present

## 2022-12-27 DIAGNOSIS — L03114 Cellulitis of left upper limb: Secondary | ICD-10-CM | POA: Diagnosis present

## 2022-12-27 DIAGNOSIS — R059 Cough, unspecified: Secondary | ICD-10-CM

## 2022-12-27 DIAGNOSIS — T8131XA Disruption of external operation (surgical) wound, not elsewhere classified, initial encounter: Secondary | ICD-10-CM | POA: Diagnosis not present

## 2022-12-27 DIAGNOSIS — I10 Essential (primary) hypertension: Secondary | ICD-10-CM | POA: Diagnosis present

## 2022-12-27 DIAGNOSIS — L02413 Cutaneous abscess of right upper limb: Secondary | ICD-10-CM | POA: Diagnosis present

## 2022-12-27 DIAGNOSIS — Z5941 Food insecurity: Secondary | ICD-10-CM | POA: Diagnosis not present

## 2022-12-27 DIAGNOSIS — F1510 Other stimulant abuse, uncomplicated: Secondary | ICD-10-CM | POA: Diagnosis present

## 2022-12-27 DIAGNOSIS — Z91018 Allergy to other foods: Secondary | ICD-10-CM | POA: Diagnosis not present

## 2022-12-27 DIAGNOSIS — Z8719 Personal history of other diseases of the digestive system: Secondary | ICD-10-CM | POA: Diagnosis not present

## 2022-12-27 DIAGNOSIS — G4700 Insomnia, unspecified: Secondary | ICD-10-CM | POA: Diagnosis present

## 2022-12-27 DIAGNOSIS — F1011 Alcohol abuse, in remission: Secondary | ICD-10-CM | POA: Diagnosis present

## 2022-12-27 DIAGNOSIS — F1590 Other stimulant use, unspecified, uncomplicated: Secondary | ICD-10-CM | POA: Diagnosis present

## 2022-12-27 LAB — RAPID URINE DRUG SCREEN, HOSP PERFORMED
Amphetamines: POSITIVE — AB
Barbiturates: NOT DETECTED
Benzodiazepines: POSITIVE — AB
Cocaine: POSITIVE — AB
Opiates: NOT DETECTED
Tetrahydrocannabinol: NOT DETECTED

## 2022-12-27 LAB — CBC
HCT: 34.6 % — ABNORMAL LOW (ref 39.0–52.0)
Hemoglobin: 11.4 g/dL — ABNORMAL LOW (ref 13.0–17.0)
MCH: 29.9 pg (ref 26.0–34.0)
MCHC: 32.9 g/dL (ref 30.0–36.0)
MCV: 90.8 fL (ref 80.0–100.0)
Platelets: 200 10*3/uL (ref 150–400)
RBC: 3.81 MIL/uL — ABNORMAL LOW (ref 4.22–5.81)
RDW: 13.3 % (ref 11.5–15.5)
WBC: 6 10*3/uL (ref 4.0–10.5)
nRBC: 0 % (ref 0.0–0.2)

## 2022-12-27 LAB — BASIC METABOLIC PANEL
Anion gap: 11 (ref 5–15)
BUN: 17 mg/dL (ref 6–20)
CO2: 23 mmol/L (ref 22–32)
Calcium: 8.7 mg/dL — ABNORMAL LOW (ref 8.9–10.3)
Chloride: 102 mmol/L (ref 98–111)
Creatinine, Ser: 1.01 mg/dL (ref 0.61–1.24)
GFR, Estimated: >60 mL/min (ref >60)
Glucose, Bld: 129 mg/dL — ABNORMAL HIGH (ref 70–99)
Potassium: 3.7 mmol/L (ref 3.5–5.1)
Sodium: 136 mmol/L (ref 135–145)

## 2022-12-27 LAB — HEPATITIS C ANTIBODY: HCV Ab: REACTIVE — AB

## 2022-12-27 LAB — HIV ANTIBODY (ROUTINE TESTING W REFLEX): HIV Screen 4th Generation wRfx: NONREACTIVE

## 2022-12-27 LAB — VALPROIC ACID LEVEL: Valproic Acid Lvl: 27 ug/mL — ABNORMAL LOW (ref 50.0–100.0)

## 2022-12-27 MED ORDER — VANCOMYCIN HCL IN DEXTROSE 1-5 GM/200ML-% IV SOLN
1000.0000 mg | Freq: Two times a day (BID) | INTRAVENOUS | Status: DC
  Administered 2022-12-27 – 2022-12-29 (×5): 1000 mg via INTRAVENOUS
  Filled 2022-12-27 (×5): qty 200

## 2022-12-27 MED ORDER — ORAL CARE MOUTH RINSE: 15.0000 mL | OROMUCOSAL | Status: DC | PRN

## 2022-12-27 NOTE — ED Notes (Signed)
Report received from chelsea RN, assumed care at this time. Pt transferred from room 2 to 44 with sitter.

## 2022-12-27 NOTE — ED Notes (Signed)
Pt up to bathroom.

## 2022-12-27 NOTE — ED Notes (Signed)
This RN called staffing to ask for a suicide sitter that was ordered for this patient, and was informed they do not have a sitter available at this time but will send someone as soon as they do. MD asked this RN to look into other options, this RN consulted ED charge nurse and [facility] for help finding a sitter. AC is actively working with staffing to find a sitter at this time. Patient is currently located near nurses station with door open.

## 2022-12-27 NOTE — ED Notes (Signed)
Pt sleeping. Still coughing. No distress, normal vital signs.

## 2022-12-27 NOTE — Progress Notes (Addendum)
FMTS Interim Progress Note  Spoke with Dr. Lovette Cliche on psychiatry service and clarified safety observation necessity for high risk of self-harm.  Patient is currently amendable to voluntary commitment once medically cleared.  With recent incarceration he is very quickly triggered if he is unable to have his cell phone with him in the room.  Will place one-to-one safety observation sitter orders, patient is allowed to have his mobile device with him in the room.   If patient becomes agitated and wants to leave AMA overnight, night team can IVC and patient can be reevaluated in the morning by psychiatry.   Note to nursing: If patient becomes agitated overnight please call our service pager immediately, we will do our best to de-escalate the situation to prevent IVC.  Service pager [phone]  Darci Current, DO 12/27/2022, 5:17 PM PGY-1, [place] Medicine Service pager [phone]

## 2022-12-27 NOTE — Progress Notes (Addendum)
Daily Progress Note Intern Pager: [phone]  Patient name: Cory Floyd Medical record number: [id number] Date of birth: Jul 02, 1976 Age: 47 y.o. Gender: male  Primary Care Provider: Pcp, No Consultants: None Code Status: Full code  Pt Overview and Major Events to Date:  3/5: Admitted to FMTS  Assessment and Plan: Cory Floyd is a 47 y.o. male presenting with R arm pain c/w cellulitis +/- underlying abscess. PMHX includes polysubstance use and bipolar disorder.  * Cellulitis LE UE shows developing abscess that could possible be drained. RE UE without distinct drainable collection. In the setting of IVDU. BC no growth @ 12 hours. -IV vancomycin monotherapy as has significant cephalosporin allergy -PRN tylenol for pain -F/u Bcx  -Consult Ortho for possible I&D of L AC abscess  Bipolar 1 disorder (HCC) Endorses SI this AM but denies plan. Pending psych eval today. -Restart abilify 10 daily, depakote 1000 QHS, and remeron '15mg'$  QHS  -Psychiatry consulted, appreciate recs -Safety sitter     Polysubstance dependence [facility]) IVDU of methamphetamine, possible sharing of needles. UDS positive for cocaine, amphetamine and benzodiazepines. Patient with strong desire to stop substance use. -HIV, Hep C   FEN/GI: Regular diet PPx: Lovenox Dispo: Home pending abx management  Subjective:  Patient assessed at bedside in the ED. States he feels like he does not want to be here and wants to hurt himself but denies any plan. States he is tried of putting everyone through this. Endorses IVDU, denies sharing needles but states he should probably be checked for HIV and Hep C. Wants to get clean and states he needs help to do it. Only took 1-2 days of antibiotics upon discharge.  Objective: Temp:  [97.9 F (36.6 C)-98.7 F (37.1 C)] 97.9 F (36.6 C) (03/06 0650) Pulse Rate:  [87-121] 87 (03/06 0650) Resp:  [11-26] 16 (03/06 0650) BP: (94-138)/(57-84) 94/72 (03/06 0650) SpO2:  [96 %-100  %] 98 % (03/06 0650) Weight:  [106.6 kg] 106.6 kg (03/05 1837) Physical Exam: General: laying on bed, alert Cardiovascular: Regular rate and rhythm, no m/r/g Respiratory: CTAB. Normal WOB on RA. Gastrointestinal: obese, soft, non-tender  MSK: Bilateral AC erythema, induration and edema. Tender to palpation of R AC.  Psych: Flat affect. Endorses SI without a plan  Laboratory: Most recent CBC Lab Results  Component Value Date   WBC 6.0 12/27/2022   HGB 11.4 (L) 12/27/2022   HCT 34.6 (L) 12/27/2022   MCV 90.8 12/27/2022   PLT 200 12/27/2022   Most recent BMP    Latest Ref Rng & Units 12/27/2022    4:32 AM  BMP  Glucose 70 - 99 mg/dL 129   BUN 6 - 20 mg/dL 17   Creatinine 0.61 - 1.24 mg/dL 1.01   Sodium 135 - 145 mmol/L 136   Potassium 3.5 - 5.1 mmol/L 3.7   Chloride 98 - 111 mmol/L 102   CO2 22 - 32 mmol/L 23   Calcium 8.9 - 10.3 mg/dL 8.7     Other pertinent labs: Glu: 129 UDS: cocaine, benzodiazepines, amphetamines  Imaging/Diagnostic Tests: DG Elbow 2 Views Right Result Date: 12/26/2022 IMPRESSION: Generalized soft tissue edema. No osseous abnormality.  DG Chest Port 1 View Result Date: 12/26/2022 IMPRESSION: Mild subsegmental atelectasis or scarring at the left lung base, similar to prior exam. No acute findings.   Colletta Maryland, MD 12/27/2022, 11:56 AM  PGY-1, [place] Intern pager: [phone], text pages welcome Secure chat group [place]  Service

## 2022-12-27 NOTE — ED Notes (Signed)
RN and sitter attempted to remove pt belongings per policy with order for 1:1 suicide precautions. Pt began to yell at staff stating "You are not taking my belongings". Pt informed that per policy he can not have access to his belongings as long as he is a suicide precaution. Pt then stated he was leaving and took his IV out.   Hospitalits team notified and came down to speak with Pt. Dr. Sabra Heck at bedside. Per Dr. Sabra Heck pt's SI precautions will be DC and he can keep his belongings.

## 2022-12-27 NOTE — ED Notes (Signed)
Pt sleeping.  No distress

## 2022-12-27 NOTE — Plan of Care (Signed)
  Problem: Education: Goal: Knowledge of General Education information will improve Description: Including pain rating scale, medication(s)/side effects and non-pharmacologic comfort measures Outcome: Progressing   Problem: Safety: Goal: Ability to remain free from injury will improve Outcome: Progressing   

## 2022-12-27 NOTE — Plan of Care (Signed)

## 2022-12-27 NOTE — ED Notes (Signed)
Lunch tray at pt bedside.

## 2022-12-27 NOTE — Assessment & Plan Note (Addendum)
Reports anxiety overnight, given Hydroxyzine prn. Concern for withdrawal per RN given tremulousness, started on CIWA protocol. CIWA 2. Denies signs of withdrawal and states tremors likely secondary to Duonebs. -CIWA protocol -F/u Hep C RNA PCR

## 2022-12-27 NOTE — Progress Notes (Signed)
FMTS Interim Progress Note  Received secure chat from RN reporting patient wanted to leave AMA.  Went down to evaluate patient.  He was dressed and getting ready to leave.  He reports being very frustrated with suicide precautions specifically them taking his phone and Games developer.  When evaluated by Dr. Jerilee Hoh this morning patient endorsed passive SI and suicide precautions were placed.    He denies active suicidal ideation, plan, thoughts of hurting himself or others at present.   He was recently released from prison and carries trauma with being confined and isolated.  He is willing to stay and have his arm treated, but will leave AMA if he is unable to have his phone and be able to communicate with people outside of the hospital.  Negotiated with patient to remove suicide precautions since he is not actively suicidal and he agreed to stay and continue evaluation for his arm.   Darci Current, DO 12/27/2022, 1:16 PM PGY-1, [place] [facility] Service pager [phone]

## 2022-12-27 NOTE — Progress Notes (Addendum)
Note: Patient can not sign AMA unless cleared by Psychiatry team.

## 2022-12-27 NOTE — ED Notes (Signed)
Patient wanted OJ. Some was provided.

## 2022-12-27 NOTE — ED Notes (Signed)
Gave patient meal and orange juice.

## 2022-12-27 NOTE — Assessment & Plan Note (Deleted)
Patient also reports a cough and SOB. He is satting well on RA. Respiratory panel negative, CXR with mild atelectasis in L lung base, unchanged from prior. Differential includes viral infection, GERD.  -continue to monitor

## 2022-12-27 NOTE — Consult Note (Signed)
Reason for Consult:Right AC infection Referring Physician: Andrena Mews Time called: [id number] Time at bedside: Ramona is an 47 y.o. male.  HPI: Kiro comes in with a 4-5d hx/o right arm redness, pain, and swelling. He was recently discharged after undergoing IV abx treatment for a left AC infection which improved but did not resolve as he didn't take his OP abx. He injected in the right arm about that time and the pain has been getting worse ever since.  Past Medical History:  Diagnosis Date   Alcoholic ([place])    clean for 3 years   Anginal pain ([place])    Arthritis    Bipolar 1 disorder ([place])    COVID-19    Depressed    Diabetes mellitus without complication ([place])    Dyspnea    Fatty liver    Hypertension    Schizophrenia ([place])     Past Surgical History:  Procedure Laterality Date   COLONOSCOPY  02/05/2014   diverticulosis, hyperplastic polyp x 2   fracture arm Right    KNEE SURGERY Right 2013   NASAL SEPTUM SURGERY     NASAL TURBINATE REDUCTION Bilateral 12/12/2016   Procedure: TURBINATE REDUCTION/SUBMUCOSAL RESECTION;  Surgeon: Beverly Gust, MD;  Location: [facility] ORS;  Service: ENT;  Laterality: Bilateral;    Family History  Problem Relation Age of Onset   Hepatitis C Mother    Cancer Mother    Cancer Maternal Aunt    Hypertension Maternal Grandmother     Social History:  reports that he has been smoking cigarettes. He has a 20.00 pack-year smoking history. He has never used smokeless tobacco. He reports current alcohol use. He reports current drug use. Drugs: Cocaine and Marijuana.  Allergies:  Allergies  Allergen Reactions   Carrot [Daucus [place]ota] Anaphylaxis and Rash   Penicillins Anaphylaxis and Hives    Has patient had a PCN reaction causing immediate rash, facial/tongue/throat swelling, SOB or lightheadedness with hypotension: Yes Has patient had a PCN reaction causing severe rash involving mucus membranes or skin necrosis: No Has patient had a  PCN reaction that required hospitalization Yes Has patient had a PCN reaction occurring within the last 10 years: No If all of the above answers are "NO", then may proceed with Cephalosporin use.    [place]en [Ampicillin] Anaphylaxis and Hives    Medications: I have reviewed the patient's current medications.  Results for orders placed or performed during the hospital encounter of 12/26/22 (from the past 48 hour(s))  CBC with Differential     Status: Abnormal   Collection Time: 12/26/22  7:32 PM  Result Value Ref Range   WBC 8.3 4.0 - 10.5 K/uL   RBC 4.15 (L) 4.22 - 5.81 MIL/uL   Hemoglobin 12.4 (L) 13.0 - 17.0 g/dL   HCT 37.4 (L) 39.0 - 52.0 %   MCV 90.1 80.0 - 100.0 fL   MCH 29.9 26.0 - 34.0 pg   MCHC 33.2 30.0 - 36.0 g/dL   RDW 13.2 11.5 - 15.5 %   Platelets 210 150 - 400 K/uL   nRBC 0.0 0.0 - 0.2 %   Neutrophils Relative % 77 %   Neutro Abs 6.4 1.7 - 7.7 K/uL   Lymphocytes Relative 13 %   Lymphs Abs 1.1 0.7 - 4.0 K/uL   Monocytes Relative 9 %   Monocytes Absolute 0.8 0.1 - 1.0 K/uL   Eosinophils Relative 1 %   Eosinophils Absolute 0.0 0.0 - 0.5 K/uL   Basophils  Relative 0 %   Basophils Absolute 0.0 0.0 - 0.1 K/uL   Immature Granulocytes 0 %   Abs Immature Granulocytes 0.02 0.00 - 0.07 K/uL    Comment: Performed at [facility], [place] [address] [place], [place] [address]  Comprehensive metabolic panel     Status: Abnormal   Collection Time: 12/26/22  7:32 PM  Result Value Ref Range   Sodium 133 (L) 135 - 145 mmol/L   Potassium 3.5 3.5 - 5.1 mmol/L   Chloride 98 98 - 111 mmol/L   CO2 26 22 - 32 mmol/L   Glucose, Bld 112 (H) 70 - 99 mg/dL    Comment: Glucose reference range applies only to samples taken after fasting for at least 8 hours.   BUN 20 6 - 20 mg/dL   Creatinine, Ser 1.51 (H) 0.61 - 1.24 mg/dL   Calcium 9.1 8.9 - 10.3 mg/dL   Total Protein 7.4 6.5 - 8.1 g/dL   Albumin 3.7 3.5 - 5.0 g/dL   AST 29 15 - 41 U/L   ALT 33 0 - 44 U/L   Alkaline  Phosphatase 66 38 - 126 U/L   Total Bilirubin 0.7 0.3 - 1.2 mg/dL   GFR, Estimated 57 (L) >60 mL/min    Comment: (NOTE) Calculated using the CKD-EPI Creatinine Equation (2021)    Anion gap 9 5 - 15    Comment: Performed at [facility] [address] [place], [place] [address]  Lactic acid, plasma     Status: None   Collection Time: 12/26/22  7:32 PM  Result Value Ref Range   Lactic Acid, Venous 1.3 0.5 - 1.9 mmol/L    Comment: Performed at [facility] [address], [place], [place] [address]  Lactic acid, plasma     Status: None   Collection Time: 12/26/22  9:26 PM  Result Value Ref Range   Lactic Acid, Venous 1.5 0.5 - 1.9 mmol/L    Comment: Performed at [facility] [address], [place], [place] [address]  Resp panel by RT-PCR (RSV, Flu A&B, Covid) Anterior Nasal Swab     Status: None   Collection Time: 12/26/22  9:29 PM   Specimen: Anterior Nasal Swab  Result Value Ref Range   SARS Coronavirus 2 by RT PCR NEGATIVE NEGATIVE   Influenza A by PCR NEGATIVE NEGATIVE   Influenza B by PCR NEGATIVE NEGATIVE    Comment: (NOTE) The Xpert Xpress SARS-CoV-2/FLU/RSV plus assay is intended as an aid in the diagnosis of influenza from Nasopharyngeal swab specimens and should not be used as a sole basis for treatment. Nasal washings and aspirates are unacceptable for Xpert Xpress SARS-CoV-2/FLU/RSV testing.  Fact Sheet for Patients: EntrepreneurPulse.com.au  Fact Sheet for Healthcare Providers: IncredibleEmployment.be  This test is not yet approved or cleared by the [place] FDA and has been authorized for detection and/or diagnosis of SARS-CoV-2 by FDA under an Emergency Use Authorization (EUA). This EUA will remain in effect (meaning this test can be used) for the duration of the COVID-19 declaration under Section 564(b)(1) of the Act, 21 U.S.C. section 360bbb-3(b)(1), unless the authorization is terminated  or revoked.     Resp Syncytial Virus by PCR NEGATIVE NEGATIVE    Comment: (NOTE) Fact Sheet for Patients: EntrepreneurPulse.com.au  Fact Sheet for Healthcare Providers: IncredibleEmployment.be  This test is not yet approved or cleared by the [place] FDA and has been authorized for detection and/or diagnosis of SARS-CoV-2 by FDA  under an Emergency Use Authorization (EUA). This EUA will remain in effect (meaning this test can be used) for the duration of the COVID-19 declaration under Section 564(b)(1) of the Act, 21 U.S.C. section 360bbb-3(b)(1), unless the authorization is terminated or revoked.  Performed at [facility], [place] [address] [place], [place] [address]   Blood culture (routine x 2)     Status: None (Preliminary result)   Collection Time: 12/26/22  9:50 PM   Specimen: BLOOD LEFT FOREARM  Result Value Ref Range   Specimen Description BLOOD LEFT FOREARM    Special Requests      BOTTLES DRAWN AEROBIC AND ANAEROBIC Blood Culture adequate volume   Culture      NO GROWTH < 12 HOURS Performed at [facility], [place] [address], [place], [place] [address]    Report Status PENDING   Blood culture (routine x 2)     Status: None (Preliminary result)   Collection Time: 12/26/22  9:58 PM   Specimen: BLOOD LEFT HAND  Result Value Ref Range   Specimen Description BLOOD LEFT HAND    Special Requests      BOTTLES DRAWN AEROBIC AND ANAEROBIC Blood Culture adequate volume   Culture      NO GROWTH < 12 HOURS Performed at [facility], [place] [address] [place], [place] [address]    Report Status PENDING   Rapid urine drug screen (hospital performed)     Status: Abnormal   Collection Time: 12/27/22 12:00 AM  Result Value Ref Range   Opiates NONE DETECTED NONE DETECTED   Cocaine POSITIVE (A) NONE DETECTED   Benzodiazepines POSITIVE (A) NONE DETECTED   Amphetamines POSITIVE (A) NONE DETECTED   Tetrahydrocannabinol NONE  DETECTED NONE DETECTED   Barbiturates NONE DETECTED NONE DETECTED    Comment: (NOTE) DRUG SCREEN FOR MEDICAL PURPOSES ONLY.  IF CONFIRMATION IS NEEDED FOR ANY PURPOSE, NOTIFY LAB WITHIN 5 DAYS.  LOWEST DETECTABLE LIMITS FOR URINE DRUG SCREEN Drug Class                     Cutoff (ng/mL) Amphetamine and metabolites    1000 Barbiturate and metabolites    200 Benzodiazepine                 200 Opiates and metabolites        300 Cocaine and metabolites        300 THC                            50 Performed at [facility], [place] [address] [place], [place] [address]   CBC     Status: Abnormal   Collection Time: 12/27/22  4:32 AM  Result Value Ref Range   WBC 6.0 4.0 - 10.5 K/uL   RBC 3.81 (L) 4.22 - 5.81 MIL/uL   Hemoglobin 11.4 (L) 13.0 - 17.0 g/dL   HCT 34.6 (L) 39.0 - 52.0 %   MCV 90.8 80.0 - 100.0 fL   MCH 29.9 26.0 - 34.0 pg   MCHC 32.9 30.0 - 36.0 g/dL   RDW 13.3 11.5 - 15.5 %   Platelets 200 150 - 400 K/uL   nRBC 0.0 0.0 - 0.2 %    Comment: Performed at [facility], [place] [address], [place],  [id number]  Basic metabolic panel     Status: Abnormal   Collection Time: 12/27/22  4:32  AM  Result Value Ref Range   Sodium 136 135 - 145 mmol/L   Potassium 3.7 3.5 - 5.1 mmol/L   Chloride 102 98 - 111 mmol/L   CO2 23 22 - 32 mmol/L   Glucose, Bld 129 (H) 70 - 99 mg/dL    Comment: Glucose reference range applies only to samples taken after fasting for at least 8 hours.   BUN 17 6 - 20 mg/dL   Creatinine, Ser 1.01 0.61 - 1.24 mg/dL   Calcium 8.7 (L) 8.9 - 10.3 mg/dL   GFR, Estimated >60 >60 mL/min    Comment: (NOTE) Calculated using the CKD-EPI Creatinine Equation (2021)    Anion gap 11 5 - 15    Comment: Performed at [facility] [address], [place], [place] [address]    [place] LT UPPER EXTREM LTD SOFT TISSUE NON VASCULAR  Result Date: 12/27/2022 CLINICAL DATA:  [id number] Cellulitis [id number] EXAM: ULTRASOUND LEFT UPPER EXTREMITY LIMITED TECHNIQUE:  Ultrasound examination of the upper extremity soft tissues was performed in the area of clinical concern. COMPARISON:  None Available. FINDINGS: Targeted ultrasound was performed of the soft tissues of the left upper extremity at site of patient's clinical concern which was in the antecubital fossa region. Marked subcutaneous edema with ill-defined fluid. Prominent echogenic appearance of the subcutaneous fat with extensive hyperemia on color Doppler. Approximately 3 x 1 x 2 cm area of complex echoes within the subcutaneous tissues at this location is suggestive of a phlegmon/developing abscess. IMPRESSION: Cellulitis of the left antecubital fossa with an approximately 3 x 1 x 2 cm phlegmon/developing abscess. Electronically Signed   By: Davina Poke D.O.   On: 12/27/2022 09:18   [place] RT UPPER EXTREM LTD SOFT TISSUE NON VASCULAR  Result Date: 12/27/2022 CLINICAL DATA:  Cellulitis EXAM: ULTRASOUND RIGHT UPPER EXTREMITY LIMITED TECHNIQUE: Ultrasound examination of the upper extremity soft tissues was performed in the area of clinical concern. COMPARISON:  Right elbow radiographs 12/26/2022 FINDINGS: There is ill-defined edema within the soft tissue in the area of the patient's concern in the distal upper arm and proximal forearm. No drainable fluid collection, soft tissue mass or abnormal vascularity is identified. IMPRESSION: Ill-defined edema within the soft tissue in the area of the patient's concern in the distal upper arm and proximal forearm. No drainable fluid collection. Electronically Signed   By: Richardean Sale M.D.   On: 12/27/2022 07:59   DG Elbow 2 Views Right  Result Date: 12/26/2022 CLINICAL DATA:  Cellulitis. EXAM: RIGHT ELBOW - 2 VIEW COMPARISON:  None Available. FINDINGS: There is no evidence of fracture, dislocation, or joint effusion. No erosive change, periostitis or bony destruction. There is no evidence of arthropathy or other focal bone abnormality. Generalized soft tissue edema. No  soft tissue gas. No radiopaque foreign body. IMPRESSION: Generalized soft tissue edema. No osseous abnormality. Electronically Signed   By: Keith Rake M.D.   On: 12/26/2022 23:52   DG Chest Port 1 View  Result Date: 12/26/2022 CLINICAL DATA:  Cough and shortness of breath. EXAM: PORTABLE CHEST 1 VIEW COMPARISON:  12/19/2022 FINDINGS: The cardiomediastinal contours are normal. Mild subsegmental atelectasis or scarring at the left lung base, similar to prior. Pulmonary vasculature is normal. No consolidation, pleural effusion, or pneumothorax. No acute osseous abnormalities are seen. IMPRESSION: Mild subsegmental atelectasis or scarring at the left lung base, similar to prior exam. No acute findings. Electronically Signed   By: Keith Rake M.D.   On: 12/26/2022 21:53  Review of Systems  Constitutional:  Negative for chills, diaphoresis and fever.  HENT:  Negative for ear discharge, ear pain, hearing loss and tinnitus.   Eyes:  Negative for photophobia and pain.  Respiratory:  Negative for cough and shortness of breath.   Cardiovascular:  Negative for chest pain.  Gastrointestinal:  Negative for abdominal pain, nausea and vomiting.  Genitourinary:  Negative for dysuria, flank pain, frequency and urgency.  Musculoskeletal:  Positive for arthralgias (Bilateral elbows). Negative for back pain, myalgias and neck pain.  Neurological:  Negative for dizziness and headaches.  Hematological:  Does not bruise/bleed easily.  Psychiatric/Behavioral:  The patient is not nervous/anxious.    Blood pressure 94/72, pulse 87, temperature 97.9 F (36.6 C), temperature source Oral, resp. rate 16, height '5\' 10"'$  (1.778 m), weight 106.6 kg, SpO2 98 %. Physical Exam Constitutional:      General: He is not in acute distress.    Appearance: He is well-developed. He is not diaphoretic.  HENT:     Head: Normocephalic and atraumatic.  Eyes:     General: No scleral icterus.       Right eye: No discharge.         Left eye: No discharge.     Conjunctiva/sclera: Conjunctivae normal.  Cardiovascular:     Rate and Rhythm: Normal rate and regular rhythm.  Pulmonary:     Effort: Pulmonary effort is normal. No respiratory distress.  Musculoskeletal:     Cervical back: Normal range of motion.     Comments: Bilateral shoulder, elbow, wrist, digits- Puncture wounds bilateral AC fossa, erythema and induration R>L, mod TTP, no fluctuance, able to range >90 degrees, no instability, no blocks to motion  Sens  Ax/R/M/U intact  Mot   Ax/ R/ PIN/ M/ AIN/ U intact  Rad 2+  Skin:    General: Skin is warm and dry.  Neurological:     Mental Status: He is alert.  Psychiatric:        Mood and Affect: Mood normal.        Behavior: Behavior normal.     Assessment/Plan: Bilateral UE infections -- No clear surgical indication at this time. Recommend IV abx treatment for next 36h. Will make NPO after MN tomorrow night and reassess Friday morning.    Lisette Abu, [name] Orthopedic Surgery [phone] 12/27/2022, 12:34 PM
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
## 2022-12-27 NOTE — ED Notes (Signed)
.ED TO INPATIENT HANDOFF REPORT  ED Nurse Name and Phone #: [phone]  S Name/Age/Gender Cory Floyd 47 y.o. male Room/Bed: 039C/039C  Code Status   Code Status: Full Code  Home/SNF/Other Home Patient oriented to: self, place, time, and situation Is this baseline? Yes   Triage Complete: Triage complete  Chief Complaint Cellulitis [id number]  Triage Note Pt has an abscess approx the size of a golf ball on left AC area. The abscess is erythematous. Pt has an abscess on the right inner arm near the [facility] area. The inner arm is erythematous and the redness is starting to spread down the armx2d. Pt has a dry cough.    Allergies Allergies  Allergen Reactions   Carrot [Daucus Carota] Anaphylaxis and Rash   Penicillins Anaphylaxis and Hives    Has patient had a PCN reaction causing immediate rash, facial/tongue/throat swelling, SOB or lightheadedness with hypotension: Yes Has patient had a PCN reaction causing severe rash involving mucus membranes or skin necrosis: No Has patient had a PCN reaction that required hospitalization Yes Has patient had a PCN reaction occurring within the last 10 years: No If all of the above answers are "NO", then may proceed with Cephalosporin use.    Principen [Ampicillin] Anaphylaxis and Hives    Level of Care/Admitting Diagnosis ED Disposition     ED Disposition  Admit   Condition  --   Comment  Hospital Area: [facility] [id number]  Level of Care: Med-Surg [16]  May admit patient to [facility] or [facility] if equivalent level of care is available:: No  Covid Evaluation: Confirmed COVID Negative  Diagnosis: Cellulitis [id number]  Admitting Physician: Kinnie Feil [[id number]]  Attending Physician: Kinnie Feil [id number]  Certification:: I certify this patient will need inpatient services for at least 2 midnights          B Medical/Surgery History Past Medical History:  Diagnosis Date   Alcoholic ([place])    clean for 3  years   Anginal pain ([place])    Arthritis    Bipolar 1 disorder ([place])    COVID-19    Depressed    Diabetes mellitus without complication ([place])    Dyspnea    Fatty liver    Hypertension    Schizophrenia ([place])    Past Surgical History:  Procedure Laterality Date   COLONOSCOPY  02/05/2014   diverticulosis, hyperplastic polyp x 2   fracture arm Right    KNEE SURGERY Right 2013   NASAL SEPTUM SURGERY     NASAL TURBINATE REDUCTION Bilateral 12/12/2016   Procedure: TURBINATE REDUCTION/SUBMUCOSAL RESECTION;  Surgeon: Beverly Gust, MD;  Location: [facility] ORS;  Service: ENT;  Laterality: Bilateral;     A IV Location/Drains/Wounds Patient Lines/Drains/Airways Status     Active Line/Drains/Airways     Name Placement date Placement time Site Days   Peripheral IV 12/26/22 20 G Anterior;Left;Proximal Forearm 12/26/22  2150  Forearm  1            Intake/Output Last 24 hours No intake or output data in the 24 hours ending 12/27/22 1140  Labs/Imaging Results for orders placed or performed during the hospital encounter of 12/26/22 (from the past 48 hour(s))  CBC with Differential     Status: Abnormal   Collection Time: 12/26/22  7:32 PM  Result Value Ref Range   WBC 8.3 4.0 - 10.5 K/uL   RBC 4.15 (L) 4.22 - 5.81 MIL/uL   Hemoglobin 12.4 (L) 13.0 - 17.0 g/dL  HCT 37.4 (L) 39.0 - 52.0 %   MCV 90.1 80.0 - 100.0 fL   MCH 29.9 26.0 - 34.0 pg   MCHC 33.2 30.0 - 36.0 g/dL   RDW 13.2 11.5 - 15.5 %   Platelets 210 150 - 400 K/uL   nRBC 0.0 0.0 - 0.2 %   Neutrophils Relative % 77 %   Neutro Abs 6.4 1.7 - 7.7 K/uL   Lymphocytes Relative 13 %   Lymphs Abs 1.1 0.7 - 4.0 K/uL   Monocytes Relative 9 %   Monocytes Absolute 0.8 0.1 - 1.0 K/uL   Eosinophils Relative 1 %   Eosinophils Absolute 0.0 0.0 - 0.5 K/uL   Basophils Relative 0 %   Basophils Absolute 0.0 0.0 - 0.1 K/uL   Immature Granulocytes 0 %   Abs Immature Granulocytes 0.02 0.00 - 0.07 K/uL    Comment: Performed at [facility] [address] [place], [place] [address]  Comprehensive metabolic panel     Status: Abnormal   Collection Time: 12/26/22  7:32 PM  Result Value Ref Range   Sodium 133 (L) 135 - 145 mmol/L   Potassium 3.5 3.5 - 5.1 mmol/L   Chloride 98 98 - 111 mmol/L   CO2 26 22 - 32 mmol/L   Glucose, Bld 112 (H) 70 - 99 mg/dL    Comment: Glucose reference range applies only to samples taken after fasting for at least 8 hours.   BUN 20 6 - 20 mg/dL   Creatinine, Ser 1.51 (H) 0.61 - 1.24 mg/dL   Calcium 9.1 8.9 - 10.3 mg/dL   Total Protein 7.4 6.5 - 8.1 g/dL   Albumin 3.7 3.5 - 5.0 g/dL   AST 29 15 - 41 U/L   ALT 33 0 - 44 U/L   Alkaline Phosphatase 66 38 - 126 U/L   Total Bilirubin 0.7 0.3 - 1.2 mg/dL   GFR, Estimated 57 (L) >60 mL/min    Comment: (NOTE) Calculated using the CKD-EPI Creatinine Equation (2021)    Anion gap 9 5 - 15    Comment: Performed at [facility] [address] [place], [place] [address]  Lactic acid, plasma     Status: None   Collection Time: 12/26/22  7:32 PM  Result Value Ref Range   Lactic Acid, Venous 1.3 0.5 - 1.9 mmol/L    Comment: Performed at [facility] [address], [place], [place] [address]  Lactic acid, plasma     Status: None   Collection Time: 12/26/22  9:26 PM  Result Value Ref Range   Lactic Acid, Venous 1.5 0.5 - 1.9 mmol/L    Comment: Performed at [facility] [address], [place], [place] [address]  Resp panel by RT-PCR (RSV, Flu A&B, Covid) Anterior Nasal Swab     Status: None   Collection Time: 12/26/22  9:29 PM   Specimen: Anterior Nasal Swab  Result Value Ref Range   SARS Coronavirus 2 by RT PCR NEGATIVE NEGATIVE   Influenza A by PCR NEGATIVE NEGATIVE   Influenza B by PCR NEGATIVE NEGATIVE    Comment: (NOTE) The Xpert Xpress SARS-CoV-2/FLU/RSV plus assay is intended as an aid in the diagnosis of influenza from Nasopharyngeal swab specimens and should not be used as a sole basis for treatment. Nasal  washings and aspirates are unacceptable for Xpert Xpress SARS-CoV-2/FLU/RSV testing.  Fact Sheet for Patients: EntrepreneurPulse.com.au  Fact Sheet for Healthcare Providers: IncredibleEmployment.be  This test  is not yet approved or cleared by the [place] and has been authorized for detection and/or diagnosis of SARS-CoV-2 by FDA under an Emergency Use Authorization (EUA). This EUA will remain in effect (meaning this test can be used) for the duration of the COVID-19 declaration under Section 564(b)(1) of the Act, 21 U.S.C. section 360bbb-3(b)(1), unless the authorization is terminated or revoked.     Resp Syncytial Virus by PCR NEGATIVE NEGATIVE    Comment: (NOTE) Fact Sheet for Patients: EntrepreneurPulse.com.au  Fact Sheet for Healthcare Providers: IncredibleEmployment.be  This test is not yet approved or cleared by the [place] FDA and has been authorized for detection and/or diagnosis of SARS-CoV-2 by FDA under an Emergency Use Authorization (EUA). This EUA will remain in effect (meaning this test can be used) for the duration of the COVID-19 declaration under Section 564(b)(1) of the Act, 21 U.S.C. section 360bbb-3(b)(1), unless the authorization is terminated or revoked.  Performed at [facility], [place] [address], [place], [place] [address]   Blood culture (routine x 2)     Status: None (Preliminary result)   Collection Time: 12/26/22  9:50 PM   Specimen: BLOOD LEFT FOREARM  Result Value Ref Range   Specimen Description BLOOD LEFT FOREARM    Special Requests      BOTTLES DRAWN AEROBIC AND ANAEROBIC Blood Culture adequate volume   Culture      NO GROWTH < 12 HOURS Performed at [facility], [place] [address] [place], [place] [address]    Report Status PENDING   Blood culture (routine x 2)     Status: None (Preliminary result)   Collection Time: 12/26/22  9:58 PM    Specimen: BLOOD LEFT HAND  Result Value Ref Range   Specimen Description BLOOD LEFT HAND    Special Requests      BOTTLES DRAWN AEROBIC AND ANAEROBIC Blood Culture adequate volume   Culture      NO GROWTH < 12 HOURS Performed at [facility], [place] [address], [place], [place] [address]    Report Status PENDING   Rapid urine drug screen (hospital performed)     Status: Abnormal   Collection Time: 12/27/22 12:00 AM  Result Value Ref Range   Opiates NONE DETECTED NONE DETECTED   Cocaine POSITIVE (A) NONE DETECTED   Benzodiazepines POSITIVE (A) NONE DETECTED   Amphetamines POSITIVE (A) NONE DETECTED   Tetrahydrocannabinol NONE DETECTED NONE DETECTED   Barbiturates NONE DETECTED NONE DETECTED    Comment: (NOTE) DRUG SCREEN FOR MEDICAL PURPOSES ONLY.  IF CONFIRMATION IS NEEDED FOR ANY PURPOSE, NOTIFY LAB WITHIN 5 DAYS.  LOWEST DETECTABLE LIMITS FOR URINE DRUG SCREEN Drug Class                     Cutoff (ng/mL) Amphetamine and metabolites    1000 Barbiturate and metabolites    200 Benzodiazepine                 200 Opiates and metabolites        300 Cocaine and metabolites        300 THC                            50 Performed at [facility], [place] [address], [place], [place] [address]   CBC     Status: Abnormal   Collection Time: 12/27/22  4:32 AM  Result Value  Ref Range   WBC 6.0 4.0 - 10.5 K/uL   RBC 3.81 (L) 4.22 - 5.81 MIL/uL   Hemoglobin 11.4 (L) 13.0 - 17.0 g/dL   HCT 34.6 (L) 39.0 - 52.0 %   MCV 90.8 80.0 - 100.0 fL   MCH 29.9 26.0 - 34.0 pg   MCHC 32.9 30.0 - 36.0 g/dL   RDW 13.3 11.5 - 15.5 %   Platelets 200 150 - 400 K/uL   nRBC 0.0 0.0 - 0.2 %    Comment: Performed at [facility], [place] [address] [place], [place] [id number]  Basic metabolic panel     Status: Abnormal   Collection Time: 12/27/22  4:32 AM  Result Value Ref Range   Sodium 136 135 - 145 mmol/L   Potassium 3.7 3.5 - 5.1 mmol/L   Chloride 102 98 - 111 mmol/L   CO2 23 22 - 32  mmol/L   Glucose, Bld 129 (H) 70 - 99 mg/dL    Comment: Glucose reference range applies only to samples taken after fasting for at least 8 hours.   BUN 17 6 - 20 mg/dL   Creatinine, Ser 1.01 0.61 - 1.24 mg/dL   Calcium 8.7 (L) 8.9 - 10.3 mg/dL   GFR, Estimated >60 >60 mL/min    Comment: (NOTE) Calculated using the CKD-EPI Creatinine Equation (2021)    Anion gap 11 5 - 15    Comment: Performed at [facility] [address], [place], [place] [address]   [place] LT UPPER EXTREM LTD SOFT TISSUE NON VASCULAR  Result Date: 12/27/2022 CLINICAL DATA:  [id number] Cellulitis [id number] EXAM: ULTRASOUND LEFT UPPER EXTREMITY LIMITED TECHNIQUE: Ultrasound examination of the upper extremity soft tissues was performed in the area of clinical concern. COMPARISON:  None Available. FINDINGS: Targeted ultrasound was performed of the soft tissues of the left upper extremity at site of patient's clinical concern which was in the antecubital fossa region. Marked subcutaneous edema with ill-defined fluid. Prominent echogenic appearance of the subcutaneous fat with extensive hyperemia on color Doppler. Approximately 3 x 1 x 2 cm area of complex echoes within the subcutaneous tissues at this location is suggestive of a phlegmon/developing abscess. IMPRESSION: Cellulitis of the left antecubital fossa with an approximately 3 x 1 x 2 cm phlegmon/developing abscess. Electronically Signed   By: Davina Poke D.O.   On: 12/27/2022 09:18   [place] RT UPPER EXTREM LTD SOFT TISSUE NON VASCULAR  Result Date: 12/27/2022 CLINICAL DATA:  Cellulitis EXAM: ULTRASOUND RIGHT UPPER EXTREMITY LIMITED TECHNIQUE: Ultrasound examination of the upper extremity soft tissues was performed in the area of clinical concern. COMPARISON:  Right elbow radiographs 12/26/2022 FINDINGS: There is ill-defined edema within the soft tissue in the area of the patient's concern in the distal upper arm and proximal forearm. No drainable fluid collection, soft tissue mass  or abnormal vascularity is identified. IMPRESSION: Ill-defined edema within the soft tissue in the area of the patient's concern in the distal upper arm and proximal forearm. No drainable fluid collection. Electronically Signed   By: Richardean Sale M.D.   On: 12/27/2022 07:59   DG Elbow 2 Views Right  Result Date: 12/26/2022 CLINICAL DATA:  Cellulitis. EXAM: RIGHT ELBOW - 2 VIEW COMPARISON:  None Available. FINDINGS: There is no evidence of fracture, dislocation, or joint effusion. No erosive change, periostitis or bony destruction. There is no evidence of arthropathy or other focal bone abnormality. Generalized soft tissue edema. No soft tissue gas. No radiopaque foreign body. IMPRESSION:  Generalized soft tissue edema. No osseous abnormality. Electronically Signed   By: Keith Rake M.D.   On: 12/26/2022 23:52   DG Chest Port 1 View  Result Date: 12/26/2022 CLINICAL DATA:  Cough and shortness of breath. EXAM: PORTABLE CHEST 1 VIEW COMPARISON:  12/19/2022 FINDINGS: The cardiomediastinal contours are normal. Mild subsegmental atelectasis or scarring at the left lung base, similar to prior. Pulmonary vasculature is normal. No consolidation, pleural effusion, or pneumothorax. No acute osseous abnormalities are seen. IMPRESSION: Mild subsegmental atelectasis or scarring at the left lung base, similar to prior exam. No acute findings. Electronically Signed   By: Keith Rake M.D.   On: 12/26/2022 21:53    Pending Labs Unresulted Labs (From admission, onward)     Start     Ordered   12/28/22 0500  CBC  Tomorrow morning,   R        12/27/22 1028   12/28/22 [id number]  Basic metabolic panel  Tomorrow morning,   R        12/27/22 1028   12/27/22 1006  Valproic acid level  Add-on,   AD       Comments: Surruptitious overdose    12/27/22 1005   12/27/22 0949  Hepatitis C antibody  Once,   R        12/27/22 0948   12/27/22 0949  HIV Antibody (routine testing w rflx)  (HIV Antibody (Routine testing w  reflex) panel)  Once,   R        12/27/22 0948            Vitals/Pain Today's Vitals   12/27/22 0245 12/27/22 0430 12/27/22 0500 12/27/22 0650  BP: 113/61 109/66 125/84 94/72  Pulse: 96  95 87  Resp: 11 (!) '26 18 16  '$ Temp:    97.9 F (36.6 C)  TempSrc:    Oral  SpO2: 100%  98% 98%  Weight:      Height:      PainSc:        Isolation Precautions Airborne and Contact precautions  Medications Medications  ARIPiprazole (ABILIFY) tablet 10 mg (has no administration in time range)  mirtazapine (REMERON) tablet 15 mg (15 mg Oral Given 12/26/22 2329)  divalproex (DEPAKOTE ER) 24 hr tablet 1,000 mg (1,000 mg Oral Given 12/26/22 2329)  acetaminophen (TYLENOL) tablet 1,000 mg (has no administration in time range)  enoxaparin (LOVENOX) injection 40 mg (has no administration in time range)  multivitamin with minerals tablet 1 tablet (has no administration in time range)  vancomycin (VANCOCIN) IVPB 1000 mg/200 mL premix (has no administration in time range)  sodium chloride 0.9 % bolus 1,000 mL (0 mLs Intravenous Stopped 12/26/22 2314)  vancomycin (VANCOREADY) IVPB 2000 mg/400 mL (0 mg Intravenous Stopped 12/27/22 0037)    Mobility walks     Focused Assessments Neuro Assessment Handoff:  Swallow screen pass? Yes          Neuro Assessment: Within Defined Limits Neuro Checks:      Has TPA been given? No If patient is a Neuro Trauma and patient is going to OR before floor call report to [facility] nurse: [phone] or [phone]   R Recommendations: See Admitting Provider Note  Report given to:   Additional Notes:  suicide precautions no IVC order yet

## 2022-12-27 NOTE — Progress Notes (Signed)
No telemetry for this patient per Dr Adah [facility] family [facility].

## 2022-12-27 NOTE — Assessment & Plan Note (Deleted)
Now improved to baseline. Continue to monitor.

## 2022-12-27 NOTE — Assessment & Plan Note (Addendum)
Mild clinical improvement in bilateral AC cellulitis with increasing purulent drainage from R AC. Ortho taking patient to OR this AM for I&D. BC negative @ 3 days. Consult ID for potential Oritavancin use given non-compliance with prior oral abx. -Ortho consulted, I&D today -IV vancomycin -Transition long acting IV vs oral abx pending ID consult -PRN tylenol for pain -F/u Bcx

## 2022-12-27 NOTE — Assessment & Plan Note (Deleted)
Na 133 on admission. Asymptomatic. Suspect also in the setting of poor PO intake.  -continue to monitor -AM BMP

## 2022-12-27 NOTE — Progress Notes (Signed)
Pharmacy Antibiotic Note  Cory Floyd is a 47 y.o. male for which pharmacy has been consulted for vancomycin dosing for cellulitis.  Patient with a history of methamphetamine use. Admitted for L arm cellulitis x1 week ago, disharged on Bactrim. Patient now presenting with redness and swelling to his right arm.  SCr back to baseline at 1.  Will adjust Vancomycin dosing. WBC 8.3; LA 1.3; T 98.3; HR 121; RR 20  Plan: Change Vancomycin to 1gm IV q12h (eAUC 458) SCr 1, Vd 0.5  Trend WBC, Fever, Renal function F/u cultures, clinical course, WBC De-escalate when able  Height: '5\' 10"'$  (177.8 cm) Weight: 106.6 kg (235 lb 0.2 oz) IBW/kg (Calculated) : 73  Temp (24hrs), Avg:98.3 F (36.8 C), Min:97.9 F (36.6 C), Max:98.7 F (37.1 C)  Recent Labs  Lab 12/26/22 1932 12/26/22 2126 12/27/22 0432  WBC 8.3  --  6.0  CREATININE 1.51*  --  1.01  LATICACIDVEN 1.3 1.5  --      Estimated Creatinine Clearance: 109.3 mL/min (by C-G formula based on SCr of 1.01 mg/dL).    Allergies  Allergen Reactions   Carrot [Daucus Carota] Anaphylaxis and Rash   Penicillins Anaphylaxis and Hives    Has patient had a PCN reaction causing immediate rash, facial/tongue/throat swelling, SOB or lightheadedness with hypotension: Yes Has patient had a PCN reaction causing severe rash involving mucus membranes or skin necrosis: No Has patient had a PCN reaction that required hospitalization Yes Has patient had a PCN reaction occurring within the last 10 years: No If all of the above answers are "NO", then may proceed with Cephalosporin use.    Principen [Ampicillin] Anaphylaxis and Hives   Microbiology results: 3/5 BCx: pending  Thank you for allowing pharmacy to be a part of this patient's care.  [employer], Pharm.D., BCPS Clinical Pharmacist Clinical phone for 12/27/2022 from 7:30-3:00 is [phone].  **Pharmacist phone directory can be found on [email or web address] listed under [place].  12/27/2022 8:21  AM

## 2022-12-28 DIAGNOSIS — L03113 Cellulitis of right upper limb: Secondary | ICD-10-CM

## 2022-12-28 DIAGNOSIS — L02414 Cutaneous abscess of left upper limb: Secondary | ICD-10-CM

## 2022-12-28 LAB — CBC
HCT: 36 % — ABNORMAL LOW (ref 39.0–52.0)
Hemoglobin: 11.6 g/dL — ABNORMAL LOW (ref 13.0–17.0)
MCH: 29.1 pg (ref 26.0–34.0)
MCHC: 32.2 g/dL (ref 30.0–36.0)
MCV: 90.2 fL (ref 80.0–100.0)
Platelets: 213 10*3/uL (ref 150–400)
RBC: 3.99 MIL/uL — ABNORMAL LOW (ref 4.22–5.81)
RDW: 13.2 % (ref 11.5–15.5)
WBC: 6.8 10*3/uL (ref 4.0–10.5)
nRBC: 0 % (ref 0.0–0.2)

## 2022-12-28 LAB — BASIC METABOLIC PANEL
Anion gap: 12 (ref 5–15)
BUN: 10 mg/dL (ref 6–20)
CO2: 23 mmol/L (ref 22–32)
Calcium: 8.6 mg/dL — ABNORMAL LOW (ref 8.9–10.3)
Chloride: 101 mmol/L (ref 98–111)
Creatinine, Ser: 0.73 mg/dL (ref 0.61–1.24)
GFR, Estimated: >60 mL/min (ref >60)
Glucose, Bld: 97 mg/dL (ref 70–99)
Potassium: 3.9 mmol/L (ref 3.5–5.1)
Sodium: 136 mmol/L (ref 135–145)

## 2022-12-28 LAB — GLUCOSE, CAPILLARY: Glucose-Capillary: 149 mg/dL — ABNORMAL HIGH (ref 70–99)

## 2022-12-28 MED ORDER — FLUTICASONE PROPIONATE 50 MCG/ACT NA SUSP
1.0000 | Freq: Every day | NASAL | Status: DC
  Administered 2022-12-30: 1 via NASAL
  Filled 2022-12-28: qty 16

## 2022-12-28 MED ORDER — HYDROXYZINE HCL 25 MG PO TABS
25.0000 mg | ORAL_TABLET | Freq: Once | ORAL | Status: AC | PRN
  Administered 2022-12-28: 25 mg via ORAL
  Filled 2022-12-28: qty 1

## 2022-12-28 MED ORDER — IPRATROPIUM-ALBUTEROL 0.5-2.5 (3) MG/3ML IN SOLN
3.0000 mL | Freq: Once | RESPIRATORY_TRACT | Status: AC
  Administered 2022-12-28: 3 mL via RESPIRATORY_TRACT
  Filled 2022-12-28: qty 3

## 2022-12-28 MED ORDER — BENZONATATE 100 MG PO CAPS
100.0000 mg | ORAL_CAPSULE | Freq: Three times a day (TID) | ORAL | Status: DC | PRN
  Administered 2022-12-28 – 2022-12-29 (×3): 100 mg via ORAL
  Filled 2022-12-28 (×3): qty 1

## 2022-12-28 MED ORDER — IPRATROPIUM-ALBUTEROL 0.5-2.5 (3) MG/3ML IN SOLN: 3.0000 mL | Freq: Four times a day (QID) | RESPIRATORY_TRACT | Status: DC | PRN

## 2022-12-28 MED ORDER — IPRATROPIUM-ALBUTEROL 0.5-2.5 (3) MG/3ML IN SOLN
3.0000 mL | Freq: Two times a day (BID) | RESPIRATORY_TRACT | Status: DC
  Administered 2022-12-28: 3 mL via RESPIRATORY_TRACT
  Filled 2022-12-28: qty 3

## 2022-12-28 NOTE — Progress Notes (Signed)
     Daily Progress Note Intern Pager: [phone]  Patient name: Cory Floyd Medical record number: [id number] Date of birth: 1976-08-01 Age: 47 y.o. Gender: male  Primary Care Provider: Pcp, No Consultants: None Code Status: Full code   Pt Overview and Major Events to Date:  3/5: Admitted to FMTS   Assessment and Plan: Cory Floyd is a 47 y.o. male presenting with R arm pain c/w cellulitis +/- underlying abscess. PMHX includes polysubstance use and bipolar disorder.  * Cellulitis Erythema and edema unchanged from previous day. R AC now with purulent drainage. Per Ortho, consider drainage of L AC, will reevaluate 3/8 after IV antibiotics. BC no growth @ 2 days -Ortho consulted, possible I&D pending reevaluation on 3/8 -IV vancomycin monotherapy as has significant cephalosporin allergy -PRN tylenol for pain -F/u Bcx   Bipolar 1 disorder [facility]) Per Psychiatry, recommend inpatient psychiatric management once medically cleared. Patient attempted to leave AMA, but decided to stay. Recommend psychiatric evaluation if attempts to leave AMA again. Denies SI or plan upon exam today. 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 HIV negative. Previously counseled on safety IVDU. -F/u Hep C reflex   FEN/GI: Regular diet, NPO @ midnight tonight for potential procedure 3/8 PPx: Lovenox Dispo: Home pending abx management  Subjective:  Patient assessed at bedside, sitter present. States he has some discomfort in both his wounds and the right arm wound is now draining. Endorses cough, states he smokes 1-2 cigarettes daily currently but used to smoke more. States his mood has improved and he would "think about" inpatient psych treatment. Denies SI or plan today.  Objective: Temp:  [97.9 F (36.6 C)-98 F (36.7 C)] 97.9 F  (36.6 C) (03/07 0823) Pulse Rate:  [90-100] 100 (03/07 0823) Resp:  [14-18] 16 (03/07 0823) BP: (100-147)/(65-89) 100/65 (03/07 0823) SpO2:  [93 %-100 %] 94 % (03/07 0823) Physical Exam: General: laying on bed, alert Cardiovascular: RRR, no murmur Respiratory: Normal WOB on RA. Intermittent cough. Mild expiratory wheeze diffusely Gastrointestinal: obese, soft, non-tender  MSK: Bilateral AC with erythema, induration and edema. Tender to palpation bilaterally. R AC with mild purulent drainage. Psych: Cooperative. Denies SI or active plan.  Laboratory: Most recent CBC Lab Results  Component Value Date   WBC 6.8 12/28/2022   HGB 11.6 (L) 12/28/2022   HCT 36.0 (L) 12/28/2022   MCV 90.2 12/28/2022   PLT 213 12/28/2022   Most recent BMP    Latest Ref Rng & Units 12/28/2022    6:19 AM  BMP  Glucose 70 - 99 mg/dL 97   BUN 6 - 20 mg/dL 10   Creatinine 0.61 - 1.24 mg/dL 0.73   Sodium 135 - 145 mmol/L 136   Potassium 3.5 - 5.1 mmol/L 3.9   Chloride 98 - 111 mmol/L 101   CO2 22 - 32 mmol/L 23   Calcium 8.9 - 10.3 mg/dL 8.6     Other pertinent labs: BC no growth  Imaging/Diagnostic Tests: [place] LT UPPER EXTREM LTD SOFT TISSUE NON VASCULAR Result Date: 12/27/2022 IMPRESSION: Cellulitis of the left antecubital fossa with an approximately 3 x 1 x 2 cm phlegmon/developing abscess.   Colletta Maryland, MD 12/28/2022, 9:43 AM  PGY-1, [place] Intern pager: [phone], text pages welcome Secure chat group [facility]

## 2022-12-28 NOTE — Progress Notes (Signed)
Consulted IV team for PIV insertion.PIV inserted at the left anterior forearm using ultrasound.Both forearm and arm is hurting from infiltration and cellulitis.Spoke to RN that patient needs central line.RN to informed MD.
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
## 2022-12-29 ENCOUNTER — Inpatient Hospital Stay (HOSPITAL_COMMUNITY): Payer: Medicare (Managed Care) | Admitting: Anesthesiology

## 2022-12-29 ENCOUNTER — Encounter (HOSPITAL_COMMUNITY): Payer: Self-pay | Admitting: Family Medicine

## 2022-12-29 ENCOUNTER — Encounter (HOSPITAL_COMMUNITY)
Admission: EM | Disposition: A | Discharge status: Other Acute Inpatient Hospital | Payer: Self-pay | Source: Home / Self Care | Attending: Family Medicine

## 2022-12-29 ENCOUNTER — Other Ambulatory Visit (HOSPITAL_COMMUNITY): Payer: Self-pay

## 2022-12-29 ENCOUNTER — Other Ambulatory Visit: Payer: Self-pay

## 2022-12-29 DIAGNOSIS — L02414 Cutaneous abscess of left upper limb: Secondary | ICD-10-CM

## 2022-12-29 DIAGNOSIS — F17210 Nicotine dependence, cigarettes, uncomplicated: Secondary | ICD-10-CM

## 2022-12-29 DIAGNOSIS — I10 Essential (primary) hypertension: Secondary | ICD-10-CM

## 2022-12-29 DIAGNOSIS — L02413 Cutaneous abscess of right upper limb: Secondary | ICD-10-CM

## 2022-12-29 HISTORY — PX: INCISION AND DRAINAGE ABSCESS: SHX5864

## 2022-12-29 LAB — CBC
HCT: 36.6 % — ABNORMAL LOW (ref 39.0–52.0)
Hemoglobin: 12.3 g/dL — ABNORMAL LOW (ref 13.0–17.0)
MCH: 30.3 pg (ref 26.0–34.0)
MCHC: 33.6 g/dL (ref 30.0–36.0)
MCV: 90.1 fL (ref 80.0–100.0)
Platelets: 203 10*3/uL (ref 150–400)
RBC: 4.06 MIL/uL — ABNORMAL LOW (ref 4.22–5.81)
RDW: 13.2 % (ref 11.5–15.5)
WBC: 6.2 10*3/uL (ref 4.0–10.5)
nRBC: 0 % (ref 0.0–0.2)

## 2022-12-29 LAB — BASIC METABOLIC PANEL
Anion gap: 10 (ref 5–15)
BUN: 10 mg/dL (ref 6–20)
CO2: 25 mmol/L (ref 22–32)
Calcium: 8.8 mg/dL — ABNORMAL LOW (ref 8.9–10.3)
Chloride: 102 mmol/L (ref 98–111)
Creatinine, Ser: 0.78 mg/dL (ref 0.61–1.24)
GFR, Estimated: >60 mL/min (ref >60)
Glucose, Bld: 95 mg/dL (ref 70–99)
Potassium: 3.9 mmol/L (ref 3.5–5.1)
Sodium: 137 mmol/L (ref 135–145)

## 2022-12-29 LAB — HCV RNA QUANT: HCV Quantitative: NOT DETECTED IU/mL (ref >50)

## 2022-12-29 LAB — SARS CORONAVIRUS 2 BY RT PCR: SARS Coronavirus 2 by RT PCR: NEGATIVE

## 2022-12-29 SURGERY — INCISION AND DRAINAGE, ABSCESS
Anesthesia: General | Laterality: Bilateral

## 2022-12-29 MED ORDER — ONDANSETRON HCL 4 MG/2ML IJ SOLN
INTRAMUSCULAR | Status: AC
  Filled 2022-12-29: qty 2

## 2022-12-29 MED ORDER — THIAMINE HCL 100 MG/ML IJ SOLN: 100.0000 mg | Freq: Every day | INTRAMUSCULAR | Status: DC

## 2022-12-29 MED ORDER — SODIUM CHLORIDE 0.9 % IR SOLN
Status: DC | PRN
  Administered 2022-12-29: 3000 mL

## 2022-12-29 MED ORDER — 0.9 % SODIUM CHLORIDE (POUR BTL) OPTIME
TOPICAL | Status: DC | PRN
  Administered 2022-12-29: 1000 mL

## 2022-12-29 MED ORDER — FENTANYL CITRATE (PF) 250 MCG/5ML IJ SOLN
INTRAMUSCULAR | Status: AC
  Filled 2022-12-29: qty 5

## 2022-12-29 MED ORDER — DEXMEDETOMIDINE HCL IN NACL 80 MCG/20ML IV SOLN
INTRAVENOUS | Status: DC | PRN
  Administered 2022-12-29 (×4): 8 ug via BUCCAL

## 2022-12-29 MED ORDER — DEXAMETHASONE SODIUM PHOSPHATE 10 MG/ML IJ SOLN
INTRAMUSCULAR | Status: DC | PRN
  Administered 2022-12-29: 5 mg via INTRAVENOUS

## 2022-12-29 MED ORDER — CHLORHEXIDINE GLUCONATE 0.12 % MT SOLN
15.0000 mL | Freq: Once | OROMUCOSAL | Status: AC
  Administered 2022-12-29: 15 mL via OROMUCOSAL

## 2022-12-29 MED ORDER — MIDAZOLAM HCL 2 MG/2ML IJ SOLN
INTRAMUSCULAR | Status: AC
  Filled 2022-12-29: qty 2

## 2022-12-29 MED ORDER — MORPHINE SULFATE (PF) 2 MG/ML IV SOLN: 1.0000 mg | INTRAVENOUS | Status: DC | PRN

## 2022-12-29 MED ORDER — ACETAMINOPHEN 500 MG PO TABS: 1000.0000 mg | ORAL_TABLET | Freq: Once | ORAL | Status: DC

## 2022-12-29 MED ORDER — OXYCODONE HCL 5 MG PO TABS: 5.0000 mg | ORAL_TABLET | Freq: Once | ORAL | Status: DC | PRN

## 2022-12-29 MED ORDER — TOBRAMYCIN SULFATE 1.2 G IJ SOLR
INTRAMUSCULAR | Status: AC
  Filled 2022-12-29: qty 1.2

## 2022-12-29 MED ORDER — FENTANYL CITRATE (PF) 100 MCG/2ML IJ SOLN
INTRAMUSCULAR | Status: DC | PRN
  Administered 2022-12-29: 50 ug via INTRAVENOUS
  Administered 2022-12-29: 100 ug via INTRAVENOUS

## 2022-12-29 MED ORDER — FENTANYL CITRATE (PF) 100 MCG/2ML IJ SOLN: 25.0000 ug | INTRAMUSCULAR | Status: DC | PRN

## 2022-12-29 MED ORDER — OXYCODONE HCL 5 MG PO TABS: 5.0000 mg | ORAL_TABLET | ORAL | Status: DC | PRN

## 2022-12-29 MED ORDER — CHLORHEXIDINE GLUCONATE 4 % EX LIQD
60.0000 mL | Freq: Once | CUTANEOUS | Status: DC
  Filled 2022-12-29: qty 60

## 2022-12-29 MED ORDER — ONDANSETRON HCL 4 MG/2ML IJ SOLN
INTRAMUSCULAR | Status: DC | PRN
  Administered 2022-12-29: 4 mg via INTRAVENOUS

## 2022-12-29 MED ORDER — PROMETHAZINE HCL 25 MG/ML IJ SOLN: 6.2500 mg | INTRAMUSCULAR | Status: DC | PRN

## 2022-12-29 MED ORDER — LIDOCAINE 2% (20 MG/ML) 5 ML SYRINGE
INTRAMUSCULAR | Status: DC | PRN
  Administered 2022-12-29: 100 mg via INTRAVENOUS

## 2022-12-29 MED ORDER — BACITRACIN ZINC 500 UNIT/GM EX OINT
TOPICAL_OINTMENT | CUTANEOUS | Status: AC
  Filled 2022-12-29: qty 28.35

## 2022-12-29 MED ORDER — LACTATED RINGERS IV SOLN: INTRAVENOUS | Status: DC

## 2022-12-29 MED ORDER — OXYCODONE HCL 5 MG/5ML PO SOLN: 5.0000 mg | Freq: Once | ORAL | Status: DC | PRN

## 2022-12-29 MED ORDER — PROPOFOL 10 MG/ML IV BOLUS
INTRAVENOUS | Status: AC
  Filled 2022-12-29: qty 20

## 2022-12-29 MED ORDER — THIAMINE MONONITRATE 100 MG PO TABS
100.0000 mg | ORAL_TABLET | Freq: Every day | ORAL | Status: DC
  Administered 2022-12-29 – 2022-12-30 (×2): 100 mg via ORAL
  Filled 2022-12-29 (×2): qty 1

## 2022-12-29 MED ORDER — KETAMINE HCL 50 MG/5ML IJ SOSY
PREFILLED_SYRINGE | INTRAMUSCULAR | Status: AC
  Filled 2022-12-29: qty 5

## 2022-12-29 MED ORDER — FOLIC ACID 1 MG PO TABS
1.0000 mg | ORAL_TABLET | Freq: Every day | ORAL | Status: DC
  Administered 2022-12-29 – 2022-12-30 (×2): 1 mg via ORAL
  Filled 2022-12-29 (×2): qty 1

## 2022-12-29 MED ORDER — POVIDONE-IODINE 10 % EX SWAB: 2.0000 | Freq: Once | CUTANEOUS | Status: DC

## 2022-12-29 MED ORDER — PROPOFOL 10 MG/ML IV BOLUS
INTRAVENOUS | Status: DC | PRN
  Administered 2022-12-29: 200 mg via INTRAVENOUS

## 2022-12-29 MED ORDER — ORITAVANCIN DIPHOSPHATE 400 MG IV SOLR
1200.0000 mg | Freq: Once | INTRAVENOUS | Status: AC
  Administered 2022-12-29: 1200 mg via INTRAVENOUS
  Filled 2022-12-29: qty 120

## 2022-12-29 MED ORDER — PHENYLEPHRINE 80 MCG/ML (10ML) SYRINGE FOR IV PUSH (FOR BLOOD PRESSURE SUPPORT)
PREFILLED_SYRINGE | INTRAVENOUS | Status: DC | PRN
  Administered 2022-12-29: 160 ug via INTRAVENOUS

## 2022-12-29 MED ORDER — VANCOMYCIN HCL 1000 MG IV SOLR
INTRAVENOUS | Status: DC | PRN
  Administered 2022-12-29: 1000 mg

## 2022-12-29 MED ORDER — ORAL CARE MOUTH RINSE: 15.0000 mL | Freq: Once | OROMUCOSAL | Status: AC

## 2022-12-29 MED ORDER — LORAZEPAM 1 MG PO TABS: 1.0000 mg | ORAL_TABLET | ORAL | Status: DC | PRN

## 2022-12-29 MED ORDER — VANCOMYCIN HCL 1000 MG IV SOLR
INTRAVENOUS | Status: AC
  Filled 2022-12-29: qty 20

## 2022-12-29 MED ORDER — IPRATROPIUM BROMIDE 0.02 % IN SOLN: 0.5000 mg | Freq: Four times a day (QID) | RESPIRATORY_TRACT | Status: DC | PRN

## 2022-12-29 MED ORDER — MIDAZOLAM HCL 2 MG/2ML IJ SOLN
INTRAMUSCULAR | Status: DC | PRN
  Administered 2022-12-29: 2 mg via INTRAVENOUS

## 2022-12-29 SURGICAL SUPPLY — 50 items
APL PRP STRL LF DISP 70% ISPRP (MISCELLANEOUS) ×1
BAG COUNTER SPONGE SURGICOUNT (BAG) ×1 IMPLANT
BAG SPNG CNTER NS LX DISP (BAG) ×1
BNDG COHESIVE 4X5 TAN STRL (GAUZE/BANDAGES/DRESSINGS) ×1 IMPLANT
BNDG ELASTIC 4X5.8 VLCR STR LF (GAUZE/BANDAGES/DRESSINGS) IMPLANT
BNDG GAUZE DERMACEA FLUFF 4 (GAUZE/BANDAGES/DRESSINGS) ×2 IMPLANT
BNDG GZE DERMACEA 4 6PLY (GAUZE/BANDAGES/DRESSINGS) ×2
BRUSH SCRUB EZ PLAIN DRY (MISCELLANEOUS) ×2 IMPLANT
CHLORAPREP W/TINT 26 (MISCELLANEOUS) ×1 IMPLANT
COVER MAYO STAND STRL (DRAPES) ×1 IMPLANT
COVER SURGICAL LIGHT HANDLE (MISCELLANEOUS) ×2 IMPLANT
DRAPE ORTHO SPLIT 77X108 STRL (DRAPES) ×1
DRAPE SURG 17X23 STRL (DRAPES) ×1 IMPLANT
DRAPE SURG ORHT 6 SPLT 77X108 (DRAPES) ×1 IMPLANT
DRAPE U-SHAPE 47X51 STRL (DRAPES) ×1 IMPLANT
DRSG ADAPTIC 3X8 NADH LF (GAUZE/BANDAGES/DRESSINGS) ×1 IMPLANT
DRSG EMULSION OIL 3X3 NADH (GAUZE/BANDAGES/DRESSINGS) IMPLANT
ELECT REM PT RETURN 9FT ADLT (ELECTROSURGICAL)
ELECTRODE REM PT RTRN 9FT ADLT (ELECTROSURGICAL) IMPLANT
EVACUATOR 1/8 PVC DRAIN (DRAIN) IMPLANT
GAUZE SPONGE 4X4 12PLY STRL (GAUZE/BANDAGES/DRESSINGS) ×1 IMPLANT
GLOVE BIO SURGEON STRL SZ 6.5 (GLOVE) ×3 IMPLANT
GLOVE BIO SURGEON STRL SZ7.5 (GLOVE) ×4 IMPLANT
GLOVE BIOGEL PI IND STRL 6.5 (GLOVE) ×1 IMPLANT
GLOVE BIOGEL PI IND STRL 7.5 (GLOVE) ×1 IMPLANT
GOWN STRL REUS W/ TWL LRG LVL3 (GOWN DISPOSABLE) ×2 IMPLANT
GOWN STRL REUS W/TWL LRG LVL3 (GOWN DISPOSABLE) ×2
HANDPIECE INTERPULSE COAX TIP (DISPOSABLE)
KIT BASIN OR (CUSTOM PROCEDURE TRAY) ×1 IMPLANT
KIT TURNOVER KIT B (KITS) ×1 IMPLANT
MANIFOLD NEPTUNE II (INSTRUMENTS) ×1 IMPLANT
NS IRRIG 1000ML POUR BTL (IV SOLUTION) ×1 IMPLANT
PACK ORTHO EXTREMITY (CUSTOM PROCEDURE TRAY) ×1 IMPLANT
PAD ARMBOARD 7.5X6 YLW CONV (MISCELLANEOUS) ×2 IMPLANT
PAD CAST 4YDX4 CTTN HI CHSV (CAST SUPPLIES) IMPLANT
PADDING CAST COTTON 4X4 STRL (CAST SUPPLIES) ×2
PADDING CAST COTTON 6X4 STRL (CAST SUPPLIES) ×1 IMPLANT
SET HNDPC FAN SPRY TIP SCT (DISPOSABLE) IMPLANT
SPONGE T-LAP 18X18 ~~LOC~~+RFID (SPONGE) ×1 IMPLANT
SUT ETHILON 2 0 FS 18 (SUTURE) ×2 IMPLANT
SUT ETHILON 3 0 PS 1 (SUTURE) ×2 IMPLANT
SUT MON AB 2-0 CT1 36 (SUTURE) ×1 IMPLANT
SUT PDS AB 0 CT 36 (SUTURE) IMPLANT
SWAB CULTURE ESWAB REG 1ML (MISCELLANEOUS) IMPLANT
TOWEL GREEN STERILE (TOWEL DISPOSABLE) ×2 IMPLANT
TOWEL GREEN STERILE FF (TOWEL DISPOSABLE) ×1 IMPLANT
TUBE CONNECTING 12X1/4 (SUCTIONS) ×1 IMPLANT
UNDERPAD 30X36 HEAVY ABSORB (UNDERPADS AND DIAPERS) ×1 IMPLANT
WATER STERILE IRR 1000ML POUR (IV SOLUTION) ×1 IMPLANT
YANKAUER SUCT BULB TIP NO VENT (SUCTIONS) ×1 IMPLANT

## 2022-12-29 NOTE — Progress Notes (Addendum)
Daily Progress Note Intern Pager: [phone]  Patient name: Cory Floyd Medical record number: [id number] Date of birth: Sep 26, 1976 Age: 47 y.o. Gender: male  Primary Care Provider: Pcp, No Consultants: Ortho, Psychiatry Code Status: Full code   Pt Overview and Major Events to Date:  3/5: Admitted to FMTS   Assessment and Plan: Cory Floyd is a 47 y.o. male presenting with R arm pain c/w cellulitis +/- underlying abscess. PMHX includes polysubstance use and bipolar disorder.  * Cellulitis Mild clinical improvement in bilateral AC cellulitis with increasing purulent drainage from R AC. Ortho taking patient to OR this AM for I&D. BC negative @ 3 days. Consult ID for potential Oritavancin use given non-compliance with prior oral abx. -Ortho consulted, I&D today -IV vancomycin -Transition long acting IV vs oral abx pending ID consult -PRN tylenol for pain -F/u Bcx   Bipolar 1 disorder [facility]) Psychiatry recommending inpatient psychiatric care after medical clearance, patient continued to be amenable to this. Denies SI today. 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 Current smoker. Reports improvement in symptoms with Duonebs but causes tremulousness. Will likely need outpatient PFTs. -Switch to Atrovent BID -Benzonatate prn -Flonase  Polysubstance dependence ([facility]) Reports anxiety overnight, given Hydroxyzine prn. Concern for withdrawal per RN given tremulousness, started on CIWA protocol. CIWA 2. Denies signs of withdrawal and states tremors likely secondary to Duonebs. -CIWA protocol -F/u Hep C RNA PCR    FEN/GI: NPO for procedure PPx: Lovenox Dispo: Inpatient psychiatry pending medical stability  Subjective:  Patient assessed at bedside, states he is ready to have a procedure and eat. States his arms  are tender and the R AC is still draining purulent fluid. Continues to have intermittent cough and wheezing. Reports he is still interested in going to inpatient psychiatry when cleared. Denies SI today.  Objective: Temp:  [97.6 F (36.4 C)-99.6 F (37.6 C)] 97.6 F (36.4 C) (03/08 1100) Pulse Rate:  [77-112] 78 (03/08 1115) Resp:  [16-24] 18 (03/08 1115) BP: (103-137)/(56-81) 108/60 (03/08 1115) SpO2:  [92 %-100 %] 100 % (03/08 1115) Physical Exam: General: laying on side in bed, alert Cardiovascular: RRR, no murmur Respiratory: Normal WOB on RA. Intermittent cough. Mild expiratory wheeze diffusely Gastrointestinal: obese, soft, non-tender  MSK: Bilateral AC with erythema, induration and edema. Tender to palpation bilaterally. R AC with mild purulent drainage. No fluctuance noted. Psych: Cooperative. Denies SI or active plan.  Laboratory: Most recent CBC Lab Results  Component Value Date   WBC 6.2 12/29/2022   HGB 12.3 (L) 12/29/2022   HCT 36.6 (L) 12/29/2022   MCV 90.1 12/29/2022   PLT 203 12/29/2022   Most recent BMP    Latest Ref Rng & Units 12/29/2022    8:12 AM  BMP  Glucose 70 - 99 mg/dL 95   BUN 6 - 20 mg/dL 10   Creatinine 0.61 - 1.24 mg/dL 0.78   Sodium 135 - 145 mmol/L 137   Potassium 3.5 - 5.1 mmol/L 3.9   Chloride 98 - 111 mmol/L 102   CO2 22 - 32 mmol/L 25   Calcium 8.9 - 10.3 mg/dL 8.8     Other pertinent labs: BC no growth @ 3 days  Colletta Maryland, MD 12/29/2022, 11:19 AM  PGY-1, [place] Intern pager: [phone], text pages welcome Secure chat group [facility] Medicine  Hospital Teaching Service

## 2022-12-29 NOTE — Progress Notes (Signed)
Patient ID: Cory Floyd, male   DOB: 1976-06-18, 47 y.o.   MRN: [id number]   LOS: 2 days   Subjective: Doing about the same. Pus has begun draining from the right AC fossa.   Objective: Vital signs in last 24 hours: Temp:  [98.5 F (36.9 C)-99.6 F (37.6 C)] 99.6 F (37.6 C) (03/08 0811) Pulse Rate:  [93-112] 94 (03/08 0811) Resp:  [18-24] 19 (03/08 0811) BP: (103-137)/(60-81) 121/79 (03/08 0811) SpO2:  [92 %-100 %] 92 % (03/08 0811)     Laboratory  CBC Recent Labs    12/27/22 0432 12/28/22 0619  WBC 6.0 6.8  HGB 11.4* 11.6*  HCT 34.6* 36.0*  PLT 200 213   BMET Recent Labs    12/27/22 0432 12/28/22 0619  NA 136 136  K 3.7 3.9  CL 102 101  CO2 23 23  GLUCOSE 129* 97  BUN 17 10  CREATININE 1.01 0.73  CALCIUM 8.7* 8.6*     Physical Exam General appearance: alert and no distress Right arm -- Purulent discharge from punctate lesion AC fossa, surrounding erythema and induration/viscous fluctuance. Mod TTP Left arm -- Erythema improved but fluctuant. Mod TTP   Assessment/Plan: Bilateral AC fossae infections -- Plan I&D today with Dr. Doreatha Martin. Please keep NPO.    Lisette Abu, [name] Orthopedic Surgery [phone] 12/29/2022

## 2022-12-29 NOTE — TOC Benefit Eligibility Note (Signed)
Patient Teacher, English as a foreign language completed.    The patient is currently admitted and upon discharge could be taking Incruse Ellipta 62.5 mcg/ act.  The current 30 day co-pay is $0.00.   The patient is currently admitted and upon discharge could be taking Atrovent HFA.  The current 30 day co-pay is $0.00.   The patient is currently admitted and upon discharge could be taking Spiriva  Not of Formulary  The patient is insured through [place], [place] Patient Advocate Specialist [place] Patient Advocate Team Direct Number: [phone]  Fax: [phone]

## 2022-12-29 NOTE — Transfer of Care (Signed)
Immediate Anesthesia Transfer of Care Note  Patient: Cory Floyd  Procedure(s) Performed: INCISION AND DRAINAGE ABSCESS BILATERAL ARMS (Bilateral)  Patient Location: PACU  Anesthesia Type:General  Level of Consciousness: awake and drowsy  Airway & Oxygen Therapy: Patient Spontanous Breathing and Patient connected to face mask oxygen  Post-op Assessment: Report given to RN and Post -op Vital signs reviewed and stable  Post vital signs: Reviewed and stable  Last Vitals:  Vitals Value Taken Time  BP 106/56 12/29/22 1100  Temp    Pulse 77 12/29/22 1100  Resp 16 12/29/22 1100  SpO2 100 % 12/29/22 1100  Vitals shown include unvalidated device data.  Last Pain:  Vitals:   12/29/22 0945  TempSrc: Oral  PainSc:       Patients Stated Pain Goal: 2 ([id number])  Complications: No notable events documented.

## 2022-12-29 NOTE — Progress Notes (Addendum)
FMTS Brief Progress Note  S: Received page from nurse that patient appeared tremulous with noticeable tremor. Went to see patient with Dr. Joelyn Oms, patient asleep in bed with sitter at bedside.   O: BP 113/67 (BP Location: Left Arm)   Pulse 98   Temp 98.5 F (36.9 C) (Oral)   Resp 20   Ht '5\' 10"'$  (1.778 m)   Wt 106.6 kg   SpO2 98%   BMI 33.72 kg/m   GEN: lying on side of bed in no acute distress, sitter at bedside.   UDS+cocaine, amphetamines, BZDs  A/P: Anxiety/Restless Anxious earlier in the night and nurse reported current tremulousness. None noted on physical exam on re-examination but given history of alcohol use, UDS+BZD, will place patient on CIWA. Patient is day 3 of admission, vital signs have been stable.  -monitor CIWA -thiamine -folic acid   - Orders reviewed. Labs for AM ordered, which was adjusted as needed.   Rolanda Lundborg, MD 12/29/2022, 2:26 AM PGY-1, [facility] Night Resident  Please page [phone] with questions.

## 2022-12-29 NOTE — Progress Notes (Signed)
TOC following for discharge needs. Community resources placed on AVS for patient. Psychiatry following as well.   Gilmore Laroche, MSW, [facility]

## 2022-12-29 NOTE — H&P (View-Only) (Signed)
Patient ID: Cory Floyd, male   DOB: 1976-02-12, 47 y.o.   MRN: [id number]   LOS: 2 days   Subjective: Doing about the same. Pus has begun draining from the right AC fossa.   Objective: Vital signs in last 24 hours: Temp:  [98.5 F (36.9 C)-99.6 F (37.6 C)] 99.6 F (37.6 C) (03/08 0811) Pulse Rate:  [93-112] 94 (03/08 0811) Resp:  [18-24] 19 (03/08 0811) BP: (103-137)/(60-81) 121/79 (03/08 0811) SpO2:  [92 %-100 %] 92 % (03/08 0811)     Laboratory  CBC Recent Labs    12/27/22 0432 12/28/22 0619  WBC 6.0 6.8  HGB 11.4* 11.6*  HCT 34.6* 36.0*  PLT 200 213   BMET Recent Labs    12/27/22 0432 12/28/22 0619  NA 136 136  K 3.7 3.9  CL 102 101  CO2 23 23  GLUCOSE 129* 97  BUN 17 10  CREATININE 1.01 0.73  CALCIUM 8.7* 8.6*     Physical Exam General appearance: alert and no distress Right arm -- Purulent discharge from punctate lesion AC fossa, surrounding erythema and induration/viscous fluctuance. Mod TTP Left arm -- Erythema improved but fluctuant. Mod TTP   Assessment/Plan: Bilateral AC fossae infections -- Plan I&D today with Dr. Doreatha Martin. Please keep NPO.    Lisette Abu, [name] Orthopedic Surgery [phone] 12/29/2022

## 2022-12-29 NOTE — Discharge Summary (Shared)
Cory Floyd  Patient name: Cory Floyd Medical record number: [id number] Date of birth: 07-23-1976 Age: 47 y.o. Gender: male Date of Admission: 12/26/2022  Date of Discharge: 12/30/2022 Admitting Physician: Kinnie Feil, MD  Primary Care Provider: Pcp, No Consultants: Orthopedics, Psychiatry  Indication for Hospitalization: Cellulitis of both arms  Discharge Diagnoses/Problem List:  Principal Problem for Admission: Cellulitis of both arms Other Problems addressed during stay:  Principal Problem:   Cellulitis Active Problems:   Bipolar 1 disorder ([place])   Polysubstance dependence ([place])   Cough   Abscess of arm, left  Brief Hospital Course:  Cory Floyd is a 47 y.o.male with a history of polysubstance use and bipolar disorder  who was admitted to the [facility] Medicine Teaching Service at [facility] for R arm pain. His hospital course is detailed below:  Bilateral AC cellulitis Admitted with new onset R AC cellulitis in the setting of IVDU. Admitted on 12/19/2022 from L AC cellulitis that was treated with IV abx but patient did not complete oral course. US showed potentially drainable abscess on L AC. Orthopedics consulted and performed I&D of bilateral AC abscesses with wound culture sent. Patient received IV Vancomycin x 36 hours and received one time dose Oritavancin for antibiotic coverage for 7 days.  Bipolar disorder Suicidal ideation/attempt Patient states he took a large amount of methamphetamine prior to admission with intention to take his life. Non-adherent with Bipolar medications and endorsing paranoia and hallucinations in the ED. Placed on suicide precautions and psychiatry consulted. Psychiatry recommended restarting home meds and inpatient psychiatric management once medically cleared. Patient discharged to [facility].  IVDU - Methamphetamine Possible sharing of needles, obtained Hep C and HIV. Hep C antibody reactive but  PCR test negative.   PCP Follow-up Recommendations:  PFTs, cough and wheezing inpatient suspicious for underlying obstructive lung disease Normocytic anemia, consider outpatient workup  Disposition: Inpatient psychiatry - [place]  Discharge Condition: Stable   Discharge Exam:  Vitals:   12/30/22 0800 12/30/22 1200  BP: 126/63 138/73  Pulse: 77   Resp: (!) 24   Temp: 97.7 F (36.5 C) 97.9 F (36.6 C)  SpO2: 91%    General: Laying on side in bed, alert Cardiovascular: RRR, no murmur Respiratory: Normal WOB on RA. Intermittent cough. No wheezing noted Gastrointestinal: Soft, non-tender, non-distending MSK: Bilateral AC wrapped with clean,dry bandage. 2+ radial pulse bilaterally. No erythema or edema noted. Psych: Cooperative and calm. Denies SI  Significant Procedures: I&D of L AC abscess  Significant Labs and Imaging:  Recent Labs  Lab 12/29/22 0812 12/30/22 0321  WBC 6.2 7.1  HGB 12.3* 11.9*  HCT 36.6* 35.6*  PLT 203 222   Recent Labs  Lab 12/29/22 0812  NA 137  K 3.9  CL 102  CO2 25  GLUCOSE 95  BUN 10  CREATININE 0.78  CALCIUM 8.8*    Pertinent Imaging: [place] LT UPPER EXTREM LTD SOFT TISSUE NON VASCULAR Result Date: 12/27/2022 IMPRESSION: Cellulitis of the left antecubital fossa with an approximately 3 x 1 x 2 cm phlegmon/developing abscess.   [place] RT UPPER EXTREM LTD SOFT TISSUE NON VASCULAR Result Date: 12/27/2022 IMPRESSION: Ill-defined edema within the soft tissue in the area of the patient's concern in the distal upper arm and proximal forearm. No drainable fluid collection.   DG Elbow 2 Views Right  Result Date: 12/26/2022 IMPRESSION: Generalized soft tissue edema. No osseous abnormality.   DG Chest Port 1 View Result Date: 12/26/2022  IMPRESSION: Mild subsegmental atelectasis or scarring at the left lung base, similar to prior exam. No acute findings.   Results/Tests Pending at Time of Discharge: Abscess wound culture  Discharge Medications:   Allergies as of 12/30/2022       Reactions   Carrot [daucus [place]] Anaphylaxis, Rash   Penicillins Anaphylaxis, Hives   Has patient had a PCN reaction causing immediate rash, facial/tongue/throat swelling, SOB or lightheadedness with hypotension: Yes Has patient had a PCN reaction causing severe rash involving mucus membranes or skin necrosis: No Has patient had a PCN reaction that required hospitalization Yes Has patient had a PCN reaction occurring within the last 10 years: No If all of the above answers are "NO", then may proceed with Cephalosporin use.   [place]ipen [ampicillin] Anaphylaxis, Hives        Medication List     STOP taking these medications    acetaminophen 325 MG tablet Commonly known as: TYLENOL   acetaminophen 500 MG tablet Commonly known as: TYLENOL       TAKE these medications    ARIPiprazole 10 MG tablet Commonly known as: ABILIFY Take 1 tablet (10 mg total) by mouth daily.   Atrovent HFA 17 MCG/ACT inhaler Generic drug: ipratropium Inhale 2 puffs into the lungs every 6 (six) hours as needed for wheezing.   benzonatate 100 MG capsule Commonly known as: TESSALON Take 1 capsule (100 mg total) by mouth 3 (three) times daily as needed for cough.   divalproex 500 MG 24 hr tablet Commonly known as: DEPAKOTE ER Take 2 tablets (1,000 mg total) by mouth at bedtime.   mirtazapine 15 MG tablet Commonly known as: REMERON Take 1 tablet (15 mg total) by mouth at bedtime. For depression/sleep        Discharge Instructions: Please refer to Patient Instructions section of EMR for full details.  Patient was counseled important signs and symptoms that should prompt return to medical care, changes in medications, dietary instructions, activity restrictions, and follow up appointments.   Follow-Up Appointments:  Follow-up Information     Haddix, Thomasene Lot, MD. Schedule an appointment as soon as possible for a visit.   Specialty: Orthopedic Surgery Why:  Please make an appointment to follow up in 2 weeks. Contact information: [place] [address] [phone]                 Colletta Maryland, MD 12/30/2022, 1:51 PM PGY-1, [place]

## 2022-12-29 NOTE — Anesthesia Postprocedure Evaluation (Signed)
Anesthesia Post Note  Patient: Cory Floyd  Procedure(s) Performed: INCISION AND DRAINAGE ABSCESS BILATERAL ARMS (Bilateral)     Patient location during evaluation: PACU Anesthesia Type: General Level of consciousness: awake Pain management: pain level controlled Vital Signs Assessment: post-procedure vital signs reviewed and stable Respiratory status: spontaneous breathing, nonlabored ventilation and respiratory function stable Cardiovascular status: blood pressure returned to baseline and stable Postop Assessment: no apparent nausea or vomiting Anesthetic complications: no   No notable events documented.  Last Vitals:  Vitals:   12/29/22 1145 12/29/22 1201  BP: 109/73 101/83  Pulse: 80 88  Resp: (!) 24 20  Temp: 36.6 C   SpO2: 95% 94%    Last Pain:  Vitals:   12/29/22 1145  TempSrc:   PainSc: 3                  Nilda Simmer

## 2022-12-29 NOTE — Interval H&P Note (Signed)
History and Physical Interval Note:  12/29/2022 9:57 AM  Cory Floyd  has presented today for surgery, with the diagnosis of Right arm abscess.  The various methods of treatment have been discussed with the patient and family. After consideration of risks, benefits and other options for treatment, the patient has consented to  Procedure(s): INCISION AND DRAINAGE ABSCESS BILATERAL ARMS (Bilateral) as a surgical intervention.  The patient's history has been reviewed, patient examined, no change in status, stable for surgery.  I have reviewed the patient's chart and labs.  Questions were answered to the patient's satisfaction.     Lennette Bihari P [facility]

## 2022-12-29 NOTE — Assessment & Plan Note (Addendum)
Persistent cough and wheezing since admission. Current smoker. Reports improvement in symptoms with Duonebs but causes tremulousness. Will likely need outpatient PFTs. -Switch to Atrovent BID -Benzonatate prn -Flonase

## 2022-12-29 NOTE — Anesthesia Preprocedure Evaluation (Addendum)
Anesthesia Evaluation  Patient identified by MRN, date of birth, ID band Patient awake    Reviewed: Allergy & Precautions, NPO status , Patient's Chart, lab work & pertinent test results  History of Anesthesia Complications Negative for: history of anesthetic complications  Airway Mallampati: III  TM Distance: >3 FB Neck ROM: Full   Comment: Previous grade I view with glidescope 3, easy mask Dental  (+) Dental Advisory Given, Poor Dentition Missing several teeth. Denies loose teeth.:   Pulmonary neg shortness of breath, sleep apnea , neg COPD, neg recent URI, Current Smoker and Patient abstained from smoking.   Pulmonary exam normal breath sounds clear to auscultation       Cardiovascular hypertension, (-) angina (-) Past MI, (-) Cardiac Stents and (-) CABG (-) dysrhythmias  Rhythm:Regular Rate:Normal     Neuro/Psych  PSYCHIATRIC DISORDERS  Depression Bipolar Disorder Schizophrenia  negative neurological ROS     GI/Hepatic ,GERD  ,,(+)     substance abuse  alcohol use, cocaine use and methamphetamine use, Hepatitis -, CFatty liver   Endo/Other  diabetes, Type 2    Renal/GU      Musculoskeletal  (+) Arthritis ,    Abdominal  (+) + obese  Peds  Hematology   Anesthesia Other Findings Admitted with new onset R AC cellulitis in the setting of IVDU.  Patient states he took a large amount of methamphetamine prior to admission with intention to take his life. Patient reports last cocaine Monday or Tuesday this week.  Reproductive/Obstetrics                             Anesthesia Physical Anesthesia Plan  ASA: 3  Anesthesia Plan: General   Post-op Pain Management: Tylenol PO (pre-op)*   Induction: Intravenous  PONV Risk Score and Plan: 1 and Ondansetron and Treatment may vary due to age or medical condition  Airway Management Planned: LMA  Additional Equipment:   Intra-op Plan:    Post-operative Plan: Extubation in OR  Informed Consent: I have reviewed the patients History and Physical, chart, labs and discussed the procedure including the risks, benefits and alternatives for the proposed anesthesia with the patient or authorized representative who has indicated his/her understanding and acceptance.     Dental advisory given  Plan Discussed with: CRNA and Anesthesiologist  Anesthesia Plan Comments: (Risks of general anesthesia discussed including, but not limited to, sore throat, hoarse voice, chipped/damaged teeth, injury to vocal cords, nausea and vomiting, allergic reactions, lung infection, heart attack, stroke, and death. All questions answered. )        Anesthesia Quick Evaluation

## 2022-12-29 NOTE — Op Note (Signed)
Orthopaedic Surgery Operative Note (CSN: [id number] ) Date of Surgery: 12/29/2022  Admit Date: 12/26/2022   Diagnoses: Pre-Op Diagnoses: Bilateral antecubital fossa abscesses  Post-Op Diagnosis: Same  Procedures: CPT 23930-Incision and drainage of right antecubital fossa abscess CPT 23930-Incision and drainage of left antecubital fossa abscess  Surgeons : Primary: Shona Needles, MD  Assistant: Patrecia Pace, [name]  Location: OR 3   Anesthesia: General   Antibiotics: Vancomycin preop with 1 gm vancomycin powder placed topically between the two abscess incisions   Tourniquet time: None    Estimated Blood Loss: 25 mL  Complications:* No complications entered in OR log *   Specimens: ID Type Source Tests Collected by Time Destination  A : Right arm Tissue PATH Other AEROBIC/ANAEROBIC CULTURE W GRAM STAIN (SURGICAL/DEEP WOUND) Shona Needles, MD 12/29/2022 1039   B : Left arm Tissue PATH Other AEROBIC/ANAEROBIC CULTURE W GRAM STAIN (SURGICAL/DEEP WOUND) Shona Needles, MD 12/29/2022 1041      Implants: * No implants in log *   Indications for Surgery: 47 year old male who developed abscesses to both of his antecubital fossa.  They did not resolved with antibiotics.  Due to the persistent pain and swelling I recommended proceeding with a incision and drainage of both.  Risks and benefits were discussed with the patient.  He agreed to proceed with surgery and consent was obtained.  Operative Findings: Incision and drainage of bilateral antecubital fossa abscesses  Procedure: The patient was identified in the preoperative holding area. Consent was confirmed with the patient and their family and all questions were answered. The operative extremity was marked after confirmation with the patient. he was then brought back to the operating room by our anesthesia colleagues.  He was carefully transferred over to a regular OR table.  He was placed under general anesthetic and bilateral  upper extremities were then prepped and draped in usual sterile fashion.  A timeout was performed to verify the patient, the procedure, and the extremities.  Preoperative antibiotics had been given.  I for started out with the right side.  There is a pinpoint area of drainage.  I was able to extend this proximally and distally approximately 5 cm.  I kept down through the skin and subcutaneous tissue encountering necrotic soft tissue that was debrided with the use of a ronguer the soft tissue and purulent material was sent for culture.  We then debrided the area it did not track deep into the forearm musculature.  I then used cystoscopy tubing to irrigate the wound.  Once we have the right side performed we then turned our attention to the left side.  A 4 cm incision was made over the fluctuant area to his left forearm.  This carried down through skin and subcutaneous tissue.  Clear necrotic fluid was encountered.  This was sent for culture.  I then debrided the remainder of the tissue.  We irrigated with the remainder of the normal saline.  A gram of vancomycin powder was split between the 2 incisions.  A 3-0 nylon was used to loosely approximate skin edges.  Sterile dressings consisting of Mepitel, 4 x 4's, sterile cast padding and Ace wrap were placed.  Patient was then awoke from anesthesia and taken to the PACU in stable condition.  Post Op Plan/Instructions: Patient will be weightbearing as tolerated bilateral upper extremities.  He will continue to receive IV antibiotics.  Patient to be placed on DVT prophylaxis while inpatient and discharged home on no DVT prophylaxis.  He will have unrestricted range of motion of bilateral upper extremities.  I was present and performed the entire surgery.  Patrecia Pace, [name] did assist me throughout the case. An assistant was necessary given the difficulty in approach, maintenance of reduction and ability to instrument the fracture.   Katha Hamming,  MD [facility]

## 2022-12-29 NOTE — Discharge Instructions (Addendum)
Dear Cory Floyd,   Thank you for letting us participate in your care! In this section, you will find a brief hospital admission summary of why you were admitted to the hospital, what happened during your admission, your diagnosis/diagnoses, and recommended follow up.  Primary diagnosis: Cellulitis of both your arms Treatment plan: We treated you with IV antibiotics and you were evaluated by the orthopedic doctor for drainage of an abscess on your arms.    POST-HOSPITAL & CARE INSTRUCTIONS We recommend following up with your PCP within 1 week from being discharged from the hospital. Please let PCP/Specialists know of any changes in medications that were made which you will be able to see in the medications section of this packet.  DOCTOR'S APPOINTMENTS & FOLLOW UP Future Appointments  Date Time Provider [place]  01/03/2023  3:50 PM Kerin Perna, NP [facility] None     Thank you for choosing [facility]! Take care and be well!  [facility]  [place], [place] [address] [phone]   [facility] assistance programs Crisis assistance programs   -Partners Ending Homelessness Coordinated Entry Program. If you are experiencing homelessness in [place], [place], your first point of contact should be Chiropractor. You can reach Coordinated Entry by calling [phone] or by emailing [email or web address] .org.  Community access points: [employer] ([place]. [address], [place]) every Tuesday from [place] ([address]. [place]) every Wednesday from 8am-9am.   -The [employer] 418-161-9170) offers several services to local families, as funding allows. The Emergency Assistance Program (EAP), which they administer, provides household goods, free food, clothing, and financial aid to people in  need in the [facility] area. The EAP program does have some qualification, and counselors will interview clients for financial assistance by written referral only. Referrals need to be made by the Department of Social Services or by other EAP approved human services agencies or charities in the area.  -Open Door Ministries of [employer], which can be reached at 437-651-3691, offers emergency assistance programs for those in need of help, such as food, rent assistance, a soup kitchen, shelter, and clothing. They are based in [facility] but provide a number of services to those that qualify for assistance.   -[place] may be able to offer temporary financial assistance and cash grants for paying rent and utilities, Help may be provided for local county residents who may be experiencing personal crisis when other resources, including government programs, are not available. Call (616)[phone]  -[place] is a [place] of [place], The organization can offer emergency assistance for paying rent, [employer], utilities, food, household products and furniture. They offer extensive emergency and transitional housing for families, children and single women, and also run a 24 and [employer]. Thrift Shops, Financial controller, and other aid offered too. [address], [place], [place], (732)[phone]  -[place] -- This is offered for [facility] families. The federal government created [employer] H[employer]away Program provides a one-time cash grant payment to help eligible low-income families pay their electric and heating bills. [address], [place], [place] [id number], [phone]  -[place] Emergency Assistance -- A program offers emergency utility and rent funds for greater [employer] area residents. The  program can also provide  counseling and referrals to charities and government programs. Also provides food and a free meal program that serves lunch Mondays - Saturdays and dinner seven days per week to individuals in the community. [address], [place], [place], [phone]  -[place] affordable apartment and housing communities across      [place] and [place]. The low income and seniors can access public housing, rental assistance to qualified applicants, and apply for the section 8 rent subsidy program. Other programs include Visual merchandiser and Barrister's clerk. [address] [place], [place], dial [phone].  -The [place] provides transitional housing to veterans and the disabled. Clients will also access other services too, including assistance in applying for Disability, life skills classes, case management, and assistance in finding permanent housing. [address], [place], [place], call [phone]  -[place] through [employer] is for people who were just evicted or that are formerly homeless. The non-profit will also help then gain self-sufficiency, find a home or apartment to live in, and also provides information on rent assistance when needed. Phone [phone]  -The [place] [employer]ad [employer] helps low income, elderly, or disabled residents in seven counties in the [place] ([place], [place], [place], [place], [place], [place], [place], and [place]) save energy and reduce their utility bills by improving energy efficiency. Phone [phone].  -[employer] is located in the [place] in the [employer], [address], [place] [address] [place], [place], [place] [address]. Parking is in the rear of the building. Phone: [phone]    General Email: [email or web address]'@[email or web address] .org  GHC provides free housing counseling assistance in locating affordable rental housing or housing with support services for families and individuals in crisis and the chronically homeless. We provide potential resources for other housing needs like utilities. Our trained counselors also work with clients on budgeting and financial literacy in effort to empower them to take control of their financial situations. [employer] collaborates with homeless service providers and other stakeholders as part of the [place] ([place]). The ([place]) is a regional/local planning body that coordinates housing and services funding for homeless families and individuals. The role of [place] in the [place] is through housing counseling to work with people we serve on diversion strategies for those that are at imminent risk of becoming homeless. We also work with the Coordinated Assessment/Entry Specialist who attempts to find temporary solutions and/or connects the people to Housing First, [place] or transitional housing programs. [place] Counselors meet with clients on business days (Monday-Fridays, except scheduled holidays) from 8:30 am to 4:30 pm.  Legal assistance for evictions, foreclosure, and more -If you need free legal advice on civil issues, such as foreclosures, evictions, Mudlogger, government programs, domestic issues and more, Scientist, research (physical sciences) Aid of [facility]) is a Teacher, adult education firm that provides free legal services and counsel to lower income people, seniors, disabled, and others, The goal is to ensure everyone has access to justice and fair representation. Call them at (904)119-5392.  [facility] for Housing and Community Studies can provide info about obtaining legal assistance with evictions. Phone (430)062-8866.  Training [place]. offers job and  Database administrator. Resources are focused on helping students obtain the skills and experiences that are necessary to compete in today's challenging and tight  job Scientist, product/process development. The non-profit faith-based community action agency offers internship trainings as well as classroom instruction. Classes are tailored to meet the needs of people in the [facility] region. [place], [place] [address], ([phone]  Foreclosure prevention/Debt Services Family Services of the [place] and foreclosure prevention programs for local families. This includes money management, financial advice, budget review and development of a written action plan with a Higher education careers adviser to help solve specific individual financial problems. In addition, housing and mortgage counselors can also provide pre- and post-purchase homeownership counseling, default resolution counseling (to prevent foreclosure) and reverse mortgage counseling. A Debt Management Program allows people and families with a high level of credit card or medical debt to consolidate and repay consumer debt and loans to creditors and rebuild positive credit ratings and scores. Contact (336) U8783921.  Community clinics in [facility]: [address], [place], [place]. [phone].  -Health Department [place] [facility]: [address], [facility], [address]. [phone].  -[place] offers medical care through a group of doctors, pharmacies and other healthcare related agencies that offer services for low income, uninsured adults in [place]. Also offers adult Dental care and assistance with applying for an [employer]. Call ([phone].   -[place]. This center provides low-cost health care to those without health insurance. Services offered include an onsite pharmacy.  Phone ([phone]. [address], [place], [place].  -Medication Assistance Program serves as a link between pharmaceutical companies and patients to provide low cost or free prescription medications. This service is available for residents who meet certain income restrictions and have no insurance coverage. PLEASE CALL [phone] Lady [place]) OR [phone] ([place])  -One Step Further: Merchant navy officer, [employer] Support & Nutrition Program, The [employer]. Call (413) 669-7029/ (336) ([phone].  Food pantry and assistance -Urban Ministry-Food Bank: [address], [place] [address]. Phone [phone]  -Blessed Table Food Pantry: [address] [place], [place] [address]. [phone].  -[place]: https://findfood.BidStrong.co.za  F[place]EING VIOLENCE:  -Family Services of the [place]- 24/7 Crisis line (856)076-1989) -[place]: (336) 641-SAFE 850-375-0116)   [place] 2-1-1 is another useful way to locate resources in the community. Visit PricingGame.co.uk to find service information online. If you need additional assistance, 2-1-1 Referral Specialists are available 24 hours a day, every day by dialing 2-1-1 or 9081969460 from any phone. The call is free, confidential, and available in any language.  Affordable Housing Search  http://www.nchousingsearch.[place] [facility])   M-F 8a-3p [address], [place] [address] ([phone] Services include: laundry, barbering, support groups, case management, phone & computer access, showers, AA/NA mtgs, mental health/substance abuse nurse, job skills class, disability information, VA assistance, spiritual classes, etc. Winter Shelter available when temperatures are less than 32 degrees.   HOMELESS [place] at [facility]- Call [phone] ext. [phone] or ext. [phone]. Located at [address], [place], [place] [address]  Open [place]- Call [phone]. Located at [address], [place].  [place]. Call [phone]. Office located at [address] [place].  Pathways Family Housing through [place] ([phone].  [place]- Call [phone]. Located at [place], [place], [place] [address].  Room at the Inn-For Pregnant mothers. Call [phone]. Located at [address] [place], [place].  [place] Shelter of Hope-For men in [place]. Call [phone].  Home of Levi Strauss for Entergy Corporation [phone]. Office located at [place]. [address], [place], [place].  Du Pont be agreeable to help with chores. Call [phone] ext. [phone].  Men's: [place]., [place], [place] [address]. Women's: [facility]  [address] [place], [place] [address]  Crisis Services Therapeutic Alternatives Mobile Crisis Management- ([phone]  [facility] [address] [place], [place] [address]. Phone: [phone]    In a time of Crisis: Therapeutic Alternatives, inc.  Mobile Crisis Management provides immediate crisis response, 24/7.  Call [phone]  [facility] for MH/DD/SA [facility] is available 24 hours a day, 7 days a week. Customer Service Specialists will assist you to find a crisis provider that is well-matched with your needs. Your local number is: [phone]  [facility]al [facility] Urgent Care ([place]) IOP, individual counseling, medication management [place], [place] [address] [phone] Call for intake hours; Medicaid and Uninsured    Outpatient Providers  Alcohol and Drug Services (ADS) Group and individual counseling. [address]  [place], [place] [address] [phone] [place]:  [phone]  [place]: ([phone]) [phone] Medicaid and uninsured.   The [place] IOP groups multiple times per week. [place], [place], [place] [address] [phone] Takes Medicaid and other insurances.   [place] [facility]  Chemical Dependency Intensive Outpatient Program (IOP) [address] [place], [place] [address] [phone] Takes Pharmacist, community and [place].   [place]  IOP and Partial Hospitalization Program  [place].  [place], [place] [address] [phone] Private Insurance, [place] only for partial hospitalization  ACDM Assessment and Counseling of [place]., [place], [place], [place] [address] [phone] Monday-Friday. Short and Long term options. [facility]/Behavioral Health Urgent Care ([place]) IOP, individual counseling, medication management [place], [place] [address] [phone] Medicaid and [facility]  [place] [address]  [place], [place] [address] [phone] Private Insurance and [place] Outpatient [address]  [place], [place] [address] [phone] Private Insurance, [place], and Self Pay   [facility]: Methadone Clinic  [place], [place] [address] [facility]  [address]  [place], [place] [address] [phone]  Caring Services  [address] [place], [place] [address] [phone]      Residential Treatment Programs  [facility] ([place].) [place], [place] [address] [phone] or [phone] Detox and Residential Rehab 14 days (Medicare, Medicaid, private insurance, and self pay). No methadone. Call for pre-screen.   [facility] Treatment Services) [place], [place] [address] [phone] Detox (self Pay and Medicaid Limited availability) Rehab Only  Male (Medicare, Medicaid, and Self Pay)-No methadone.  Fellowship [address] [place], [place] [address] [phone] or [phone] Private Insurance only  [place] PHONE: [phone] FAX: [phone] Residential program for women 21 and over for up to a year through a Christian 12-step recovery model. Self-pay.       Path of [place], [place] [address] Phone:  [phone] Must be detoxed 72 hours prior to  admission; 28 day program.  Self-pay.  [facility] [place], [place] [phone] [name], Medicare, [place] (not straight [place]). They offer assistance with transportation.   [facility] [address] [place],  [place], [place] [address] [phone] Christian Based Program. Men only. No insurance  [facility] is a substance use disorder treatment program for women, including those who are pregnant, parenting, and/or whose lives have been touched by abuse and violence. [phone]) [phone]         [facility] [address] [place], [place] [address] Women's: [phone] Men's: [phone] No Medicaid.   Addiction Centers of [place]. (mainly [place]) willing to help with transportation.  ([phone] [employer]. [place].  [place], [place] [address] [phone] Treatment Only, must make assessment appointment, and must be sober for assessment appointment. Self pay, Medicare A and B, [facility], must be [facility] resident. No methadone.   [place], [place] [address] [phone] No pending legal charges, Long-term work program. No methadone. Call for assessment.  [facility]  [address] [place], [place] [address] [phone] or [phone] [place] [phone] Private Insurance (no [place]). Males/Females, call to make referrals, multiple facilities   [facility] [address]  [place], [place] [address]  [phone] Men Only Upfront Fee [place] Women's Program: [facility] [place], [place] [address] [phone]  [facility]-no methadone Men's Campus [address] [place], [place] [address] [phone] (f)  [place], [place] [address] [phone] [facility] [phone] [place], [place] For women, houses 8 residents for sober living. No Medicaid.         Syringe Services Program Due to COVID-19, syringe services programs are likely operating under different hours with limited or no fixed site hours. Some programs may not be operating at all. Please contact the program directly using the phone numbers provided below to see if they are still operating under [place] Solution to the Opioid Problem (GCSTOP) Fixed; mobile; peer-based Midge Aver [phone] [email or web address] Fixed site exchange at [facility], [place]. [place], [place] [address] on Wednesdays (2:00 - 5:00 pm) and Thursdays (4:00 - 8:00 pm). Pop-up mobile exchange locations: [employer] and [employer] Lot, [address] [place], [place] [address] on Tuesdays (11:00 am - 1:00 pm) and Fridays (11:00 am - 1:00 pm) -[place], [place], [place] [address] on Tuesdays (2:00 - 4:00 pm) and Fridays (2:00 - 4:00 pm) -[place] [facility] - also serves [place] and [employer] [place] Cisco Fixed; mobile; peer-based Rosemary [place]ms [phone] [email or web address] .[email or web address] [address] [place], [place] [address] Delivery and outreach available in [place] and [place], please call for more information. Monday, Tuesday: 1:00 -7:00 pm Thursday: 4:00 pm - 8:00 pm Friday: 1:00 pm - 8:00 pm)       Medication-Assisted Treatment (MAT) -[place]- services [place] and surrounding areas including [place], [place], [place], [place], [place], [place], [place], [place], [place], and [place], [place]. Options include Methadone, buprenorphine or Suboxone. [address] [place], [place] [address] Phone: [phone] Mon - Fri: 5:30am - 2:00pm Sat: 5:30am -7:30am Sun:  Closed Holidays: 6:00am - 8:00am  -[facility] [place]- We use FDA-approved medications, like methadone/suboxone/sublocade, and vivitrol. These medications are then combined with customized care plans that include individual or group counseling, toxicology, and medical care directed by on-site physicians. Accepts most insurance plans, Medicaid, and private pay.  [place], [place] [address] Phone: [phone] Monday-Friday 5:00 AM - 10:00 AM Saturday 6:00 AM - 8:30 AM Sunday 6:00 AM - 7:00 AM  -Alcohol & Drug Services- ADS is a treatment & recovery focused program. In addition to receiving methadone medication, our clients participate in individual and group counseling as well as random drug testing. If accepted into the ADS Opioid Program, you will be provided several intake appointments and a physical exam [place], [place] [address] Office: [phone]  Fax: [phone]  -[facility]- We put our community members at the center of everything we do, for remote treatment services as well as in-person, from alcohol withdrawal to opioid use and more.  [place], [place], [place], [place] [address] [phone] Monday-Wednesday: 9:00am - 5:00pm Thursday: 9:00am - 6:00pm Friday: 9:00am - 5:00pm Saturday: 9:00am - 1:00pm Sunday: Closed   -[facility] [employer]  [place]) [address], [place], [place] [address] [phone]  [place] [phone] [address] [place], [place] [address]  M-W    5:00am-12:00pm Thu     5:00am-10:00am Fri       5:00am-12:00pm Sat      5:00am-8:00am Sun     Closed  $12/daily for Methadone Treatment.

## 2022-12-29 NOTE — Progress Notes (Signed)
Patient going to or for cellulitis bilateral arms. Report called to or.

## 2022-12-29 NOTE — Anesthesia Procedure Notes (Signed)
Procedure Name: LMA Insertion Date/Time: 12/29/2022 10:23 AM  Performed by: Inda Coke, CRNAPre-anesthesia Checklist: Patient identified, Emergency Drugs available, Suction available, Timeout performed and Patient being monitored Patient Re-evaluated:Patient Re-evaluated prior to induction Oxygen Delivery Method: Circle system utilized Preoxygenation: Pre-oxygenation with 100% oxygen Induction Type: IV induction Ventilation: Mask ventilation without difficulty LMA: LMA inserted LMA Size: 4.0 Tube type: Oral Placement Confirmation: positive ETCO2, CO2 detector and breath sounds checked- equal and bilateral Tube secured with: Tape Dental Injury: Teeth and Oropharynx as per pre-operative assessment

## 2022-12-30 ENCOUNTER — Encounter (HOSPITAL_COMMUNITY): Payer: Self-pay | Admitting: Student

## 2022-12-30 ENCOUNTER — Other Ambulatory Visit: Payer: Self-pay

## 2022-12-30 ENCOUNTER — Inpatient Hospital Stay
Admission: AD | Admit: 2022-12-30 | Discharge: 2023-01-22 | DRG: 897 | Disposition: A | Discharge status: Home / Self Care | Payer: Medicare (Managed Care) | Source: Intra-hospital | Attending: Psychiatry | Admitting: Psychiatry

## 2022-12-30 DIAGNOSIS — Z91018 Allergy to other foods: Secondary | ICD-10-CM | POA: Diagnosis not present

## 2022-12-30 DIAGNOSIS — F1414 Cocaine abuse with cocaine-induced mood disorder: Secondary | ICD-10-CM | POA: Diagnosis present

## 2022-12-30 DIAGNOSIS — T50916A Underdosing of multiple unspecified drugs, medicaments and biological substances, initial encounter: Secondary | ICD-10-CM | POA: Diagnosis not present

## 2022-12-30 DIAGNOSIS — I10 Essential (primary) hypertension: Secondary | ICD-10-CM | POA: Diagnosis present

## 2022-12-30 DIAGNOSIS — Z880 Allergy status to penicillin: Secondary | ICD-10-CM | POA: Diagnosis not present

## 2022-12-30 DIAGNOSIS — Z91128 Patient's intentional underdosing of medication regimen for other reason: Secondary | ICD-10-CM

## 2022-12-30 DIAGNOSIS — F1011 Alcohol abuse, in remission: Secondary | ICD-10-CM | POA: Diagnosis present

## 2022-12-30 DIAGNOSIS — R031 Nonspecific low blood-pressure reading: Secondary | ICD-10-CM | POA: Diagnosis not present

## 2022-12-30 DIAGNOSIS — K219 Gastro-esophageal reflux disease without esophagitis: Secondary | ICD-10-CM | POA: Diagnosis present

## 2022-12-30 DIAGNOSIS — M1990 Unspecified osteoarthritis, unspecified site: Secondary | ICD-10-CM | POA: Diagnosis present

## 2022-12-30 DIAGNOSIS — Y838 Other surgical procedures as the cause of abnormal reaction of the patient, or of later complication, without mention of misadventure at the time of the procedure: Secondary | ICD-10-CM | POA: Diagnosis not present

## 2022-12-30 DIAGNOSIS — L0390 Cellulitis, unspecified: Secondary | ICD-10-CM | POA: Diagnosis present

## 2022-12-30 DIAGNOSIS — L03113 Cellulitis of right upper limb: Secondary | ICD-10-CM | POA: Diagnosis present

## 2022-12-30 DIAGNOSIS — F319 Bipolar disorder, unspecified: Secondary | ICD-10-CM | POA: Diagnosis present

## 2022-12-30 DIAGNOSIS — F17210 Nicotine dependence, cigarettes, uncomplicated: Secondary | ICD-10-CM | POA: Diagnosis present

## 2022-12-30 DIAGNOSIS — T8131XA Disruption of external operation (surgical) wound, not elsewhere classified, initial encounter: Secondary | ICD-10-CM

## 2022-12-30 DIAGNOSIS — Z8249 Family history of ischemic heart disease and other diseases of the circulatory system: Secondary | ICD-10-CM | POA: Diagnosis not present

## 2022-12-30 DIAGNOSIS — E119 Type 2 diabetes mellitus without complications: Secondary | ICD-10-CM | POA: Diagnosis present

## 2022-12-30 DIAGNOSIS — Z8719 Personal history of other diseases of the digestive system: Secondary | ICD-10-CM

## 2022-12-30 DIAGNOSIS — L03119 Cellulitis of unspecified part of limb: Secondary | ICD-10-CM

## 2022-12-30 DIAGNOSIS — Z5982 Transportation insecurity: Secondary | ICD-10-CM

## 2022-12-30 DIAGNOSIS — K760 Fatty (change of) liver, not elsewhere classified: Secondary | ICD-10-CM | POA: Diagnosis present

## 2022-12-30 DIAGNOSIS — Z87892 Personal history of anaphylaxis: Secondary | ICD-10-CM | POA: Diagnosis not present

## 2022-12-30 DIAGNOSIS — Z5941 Food insecurity: Secondary | ICD-10-CM

## 2022-12-30 DIAGNOSIS — F419 Anxiety disorder, unspecified: Secondary | ICD-10-CM | POA: Diagnosis present

## 2022-12-30 DIAGNOSIS — Z8616 Personal history of COVID-19: Secondary | ICD-10-CM

## 2022-12-30 DIAGNOSIS — Z5987 Material hardship due to limited financial resources, not elsewhere classified: Secondary | ICD-10-CM

## 2022-12-30 DIAGNOSIS — G4700 Insomnia, unspecified: Secondary | ICD-10-CM | POA: Diagnosis present

## 2022-12-30 DIAGNOSIS — F1510 Other stimulant abuse, uncomplicated: Secondary | ICD-10-CM | POA: Diagnosis present

## 2022-12-30 DIAGNOSIS — Z5900 Homelessness unspecified: Secondary | ICD-10-CM

## 2022-12-30 LAB — CBC
HCT: 35.6 % — ABNORMAL LOW (ref 39.0–52.0)
Hemoglobin: 11.9 g/dL — ABNORMAL LOW (ref 13.0–17.0)
MCH: 30.1 pg (ref 26.0–34.0)
MCHC: 33.4 g/dL (ref 30.0–36.0)
MCV: 90.1 fL (ref 80.0–100.0)
Platelets: 222 10*3/uL (ref 150–400)
RBC: 3.95 MIL/uL — ABNORMAL LOW (ref 4.22–5.81)
RDW: 13.1 % (ref 11.5–15.5)
WBC: 7.1 10*3/uL (ref 4.0–10.5)
nRBC: 0 % (ref 0.0–0.2)

## 2022-12-30 MED ORDER — ADULT MULTIVITAMIN W/MINERALS CH
1.0000 | ORAL_TABLET | Freq: Every day | ORAL | Status: DC
  Administered 2022-12-31 – 2023-01-22 (×16): 1 via ORAL
  Filled 2022-12-30 (×22): qty 1

## 2022-12-30 MED ORDER — LOSARTAN POTASSIUM 50 MG PO TABS
50.0000 mg | ORAL_TABLET | Freq: Every day | ORAL | Status: DC
  Administered 2022-12-30 – 2023-01-22 (×17): 50 mg via ORAL
  Filled 2022-12-30 (×24): qty 1

## 2022-12-30 MED ORDER — ACETAMINOPHEN 325 MG PO TABS
650.0000 mg | ORAL_TABLET | Freq: Four times a day (QID) | ORAL | Status: DC | PRN
  Administered 2023-01-04: 650 mg via ORAL
  Filled 2022-12-30: qty 2

## 2022-12-30 MED ORDER — THIAMINE MONONITRATE 100 MG PO TABS
100.0000 mg | ORAL_TABLET | Freq: Every day | ORAL | Status: DC
  Administered 2022-12-31 – 2023-01-22 (×16): 100 mg via ORAL
  Filled 2022-12-30 (×22): qty 1

## 2022-12-30 MED ORDER — DIPHENHYDRAMINE HCL 25 MG PO CAPS: 50.0000 mg | ORAL_CAPSULE | Freq: Three times a day (TID) | ORAL | Status: DC | PRN

## 2022-12-30 MED ORDER — METOPROLOL SUCCINATE ER 25 MG PO TB24
50.0000 mg | ORAL_TABLET | Freq: Every day | ORAL | Status: DC
  Administered 2022-12-30 – 2023-01-21 (×11): 50 mg via ORAL
  Filled 2022-12-30 (×21): qty 2

## 2022-12-30 MED ORDER — LORAZEPAM 2 MG PO TABS
2.0000 mg | ORAL_TABLET | Freq: Three times a day (TID) | ORAL | Status: DC | PRN
  Administered 2022-12-31 – 2023-01-01 (×2): 2 mg via ORAL
  Filled 2022-12-30 (×3): qty 1

## 2022-12-30 MED ORDER — FOLIC ACID 1 MG PO TABS
1.0000 mg | ORAL_TABLET | Freq: Every day | ORAL | Status: DC
  Administered 2022-12-31 – 2023-01-22 (×16): 1 mg via ORAL
  Filled 2022-12-30 (×22): qty 1

## 2022-12-30 MED ORDER — LORAZEPAM 2 MG/ML IJ SOLN: 2.0000 mg | Freq: Three times a day (TID) | INTRAMUSCULAR | Status: DC | PRN

## 2022-12-30 MED ORDER — ARIPIPRAZOLE 10 MG PO TABS
10.0000 mg | ORAL_TABLET | Freq: Every day | ORAL | Status: DC
  Administered 2022-12-31 – 2023-01-22 (×16): 10 mg via ORAL
  Filled 2022-12-30 (×23): qty 1

## 2022-12-30 MED ORDER — BENZONATATE 100 MG PO CAPS: 100.0000 mg | ORAL_CAPSULE | Freq: Three times a day (TID) | ORAL | 0 refills | Status: DC | PRN

## 2022-12-30 MED ORDER — HALOPERIDOL 5 MG PO TABS: 5.0000 mg | ORAL_TABLET | Freq: Three times a day (TID) | ORAL | Status: DC | PRN

## 2022-12-30 MED ORDER — CLONIDINE HCL 0.1 MG PO TABS: 0.2000 mg | ORAL_TABLET | Freq: Four times a day (QID) | ORAL | Status: DC | PRN

## 2022-12-30 MED ORDER — IPRATROPIUM BROMIDE 0.02 % IN SOLN
0.5000 mg | Freq: Four times a day (QID) | RESPIRATORY_TRACT | Status: DC
  Administered 2022-12-31: 0.5 mg via RESPIRATORY_TRACT
  Filled 2022-12-30 (×6): qty 2.5

## 2022-12-30 MED ORDER — ALUM & MAG HYDROXIDE-SIMETH 200-200-20 MG/5ML PO SUSP: 30.0000 mL | ORAL | Status: DC | PRN

## 2022-12-30 MED ORDER — HALOPERIDOL LACTATE 5 MG/ML IJ SOLN: 5.0000 mg | Freq: Three times a day (TID) | INTRAMUSCULAR | Status: DC | PRN

## 2022-12-30 MED ORDER — ATROVENT HFA 17 MCG/ACT IN AERS: 2.0000 | INHALATION_SPRAY | Freq: Four times a day (QID) | RESPIRATORY_TRACT | 12 refills | Status: DC | PRN

## 2022-12-30 MED ORDER — FLUTICASONE PROPIONATE 50 MCG/ACT NA SUSP
1.0000 | Freq: Every day | NASAL | Status: DC
  Administered 2022-12-31 – 2023-01-07 (×5): 1 via NASAL
  Filled 2022-12-30: qty 16

## 2022-12-30 MED ORDER — DIPHENHYDRAMINE HCL 50 MG/ML IJ SOLN: 50.0000 mg | Freq: Three times a day (TID) | INTRAMUSCULAR | Status: DC | PRN

## 2022-12-30 MED ORDER — MIRTAZAPINE 15 MG PO TABS
15.0000 mg | ORAL_TABLET | Freq: Every day | ORAL | Status: DC
  Administered 2022-12-31 – 2023-01-20 (×12): 15 mg via ORAL
  Filled 2022-12-30 (×18): qty 1

## 2022-12-30 MED ORDER — DIVALPROEX SODIUM ER 500 MG PO TB24
1000.0000 mg | ORAL_TABLET | Freq: Every day | ORAL | Status: DC
  Administered 2022-12-31 – 2023-01-14 (×12): 1000 mg via ORAL
  Filled 2022-12-30 (×17): qty 2

## 2022-12-30 MED ORDER — MAGNESIUM HYDROXIDE 400 MG/5ML PO SUSP: 30.0000 mL | Freq: Every day | ORAL | Status: DC | PRN

## 2022-12-30 NOTE — TOC Progression Note (Signed)
Transition of Care [facility]) - Progression Note    Patient Details  Name: Cory Floyd MRN: [id number] Date of Birth: Dec 05, 1975  Transition of Care [name]) CM/SW [place], [place] Phone Number: 12/30/2022, 10:05 AM  Clinical Narrative:      Pt faxed out in hub per request for surrounding [facility] facilities.       Expected Discharge Plan and Services                                               Social Determinants of Health (SDOH) Interventions SDOH Screenings   Food Insecurity: Food Insecurity Present (12/27/2022)  Housing: Medium Risk (12/27/2022)  Transportation Needs: Unmet Transportation Needs (12/27/2022)  Utilities: Not At Risk (12/27/2022)  Alcohol Screen: Low Risk  (05/02/2021)  Tobacco Use: High Risk (12/30/2022)    Readmission Risk Interventions     No data to display

## 2022-12-30 NOTE — Progress Notes (Signed)
     Daily Progress Note Intern Pager: [phone]  Patient name: Cory Floyd Medical record number: [id number] Date of birth: 1976/08/16 Age: 47 y.o. Gender: male  Primary Care Provider: Pcp, No Consultants: Ortho, Psychiatry Code Status: Full code   Pt Overview and Major Events to Date:  3/5: Admitted to FMTS   Assessment and Plan: Cory Floyd is a 47 y.o. male presenting with R arm pain c/w cellulitis +/- underlying abscess. PMHX includes polysubstance use and bipolar disorder.  * Cellulitis Bilateral AC bandaged, pain well controlled. S/p I&D of left and right AC abscess. Patient received Oritavancin for abx coverage x 7 days, no further abx needed. BC no growth @ 4 days. -Ortho signed off -Tylenol for pain prn  Bipolar 1 disorder [facility]) Patient medically cleared for inpatient psychiatric care pending bed placement. COVID swab negative. Willing to go to care and denies SI this AM. 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 Current smoker. Reports improvement in symptoms with Duonebs but causes tremulousness. Will likely need outpatient PFTs. -Switch to Atrovent BID -Benzonatate prn -Flonase  Polysubstance dependence (HCC) Hep C RNA negative. Patient education on safe needle use.   FEN/GI: Regular PPx: Lovenox Dispo: Cleared for inpatient psych pending bed placement  Subjective:  Patient assessed at bedside, resting comfortably. Has some discomfort in R arm but states he overall feels good. States his breathing has improved with the treatment. Reports his mood is "exhausted" but denies SI.  Medically stable for inpatient psychiatric treatment.  Objective: Temp:  [97.6 F (36.4 C)-98.6 F (37 C)] 97.7 F (36.5 C) (03/09 0800) Pulse Rate:  [77-98] 77 (03/09 0800) Resp:  [16-24] 24 (03/09 0800) BP:  (101-132)/(56-83) 126/63 (03/09 0800) SpO2:  [90 %-100 %] 91 % (03/09 0800) Physical Exam: General: Laying on side in bed, alert Cardiovascular: RRR, no murmur Respiratory: Normal WOB on RA. Intermittent cough. No wheezing noted Gastrointestinal: Soft, non-tender, non-distending MSK: Bilateral AC wrapped with clean,d ry ACE bandage. 2+ radial pulse bilaterally. No erythema or edema noted. Psych: Cooperative and calm. Denies SI  Laboratory: Most recent CBC Lab Results  Component Value Date   WBC 7.1 12/30/2022   HGB 11.9 (L) 12/30/2022   HCT 35.6 (L) 12/30/2022   MCV 90.1 12/30/2022   PLT 222 12/30/2022   Most recent BMP    Latest Ref Rng & Units 12/29/2022    8:12 AM  BMP  Glucose 70 - 99 mg/dL 95   BUN 6 - 20 mg/dL 10   Creatinine 0.61 - 1.24 mg/dL 0.78   Sodium 135 - 145 mmol/L 137   Potassium 3.5 - 5.1 mmol/L 3.9   Chloride 98 - 111 mmol/L 102   CO2 22 - 32 mmol/L 25   Calcium 8.9 - 10.3 mg/dL 8.8     Other pertinent labs: BC no growth @ 4 days  Colletta Maryland, MD 12/30/2022, 9:29 AM  PGY-1, [place] Intern pager: [phone], text pages welcome Secure chat group [place]

## 2022-12-30 NOTE — Tx Team (Signed)
Initial Treatment Plan 12/30/2022 5:41 PM CHED NEDROW [id number]    PATIENT STRESSORS: Financial difficulties   Health problems   Legal issue   Medication change or noncompliance   Substance abuse     PATIENT STRENGTHS: Ability for insight  Average or above average intelligence  Communication skills    PATIENT IDENTIFIED PROBLEMS: Substance use disorder  Legal  Housing  Financial  Bilateral arm wounds/ potential for infection              DISCHARGE CRITERIA:  Adequate post-discharge living arrangements Improved stabilization in mood, thinking, and/or behavior Medical problems require only outpatient monitoring Motivation to continue treatment in a less acute level of care Verbal commitment to aftercare and medication compliance  PRELIMINARY DISCHARGE PLAN: Attend PHP/IOP Attend 12-step recovery group Outpatient therapy Placement in alternative living arrangements  PATIENT/FAMILY INVOLVEMENT: This treatment plan has been presented to and reviewed with the patient, DAHL LITTERIO.  The patient and family have been given the opportunity to ask questions and make suggestions.  Earvin Hansen, RN 12/30/2022, 5:41 PM
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
## 2022-12-30 NOTE — Progress Notes (Signed)
   ORTHOPAEDIC PROGRESS NOTE  s/p Procedure(s): INCISION AND DRAINAGE ABSCESS BILATERAL ARMS on 12/29/22 with Dr. Doreatha Martin  SUBJECTIVE: Reports moderate pain about operative site. Right side more sore than the left.  No chest pain. No SOB. No nausea/vomiting. No other complaints.  OBJECTIVE: PE: RUE: dressing CDI. ROM of right elbow from about 0-110 degrees. + Motor in  AIN, PIN, Ulnar distributions. Sensation intact in medial, radial, and ulnar distributions. Well perfused digits.  LUE: dressing CDI. ROM of left elbow near full. + Motor in  AIN, PIN, Ulnar distributions. Sensation intact in medial, radial, and ulnar distributions. Well perfused digits.    Vitals:   12/30/22 0453 12/30/22 0800  BP: 121/66 126/63  Pulse: 83 77  Resp: 20 (!) 24  Temp: 97.9 F (36.6 C) 97.7 F (36.5 C)  SpO2:  91%     ASSESSMENT: Cory Floyd is a 47 y.o. male POD#1  PLAN: Weightbearing: WBAT RUE and LUE Insicional and dressing care: Reinforce dressings as needed Orthopedic device(s): None Showering: Hold for now VTE prophylaxis: per primary team. No orthopedic contraindications Pain control: Minimize narcotics as able.  Follow - up plan: 2 weeks in office with Dr. Doreatha Martin Dispo: okay to discharge once cleared by medicine teams Contact information: After hours and holidays please check [email or web address] for group call information for [facility]   Noemi Chapel, [name] 12/30/2022

## 2022-12-30 NOTE — Progress Notes (Signed)
Received call from safe transport that they were outside with the patient. Patient was to be admitted to [facility] from [facility] [place]. Report was not called and [facility] staff did not communicate with BMU that patient transport had left [facility] and patient showed up to [facility] BMU with no communication.

## 2022-12-30 NOTE — TOC Transition Note (Signed)
Transition of Care [facility]) - CM/SW Discharge Note   Patient Details  Name: Cory Floyd MRN: [id number] Date of Birth: 08-23-1976  [facility] of [facility] CM/SW Contact:  Ina Homes, [place] Phone Number: 12/30/2022, 2:40 PM   Clinical Narrative:     Pt d/c to [facility] via [facility] Georgina Snell [phone])  Final next level of care: Psychiatric Hospital Barriers to Discharge: Barriers Resolved   Patient Goals and CMS Choice      Discharge Placement                  Patient to be transferred to facility by: [facility]   Patient and family notified of of transfer: 12/30/22  Discharge Plan and Services Additional resources added to the After Visit Summary for                                       Social Determinants of Health (SDOH) Interventions SDOH Screenings   Food Insecurity: Food Insecurity Present (12/27/2022)  Housing: Medium Risk (12/27/2022)  Transportation Needs: Unmet Transportation Needs (12/27/2022)  Utilities: Not At Risk (12/27/2022)  Alcohol Screen: Low Risk  (05/02/2021)  Tobacco Use: High Risk (12/30/2022)     Readmission Risk Interventions     No data to display

## 2022-12-30 NOTE — Progress Notes (Signed)
Patient states that he will take scheduled Depakote later. So, this writer moved dose to 2000.

## 2022-12-30 NOTE — Progress Notes (Signed)
Admission note:  Consents signed, handbook detailing the patient's rights, responsibilities, and visitor guidelines provided. Skin search with tattoos noted to arms and chest bilaterally. Surgical dressings to arms bilaterally, dry and intact. Unremarkable integument otherwise. Belongings search with no contraband found. His VS were elevated and NOW medications given per MD order (see MAR). He was oriented to his room and the unit. Patient given the opportunity to express concerns and ask questions. Will continue to closely monitor VS.

## 2022-12-31 DIAGNOSIS — F319 Bipolar disorder, unspecified: Secondary | ICD-10-CM

## 2022-12-31 LAB — CULTURE, BLOOD (ROUTINE X 2)
Culture: NO GROWTH
Culture: NO GROWTH
Special Requests: ADEQUATE
Special Requests: ADEQUATE

## 2022-12-31 LAB — VALPROIC ACID LEVEL: Valproic Acid Lvl: 36 ug/mL — ABNORMAL LOW (ref 50.0–100.0)

## 2022-12-31 LAB — LIPID PANEL
Cholesterol: 149 mg/dL (ref 0–200)
HDL: 26 mg/dL — ABNORMAL LOW (ref >40)
LDL Cholesterol: 91 mg/dL (ref 0–99)
Total CHOL/HDL Ratio: 5.7 RATIO
Triglycerides: 160 mg/dL — ABNORMAL HIGH (ref <150)
VLDL: 32 mg/dL (ref 0–40)

## 2022-12-31 MED ORDER — TRAZODONE HCL 50 MG PO TABS
150.0000 mg | ORAL_TABLET | Freq: Every evening | ORAL | Status: DC | PRN
  Administered 2023-01-01 – 2023-01-20 (×10): 150 mg via ORAL
  Filled 2022-12-31 (×10): qty 1

## 2022-12-31 MED ORDER — IPRATROPIUM BROMIDE 0.02 % IN SOLN
0.5000 mg | Freq: Four times a day (QID) | RESPIRATORY_TRACT | Status: DC
  Administered 2023-01-01: 0.5 mg via RESPIRATORY_TRACT

## 2022-12-31 MED ORDER — IPRATROPIUM BROMIDE HFA 17 MCG/ACT IN AERS
2.0000 | INHALATION_SPRAY | Freq: Four times a day (QID) | RESPIRATORY_TRACT | Status: DC
  Administered 2022-12-31: 2 via RESPIRATORY_TRACT
  Filled 2022-12-31: qty 12.9

## 2022-12-31 NOTE — Progress Notes (Signed)
Patient calm and cooperative during assessment denying SI/HI/AVH. Pt observed interacting appropriately with staff and peers on the unit. Pt compliant with medication administration per MD orders. Pt given education, support, and encouragement to be active in his treatment plan. Pt being monitored Q 15 minutes for safety per unit protocol, remains safe on the unit

## 2022-12-31 NOTE — Progress Notes (Signed)
CSW attempted to meet with pt for PSA.  Pt in bed, says he "needs to catch up", can't meet right now, asked if CSW could return after lunch. Lurline Idol, MSW, LCSW 3/10/202411:16 AM

## 2022-12-31 NOTE — Plan of Care (Signed)

## 2022-12-31 NOTE — H&P (Signed)
Psychiatric Admission Assessment Adult  Patient Identification: Cory Floyd MRN:  [id number] Date of Evaluation:  12/31/2022 Chief Complaint:  Bipolar 1 disorder ([place]) [F31.9] Principal Diagnosis: Bipolar 1 disorder ([place]) Diagnosis:  Principal Problem:   Bipolar 1 disorder ([place])  History of Present Illness: Cory Floyd is a 47 year old white male who was using IV drugs and developed a right arm abscess that needed surgery.  He was seen on psychiatric consultation which recommended inpatient psychiatry.  He denies being suicidal.  He denies any auditory or visual hallucinations.  Cory Floyd is a 47 y.o. male admitted medically for 12/26/2022  7:08 PM for bilateral upper extremity cellulitis. He carries the psychiatric diagnoses of bipolar disorder and multiple substance use disorders (methamphetamines, cocaine, alcohol) and has a past medical history of fatty liver, arthritis, hypertension, DM .  Associated Signs/Symptoms: Depression Symptoms:  insomnia, anxiety, disturbed sleep, (Hypo) Manic Symptoms:   None Anxiety Symptoms:  Social Anxiety, Psychotic Symptoms:   Denied PTSD Symptoms: NA Total Time spent with patient: 1 hour  Past Psychiatric History: Yes  Is the patient at risk to self? No.  Has the patient been a risk to self in the past 6 months? No.  Has the patient been a risk to self within the distant past? No.  Is the patient a risk to others? No.  Has the patient been a risk to others in the past 6 months? No.  Has the patient been a risk to others within the distant past? No.   [place] Scale:  [place] Admission (Current) from 12/30/2022 in [place] ED to Hosp-Admission (Discharged) from 12/26/2022 in [facility] ED from 12/19/2022 in [facility] Emergency Department at [place] No Risk No Risk No Risk        Prior Inpatient Therapy: Yes.   If yes, describe [facility] Prior Outpatient  Therapy: Yes.   If yes, describe unknown  Alcohol Screening: 1. How often do you have a drink containing alcohol?: Never 2. How many drinks containing alcohol do you have on a typical day when you are drinking?: 1 or 2 3. How often do you have six or more drinks on one occasion?: Never AUDIT-C Score: 0 4. How often during the last year have you found that you were not able to stop drinking once you had started?: Never 5. How often during the last year have you failed to do what was normally expected from you because of drinking?: Never 6. How often during the last year have you needed a first drink in the morning to get yourself going after a heavy drinking session?: Never 7. How often during the last year have you had a feeling of guilt of remorse after drinking?: Never 8. How often during the last year have you been unable to remember what happened the night before because you had been drinking?: Never 9. Have you or someone else been injured as a result of your drinking?: No 10. Has a relative or friend or a doctor or another health worker been concerned about your drinking or suggested you cut down?: No Alcohol Use Disorder Identification Test Final Score (AUDIT): 0 Alcohol Brief Interventions/Follow-up: Alcohol education/Brief advice Substance Abuse History in the last 12 months:  Yes.   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   Anginal pain ([place])    Arthritis    Bipolar 1 disorder ([place])    COVID-19    Depressed    Diabetes mellitus without complication ([place])    Dyspnea    Fatty liver    Hypertension    Schizophrenia ([place])     Past Surgical History:  Procedure Laterality Date   COLONOSCOPY  02/05/2014   diverticulosis, hyperplastic polyp x 2   fracture arm Right    INCISION AND  DRAINAGE ABSCESS Bilateral 12/29/2022   Procedure: INCISION AND DRAINAGE ABSCESS BILATERAL ARMS;  Surgeon: Shona Needles, MD;  Location: [facility];  Service: Orthopedics;  Laterality: Bilateral;   KNEE SURGERY Right 2013   NASAL SEPTUM SURGERY     NASAL TURBINATE REDUCTION Bilateral 12/12/2016   Procedure: TURBINATE REDUCTION/SUBMUCOSAL RESECTION;  Surgeon: Beverly Gust, MD;  Location: [facility];  Service: ENT;  Laterality: Bilateral;   Family History:  Family History  Problem Relation Age of Onset   Hepatitis C Mother    Cancer Mother    Cancer Maternal Aunt    Hypertension Maternal Grandmother    Family Psychiatric  History: Unremarkable Tobacco Screening:  Social History   Tobacco Use  Smoking Status Every Day   Packs/day: 1.00   Years: 20.00   Total pack years: 20.00   Types: Cigarettes  Smokeless Tobacco Never    BH Tobacco Counseling     Are you interested in Tobacco Cessation Medications?  No, patient refused Counseled patient on smoking cessation:  Yes Reason Tobacco Screening Not Completed: No value filed.       Social History:  Social History   Substance and Sexual Activity  Alcohol Use Yes   Comment: 1/2-5th pint of liquor     Social History   Substance and Sexual Activity  Drug Use Yes   Types: Cocaine, Marijuana   Comment: heroin    Additional Social History:                           Allergies:   Allergies  Allergen Reactions   Carrot [Daucus [place]] Anaphylaxis and Rash   Penicillins Anaphylaxis and Hives    Has patient had a PCN reaction causing immediate rash, facial/tongue/throat swelling, SOB or lightheadedness with hypotension: Yes Has patient had a PCN reaction causing severe rash involving mucus membranes or skin necrosis: No Has patient had a PCN reaction that required hospitalization Yes Has patient had a PCN reaction occurring within the last 10 years: No If all of the above answers are "NO", then may proceed with  Cephalosporin use.    [place] [Ampicillin] Anaphylaxis and Hives   Lab Results:  Results for orders placed or performed during the hospital encounter of 12/26/22 (from the past 48 hour(s))  Aerobic/Anaerobic Culture w Gram Stain (surgical/deep wound)     Status: None (Preliminary result)   Collection Time: 12/29/22 10:39 AM   Specimen: PATH Other; Tissue  Result Value Ref Range   Specimen Description ABSCESS    Special Requests  LEFT ARM    Gram Stain NO WBC SEEN NO ORGANISMS SEEN     Culture      NO GROWTH 1 DAY Performed at [facility], [address], [place], [place] [address]    Report Status PENDING   Aerobic/Anaerobic Culture w Gram Stain (surgical/deep wound)     Status: None (Preliminary result)   Collection Time: 12/29/22 10:41 AM  Specimen: PATH Other; Tissue  Result Value Ref Range   Specimen Description ABSCESS    Special Requests  RIGHT ARM    Gram Stain NO WBC SEEN NO ORGANISMS SEEN     Culture      NO GROWTH 1 DAY Performed at [facility], [address], [place], [place] [address]    Report Status PENDING   SARS Coronavirus 2 by RT PCR (hospital order, performed in [facility] hospital lab) *cepheid single result test* Anterior Nasal Swab     Status: None   Collection Time: 12/29/22 10:47 AM   Specimen: Anterior Nasal Swab  Result Value Ref Range   SARS Coronavirus 2 by RT PCR NEGATIVE NEGATIVE    Comment: Performed at [facility], [address], [place], [place] [address]  CBC     Status: Abnormal   Collection Time: 12/30/22  3:21 AM  Result Value Ref Range   WBC 7.1 4.0 - 10.5 K/uL   RBC 3.95 (L) 4.22 - 5.81 MIL/uL   Hemoglobin 11.9 (L) 13.0 - 17.0 g/dL   HCT 35.6 (L) 39.0 - 52.0 %   MCV 90.1 80.0 - 100.0 fL   MCH 30.1 26.0 - 34.0 pg   MCHC 33.4 30.0 - 36.0 g/dL   RDW 13.1 11.5 - 15.5 %   Platelets 222 150 - 400 K/uL   nRBC 0.0 0.0 - 0.2 %    Comment: Performed at [facility], [place] [address], [place], [place]  [address]    Blood Alcohol level:  Lab Results  Component Value Date   [facility] <10 12/19/2022   ETH <10 [id number]    Metabolic Disorder Labs:  Lab Results  Component Value Date   HGBA1C 5.5 04/02/2021   MPG 111 04/02/2021   MPG 119.76 01/23/2021   No results found for: "PROLACTIN" Lab Results  Component Value Date   CHOL 195 05/04/2021   TRIG 75 05/04/2021   HDL 58 05/04/2021   CHOLHDL 3.4 05/04/2021   VLDL 15 05/04/2021   LDLCALC 122 (H) 05/04/2021   LDLCALC 81 04/02/2021    Current Medications: Current Facility-Administered Medications  Medication Dose Route Frequency Provider Last Rate Last Admin   acetaminophen (TYLENOL) tablet 650 mg  650 mg Oral Q6H PRN [name], [name]       alum & mag hydroxide-simeth (MAALOX/MYLANTA) 200-200-20 MG/5ML suspension 30 mL  30 mL Oral Q4H PRN [name], [name]       ARIPiprazole (ABILIFY) tablet 10 mg  10 mg Oral Daily [name], [name]       cloNIDine (CATAPRES) tablet 0.2 mg  0.2 mg Oral Q6H PRN [name], DO       diphenhydrAMINE (BENADRYL) capsule 50 mg  50 mg Oral TID PRN [name], [name]       Or   diphenhydrAMINE (BENADRYL) injection 50 mg  50 mg Intramuscular TID PRN [name], [name]       divalproex (DEPAKOTE ER) 24 hr tablet 1,000 mg  1,000 mg Oral Daily [name], [name]       fluticasone (FLONASE) 50 MCG/ACT nasal spray 1 spray  1 spray Each Nare Daily [name], [name]       folic acid (FOLVITE) tablet 1 mg  1 mg Oral Daily [name], [name]       haloperidol (HALDOL) tablet 5 mg  5 mg Oral TID PRN [name], [name]       Or   haloperidol lactate (  HALDOL) injection 5 mg  5 mg Intramuscular TID PRN [name], [name]       ipratropium (ATROVENT) nebulizer solution 0.5 mg  0.5 mg Nebulization Q6H [name], [name]       LORazepam (ATIVAN) tablet 2 mg  2 mg Oral TID PRN [name], [name]       Or   LORazepam (ATIVAN) injection 2 mg  2 mg Intramuscular  TID PRN [name], [name]       losartan (COZAAR) tablet 50 mg  50 mg Oral Daily [name], DO   50 mg at 12/30/22 1720   magnesium hydroxide (MILK OF MAGNESIA) suspension 30 mL  30 mL Oral Daily PRN [name], [name]       metoprolol succinate (TOPROL-XL) 24 hr tablet 50 mg  50 mg Oral Daily [name], DO   50 mg at 12/30/22 1720   mirtazapine (REMERON) tablet 15 mg  15 mg Oral QHS [name], [name]       multivitamin with minerals tablet 1 tablet  1 tablet Oral Daily [name], [name]       thiamine (VITAMIN B1) tablet 100 mg  100 mg Oral Daily [name], [name]       traZODone (DESYREL) tablet 150 mg  150 mg Oral QHS PRN [name], DO       PTA Medications: Medications Prior to Admission  Medication Sig Dispense Refill Last Dose   ARIPiprazole (ABILIFY) 10 MG tablet Take 1 tablet (10 mg total) by mouth daily. (Patient not taking: Reported on 12/27/2022) 30 tablet 0 Not Taking   benzonatate (TESSALON) 100 MG capsule Take 1 capsule (100 mg total) by mouth 3 (three) times daily as needed for cough. (Patient not taking: Reported on 12/31/2022) 20 capsule 0 Not Taking   divalproex (DEPAKOTE ER) 500 MG 24 hr tablet Take 2 tablets (1,000 mg total) by mouth at bedtime. (Patient not taking: Reported on 12/27/2022) 60 tablet 0 Not Taking   ipratropium (ATROVENT HFA) 17 MCG/ACT inhaler Inhale 2 puffs into the lungs every 6 (six) hours as needed for wheezing. (Patient not taking: Reported on 12/31/2022) 1 each 12 Not Taking   mirtazapine (REMERON) 15 MG tablet Take 1 tablet (15 mg total) by mouth at bedtime. For depression/sleep (Patient not taking: Reported on 12/27/2022) 30 tablet 0 Not Taking    Musculoskeletal: Strength & Muscle Tone: within normal limits Gait & Station: normal Patient leans: N/A            Psychiatric Specialty Exam:  Presentation  General Appearance:  Appropriate for Environment  Eye  Contact: Good  Speech: Clear and Coherent  Speech Volume: Normal  Handedness: -- (unknown)   Mood and Affect  Mood: Depressed  Affect: Congruent   Thought Process  Thought Processes: Coherent; Goal Directed; Linear  Duration of Psychotic Symptoms:N/A Past Diagnosis of Schizophrenia or Psychoactive disorder: No data recorded Descriptions of Associations:Intact  Orientation:Full (Time, Place and Person)  Thought Content:Logical  Hallucinations:Hallucinations: Auditory; Visual  Ideas of Reference:None  Suicidal Thoughts:Suicidal Thoughts: Yes, Passive SI Passive Intent and/or Plan: Without Intent  Homicidal Thoughts:Homicidal Thoughts: No   Sensorium  Memory: Immediate Fair; Recent Fair  Judgment: Fair  Insight: Good   Executive Functions  Concentration: Good  Attention Span: Good  Recall: Good  Fund of Knowledge: Good  Language: Good   Psychomotor Activity  Psychomotor Activity: Psychomotor Activity: Normal   Assets  Assets: Communication Skills; Leisure Time; Resilience   Sleep  Sleep: Sleep:  Fair    Physical Exam: Physical Exam Vitals and nursing note reviewed.  Constitutional:      Appearance: Normal appearance. He is normal weight.  HENT:     Head: Normocephalic and atraumatic.     Nose: Nose normal.     Mouth/Throat:     Pharynx: Oropharynx is clear.  Eyes:     Extraocular Movements: Extraocular movements intact.     Pupils: Pupils are equal, round, and reactive to light.  Cardiovascular:     Rate and Rhythm: Normal rate and regular rhythm.     Pulses: Normal pulses.     Heart sounds: Normal heart sounds.  Pulmonary:     Effort: Pulmonary effort is normal.     Breath sounds: Normal breath sounds.  Abdominal:     General: Abdomen is flat. Bowel sounds are normal.     Palpations: Abdomen is soft.  Genitourinary:    Penis: Normal.      Testes: Normal.     Rectum: Normal.  Musculoskeletal:         General: Normal range of motion.     Cervical back: Normal range of motion and neck supple.  Skin:    General: Skin is warm and dry.  Neurological:     General: No focal deficit present.     Mental Status: He is alert and oriented to person, place, and time.  Psychiatric:        Attention and Perception: Attention and perception normal.        Mood and Affect: Mood and affect normal.        Speech: Speech normal.        Behavior: Behavior normal. Behavior is cooperative.        Thought Content: Thought content normal.        Cognition and Memory: Cognition and memory normal.        Judgment: Judgment is impulsive and inappropriate.    Review of Systems  Constitutional: Negative.   HENT: Negative.    Eyes: Negative.   Respiratory: Negative.    Cardiovascular: Negative.   Gastrointestinal: Negative.   Genitourinary: Negative.   Musculoskeletal: Negative.   Skin: Negative.   Neurological: Negative.   Endo/Heme/Allergies: Negative.   Psychiatric/Behavioral:  The patient has insomnia.    Blood pressure 116/83, pulse 85, temperature 98 F (36.7 C), temperature source Oral, resp. rate 18, height '5\' 10"'$  (1.778 m), weight 97.5 kg, SpO2 97 %. Body mass index is 30.85 kg/m.  Treatment Plan Summary: Daily contact with patient to assess and evaluate symptoms and progress in treatment, Medication management, and Plan see orders medications of already been started start trazodone 150 mg at bedtime as needed for sleep.  Observation Level/Precautions:  15 minute checks  Laboratory:  CBC Chemistry Profile  Psychotherapy:    Medications:    Consultations:    Discharge Concerns:    Estimated LOS:  Other:     Physician Treatment Plan for Primary Diagnosis: Bipolar 1 disorder ([place]) Long Term Goal(s): Improvement in symptoms so as ready for discharge  Short Term Goals: Ability to identify changes in lifestyle to reduce recurrence of condition will improve, Ability to verbalize feelings  will improve, Ability to disclose and discuss suicidal ideas, Ability to demonstrate self-control will improve, Ability to identify and develop effective coping behaviors will improve, Ability to maintain clinical measurements within normal limits will improve, Compliance with prescribed medications will improve, and Ability to identify triggers associated with substance abuse/mental health issues will improve  Physician Treatment Plan for Secondary Diagnosis: Principal Problem:   Bipolar 1 disorder ([place])    I certify that inpatient services furnished can reasonably be expected to improve the patient's condition.    [name], DO 3/10/202410:46 AM

## 2022-12-31 NOTE — Progress Notes (Signed)
D- Patient alert, oriented and irritable Affect blunted/mood irritable. Denies SI/ HI/ AVH. He denies pain. He isolated in his room during the shift except for meals. He took his wound dressings off and refuses to replace- sates "I am going to take a shower first". Bilateral medial anti cubital area surgical wounds with sutures dry and intact. No drainage or signs or symptoms of infection noted. A- Scheduled medications administered to patient, per MD orders.Nebulized treatment administered by respiratory therapist. Support and encouragement provided.  Routine safety checks conducted every 15 minutes.  Patient informed to notify staff with problems or concerns and verbalizes understanding.[name] R- No adverse drug reactions noted. Patient compliant with medications and treatment plan. Patient receptive, calm, and cooperative. Patient contracts for safety and  remains safe on the unit at this time.

## 2022-12-31 NOTE — Progress Notes (Signed)
CSW attempted to complete PSA with pt.  Pt initially cooperative but became more irritable as CSW asked questions, eventually cursing, refusing to answer, responding with: "I don't know and I don't care."  Refused to continue and left.  PSA questions partially completed.   Lurline Idol, MSW, LCSW 3/10/20243:14 PM

## 2022-12-31 NOTE — BHH Suicide Risk Assessment (Signed)
[facility] Admission Suicide Risk Assessment   Nursing information obtained from:  Patient Demographic factors:  Male, Caucasian, Low socioeconomic status, Unemployed Current Mental Status:  Suicidal ideation indicated by others Loss Factors:  Decline in physical health, Financial problems / change in socioeconomic status Historical Factors:  NA Risk Reduction Factors:  NA  Total Time spent with patient: 1 hour Principal Problem: Bipolar 1 disorder ([place]) Diagnosis:  Principal Problem:   Bipolar 1 disorder ([place])  Subjective Data: Cory Floyd is a 47 y.o. male admitted medically for 12/26/2022  7:08 PM for bilateral upper extremity cellulitis. He carries the psychiatric diagnoses of bipolar disorder and multiple substance use disorders (methamphetamines, cocaine, alcohol) and has a past medical history of fatty liver, arthritis, hypertension, DM .Psychiatry was consulted for suicidal ideation by Colletta [place], MD.    Continued Clinical Symptoms:  Alcohol Use Disorder Identification Test Final Score (AUDIT): 0 The "Alcohol Use Disorders Identification Test", Guidelines for Use in Primary Care, Second Edition.  World Pharmacologist [facility]). Score between 0-7:  no or low risk or alcohol related problems. Score between 8-15:  moderate risk of alcohol related problems. Score between 16-19:  high risk of alcohol related problems. Score 20 or above:  warrants further diagnostic evaluation for alcohol dependence and treatment.   CLINICAL FACTORS:   Alcohol/Substance Abuse/Dependencies   Musculoskeletal: Strength & Muscle Tone: within normal limits Gait & Station: normal Patient leans: N/A  Psychiatric Specialty Exam:  Presentation  General Appearance:  Appropriate for Environment  Eye Contact: Good  Speech: Clear and Coherent  Speech Volume: Normal  Handedness: -- (unknown)   Mood and Affect  Mood: Depressed  Affect: Congruent   Thought Process  Thought  Processes: Coherent; Goal Directed; Linear  Descriptions of Associations:Intact  Orientation:Full (Time, Place and Person)  Thought Content:Logical  History of Schizophrenia/Schizoaffective disorder:No data recorded Duration of Psychotic Symptoms:No data recorded Hallucinations:Hallucinations: Auditory; Visual  Ideas of Reference:None  Suicidal Thoughts:Suicidal Thoughts: Yes, Passive SI Passive Intent and/or Plan: Without Intent  Homicidal Thoughts:Homicidal Thoughts: No   Sensorium  Memory: Immediate Fair; Recent Fair  Judgment: Fair  Insight: Good   Executive Functions  Concentration: Good  Attention Span: Good  Recall: Good  Fund of Knowledge: Good  Language: Good   Psychomotor Activity  Psychomotor Activity: Psychomotor Activity: Normal   Assets  Assets: Communication Skills; Leisure Time; Resilience   Sleep  Sleep: Sleep: Fair     Blood pressure 116/83, pulse 85, temperature 98 F (36.7 C), temperature source Oral, resp. rate 18, height '5\' 10"'$  (1.778 m), weight 97.5 kg, SpO2 97 %. Body mass index is 30.85 kg/m.   COGNITIVE FEATURES THAT CONTRIBUTE TO RISK:  None    SUICIDE RISK:   Minimal: No identifiable suicidal ideation.  Patients presenting with no risk factors but with morbid ruminations; may be classified as minimal risk based on the severity of the depressive symptoms  PLAN OF CARE: See orders  I certify that inpatient services furnished can reasonably be expected to improve the patient's condition.   [name], DO 12/31/2022, 10:45 AM

## 2022-12-31 NOTE — BHH Group Notes (Signed)
LCSW Wellness Group Note   12/31/2022 12:45pm  Type of Group and Topic: Psychoeducational Group:  Wellness  Participation Level:  did not attend  Description of Group  Wellness group introduces the topic and its focus on developing healthy habits across the spectrum and its relationship to a decrease in hospital admissions.  Six areas of wellness are discussed: physical, social spiritual, intellectual, occupational, and emotional.  Patients are asked to consider their current wellness habits and to identify areas of wellness where they are interested and able to focus on improvements.    Therapeutic Goals Patients will understand components of wellness and how they can positively impact overall health.  Patients will identify areas of wellness where they have developed good habits. Patients will identify areas of wellness where they would like to make improvements.    Summary of Patient Progress     Therapeutic Modalities: Cognitive Behavioral Therapy Psychoeducation    Joanne Chars, LCSW

## 2022-12-31 NOTE — BHH Group Notes (Signed)
[facility] Group Notes:  (Nursing/MHT/Case Management/Adjunct)  Date:  12/31/2022  Time:  7:12 PM  Type of Therapy:  Psychoeducational Skills  Participation Level:  Did Not Attend   Adela Lank [facility] 12/31/2022, 7:12 PM

## 2023-01-01 LAB — HEMOGLOBIN A1C
Hgb A1c MFr Bld: 5.8 % — ABNORMAL HIGH (ref 4.8–5.6)
Mean Plasma Glucose: 120 mg/dL

## 2023-01-01 MED ORDER — IPRATROPIUM BROMIDE 0.02 % IN SOLN
0.5000 mg | Freq: Two times a day (BID) | RESPIRATORY_TRACT | Status: DC
  Filled 2023-01-01: qty 2.5

## 2023-01-01 MED ORDER — IBUPROFEN 600 MG PO TABS: 600.0000 mg | ORAL_TABLET | Freq: Four times a day (QID) | ORAL | Status: DC | PRN

## 2023-01-01 NOTE — Progress Notes (Signed)
[facility] MD Progress Note  01/01/2023 2:19 PM Cory Floyd  MRN:  [id number] Subjective: Patient seen in treatment team and spoken to individually today.  47 year old man well-known to our service with a long history of substance abuse involving multiple substances along with chronic mood instability and a diagnosis of bipolar disorder with frequent suicidal ideation.  Patient came to treatment team and told us that his concerns were "living situation, living situation, living situation".  He has no place to stay right now and being on the sex offender list little hope of finding any place to go.  Denies any current suicidal thoughts.  Describes his mood as being "someway".  Denies suicidal intent.  Denies hallucinations. 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   Anginal pain ([place])    Arthritis    Bipolar 1 disorder ([place])    COVID-19    Depressed    Diabetes mellitus without complication ([place])    Dyspnea    Fatty liver    Hypertension    Schizophrenia ([place])     Past Surgical History:  Procedure Laterality Date   COLONOSCOPY  02/05/2014   diverticulosis, hyperplastic polyp x 2   fracture arm Right    INCISION AND DRAINAGE ABSCESS Bilateral 12/29/2022   Procedure: INCISION AND DRAINAGE ABSCESS BILATERAL ARMS;  Surgeon: Shona Needles, MD;  Location: [place];  Service: Orthopedics;  Laterality: Bilateral;   KNEE SURGERY Right 2013   NASAL SEPTUM SURGERY     NASAL TURBINATE REDUCTION Bilateral 12/12/2016   Procedure: TURBINATE  REDUCTION/SUBMUCOSAL RESECTION;  Surgeon: Beverly Gust, MD;  Location: [facility];  Service: ENT;  Laterality: Bilateral;   Family History:  Family History  Problem Relation Age of Onset   Hepatitis C Mother    Cancer Mother    Cancer Maternal Aunt    Hypertension Maternal Grandmother    Family Psychiatric  History: See previous Social History:  Social History   Substance and Sexual Activity  Alcohol Use Yes   Comment: 1/2-5th pint of liquor     Social History   Substance and Sexual Activity  Drug Use Yes   Types: Cocaine, Marijuana   Comment: heroin    Social History   Socioeconomic History   Marital status: Divorced    Spouse name: Not on file   Number of children: Not on file   Years of education: Not on file   Highest education level: Not on file  Occupational History   Not on file  Tobacco Use   Smoking status: Every Day    Packs/day: 1.00    Years: 20.00    Total pack years: 20.00    Types: Cigarettes   Smokeless tobacco: Never  Vaping Use   Vaping Use: Never used  Substance and Sexual Activity   Alcohol use: Yes    Comment: 1/2-5th pint of liquor   Drug use: Yes    Types: Cocaine, Marijuana    Comment: heroin   Sexual activity: Yes    Birth control/protection: None  Other Topics Concern   Not  on file  Social History Narrative   Not on file   Social Determinants of Health   Financial Resource Strain: Not on file  Food Insecurity: Food Insecurity Present (12/30/2022)   Hunger Vital Sign    Worried About Running Out of Food in the Last Year: Often true    Ran Out of Food in the Last Year: Often true  Transportation Needs: Unmet Transportation Needs (12/30/2022)   PRAPARE - Hydrologist (Medical): Yes    Lack of Transportation (Non-Medical): Yes  Physical Activity: Not on file  Stress: Not on file  Social Connections: Not on file   Additional Social History:                         Sleep:  Fair  Appetite:  Fair  Current Medications: Current Facility-Administered Medications  Medication Dose Route Frequency Provider Last Rate Last Admin   acetaminophen (TYLENOL) tablet 650 mg  650 mg Oral Q6H PRN [name], [name]       alum & mag hydroxide-simeth (MAALOX/MYLANTA) 200-200-20 MG/5ML suspension 30 mL  30 mL Oral Q4H PRN [name], [name]       ARIPiprazole (ABILIFY) tablet 10 mg  10 mg Oral Daily [name], [name]   10 mg at 12/31/22 1227   cloNIDine (CATAPRES) tablet 0.2 mg  0.2 mg Oral Q6H PRN [name], DO       diphenhydrAMINE (BENADRYL) capsule 50 mg  50 mg Oral TID PRN [name], [name]       Or   diphenhydrAMINE (BENADRYL) injection 50 mg  50 mg Intramuscular TID PRN [name], [name]       divalproex (DEPAKOTE ER) 24 hr tablet 1,000 mg  1,000 mg Oral Daily [name], [name]   1,000 mg at 12/31/22 1227   fluticasone (FLONASE) 50 MCG/ACT nasal spray 1 spray  1 spray Each Nare Daily [name], [name]   1 spray at [id number]   folic acid (FOLVITE) tablet 1 mg  1 mg Oral Daily [name], [name]   1 mg at 12/31/22 1227   haloperidol (HALDOL) tablet 5 mg  5 mg Oral TID PRN [name], [name]       Or   haloperidol lactate (HALDOL) injection 5 mg  5 mg Intramuscular TID PRN [name], [name]       ibuprofen (ADVIL) tablet 600 mg  600 mg Oral Q6H PRN Izzie Geers T, MD       ipratropium (ATROVENT) nebulizer solution 0.5 mg  0.5 mg Nebulization BID Lilianna Case, [name], MD       LORazepam (ATIVAN) tablet 2 mg  2 mg Oral TID PRN [name], [name]   2 mg at 12/31/22 2121   Or   LORazepam (ATIVAN) injection 2 mg  2 mg Intramuscular TID PRN [name], [name]       losartan (COZAAR) tablet 50 mg  50 mg Oral Daily [name], DO   50 mg at 12/31/22 1228   magnesium hydroxide (MILK OF MAGNESIA) suspension 30 mL  30 mL Oral Daily PRN [name], [name]       metoprolol succinate (TOPROL-XL) 24 hr  tablet 50 mg  50 mg Oral Daily [name], DO   50 mg at 12/31/22 1227   mirtazapine (REMERON) tablet 15 mg  15 mg Oral QHS [name], [name]   15 mg at 12/31/22 2121   multivitamin with minerals tablet  1 tablet  1 tablet Oral Daily [name], [name]   1 tablet at 12/31/22 1228   thiamine (VITAMIN B1) tablet 100 mg  100 mg Oral Daily [name], [name]   100 mg at 12/31/22 1229   traZODone (DESYREL) tablet 150 mg  150 mg Oral QHS PRN [name], DO        Lab Results:  Results for orders placed or performed during the hospital encounter of 12/30/22 (from the past 48 hour(s))  Lipid panel     Status: Abnormal   Collection Time: 12/31/22  2:10 PM  Result Value Ref Range   Cholesterol 149 0 - 200 mg/dL   Triglycerides 160 (H) <150 mg/dL   HDL 26 (L) >40 mg/dL   Total CHOL/HDL Ratio 5.7 RATIO   VLDL 32 0 - 40 mg/dL   LDL Cholesterol 91 0 - 99 mg/dL    Comment:        Total Cholesterol/HDL:CHD Risk Coronary Heart Disease Risk Table                     Men   Women  1/2 Average Risk   3.4   3.3  Average Risk       5.0   4.4  2 X Average Risk   9.6   7.1  3 X Average Risk  23.4   11.0        Use the calculated Patient Ratio above and the CHD Risk Table to determine the patient's CHD Risk.        ATP III CLASSIFICATION (LDL):  <100     mg/dL   Optimal  100-129  mg/dL   Near or Above                    Optimal  130-159  mg/dL   Borderline  160-189  mg/dL   High  >190     mg/dL   Very High Performed at [facility], [place], [place] [address]   Valproic acid level     Status: Abnormal   Collection Time: 12/31/22  2:10 PM  Result Value Ref Range   Valproic Acid Lvl 36 (L) 50.0 - 100.0 ug/mL    Comment: Performed at [facility], [address] [place], [place] [address]    Blood Alcohol level:  Lab Results  Component Value Date   [facility] <10 12/19/2022   ETH <10 [id number]    Metabolic Disorder  Labs: Lab Results  Component Value Date   HGBA1C 5.5 04/02/2021   MPG 111 04/02/2021   MPG 119.76 01/23/2021   No results found for: "PROLACTIN" Lab Results  Component Value Date   CHOL 149 12/31/2022   TRIG 160 (H) 12/31/2022   HDL 26 (L) 12/31/2022   CHOLHDL 5.7 12/31/2022   VLDL 32 12/31/2022   LDLCALC 91 12/31/2022   LDLCALC 122 (H) 05/04/2021    Physical Findings: AIMS:  , ,  ,  ,    CIWA:    COWS:     Musculoskeletal: Strength & Muscle Tone: within normal limits Gait & Station: normal Patient leans: N/A  Psychiatric Specialty Exam:  Presentation  General Appearance:  Appropriate for Environment  Eye Contact: Good  Speech: Clear and Coherent  Speech Volume: Normal  Handedness: -- (unknown)   Mood and Affect  Mood: Depressed  Affect: Congruent   Thought Process  Thought Processes: Coherent; Goal Directed; Linear  Descriptions of Associations:Intact  Orientation:Full (Time, Place and Person)  Thought Content:Logical  History of Schizophrenia/Schizoaffective disorder:No data recorded Duration of Psychotic Symptoms:No data recorded Hallucinations:No data recorded Ideas of Reference:None  Suicidal Thoughts:No data recorded Homicidal Thoughts:No data recorded  Sensorium  Memory: Immediate Fair; Recent Fair  Judgment: Fair  Insight: Good   Executive Functions  Concentration: Good  Attention Span: Good  Recall: Good  Fund of Knowledge: Good  Language: Good   Psychomotor Activity  Psychomotor Activity:No data recorded  Assets  Assets: Communication Skills; Leisure Time; Resilience   Sleep  Sleep:No data recorded   Physical Exam: Physical Exam Vitals and nursing note reviewed.  Constitutional:      Appearance: Normal appearance.  HENT:     Head: Normocephalic and atraumatic.     Mouth/Throat:     Pharynx: Oropharynx is clear.  Eyes:     Pupils: Pupils are equal, round, and reactive to light.   Cardiovascular:     Rate and Rhythm: Normal rate and regular rhythm.  Pulmonary:     Effort: Pulmonary effort is normal.     Breath sounds: Normal breath sounds.  Abdominal:     General: Abdomen is flat.     Palpations: Abdomen is soft.  Musculoskeletal:        General: Normal range of motion.     Comments: Drain the abscess is still somewhat inflamed not obviously infected both forearms.  Skin:    General: Skin is warm and dry.  Neurological:     General: No focal deficit present.     Mental Status: He is alert. Mental status is at baseline.  Psychiatric:        Attention and Perception: He is inattentive.        Mood and Affect: Mood normal. Affect is blunt.        Speech: Speech is delayed.        Behavior: Behavior is slowed.        Thought Content: Thought content normal.        Cognition and Memory: Cognition normal.        Judgment: Judgment is impulsive.    Review of Systems  Constitutional: Negative.   HENT: Negative.    Eyes: Negative.   Respiratory: Negative.    Cardiovascular: Negative.   Gastrointestinal: Negative.   Musculoskeletal: Negative.   Skin: Negative.   Neurological: Negative.   Psychiatric/Behavioral:  Positive for substance abuse. The patient is nervous/anxious.    Blood pressure 118/89, pulse 86, temperature 98.7 F (37.1 C), temperature source Oral, resp. rate 18, height '5\' 10"'$  (1.778 m), weight 97.5 kg, SpO2 96 %. Body mass index is 30.85 kg/m.   Treatment Plan Summary: Medication management and Plan reviewed medicine.  Depakote has been helpful in the past and is continued.  Abilify being continued for occasional psychotic symptoms along with mood symptoms.  Encourage patient to be up out of bed let us know what he needs.  He will probably ultimately be very difficult as far as placement.  We will look into whether any substance abuse facilities would be willing to take him but the chances are not very good.  Alethia Berthold, MD 01/01/2023,  2:19 PM

## 2023-01-01 NOTE — Progress Notes (Signed)
This writer went to check with patient, while eating lunch to see if he would come up and take his scheduled medication. Patient stated that he would after he finishes eating.

## 2023-01-01 NOTE — BHH Counselor (Signed)
CSW has provided patient with boarding house list.  Mr.

## 2023-01-01 NOTE — BHH Group Notes (Signed)
[place] Group Notes:  (Nursing/MHT/Case Management/Adjunct)  Date:  01/01/2023  Time:  3:19 PM  Type of Therapy:  Psychoeducational Skills  Participation Level:  Did Not Attend   Adela Lank [facility] 01/01/2023, 3:19 PM

## 2023-01-01 NOTE — Plan of Care (Signed)
D- Patient alert and oriented. Patient presented in an anxious mood on assessment, found lying in bed and not making much eye contact with this Clinical research associate. Per his patient reported that he slept "poor" last night and had complaints of pain, but he did not endorse any of this when this writer spoke to him. Patient also endorsed hopelessness, depression, and anxiety, on his self-inventory, but none of this was elaborated on. Patient denied SI, HI, AVH at this time. Patient had no stated goals for today.  A- Patient refused to get up and take scheduled medications administered to patient, per MD orders. Support and encouragement provided.  Routine safety checks conducted every 15 minutes. Patient informed to notify staff with problems or concerns.  R- Patient contracts for safety at this time. Patient isolates to room, sleeping most of the day, except for meals. Patient remains safe at this time.  Problem: Education: Goal: Knowledge of General Education information will improve Description: Including pain rating scale, medication(s)/side effects and non-pharmacologic comfort measures Outcome: Not Progressing   Problem: Health Behavior/Discharge Planning: Goal: Ability to manage health-related needs will improve Outcome: Not Progressing   Problem: Clinical Measurements: Goal: Ability to maintain clinical measurements within normal limits will improve Outcome: Not Progressing Goal: Will remain free from infection Outcome: Not Progressing Goal: Diagnostic test results will improve Outcome: Not Progressing Goal: Respiratory complications will improve Outcome: Not Progressing Goal: Cardiovascular complication will be avoided Outcome: Not Progressing   Problem: Activity: Goal: Risk for activity intolerance will decrease Outcome: Not Progressing   Problem: Nutrition: Goal: Adequate nutrition will be maintained Outcome: Not Progressing   Problem: Coping: Goal: Level of anxiety will  decrease Outcome: Not Progressing   Problem: Elimination: Goal: Will not experience complications related to bowel motility Outcome: Not Progressing Goal: Will not experience complications related to urinary retention Outcome: Not Progressing   Problem: Pain Managment: Goal: General experience of comfort will improve Outcome: Not Progressing   Problem: Safety: Goal: Ability to remain free from injury will improve Outcome: Not Progressing   Problem: Skin Integrity: Goal: Risk for impaired skin integrity will decrease Outcome: Not Progressing

## 2023-01-01 NOTE — Progress Notes (Signed)
This writer went to get patient, once again, for scheduled morning medication. Patient is found lying in bed, and when asked if he was going to come up, he said "where's it at", and this Probation officer stated he needs to come to the medication room. Patient turned over and said "ok, I'll come up in a minute". This has been ongoing since this morning. This Probation officer will attempt to administer medications at a later time.

## 2023-01-01 NOTE — Group Note (Signed)
Recreation Therapy Group Note   Group Topic:Goal Setting  Group Date: 01/01/2023 Start Time: 1000 End Time: 1055 Facilitators: Vilma Prader, LRT, CTRS Location:  Craft Room  Group Description: Scientist, physiological. Patients were given many different magazines, a glue stick, markers, and a piece of cardstock paper. LRT and pts discussed the importance of having goals in life. LRT and pts discussed the difference between short-term and long-term goals. LRT encouraged pts to create a vision board, with images they picked and then cut out by LRT from the magazine, for themselves that capture their short and long-term goals they have for themselves. On the back of the paper, pt encouraged to write 3 different coping skills that can help them reach those goals. LRT encouraged pts to show and explain their vision board to the group once complete. LRT offered to laminate vision board once dry and complete.   Affect/Mood: N/A   Participation Level: Did not attend    Clinical Observations/Individualized Feedback: Cory Floyd did not attend group due to resting in his room.   Plan: Continue to engage patient in RT group sessions 2-3x/week.   Vilma Prader, LRT, CTRS 01/01/2023 11:27 AM

## 2023-01-01 NOTE — Progress Notes (Signed)
Patient was coming up the hallway, leaving out of treatment team. This writer attempted to stop patient to see if he wanted to take his scheduled morning medication. Patient quickly said "no", and kept walking. MD will be made aware.

## 2023-01-01 NOTE — BH IP Treatment Plan (Signed)
Interdisciplinary Treatment and Diagnostic Plan Update  01/01/2023 Time of Session: 08:56 Cory Floyd MRN: [id number]  Principal Diagnosis: Bipolar 1 disorder [name])  Secondary Diagnoses: Principal Problem:   Bipolar 1 disorder ([place])   Current Medications:  Current Facility-Administered Medications  Medication Dose Route Frequency Provider Last Rate Last Admin   acetaminophen (TYLENOL) tablet 650 mg  650 mg Oral Q6H PRN [name], [name]       alum & mag hydroxide-simeth (MAALOX/MYLANTA) 200-200-20 MG/5ML suspension 30 mL  30 mL Oral Q4H PRN [name], [name]       ARIPiprazole (ABILIFY) tablet 10 mg  10 mg Oral Daily [name], [name]   10 mg at 12/31/22 1227   cloNIDine (CATAPRES) tablet 0.2 mg  0.2 mg Oral Q6H PRN [name], DO       diphenhydrAMINE (BENADRYL) capsule 50 mg  50 mg Oral TID PRN [name], [name]       Or   diphenhydrAMINE (BENADRYL) injection 50 mg  50 mg Intramuscular TID PRN [name], [name]       divalproex (DEPAKOTE ER) 24 hr tablet 1,000 mg  1,000 mg Oral Daily [name], [name]   1,000 mg at 12/31/22 1227   fluticasone (FLONASE) 50 MCG/ACT nasal spray 1 spray  1 spray Each Nare Daily [name], [name]   1 spray at [id number]   folic acid (FOLVITE) tablet 1 mg  1 mg Oral Daily [name], [name]   1 mg at 12/31/22 1227   haloperidol (HALDOL) tablet 5 mg  5 mg Oral TID PRN [name], [name]       Or   haloperidol lactate (HALDOL) injection 5 mg  5 mg Intramuscular TID PRN [name], [name]       ipratropium (ATROVENT) nebulizer solution 0.5 mg  0.5 mg Nebulization Q6H Tollie Eth F, RPH   0.5 mg at 01/01/23 0757   LORazepam (ATIVAN) tablet 2 mg  2 mg Oral TID PRN [name], [name]   2 mg at 12/31/22 2121   Or   LORazepam (ATIVAN) injection 2 mg  2 mg Intramuscular TID PRN [name], [name]       losartan (COZAAR) tablet 50 mg  50 mg Oral Daily [name], DO   50 mg  at 12/31/22 1228   magnesium hydroxide (MILK OF MAGNESIA) suspension 30 mL  30 mL Oral Daily PRN [name], [name]       metoprolol succinate (TOPROL-XL) 24 hr tablet 50 mg  50 mg Oral Daily [name], DO   50 mg at 12/31/22 1227   mirtazapine (REMERON) tablet 15 mg  15 mg Oral QHS [name], [name]   15 mg at 12/31/22 2121   multivitamin with minerals tablet 1 tablet  1 tablet Oral Daily [name], [name]   1 tablet at 12/31/22 1228   thiamine (VITAMIN B1) tablet 100 mg  100 mg Oral Daily [name], [name]   100 mg at 12/31/22 1229   traZODone (DESYREL) tablet 150 mg  150 mg Oral QHS PRN [name], DO       PTA Medications: Medications Prior to Admission  Medication Sig Dispense Refill Last Dose   ARIPiprazole (ABILIFY) 10 MG tablet Take 1 tablet (10 mg total) by mouth daily. (Patient not taking: Reported on 12/27/2022) 30 tablet 0 Not Taking   benzonatate (TESSALON) 100 MG capsule Take 1 capsule (100 mg total) by mouth 3 (three) times daily as needed for cough. (Patient  not taking: Reported on 12/31/2022) 20 capsule 0 Not Taking   divalproex (DEPAKOTE ER) 500 MG 24 hr tablet Take 2 tablets (1,000 mg total) by mouth at bedtime. (Patient not taking: Reported on 12/27/2022) 60 tablet 0 Not Taking   ipratropium (ATROVENT HFA) 17 MCG/ACT inhaler Inhale 2 puffs into the lungs every 6 (six) hours as needed for wheezing. (Patient not taking: Reported on 12/31/2022) 1 each 12 Not Taking   mirtazapine (REMERON) 15 MG tablet Take 1 tablet (15 mg total) by mouth at bedtime. For depression/sleep (Patient not taking: Reported on 12/27/2022) 30 tablet 0 Not Taking    Patient Stressors: Financial difficulties   Health problems   Legal issue   Medication change or noncompliance   Substance abuse    Patient Strengths: Ability for insight  Average or above average intelligence  Communication skills   Treatment Modalities: Medication Management, Group therapy, Case  management,  1 to 1 session with clinician, Psychoeducation, Recreational therapy.   Physician Treatment Plan for Primary Diagnosis: Bipolar 1 disorder ([place]) Long Term Goal(s): Improvement in symptoms so as ready for discharge   Short Term Goals: Ability to identify changes in lifestyle to reduce recurrence of condition will improve Ability to verbalize feelings will improve Ability to disclose and discuss suicidal ideas Ability to demonstrate self-control will improve Ability to identify and develop effective coping behaviors will improve Ability to maintain clinical measurements within normal limits will improve Compliance with prescribed medications will improve Ability to identify triggers associated with substance abuse/mental health issues will improve  Medication Management: Evaluate patient's response, side effects, and tolerance of medication regimen.  Therapeutic Interventions: 1 to 1 sessions, Unit Group sessions and Medication administration.  Evaluation of Outcomes: Not Met  Physician Treatment Plan for Secondary Diagnosis: Principal Problem:   Bipolar 1 disorder ([place])  Long Term Goal(s): Improvement in symptoms so as ready for discharge   Short Term Goals: Ability to identify changes in lifestyle to reduce recurrence of condition will improve Ability to verbalize feelings will improve Ability to disclose and discuss suicidal ideas Ability to demonstrate self-control will improve Ability to identify and develop effective coping behaviors will improve Ability to maintain clinical measurements within normal limits will improve Compliance with prescribed medications will improve Ability to identify triggers associated with substance abuse/mental health issues will improve     Medication Management: Evaluate patient's response, side effects, and tolerance of medication regimen.  Therapeutic Interventions: 1 to 1 sessions, Unit Group sessions and Medication  administration.  Evaluation of Outcomes: Not Met   RN Treatment Plan for Primary Diagnosis: Bipolar 1 disorder ([place]) Long Term Goal(s): Knowledge of disease and therapeutic regimen to maintain health will improve  Short Term Goals: Ability to remain free from injury will improve, Ability to verbalize frustration and anger appropriately will improve, Ability to demonstrate self-control, Ability to participate in decision making will improve, Ability to verbalize feelings will improve, Ability to disclose and discuss suicidal ideas, Ability to identify and develop effective coping behaviors will improve, and Compliance with prescribed medications will improve  Medication Management: RN will administer medications as ordered by provider, will assess and evaluate patient's response and provide education to patient for prescribed medication. RN will report any adverse and/or side effects to prescribing provider.  Therapeutic Interventions: 1 on 1 counseling sessions, Psychoeducation, Medication administration, Evaluate responses to treatment, Monitor vital signs and CBGs as ordered, Perform/monitor CIWA, COWS, AIMS and Fall Risk screenings as ordered, Perform wound care treatments as ordered.  Evaluation  of Outcomes: Not Met   LCSW Treatment Plan for Primary Diagnosis: Bipolar 1 disorder ([place]) Long Term Goal(s): Safe transition to appropriate next level of care at discharge, Engage patient in therapeutic group addressing interpersonal concerns.  Short Term Goals: Engage patient in aftercare planning with referrals and resources, Increase social support, Increase ability to appropriately verbalize feelings, Increase emotional regulation, Facilitate acceptance of mental health diagnosis and concerns, Facilitate patient progression through stages of change regarding substance use diagnoses and concerns, Identify triggers associated with mental health/substance abuse issues, and Increase skills for  wellness and recovery  Therapeutic Interventions: Assess for all discharge needs, 1 to 1 time with Social worker, Explore available resources and support systems, Assess for adequacy in community support network, Educate family and significant other(s) on suicide prevention, Complete Psychosocial Assessment, Interpersonal group therapy.  Evaluation of Outcomes: Not Met   Progress in Treatment: Attending groups: No. Participating in groups: No. Taking medication as prescribed: No. Toleration medication: Yes. Family/Significant other contact made: No, will contact:  if given permission. Patient understands diagnosis: Yes. Discussing patient identified problems/goals with staff: Yes. Medical problems stabilized or resolved: Yes. Denies suicidal/homicidal ideation: Yes. Issues/concerns per patient self-inventory: No. Other: none.  New problem(s) identified: No, Describe:  none identified.  New Short Term/Long Term Goal(s): detox, elimination of symptoms of psychosis, medication management for mood stabilization; elimination of SI thoughts; development of comprehensive mental wellness/sobriety plan.  Patient Goals:  "Housing, housing, and more housing."  Discharge Plan or Barriers: CSW will assist pt with development of an appropriate aftercare/discharge plan.   Reason for Continuation of Hospitalization: Anxiety Medical Issues Medication stabilization Suicidal ideation Withdrawal symptoms  Estimated Length of Stay: 1-7 days  Last 3 [place] Suicide Severity Risk Score: [place] Admission (Current) from 12/30/2022 in [place] ED to Hosp-Admission (Discharged) from 12/26/2022 in [place] [facility] ED from 12/19/2022 in [facility] Emergency Department at [place] No Risk No Risk No Risk       Last PHQ 2/9 Scores:     No data to display          Scribe for Treatment Team: Shirl Harris,  LCSW 01/01/2023 9:12 AM

## 2023-01-01 NOTE — Progress Notes (Signed)
Patient calm and cooperative during assessment denying SI/HI/AVH. Pt observed interacting appropriately with staff and peers on the unit. Pt compliant with medication administration per MD orders. Pt given education, support, and encouragement to be active in his treatment plan. Pt being monitored Q 15 minutes for safety per unit protocol, remains safe on the unit

## 2023-01-01 NOTE — BHH Counselor (Signed)
CSW attempted to complete patient's PSA.  Patient acknowledged CSW when this writer entered room, however, patient either fell or pretended to be asleep when CSW stated that PSA was needed.  CSW observed rise and fall of patient's chest. No distress noted.  Assunta Curtis, MSW, LCSW 01/01/2023 11:20 AM

## 2023-01-01 NOTE — Group Note (Signed)
[facility] LCSW Group Therapy Note    Group Date: 01/01/2023 Start Time: 1300 End Time: 1400  Type of Therapy and Topic:  Group Therapy:  Overcoming Obstacles  Participation Level:  BHH PARTICIPATION LEVEL: Did Not Attend   Description of Group:   In this group patients will be encouraged to explore what they see as obstacles to their own wellness and recovery. They will be guided to discuss their thoughts, feelings, and behaviors related to these obstacles. The group will process together ways to cope with barriers, with attention given to specific choices patients can make. Each patient will be challenged to identify changes they are motivated to make in order to overcome their obstacles. This group will be process-oriented, with patients participating in exploration of their own experiences as well as giving and receiving support and challenge from other group members.  Therapeutic Goals: 1. Patient will identify personal and current obstacles as they relate to admission. 2. Patient will identify barriers that currently interfere with their wellness or overcoming obstacles.  3. Patient will identify feelings, thought process and behaviors related to these barriers. 4. Patient will identify two changes they are willing to make to overcome these obstacles:    Summary of Patient Progress X   Therapeutic Modalities:   Cognitive Behavioral Therapy Solution Focused Therapy Motivational Interviewing Relapse Prevention Therapy   Shirl Harris, LCSW

## 2023-01-01 NOTE — Progress Notes (Signed)
This Probation officer was informed by the other staff nurse, Estill Bamberg that patient came up to finally take his scheduled medication. It was told to this writer that after patient used his Flonase, he went back to his room and did not take his medicine.

## 2023-01-01 NOTE — BHH Group Notes (Signed)
[place] Group Notes:  (Nursing/MHT/Case Management/Adjunct)  Date:  01/01/2023  Time:  9:15 PM  Type of Therapy:   Wrap up  Participation Level:  Minimal  Participation Quality:  Appropriate  Affect:  Flat  Cognitive:  Alert  Insight:  Good  Engagement in Group:  Engaged and no goal he said he had a confusing day.  Modes of Intervention:  Support  Summary of Progress/Problems:  Nehemiah Settle 01/01/2023, 9:15 PM

## 2023-01-01 NOTE — BHH Suicide Risk Assessment (Signed)
[place] INPATIENT:  Family/Significant Other Suicide Prevention Education  Suicide Prevention Education:  Patient Refusal for Family/Significant Other Suicide Prevention Education: The patient Cory Floyd has refused to provide written consent for family/significant other to be provided Family/Significant Other Suicide Prevention Education during admission and/or prior to discharge.  Physician notified.   SPE completed with pt, as pt refused to consent to family contact. SPI pamphlet provided to pt and pt was encouraged to share information with support network, ask questions, and talk about any concerns relating to SPE. Pt denies access to guns/firearms and verbalized understanding of information provided. Mobile Crisis information also provided to pt.    Rozann Lesches 01/01/2023, 3:03 PM

## 2023-01-01 NOTE — BHH Counselor (Signed)
Adult Comprehensive Assessment  Patient ID: Cory Floyd, male   DOB: 04/15/1976, 47 y.o.   MRN: [id number]  Information Source: Information source: Patient  Current Stressors:  Patient states their primary concerns and needs for treatment are:: "Zacarias Pontes brought me here" Patient states their goals for this hospitilization and ongoing recovery are:: "Housing" Educational / Learning stressors: Pt denies. Employment / Job issues: Pt denies. Family Relationships: "I don't speak to them" Financial / Lack of resources (include bankruptcy): Pt denies. Housing / Lack of housing: Pt denies. Physical health (include injuries & life threatening diseases): "I don't know yet" Social relationships: Pt denies. Substance abuse: "meth" Bereavement / Loss: Pt denies.  Living/Environment/Situation:  Living Arrangements: Other (Comment) Living conditions (as described by patient or guardian): Pt reports that she is homeless. How long has patient lived in current situation?: "since my mom died and they stole the house" What is atmosphere in current home: Temporary, Chaotic  Family History:  Marital status: Divorced Divorced, when?: "long time ago" What types of issues is patient dealing with in the relationship?: no current relationship Are you sexually active?: No What is your sexual orientation?: straight Has your sexual activity been affected by drugs, alcohol, medication, or emotional stress?: na Does patient have children?: Yes How many children?: 2 How is patient's relationship with their children?: "I don't talk to my kids"  Childhood History:  By whom was/is the patient raised?: Grandparents Additional childhood history information: pt was in foster care during younger years, stayed with his grandmother during middle and high school Description of patient's relationship with caregiver when they were a child: never knew father, removed from mother's care at young age Patient's  description of current relationship with people who raised him/her: Patient declined to answer. How were you disciplined when you got in trouble as a child/adolescent?: Patient declined to answer. Does patient have siblings?: Yes Number of Siblings: 1 Description of patient's current relationship with siblings: "nonexistent" Did patient suffer any verbal/emotional/physical/sexual abuse as a child?:  (Patient declined to answer.) Did patient suffer from severe childhood neglect?:  (Patient declined to answer.) Has patient ever been sexually abused/assaulted/raped as an adolescent or adult?:  (Patient declined to answer.) Was the patient ever a victim of a crime or a disaster?:  (Patient declined to answer.) Witnessed domestic violence?:  (Patient declined to answer.) Has patient been affected by domestic violence as an adult?:  (Patient declined to answer.)  Education:  Highest grade of school patient has completed: "high school" Currently a student?: No Learning disability?: No  Employment/Work Situation:   Employment Situation: On disability Why is Patient on Disability: "bipolar" How Long has Patient Been on Disability: Patient declined to answer. Patient's Job has Been Impacted by Current Illness: No What is the Longest Time Patient has Held a Job?: Patient declined to answer. Where was the Patient Employed at that Time?: Patient declined to answer. Has Patient ever Been in the [employer]?: No  Financial Resources:   Financial resources: Teacher, early years/pre, Medicare Does patient have a Programmer, applications or guardian?: No  Alcohol/Substance Abuse:   What has been your use of drugs/alcohol within the last 12 months?: Meth: "I used once in 18 months" If attempted suicide, did drugs/alcohol play a role in this?: No Alcohol/Substance Abuse Treatment Hx: Denies past history Has alcohol/substance abuse ever caused legal problems?: No  Social Support System:   Patient's Community Support  System: None Describe Community Support System: Patient declined to answer. Type of faith/religion: Patient declined  to answer. How does patient's faith help to cope with current illness?: Patient declined to answer.  Leisure/Recreation:   Do You Have Hobbies?: No  Strengths/Needs:   What is the patient's perception of their strengths?: "I don't know" Patient states they can use these personal strengths during their treatment to contribute to their recovery: Patient declined to answer. Patient states these barriers may affect/interfere with their treatment: Patient declined to answer.  Discharge Plan:   Currently receiving community mental health services: No Patient states concerns and preferences for aftercare planning are: Patient reports that he is unsure if he would like to begin treatment services. Patient states they will know when they are safe and ready for discharge when: "I don't" Does patient have access to transportation?: No Does patient have financial barriers related to discharge medications?: No Plan for living situation after discharge: Patient reports that he would like information on boarding houses. Will patient be returning to same living situation after discharge?: No  Summary/Recommendations:   Summary and Recommendations (to be completed by the evaluator): Patient is a 47 year old male from [place], [place] ([place]  Patient presents to the hospital for concerns for housing.  He reports that he is seeing housing.  He reports recent triggers for current mental health state is his recent incarceration.  He reports that he was incarcerated for 18 months.  During that time, he reports that he was to have a caseworker to assist him prior to discharge from incarceration.  Patient reports that he never heard from his caseworker and as a result is currently dealing with homelessness.  He reports that he has limited supports and access to resources.  He reports that he  does not have a current mental health provider and is not sure that he would like one at discharge.   Recommendations include: crisis stabilization, therapeutic milieu, encourage group attendance and participation, medication management for mood stabilization and development of comprehensive mental wellness/sobriety plan.  Rozann Lesches. 01/01/2023

## 2023-01-02 NOTE — Progress Notes (Signed)
[facility] MD Progress Note  01/02/2023 3:44 PM Cory Floyd  MRN:  [id number] Subjective: Patient seen for follow-up.  47 year old man with a history of bipolar depression and substance abuse.  Patient has been up out of bed eating and taking his medicine.  He seemed in a slightly better mood today.  Not as grumpy when I went to speak with him.  Pulm he was still feeling very bad denied any hallucinations denied any thoughts of violence or suicide.  Said that he just felt exhausted and needed to rest longer. Principal Problem: Bipolar 1 disorder (HCC) Diagnosis: Principal Problem:   Bipolar 1 disorder (HCC) Active Problems:   Gastroesophageal reflux disease without esophagitis   Cocaine abuse with cocaine-induced mood disorder (HCC)   Amphetamine abuse (HCC)   Cellulitis  Total Time spent with patient: 30 minutes  Past Psychiatric History: Past history of substance abuse multiple relapses chronic mood symptoms all of course worsened when using.  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   Anginal pain ([place])    Arthritis    Bipolar 1 disorder ([place])    COVID-19    Depressed    Diabetes mellitus without complication ([place])    Dyspnea    Fatty liver    Hypertension    Schizophrenia ([place])     Past Surgical History:  Procedure Laterality Date   COLONOSCOPY  02/05/2014   diverticulosis, hyperplastic polyp x 2   fracture arm Right    INCISION AND DRAINAGE ABSCESS Bilateral 12/29/2022   Procedure: INCISION AND DRAINAGE ABSCESS BILATERAL ARMS;  Surgeon: Shona Needles, MD;  Location: [place];  Service: Orthopedics;  Laterality: Bilateral;   KNEE SURGERY Right 2013   NASAL SEPTUM SURGERY     NASAL TURBINATE REDUCTION Bilateral 12/12/2016   Procedure: TURBINATE REDUCTION/SUBMUCOSAL RESECTION;  Surgeon: Beverly Gust, MD;  Location: [facility];  Service: ENT;  Laterality: Bilateral;   Family History:  Family History  Problem Relation  Age of Onset   Hepatitis C Mother    Cancer Mother    Cancer Maternal Aunt    Hypertension Maternal Grandmother    Family Psychiatric  History: See previous Social History:  Social History   Substance and Sexual Activity  Alcohol Use Yes   Comment: 1/2-5th pint of liquor     Social History   Substance and Sexual Activity  Drug Use Yes   Types: Cocaine, Marijuana   Comment: heroin    Social History   Socioeconomic History   Marital status: Divorced    Spouse name: Not on file   Number of children: Not on file   Years of education: Not on file   Highest education level: Not on file  Occupational History   Not on file  Tobacco Use   Smoking status: Every Day    Packs/day: 1.00    Years: 20.00    Total pack years: 20.00    Types: Cigarettes   Smokeless tobacco: Never  Vaping Use   Vaping Use: Never used  Substance and Sexual Activity   Alcohol use: Yes    Comment: 1/2-5th pint of liquor   Drug use: Yes    Types: Cocaine, Marijuana    Comment: heroin   Sexual activity: Yes    Birth control/protection: None  Other Topics Concern   Not on file  Social History Narrative   Not on file   Social Determinants of Health  Financial Resource Strain: Not on file  Food Insecurity: Food Insecurity Present (12/30/2022)   Hunger Vital Sign    Worried About Running Out of Food in the Last Year: Often true    Ran Out of Food in the Last Year: Often true  Transportation Needs: Unmet Transportation Needs (12/30/2022)   PRAPARE - Hydrologist (Medical): Yes    Lack of Transportation (Non-Medical): Yes  Physical Activity: Not on file  Stress: Not on file  Social Connections: Not on file   Additional Social History:                         Sleep: Fair  Appetite:  Fair  Current Medications: Current Facility-Administered Medications  Medication Dose Route Frequency Provider Last Rate Last Admin   acetaminophen (TYLENOL) tablet 650 mg   650 mg Oral Q6H PRN [name], [name]       alum & mag hydroxide-simeth (MAALOX/MYLANTA) 200-200-20 MG/5ML suspension 30 mL  30 mL Oral Q4H PRN [name], [name]       ARIPiprazole (ABILIFY) tablet 10 mg  10 mg Oral Daily [name], [name]   10 mg at 01/02/23 [id number]   cloNIDine (CATAPRES) tablet 0.2 mg  0.2 mg Oral Q6H PRN [name], DO       diphenhydrAMINE (BENADRYL) capsule 50 mg  50 mg Oral TID PRN [name], [name]       Or   diphenhydrAMINE (BENADRYL) injection 50 mg  50 mg Intramuscular TID PRN [name], [name]       divalproex (DEPAKOTE ER) 24 hr tablet 1,000 mg  1,000 mg Oral Daily [name], [name]   1,000 mg at 01/02/23 0959   fluticasone (FLONASE) 50 MCG/ACT nasal spray 1 spray  1 spray Each Nare Daily [name], [name]   1 spray at [id number]   folic acid (FOLVITE) tablet 1 mg  1 mg Oral Daily [name], [name]   1 mg at 01/02/23 [id number]   haloperidol (HALDOL) tablet 5 mg  5 mg Oral TID PRN [name], [name]       Or   haloperidol lactate (HALDOL) injection 5 mg  5 mg Intramuscular TID PRN [name], [name]       ibuprofen (ADVIL) tablet 600 mg  600 mg Oral Q6H PRN Mignon Bechler T, MD       ipratropium (ATROVENT) nebulizer solution 0.5 mg  0.5 mg Nebulization BID [place] Vanaken T, MD       LORazepam (ATIVAN) tablet 2 mg  2 mg Oral TID PRN [name], [name]   2 mg at 01/01/23 2104   Or   LORazepam (ATIVAN) injection 2 mg  2 mg Intramuscular TID PRN [name], [name]       losartan (COZAAR) tablet 50 mg  50 mg Oral Daily [name], DO   50 mg at 01/02/23 0959   magnesium hydroxide (MILK OF MAGNESIA) suspension 30 mL  30 mL Oral Daily PRN [name], [name]       metoprolol succinate (TOPROL-XL) 24 hr tablet 50 mg  50 mg Oral Daily [name], DO   50 mg at 01/02/23 0959   mirtazapine (REMERON) tablet 15 mg  15 mg Oral QHS [name], [name]   15 mg at 01/01/23 2104    multivitamin with minerals tablet 1 tablet  1 tablet Oral Daily [name], [name]   1 tablet at 01/02/23 [phone]  thiamine (VITAMIN B1) tablet 100 mg  100 mg Oral Daily [name], [name]   100 mg at 01/02/23 0959   traZODone (DESYREL) tablet 150 mg  150 mg Oral QHS PRN [name], DO   150 mg at 01/01/23 2305    Lab Results: No results found for this or any previous visit (from the past 48 hour(s)).  Blood Alcohol level:  Lab Results  Component Value Date   ETH <10 12/19/2022   ETH <10 [id number]    Metabolic Disorder Labs: Lab Results  Component Value Date   HGBA1C 5.8 (H) 12/31/2022   MPG 120 12/31/2022   MPG 111 04/02/2021   No results found for: "PROLACTIN" Lab Results  Component Value Date   CHOL 149 12/31/2022   TRIG 160 (H) 12/31/2022   HDL 26 (L) 12/31/2022   CHOLHDL 5.7 12/31/2022   VLDL 32 12/31/2022   LDLCALC 91 12/31/2022   LDLCALC 122 (H) 05/04/2021    Physical Findings: AIMS:  , ,  ,  ,    CIWA:    COWS:     Musculoskeletal: Strength & Muscle Tone: within normal limits Gait & Station: normal Patient leans: N/A  Psychiatric Specialty Exam:  Presentation  General Appearance:  Appropriate for Environment  Eye Contact: Good  Speech: Clear and Coherent  Speech Volume: Normal  Handedness: -- (unknown)   Mood and Affect  Mood: Depressed  Affect: Congruent   Thought Process  Thought Processes: Coherent; Goal Directed; Linear  Descriptions of Associations:Intact  Orientation:Full (Time, Place and Person)  Thought Content:Logical  History of Schizophrenia/Schizoaffective disorder:No data recorded Duration of Psychotic Symptoms:No data recorded Hallucinations:No data recorded Ideas of Reference:None  Suicidal Thoughts:No data recorded Homicidal Thoughts:No data recorded  Sensorium  Memory: Immediate Fair; Recent Fair  Judgment: Fair  Insight: Good   Executive Functions   Concentration: Good  Attention Span: Good  Recall: Good  Fund of Knowledge: Good  Language: Good   Psychomotor Activity  Psychomotor Activity:No data recorded  Assets  Assets: Communication Skills; Leisure Time; Resilience   Sleep  Sleep:No data recorded   Physical Exam: Physical Exam Vitals and nursing note reviewed.  Constitutional:      Appearance: Normal appearance.  HENT:     Head: Normocephalic and atraumatic.     Mouth/Throat:     Pharynx: Oropharynx is clear.  Eyes:     Pupils: Pupils are equal, round, and reactive to light.  Cardiovascular:     Rate and Rhythm: Normal rate and regular rhythm.  Pulmonary:     Effort: Pulmonary effort is normal.     Breath sounds: Normal breath sounds.  Abdominal:     General: Abdomen is flat.     Palpations: Abdomen is soft.  Musculoskeletal:        General: Normal range of motion.  Skin:    General: Skin is warm and dry.  Neurological:     General: No focal deficit present.     Mental Status: He is alert. Mental status is at baseline.  Psychiatric:        Attention and Perception: Attention normal.        Mood and Affect: Mood normal. Affect is blunt.        Speech: Speech is delayed.        Behavior: Behavior is withdrawn.        Thought Content: Thought content normal.    Review of Systems  Constitutional:  Positive for malaise/fatigue.  HENT: Negative.  Eyes: Negative.   Respiratory: Negative.    Cardiovascular: Negative.   Gastrointestinal: Negative.   Musculoskeletal: Negative.   Skin: Negative.   Neurological: Negative.   Psychiatric/Behavioral:  Positive for depression. Negative for hallucinations and suicidal ideas.    Blood pressure 111/82, pulse (!) 103, temperature 97.8 F (36.6 C), temperature source Oral, resp. rate 20, height '5\' 10"'$  (1.778 m), weight 97.5 kg, SpO2 100 %. Body mass index is 30.85 kg/m.   Treatment Plan Summary: Plan continue current medication including  antipsychotics and mood stabilizers.  Encourage patient to keep eating and drinking to be out of bed to interact with others to let us know how he is feeling.  I am hoping that he gets his energy back within a day or so so we can start to work on some real plans about follow-up.  Alethia Berthold, MD 01/02/2023, 3:44 PM

## 2023-01-02 NOTE — Group Note (Signed)
[place] [facility] LCSW Group Therapy Note   Group Date: 01/02/2023 Start Time: 1300 End Time: 1400   Type of Therapy/Topic:  Group Therapy:  Emotion Regulation  Participation Level:  Did Not Attend   Mood:  Description of Group:    The purpose of this group is to assist patients in learning to regulate negative emotions and experience positive emotions. Patients will be guided to discuss ways in which they have been vulnerable to their negative emotions. These vulnerabilities will be juxtaposed with experiences of positive emotions or situations, and patients challenged to use positive emotions to combat negative ones. Special emphasis will be placed on coping with negative emotions in conflict situations, and patients will process healthy conflict resolution skills.  Therapeutic Goals: Patient will identify two positive emotions or experiences to reflect on in order to balance out negative emotions:  Patient will label two or more emotions that they find the most difficult to experience:  Patient will be able to demonstrate positive conflict resolution skills through discussion or role plays:   Summary of Patient Progress: Patient did not attend group despite encouraged participation.     Therapeutic Modalities:   Cognitive Behavioral Therapy Feelings Identification Dialectical Behavioral Therapy   [name], [place]

## 2023-01-02 NOTE — Group Note (Signed)
Recreation Therapy Group Note   Group Topic:Healthy Support Systems  Group Date: 01/02/2023 Start Time: 1000 End Time: 1055 Facilitators: Vilma Prader, LRT, CTRS Location:  Craft Room  Group Description: Straw Bridge. Patients were given 10 plastic drinking straws and an equal length of masking tape. Using the materials provided, patients were instructed to build a free-standing bridge-like structure to suspend an everyday item (ex: deck of cards) off the floor or table surface. All materials were required to be used in Conservation officer, nature. LRT facilitated post-activity discussion reviewing the importance of having strong and healthy support systems in our lives. LRT discussed how the people in our lives serve as the tape and the book we placed on top of our straw structure are the stressors we face in daily life. LRT and pts discussed what happens in our life when things get too heavy for [place], and we don't have strong supports outside of the hospital. Pt shared 2 of their healthy supports aloud in the group.   Affect/Mood: N/A   Participation Level: Did not attend    Clinical Observations/Individualized Feedback: Cory Floyd did not attend group due to resting in his room.  Plan: Continue to engage patient in RT group sessions 2-3x/week.   Vilma Prader, LRT, CTRS 01/02/2023 11:19 AM

## 2023-01-02 NOTE — Plan of Care (Signed)
D- Patient alert and oriented. Patient presented in an anxious and slightly irritable mood on assessment stating that he slept "on and off" last night stating that he needs sleep medications. However, patient stays in bed all day, which may be contributing to his sleep issues. Patient denied depression, but endorsed "a little bit" of anxiety, but stated he does not know why he's feeling this way. Patient also denied SI, HI, AVH, and pain at this time, stating that he is just trying to rest, as he turned over, facing away from this Probation officer. Patient had no stated goals for today.  A- Scheduled medications administered to patient, per MD orders. Support and encouragement provided.  Routine safety checks conducted every 15 minutes.  Patient informed to notify staff with problems or concerns.  R- No adverse drug reactions noted. Patient contracts for safety at this time. Patient compliant with medications. Patient receptive, calm, and cooperative. Patient isolates to room, except for meals, medications have to be taken to him in order for him to take them, otherwise he will not get up. Patient remains safe at this time.  Problem: Education: Goal: Knowledge of General Education information will improve Description: Including pain rating scale, medication(s)/side effects and non-pharmacologic comfort measures Outcome: Progressing   Problem: Health Behavior/Discharge Planning: Goal: Ability to manage health-related needs will improve Outcome: Progressing   Problem: Clinical Measurements: Goal: Ability to maintain clinical measurements within normal limits will improve Outcome: Progressing Goal: Will remain free from infection Outcome: Progressing Goal: Diagnostic test results will improve Outcome: Progressing Goal: Respiratory complications will improve Outcome: Progressing Goal: Cardiovascular complication will be avoided Outcome: Progressing   Problem: Activity: Goal: Risk for activity  intolerance will decrease Outcome: Progressing   Problem: Nutrition: Goal: Adequate nutrition will be maintained Outcome: Progressing   Problem: Coping: Goal: Level of anxiety will decrease Outcome: Progressing   Problem: Elimination: Goal: Will not experience complications related to bowel motility Outcome: Progressing Goal: Will not experience complications related to urinary retention Outcome: Progressing   Problem: Pain Managment: Goal: General experience of comfort will improve Outcome: Progressing   Problem: Safety: Goal: Ability to remain free from injury will improve Outcome: Progressing   Problem: Skin Integrity: Goal: Risk for impaired skin integrity will decrease Outcome: Progressing

## 2023-01-03 ENCOUNTER — Telehealth (INDEPENDENT_AMBULATORY_CARE_PROVIDER_SITE_OTHER): Payer: Self-pay

## 2023-01-03 ENCOUNTER — Inpatient Hospital Stay (INDEPENDENT_AMBULATORY_CARE_PROVIDER_SITE_OTHER): Payer: Medicare (Managed Care) | Admitting: Primary Care

## 2023-01-03 DIAGNOSIS — T8131XA Disruption of external operation (surgical) wound, not elsewhere classified, initial encounter: Secondary | ICD-10-CM

## 2023-01-03 LAB — AEROBIC/ANAEROBIC CULTURE W GRAM STAIN (SURGICAL/DEEP WOUND)
Culture: NO GROWTH
Culture: NO GROWTH
Gram Stain: NONE SEEN
Gram Stain: NONE SEEN

## 2023-01-03 MED ORDER — IPRATROPIUM BROMIDE 0.02 % IN SOLN: 0.5000 mg | Freq: Two times a day (BID) | RESPIRATORY_TRACT | Status: DC | PRN

## 2023-01-03 MED ORDER — DOXYCYCLINE HYCLATE 100 MG PO TABS
100.0000 mg | ORAL_TABLET | Freq: Two times a day (BID) | ORAL | Status: DC
  Administered 2023-01-03 – 2023-01-14 (×16): 100 mg via ORAL
  Filled 2023-01-03 (×27): qty 1

## 2023-01-03 NOTE — Progress Notes (Signed)
   01/03/23 0000  Psych Admission Type (Psych Patients Only)  Admission Status Voluntary  Psychosocial Assessment  Patient Complaints Anxiety  Eye Contact Brief  Facial Expression Flat  Affect Blunted  Speech Logical/coherent  Interaction Minimal  Motor Activity Slow  Appearance/Hygiene Unremarkable  Behavior Characteristics Appropriate to situation  Mood Anxious  Aggressive Behavior  Effect No apparent injury  Thought Process  Coherency Circumstantial  Content WDL  Delusions None reported or observed  Perception WDL  Hallucination None reported or observed  Judgment Poor  Confusion None  Danger to Self  Current suicidal ideation? Denies (Denies)  Agreement Not to Harm Self Yes  Description of Agreement verbal  Danger to Others  Danger to Others None reported or observed   Patient isolative to his room compliant with medications. Support and encouragement provided.

## 2023-01-03 NOTE — Group Note (Signed)
LCSW Group Therapy Note   Group Date: 01/03/2023 Start Time: 1300 End Time: 1400   Type of Therapy and Topic:  Group Therapy: Boundaries  Participation Level:  Did Not Attend  Description of Group: This group will address the use of boundaries in their personal lives. Patients will explore why boundaries are important, the difference between healthy and unhealthy boundaries, and negative and postive outcomes of different boundaries and will look at how boundaries can be crossed.  Patients will be encouraged to identify current boundaries in their own lives and identify what kind of boundary is being set. Facilitators will guide patients in utilizing problem-solving interventions to address and correct types boundaries being used and to address when no boundary is being used. Understanding and applying boundaries will be explored and addressed for obtaining and maintaining a balanced life. Patients will be encouraged to explore ways to assertively make their boundaries and needs known to significant others in their lives, using other group members and facilitator for role play, support, and feedback.  Therapeutic Goals:  1.  Patient will identify areas in their life where setting clear boundaries could be  used to improve their life.  2.  Patient will identify signs/triggers that a boundary is not being respected. 3.  Patient will identify two ways to set boundaries in order to achieve balance in  their lives: 4.  Patient will demonstrate ability to communicate their needs and set boundaries  through discussion and/or role plays  Summary of Patient Progress:   X   Therapeutic Modalities:   Cognitive Behavioral Therapy Solution-Focused Therapy  Rozann Lesches, [place] 01/03/2023  2:14 PM

## 2023-01-03 NOTE — Group Note (Signed)
Recreation Therapy Group Note   Group Topic:Relaxation  Group Date: 01/03/2023 Start Time: 1000 End Time: 1045 Facilitators: Vilma Prader, LRT, CTRS Location:  Craft Room  Group Description: Mindfulness Body Scan. LRT asked pt their current level of stress and anxiety. LRT educated on the benefits of mindfulness and how it can apply to everyday life post-discharge. LRT and pt's followed along to an audio script of a "mindfulness body scan" video. LRT asked pt their level of stress and anxiety once the prompt was finished.   Affect/Mood: N/A   Participation Level: Did not attend    Clinical Observations/Individualized Feedback: Cory Floyd did not attend group due to resting in his room.  Plan: Continue to engage patient in RT group sessions 2-3x/week.   Vilma Prader, LRT, CTRS 01/03/2023 11:26 AM

## 2023-01-03 NOTE — Consult Note (Signed)
ORTHOPAEDIC CONSULTATION  REQUESTING PHYSICIAN: Clapacs, [name], MD  Chief Complaint:   Right forearm abscess  History of Present Illness: Cory Floyd is a 47 y.o. 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  Patient underwent I&D of both AC fossa's on 12/29/2022 by Dr. Doreatha Martin.  He was doing well and his left arm was healing well but he had some wound dehiscence on the right side we are called for consultation.  Per patient the left arm looks improved in the right arm has been draining for the last few days.  He denies any systemic symptoms such as fevers or chills or tachycardia.  Past Medical History:  Diagnosis Date   Alcoholic ([place])    clean for 3 years   Anginal pain ([place])    Arthritis    Bipolar 1 disorder ([place])    COVID-19    Depressed    Diabetes mellitus without complication ([place])    Dyspnea    Fatty liver    Hypertension    Schizophrenia ([place])    Past Surgical History:  Procedure Laterality Date   COLONOSCOPY  02/05/2014   diverticulosis, hyperplastic polyp x 2   fracture arm Right    INCISION AND DRAINAGE ABSCESS Bilateral 12/29/2022   Procedure: INCISION AND DRAINAGE ABSCESS BILATERAL ARMS;  Surgeon: Shona Needles, MD;  Location: [place];  Service: Orthopedics;  Laterality: Bilateral;   KNEE SURGERY Right 2013   NASAL SEPTUM SURGERY     NASAL TURBINATE REDUCTION Bilateral 12/12/2016   Procedure: TURBINATE REDUCTION/SUBMUCOSAL RESECTION;  Surgeon: Beverly Gust, MD;  Location: [facility];  Service: ENT;  Laterality: Bilateral;   Social History   Socioeconomic History   Marital status: Divorced    Spouse name: Not on file   Number of children: Not on file   Years of education: Not on file   Highest education level: Not on file  Occupational History   Not on file  Tobacco Use   Smoking status: Every Day    Packs/day: 1.00    Years: 20.00     Total pack years: 20.00    Types: Cigarettes   Smokeless tobacco: Never  Vaping Use   Vaping Use: Never used  Substance and Sexual Activity   Alcohol use: Yes    Comment: 1/2-5th pint of liquor   Drug use: Yes    Types: Cocaine, Marijuana    Comment: heroin   Sexual activity: Yes    Birth control/protection: None  Other Topics Concern   Not on file  Social History Narrative   Not on file   Social Determinants of Health   Financial Resource Strain: Not on file  Food Insecurity: Food Insecurity Present (12/30/2022)   Hunger Vital Sign    Worried About Running Out of Food in the Last Year: Often true    Ran Out of Food in the Last Year: Often true  Transportation Needs: Unmet Transportation Needs (12/30/2022)   PRAPARE - Hydrologist (Medical): Yes    Lack of Transportation (Non-Medical): Yes  Physical Activity: Not on file  Stress: Not on file  Social Connections: Not on file   Family History  Problem Relation Age of Onset   Hepatitis C Mother    Cancer Mother    Cancer Maternal Aunt    Hypertension Maternal Grandmother    Allergies  Allergen Reactions   Carrot [Daucus Carota] Anaphylaxis and Rash  Penicillins Anaphylaxis and Hives    Has patient had a PCN reaction causing immediate rash, facial/tongue/throat swelling, SOB or lightheadedness with hypotension: Yes Has patient had a PCN reaction causing severe rash involving mucus membranes or skin necrosis: No Has patient had a PCN reaction that required hospitalization Yes Has patient had a PCN reaction occurring within the last 10 years: No If all of the above answers are "NO", then may proceed with Cephalosporin use.    [place] [Ampicillin] Anaphylaxis and Hives   Prior to Admission medications   Medication Sig Start Date End Date Taking? Authorizing Provider  ARIPiprazole (ABILIFY) 10 MG tablet Take 1 tablet (10 mg total) by mouth daily. Patient not taking: Reported on 12/27/2022  12/20/22   Holley Bouche, MD  benzonatate (TESSALON) 100 MG capsule Take 1 capsule (100 mg total) by mouth 3 (three) times daily as needed for cough. Patient not taking: Reported on 12/31/2022 12/30/22   Colletta Maryland, MD  divalproex (DEPAKOTE ER) 500 MG 24 hr tablet Take 2 tablets (1,000 mg total) by mouth at bedtime. Patient not taking: Reported on 12/27/2022 12/20/22   Holley Bouche, MD  ipratropium (ATROVENT HFA) 17 MCG/ACT inhaler Inhale 2 puffs into the lungs every 6 (six) hours as needed for wheezing. Patient not taking: Reported on 12/31/2022 12/30/22   Colletta Maryland, MD  mirtazapine (REMERON) 15 MG tablet Take 1 tablet (15 mg total) by mouth at bedtime. For depression/sleep Patient not taking: Reported on 12/27/2022 12/20/22   Holley Bouche, MD  fluticasone [facility]) 50 MCG/ACT nasal spray Place 1 spray into both nostrils daily. Patient not taking: Reported on 01/23/2021 08/24/20 01/23/21  Petrucelli, Samantha R, [name]   No results found.  Positive ROS: All other systems have been reviewed and were otherwise negative with the exception of those mentioned in the HPI and as above.  Physical Exam: General:  Alert, no acute distress Psychiatric:  Patient is competent for consent with normal mood and affect   Cardiovascular:  No pedal edema Respiratory:  No wheezing, non-labored breathing GI:  Abdomen is soft and non-tender Skin:  No lesions in the area of chief complaint Neurologic:  Sensation intact distally Lymphatic:  No axillary or cervical lymphadenopathy  Orthopedic Exam:  Left upper extremity There is a small area of improving erythema with interrupted nylon sutures intact over the AC fossa no obvious areas of fluctuance or wound drainage appears to be healing well. Neurovascularly intact to left upper extremity with soft compartments AIN/PIN/U/R/M nerves intact + Radial pulse  Right upper extremity There is a 2 to 3 cm area of wound dehiscence over the [facility] fossa with a mild  amount of erythema surrounding it in the setting of recent interrupted nylon sutures which are still intact distally and proximally There is no active purulent drainage noted there is some granulation tissue around the wound edges and a small amount of serous fluid is able to be expressed without any areas of fluctuance No streaking erythema or expanding beefy erythema noted Neurovascularly intact to left upper extremity with soft compartments AIN/PIN/U/R/M nerves intact + Radial pulse    Assessment: Bilateral AC fossa status post surgical I&D Right AC fossa wound dehiscence  Plan: Agree with medicine evaluation and would treat with doxycycline for the right AC fossa No active signs of contained infection or active ongoing purulence Would recommend treatment and healing by secondary intention with dry dressing changes to prevent any foreign material from getting into the wound dehiscence Distal and proximal sutures are still  holding incision closed and would leave those in place for now Agree with repeating vital signs every shift to ensure the patient is stable if patient develops any systemic symptoms would consider IV antibiotics Will avoid closing the wound as that would encapsulate bacteria and the patient would likely reform an abscess Will reassess in the next few days and remove any stitches which need to be removed.     Steffanie Rainwater MD  Beeper #:  [id number]  01/03/2023 5:06 PM

## 2023-01-03 NOTE — Progress Notes (Addendum)
Pt denies SI/HI/AVH and verbally agrees to approach staff if these become apparent or before harming themselves/others. Rates depression 7/10. Rates anxiety 8/10. Rates pain 0/10. Pt was irritable and speaking loudly stating that he does not know why he is here. Pt spoke loudly to [facility] rep. Pt wanted atrovent but then refused it because he just got comfortable in bed. Pt has been in his room the rest of the day. Pt bilateral arm dressing applied at 1830. Pt was in bed and tolerated well. Scheduled medications administered to pt, per MD orders. RN provided support and encouragement to pt. Q15 min safety checks implemented and continued. Pt is safe on the unit. Plan of care on going and no other concerns expressed at this time.  01/03/23 0803  Psych Admission Type (Psych Patients Only)  Admission Status Voluntary  Psychosocial Assessment  Patient Complaints Anxiety;Depression  Eye Contact Brief  Facial Expression Anxious;Flat  Affect Anxious;Irritable  Speech Logical/coherent;Loud  Interaction Minimal;Isolative  Motor Activity Slow  Appearance/Hygiene Unremarkable  Behavior Characteristics Cooperative;Appropriate to situation;Irritable  Mood Anxious;Irritable  Aggressive Behavior  Effect No apparent injury  Thought Process  Coherency Circumstantial  Content WDL  Delusions None reported or observed  Perception WDL  Hallucination None reported or observed  Judgment Poor  Confusion None  Danger to Self  Current suicidal ideation? Denies  Danger to Others  Danger to Others None reported or observed

## 2023-01-03 NOTE — Telephone Encounter (Signed)
Contacted pt in regards to appt. Contacted pt to see if she is able to come in earlier. Pt didn't answer lvm

## 2023-01-03 NOTE — Consult Note (Signed)
Initial Consultation Note   Patient: Cory Floyd [id number] DOB: [birth year] PCP: Pcp, No DOA: 12/30/2022 DOS: the patient was seen and examined on 01/03/2023 Primary service: Gonzella Lex, MD  Referring physician: Dr. Weber Cooks Reason for consult: Right [facility] Cellulitis   Phone Consultation  Discussed with Dr. Weber Cooks regarding Mr. Laurin Coder. He has a history of bilateral antecubital fossa cellulitis in the setting of intravenous substance abuse s/p debridement on 12/29/2022.      Right AC cellulitis with wound dehiscence:  - Recommend starting Doxycycline 100 mg BID  - Consider Orthopedic consultation for re-approximation  - Repeat vitals signs to ensure stability  - If no improvement in the next 3-5 days or evidence of fever, hypotension or tachycardia, please re-consult for consideration of IV antibiotics.   Author: Jose Persia, MD 01/03/2023 4:33 PM  For on call review [email or web address].

## 2023-01-03 NOTE — Progress Notes (Signed)
[facility] MD Progress Note  01/03/2023 4:04 PM Cory Floyd  MRN:  [id number] Subjective: Follow-up 47 year old man with bipolar disorder.  Patient remains in bed almost all the time.  When I going to see him he is awake but does not have much to say.  He says he thinks his medicines are fine and he denies any suicidal thought and does not feel particularly depressed but says he is just feeling exhausted and run down.  Vital signs stable.  Nursing had noticed that his wounds in his antecubital fossa particularly on the right side might be looking worse.  Patient confirms this. 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)    COVID-19    Depressed    Diabetes mellitus without complication ([place])    Dyspnea    Fatty liver    Hypertension    Schizophrenia ([place])     Past Surgical History:  Procedure Laterality Date   COLONOSCOPY  02/05/2014   diverticulosis, hyperplastic polyp x 2   fracture arm Right    INCISION AND DRAINAGE ABSCESS Bilateral 12/29/2022   Procedure: INCISION AND DRAINAGE ABSCESS BILATERAL ARMS;  Surgeon: Shona Needles, MD;  Location: [place];  Service: Orthopedics;  Laterality: Bilateral;   KNEE SURGERY Right 2013   NASAL SEPTUM SURGERY     NASAL TURBINATE REDUCTION Bilateral 12/12/2016   Procedure: TURBINATE REDUCTION/SUBMUCOSAL RESECTION;  Surgeon: Beverly Gust, MD;  Location: [facility];  Service: ENT;  Laterality: Bilateral;   Family  History:  Family History  Problem Relation Age of Onset   Hepatitis C Mother    Cancer Mother    Cancer Maternal Aunt    Hypertension Maternal Grandmother    Family Psychiatric  History: See previous Social History:  Social History   Substance and Sexual Activity  Alcohol Use Yes   Comment: 1/2-5th pint of liquor     Social History   Substance and Sexual Activity  Drug Use Yes   Types: Cocaine, Marijuana   Comment: heroin    Social History   Socioeconomic History   Marital status: Divorced    Spouse name: Not on file   Number of children: Not on file   Years of education: Not on file   Highest education level: Not on file  Occupational History   Not on file  Tobacco Use   Smoking status: Every Day    Packs/day: 1.00    Years: 20.00    Total pack years: 20.00    Types: Cigarettes   Smokeless tobacco: Never  Vaping Use   Vaping Use: Never used  Substance and Sexual Activity   Alcohol use: Yes    Comment: 1/2-5th pint of liquor   Drug use: Yes    Types: Cocaine, Marijuana    Comment: heroin   Sexual activity: Yes    Birth control/protection: None  Other Topics Concern   Not on file  Social History Narrative   Not on file  Social Determinants of Health   Financial Resource Strain: Not on file  Food Insecurity: Food Insecurity Present (12/30/2022)   Hunger Vital Sign    Worried About Running Out of Food in the Last Year: Often true    Ran Out of Food in the Last Year: Often true  Transportation Needs: Unmet Transportation Needs (12/30/2022)   PRAPARE - Hydrologist (Medical): Yes    Lack of Transportation (Non-Medical): Yes  Physical Activity: Not on file  Stress: Not on file  Social Connections: Not on file   Additional Social History:                         Sleep: Fair  Appetite:  Fair  Current Medications: Current Facility-Administered Medications  Medication Dose Route Frequency Provider Last Rate Last  Admin   acetaminophen (TYLENOL) tablet 650 mg  650 mg Oral Q6H PRN [name], [name]       alum & mag hydroxide-simeth (MAALOX/MYLANTA) 200-200-20 MG/5ML suspension 30 mL  30 mL Oral Q4H PRN [name], [name]       ARIPiprazole (ABILIFY) tablet 10 mg  10 mg Oral Daily [name], [name]   10 mg at 01/03/23 [id number]   cloNIDine (CATAPRES) tablet 0.2 mg  0.2 mg Oral Q6H PRN [name], DO       diphenhydrAMINE (BENADRYL) capsule 50 mg  50 mg Oral TID PRN [name], [name]       Or   diphenhydrAMINE (BENADRYL) injection 50 mg  50 mg Intramuscular TID PRN [name], [name]       divalproex (DEPAKOTE ER) 24 hr tablet 1,000 mg  1,000 mg Oral Daily [name], [name]   1,000 mg at 01/03/23 0804   fluticasone (FLONASE) 50 MCG/ACT nasal spray 1 spray  1 spray Each Nare Daily [name], [name]   1 spray at [id number]   folic acid (FOLVITE) tablet 1 mg  1 mg Oral Daily [name], [name]   1 mg at 01/03/23 [id number]   haloperidol (HALDOL) tablet 5 mg  5 mg Oral TID PRN [name], [name]       Or   haloperidol lactate (HALDOL) injection 5 mg  5 mg Intramuscular TID PRN [name], [name]       ibuprofen (ADVIL) tablet 600 mg  600 mg Oral Q6H PRN Drevon Plog T, MD       ipratropium (ATROVENT) nebulizer solution 0.5 mg  0.5 mg Nebulization BID PRN Shakira Los, [name], MD       LORazepam (ATIVAN) tablet 2 mg  2 mg Oral TID PRN [name], [name]   2 mg at 01/01/23 2104   Or   LORazepam (ATIVAN) injection 2 mg  2 mg Intramuscular TID PRN [name], [name]       losartan (COZAAR) tablet 50 mg  50 mg Oral Daily [name], DO   50 mg at 01/03/23 0803   magnesium hydroxide (MILK OF MAGNESIA) suspension 30 mL  30 mL Oral Daily PRN [name], [name]       metoprolol succinate (TOPROL-XL) 24 hr tablet 50 mg  50 mg Oral Daily [name], DO   50 mg at 01/02/23 0959   mirtazapine (REMERON) tablet 15 mg  15 mg Oral QHS  [name], [name]   15 mg at 01/03/23 0021   multivitamin with minerals tablet 1 tablet  1 tablet Oral Daily [name], Kerrie [place]  1 tablet at 01/03/23 [id number]   thiamine (VITAMIN B1) tablet 100 mg  100 mg Oral Daily [name], [name]   100 mg at 01/03/23 0804   traZODone (DESYREL) tablet 150 mg  150 mg Oral QHS PRN [name], DO   150 mg at 01/03/23 [id number]    Lab Results: No results found for this or any previous visit (from the past 48 hour(s)).  Blood Alcohol level:  Lab Results  Component Value Date   ETH <10 12/19/2022   ETH <10 [id number]    Metabolic Disorder Labs: Lab Results  Component Value Date   HGBA1C 5.8 (H) 12/31/2022   MPG 120 12/31/2022   MPG 111 04/02/2021   No results found for: "PROLACTIN" Lab Results  Component Value Date   CHOL 149 12/31/2022   TRIG 160 (H) 12/31/2022   HDL 26 (L) 12/31/2022   CHOLHDL 5.7 12/31/2022   VLDL 32 12/31/2022   LDLCALC 91 12/31/2022   LDLCALC 122 (H) 05/04/2021    Physical Findings: AIMS: Facial and Oral Movements Muscles of Facial Expression: None, normal Lips and Perioral Area: None, normal Jaw: None, normal Tongue: None, normal,Extremity Movements Upper (arms, wrists, hands, fingers): None, normal Lower (legs, knees, ankles, toes): None, normal, Trunk Movements Neck, shoulders, hips: None, normal, Overall Severity Severity of abnormal movements (highest score from questions above): None, normal Incapacitation due to abnormal movements: None, normal Patient's awareness of abnormal movements (rate only patient's report): No Awareness, Dental Status Current problems with teeth and/or dentures?: No Does patient usually wear dentures?: No  CIWA:    COWS:     Musculoskeletal: Strength & Muscle Tone: within normal limits Gait & Station: normal Patient leans: N/A  Psychiatric Specialty Exam:  Presentation  General Appearance:  Appropriate for Environment  Eye  Contact: Good  Speech: Clear and Coherent  Speech Volume: Normal  Handedness: -- (unknown)   Mood and Affect  Mood: Depressed  Affect: Congruent   Thought Process  Thought Processes: Coherent; Goal Directed; Linear  Descriptions of Associations:Intact  Orientation:Full (Time, Place and Person)  Thought Content:Logical  History of Schizophrenia/Schizoaffective disorder:No data recorded Duration of Psychotic Symptoms:No data recorded Hallucinations:No data recorded Ideas of Reference:None  Suicidal Thoughts:No data recorded Homicidal Thoughts:No data recorded  Sensorium  Memory: Immediate Fair; Recent Fair  Judgment: Fair  Insight: Good   Executive Functions  Concentration: Good  Attention Span: Good  Recall: Good  Fund of Knowledge: Good  Language: Good   Psychomotor Activity  Psychomotor Activity:No data recorded  Assets  Assets: Communication Skills; Leisure Time; Resilience   Sleep  Sleep:No data recorded   Physical Exam: Physical Exam Vitals and nursing note reviewed.  Constitutional:      Appearance: Normal appearance.  HENT:     Head: Normocephalic and atraumatic.     Mouth/Throat:     Pharynx: Oropharynx is clear.  Eyes:     Pupils: Pupils are equal, round, and reactive to light.  Cardiovascular:     Rate and Rhythm: Normal rate and regular rhythm.  Pulmonary:     Effort: Pulmonary effort is normal.     Breath sounds: Normal breath sounds.  Abdominal:     General: Abdomen is flat.     Palpations: Abdomen is soft.  Musculoskeletal:        General: Normal range of motion.  Skin:    General: Skin is warm and dry.  Neurological:     General: No focal deficit present.     Mental Status:  He is alert. Mental status is at baseline.  Psychiatric:        Attention and Perception: He is inattentive.        Mood and Affect: Mood normal. Affect is blunt.        Speech: Speech is delayed.        Behavior: Behavior is  slowed.        Thought Content: Thought content normal. Thought content does not include suicidal ideation.        Cognition and Memory: Cognition normal.    Review of Systems  Constitutional:  Positive for malaise/fatigue.  HENT: Negative.    Eyes: Negative.   Respiratory: Negative.    Cardiovascular: Negative.   Gastrointestinal: Negative.   Musculoskeletal: Negative.   Skin: Negative.   Neurological: Negative.   Psychiatric/Behavioral:  Positive for depression. Negative for suicidal ideas.    Blood pressure 102/68, pulse 91, temperature 97.7 F (36.5 C), temperature source Oral, resp. rate 20, height '5\' 10"'$  (1.778 m), weight 97.5 kg, SpO2 98 %. Body mass index is 30.85 kg/m.   Treatment Plan Summary: Medication management and Plan no change to psychiatric medicine.  Encourage him to get up out of bed and be more interactive on the unit.  I am going to see if the orthopedic surgeon team who did the procedure back on the eighth could come by and take a look at the wounds.  Alethia Berthold, MD 01/03/2023, 4:04 PM

## 2023-01-03 NOTE — Progress Notes (Signed)
Patient ID: Cory Floyd, male   DOB: September 08, 1976, 47 y.o.   MRN: [id number] Additional note: I looked at the patient's wounds on bilateral antecubital fossa.  The 1 on the right looks like it is having some breakdown and the patient says it looks worse.  Spoke with hospitalist spoke with orthopedic surgery.  Plan for now is to start doxycycline 100 mg twice a day and do daily dressing changes.  Will follow any other recommendations provided.

## 2023-01-03 NOTE — Plan of Care (Signed)
  Problem: Education: Goal: Knowledge of General Education information will improve Description: Including pain rating scale, medication(s)/side effects and non-pharmacologic comfort measures Outcome: Progressing   Problem: Activity: Goal: Risk for activity intolerance will decrease Outcome: Not Progressing   Problem: Coping: Goal: Level of anxiety will decrease Outcome: Not Progressing   

## 2023-01-04 DIAGNOSIS — F319 Bipolar disorder, unspecified: Secondary | ICD-10-CM

## 2023-01-04 NOTE — Progress Notes (Signed)
[facility] MD Progress Note  01/04/2023 2:20 PM Cory Floyd  MRN:  [id number] Subjective: Follow-up 47 year old man with bipolar disorder and substance abuse.  Continues to stay in bed almost all of the time.  Gets up and eats.  When I went in he was awake and conversant.  Pleasant affect but does not show any interest in getting out of bed.  Says he is not having hallucinations not feeling particularly depressed not suicidal.  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   Anginal pain ([place])    Arthritis    Bipolar 1 disorder ([place])    COVID-19    Depressed    Diabetes mellitus without complication ([place])    Dyspnea    Fatty liver    Hypertension    Schizophrenia ([place])     Past Surgical History:  Procedure Laterality Date   COLONOSCOPY  02/05/2014   diverticulosis, hyperplastic polyp x 2   fracture arm Right    INCISION AND DRAINAGE ABSCESS Bilateral 12/29/2022   Procedure: INCISION AND DRAINAGE ABSCESS BILATERAL ARMS;  Surgeon: Shona Needles, MD;  Location: [place];  Service: Orthopedics;  Laterality: Bilateral;   KNEE SURGERY Right 2013   NASAL SEPTUM SURGERY     NASAL TURBINATE REDUCTION Bilateral 12/12/2016   Procedure: TURBINATE REDUCTION/SUBMUCOSAL RESECTION;  Surgeon: Beverly Gust, MD;  Location: [facility];  Service: ENT;  Laterality: Bilateral;   Family History:  Family History  Problem Relation Age of Onset   Hepatitis C Mother     Cancer Mother    Cancer Maternal Aunt    Hypertension Maternal Grandmother    Family Psychiatric  History: See previous Social History:  Social History   Substance and Sexual Activity  Alcohol Use Yes   Comment: 1/2-5th pint of liquor     Social History   Substance and Sexual Activity  Drug Use Yes   Types: Cocaine, Marijuana   Comment: heroin    Social History   Socioeconomic History   Marital status: Divorced    Spouse name: Not on file   Number of children: Not on file   Years of education: Not on file   Highest education level: Not on file  Occupational History   Not on file  Tobacco Use   Smoking status: Every Day    Packs/day: 1.00    Years: 20.00    Additional pack years: 0.00    Total pack years: 20.00    Types: Cigarettes   Smokeless tobacco: Never  Vaping Use   Vaping Use: Never used  Substance and Sexual Activity   Alcohol use: Yes    Comment: 1/2-5th pint of liquor   Drug use: Yes    Types: Cocaine, Marijuana    Comment: heroin   Sexual activity: Yes    Birth control/protection: None  Other Topics Concern   Not on file  Social History Narrative   Not on file   Social Determinants of Health  Financial Resource Strain: Not on file  Food Insecurity: Food Insecurity Present (12/30/2022)   Hunger Vital Sign    Worried About Running Out of Food in the Last Year: Often true    Ran Out of Food in the Last Year: Often true  Transportation Needs: Unmet Transportation Needs (12/30/2022)   PRAPARE - Hydrologist (Medical): Yes    Lack of Transportation (Non-Medical): Yes  Physical Activity: Not on file  Stress: Not on file  Social Connections: Not on file   Additional Social History:                         Sleep: Fair  Appetite:  Fair  Current Medications: Current Facility-Administered Medications  Medication Dose Route Frequency Provider Last Rate Last Admin   acetaminophen (TYLENOL) tablet 650 mg  650  mg Oral Q6H PRN [name], [name]   650 mg at 01/04/23 0931   alum & mag hydroxide-simeth (MAALOX/MYLANTA) 200-200-20 MG/5ML suspension 30 mL  30 mL Oral Q4H PRN [name], [name]       ARIPiprazole (ABILIFY) tablet 10 mg  10 mg Oral Daily [name], [name]   10 mg at 01/04/23 0931   cloNIDine (CATAPRES) tablet 0.2 mg  0.2 mg Oral Q6H PRN [name], DO       diphenhydrAMINE (BENADRYL) capsule 50 mg  50 mg Oral TID PRN [name], [name]       Or   diphenhydrAMINE (BENADRYL) injection 50 mg  50 mg Intramuscular TID PRN [name], [name]       divalproex (DEPAKOTE ER) 24 hr tablet 1,000 mg  1,000 mg Oral Daily [name], [name]   1,000 mg at 01/04/23 0931   doxycycline (VIBRA-TABS) tablet 100 mg  100 mg Oral Q12H Aubrie Lucien T, MD   100 mg at 01/04/23 0931   fluticasone (FLONASE) 50 MCG/ACT nasal spray 1 spray  1 spray Each Nare Daily [name], [name]   1 spray at [id number]   folic acid (FOLVITE) tablet 1 mg  1 mg Oral Daily [name], [name]   1 mg at 01/04/23 0931   haloperidol (HALDOL) tablet 5 mg  5 mg Oral TID PRN [name], [name]       Or   haloperidol lactate (HALDOL) injection 5 mg  5 mg Intramuscular TID PRN [name], [name]       ibuprofen (ADVIL) tablet 600 mg  600 mg Oral Q6H PRN Otha Rickles T, MD       ipratropium (ATROVENT) nebulizer solution 0.5 mg  0.5 mg Nebulization BID PRN Janari Yamada, [name], MD       LORazepam (ATIVAN) tablet 2 mg  2 mg Oral TID PRN [name], [name]   2 mg at 01/01/23 2104   Or   LORazepam (ATIVAN) injection 2 mg  2 mg Intramuscular TID PRN [name], [name]       losartan (COZAAR) tablet 50 mg  50 mg Oral Daily [name], DO   50 mg at 01/04/23 0931   magnesium hydroxide (MILK OF MAGNESIA) suspension 30 mL  30 mL Oral Daily PRN [name], [name]       metoprolol succinate (TOPROL-XL) 24 hr tablet 50 mg  50 mg Oral Daily [name], DO   50 mg at  01/02/23 0959   mirtazapine (REMERON) tablet 15 mg  15 mg Oral QHS [name], [name]   15 mg at  01/03/23 2134   multivitamin with minerals tablet 1 tablet  1 tablet Oral Daily [name], [name]   1 tablet at 01/04/23 [id number]   thiamine (VITAMIN B1) tablet 100 mg  100 mg Oral Daily [name], [name]   100 mg at 01/04/23 0931   traZODone (DESYREL) tablet 150 mg  150 mg Oral QHS PRN [name], DO   150 mg at 01/03/23 2134    Lab Results: No results found for this or any previous visit (from the past 48 hour(s)).  Blood Alcohol level:  Lab Results  Component Value Date   ETH <10 12/19/2022   ETH <10 [id number]    Metabolic Disorder Labs: Lab Results  Component Value Date   HGBA1C 5.8 (H) 12/31/2022   MPG 120 12/31/2022   MPG 111 04/02/2021   No results found for: "PROLACTIN" Lab Results  Component Value Date   CHOL 149 12/31/2022   TRIG 160 (H) 12/31/2022   HDL 26 (L) 12/31/2022   CHOLHDL 5.7 12/31/2022   VLDL 32 12/31/2022   LDLCALC 91 12/31/2022   LDLCALC 122 (H) 05/04/2021    Physical Findings: AIMS: Facial and Oral Movements Muscles of Facial Expression: None, normal Lips and Perioral Area: None, normal Jaw: None, normal Tongue: None, normal,Extremity Movements Upper (arms, wrists, hands, fingers): None, normal Lower (legs, knees, ankles, toes): None, normal, Trunk Movements Neck, shoulders, hips: None, normal, Overall Severity Severity of abnormal movements (highest score from questions above): None, normal Incapacitation due to abnormal movements: None, normal Patient's awareness of abnormal movements (rate only patient's report): No Awareness, Dental Status Current problems with teeth and/or dentures?: No Does patient usually wear dentures?: No  CIWA:    COWS:     Musculoskeletal: Strength & Muscle Tone: within normal limits Gait & Station: normal Patient leans: N/A  Psychiatric Specialty Exam:  Presentation  General Appearance:   Appropriate for Environment  Eye Contact: Good  Speech: Clear and Coherent  Speech Volume: Normal  Handedness: -- (unknown)   Mood and Affect  Mood: Depressed  Affect: Congruent   Thought Process  Thought Processes: Coherent; Goal Directed; Linear  Descriptions of Associations:Intact  Orientation:Full (Time, Place and Person)  Thought Content:Logical  History of Schizophrenia/Schizoaffective disorder:No data recorded Duration of Psychotic Symptoms:No data recorded Hallucinations:No data recorded Ideas of Reference:None  Suicidal Thoughts:No data recorded Homicidal Thoughts:No data recorded  Sensorium  Memory: Immediate Fair; Recent Fair  Judgment: Fair  Insight: Good   Executive Functions  Concentration: Good  Attention Span: Good  Recall: Good  Fund of Knowledge: Good  Language: Good   Psychomotor Activity  Psychomotor Activity:No data recorded  Assets  Assets: Communication Skills; Leisure Time; Resilience   Sleep  Sleep:No data recorded   Physical Exam: Physical Exam Vitals and nursing note reviewed.  Constitutional:      Appearance: Normal appearance.  HENT:     Head: Normocephalic and atraumatic.     Mouth/Throat:     Pharynx: Oropharynx is clear.  Eyes:     Pupils: Pupils are equal, round, and reactive to light.  Cardiovascular:     Rate and Rhythm: Normal rate and regular rhythm.  Pulmonary:     Effort: Pulmonary effort is normal.     Breath sounds: Normal breath sounds.  Abdominal:     General: Abdomen is flat.     Palpations: Abdomen is soft.  Musculoskeletal:        General: Normal range of motion.  Skin:    General: Skin is  warm and dry.  Neurological:     General: No focal deficit present.     Mental Status: He is alert. Mental status is at baseline.  Psychiatric:        Mood and Affect: Mood normal.        Thought Content: Thought content normal.    Review of Systems  Constitutional:  Negative.   HENT: Negative.    Eyes: Negative.   Respiratory: Negative.    Cardiovascular: Negative.   Gastrointestinal: Negative.   Musculoskeletal: Negative.   Skin: Negative.   Neurological: Negative.   Psychiatric/Behavioral: Negative.     Blood pressure 112/76, pulse 95, temperature 97.7 F (36.5 C), temperature source Oral, resp. rate 20, height '5\' 10"'$  (1.778 m), weight 97.5 kg, SpO2 98 %. Body mass index is 30.85 kg/m.   Treatment Plan Summary: Plan patient was encouraged to please try getting out of bed and interacting and maybe attending groups and talking to social work if he wants to discuss discharge planning.  He says he feels his medicines are fine and has no reason to want to change them.  Alethia Berthold, MD 01/04/2023, 2:20 PM

## 2023-01-04 NOTE — [facility] Group Notes (Signed)
[place] Group Notes:  (Nursing/MHT/Case Management/Adjunct)  Date:  01/04/2023  Time:  9:39 AM  Type of Therapy:   community meeting  Participation Level:  Did Not Attend    Antonieta Pert 01/04/2023, 9:39 AM

## 2023-01-04 NOTE — Progress Notes (Signed)
Pt denies SI/HI/AVH and verbally agrees to approach staff if these become apparent or before harming themselves/others. Rates depression 0/10. Rates anxiety 0/10. Rates pain 0/10. Pt has been in his room for most of the day. Pt encouraged to get up but stated that he just wants to stay laying down. Pt has gotten up for breakfast and dinner. Pt seen on the phone once. Pt seemed to be talking to himself this morning during dressing change and after. Scheduled medications administered to pt, per MD orders. RN provided support and encouragement to pt. Q15 min safety checks implemented and continued. Pt is safe on the unit. Plan of care on going and no other concerns expressed at this time.  01/04/23 0931  Psych Admission Type (Psych Patients Only)  Admission Status Voluntary  Psychosocial Assessment  Patient Complaints None  Eye Contact Brief  Facial Expression Flat;Sad  Affect Blunted;Sad  Speech Logical/coherent  Interaction Forwards little;Guarded;Minimal;Isolative  Motor Activity Slow  Appearance/Hygiene Unremarkable  Behavior Characteristics Cooperative;Appropriate to situation;Calm  Mood Irritable  Aggressive Behavior  Effect No apparent injury  Thought Process  Coherency Disorganized  Content WDL  Delusions None reported or observed  Perception WDL  Hallucination None reported or observed  Judgment Poor  Confusion None  Danger to Self  Current suicidal ideation? Denies  Danger to Others  Danger to Others None reported or observed

## 2023-01-04 NOTE — Plan of Care (Signed)
  Problem: Education: Goal: Knowledge of General Education information will improve Description: Including pain rating scale, medication(s)/side effects and non-pharmacologic comfort measures Outcome: Progressing   Problem: Coping: Goal: Level of anxiety will decrease Outcome: Progressing   Problem: Activity: Goal: Risk for activity intolerance will decrease Outcome: Not Progressing   Problem: Nutrition: Goal: Adequate nutrition will be maintained Outcome: Not Progressing   Problem: Pain Managment: Goal: General experience of comfort will improve Outcome: Not Progressing

## 2023-01-04 NOTE — Progress Notes (Signed)
[facility] LCSW Progress Note   01/04/2023    5:22 PM  Cory Floyd   [id number]   Type of Contact and Topic:  Case Management   CSW provided patient with contact information for substance use half-way houses. Writer encouraged patient to contact numbers provided. Situation ongoing, CSW will continue to monitor and update note as more information becomes available. CSW team to follow up at a later time.     Signed:  [name], MSW, LCSW, LCAS 01/04/2023 5:22 PM

## 2023-01-04 NOTE — Group Note (Signed)
Recreation Therapy Group Note   Group Topic:Coping Skills  Group Date: 01/04/2023 Start Time: 1000 End Time: 1045 Facilitators: Vilma Prader, LRT, CTRS Location:  Craft Room  Group Description: Mind Map.  Patient was provided a blank template of a diagram with 32 blank boxes in a tiered system, branching from the center (similar to a bubble chart). LRT directed patients to label the middle of the diagram "Coping Skills". LRT and patients then came up with 8 different coping skills as examples. Pt were directed to record their coping skills in the 2nd tier boxes closest to the center.  Patients would then share their coping skills with the group as LRT wrote them out. LRT gave a handout of 100 different coping skills at the end of group.   Affect/Mood: N/A   Participation Level: Did not attend    Clinical Observations/Individualized Feedback: Cory Floyd did not attend group due to resting in his room.  Plan: Continue to engage patient in RT group sessions 2-3x/week.   Vilma Prader, LRT, CTRS 01/04/2023 11:10 AM

## 2023-01-05 NOTE — Progress Notes (Signed)
Subjective: Right forearm wound dehiscence   Objective: Vital signs in last 24 hours: Temp:  [99.4 F (37.4 C)] 99.4 F (37.4 C) (03/15 0806) Pulse Rate:  [95-100] 100 (03/15 0806) Resp:  [18] 18 (03/15 0806) BP: (103-112)/(69-76) 103/69 (03/15 0806) SpO2:  [98 %-99 %] 99 % (03/15 0806)  Intake/Output from previous day: No intake/output data recorded. Intake/Output this shift: No intake/output data recorded.  No results for input(s): "HGB" in the last 72 hours. No results for input(s): "WBC", "RBC", "HCT", "PLT" in the last 72 hours. No results for input(s): "NA", "K", "CL", "CO2", "BUN", "CREATININE", "GLUCOSE", "CALCIUM" in the last 72 hours. No results for input(s): "LABPT", "INR" in the last 72 hours.  Physical Exam: Right upper extremity There is a now 1 to 2 cm area of wound dehiscence over the [facility] fossa with improved erythema compared to yesterday with no active drainage or fluctuance.  1 suture removed from the central area of dehiscence without any complication.  New dry dressing applied.  We left the proximal and distal sutures intact.  No streaking erythema or expanding beefy erythema noted Neurovascularly intact to left upper extremity with soft compartments AIN/PIN/U/R/M nerves intact + Radial pulse  Assessment: Bilateral AC fossa status post surgical I&D Right AC fossa wound dehiscence  Plan: Continue oral antibiotics per medicine the doxycycline seems to be working and his arm is much improved today. No active sign of contained infection or any active ongoing purulence Recommend continued healing by secondary intention with dry dressing changes as needed to keep the wound clean.  The left arm at this point is well-closed and does not need any dressing. Sutures to be removed in about 1 more week if patient to be discharged in the week would recommend follow-up as previously planned with Dr. Doreatha Martin. If patient remains inpatient next week please reconsult and we  will come and take out the sutures.   Steffanie Rainwater 01/05/2023, 8:35 AM

## 2023-01-05 NOTE — Group Note (Signed)
LCSW Group Therapy Note  Group Date: 01/04/2023 Start Time: 1300 End Time: 1400   Type of Therapy and Topic:  Group Therapy - How To Cope with Nervousness about Discharge   Participation Level:  Did Not Attend   Description of Group This process group involved identification of patients' feelings about discharge. Some of them are scheduled to be discharged soon, while others are new admissions, but each of them was asked to share thoughts and feelings surrounding discharge from the hospital. One common theme was that they are excited at the prospect of going home, while another was that many of them are apprehensive about sharing why they were hospitalized. Patients were given the opportunity to discuss these feelings with their peers in preparation for discharge.  Therapeutic Goals  Patient will identify their overall feelings about pending discharge. Patient will think about how they might proactively address issues that they believe will once again arise once they get home (i.e. with parents). Patients will participate in discussion about having hope for change.   Summary of Patient Progress:   Patient did not attend group despite encouraged participation.   Therapeutic Modalities Cognitive Behavioral Therapy   Larose Kells 01/05/2023  2:32 PM

## 2023-01-05 NOTE — Group Note (Signed)
Recreation Therapy Group Note   Group Topic:Emotion Expression  Group Date: 01/05/2023 Start Time: 1000 End Time: 1030 Facilitators: Vilma Prader, LRT, CTRS Location:  Craft Room  Group Description: Gratitude Journaling. Patients and LRT discussed what gratitude means, how we can express it and what it means to [place], personally. LRT gave an education handout on the definition of gratitude that also gave different examples of gratitude exercises that they could try. One of the examples was "Gratitude Letter", which prompted you to write a letter to someone you appreciate. LRT played soft music while everyone wrote their letter. Once letter was completed, LRT encouraged people to read their letter, if they wanted to, or share who they wrote it to, at minimum. LRT and pts processed how showing gratitude towards themselves, and others can be applied to everyday life post-discharge.   Affect/Mood: N/A   Participation Level: Did not attend    Clinical Observations/Individualized Feedback: Cory Floyd did not attend group due to resting in his room.  Plan: Continue to engage patient in RT group sessions 2-3x/week.   Vilma Prader, LRT, CTRS 01/05/2023 10:50 AM

## 2023-01-05 NOTE — Progress Notes (Signed)
Patient has been asked multiple times, to come up and get his medication. He has not budged. Patient gets up to eat and then goes to back to his room and gets in bed. MD is aware.

## 2023-01-05 NOTE — Plan of Care (Signed)
D- Patient alert and oriented. Patient presented in a pleasant mood on assessment stating that he slept ok last night and had complaints of bilateral arm pain. Patient rated his pain a "7/10", but he did not request any PRN pain medication. Patient denied anxiety, but endorsed depression, stating that he deals with depression pretty much all the time. Patient also denied SI, HI, AVH at this time. Patient had no stated goals for today.  A- Scheduled medications administered to patient, per MD orders. Support and encouragement provided.  Routine safety checks conducted every 15 minutes.  Patient informed to notify staff with problems or concerns.  R- No adverse drug reactions noted. Patient contracts for safety at this time. Patient compliant with medications and treatment plan. Patient receptive, calm, and cooperative. Patient interacts well with others on the unit.  Patient remains safe at this time.  Problem: Education: Goal: Knowledge of General Education information will improve Description: Including pain rating scale, medication(s)/side effects and non-pharmacologic comfort measures Outcome: Progressing   Problem: Health Behavior/Discharge Planning: Goal: Ability to manage health-related needs will improve Outcome: Progressing   Problem: Clinical Measurements: Goal: Ability to maintain clinical measurements within normal limits will improve Outcome: Progressing Goal: Will remain free from infection Outcome: Progressing Goal: Diagnostic test results will improve Outcome: Progressing Goal: Respiratory complications will improve Outcome: Progressing Goal: Cardiovascular complication will be avoided Outcome: Progressing   Problem: Activity: Goal: Risk for activity intolerance will decrease Outcome: Progressing   Problem: Nutrition: Goal: Adequate nutrition will be maintained Outcome: Progressing   Problem: Coping: Goal: Level of anxiety will decrease Outcome: Progressing    Problem: Elimination: Goal: Will not experience complications related to bowel motility Outcome: Progressing Goal: Will not experience complications related to urinary retention Outcome: Progressing   Problem: Pain Managment: Goal: General experience of comfort will improve Outcome: Progressing   Problem: Safety: Goal: Ability to remain free from injury will improve Outcome: Progressing   Problem: Skin Integrity: Goal: Risk for impaired skin integrity will decrease Outcome: Progressing

## 2023-01-05 NOTE — Progress Notes (Signed)
[facility] MD Progress Note  01/05/2023 4:55 PM Cory Floyd  MRN:  [id number] Subjective: Patient seen.  He is up out of bed today socializing with others.  Feeling much better physically.  Affect upbeat euthymic.  Denies suicidal thoughts or psychosis.  Primarily focused on wanting to discuss a place to live. 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   Anginal pain ([place])    Arthritis    Bipolar 1 disorder ([place])    COVID-19    Depressed    Diabetes mellitus without complication ([place])    Dyspnea    Fatty liver    Hypertension    Schizophrenia ([place])     Past Surgical History:  Procedure Laterality Date   COLONOSCOPY  02/05/2014   diverticulosis, hyperplastic polyp x 2   fracture arm Right    INCISION AND DRAINAGE ABSCESS Bilateral 12/29/2022   Procedure: INCISION AND DRAINAGE ABSCESS BILATERAL ARMS;  Surgeon: Shona Needles, MD;  Location: [place];  Service: Orthopedics;  Laterality: Bilateral;   KNEE SURGERY Right 2013   NASAL SEPTUM SURGERY     NASAL TURBINATE REDUCTION Bilateral 12/12/2016   Procedure: TURBINATE REDUCTION/SUBMUCOSAL RESECTION;  Surgeon: Beverly Gust, MD;  Location: [facility];  Service: ENT;  Laterality: Bilateral;   Family History:  Family History  Problem Relation Age of Onset   Hepatitis C Mother    Cancer Mother    Cancer Maternal Aunt    Hypertension Maternal Grandmother    Family Psychiatric  History: See previous Social History:  Social History   Substance  and Sexual Activity  Alcohol Use Yes   Comment: 1/2-5th pint of liquor     Social History   Substance and Sexual Activity  Drug Use Yes   Types: Cocaine, Marijuana   Comment: heroin    Social History   Socioeconomic History   Marital status: Divorced    Spouse name: Not on file   Number of children: Not on file   Years of education: Not on file   Highest education level: Not on file  Occupational History   Not on file  Tobacco Use   Smoking status: Every Day    Packs/day: 1.00    Years: 20.00    Additional pack years: 0.00    Total pack years: 20.00    Types: Cigarettes   Smokeless tobacco: Never  Vaping Use   Vaping Use: Never used  Substance and Sexual Activity   Alcohol use: Yes    Comment: 1/2-5th pint of liquor   Drug use: Yes    Types: Cocaine, Marijuana    Comment: heroin   Sexual activity: Yes    Birth control/protection: None  Other Topics Concern   Not on file  Social History Narrative   Not on file   Social Determinants of Health   Financial Resource Strain: Not on file  Food Insecurity: Food Insecurity Present (12/30/2022)   Hunger Vital Sign    Worried About Running Out of Food in the Last Year: Often true  Ran Out of Food in the Last Year: Often true  Transportation Needs: Unmet Transportation Needs (12/30/2022)   PRAPARE - Hydrologist (Medical): Yes    Lack of Transportation (Non-Medical): Yes  Physical Activity: Not on file  Stress: Not on file  Social Connections: Not on file   Additional Social History:                         Sleep: Fair  Appetite:  Fair  Current Medications: Current Facility-Administered Medications  Medication Dose Route Frequency Provider Last Rate Last Admin   acetaminophen (TYLENOL) tablet 650 mg  650 mg Oral Q6H PRN [name], [name]   650 mg at 01/04/23 0931   alum & mag hydroxide-simeth (MAALOX/MYLANTA) 200-200-20 MG/5ML suspension 30 mL  30 mL Oral Q4H PRN  [name], [name]       ARIPiprazole (ABILIFY) tablet 10 mg  10 mg Oral Daily [name], [name]   10 mg at 01/05/23 1611   cloNIDine (CATAPRES) tablet 0.2 mg  0.2 mg Oral Q6H PRN [name], DO       diphenhydrAMINE (BENADRYL) capsule 50 mg  50 mg Oral TID PRN [name], [name]       Or   diphenhydrAMINE (BENADRYL) injection 50 mg  50 mg Intramuscular TID PRN [name], [name]       divalproex (DEPAKOTE ER) 24 hr tablet 1,000 mg  1,000 mg Oral Daily [name], [name]   1,000 mg at 01/05/23 1610   doxycycline (VIBRA-TABS) tablet 100 mg  100 mg Oral Q12H Janthony Holleman T, MD   100 mg at 01/05/23 1610   fluticasone (FLONASE) 50 MCG/ACT nasal spray 1 spray  1 spray Each Nare Daily [name], [name]   1 spray at [id number]   folic acid (FOLVITE) tablet 1 mg  1 mg Oral Daily [name], [name]   1 mg at 01/05/23 1611   haloperidol (HALDOL) tablet 5 mg  5 mg Oral TID PRN [name], [name]       Or   haloperidol lactate (HALDOL) injection 5 mg  5 mg Intramuscular TID PRN [name], [name]       ibuprofen (ADVIL) tablet 600 mg  600 mg Oral Q6H PRN Deepti Gunawan T, MD       ipratropium (ATROVENT) nebulizer solution 0.5 mg  0.5 mg Nebulization BID PRN Nevaeha Finerty, [name], MD       LORazepam (ATIVAN) tablet 2 mg  2 mg Oral TID PRN [name], [name]   2 mg at 01/01/23 2104   Or   LORazepam (ATIVAN) injection 2 mg  2 mg Intramuscular TID PRN [name], [name]       losartan (COZAAR) tablet 50 mg  50 mg Oral Daily [name], DO   50 mg at 01/05/23 1611   magnesium hydroxide (MILK OF MAGNESIA) suspension 30 mL  30 mL Oral Daily PRN [name], [name]       metoprolol succinate (TOPROL-XL) 24 hr tablet 50 mg  50 mg Oral Daily [name], DO   50 mg at 01/05/23 1609   mirtazapine (REMERON) tablet 15 mg  15 mg Oral QHS [name], [name]   15 mg at 01/04/23 2055   multivitamin with minerals tablet 1 tablet  1  tablet Oral Daily [name], [name]   1 tablet at 01/05/23 1612   thiamine (VITAMIN B1) tablet 100 mg  100 mg  Oral Daily [name], [name]   100 mg at 01/05/23 1610   traZODone (DESYREL) tablet 150 mg  150 mg Oral QHS PRN [name], DO   150 mg at 01/04/23 2054    Lab Results: No results found for this or any previous visit (from the past 48 hour(s)).  Blood Alcohol level:  Lab Results  Component Value Date   ETH <10 12/19/2022   ETH <10 [id number]    Metabolic Disorder Labs: Lab Results  Component Value Date   HGBA1C 5.8 (H) 12/31/2022   MPG 120 12/31/2022   MPG 111 04/02/2021   No results found for: "PROLACTIN" Lab Results  Component Value Date   CHOL 149 12/31/2022   TRIG 160 (H) 12/31/2022   HDL 26 (L) 12/31/2022   CHOLHDL 5.7 12/31/2022   VLDL 32 12/31/2022   LDLCALC 91 12/31/2022   LDLCALC 122 (H) 05/04/2021    Physical Findings: AIMS: Facial and Oral Movements Muscles of Facial Expression: None, normal Lips and Perioral Area: None, normal Jaw: None, normal Tongue: None, normal,Extremity Movements Upper (arms, wrists, hands, fingers): None, normal Lower (legs, knees, ankles, toes): None, normal, Trunk Movements Neck, shoulders, hips: None, normal, Overall Severity Severity of abnormal movements (highest score from questions above): None, normal Incapacitation due to abnormal movements: None, normal Patient's awareness of abnormal movements (rate only patient's report): No Awareness, Dental Status Current problems with teeth and/or dentures?: No Does patient usually wear dentures?: No  CIWA:    COWS:     Musculoskeletal: Strength & Muscle Tone: within normal limits Gait & Station: normal Patient leans: N/A  Psychiatric Specialty Exam:  Presentation  General Appearance:  Appropriate for Environment  Eye Contact: Good  Speech: Clear and Coherent  Speech Volume: Normal  Handedness: -- (unknown)   Mood and Affect   Mood: Depressed  Affect: Congruent   Thought Process  Thought Processes: Coherent; Goal Directed; Linear  Descriptions of Associations:Intact  Orientation:Full (Time, Place and Person)  Thought Content:Logical  History of Schizophrenia/Schizoaffective disorder:No data recorded Duration of Psychotic Symptoms:No data recorded Hallucinations:No data recorded Ideas of Reference:None  Suicidal Thoughts:No data recorded Homicidal Thoughts:No data recorded  Sensorium  Memory: Immediate Fair; Recent Fair  Judgment: Fair  Insight: Good   Executive Functions  Concentration: Good  Attention Span: Good  Recall: Good  Fund of Knowledge: Good  Language: Good   Psychomotor Activity  Psychomotor Activity:No data recorded  Assets  Assets: Communication Skills; Leisure Time; Resilience   Sleep  Sleep:No data recorded   Physical Exam: Physical Exam Vitals reviewed.  Constitutional:      Appearance: Normal appearance.  HENT:     Head: Normocephalic and atraumatic.     Mouth/Throat:     Pharynx: Oropharynx is clear.  Eyes:     Pupils: Pupils are equal, round, and reactive to light.  Cardiovascular:     Rate and Rhythm: Normal rate and regular rhythm.  Pulmonary:     Effort: Pulmonary effort is normal.     Breath sounds: Normal breath sounds.  Abdominal:     General: Abdomen is flat.     Palpations: Abdomen is soft.  Musculoskeletal:        General: Normal range of motion.  Skin:    General: Skin is warm and dry.  Neurological:     General: No focal deficit present.     Mental Status: He is alert. Mental status is at baseline.  Psychiatric:        Mood and  Affect: Mood normal.        Thought Content: Thought content normal.    Review of Systems  Constitutional: Negative.   HENT: Negative.    Eyes: Negative.   Respiratory: Negative.    Cardiovascular: Negative.   Gastrointestinal: Negative.   Musculoskeletal: Negative.   Skin:  Negative.   Neurological: Negative.   Psychiatric/Behavioral: Negative.     Blood pressure 107/71, pulse 96, temperature 98.3 F (36.8 C), temperature source Oral, resp. rate 18, height 5\' 10"  (1.778 m), weight 97.5 kg, SpO2 100 %. Body mass index is 30.85 kg/m.   Treatment Plan Summary: Plan continue current medication.  Supportive counseling.  Reviewed with patient options that he may have and the plan to talk with social work.  Alethia Berthold, MD 01/05/2023, 4:55 PM

## 2023-01-05 NOTE — Progress Notes (Signed)
Patient finally took scheduled morning medication.

## 2023-01-05 NOTE — Plan of Care (Signed)
  Problem: Pain Managment: Goal: General experience of comfort will improve Outcome: Progressing   Problem: Safety: Goal: Ability to remain free from injury will improve Outcome: Progressing   Problem: Skin Integrity: Goal: Risk for impaired skin integrity will decrease Outcome: Progressing  Patient isolative to his room denies SI/HI/A/VH and verbally contracted for safety. Support and encouragement provided.

## 2023-01-05 NOTE — [facility] Counselor (Signed)
CSW followed up with patient on [employer].  Patient stated that he has not called due to knowing that he does not have the expected deposit.  He reports a belief that deposit is $300.  CSW asked if patient could speak with Director of [employer] to see if fee can be waived until patient receives his check.  Patient requested that staff speak with director due to belief that director will be more receptive to staff.  CSW team to follow up.  Assunta Curtis, MSW, LCSW 01/05/2023 4:37 PM

## 2023-01-06 NOTE — Plan of Care (Signed)
  Problem: Education: Goal: Knowledge of General Education information will improve Description: Including pain rating scale, medication(s)/side effects and non-pharmacologic comfort measures Outcome: Progressing   Problem: Clinical Measurements: Goal: Cardiovascular complication will be avoided Outcome: Progressing   Problem: Nutrition: Goal: Adequate nutrition will be maintained Outcome: Progressing   Problem: Coping: Goal: Level of anxiety will decrease Outcome: Progressing   Problem: Activity: Goal: Risk for activity intolerance will decrease Outcome: Not Progressing

## 2023-01-06 NOTE — Group Note (Signed)
[facility] LCSW Group Therapy Note   Group Date: 01/06/2023 Start Time: [id number] End Time: 1411   Type of Therapy/Topic:  Group Therapy:  Emotion Regulation  Participation Level:  Did Not Attend   Mood:  Description of Group:    The purpose of this group is to assist patients in learning to regulate negative emotions and experience positive emotions. Patients will be guided to discuss ways in which they have been vulnerable to their negative emotions. These vulnerabilities will be juxtaposed with experiences of positive emotions or situations, and patients challenged to use positive emotions to combat negative ones. Special emphasis will be placed on coping with negative emotions in conflict situations, and patients will process healthy conflict resolution skills.  Therapeutic Goals: Patient will identify two positive emotions or experiences to reflect on in order to balance out negative emotions:  Patient will label two or more emotions that they find the most difficult to experience:  Patient will be able to demonstrate positive conflict resolution skills through discussion or role plays:   Summary of Patient Progress:   X    Therapeutic Modalities:   Cognitive Behavioral Therapy Feelings Identification Dialectical Behavioral Therapy   Rozann Lesches, LCSW

## 2023-01-06 NOTE — Progress Notes (Signed)
[facility] MD Progress Note  01/06/2023 2:24 PM Cory Floyd  MRN:  [id number] Subjective: Patient has no new complaints.  Mood stable.  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   Anginal pain ([place])    Arthritis    Bipolar 1 disorder ([place])    COVID-19    Depressed    Diabetes mellitus without complication ([place])    Dyspnea    Fatty liver    Hypertension    Schizophrenia ([place])     Past Surgical History:  Procedure Laterality Date   COLONOSCOPY  02/05/2014   diverticulosis, hyperplastic polyp x 2   fracture arm Right    INCISION AND DRAINAGE ABSCESS Bilateral 12/29/2022   Procedure: INCISION AND DRAINAGE ABSCESS BILATERAL ARMS;  Surgeon: Shona Needles, MD;  Location: [place];  Service: Orthopedics;  Laterality: Bilateral;   KNEE SURGERY Right 2013   NASAL SEPTUM SURGERY     NASAL TURBINATE REDUCTION Bilateral 12/12/2016   Procedure: TURBINATE REDUCTION/SUBMUCOSAL RESECTION;  Surgeon: Beverly Gust, MD;  Location: [facility];  Service: ENT;  Laterality: Bilateral;   Family History:  Family History  Problem Relation Age of Onset   Hepatitis C Mother    Cancer Mother    Cancer Maternal Aunt    Hypertension Maternal Grandmother    Family Psychiatric  History: See previous Social History:  Social History   Substance and Sexual Activity  Alcohol Use Yes   Comment: 1/2-5th pint of liquor     Social History   Substance and Sexual Activity  Drug  Use Yes   Types: Cocaine, Marijuana   Comment: heroin    Social History   Socioeconomic History   Marital status: Divorced    Spouse name: Not on file   Number of children: Not on file   Years of education: Not on file   Highest education level: Not on file  Occupational History   Not on file  Tobacco Use   Smoking status: Every Day    Packs/day: 1.00    Years: 20.00    Additional pack years: 0.00    Total pack years: 20.00    Types: Cigarettes   Smokeless tobacco: Never  Vaping Use   Vaping Use: Never used  Substance and Sexual Activity   Alcohol use: Yes    Comment: 1/2-5th pint of liquor   Drug use: Yes    Types: Cocaine, Marijuana    Comment: heroin   Sexual activity: Yes    Birth control/protection: None  Other Topics Concern   Not on file  Social History Narrative   Not on file   Social Determinants of Health   Financial Resource Strain: Not on file  Food Insecurity: Food Insecurity Present (12/30/2022)   Hunger Vital Sign    Worried About Running Out of Food in the Last Year: Often true    Ran Out of Food in the Last Year: Often true  Transportation Needs: Unmet Transportation Needs (12/30/2022)   PRAPARE -  Hydrologist (Medical): Yes    Lack of Transportation (Non-Medical): Yes  Physical Activity: Not on file  Stress: Not on file  Social Connections: Not on file   Additional Social History:                         Sleep: Fair  Appetite:  Fair  Current Medications: Current Facility-Administered Medications  Medication Dose Route Frequency Provider Last Rate Last Admin   acetaminophen (TYLENOL) tablet 650 mg  650 mg Oral Q6H PRN [name], [name]   650 mg at 01/04/23 0931   alum & mag hydroxide-simeth (MAALOX/MYLANTA) 200-200-20 MG/5ML suspension 30 mL  30 mL Oral Q4H PRN [name], [name]       ARIPiprazole (ABILIFY) tablet 10 mg  10 mg Oral Daily [name], [name]   10 mg at 01/06/23 [id number]    cloNIDine (CATAPRES) tablet 0.2 mg  0.2 mg Oral Q6H PRN [name], DO       diphenhydrAMINE (BENADRYL) capsule 50 mg  50 mg Oral TID PRN [name], [name]       Or   diphenhydrAMINE (BENADRYL) injection 50 mg  50 mg Intramuscular TID PRN [name], [name]       divalproex (DEPAKOTE ER) 24 hr tablet 1,000 mg  1,000 mg Oral Daily [name], [name]   1,000 mg at 01/06/23 [id number]   doxycycline (VIBRA-TABS) tablet 100 mg  100 mg Oral Q12H Amareon Phung T, MD   100 mg at 01/06/23 0828   fluticasone (FLONASE) 50 MCG/ACT nasal spray 1 spray  1 spray Each Nare Daily [name], [name]   1 spray at [id number]   folic acid (FOLVITE) tablet 1 mg  1 mg Oral Daily [name], [name]   1 mg at 01/06/23 [id number]   haloperidol (HALDOL) tablet 5 mg  5 mg Oral TID PRN [name], [name]       Or   haloperidol lactate (HALDOL) injection 5 mg  5 mg Intramuscular TID PRN [name], [name]       ibuprofen (ADVIL) tablet 600 mg  600 mg Oral Q6H PRN Limuel Nieblas T, MD       ipratropium (ATROVENT) nebulizer solution 0.5 mg  0.5 mg Nebulization BID PRN Brinlee Gambrell, [name], MD       LORazepam (ATIVAN) tablet 2 mg  2 mg Oral TID PRN [name], [name]   2 mg at 01/01/23 2104   Or   LORazepam (ATIVAN) injection 2 mg  2 mg Intramuscular TID PRN [name], [name]       losartan (COZAAR) tablet 50 mg  50 mg Oral Daily [name], DO   50 mg at 01/06/23 [id number]   magnesium hydroxide (MILK OF MAGNESIA) suspension 30 mL  30 mL Oral Daily PRN [name], [name]       metoprolol succinate (TOPROL-XL) 24 hr tablet 50 mg  50 mg Oral Daily [name], DO   50 mg at 01/05/23 1609   mirtazapine (REMERON) tablet 15 mg  15 mg Oral QHS [name], [name]   15 mg at 01/04/23 2055   multivitamin with minerals tablet 1 tablet  1 tablet Oral Daily [name], [name]   1 tablet at 01/06/23 [id number]   thiamine (VITAMIN B1) tablet 100 mg  100 mg Oral Daily [name],  [name]   100 mg at 01/06/23 0828   traZODone (DESYREL) tablet 150 mg  150  mg Oral QHS PRN [name], DO   150 mg at 01/04/23 2054    Lab Results: No results found for this or any previous visit (from the past 48 hour(s)).  Blood Alcohol level:  Lab Results  Component Value Date   ETH <10 12/19/2022   ETH <10 [id number]    Metabolic Disorder Labs: Lab Results  Component Value Date   HGBA1C 5.8 (H) 12/31/2022   MPG 120 12/31/2022   MPG 111 04/02/2021   No results found for: "PROLACTIN" Lab Results  Component Value Date   CHOL 149 12/31/2022   TRIG 160 (H) 12/31/2022   HDL 26 (L) 12/31/2022   CHOLHDL 5.7 12/31/2022   VLDL 32 12/31/2022   LDLCALC 91 12/31/2022   LDLCALC 122 (H) 05/04/2021    Physical Findings: AIMS: Facial and Oral Movements Muscles of Facial Expression: None, normal Lips and Perioral Area: None, normal Jaw: None, normal Tongue: None, normal,Extremity Movements Upper (arms, wrists, hands, fingers): None, normal Lower (legs, knees, ankles, toes): None, normal, Trunk Movements Neck, shoulders, hips: None, normal, Overall Severity Severity of abnormal movements (highest score from questions above): None, normal Incapacitation due to abnormal movements: None, normal Patient's awareness of abnormal movements (rate only patient's report): No Awareness, Dental Status Current problems with teeth and/or dentures?: No Does patient usually wear dentures?: No  CIWA:    COWS:     Musculoskeletal: Strength & Muscle Tone: within normal limits Gait & Station: normal Patient leans: N/A  Psychiatric Specialty Exam:  Presentation  General Appearance:  Appropriate for Environment  Eye Contact: Good  Speech: Clear and Coherent  Speech Volume: Normal  Handedness: -- (unknown)   Mood and Affect  Mood: Depressed  Affect: Congruent   Thought Process  Thought Processes: Coherent; Goal Directed; Linear  Descriptions of  Associations:Intact  Orientation:Full (Time, Place and Person)  Thought Content:Logical  History of Schizophrenia/Schizoaffective disorder:No data recorded Duration of Psychotic Symptoms:No data recorded Hallucinations:No data recorded Ideas of Reference:None  Suicidal Thoughts:No data recorded Homicidal Thoughts:No data recorded  Sensorium  Memory: Immediate Fair; Recent Fair  Judgment: Fair  Insight: Good   Executive Functions  Concentration: Good  Attention Span: Good  Recall: Good  Fund of Knowledge: Good  Language: Good   Psychomotor Activity  Psychomotor Activity:No data recorded  Assets  Assets: Communication Skills; Leisure Time; Resilience   Sleep  Sleep:No data recorded   Physical Exam: Physical Exam Vitals and nursing note reviewed.  Constitutional:      Appearance: Normal appearance.  HENT:     Head: Normocephalic and atraumatic.     Mouth/Throat:     Pharynx: Oropharynx is clear.  Eyes:     Pupils: Pupils are equal, round, and reactive to light.  Cardiovascular:     Rate and Rhythm: Normal rate and regular rhythm.  Pulmonary:     Effort: Pulmonary effort is normal.     Breath sounds: Normal breath sounds.  Abdominal:     General: Abdomen is flat.     Palpations: Abdomen is soft.  Musculoskeletal:        General: Normal range of motion.  Skin:    General: Skin is warm and dry.  Neurological:     General: No focal deficit present.     Mental Status: He is alert. Mental status is at baseline.  Psychiatric:        Attention and Perception: Attention normal.        Mood and Affect: Mood normal.  Speech: Speech normal.        Behavior: Behavior normal.        Thought Content: Thought content normal.        Cognition and Memory: Cognition normal.        Judgment: Judgment normal.    Review of Systems  Constitutional: Negative.   HENT: Negative.    Eyes: Negative.   Respiratory: Negative.    Cardiovascular:  Negative.   Gastrointestinal: Negative.   Musculoskeletal: Negative.   Skin: Negative.   Neurological: Negative.   Psychiatric/Behavioral: Negative.     Blood pressure 99/72, pulse 80, temperature 97.8 F (36.6 C), temperature source Oral, resp. rate 19, height 5\' 10"  (1.778 m), weight 97.5 kg, SpO2 100 %. Body mass index is 30.85 kg/m.   Treatment Plan Summary: Plan no change to treatment plan continue current medicine.  Patient is encouraged to be involved on the unit.  Vital stable.  Working on possible placement issues when he has some money.  Alethia Berthold, MD 01/06/2023, 2:24 PM

## 2023-01-06 NOTE — BH IP Treatment Plan (Signed)
Interdisciplinary Treatment and Diagnostic Plan Update  01/06/2023 Time of Session: 8:30AM Cory Floyd MRN: [id number]  Principal Diagnosis: Bipolar 1 disorder ([place])  Secondary Diagnoses: Principal Problem:   Bipolar 1 disorder ([place]) Active Problems:   Gastroesophageal reflux disease without esophagitis   Cocaine abuse with cocaine-induced mood disorder (HCC)   Amphetamine abuse ([place])   Cellulitis   Rupture of operation wound   Current Medications:  Current Facility-Administered Medications  Medication Dose Route Frequency Provider Last Rate Last Admin   acetaminophen (TYLENOL) tablet 650 mg  650 mg Oral Q6H PRN [name], [name]   650 mg at 01/04/23 0931   alum & mag hydroxide-simeth (MAALOX/MYLANTA) 200-200-20 MG/5ML suspension 30 mL  30 mL Oral Q4H PRN [name], [name]       ARIPiprazole (ABILIFY) tablet 10 mg  10 mg Oral Daily [name], [name]   10 mg at 01/06/23 [id number]   cloNIDine (CATAPRES) tablet 0.2 mg  0.2 mg Oral Q6H PRN [name], DO       diphenhydrAMINE (BENADRYL) capsule 50 mg  50 mg Oral TID PRN [name], [name]       Or   diphenhydrAMINE (BENADRYL) injection 50 mg  50 mg Intramuscular TID PRN [name], [name]       divalproex (DEPAKOTE ER) 24 hr tablet 1,000 mg  1,000 mg Oral Daily [name], [name]   1,000 mg at 01/06/23 [id number]   doxycycline (VIBRA-TABS) tablet 100 mg  100 mg Oral Q12H Clapacs, [name], MD   100 mg at 01/06/23 0828   fluticasone (FLONASE) 50 MCG/ACT nasal spray 1 spray  1 spray Each Nare Daily [name], [name]   1 spray at [id number]   folic acid (FOLVITE) tablet 1 mg  1 mg Oral Daily [name], [name]   1 mg at 01/06/23 [id number]   haloperidol (HALDOL) tablet 5 mg  5 mg Oral TID PRN [name], [name]       Or   haloperidol lactate (HALDOL) injection 5 mg  5 mg Intramuscular TID PRN [name], [name]       ibuprofen (ADVIL) tablet 600 mg  600 mg Oral Q6H PRN Clapacs, [name], MD        ipratropium (ATROVENT) nebulizer solution 0.5 mg  0.5 mg Nebulization BID PRN Clapacs, [name], MD       LORazepam (ATIVAN) tablet 2 mg  2 mg Oral TID PRN [name], [name]   2 mg at 01/01/23 2104   Or   LORazepam (ATIVAN) injection 2 mg  2 mg Intramuscular TID PRN [name], [name]       losartan (COZAAR) tablet 50 mg  50 mg Oral Daily [name], DO   50 mg at 01/06/23 [id number]   magnesium hydroxide (MILK OF MAGNESIA) suspension 30 mL  30 mL Oral Daily PRN [name], [name]       metoprolol succinate (TOPROL-XL) 24 hr tablet 50 mg  50 mg Oral Daily [name], DO   50 mg at 01/05/23 1609   mirtazapine (REMERON) tablet 15 mg  15 mg Oral QHS [name], [name]   15 mg at 01/04/23 2055   multivitamin with minerals tablet 1 tablet  1 tablet Oral Daily [name], [name]   1 tablet at 01/06/23 [id number]   thiamine (VITAMIN B1) tablet 100 mg  100 mg Oral Daily [name], [name]   100 mg at 01/06/23 0828   traZODone (DESYREL) tablet 150 mg  150 mg  Oral QHS PRN [name], DO   150 mg at 01/04/23 2054   PTA Medications: Medications Prior to Admission  Medication Sig Dispense Refill Last Dose   ARIPiprazole (ABILIFY) 10 MG tablet Take 1 tablet (10 mg total) by mouth daily. (Patient not taking: Reported on 12/27/2022) 30 tablet 0 Not Taking   benzonatate (TESSALON) 100 MG capsule Take 1 capsule (100 mg total) by mouth 3 (three) times daily as needed for cough. (Patient not taking: Reported on 12/31/2022) 20 capsule 0 Not Taking   divalproex (DEPAKOTE ER) 500 MG 24 hr tablet Take 2 tablets (1,000 mg total) by mouth at bedtime. (Patient not taking: Reported on 12/27/2022) 60 tablet 0 Not Taking   ipratropium (ATROVENT HFA) 17 MCG/ACT inhaler Inhale 2 puffs into the lungs every 6 (six) hours as needed for wheezing. (Patient not taking: Reported on 12/31/2022) 1 each 12 Not Taking   mirtazapine (REMERON) 15 MG tablet Take 1 tablet (15 mg total) by mouth at  bedtime. For depression/sleep (Patient not taking: Reported on 12/27/2022) 30 tablet 0 Not Taking    Patient Stressors: Financial difficulties   Health problems   Legal issue   Medication change or noncompliance   Substance abuse    Patient Strengths: Ability for insight  Average or above average intelligence  Communication skills   Treatment Modalities: Medication Management, Group therapy, Case management,  1 to 1 session with clinician, Psychoeducation, Recreational therapy.   Physician Treatment Plan for Primary Diagnosis: Bipolar 1 disorder ([place]) Long Term Goal(s): Improvement in symptoms so as ready for discharge   Short Term Goals: Ability to identify changes in lifestyle to reduce recurrence of condition will improve Ability to verbalize feelings will improve Ability to disclose and discuss suicidal ideas Ability to demonstrate self-control will improve Ability to identify and develop effective coping behaviors will improve Ability to maintain clinical measurements within normal limits will improve Compliance with prescribed medications will improve Ability to identify triggers associated with substance abuse/mental health issues will improve  Medication Management: Evaluate patient's response, side effects, and tolerance of medication regimen.  Therapeutic Interventions: 1 to 1 sessions, Unit Group sessions and Medication administration.  Evaluation of Outcomes: Progressing  Physician Treatment Plan for Secondary Diagnosis: Principal Problem:   Bipolar 1 disorder ([place]) Active Problems:   Gastroesophageal reflux disease without esophagitis   Cocaine abuse with cocaine-induced mood disorder (HCC)   Amphetamine abuse (HCC)   Cellulitis   Rupture of operation wound  Long Term Goal(s): Improvement in symptoms so as ready for discharge   Short Term Goals: Ability to identify changes in lifestyle to reduce recurrence of condition will improve Ability to verbalize  feelings will improve Ability to disclose and discuss suicidal ideas Ability to demonstrate self-control will improve Ability to identify and develop effective coping behaviors will improve Ability to maintain clinical measurements within normal limits will improve Compliance with prescribed medications will improve Ability to identify triggers associated with substance abuse/mental health issues will improve     Medication Management: Evaluate patient's response, side effects, and tolerance of medication regimen.  Therapeutic Interventions: 1 to 1 sessions, Unit Group sessions and Medication administration.  Evaluation of Outcomes: Progressing   RN Treatment Plan for Primary Diagnosis: Bipolar 1 disorder ([place]) Long Term Goal(s): Knowledge of disease and therapeutic regimen to maintain health will improve  Short Term Goals: Ability to demonstrate self-control, Ability to participate in decision making will improve, Ability to verbalize feelings will improve, Ability to disclose and discuss suicidal ideas,  Ability to identify and develop effective coping behaviors will improve, and Compliance with prescribed medications will improve  Medication Management: RN will administer medications as ordered by provider, will assess and evaluate patient's response and provide education to patient for prescribed medication. RN will report any adverse and/or side effects to prescribing provider.  Therapeutic Interventions: 1 on 1 counseling sessions, Psychoeducation, Medication administration, Evaluate responses to treatment, Monitor vital signs and CBGs as ordered, Perform/monitor CIWA, COWS, AIMS and Fall Risk screenings as ordered, Perform wound care treatments as ordered.  Evaluation of Outcomes: Progressing   LCSW Treatment Plan for Primary Diagnosis: Bipolar 1 disorder ([place]) Long Term Goal(s): Safe transition to appropriate next level of care at discharge, Engage patient in therapeutic group  addressing interpersonal concerns.  Short Term Goals: Engage patient in aftercare planning with referrals and resources, Increase social support, Increase ability to appropriately verbalize feelings, Increase emotional regulation, Facilitate acceptance of mental health diagnosis and concerns, Facilitate patient progression through stages of change regarding substance use diagnoses and concerns, Identify triggers associated with mental health/substance abuse issues, and Increase skills for wellness and recovery  Therapeutic Interventions: Assess for all discharge needs, 1 to 1 time with Social worker, Explore available resources and support systems, Assess for adequacy in community support network, Educate family and significant other(s) on suicide prevention, Complete Psychosocial Assessment, Interpersonal group therapy.  Evaluation of Outcomes: Progressing   Progress in Treatment: Attending groups: No. Participating in groups: No. Taking medication as prescribed: Yes. Toleration medication: Yes. Family/Significant other contact made: No, will contact:  once permission is given.  Patient understands diagnosis: Yes. Discussing patient identified problems/goals with staff: Yes. Medical problems stabilized or resolved: Yes. Denies suicidal/homicidal ideation: Yes. Issues/concerns per patient self-inventory: No. Other: none  New problem(s) identified: No, Describe:  none identified. Update 01/06/2023:  No changes at this time.    New Short Term/Long Term Goal(s): detox, elimination of symptoms of psychosis, medication management for mood stabilization; elimination of SI thoughts; development of comprehensive mental wellness/sobriety plan. Update 01/06/2023:  No changes at this time.   Patient Goals:  "Housing, housing, and more housing." Update 01/06/2023:  No changes at this time.   Discharge Plan or Barriers: CSW will assist pt with development of an appropriate aftercare/discharge plan.  Update 01/06/2023:  Patient remains on the unit at this time looking for placement.  Patient has been given information to contact and speak with [facility]  Patient reports that he is open to this program, however, he does not have the funding for the initial admission fee, $300.  Patient was advised to speak with Director of [facility], however, patient has declined to do so and requested that staff do so.  Follow up is needed.     Reason for Continuation of Hospitalization: Anxiety Medical Issues Medication stabilization Suicidal ideation Withdrawal symptoms   Estimated Length of Stay: 1-7 days Update 01/06/2023:  TBD  Last 3 [place] Suicide Severity Risk Score: Flowsheet Row Admission (Current) from 12/30/2022 in [place] ED to Hosp-Admission (Discharged) from 12/26/2022 in [facility] ED from 12/19/2022 in [facility] at [place] No Risk No Risk No Risk       Last PHQ 2/9 Scores:     No data to display          Scribe for Treatment Team: Rozann Lesches, LCSW 01/06/2023 11:26 AM

## 2023-01-06 NOTE — Group Note (Signed)
AA/NA Group     Group Date: 01/06/2023 Start Time: [id number] End Time: 1445     Type of Therapy and Topic:  Group Therapy:    Participation Level:  Did Not Attend   Description of Group: AA/NA providers held group with patients to address SUD.   Summary of Patient Progress:     X   Therapeutic Modalities:    Maryjane Hurter 01/06/2023  2:53 PM

## 2023-01-06 NOTE — Progress Notes (Signed)
Pt denies SI/HI/AVH and verbally agrees to approach staff if these become apparent or before harming themselves/others. Rates depression 0/10. Rates anxiety 0/10. Rates pain 0/10.  Pt has been in his room for the entire morning. Pt came out to eat lunch. Pt walked around for a few times and went outside. Pt dressing changed at [id number] with dry sterile non-adherent guaze. Pt did not want one at first due to tape pulling hair, RN encouraged to have it dressed but avoided tape on hair portions. Scheduled medications administered to pt, per MD orders. RN provided support and encouragement to pt. Q15 min safety checks implemented and continued. Pt is safe on the unit. Plan of care on going and no other concerns expressed at this time.  01/06/23 0828  Psych Admission Type (Psych Patients Only)  Admission Status Voluntary  Psychosocial Assessment  Patient Complaints None  Eye Contact Brief  Facial Expression Animated  Affect Appropriate to circumstance  Speech Logical/coherent  Interaction Isolative  Motor Activity Slow  Appearance/Hygiene Unremarkable  Behavior Characteristics Cooperative;Appropriate to situation;Calm  Mood Pleasant  Aggressive Behavior  Effect No apparent injury  Thought Process  Coherency WDL  Content WDL  Delusions None reported or observed  Perception WDL  Hallucination None reported or observed  Judgment Impaired  Confusion None  Danger to Self  Current suicidal ideation? Denies  Danger to Others  Danger to Others None reported or observed

## 2023-01-06 NOTE — Progress Notes (Signed)
Pt refused his scheduled evening medication stating it was for cravings.  Pt said he would need Trazodone for sleep but ultimately did not request it.  Denied SI HI AVH.  Continued monitoring for safety.    01/06/23 0200  Psych Admission Type (Psych Patients Only)  Admission Status Voluntary  Psychosocial Assessment  Patient Complaints Depression  Eye Contact Fair  Facial Expression Animated  Affect Appropriate to circumstance  Speech Logical/coherent  Interaction Assertive  Motor Activity Slow  Appearance/Hygiene Unremarkable  Behavior Characteristics Cooperative  Mood Pleasant  Thought Process  Coherency WDL  Content WDL  Delusions None reported or observed  Perception WDL  Hallucination None reported or observed  Judgment WDL  Confusion None  Danger to Self  Current suicidal ideation? Denies  Danger to Others  Danger to Others None reported or observed

## 2023-01-07 NOTE — Progress Notes (Signed)
Pt was isolative and resistant to care.  Pt did agree to take antibiotic but refused Remeron.  Pt rarely got out of bed majority of shift.  Activity and participation were encouraged.    01/07/23 0000  Psych Admission Type (Psych Patients Only)  Admission Status Voluntary  Psychosocial Assessment  Patient Complaints None  Eye Contact Brief  Facial Expression Flat  Affect Appropriate to circumstance  Speech Logical/coherent  Interaction Isolative  Motor Activity Slow  Appearance/Hygiene Unremarkable  Behavior Characteristics Cooperative  Mood Pleasant  Thought Process  Coherency WDL  Content WDL  Delusions None reported or observed  Perception WDL  Hallucination None reported or observed  Judgment Impaired  Confusion None  Danger to Self  Current suicidal ideation? Denies  Danger to Others  Danger to Others None reported or observed

## 2023-01-07 NOTE — Plan of Care (Signed)
Patient pleasant and cooperative on approach. Appropriate with staff and peers. Denies SI,HI and AVH. No issues verbalized. Compliant with medications. Appetite and energy level good. ADLs maintained. Support and encouragement given.

## 2023-01-07 NOTE — Progress Notes (Signed)
[facility] MD Progress Note  01/07/2023 1:02 PM Cory Floyd  MRN:  [id number] Subjective: Follow-up patient with bipolar disorder and substance abuse.  No new complaints.  States that mood is feeling good.  Denies suicidal thoughts denies hallucinations. 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   Anginal pain ([place])    Arthritis    Bipolar 1 disorder ([place])    COVID-19    Depressed    Diabetes mellitus without complication ([place])    Dyspnea    Fatty liver    Hypertension    Schizophrenia ([place])     Past Surgical History:  Procedure Laterality Date   COLONOSCOPY  02/05/2014   diverticulosis, hyperplastic polyp x 2   fracture arm Right    INCISION AND DRAINAGE ABSCESS Bilateral 12/29/2022   Procedure: INCISION AND DRAINAGE ABSCESS BILATERAL ARMS;  Surgeon: Shona Needles, MD;  Location: [place];  Service: Orthopedics;  Laterality: Bilateral;   KNEE SURGERY Right 2013   NASAL SEPTUM SURGERY     NASAL TURBINATE REDUCTION Bilateral 12/12/2016   Procedure: TURBINATE REDUCTION/SUBMUCOSAL RESECTION;  Surgeon: Beverly Gust, MD;  Location: [facility];  Service: ENT;  Laterality: Bilateral;   Family History:  Family History  Problem Relation Age of Onset   Hepatitis C Mother    Cancer Mother    Cancer Maternal Aunt    Hypertension Maternal Grandmother    Family Psychiatric  History: See previous Social History:  Social History   Substance and Sexual Activity  Alcohol Use Yes   Comment: 1/2-5th pint of  liquor     Social History   Substance and Sexual Activity  Drug Use Yes   Types: Cocaine, Marijuana   Comment: heroin    Social History   Socioeconomic History   Marital status: Divorced    Spouse name: Not on file   Number of children: Not on file   Years of education: Not on file   Highest education level: Not on file  Occupational History   Not on file  Tobacco Use   Smoking status: Every Day    Packs/day: 1.00    Years: 20.00    Additional pack years: 0.00    Total pack years: 20.00    Types: Cigarettes   Smokeless tobacco: Never  Vaping Use   Vaping Use: Never used  Substance and Sexual Activity   Alcohol use: Yes    Comment: 1/2-5th pint of liquor   Drug use: Yes    Types: Cocaine, Marijuana    Comment: heroin   Sexual activity: Yes    Birth control/protection: None  Other Topics Concern   Not on file  Social History Narrative   Not on file   Social Determinants of Health   Financial Resource Strain: Not on file  Food Insecurity: Food Insecurity Present (12/30/2022)   Hunger Vital Sign    Worried About Running Out of Food in the Last Year: Often true    Ran Out of Food in the Last Year: Often true  Transportation  Needs: Unmet Transportation Needs (12/30/2022)   PRAPARE - Hydrologist (Medical): Yes    Lack of Transportation (Non-Medical): Yes  Physical Activity: Not on file  Stress: Not on file  Social Connections: Not on file   Additional Social History:                         Sleep: Fair  Appetite:  Fair  Current Medications: Current Facility-Administered Medications  Medication Dose Route Frequency Provider Last Rate Last Admin   acetaminophen (TYLENOL) tablet 650 mg  650 mg Oral Q6H PRN [name], [name]   650 mg at 01/04/23 0931   alum & mag hydroxide-simeth (MAALOX/MYLANTA) 200-200-20 MG/5ML suspension 30 mL  30 mL Oral Q4H PRN [name], [name]       ARIPiprazole (ABILIFY) tablet 10 mg  10  mg Oral Daily [name], [name]   10 mg at 01/07/23 [id number]   cloNIDine (CATAPRES) tablet 0.2 mg  0.2 mg Oral Q6H PRN [name], DO       diphenhydrAMINE (BENADRYL) capsule 50 mg  50 mg Oral TID PRN [name], [name]       Or   diphenhydrAMINE (BENADRYL) injection 50 mg  50 mg Intramuscular TID PRN [name], [name]       divalproex (DEPAKOTE ER) 24 hr tablet 1,000 mg  1,000 mg Oral Daily [name], [name]   1,000 mg at 01/07/23 0817   doxycycline (VIBRA-TABS) tablet 100 mg  100 mg Oral Q12H Lanise Mergen T, MD   100 mg at 01/07/23 0818   fluticasone (FLONASE) 50 MCG/ACT nasal spray 1 spray  1 spray Each Nare Daily [name], [name]   1 spray at 0000000 [id number]   folic acid (FOLVITE) tablet 1 mg  1 mg Oral Daily [name], [name]   1 mg at 01/07/23 [id number]   haloperidol (HALDOL) tablet 5 mg  5 mg Oral TID PRN [name], [name]       Or   haloperidol lactate (HALDOL) injection 5 mg  5 mg Intramuscular TID PRN [name], [name]       ibuprofen (ADVIL) tablet 600 mg  600 mg Oral Q6H PRN Klare Criss T, MD       ipratropium (ATROVENT) nebulizer solution 0.5 mg  0.5 mg Nebulization BID PRN Gregory Barrick, [name], MD       LORazepam (ATIVAN) tablet 2 mg  2 mg Oral TID PRN [name], [name]   2 mg at 01/01/23 2104   Or   LORazepam (ATIVAN) injection 2 mg  2 mg Intramuscular TID PRN [name], [name]       losartan (COZAAR) tablet 50 mg  50 mg Oral Daily [name], DO   50 mg at 01/07/23 0818   magnesium hydroxide (MILK OF MAGNESIA) suspension 30 mL  30 mL Oral Daily PRN [name], [name]       metoprolol succinate (TOPROL-XL) 24 hr tablet 50 mg  50 mg Oral Daily [name], DO   50 mg at 01/07/23 0817   mirtazapine (REMERON) tablet 15 mg  15 mg Oral QHS [name], [name]   15 mg at 01/04/23 2055   multivitamin with minerals tablet 1 tablet  1 tablet Oral Daily [name], [name]   1 tablet at 01/07/23 [id number]    thiamine (VITAMIN B1) tablet 100 mg  100 mg Oral Daily [name], [name]   100 mg at 01/07/23 [id number]  traZODone (DESYREL) tablet 150 mg  150 mg Oral QHS PRN [name], DO   150 mg at 01/04/23 2054    Lab Results: No results found for this or any previous visit (from the past 48 hour(s)).  Blood Alcohol level:  Lab Results  Component Value Date   ETH <10 12/19/2022   ETH <10 [id number]    Metabolic Disorder Labs: Lab Results  Component Value Date   HGBA1C 5.8 (H) 12/31/2022   MPG 120 12/31/2022   MPG 111 04/02/2021   No results found for: "PROLACTIN" Lab Results  Component Value Date   CHOL 149 12/31/2022   TRIG 160 (H) 12/31/2022   HDL 26 (L) 12/31/2022   CHOLHDL 5.7 12/31/2022   VLDL 32 12/31/2022   LDLCALC 91 12/31/2022   LDLCALC 122 (H) 05/04/2021    Physical Findings: AIMS: Facial and Oral Movements Muscles of Facial Expression: None, normal Lips and Perioral Area: None, normal Jaw: None, normal Tongue: None, normal,Extremity Movements Upper (arms, wrists, hands, fingers): None, normal Lower (legs, knees, ankles, toes): None, normal, Trunk Movements Neck, shoulders, hips: None, normal, Overall Severity Severity of abnormal movements (highest score from questions above): None, normal Incapacitation due to abnormal movements: None, normal Patient's awareness of abnormal movements (rate only patient's report): No Awareness, Dental Status Current problems with teeth and/or dentures?: No Does patient usually wear dentures?: No  CIWA:    COWS:     Musculoskeletal: Strength & Muscle Tone: within normal limits Gait & Station: normal Patient leans: N/A  Psychiatric Specialty Exam:  Presentation  General Appearance:  Appropriate for Environment  Eye Contact: Good  Speech: Clear and Coherent  Speech Volume: Normal  Handedness: -- (unknown)   Mood and Affect  Mood: Depressed  Affect: Congruent   Thought Process  Thought  Processes: Coherent; Goal Directed; Linear  Descriptions of Associations:Intact  Orientation:Full (Time, Place and Person)  Thought Content:Logical  History of Schizophrenia/Schizoaffective disorder:No data recorded Duration of Psychotic Symptoms:No data recorded Hallucinations:No data recorded Ideas of Reference:None  Suicidal Thoughts:No data recorded Homicidal Thoughts:No data recorded  Sensorium  Memory: Immediate Fair; Recent Fair  Judgment: Fair  Insight: Good   Executive Functions  Concentration: Good  Attention Span: Good  Recall: Good  Fund of Knowledge: Good  Language: Good   Psychomotor Activity  Psychomotor Activity:No data recorded  Assets  Assets: Communication Skills; Leisure Time; Resilience   Sleep  Sleep:No data recorded   Physical Exam: Physical Exam Vitals reviewed.  Constitutional:      Appearance: Normal appearance.  HENT:     Head: Normocephalic and atraumatic.     Mouth/Throat:     Pharynx: Oropharynx is clear.  Eyes:     Pupils: Pupils are equal, round, and reactive to light.  Cardiovascular:     Rate and Rhythm: Normal rate and regular rhythm.  Pulmonary:     Effort: Pulmonary effort is normal.     Breath sounds: Normal breath sounds.  Abdominal:     General: Abdomen is flat.     Palpations: Abdomen is soft.  Musculoskeletal:        General: Normal range of motion.  Skin:    General: Skin is warm and dry.  Neurological:     General: No focal deficit present.     Mental Status: He is alert. Mental status is at baseline.  Psychiatric:        Mood and Affect: Mood normal.        Thought Content: Thought content  normal.    Review of Systems  Constitutional: Negative.   HENT: Negative.    Eyes: Negative.   Respiratory: Negative.    Cardiovascular: Negative.   Gastrointestinal: Negative.   Musculoskeletal: Negative.   Skin: Negative.   Neurological: Negative.   Psychiatric/Behavioral: Negative.      Blood pressure 112/70, pulse 85, temperature 97.7 F (36.5 C), temperature source Oral, resp. rate 18, height 5\' 10"  (1.778 m), weight 97.5 kg, SpO2 99 %. Body mass index is 30.85 kg/m.   Treatment Plan Summary: Medication management and Plan no change to medication management.  Still looking into placement options.  Alethia Berthold, MD 01/07/2023, 1:02 PM

## 2023-01-07 NOTE — BHH Group Notes (Signed)
[place] Group Notes:  (Nursing/MHT/Case Management/Adjunct)  Date:  01/07/2023  Time:  9:01 PM  Type of Therapy:   Wrap up  Participation Level:  Active  Participation Quality:  Appropriate  Affect:  Appropriate  Cognitive:  Alert  Insight:  Good  Engagement in Group:  Engaged and goal is be gone he say he being sleeping all day.  Modes of Intervention:  Support  Summary of Progress/Problems:  Cory Floyd 01/07/2023, 9:01 PM

## 2023-01-08 NOTE — Progress Notes (Signed)
[facility] MD Progress Note  01/08/2023 6:08 PM Cory Floyd Subjective: No new complaints.  Out of his room interacting better with other patients.  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   Anginal pain ([place])    Arthritis    Bipolar 1 disorder ([place])    COVID-19    Depressed    Diabetes mellitus without complication ([place])    Dyspnea    Fatty liver    Hypertension    Schizophrenia ([place])     Past Surgical History:  Procedure Laterality Date   COLONOSCOPY  02/05/2014   diverticulosis, hyperplastic polyp x 2   fracture arm Right    INCISION AND DRAINAGE ABSCESS Bilateral 12/29/2022   Procedure: INCISION AND DRAINAGE ABSCESS BILATERAL ARMS;  Surgeon: Shona Needles, MD;  Location: [place];  Service: Orthopedics;  Laterality: Bilateral;   KNEE SURGERY Right 2013   NASAL SEPTUM SURGERY     NASAL TURBINATE REDUCTION Bilateral 12/12/2016   Procedure: TURBINATE REDUCTION/SUBMUCOSAL RESECTION;  Surgeon: Beverly Gust, MD;  Location: [facility];  Service: ENT;  Laterality: Bilateral;   Family History:  Family History  Problem Relation Age of Onset   Hepatitis C Mother    Cancer Mother    Cancer Maternal Aunt    Hypertension Maternal Grandmother    Family Psychiatric  History: See previous Social History:  Social History   Substance and Sexual Activity  Alcohol Use Yes   Comment: 1/2-5th pint of liquor     Social History   Substance and Sexual Activity  Drug Use  Yes   Types: Cocaine, Marijuana   Comment: heroin    Social History   Socioeconomic History   Marital status: Divorced    Spouse name: Not on file   Number of children: Not on file   Years of education: Not on file   Highest education level: Not on file  Occupational History   Not on file  Tobacco Use   Smoking status: Every Day    Packs/day: 1.00    Years: 20.00    Additional pack years: 0.00    Total pack years: 20.00    Types: Cigarettes   Smokeless tobacco: Never  Vaping Use   Vaping Use: Never used  Substance and Sexual Activity   Alcohol use: Yes    Comment: 1/2-5th pint of liquor   Drug use: Yes    Types: Cocaine, Marijuana    Comment: heroin   Sexual activity: Yes    Birth control/protection: None  Other Topics Concern   Not on file  Social History Narrative   Not on file   Social Determinants of Health   Financial Resource Strain: Not on file  Food Insecurity: Food Insecurity Present (12/30/2022)   Hunger Vital Sign    Worried About Running Out of Food in the Last Year: Often true    Ran Out of Food in the Last Year: Often true  Transportation Needs: Unmet Transportation Needs (12/30/2022)   PRAPARE -  Hydrologist (Medical): Yes    Lack of Transportation (Non-Medical): Yes  Physical Activity: Not on file  Stress: Not on file  Social Connections: Not on file   Additional Social History:                         Sleep: Fair  Appetite:  Fair  Current Medications: Current Facility-Administered Medications  Medication Dose Route Frequency Provider Last Rate Last Admin   acetaminophen (TYLENOL) tablet 650 mg  650 mg Oral Q6H PRN [name], [name]   650 mg at 01/04/23 0931   alum & mag hydroxide-simeth (MAALOX/MYLANTA) 200-200-20 MG/5ML suspension 30 mL  30 mL Oral Q4H PRN [name], [name]       ARIPiprazole (ABILIFY) tablet 10 mg  10 mg Oral Daily [name], [name]   10 mg at 01/08/23 1147   cloNIDine  (CATAPRES) tablet 0.2 mg  0.2 mg Oral Q6H PRN [name], DO       diphenhydrAMINE (BENADRYL) capsule 50 mg  50 mg Oral TID PRN [name], [name]       Or   diphenhydrAMINE (BENADRYL) injection 50 mg  50 mg Intramuscular TID PRN [name], [name]       divalproex (DEPAKOTE ER) 24 hr tablet 1,000 mg  1,000 mg Oral Daily [name], [name]   1,000 mg at 01/08/23 1147   doxycycline (VIBRA-TABS) tablet 100 mg  100 mg Oral Q12H Radek Carnero T, MD   100 mg at 01/08/23 1148   fluticasone (FLONASE) 50 MCG/ACT nasal spray 1 spray  1 spray Each Nare Daily [name], [name]   1 spray at 0000000 [id number]   folic acid (FOLVITE) tablet 1 mg  1 mg Oral Daily [name], [name]   1 mg at 01/08/23 1147   haloperidol (HALDOL) tablet 5 mg  5 mg Oral TID PRN [name], [name]       Or   haloperidol lactate (HALDOL) injection 5 mg  5 mg Intramuscular TID PRN [name], [name]       ibuprofen (ADVIL) tablet 600 mg  600 mg Oral Q6H PRN Oveta Idris T, MD       ipratropium (ATROVENT) nebulizer solution 0.5 mg  0.5 mg Nebulization BID PRN Kashina Mecum, [name], MD       LORazepam (ATIVAN) tablet 2 mg  2 mg Oral TID PRN [name], [name]   2 mg at 01/01/23 2104   Or   LORazepam (ATIVAN) injection 2 mg  2 mg Intramuscular TID PRN [name], [name]       losartan (COZAAR) tablet 50 mg  50 mg Oral Daily [name], DO   50 mg at 01/08/23 1148   magnesium hydroxide (MILK OF MAGNESIA) suspension 30 mL  30 mL Oral Daily PRN [name], [name]       metoprolol succinate (TOPROL-XL) 24 hr tablet 50 mg  50 mg Oral Daily [name], DO   50 mg at 01/08/23 1146   mirtazapine (REMERON) tablet 15 mg  15 mg Oral QHS [name], [name]   15 mg at 01/07/23 2115   multivitamin with minerals tablet 1 tablet  1 tablet Oral Daily [name], [name]   1 tablet at 01/08/23 1147   thiamine (VITAMIN B1) tablet 100 mg  100 mg Oral Daily [name], [name]    100 mg at 01/08/23 1147   traZODone (DESYREL) tablet 150 mg  150  mg Oral QHS PRN [name], DO   150 mg at 01/04/23 2054    Lab Results: No results found for this or any previous visit (from the past 48 hour(s)).  Blood Alcohol level:  Lab Results  Component Value Date   ETH <10 12/19/2022   ETH <10 [id number]    Metabolic Disorder Labs: Lab Results  Component Value Date   HGBA1C 5.8 (H) 12/31/2022   MPG 120 12/31/2022   MPG 111 04/02/2021   No results found for: "PROLACTIN" Lab Results  Component Value Date   CHOL 149 12/31/2022   TRIG 160 (H) 12/31/2022   HDL 26 (L) 12/31/2022   CHOLHDL 5.7 12/31/2022   VLDL 32 12/31/2022   LDLCALC 91 12/31/2022   LDLCALC 122 (H) 05/04/2021    Physical Findings: AIMS: Facial and Oral Movements Muscles of Facial Expression: None, normal Lips and Perioral Area: None, normal Jaw: None, normal Tongue: None, normal,Extremity Movements Upper (arms, wrists, hands, fingers): None, normal Lower (legs, knees, ankles, toes): None, normal, Trunk Movements Neck, shoulders, hips: None, normal, Overall Severity Severity of abnormal movements (highest score from questions above): None, normal Incapacitation due to abnormal movements: None, normal Patient's awareness of abnormal movements (rate only patient's report): No Awareness, Dental Status Current problems with teeth and/or dentures?: No Does patient usually wear dentures?: No  CIWA:    COWS:     Musculoskeletal: Strength & Muscle Tone: within normal limits Gait & Station: normal Patient leans: N/A  Psychiatric Specialty Exam:  Presentation  General Appearance:  Appropriate for Environment  Eye Contact: Good  Speech: Clear and Coherent  Speech Volume: Normal  Handedness: -- (unknown)   Mood and Affect  Mood: Depressed  Affect: Congruent   Thought Process  Thought Processes: Coherent; Goal Directed; Linear  Descriptions of  Associations:Intact  Orientation:Full (Time, Place and Person)  Thought Content:Logical  History of Schizophrenia/Schizoaffective disorder:No data recorded Duration of Psychotic Symptoms:No data recorded Hallucinations:No data recorded Ideas of Reference:None  Suicidal Thoughts:No data recorded Homicidal Thoughts:No data recorded  Sensorium  Memory: Immediate Fair; Recent Fair  Judgment: Fair  Insight: Good   Executive Functions  Concentration: Good  Attention Span: Good  Recall: Good  Fund of Knowledge: Good  Language: Good   Psychomotor Activity  Psychomotor Activity:No data recorded  Assets  Assets: Communication Skills; Leisure Time; Resilience   Sleep  Sleep:No data recorded   Physical Exam: Physical Exam Vitals reviewed.  Constitutional:      Appearance: Normal appearance.  HENT:     Head: Normocephalic and atraumatic.     Mouth/Throat:     Pharynx: Oropharynx is clear.  Eyes:     Pupils: Pupils are equal, round, and reactive to light.  Cardiovascular:     Rate and Rhythm: Normal rate and regular rhythm.  Pulmonary:     Effort: Pulmonary effort is normal.     Breath sounds: Normal breath sounds.  Abdominal:     General: Abdomen is flat.     Palpations: Abdomen is soft.  Musculoskeletal:        General: Normal range of motion.  Skin:    General: Skin is warm and dry.  Neurological:     General: No focal deficit present.     Mental Status: He is alert. Mental status is at baseline.  Psychiatric:        Mood and Affect: Mood normal.        Thought Content: Thought content normal.    Review of Systems  Constitutional: Negative.   HENT: Negative.    Eyes: Negative.   Respiratory: Negative.    Cardiovascular: Negative.   Gastrointestinal: Negative.   Musculoskeletal: Negative.   Skin: Negative.   Neurological: Negative.   Psychiatric/Behavioral: Negative.     Blood pressure 112/70, pulse 85, temperature 97.7 F (36.5  C), temperature source Oral, resp. rate 18, height 5\' 10"  (1.778 m), weight 97.5 kg, SpO2 99 %. Body mass index is 30.85 kg/m.   Treatment Plan Summary: Medication management and Plan no change to medication.  We are waiting for his money to get available so he can get into some kind of living program.  Alethia Berthold, MD 01/08/2023, 6:08 PM

## 2023-01-08 NOTE — BHH Group Notes (Signed)
[place] Group Notes:  (Nursing/MHT/Case Management/Adjunct)  Date:  01/08/2023  Time:  9:51 AM  Type of Therapy:   Community Meeting  Participation Level:  Did Not Attend   Adela Lank [facility] 01/08/2023, 9:51 AM

## 2023-01-08 NOTE — BHH Group Notes (Signed)
[facility] Group Notes:  (Nursing/MHT/Case Management/Adjunct)  Date:  01/08/2023  Time:  8:44 PM  Type of Therapy:  Group Therapy  Participation Level:  Active  Participation Quality:  Appropriate  Affect:  Appropriate and Blunted  Cognitive:  Alert and Appropriate  Insight:  Good  Engagement in Group:  Engaged and Improving  Modes of Intervention:  Discussion and Education  Summary of Progress/Problems:  [name],Cory Floyd 01/08/2023, 8:44 PM

## 2023-01-08 NOTE — Group Note (Signed)
Recreation Therapy Group Note   Group Topic:Goal Setting  Group Date: 01/08/2023 Start Time: 1000 End Time: 1045 Facilitators: Vilma Prader, LRT, CTRS Location:  Craft Room  Group Description: Scientist, physiological. Patients were given many different magazines, a glue stick, markers, and a piece of cardstock paper. LRT and pts discussed the importance of having goals in life. LRT and pts discussed the difference between short-term and long-term goals. LRT encouraged pts to create a vision board, with images they picked and then cut out by LRT from the magazine, for themselves, that capture their short and long-term goals. On the back of the paper, pt encouraged to write 3 different coping skills that can help them reach those goals. LRT encouraged pts to show and explain their vision board to the group once complete. LRT offered to laminate vision board once dry and complete.    Affect/Mood: N/A   Participation Level: Did not attend    Clinical Observations/Individualized Feedback: Amir did not attend group due to resting in his room.  Plan: Continue to engage patient in RT group sessions 2-3x/week.   Vilma Prader, LRT, CTRS 01/08/2023 11:18 AM

## 2023-01-08 NOTE — Plan of Care (Signed)
Patient was kind of irritable this morning because he could not sleep good last night. Patient also refused morning medicine but took before lunch.Patient states " I don't know how I feel. People just leave my door opened and I couldn't sleep." Patient denies SI,HI and AVH. Patient stays in bed most of the shift. Appetite and energy good. Support and encouragement given.

## 2023-01-08 NOTE — Progress Notes (Signed)
Pt affect was brighter this shift compared with yesterday.  Pt did not refuse medications and was pleasant at the med window.  Denies SI Hi AVH.  01/08/23 0100  Psych Admission Type (Psych Patients Only)  Admission Status Voluntary  Psychosocial Assessment  Patient Complaints None  Eye Contact Brief;Fair  Facial Expression Animated  Affect Appropriate to circumstance  Speech Logical/coherent  Interaction Assertive;Isolative  Motor Activity Slow  Appearance/Hygiene Unremarkable  Behavior Characteristics Cooperative  Mood Pleasant  Thought Process  Coherency WDL  Content WDL  Delusions None reported or observed  Perception WDL  Hallucination None reported or observed  Judgment Impaired  Confusion None  Danger to Self  Current suicidal ideation? Denies

## 2023-01-08 NOTE — Group Note (Signed)
[facility] LCSW Group Therapy Note    Group Date: 01/08/2023 Start Time: 1300 End Time: 1400  Type of Therapy and Topic:  Group Therapy:  Overcoming Obstacles  Participation Level:  [facility] PARTICIPATION LEVEL: Did Not Attend  Mood:  Description of Group:   In this group patients will be encouraged to explore what they see as obstacles to their own wellness and recovery. They will be guided to discuss their thoughts, feelings, and behaviors related to these obstacles. The group will process together ways to cope with barriers, with attention given to specific choices patients can make. Each patient will be challenged to identify changes they are motivated to make in order to overcome their obstacles. This group will be process-oriented, with patients participating in exploration of their own experiences as well as giving and receiving support and challenge from other group members.  Therapeutic Goals: 1. Patient will identify personal and current obstacles as they relate to admission. 2. Patient will identify barriers that currently interfere with their wellness or overcoming obstacles.  3. Patient will identify feelings, thought process and behaviors related to these barriers. 4. Patient will identify two changes they are willing to make to overcome these obstacles:    Summary of Patient Progress Patient did not attend group despite encouraged participation.     Therapeutic Modalities:   Cognitive Behavioral Therapy Solution Focused Therapy Motivational Interviewing Relapse Prevention Therapy   [name], [place]

## 2023-01-09 NOTE — Plan of Care (Signed)
D: Patient alert and oriented. Patient denies pain. Patient denies anxiety and depression. Patient denies SI/HI/AVH. Patient remains isolative to room during shift with exception to coming out for meals. Patient refused scheduled medication during shift.  A:  Q15 minute safety checks maintained.   R:  Patient remains safe on the unit at this time. Problem: Education: Goal: Knowledge of General Education information will improve Description: Including pain rating scale, medication(s)/side effects and non-pharmacologic comfort measures Outcome: Progressing   Problem: Nutrition: Goal: Adequate nutrition will be maintained Outcome: Progressing   Problem: Coping: Goal: Level of anxiety will decrease Outcome: Progressing

## 2023-01-09 NOTE — Group Note (Signed)
Date:  01/09/2023 Time:  9:16 PM  Group Topic/Focus:  Wrap-Up Group:   The focus of this group is to help patients review their daily goal of treatment and discuss progress on daily workbooks.    Participation Level:  Active  Participation Quality:  Appropriate, Attentive, and Supportive  Affect:  Appropriate and Excited  Cognitive:  Alert, Appropriate, and Oriented  Insight: Appropriate, Good, and Improving  Engagement in Group:  Engaged and Improving  Modes of Intervention:  Clarification  Additional Comments:     Elisavet Buehrer and Lilian Coma 01/09/2023, 9:16 PM

## 2023-01-09 NOTE — Progress Notes (Signed)
Patient refused antibiotic.

## 2023-01-09 NOTE — Group Note (Signed)
Recreation Therapy Group Note   Group Topic:Healthy Support Systems  Group Date: 01/09/2023 Start Time: 1000 End Time: 1055 Facilitators: Vilma Prader, LRT, CTRS Location:  Craft Room  Group Description: Straw Bridge. Patients were given 10 plastic drinking straws and an equal length of masking tape. Using the materials provided, patients were instructed to build a free-standing bridge-like structure to suspend an everyday item (ex: deck of cards) off the floor or table surface. All materials were required to be used in Conservation officer, nature. LRT facilitated post-activity discussion reviewing the importance of having strong and healthy support systems in our lives. LRT discussed how the people in our lives serve as the tape and the book we placed on top of our straw structure are the stressors we face in daily life. LRT and pts discussed what happens in our life when things get too heavy for [place], and we don't have strong supports outside of the hospital. Pt shared 2 of their healthy supports aloud in the group.   Affect/Mood: N/A   Participation Level: Did not attend    Clinical Observations/Individualized Feedback: Eic did not attend group due to resting in his room.  Plan: Continue to engage patient in RT group sessions 2-3x/week.   Vilma Prader, LRT, CTRS 01/09/2023 11:32 AM

## 2023-01-09 NOTE — Progress Notes (Signed)
[facility] MD Progress Note  01/09/2023 4:25 PM Cory Floyd  MRN:  [id number] Subjective: Follow-up 47 year old man with bipolar disorder and substance abuse problems.  No new complaints today.  No behavior issues today.  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   Anginal pain ([place])    Arthritis    Bipolar 1 disorder ([place])    COVID-19    Depressed    Diabetes mellitus without complication ([place])    Dyspnea    Fatty liver    Hypertension    Schizophrenia ([place])     Past Surgical History:  Procedure Laterality Date   COLONOSCOPY  02/05/2014   diverticulosis, hyperplastic polyp x 2   fracture arm Right    INCISION AND DRAINAGE ABSCESS Bilateral 12/29/2022   Procedure: INCISION AND DRAINAGE ABSCESS BILATERAL ARMS;  Surgeon: Shona Needles, MD;  Location: [place];  Service: Orthopedics;  Laterality: Bilateral;   KNEE SURGERY Right 2013   NASAL SEPTUM SURGERY     NASAL TURBINATE REDUCTION Bilateral 12/12/2016   Procedure: TURBINATE REDUCTION/SUBMUCOSAL RESECTION;  Surgeon: Beverly Gust, MD;  Location: [facility];  Service: ENT;  Laterality: Bilateral;   Family History:  Family History  Problem Relation Age of Onset   Hepatitis C Mother    Cancer Mother    Cancer Maternal Aunt    Hypertension Maternal Grandmother    Family Psychiatric  History: See above Social History:  Social History   Substance and Sexual Activity  Alcohol Use Yes   Comment: 1/2-5th  pint of liquor     Social History   Substance and Sexual Activity  Drug Use Yes   Types: Cocaine, Marijuana   Comment: heroin    Social History   Socioeconomic History   Marital status: Divorced    Spouse name: Not on file   Number of children: Not on file   Years of education: Not on file   Highest education level: Not on file  Occupational History   Not on file  Tobacco Use   Smoking status: Every Day    Packs/day: 1.00    Years: 20.00    Additional pack years: 0.00    Total pack years: 20.00    Types: Cigarettes   Smokeless tobacco: Never  Vaping Use   Vaping Use: Never used  Substance and Sexual Activity   Alcohol use: Yes    Comment: 1/2-5th pint of liquor   Drug use: Yes    Types: Cocaine, Marijuana    Comment: heroin   Sexual activity: Yes    Birth control/protection: None  Other Topics Concern   Not on file  Social History Narrative   Not on file   Social Determinants of Health   Financial Resource Strain: Not on file  Food Insecurity: Food Insecurity Present (12/30/2022)   Hunger Vital Sign    Worried About Running Out of Food in the Last Year: Often true    Ran Out of Food in the Last Year: Often  true  Transportation Needs: Unmet Transportation Needs (12/30/2022)   PRAPARE - Hydrologist (Medical): Yes    Lack of Transportation (Non-Medical): Yes  Physical Activity: Not on file  Stress: Not on file  Social Connections: Not on file   Additional Social History:                         Sleep: Fair  Appetite:  Fair  Current Medications: Current Facility-Administered Medications  Medication Dose Route Frequency Provider Last Rate Last Admin   acetaminophen (TYLENOL) tablet 650 mg  650 mg Oral Q6H PRN [name], [name]   650 mg at 01/04/23 0931   alum & mag hydroxide-simeth (MAALOX/MYLANTA) 200-200-20 MG/5ML suspension 30 mL  30 mL Oral Q4H PRN [name], [name]       ARIPiprazole (ABILIFY) tablet 10  mg  10 mg Oral Daily [name], [name]   10 mg at 01/08/23 1147   cloNIDine (CATAPRES) tablet 0.2 mg  0.2 mg Oral Q6H PRN [name], DO       diphenhydrAMINE (BENADRYL) capsule 50 mg  50 mg Oral TID PRN [name], [name]       Or   diphenhydrAMINE (BENADRYL) injection 50 mg  50 mg Intramuscular TID PRN [name], [name]       divalproex (DEPAKOTE ER) 24 hr tablet 1,000 mg  1,000 mg Oral Daily [name], [name]   1,000 mg at 01/08/23 1147   doxycycline (VIBRA-TABS) tablet 100 mg  100 mg Oral Q12H Anyiah Coverdale T, MD   100 mg at 01/08/23 1148   fluticasone (FLONASE) 50 MCG/ACT nasal spray 1 spray  1 spray Each Nare Daily [name], [name]   1 spray at 73/71/06 2694   folic acid (FOLVITE) tablet 1 mg  1 mg Oral Daily [name], [name]   1 mg at 01/08/23 1147   haloperidol (HALDOL) tablet 5 mg  5 mg Oral TID PRN [name], [name]       Or   haloperidol lactate (HALDOL) injection 5 mg  5 mg Intramuscular TID PRN [name], [name]       ibuprofen (ADVIL) tablet 600 mg  600 mg Oral Q6H PRN Flordia Kassem T, MD       ipratropium (ATROVENT) nebulizer solution 0.5 mg  0.5 mg Nebulization BID PRN Jazlen Ogarro, [name], MD       LORazepam (ATIVAN) tablet 2 mg  2 mg Oral TID PRN [name], [name]   2 mg at 01/01/23 2104   Or   LORazepam (ATIVAN) injection 2 mg  2 mg Intramuscular TID PRN [name], [name]       losartan (COZAAR) tablet 50 mg  50 mg Oral Daily [name], DO   50 mg at 01/08/23 1148   magnesium hydroxide (MILK OF MAGNESIA) suspension 30 mL  30 mL Oral Daily PRN [name], [name]       metoprolol succinate (TOPROL-XL) 24 hr tablet 50 mg  50 mg Oral Daily [name], DO   50 mg at 01/08/23 1146   mirtazapine (REMERON) tablet 15 mg  15 mg Oral QHS [name], [name]   15 mg at 01/09/23 0048   multivitamin with minerals tablet 1 tablet  1 tablet Oral Daily [name], [name]   1 tablet at 01/08/23 1147    thiamine (VITAMIN B1) tablet 100 mg  100 mg Oral Daily [name], [name]   100 mg  at 01/08/23 1147   traZODone (DESYREL) tablet 150 mg  150 mg Oral QHS PRN [name], DO   150 mg at 01/09/23 0047    Lab Results: No results found for this or any previous visit (from the past 48 hour(s)).  Blood Alcohol level:  Lab Results  Component Value Date   ETH <10 12/19/2022   ETH <10 [id number]    Metabolic Disorder Labs: Lab Results  Component Value Date   HGBA1C 5.8 (H) 12/31/2022   MPG 120 12/31/2022   MPG 111 04/02/2021   No results found for: "PROLACTIN" Lab Results  Component Value Date   CHOL 149 12/31/2022   TRIG 160 (H) 12/31/2022   HDL 26 (L) 12/31/2022   CHOLHDL 5.7 12/31/2022   VLDL 32 12/31/2022   LDLCALC 91 12/31/2022   LDLCALC 122 (H) 05/04/2021    Physical Findings: AIMS: Facial and Oral Movements Muscles of Facial Expression: None, normal Lips and Perioral Area: None, normal Jaw: None, normal Tongue: None, normal,Extremity Movements Upper (arms, wrists, hands, fingers): None, normal Lower (legs, knees, ankles, toes): None, normal, Trunk Movements Neck, shoulders, hips: None, normal, Overall Severity Severity of abnormal movements (highest score from questions above): None, normal Incapacitation due to abnormal movements: None, normal Patient's awareness of abnormal movements (rate only patient's report): No Awareness, Dental Status Current problems with teeth and/or dentures?: No Does patient usually wear dentures?: No  CIWA:    COWS:     Musculoskeletal: Strength & Muscle Tone: within normal limits Gait & Station: normal Patient leans: N/A  Psychiatric Specialty Exam:  Presentation  General Appearance:  Appropriate for Environment  Eye Contact: Good  Speech: Clear and Coherent  Speech Volume: Normal  Handedness: -- (unknown)   Mood and Affect  Mood: Depressed  Affect: Congruent   Thought Process  Thought  Processes: Coherent; Goal Directed; Linear  Descriptions of Associations:Intact  Orientation:Full (Time, Place and Person)  Thought Content:Logical  History of Schizophrenia/Schizoaffective disorder:No data recorded Duration of Psychotic Symptoms:No data recorded Hallucinations:No data recorded Ideas of Reference:None  Suicidal Thoughts:No data recorded Homicidal Thoughts:No data recorded  Sensorium  Memory: Immediate Fair; Recent Fair  Judgment: Fair  Insight: Good   Executive Functions  Concentration: Good  Attention Span: Good  Recall: Good  Fund of Knowledge: Good  Language: Good   Psychomotor Activity  Psychomotor Activity:No data recorded  Assets  Assets: Communication Skills; Leisure Time; Resilience   Sleep  Sleep:No data recorded   Physical Exam: Physical Exam Vitals and nursing note reviewed.  Constitutional:      Appearance: Normal appearance.  HENT:     Head: Normocephalic and atraumatic.     Mouth/Throat:     Pharynx: Oropharynx is clear.  Eyes:     Pupils: Pupils are equal, round, and reactive to light.  Cardiovascular:     Rate and Rhythm: Normal rate and regular rhythm.  Pulmonary:     Effort: Pulmonary effort is normal.     Breath sounds: Normal breath sounds.  Abdominal:     General: Abdomen is flat.     Palpations: Abdomen is soft.  Musculoskeletal:        General: Normal range of motion.  Skin:    General: Skin is warm and dry.  Neurological:     General: No focal deficit present.     Mental Status: He is alert. Mental status is at baseline.  Psychiatric:        Attention and Perception: Attention normal.  Mood and Affect: Mood normal.        Speech: Speech normal.        Behavior: Behavior normal.        Thought Content: Thought content normal.        Cognition and Memory: Cognition normal.    Review of Systems  Constitutional: Negative.   HENT: Negative.    Eyes: Negative.   Respiratory:  Negative.    Cardiovascular: Negative.   Gastrointestinal: Negative.   Musculoskeletal: Negative.   Skin: Negative.   Neurological: Negative.   Psychiatric/Behavioral: Negative.     Blood pressure (!) 95/52, pulse 71, temperature 98.8 F (37.1 C), resp. rate 18, height 5\' 10"  (1.778 m), weight 97.5 kg, SpO2 97 %. Body mass index is 30.85 kg/m.   Treatment Plan Summary: Medication management and Plan no change to treatment plan.  Continues to be here primarily awaiting placement.  Alethia Berthold, MD 01/09/2023, 4:25 PM

## 2023-01-09 NOTE — Group Note (Addendum)
Date:  01/09/2023 Time:  4:30 PM  Group Topic/Focus:  Activity Group    Participation Level:  Did Not Attend   Adela Lank [facility] 01/09/2023, 4:30 PM

## 2023-01-09 NOTE — Progress Notes (Signed)
Patient pleasant and cooperative this shift. In bed resting eyes closed most of early shift. Request prn for sleep when awake. Given with good relief. Denies Si, Hi, AVH. Endorses some depression and anxiety. No other complaints or concerns voiced. Encouragement and support provided. Safety checks maintained. Medications given as prescribed. Pt receptive and remains safe on unit with q 15 min checks.

## 2023-01-09 NOTE — Group Note (Signed)
Date:  01/09/2023 Time:  6:34 PM  Group Topic/Focus:  Outside time, music group, motion group   Participation Level:  Did Not Attend   Ladona Mow 01/09/2023, 6:34 PM

## 2023-01-09 NOTE — Group Note (Signed)
Date:  01/09/2023 Time:  6:03 PM  Group Topic/Focus:  Community Meeting    Participation Level:  Did Not Attend   Adela Lank [facility] 01/09/2023, 6:03 PM

## 2023-01-09 NOTE — Group Note (Signed)
[facility] LCSW Group Therapy Note   Group Date: 01/09/2023 Start Time: 1300 End Time: 1400  Type of Therapy/Topic:  Group Therapy:  Feelings about Diagnosis  Participation Level:  Did Not Attend    Description of Group:    This group will allow patients to explore their thoughts and feelings about diagnoses they have received. Patients will be guided to explore their level of understanding and acceptance of these diagnoses. Facilitator will encourage patients to process their thoughts and feelings about the reactions of others to their diagnosis, and will guide patients in identifying ways to discuss their diagnosis with significant others in their lives. This group will be process-oriented, with patients participating in exploration of their own experiences as well as giving and receiving support and challenge from other group members.   Therapeutic Goals: 1. Patient will demonstrate understanding of diagnosis as evidence by identifying two or more symptoms of the disorder:  2. Patient will be able to express two feelings regarding the diagnosis 3. Patient will demonstrate ability to communicate their needs through discussion and/or role plays  Summary of Patient Progress: X   Therapeutic Modalities:   Cognitive Behavioral Therapy Brief Therapy Feelings Identification    Shirl Harris, LCSW

## 2023-01-09 NOTE — Plan of Care (Signed)

## 2023-01-10 NOTE — Group Note (Signed)
Date:  01/10/2023 Time:  10:30 AM  Group Topic/Focus:  Community Meeting    Participation Level:  Did Not Attend   Adela Lank [facility] 01/10/2023, 10:30 AM

## 2023-01-10 NOTE — Group Note (Signed)
Recreation Therapy Group Note   Group Topic:Problem Solving  Group Date: 01/10/2023 Start Time: 1000 End Time: 1055 Facilitators: Vilma Prader, LRT, CTRS Location:  Craft Room  Group Description: Life Boat. Patients were given the scenario that they are on a boat that is about to become shipwrecked, leaving them stranded on an [place]. They are asked to make a list of 15 different items that they want to take with them when they are stranded on the [place]. Patients are asked to rank their items from most important to least important, #1 being the most important and #15 being the least. Patients will work individually for the first round to come up with 15 items and then pair up with a peer(s) to condense their list and come up with one list of 15 items between the two of them. Patients or LRT will read aloud the 15 different items to the group after each round. LRT facilitated post-activity processing to discuss how this activity can be used in daily life post discharge.    Affect/Mood: N/A   Participation Level: Did not attend    Clinical Observations/Individualized Feedback: Chalon did not attend group due to resting in his room.  Plan: Continue to engage patient in RT group sessions 2-3x/week.   Vilma Prader, LRT, CTRS 01/10/2023 11:22 AM

## 2023-01-10 NOTE — Group Note (Signed)
[facility] LCSW Group Therapy Note   Group Date: 01/10/2023 Start Time: 1300 End Time: 1400   Type of Therapy/Topic:  Group Therapy:  Emotion Regulation  Participation Level:  Did Not Attend   Mood:  Description of Group:    The purpose of this group is to assist patients in learning to regulate negative emotions and experience positive emotions. Patients will be guided to discuss ways in which they have been vulnerable to their negative emotions. These vulnerabilities will be juxtaposed with experiences of positive emotions or situations, and patients challenged to use positive emotions to combat negative ones. Special emphasis will be placed on coping with negative emotions in conflict situations, and patients will process healthy conflict resolution skills.  Therapeutic Goals: Patient will identify two positive emotions or experiences to reflect on in order to balance out negative emotions:  Patient will label two or more emotions that they find the most difficult to experience:  Patient will be able to demonstrate positive conflict resolution skills through discussion or role plays:   Summary of Patient Progress:   X    Therapeutic Modalities:   Cognitive Behavioral Therapy Feelings Identification Dialectical Behavioral Therapy   Rozann Lesches, LCSW

## 2023-01-10 NOTE — Plan of Care (Signed)
  Problem: Education: Goal: Knowledge of General Education information will improve Description: Including pain rating scale, medication(s)/side effects and non-pharmacologic comfort measures Outcome: Progressing   Problem: Activity: Goal: Risk for activity intolerance will decrease Outcome: Progressing   Problem: Nutrition: Goal: Adequate nutrition will be maintained Outcome: Progressing   Problem: Coping: Goal: Level of anxiety will decrease Outcome: Progressing   

## 2023-01-10 NOTE — Progress Notes (Signed)
Pt was calm and cooperative, med compliant on PM shift.  Denies SI HI AVH.  Pt is appropriately social in the milieu. Continued monitoring for safety.    01/10/23 0000  Psych Admission Type (Psych Patients Only)  Admission Status Voluntary  Psychosocial Assessment  Patient Complaints None  Eye Contact Brief  Facial Expression Animated  Affect Appropriate to circumstance  Speech Logical/coherent  Interaction Assertive;Isolative  Motor Activity Slow  Appearance/Hygiene Unremarkable  Behavior Characteristics Appropriate to situation  Mood Pleasant  Thought Process  Coherency WDL  Content WDL  Delusions None reported or observed  Perception WDL  Hallucination None reported or observed  Judgment Impaired  Confusion None  Danger to Self  Current suicidal ideation? Denies  Danger to Others  Danger to Others None reported or observed

## 2023-01-10 NOTE — Progress Notes (Signed)
Pt denies SI/HI/AVH and verbally agrees to approach staff if these become apparent or before harming themselves/others. Rates depression 0/10. Rates anxiety 0/10. Rates pain 0/10.  Dressing applied at 1240. Pt has been in his room all day. Scheduled medications administered to pt, per MD orders. RN provided support and encouragement to pt. Q15 min safety checks implemented and continued. Pt is safe on the unit. Plan of care on going and no other concerns expressed at this time.  01/10/23 0828  Psych Admission Type (Psych Patients Only)  Admission Status Voluntary  Psychosocial Assessment  Patient Complaints None  Eye Contact Fair  Facial Expression Animated  Affect Appropriate to circumstance  Speech Logical/coherent  Interaction Assertive;Isolative  Motor Activity Slow  Appearance/Hygiene Unremarkable  Behavior Characteristics Cooperative;Appropriate to situation;Calm  Mood Pleasant  Aggressive Behavior  Effect No apparent injury  Thought Process  Coherency WDL  Content WDL  Delusions None reported or observed  Perception WDL  Hallucination None reported or observed  Judgment Impaired  Confusion None  Danger to Self  Current suicidal ideation? Denies  Danger to Others  Danger to Others None reported or observed

## 2023-01-10 NOTE — Progress Notes (Signed)
[facility] MD Progress Note  01/10/2023 2:04 PM Cory Floyd  MRN:  [id number] Subjective: Follow-up patient with bipolar disorder and substance abuse.  No new complaints.  Stays in bed much of the time.  Does get up and interact at times.  Physically stable.  Still waiting for a place to go. 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   Anginal pain ([place])    Arthritis    Bipolar 1 disorder ([place])    COVID-19    Depressed    Diabetes mellitus without complication ([place])    Dyspnea    Fatty liver    Hypertension    Schizophrenia ([place])     Past Surgical History:  Procedure Laterality Date   COLONOSCOPY  02/05/2014   diverticulosis, hyperplastic polyp x 2   fracture arm Right    INCISION AND DRAINAGE ABSCESS Bilateral 12/29/2022   Procedure: INCISION AND DRAINAGE ABSCESS BILATERAL ARMS;  Surgeon: Shona Needles, MD;  Location: [place];  Service: Orthopedics;  Laterality: Bilateral;   KNEE SURGERY Right 2013   NASAL SEPTUM SURGERY     NASAL TURBINATE REDUCTION Bilateral 12/12/2016   Procedure: TURBINATE REDUCTION/SUBMUCOSAL RESECTION;  Surgeon: Beverly Gust, MD;  Location: [facility];  Service: ENT;  Laterality: Bilateral;   Family History:  Family History  Problem Relation Age of Onset   Hepatitis C Mother    Cancer Mother    Cancer Maternal Aunt    Hypertension Maternal Grandmother    Family Psychiatric  History: See previous Social History:  Social History   Substance and Sexual Activity  Alcohol Use  Yes   Comment: 1/2-5th pint of liquor     Social History   Substance and Sexual Activity  Drug Use Yes   Types: Cocaine, Marijuana   Comment: heroin    Social History   Socioeconomic History   Marital status: Divorced    Spouse name: Not on file   Number of children: Not on file   Years of education: Not on file   Highest education level: Not on file  Occupational History   Not on file  Tobacco Use   Smoking status: Every Day    Packs/day: 1.00    Years: 20.00    Additional pack years: 0.00    Total pack years: 20.00    Types: Cigarettes   Smokeless tobacco: Never  Vaping Use   Vaping Use: Never used  Substance and Sexual Activity   Alcohol use: Yes    Comment: 1/2-5th pint of liquor   Drug use: Yes    Types: Cocaine, Marijuana    Comment: heroin   Sexual activity: Yes    Birth control/protection: None  Other Topics Concern   Not on file  Social History Narrative   Not on file   Social Determinants of Health   Financial Resource Strain: Not on file  Food Insecurity: Food Insecurity Present (12/30/2022)   Hunger Vital Sign    Worried About Running Out of Food in the Last Year: Often true  Ran Out of Food in the Last Year: Often true  Transportation Needs: Unmet Transportation Needs (12/30/2022)   PRAPARE - Hydrologist (Medical): Yes    Lack of Transportation (Non-Medical): Yes  Physical Activity: Not on file  Stress: Not on file  Social Connections: Not on file   Additional Social History:                         Sleep: Fair  Appetite:  Fair  Current Medications: Current Facility-Administered Medications  Medication Dose Route Frequency Provider Last Rate Last Admin   acetaminophen (TYLENOL) tablet 650 mg  650 mg Oral Q6H PRN [name], [name]   650 mg at 01/04/23 0931   alum & mag hydroxide-simeth (MAALOX/MYLANTA) 200-200-20 MG/5ML suspension 30 mL  30 mL Oral Q4H PRN [name], [name]        ARIPiprazole (ABILIFY) tablet 10 mg  10 mg Oral Daily [name], [name]   10 mg at 01/10/23 [id number]   cloNIDine (CATAPRES) tablet 0.2 mg  0.2 mg Oral Q6H PRN [name], DO       diphenhydrAMINE (BENADRYL) capsule 50 mg  50 mg Oral TID PRN [name], [name]       Or   diphenhydrAMINE (BENADRYL) injection 50 mg  50 mg Intramuscular TID PRN [name], [name]       divalproex (DEPAKOTE ER) 24 hr tablet 1,000 mg  1,000 mg Oral Daily [name], [name]   1,000 mg at 01/10/23 [id number]   doxycycline (VIBRA-TABS) tablet 100 mg  100 mg Oral Q12H Derotha Fishbaugh T, MD   100 mg at 01/10/23 1003   fluticasone (FLONASE) 50 MCG/ACT nasal spray 1 spray  1 spray Each Nare Daily [name], [name]   1 spray at 0000000 [id number]   folic acid (FOLVITE) tablet 1 mg  1 mg Oral Daily [name], [name]   1 mg at 01/10/23 [id number]   haloperidol (HALDOL) tablet 5 mg  5 mg Oral TID PRN [name], [name]       Or   haloperidol lactate (HALDOL) injection 5 mg  5 mg Intramuscular TID PRN [name], [name]       ibuprofen (ADVIL) tablet 600 mg  600 mg Oral Q6H PRN Melysa Schroyer T, MD       ipratropium (ATROVENT) nebulizer solution 0.5 mg  0.5 mg Nebulization BID PRN Nayvie Lips, [name], MD       LORazepam (ATIVAN) tablet 2 mg  2 mg Oral TID PRN [name], [name]   2 mg at 01/01/23 2104   Or   LORazepam (ATIVAN) injection 2 mg  2 mg Intramuscular TID PRN [name], [name]       losartan (COZAAR) tablet 50 mg  50 mg Oral Daily [name], DO   50 mg at 01/10/23 [id number]   magnesium hydroxide (MILK OF MAGNESIA) suspension 30 mL  30 mL Oral Daily PRN [name], [name]       metoprolol succinate (TOPROL-XL) 24 hr tablet 50 mg  50 mg Oral Daily [name], DO   50 mg at 01/08/23 1146   mirtazapine (REMERON) tablet 15 mg  15 mg Oral QHS [name], [name]   15 mg at 01/09/23 2124   multivitamin with minerals tablet 1 tablet  1 tablet Oral Daily [name],  [name]   1 tablet at 01/10/23 [id number]   thiamine (VITAMIN B1) tablet 100 mg  100 mg  Oral Daily [name], [name]   100 mg at 01/10/23 [id number]   traZODone (DESYREL) tablet 150 mg  150 mg Oral QHS PRN [name], DO   150 mg at 01/09/23 2123    Lab Results: No results found for this or any previous visit (from the past 48 hour(s)).  Blood Alcohol level:  Lab Results  Component Value Date   ETH <10 12/19/2022   ETH <10 [id number]    Metabolic Disorder Labs: Lab Results  Component Value Date   HGBA1C 5.8 (H) 12/31/2022   MPG 120 12/31/2022   MPG 111 04/02/2021   No results found for: "PROLACTIN" Lab Results  Component Value Date   CHOL 149 12/31/2022   TRIG 160 (H) 12/31/2022   HDL 26 (L) 12/31/2022   CHOLHDL 5.7 12/31/2022   VLDL 32 12/31/2022   LDLCALC 91 12/31/2022   LDLCALC 122 (H) 05/04/2021    Physical Findings: AIMS: Facial and Oral Movements Muscles of Facial Expression: None, normal Lips and Perioral Area: None, normal Jaw: None, normal Tongue: None, normal,Extremity Movements Upper (arms, wrists, hands, fingers): None, normal Lower (legs, knees, ankles, toes): None, normal, Trunk Movements Neck, shoulders, hips: None, normal, Overall Severity Severity of abnormal movements (highest score from questions above): None, normal Incapacitation due to abnormal movements: None, normal Patient's awareness of abnormal movements (rate only patient's report): No Awareness, Dental Status Current problems with teeth and/or dentures?: No Does patient usually wear dentures?: No  CIWA:    COWS:     Musculoskeletal: Strength & Muscle Tone: within normal limits Gait & Station: normal Patient leans: N/A  Psychiatric Specialty Exam:  Presentation  General Appearance:  Appropriate for Environment  Eye Contact: Good  Speech: Clear and Coherent  Speech Volume: Normal  Handedness: -- (unknown)   Mood and Affect   Mood: Depressed  Affect: Congruent   Thought Process  Thought Processes: Coherent; Goal Directed; Linear  Descriptions of Associations:Intact  Orientation:Full (Time, Place and Person)  Thought Content:Logical  History of Schizophrenia/Schizoaffective disorder:No data recorded Duration of Psychotic Symptoms:No data recorded Hallucinations:No data recorded Ideas of Reference:None  Suicidal Thoughts:No data recorded Homicidal Thoughts:No data recorded  Sensorium  Memory: Immediate Fair; Recent Fair  Judgment: Fair  Insight: Good   Executive Functions  Concentration: Good  Attention Span: Good  Recall: Good  Fund of Knowledge: Good  Language: Good   Psychomotor Activity  Psychomotor Activity:No data recorded  Assets  Assets: Communication Skills; Leisure Time; Resilience   Sleep  Sleep:No data recorded   Physical Exam: Physical Exam Vitals and nursing note reviewed.  Constitutional:      Appearance: Normal appearance.  HENT:     Head: Normocephalic and atraumatic.     Mouth/Throat:     Pharynx: Oropharynx is clear.  Eyes:     Pupils: Pupils are equal, round, and reactive to light.  Cardiovascular:     Rate and Rhythm: Normal rate and regular rhythm.  Pulmonary:     Effort: Pulmonary effort is normal.     Breath sounds: Normal breath sounds.  Abdominal:     General: Abdomen is flat.     Palpations: Abdomen is soft.  Musculoskeletal:        General: Normal range of motion.  Skin:    General: Skin is warm and dry.  Neurological:     General: No focal deficit present.     Mental Status: He is alert. Mental status is at baseline.  Psychiatric:  Attention and Perception: Attention normal.        Mood and Affect: Mood normal.        Speech: Speech normal.        Behavior: Behavior normal.        Thought Content: Thought content normal.        Cognition and Memory: Cognition normal.    Review of Systems  Constitutional:  Negative.   HENT: Negative.    Eyes: Negative.   Respiratory: Negative.    Cardiovascular: Negative.   Gastrointestinal: Negative.   Musculoskeletal: Negative.   Skin: Negative.   Neurological: Negative.   Psychiatric/Behavioral: Negative.     Blood pressure (!) 93/59, pulse 90, temperature 98.6 F (37 C), temperature source Oral, resp. rate 18, height 5\' 10"  (1.778 m), weight 97.5 kg, SpO2 100 %. Body mass index is 30.85 kg/m.   Treatment Plan Summary: Medication management and Plan no change to medication.  Follow-up daily while we are waiting for his funds to come in.  Alethia Berthold, MD 01/10/2023, 2:04 PM

## 2023-01-10 NOTE — Group Note (Signed)
Date:  01/10/2023 Time:  5:30 PM  Group Topic/Focus:  Activity Group    Participation Level:  Active  Participation Quality:  Appropriate  Affect:  Appropriate  Cognitive:  Appropriate  Insight: Appropriate  Engagement in Group:  Engaged  Modes of Intervention:  Activity  Additional Comments:    Adela Lank Anvi Mangal 01/10/2023, 5:30 PM

## 2023-01-11 NOTE — Group Note (Signed)
LCSW Group Therapy Note  Group Date: 01/11/2023 Start Time: 1300 End Time: 1400   Type of Therapy and Topic:  Group Therapy - Healthy vs Unhealthy Coping Skills  Participation Level:  Did Not Attend   Description of Group The focus of this group was to determine what unhealthy coping techniques typically are used by group members and what healthy coping techniques would be helpful in coping with various problems. Patients were guided in becoming aware of the differences between healthy and unhealthy coping techniques. Patients were asked to identify 2-3 healthy coping skills they would like to learn to use more effectively.  Therapeutic Goals Patients learned that coping is what human beings do all day long to deal with various situations in their lives Patients defined and discussed healthy vs unhealthy coping techniques Patients identified their preferred coping techniques and identified whether these were healthy or unhealthy Patients determined 2-3 healthy coping skills they would like to become more familiar with and use more often. Patients provided support and ideas to each other   Summary of Patient Progress:   Patient did not attend group despite encouraged participation.    Therapeutic Modalities Cognitive Behavioral Therapy Motivational Interviewing  Larose Kells 01/11/2023  2:39 PM

## 2023-01-11 NOTE — BH IP Treatment Plan (Signed)
Interdisciplinary Treatment and Diagnostic Plan Update  01/11/2023 Time of Session: 08:30 Cory Floyd MRN: [id number]  Principal Diagnosis: Bipolar 1 disorder [facility])  Secondary Diagnoses: Principal Problem:   Bipolar 1 disorder ([place]) Active Problems:   Gastroesophageal reflux disease without esophagitis   Cocaine abuse with cocaine-induced mood disorder (HCC)   Amphetamine abuse ([place])   Cellulitis   Rupture of operation wound   Current Medications:  Current Facility-Administered Medications  Medication Dose Route Frequency Provider Last Rate Last Admin   acetaminophen (TYLENOL) tablet 650 mg  650 mg Oral Q6H PRN [name], [name]   650 mg at 01/04/23 0931   alum & mag hydroxide-simeth (MAALOX/MYLANTA) 200-200-20 MG/5ML suspension 30 mL  30 mL Oral Q4H PRN [name], [name]       ARIPiprazole (ABILIFY) tablet 10 mg  10 mg Oral Daily [name], [name]   10 mg at 01/11/23 [id number]   cloNIDine (CATAPRES) tablet 0.2 mg  0.2 mg Oral Q6H PRN [name], DO       diphenhydrAMINE (BENADRYL) capsule 50 mg  50 mg Oral TID PRN [name], [name]       Or   diphenhydrAMINE (BENADRYL) injection 50 mg  50 mg Intramuscular TID PRN [name], [name]       divalproex (DEPAKOTE ER) 24 hr tablet 1,000 mg  1,000 mg Oral Daily [name], [name]   1,000 mg at 01/11/23 0855   doxycycline (VIBRA-TABS) tablet 100 mg  100 mg Oral Q12H Clapacs, [name], MD   100 mg at 01/11/23 0855   fluticasone (FLONASE) 50 MCG/ACT nasal spray 1 spray  1 spray Each Nare Daily [name], [name]   1 spray at 0000000 [id number]   folic acid (FOLVITE) tablet 1 mg  1 mg Oral Daily [name], [name]   1 mg at 01/11/23 [id number]   haloperidol (HALDOL) tablet 5 mg  5 mg Oral TID PRN [name], [name]       Or   haloperidol lactate (HALDOL) injection 5 mg  5 mg Intramuscular TID PRN [name], [name]       ibuprofen (ADVIL) tablet 600 mg  600 mg Oral Q6H PRN Clapacs, [name], MD        ipratropium (ATROVENT) nebulizer solution 0.5 mg  0.5 mg Nebulization BID PRN Clapacs, [name], MD       LORazepam (ATIVAN) tablet 2 mg  2 mg Oral TID PRN [name], [name]   2 mg at 01/01/23 2104   Or   LORazepam (ATIVAN) injection 2 mg  2 mg Intramuscular TID PRN [name], [name]       losartan (COZAAR) tablet 50 mg  50 mg Oral Daily [name], DO   50 mg at 01/11/23 0855   magnesium hydroxide (MILK OF MAGNESIA) suspension 30 mL  30 mL Oral Daily PRN [name], [name]       metoprolol succinate (TOPROL-XL) 24 hr tablet 50 mg  50 mg Oral Daily [name], DO   50 mg at 01/08/23 1146   mirtazapine (REMERON) tablet 15 mg  15 mg Oral QHS [name], [name]   15 mg at 01/09/23 2124   multivitamin with minerals tablet 1 tablet  1 tablet Oral Daily [name], [name]   1 tablet at 01/11/23 0855   thiamine (VITAMIN B1) tablet 100 mg  100 mg Oral Daily [name], [name]   100 mg at 01/11/23 0855   traZODone (DESYREL) tablet 150 mg  150 mg  Oral QHS PRN [name], DO   150 mg at 01/09/23 2123   PTA Medications: Medications Prior to Admission  Medication Sig Dispense Refill Last Dose   ARIPiprazole (ABILIFY) 10 MG tablet Take 1 tablet (10 mg total) by mouth daily. (Patient not taking: Reported on 12/27/2022) 30 tablet 0 Not Taking   benzonatate (TESSALON) 100 MG capsule Take 1 capsule (100 mg total) by mouth 3 (three) times daily as needed for cough. (Patient not taking: Reported on 12/31/2022) 20 capsule 0 Not Taking   divalproex (DEPAKOTE ER) 500 MG 24 hr tablet Take 2 tablets (1,000 mg total) by mouth at bedtime. (Patient not taking: Reported on 12/27/2022) 60 tablet 0 Not Taking   ipratropium (ATROVENT HFA) 17 MCG/ACT inhaler Inhale 2 puffs into the lungs every 6 (six) hours as needed for wheezing. (Patient not taking: Reported on 12/31/2022) 1 each 12 Not Taking   mirtazapine (REMERON) 15 MG tablet Take 1 tablet (15 mg total) by mouth at  bedtime. For depression/sleep (Patient not taking: Reported on 12/27/2022) 30 tablet 0 Not Taking    Patient Stressors: Financial difficulties   Health problems   Legal issue   Medication change or noncompliance   Substance abuse    Patient Strengths: Ability for insight  Average or above average intelligence  Communication skills   Treatment Modalities: Medication Management, Group therapy, Case management,  1 to 1 session with clinician, Psychoeducation, Recreational therapy.   Physician Treatment Plan for Primary Diagnosis: Bipolar 1 disorder ([place]) Long Term Goal(s): Improvement in symptoms so as ready for discharge   Short Term Goals: Ability to identify changes in lifestyle to reduce recurrence of condition will improve Ability to verbalize feelings will improve Ability to disclose and discuss suicidal ideas Ability to demonstrate self-control will improve Ability to identify and develop effective coping behaviors will improve Ability to maintain clinical measurements within normal limits will improve Compliance with prescribed medications will improve Ability to identify triggers associated with substance abuse/mental health issues will improve  Medication Management: Evaluate patient's response, side effects, and tolerance of medication regimen.  Therapeutic Interventions: 1 to 1 sessions, Unit Group sessions and Medication administration.  Evaluation of Outcomes: Progressing  Physician Treatment Plan for Secondary Diagnosis: Principal Problem:   Bipolar 1 disorder ([place]) Active Problems:   Gastroesophageal reflux disease without esophagitis   Cocaine abuse with cocaine-induced mood disorder (HCC)   Amphetamine abuse (HCC)   Cellulitis   Rupture of operation wound  Long Term Goal(s): Improvement in symptoms so as ready for discharge   Short Term Goals: Ability to identify changes in lifestyle to reduce recurrence of condition will improve Ability to verbalize  feelings will improve Ability to disclose and discuss suicidal ideas Ability to demonstrate self-control will improve Ability to identify and develop effective coping behaviors will improve Ability to maintain clinical measurements within normal limits will improve Compliance with prescribed medications will improve Ability to identify triggers associated with substance abuse/mental health issues will improve     Medication Management: Evaluate patient's response, side effects, and tolerance of medication regimen.  Therapeutic Interventions: 1 to 1 sessions, Unit Group sessions and Medication administration.  Evaluation of Outcomes: Progressing   RN Treatment Plan for Primary Diagnosis: Bipolar 1 disorder ([place]) Long Term Goal(s): Knowledge of disease and therapeutic regimen to maintain health will improve  Short Term Goals: Ability to demonstrate self-control, Ability to participate in decision making will improve, Ability to verbalize feelings will improve, Ability to disclose and discuss suicidal ideas,  Ability to identify and develop effective coping behaviors will improve, and Compliance with prescribed medications will improve  Medication Management: RN will administer medications as ordered by provider, will assess and evaluate patient's response and provide education to patient for prescribed medication. RN will report any adverse and/or side effects to prescribing provider.  Therapeutic Interventions: 1 on 1 counseling sessions, Psychoeducation, Medication administration, Evaluate responses to treatment, Monitor vital signs and CBGs as ordered, Perform/monitor CIWA, COWS, AIMS and Fall Risk screenings as ordered, Perform wound care treatments as ordered.  Evaluation of Outcomes: Progressing   LCSW Treatment Plan for Primary Diagnosis: Bipolar 1 disorder ([place]) Long Term Goal(s): Safe transition to appropriate next level of care at discharge, Engage patient in therapeutic group  addressing interpersonal concerns.  Short Term Goals: Engage patient in aftercare planning with referrals and resources, Increase social support, Increase ability to appropriately verbalize feelings, Increase emotional regulation, Facilitate acceptance of mental health diagnosis and concerns, Facilitate patient progression through stages of change regarding substance use diagnoses and concerns, Identify triggers associated with mental health/substance abuse issues, and Increase skills for wellness and recovery  Therapeutic Interventions: Assess for all discharge needs, 1 to 1 time with Social worker, Explore available resources and support systems, Assess for adequacy in community support network, Educate family and significant other(s) on suicide prevention, Complete Psychosocial Assessment, Interpersonal group therapy.  Evaluation of Outcomes: Progressing   Progress in Treatment: Attending groups: No. Participating in groups: No. Taking medication as prescribed: Yes. Toleration medication: Yes. Family/Significant other contact made: No, will contact:  if given permission. Patient understands diagnosis: Yes. Discussing patient identified problems/goals with staff: Yes. Medical problems stabilized or resolved: Yes. Denies suicidal/homicidal ideation: Yes. Issues/concerns per patient self-inventory: No. Other: none.  New problem(s) identified: No, Describe:  none identified. Update 01/06/2023:  No changes at this time. Update 01/11/23: No changes at this time.   New Short Term/Long Term Goal(s): detox, elimination of symptoms of psychosis, medication management for mood stabilization; elimination of SI thoughts; development of comprehensive mental wellness/sobriety plan. Update 01/06/2023:  No changes at this time. Update 01/11/23: No changes at this time.   Patient Goals:  "Housing, housing, and more housing." Update 01/06/2023:  No changes at this time. Update 01/11/23: No changes at this  time.   Discharge Plan or Barriers: CSW will assist pt with development of an appropriate aftercare/discharge plan. Update 01/06/2023:  Patient remains on the unit at this time looking for placement.  Patient has been given information to contact and speak with [facility].  Patient reports that he is open to this program, however, he does not have the funding for the initial admission fee, $300.  Patient was advised to speak with Director of [facility], however, patient has declined to do so and requested that staff do so.  Follow up is needed. Update 01/11/23: No changes at this time.   Reason for Continuation of Hospitalization: Anxiety Medical Issues Medication stabilization Suicidal ideation Withdrawal symptoms   Estimated Length of Stay: 1-7 days Update 01/06/2023:  TBD Update 01/11/23: No changes at this time.  Last 3 [place] Suicide Severity Risk Score: Flowsheet Row Admission (Current) from 12/30/2022 in [place] ED to Hosp-Admission (Discharged) from 12/26/2022 in [place] [facility]U ED from 12/19/2022 in [facility] Emergency Department at [place] No Risk No Risk No Risk       Last [facility] 2/9 Scores:     No data  to display          Scribe for Treatment Team: Shirl Harris, Marlinda Mike 01/11/2023 9:20 AM

## 2023-01-11 NOTE — Plan of Care (Signed)
  Problem: Education: Goal: Knowledge of General Education information will improve Description: Including pain rating scale, medication(s)/side effects and non-pharmacologic comfort measures Outcome: Progressing   Problem: Nutrition: Goal: Adequate nutrition will be maintained Outcome: Progressing   Problem: Coping: Goal: Level of anxiety will decrease Outcome: Progressing   Problem: Clinical Measurements: Goal: Cardiovascular complication will be avoided Outcome: Not Progressing

## 2023-01-11 NOTE — Progress Notes (Signed)
Patient sleeping this shift. Would not wake up for meds. Continues to endorse depression. Isolative to room.  \Encouragement and support provided. Safety checks maintained. Medications given as prescribed. Pt receptive and remains safe on unit with q 15 min checks.

## 2023-01-11 NOTE — [facility] Group Notes (Signed)
[facility] Group Notes:  (Nursing/MHT/Case Management/Adjunct)  Date:  01/11/2023  Time:  8:32 PM  Type of Therapy:   Wrap up  Participation Level:  Active  Participation Quality:  Redirectable and speaking out of turn was not his turn to speaking.  Affect:  Appropriate  Cognitive:  Alert  Insight:  Good  Engagement in Group:  Engaged and no goal said he would have goal tomorrow.  Modes of Intervention:  Support  Summary of Progress/Problems:  Cory Floyd 01/11/2023, 8:32 PM

## 2023-01-11 NOTE — Progress Notes (Signed)
[facility] MD Progress Note  01/11/2023 1:41 PM Cory Floyd  MRN:  [id number] Subjective: No change at all in presentation.  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   Anginal pain ([place])    Arthritis    Bipolar 1 disorder ([place])    COVID-19    Depressed    Diabetes mellitus without complication ([place])    Dyspnea    Fatty liver    Hypertension    Schizophrenia ([place])     Past Surgical History:  Procedure Laterality Date   COLONOSCOPY  02/05/2014   diverticulosis, hyperplastic polyp x 2   fracture arm Right    INCISION AND DRAINAGE ABSCESS Bilateral 12/29/2022   Procedure: INCISION AND DRAINAGE ABSCESS BILATERAL ARMS;  Surgeon: Shona Needles, MD;  Location: [place];  Service: Orthopedics;  Laterality: Bilateral;   KNEE SURGERY Right 2013   NASAL SEPTUM SURGERY     NASAL TURBINATE REDUCTION Bilateral 12/12/2016   Procedure: TURBINATE REDUCTION/SUBMUCOSAL RESECTION;  Surgeon: Beverly Gust, MD;  Location: [facility];  Service: ENT;  Laterality: Bilateral;   Family History:  Family History  Problem Relation Age of Onset   Hepatitis C Mother    Cancer Mother    Cancer Maternal Aunt    Hypertension Maternal Grandmother    Family Psychiatric  History: See previous Social History:  Social History   Substance and Sexual Activity  Alcohol Use Yes   Comment: 1/2-5th pint of liquor     Social History   Substance and Sexual Activity  Drug Use Yes   Types: Cocaine, Marijuana   Comment: heroin    Social  History   Socioeconomic History   Marital status: Divorced    Spouse name: Not on file   Number of children: Not on file   Years of education: Not on file   Highest education level: Not on file  Occupational History   Not on file  Tobacco Use   Smoking status: Every Day    Packs/day: 1.00    Years: 20.00    Additional pack years: 0.00    Total pack years: 20.00    Types: Cigarettes   Smokeless tobacco: Never  Vaping Use   Vaping Use: Never used  Substance and Sexual Activity   Alcohol use: Yes    Comment: 1/2-5th pint of liquor   Drug use: Yes    Types: Cocaine, Marijuana    Comment: heroin   Sexual activity: Yes    Birth control/protection: None  Other Topics Concern   Not on file  Social History Narrative   Not on file   Social Determinants of Health   Financial Resource Strain: Not on file  Food Insecurity: Food Insecurity Present (12/30/2022)   Hunger Vital Sign    Worried About Running Out of Food in the Last Year: Often true    Ran Out of Food in the Last Year: Often true  Transportation Needs: Unmet Transportation Needs (12/30/2022)   PRAPARE - Hydrologist (Medical): Yes  Lack of Transportation (Non-Medical): Yes  Physical Activity: Not on file  Stress: Not on file  Social Connections: Not on file   Additional Social History:                         Sleep: Fair  Appetite:  Fair  Current Medications: Current Facility-Administered Medications  Medication Dose Route Frequency Provider Last Rate Last Admin   acetaminophen (TYLENOL) tablet 650 mg  650 mg Oral Q6H PRN [name], [name]   650 mg at 01/04/23 0931   alum & mag hydroxide-simeth (MAALOX/MYLANTA) 200-200-20 MG/5ML suspension 30 mL  30 mL Oral Q4H PRN [name], [name]       ARIPiprazole (ABILIFY) tablet 10 mg  10 mg Oral Daily [name], [name]   10 mg at 01/11/23 0855   cloNIDine (CATAPRES) tablet 0.2 mg  0.2 mg Oral Q6H PRN [name], DO       diphenhydrAMINE (BENADRYL) capsule 50 mg  50 mg Oral TID PRN [name], [name]       Or   diphenhydrAMINE (BENADRYL) injection 50 mg  50 mg Intramuscular TID PRN [name], [name]       divalproex (DEPAKOTE ER) 24 hr tablet 1,000 mg  1,000 mg Oral Daily [name], [name]   1,000 mg at 01/11/23 0855   doxycycline (VIBRA-TABS) tablet 100 mg  100 mg Oral Q12H Breeze Angell T, MD   100 mg at 01/11/23 0855   fluticasone (FLONASE) 50 MCG/ACT nasal spray 1 spray  1 spray Each Nare Daily [name], [name]   1 spray at 0000000 [id number]   folic acid (FOLVITE) tablet 1 mg  1 mg Oral Daily [name], [name]   1 mg at 01/11/23 [id number]   haloperidol (HALDOL) tablet 5 mg  5 mg Oral TID PRN [name], [name]       Or   haloperidol lactate (HALDOL) injection 5 mg  5 mg Intramuscular TID PRN [name], [name]       ibuprofen (ADVIL) tablet 600 mg  600 mg Oral Q6H PRN Arben Packman T, MD       ipratropium (ATROVENT) nebulizer solution 0.5 mg  0.5 mg Nebulization BID PRN Rithy Mandley, [name], MD       LORazepam (ATIVAN) tablet 2 mg  2 mg Oral TID PRN [name], [name]   2 mg at 01/01/23 2104   Or   LORazepam (ATIVAN) injection 2 mg  2 mg Intramuscular TID PRN [name], [name]       losartan (COZAAR) tablet 50 mg  50 mg Oral Daily [name], DO   50 mg at 01/11/23 0855   magnesium hydroxide (MILK OF MAGNESIA) suspension 30 mL  30 mL Oral Daily PRN [name], [name]       metoprolol succinate (TOPROL-XL) 24 hr tablet 50 mg  50 mg Oral Daily [name], DO   50 mg at 01/11/23 1152   mirtazapine (REMERON) tablet 15 mg  15 mg Oral QHS [name], [name]   15 mg at 01/09/23 2124   multivitamin with minerals tablet 1 tablet  1 tablet Oral Daily [name], [name]   1 tablet at 01/11/23 [id number]   thiamine (VITAMIN B1) tablet 100 mg  100 mg Oral Daily [name], [name]   100 mg at 01/11/23 0855   traZODone (DESYREL) tablet 150 mg   150 mg Oral QHS PRN [name], DO   150 mg  at 01/09/23 2123    Lab Results: No results found for this or any previous visit (from the past 48 hour(s)).  Blood Alcohol level:  Lab Results  Component Value Date   ETH <10 12/19/2022   ETH <10 [id number]    Metabolic Disorder Labs: Lab Results  Component Value Date   HGBA1C 5.8 (H) 12/31/2022   MPG 120 12/31/2022   MPG 111 04/02/2021   No results found for: "PROLACTIN" Lab Results  Component Value Date   CHOL 149 12/31/2022   TRIG 160 (H) 12/31/2022   HDL 26 (L) 12/31/2022   CHOLHDL 5.7 12/31/2022   VLDL 32 12/31/2022   LDLCALC 91 12/31/2022   LDLCALC 122 (H) 05/04/2021    Physical Findings: AIMS: Facial and Oral Movements Muscles of Facial Expression: None, normal Lips and Perioral Area: None, normal Jaw: None, normal Tongue: None, normal,Extremity Movements Upper (arms, wrists, hands, fingers): None, normal Lower (legs, knees, ankles, toes): None, normal, Trunk Movements Neck, shoulders, hips: None, normal, Overall Severity Severity of abnormal movements (highest score from questions above): None, normal Incapacitation due to abnormal movements: None, normal Patient's awareness of abnormal movements (rate only patient's report): No Awareness, Dental Status Current problems with teeth and/or dentures?: No Does patient usually wear dentures?: No  CIWA:    COWS:     Musculoskeletal: Strength & Muscle Tone: within normal limits Gait & Station: normal Patient leans: N/A  Psychiatric Specialty Exam:  Presentation  General Appearance:  Appropriate for Environment  Eye Contact: Good  Speech: Clear and Coherent  Speech Volume: Normal  Handedness: -- (unknown)   Mood and Affect  Mood: Depressed  Affect: Congruent   Thought Process  Thought Processes: Coherent; Goal Directed; Linear  Descriptions of Associations:Intact  Orientation:Full (Time, Place and Person)  Thought  Content:Logical  History of Schizophrenia/Schizoaffective disorder:No data recorded Duration of Psychotic Symptoms:No data recorded Hallucinations:No data recorded Ideas of Reference:None  Suicidal Thoughts:No data recorded Homicidal Thoughts:No data recorded  Sensorium  Memory: Immediate Fair; Recent Fair  Judgment: Fair  Insight: Good   Executive Functions  Concentration: Good  Attention Span: Good  Recall: Good  Fund of Knowledge: Good  Language: Good   Psychomotor Activity  Psychomotor Activity:No data recorded  Assets  Assets: Communication Skills; Leisure Time; Resilience   Sleep  Sleep:No data recorded   Physical Exam: Physical Exam Vitals and nursing note reviewed.  Constitutional:      Appearance: Normal appearance.  HENT:     Head: Normocephalic and atraumatic.     Mouth/Throat:     Pharynx: Oropharynx is clear.  Eyes:     Pupils: Pupils are equal, round, and reactive to light.  Cardiovascular:     Rate and Rhythm: Normal rate and regular rhythm.  Pulmonary:     Effort: Pulmonary effort is normal.     Breath sounds: Normal breath sounds.  Abdominal:     General: Abdomen is flat.     Palpations: Abdomen is soft.  Musculoskeletal:        General: Normal range of motion.  Skin:    General: Skin is warm and dry.  Neurological:     General: No focal deficit present.     Mental Status: He is alert. Mental status is at baseline.  Psychiatric:        Mood and Affect: Mood normal.        Thought Content: Thought content normal.    Review of Systems  Constitutional: Negative.   HENT: Negative.  Eyes: Negative.   Respiratory: Negative.    Cardiovascular: Negative.   Gastrointestinal: Negative.   Musculoskeletal: Negative.   Skin: Negative.   Neurological: Negative.   Psychiatric/Behavioral: Negative.     Blood pressure 137/77, pulse (!) 101, temperature 98.2 F (36.8 C), temperature source Oral, resp. rate 18, height 5'  10" (1.778 m), weight 97.5 kg, SpO2 97 %. Body mass index is 30.85 kg/m.   Treatment Plan Summary: Medication management and Plan no change to treatment plan.  Still waiting on discharge planning  Alethia Berthold, MD 01/11/2023, 1:41 PM

## 2023-01-11 NOTE — Group Note (Signed)
Date:  01/11/2023 Time:  9:45 AM  Group Topic/Focus:  Goals Group:   The focus of this group is to help patients establish daily goals to achieve during treatment and discuss how the patient can incorporate goal setting into their daily lives to aide in recovery. Community Meeting    Participation Level:  Did Not Attend   Adela Lank [facility] 01/11/2023, 9:45 AM

## 2023-01-11 NOTE — Group Note (Signed)
Recreation Therapy Group Note   Group Topic:Relaxation  Group Date: 01/11/2023 Start Time: 1000 End Time: 1050 Facilitators: Vilma Prader, LRT, CTRS Location:  Dayroom  Group Description: PMR (Progressive Muscle Relaxation). LRT asks patients their current level of stress/anxiety from 1-10, with 10 being the highest. LRT educates patients on what PMR is and the benefits that come from it. Patients are asked to sit with their feet flat on the floor while sitting up and all the way back in their chair, if possible. LRT follows prompt that requires the patients to tense and release different muscles in their body and focus on their breathing. During session, lights are off and soft music is being played. At the end of the prompt, LRT asks patients to rank their current levels of stress/anxiety from 1-10, 10 being the highest.   Affect/Mood: N/A   Participation Level: Did not attend    Clinical Observations/Individualized Feedback: Cory Floyd did not attend group due to resting in his room.  Plan: Continue to engage patient in RT group sessions 2-3x/week.   Vilma Prader, LRT, CTRS 01/11/2023 11:25 AM

## 2023-01-11 NOTE — Progress Notes (Signed)
Pt denies SI/HI/AVH and verbally agrees to approach staff if these become apparent or before harming themselves/others. Rates depression 0/10. Rates anxiety 0/10. Rates pain 0/10.  Pt stated he just feels lonely. Pt stays in his room but did come out for lunch and dinner. Scheduled medications administered to pt, per MD orders. RN provided support and encouragement to pt. Q15 min safety checks implemented and continued. Pt is safe on the unit. Plan of care on going and no other concerns expressed at this time.  01/11/23 0855  Psych Admission Type (Psych Patients Only)  Admission Status Voluntary  Psychosocial Assessment  Patient Complaints Loneliness  Eye Contact Fair  Facial Expression Flat  Affect Appropriate to circumstance  Speech Logical/coherent  Interaction Isolative  Motor Activity Slow  Appearance/Hygiene Poor hygiene  Behavior Characteristics Cooperative;Appropriate to situation;Calm  Mood Pleasant  Aggressive Behavior  Effect No apparent injury  Thought Process  Coherency WDL  Content WDL  Delusions None reported or observed  Perception WDL  Hallucination None reported or observed  Judgment Impaired  Confusion None  Danger to Self  Current suicidal ideation? Denies  Danger to Others  Danger to Others None reported or observed

## 2023-01-11 NOTE — Plan of Care (Signed)
?  Problem: Coping: ?Goal: Level of anxiety will decrease ?Outcome: Progressing ?  ?Problem: Safety: ?Goal: Ability to remain free from injury will improve ?Outcome: Progressing ?  ?

## 2023-01-12 NOTE — Group Note (Signed)
Recreation Therapy Group Note   Group Topic:Self-Esteem  Group Date: 01/12/2023 Start Time: 1000 End Time: 1105 Facilitators: Vilma Prader, LRT, CTRS Location:  Craft Room  Group Description: Patients and LRT discussed the importance of self-love and self-esteem. Pt completed a worksheet that helps them identify 24 different strengths and qualities about themselves. Pt encouraged to read aloud at least 3 off their sheet to the group. LRT and pts discussed how this can be applied to daily life post-discharge.  Pt's then played "Positive Affirmation Bingo" afterwards, with journals or stress balls as bingo prizes.   Affect/Mood: N/A   Participation Level: Did not attend    Clinical Observations/Individualized Feedback: Cory Floyd did not attend group due to resting in his room.  Plan: Continue to engage patient in RT group sessions 2-3x/week.   Vilma Prader, LRT, CTRS 01/12/2023 12:00 PM

## 2023-01-12 NOTE — Progress Notes (Signed)
[facility] MD Progress Note  01/12/2023 5:46 PM Cory Floyd  MRN:  [id number] Subjective: Patient seen and chart reviewed.  No change to behavior problem.  Continues to stay mostly withdrawn.  When approach pleasant and interactive with no complaints. 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   Anginal pain ([place])    Arthritis    Bipolar 1 disorder ([place])    COVID-19    Depressed    Diabetes mellitus without complication ([place])    Dyspnea    Fatty liver    Hypertension    Schizophrenia ([place])     Past Surgical History:  Procedure Laterality Date   COLONOSCOPY  02/05/2014   diverticulosis, hyperplastic polyp x 2   fracture arm Right    INCISION AND DRAINAGE ABSCESS Bilateral 12/29/2022   Procedure: INCISION AND DRAINAGE ABSCESS BILATERAL ARMS;  Surgeon: Shona Needles, MD;  Location: [facility];  Service: Orthopedics;  Laterality: Bilateral;   KNEE SURGERY Right 2013   NASAL SEPTUM SURGERY     NASAL TURBINATE REDUCTION Bilateral 12/12/2016   Procedure: TURBINATE REDUCTION/SUBMUCOSAL RESECTION;  Surgeon: Beverly Gust, MD;  Location: [facility];  Service: ENT;  Laterality: Bilateral;   Family History:  Family History  Problem Relation Age of Onset   Hepatitis C Mother    Cancer Mother    Cancer Maternal Aunt    Hypertension Maternal Grandmother    Family Psychiatric  History: See previous Social History:  Social History   Substance and Sexual Activity  Alcohol Use Yes   Comment: 1/2-5th pint  of liquor     Social History   Substance and Sexual Activity  Drug Use Yes   Types: Cocaine, Marijuana   Comment: heroin    Social History   Socioeconomic History   Marital status: Divorced    Spouse name: Not on file   Number of children: Not on file   Years of education: Not on file   Highest education level: Not on file  Occupational History   Not on file  Tobacco Use   Smoking status: Every Day    Packs/day: 1.00    Years: 20.00    Additional pack years: 0.00    Total pack years: 20.00    Types: Cigarettes   Smokeless tobacco: Never  Vaping Use   Vaping Use: Never used  Substance and Sexual Activity   Alcohol use: Yes    Comment: 1/2-5th pint of liquor   Drug use: Yes    Types: Cocaine, Marijuana    Comment: heroin   Sexual activity: Yes    Birth control/protection: None  Other Topics Concern   Not on file  Social History Narrative   Not on file   Social Determinants of Health   Financial Resource Strain: Not on file  Food Insecurity: Food Insecurity Present (12/30/2022)   Hunger Vital Sign    Worried About Running Out of Food in the Last Year: Often true    Ran Out of Food in the Last Year: Often true  Transportation Needs: Unmet Transportation Needs (12/30/2022)   PRAPARE - Hydrologist (Medical): Yes    Lack of Transportation (Non-Medical): Yes  Physical Activity: Not on file  Stress: Not on file  Social Connections: Not on file   Additional Social History:                         Sleep: Fair  Appetite:  Fair  Current Medications: Current Facility-Administered Medications  Medication Dose Route Frequency Provider Last Rate Last Admin   acetaminophen (TYLENOL) tablet 650 mg  650 mg Oral Q6H PRN [name], [name]   650 mg at 01/04/23 0931   alum & mag hydroxide-simeth (MAALOX/MYLANTA) 200-200-20 MG/5ML suspension 30 mL  30 mL Oral Q4H PRN [name], [name]       ARIPiprazole (ABILIFY) tablet 10 mg   10 mg Oral Daily [name], [name]   10 mg at 01/12/23 [id number]   cloNIDine (CATAPRES) tablet 0.2 mg  0.2 mg Oral Q6H PRN [name], DO       diphenhydrAMINE (BENADRYL) capsule 50 mg  50 mg Oral TID PRN [name], [name]       Or   diphenhydrAMINE (BENADRYL) injection 50 mg  50 mg Intramuscular TID PRN [name], [name]       divalproex (DEPAKOTE ER) 24 hr tablet 1,000 mg  1,000 mg Oral Daily [name], [name]   1,000 mg at 01/12/23 0815   doxycycline (VIBRA-TABS) tablet 100 mg  100 mg Oral Q12H Shields Pautz T, MD   100 mg at 01/12/23 0815   fluticasone (FLONASE) 50 MCG/ACT nasal spray 1 spray  1 spray Each Nare Daily [name], [name]   1 spray at [id number]   folic acid (FOLVITE) tablet 1 mg  1 mg Oral Daily [name], [name]   1 mg at 01/12/23 [id number]   haloperidol (HALDOL) tablet 5 mg  5 mg Oral TID PRN [name], [name]       Or   haloperidol lactate (HALDOL) injection 5 mg  5 mg Intramuscular TID PRN [name], [name]       ibuprofen (ADVIL) tablet 600 mg  600 mg Oral Q6H PRN Janmarie Smoot T, MD       ipratropium (ATROVENT) nebulizer solution 0.5 mg  0.5 mg Nebulization BID PRN Dezi Brauner, [name], MD       LORazepam (ATIVAN) tablet 2 mg  2 mg Oral TID PRN [name], [name]   2 mg at 01/01/23 2104   Or   LORazepam (ATIVAN) injection 2 mg  2 mg Intramuscular TID PRN [name], [name]       losartan (COZAAR) tablet 50 mg  50 mg Oral Daily [name], DO   50 mg at 01/12/23 0815   magnesium hydroxide (MILK OF MAGNESIA) suspension 30 mL  30 mL Oral Daily PRN [name], [name]       metoprolol succinate (TOPROL-XL) 24 hr tablet 50 mg  50 mg Oral Daily [name], DO   50 mg at 01/12/23 0816   mirtazapine (REMERON) tablet 15 mg  15 mg Oral QHS [name], [name]   15 mg at 01/11/23 2340   multivitamin with minerals tablet 1 tablet  1 tablet Oral Daily [name], [name]   1 tablet at 01/12/23 [id number]    thiamine (VITAMIN B1) tablet 100 mg  100 mg Oral Daily [name], [name]   100 mg at 01/12/23  0815   traZODone (DESYREL) tablet 150 mg  150 mg Oral QHS PRN [name], DO   150 mg at 01/11/23 2340    Lab Results: No results found for this or any previous visit (from the past 48 hour(s)).  Blood Alcohol level:  Lab Results  Component Value Date   ETH <10 12/19/2022   ETH <10 [id number]    Metabolic Disorder Labs: Lab Results  Component Value Date   HGBA1C 5.8 (H) 12/31/2022   MPG 120 12/31/2022   MPG 111 04/02/2021   No results found for: "PROLACTIN" Lab Results  Component Value Date   CHOL 149 12/31/2022   TRIG 160 (H) 12/31/2022   HDL 26 (L) 12/31/2022   CHOLHDL 5.7 12/31/2022   VLDL 32 12/31/2022   LDLCALC 91 12/31/2022   LDLCALC 122 (H) 05/04/2021    Physical Findings: AIMS: Facial and Oral Movements Muscles of Facial Expression: None, normal Lips and Perioral Area: None, normal Jaw: None, normal Tongue: None, normal,Extremity Movements Upper (arms, wrists, hands, fingers): None, normal Lower (legs, knees, ankles, toes): None, normal, Trunk Movements Neck, shoulders, hips: None, normal, Overall Severity Severity of abnormal movements (highest score from questions above): None, normal Incapacitation due to abnormal movements: None, normal Patient's awareness of abnormal movements (rate only patient's report): No Awareness, Dental Status Current problems with teeth and/or dentures?: No Does patient usually wear dentures?: No  CIWA:    COWS:     Musculoskeletal: Strength & Muscle Tone: within normal limits Gait & Station: normal Patient leans: N/A  Psychiatric Specialty Exam:  Presentation  General Appearance:  Appropriate for Environment  Eye Contact: Good  Speech: Clear and Coherent  Speech Volume: Normal  Handedness: -- (unknown)   Mood and Affect  Mood: Depressed  Affect: Congruent   Thought Process  Thought  Processes: Coherent; Goal Directed; Linear  Descriptions of Associations:Intact  Orientation:Full (Time, Place and Person)  Thought Content:Logical  History of Schizophrenia/Schizoaffective disorder:No data recorded Duration of Psychotic Symptoms:No data recorded Hallucinations:No data recorded Ideas of Reference:None  Suicidal Thoughts:No data recorded Homicidal Thoughts:No data recorded  Sensorium  Memory: Immediate Fair; Recent Fair  Judgment: Fair  Insight: Good   Executive Functions  Concentration: Good  Attention Span: Good  Recall: Good  Fund of Knowledge: Good  Language: Good   Psychomotor Activity  Psychomotor Activity:No data recorded  Assets  Assets: Communication Skills; Leisure Time; Resilience   Sleep  Sleep:No data recorded   Physical Exam: Physical Exam Vitals and nursing note reviewed.  Constitutional:      Appearance: Normal appearance.  HENT:     Head: Normocephalic and atraumatic.     Mouth/Throat:     Pharynx: Oropharynx is clear.  Eyes:     Pupils: Pupils are equal, round, and reactive to light.  Cardiovascular:     Rate and Rhythm: Normal rate and regular rhythm.  Pulmonary:     Effort: Pulmonary effort is normal.     Breath sounds: Normal breath sounds.  Abdominal:     General: Abdomen is flat.     Palpations: Abdomen is soft.  Musculoskeletal:        General: Normal range of motion.  Skin:    General: Skin is warm and dry.     Comments: Anna cubital fossa wound much improved no longer draining or open sore.  Neurological:     General: No focal deficit present.     Mental Status: He is alert. Mental status is at baseline.  Psychiatric:  Attention and Perception: Attention normal.        Mood and Affect: Mood normal.        Speech: Speech normal.        Behavior: Behavior normal.        Thought Content: Thought content normal.        Cognition and Memory: Cognition normal.    Review of Systems   Constitutional: Negative.   HENT: Negative.    Eyes: Negative.   Respiratory: Negative.    Cardiovascular: Negative.   Gastrointestinal: Negative.   Musculoskeletal: Negative.   Skin: Negative.   Neurological: Negative.   Psychiatric/Behavioral: Negative.     Blood pressure (!) 112/102, pulse 80, temperature 97.9 F (36.6 C), temperature source Oral, resp. rate 18, height 5\' 10"  (1.778 m), weight 97.5 kg, SpO2 100 %. Body mass index is 30.85 kg/m.   Treatment Plan Summary: Plan no change in medication.  We are basically just waiting until he has enough money that we can discharge him  Alethia Berthold, MD 01/12/2023, 5:46 PM

## 2023-01-12 NOTE — Plan of Care (Signed)
  Problem: Education: Goal: Knowledge of General Education information will improve Description: Including pain rating scale, medication(s)/side effects and non-pharmacologic comfort measures Outcome: Progressing   Problem: Nutrition: Goal: Adequate nutrition will be maintained Outcome: Progressing   Problem: Coping: Goal: Level of anxiety will decrease Outcome: Progressing   Problem: Elimination: Goal: Will not experience complications related to bowel motility Outcome: Progressing   Problem: Activity: Goal: Risk for activity intolerance will decrease Outcome: Not Progressing   

## 2023-01-12 NOTE — Progress Notes (Signed)
Pt denies SI/HI/AVH and verbally agrees to approach staff if these become apparent or before harming themselves/others. Rates depression 0/10. Rates anxiety 0/10. Rates pain 0/10.  Pt has been in room all day. Scheduled medications administered to pt, per MD orders. RN provided support and encouragement to pt. Q15 min safety checks implemented and continued. Pt is safe on the unit. Plan of care on going and no other concerns expressed at this time.  01/12/23 0816  Psych Admission Type (Psych Patients Only)  Admission Status Voluntary  Psychosocial Assessment  Patient Complaints Insomnia  Eye Contact Fair  Facial Expression Flat  Affect Appropriate to circumstance  Speech Logical/coherent  Interaction Assertive  Motor Activity Slow  Appearance/Hygiene In scrubs;Poor hygiene;Disheveled  Behavior Characteristics Cooperative;Appropriate to situation;Calm  Mood Pleasant  Aggressive Behavior  Effect No apparent injury  Thought Process  Coherency WDL  Content WDL  Delusions None reported or observed  Perception WDL  Hallucination None reported or observed  Judgment Impaired  Confusion None  Danger to Self  Current suicidal ideation? Denies  Danger to Others  Danger to Others None reported or observed

## 2023-01-12 NOTE — Progress Notes (Signed)
Patient was med compliant with Remeron and Doxycycline.  Pt requested Trazodone to assist with sleep.

## 2023-01-12 NOTE — Progress Notes (Signed)
   01/12/23 2200  Psych Admission Type (Psych Patients Only)  Admission Status Voluntary  Psychosocial Assessment  Patient Complaints Insomnia  Eye Contact Fair  Facial Expression Flat  Affect Appropriate to circumstance  Speech Logical/coherent  Interaction Assertive  Motor Activity Slow  Appearance/Hygiene In scrubs;Disheveled  Behavior Characteristics Cooperative  Mood Pleasant  Thought Process  Coherency WDL  Content WDL  Delusions None reported or observed  Perception WDL  Hallucination None reported or observed  Judgment Impaired  Confusion None  Danger to Self  Current suicidal ideation? Denies  Danger to Others  Danger to Others None reported or observed

## 2023-01-12 NOTE — Group Note (Signed)
Date:  01/12/2023 Time:  6:48 PM  Group Topic/Focus:  Goals Group:   The focus of this group is to help patients establish daily goals to achieve during treatment and discuss how the patient can incorporate goal setting into their daily lives to aide in recovery.  Community Group  Participation Level:  Did Not Attend  Nikki Dom 01/12/2023, 6:48 PM

## 2023-01-13 MED ORDER — QUETIAPINE FUMARATE 200 MG PO TABS
200.0000 mg | ORAL_TABLET | Freq: Every day | ORAL | Status: DC
  Filled 2023-01-13 (×3): qty 1

## 2023-01-13 NOTE — Group Note (Signed)
Date:  01/13/2023 Time:  3:47 PM  Group Topic/Focus:  Music Therapy, Outside court yard     Participation Level:  Did Not Attend   Ladona Mow 01/13/2023, 3:47 PM

## 2023-01-13 NOTE — Progress Notes (Signed)
   01/13/23 1656  Provider Notification  Provider Name/Title Louis Meckel, DO  Date Provider Notified 01/13/23  Time Provider Notified 1724  Method of Notification Page (secure chat)  Notification Reason Red med refusal

## 2023-01-13 NOTE — Progress Notes (Signed)
[facility] MD Progress Note  01/13/2023 1:49 PM Cory Floyd  MRN:  [id number] Subjective: Cory Floyd is seen on rounds.  He states his arm is getting better.  He has no complaints.  Nurses report no issues. Principal Problem: Bipolar 1 disorder (HCC) Diagnosis: Principal Problem:   Bipolar 1 disorder (HCC) Active Problems:   Gastroesophageal reflux disease without esophagitis   Cocaine abuse with cocaine-induced mood disorder (HCC)   Amphetamine abuse (HCC)   Cellulitis   Rupture of operation wound  Total Time spent with patient: 15 minutes  Past Psychiatric History: Amphetamine induced psychosis.  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   Anginal pain ([place])    Arthritis    Bipolar 1 disorder ([place])    COVID-19    Depressed    Diabetes mellitus without complication ([place])    Dyspnea    Fatty liver    Hypertension    Schizophrenia ([place])     Past Surgical History:  Procedure Laterality Date   COLONOSCOPY  02/05/2014   diverticulosis, hyperplastic polyp x 2   fracture arm Right    INCISION AND DRAINAGE ABSCESS Bilateral 12/29/2022   Procedure: INCISION AND DRAINAGE ABSCESS BILATERAL ARMS;  Surgeon: Shona Needles, MD;  Location: [facility];  Service: Orthopedics;  Laterality: Bilateral;   KNEE SURGERY Right 2013   NASAL SEPTUM SURGERY     NASAL TURBINATE REDUCTION Bilateral 12/12/2016   Procedure: TURBINATE REDUCTION/SUBMUCOSAL RESECTION;  Surgeon: Beverly Gust, MD;  Location: [facility];  Service: ENT;  Laterality: Bilateral;   Family History:  Family History  Problem Relation Age of Onset   Hepatitis C Mother    Cancer Mother    Cancer Maternal Aunt    Hypertension Maternal Grandmother    Family Psychiatric  History: Unremarkable Social History:  Social History   Substance and Sexual Activity  Alcohol Use Yes   Comment: 1/2-5th pint of liquor     Social History   Substance and Sexual Activity  Drug Use Yes    Types: Cocaine, Marijuana   Comment: heroin    Social History   Socioeconomic History   Marital status: Divorced    Spouse name: Not on file   Number of children: Not on file   Years of education: Not on file   Highest education level: Not on file  Occupational History   Not on file  Tobacco Use   Smoking status: Every Day    Packs/day: 1.00    Years: 20.00    Additional pack years: 0.00    Total pack years: 20.00    Types: Cigarettes   Smokeless tobacco: Never  Vaping Use   Vaping Use: Never used  Substance and Sexual Activity   Alcohol use: Yes    Comment: 1/2-5th pint of liquor   Drug use: Yes    Types: Cocaine, Marijuana    Comment: heroin   Sexual activity: Yes    Birth control/protection: None  Other Topics Concern   Not on file  Social History Narrative   Not on file   Social Determinants of Health   Financial Resource Strain: Not on file  Food Insecurity: Food Insecurity Present (12/30/2022)   Hunger Vital Sign    Worried About Running Out of Food in the Last Year: Often true    Ran Out of Food in the Last Year: Often true  Transportation Needs: Unmet Transportation Needs (12/30/2022)   PRAPARE -  Hydrologist (Medical): Yes    Lack of Transportation (Non-Medical): Yes  Physical Activity: Not on file  Stress: Not on file  Social Connections: Not on file   Additional Social History:                         Sleep: Good  Appetite:  Good  Current Medications: Current Facility-Administered Medications  Medication Dose Route Frequency Provider Last Rate Last Admin   acetaminophen (TYLENOL) tablet 650 mg  650 mg Oral Q6H PRN [name], [name]   650 mg at 01/04/23 0931   alum & mag hydroxide-simeth (MAALOX/MYLANTA) 200-200-20 MG/5ML suspension 30 mL  30 mL Oral Q4H PRN [name], [name]       ARIPiprazole (ABILIFY) tablet 10 mg  10 mg Oral Daily [name], [name]   10 mg at 01/12/23 [id number]   cloNIDine  (CATAPRES) tablet 0.2 mg  0.2 mg Oral Q6H PRN [name], DO       diphenhydrAMINE (BENADRYL) capsule 50 mg  50 mg Oral TID PRN [name], [name]       Or   diphenhydrAMINE (BENADRYL) injection 50 mg  50 mg Intramuscular TID PRN [name], [name]       divalproex (DEPAKOTE ER) 24 hr tablet 1,000 mg  1,000 mg Oral Daily [name], [name]   1,000 mg at 01/12/23 0815   doxycycline (VIBRA-TABS) tablet 100 mg  100 mg Oral Q12H Clapacs, [name], MD   100 mg at 01/12/23 2243   fluticasone (FLONASE) 50 MCG/ACT nasal spray 1 spray  1 spray Each Nare Daily [name], [name]   1 spray at 0000000 [id number]   folic acid (FOLVITE) tablet 1 mg  1 mg Oral Daily [name], [name]   1 mg at 01/12/23 [id number]   haloperidol (HALDOL) tablet 5 mg  5 mg Oral TID PRN [name], [name]       Or   haloperidol lactate (HALDOL) injection 5 mg  5 mg Intramuscular TID PRN [name], [name]       ibuprofen (ADVIL) tablet 600 mg  600 mg Oral Q6H PRN Clapacs, [name], MD       ipratropium (ATROVENT) nebulizer solution 0.5 mg  0.5 mg Nebulization BID PRN Clapacs, [name], MD       LORazepam (ATIVAN) tablet 2 mg  2 mg Oral TID PRN [name], [name]   2 mg at 01/01/23 2104   Or   LORazepam (ATIVAN) injection 2 mg  2 mg Intramuscular TID PRN [name], [name]       losartan (COZAAR) tablet 50 mg  50 mg Oral Daily [name], DO   50 mg at 01/12/23 0815   magnesium hydroxide (MILK OF MAGNESIA) suspension 30 mL  30 mL Oral Daily PRN [name], [name]       metoprolol succinate (TOPROL-XL) 24 hr tablet 50 mg  50 mg Oral Daily [name], DO   50 mg at 01/12/23 0816   mirtazapine (REMERON) tablet 15 mg  15 mg Oral QHS [name], [name]   15 mg at 01/12/23 2245   multivitamin with minerals tablet 1 tablet  1 tablet Oral Daily [name], [name]   1 tablet at 01/12/23 0816   thiamine (VITAMIN B1) tablet 100 mg  100 mg Oral Daily [name], [name]    100 mg at 01/12/23 0815   traZODone (DESYREL) tablet 150 mg  150  mg Oral QHS PRN [name], DO   150 mg at 01/12/23 2244    Lab Results: No results found for this or any previous visit (from the past 48 hour(s)).  Blood Alcohol level:  Lab Results  Component Value Date   ETH <10 12/19/2022   ETH <10 [id number]    Metabolic Disorder Labs: Lab Results  Component Value Date   HGBA1C 5.8 (H) 12/31/2022   MPG 120 12/31/2022   MPG 111 04/02/2021   No results found for: "PROLACTIN" Lab Results  Component Value Date   CHOL 149 12/31/2022   TRIG 160 (H) 12/31/2022   HDL 26 (L) 12/31/2022   CHOLHDL 5.7 12/31/2022   VLDL 32 12/31/2022   LDLCALC 91 12/31/2022   LDLCALC 122 (H) 05/04/2021    Physical Findings: AIMS: Facial and Oral Movements Muscles of Facial Expression: None, normal Lips and Perioral Area: None, normal Jaw: None, normal Tongue: None, normal,Extremity Movements Upper (arms, wrists, hands, fingers): None, normal Lower (legs, knees, ankles, toes): None, normal, Trunk Movements Neck, shoulders, hips: None, normal, Overall Severity Severity of abnormal movements (highest score from questions above): None, normal Incapacitation due to abnormal movements: None, normal Patient's awareness of abnormal movements (rate only patient's report): No Awareness, Dental Status Current problems with teeth and/or dentures?: No Does patient usually wear dentures?: No  CIWA:    COWS:     Musculoskeletal: Strength & Muscle Tone: within normal limits Gait & Station: normal Patient leans: N/A  Psychiatric Specialty Exam:  Presentation  General Appearance:  Appropriate for Environment  Eye Contact: Good  Speech: Clear and Coherent  Speech Volume: Normal  Handedness: -- (unknown)   Mood and Affect  Mood: Depressed  Affect: Congruent   Thought Process  Thought Processes: Coherent; Goal Directed; Linear  Descriptions of  Associations:Intact  Orientation:Full (Time, Place and Person)  Thought Content:Logical  History of Schizophrenia/Schizoaffective disorder:No data recorded Duration of Psychotic Symptoms:No data recorded Hallucinations:No data recorded Ideas of Reference:None  Suicidal Thoughts:No data recorded Homicidal Thoughts:No data recorded  Sensorium  Memory: Immediate Fair; Recent Fair  Judgment: Fair  Insight: Good   Executive Functions  Concentration: Good  Attention Span: Good  Recall: Good  Fund of Knowledge: Good  Language: Good   Psychomotor Activity  Psychomotor Activity:No data recorded  Assets  Assets: Communication Skills; Leisure Time; Resilience   Sleep  Sleep:No data recorded    Blood pressure (!) 112/102, pulse 80, temperature 97.9 F (36.6 C), temperature source Oral, resp. rate 18, height 5\' 10"  (1.778 m), weight 97.5 kg, SpO2 100 %. Body mass index is 30.85 kg/m.   Treatment Plan Summary: Daily contact with patient to assess and evaluate symptoms and progress in treatment, Medication management, and Plan continue current medications.  [name], DO 01/13/2023, 1:49 PM

## 2023-01-13 NOTE — Psychosocial Assessment (Signed)
[facility] Group Notes:  (Nursing/MHT/Case Management/Adjunct)  Date:  01/13/2023  Time:  11:45 PM  Type of Therapy:  Group Therapy  Participation Level:  Active  Participation Quality:  Attentive  Affect:  Anxious  Cognitive:  Appropriate  Insight:  Good  Engagement in Group:  Engaged  Modes of Intervention:  Education

## 2023-01-13 NOTE — Group Note (Signed)
Date:  01/13/2023 Time:  3:35 PM  Group Topic/Focus:  Community Group     Participation Level:  Did Not Attend   Cory Floyd 01/13/2023, 3:35 PM

## 2023-01-13 NOTE — Progress Notes (Signed)
Patient stated that he did not want to take his scheduled medications at this time. Patient is lying in bed, facing the window, and stated that he will come up later to take his medication. This Probation officer will try again once patient is fully awake. MD will be notified during rounds.

## 2023-01-13 NOTE — Group Note (Signed)
LCSW Group Therapy Note   Group Date: 01/13/2023 Start Time: 1000 End Time: 1100  Type of Therapy/Topic:  Group Therapy:  Feelings about Diagnosis  Participation Level:  Did Not Attend   Mood: N/A   Description of Group:    This group will allow patients to explore their thoughts and feelings about diagnoses they have received. Patients will be guided to explore their level of understanding and acceptance of these diagnoses. Facilitator will encourage patients to process their thoughts and feelings about the reactions of others to their diagnosis, and will guide patients in identifying ways to discuss their diagnosis with significant others in their lives. This group will be process-oriented, with patients participating in exploration of their own experiences as well as giving and receiving support and challenge from other group members.   Therapeutic Goals: 1. Patient will demonstrate understanding of diagnosis as evidence by identifying two or more symptoms of the disorder:  2. Patient will be able to express two feelings regarding the diagnosis 3. Patient will demonstrate ability to communicate their needs through discussion and/or role plays  Summary of Patient Progress: Patient was invited to group, did not attend.     Therapeutic Modalities:   Cognitive Behavioral Therapy Brief Therapy Feelings Identification    Maretta Los, LCSW

## 2023-01-13 NOTE — Plan of Care (Signed)

## 2023-01-13 NOTE — Progress Notes (Signed)
Patient allowed this writer to obtain a set of vitals on him, but when it was time for him to take his medication he stated, "I know all of that isn't for me". This Probation officer explained that to make his correct dosage, he has multiple tablets. Patient said ok and walked off back to his room. This Probation officer heard patient say, as he was passing the nurses station, "I'm not taking twenty fucking pills a day". MD will be notified.

## 2023-01-14 NOTE — [facility] Group Notes (Signed)
?  ?   ?   ?  Orientation/Goals Group- ?Pt did not attend.  ?  ?  ? ?

## 2023-01-14 NOTE — Group Note (Signed)
Date:  01/14/2023 Time:  3:32 PM  Group Topic/Focus:  Activity Group-Courtyard    Participation Level:  Did Not Attend   Adela Lank [facility] 01/14/2023, 3:32 PM

## 2023-01-14 NOTE — Progress Notes (Signed)
[facility] MD Progress Note  01/14/2023 12:06 PM VINAYAK AWWAD  MRN:  [id number] Subjective: Cory Floyd is seen on rounds.  Nurses report he has been refusing his medications.  I started him on Seroquel last night to see if he would take it since he refused his daytime medications.  It looks like he has not taken it.  He remains in good controls.  Encouraged him to take his medications.  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   Anginal pain ([place])    Arthritis    Bipolar 1 disorder ([place])    COVID-19    Depressed    Diabetes mellitus without complication ([place])    Dyspnea    Fatty liver    Hypertension    Schizophrenia ([place])     Past Surgical History:  Procedure Laterality Date   COLONOSCOPY  02/05/2014   diverticulosis, hyperplastic polyp x 2   fracture arm Right    INCISION AND DRAINAGE ABSCESS Bilateral 12/29/2022   Procedure: INCISION AND DRAINAGE ABSCESS BILATERAL ARMS;  Surgeon: Shona Needles, MD;  Location: [place];  Service: Orthopedics;  Laterality: Bilateral;   KNEE SURGERY Right 2013   NASAL SEPTUM SURGERY     NASAL TURBINATE REDUCTION Bilateral 12/12/2016   Procedure: TURBINATE REDUCTION/SUBMUCOSAL RESECTION;  Surgeon: Beverly Gust, MD;  Location: [facility];  Service: ENT;  Laterality: Bilateral;   Family History:  Family History  Problem Relation Age of Onset   Hepatitis C Mother    Cancer Mother    Cancer Maternal Aunt    Hypertension Maternal Grandmother     Social History:  Social History   Substance and  Sexual Activity  Alcohol Use Yes   Comment: 1/2-5th pint of liquor     Social History   Substance and Sexual Activity  Drug Use Yes   Types: Cocaine, Marijuana   Comment: heroin    Social History   Socioeconomic History   Marital status: Divorced    Spouse name: Not on file   Number of children: Not on file   Years of education: Not on file   Highest education level: Not on file  Occupational History   Not on file  Tobacco Use   Smoking status: Every Day    Packs/day: 1.00    Years: 20.00    Additional pack years: 0.00    Total pack years: 20.00    Types: Cigarettes   Smokeless tobacco: Never  Vaping Use   Vaping Use: Never used  Substance and Sexual Activity   Alcohol use: Yes    Comment: 1/2-5th pint of liquor   Drug use: Yes    Types: Cocaine, Marijuana    Comment: heroin   Sexual activity: Yes    Birth control/protection: None  Other Topics Concern   Not on file  Social History Narrative   Not on file   Social Determinants of Health   Financial Resource Strain: Not on file  Food Insecurity: Food Insecurity Present (12/30/2022)   Hunger Vital Sign    Worried About Running Out  of Food in the Last Year: Often true    Ran Out of Food in the Last Year: Often true  Transportation Needs: Unmet Transportation Needs (12/30/2022)   PRAPARE - Hydrologist (Medical): Yes    Lack of Transportation (Non-Medical): Yes  Physical Activity: Not on file  Stress: Not on file  Social Connections: Not on file   Additional Social History:                         Sleep: Good  Appetite:  Good  Current Medications: Current Facility-Administered Medications  Medication Dose Route Frequency Provider Last Rate Last Admin   acetaminophen (TYLENOL) tablet 650 mg  650 mg Oral Q6H PRN [name], [name]   650 mg at 01/04/23 0931   alum & mag hydroxide-simeth (MAALOX/MYLANTA) 200-200-20 MG/5ML suspension 30 mL  30 mL Oral Q4H PRN  [name], [name]       ARIPiprazole (ABILIFY) tablet 10 mg  10 mg Oral Daily [name], [name]   10 mg at 01/14/23 [id number]   cloNIDine (CATAPRES) tablet 0.2 mg  0.2 mg Oral Q6H PRN [name], DO       diphenhydrAMINE (BENADRYL) capsule 50 mg  50 mg Oral TID PRN [name], [name]       Or   diphenhydrAMINE (BENADRYL) injection 50 mg  50 mg Intramuscular TID PRN [name], [name]       divalproex (DEPAKOTE ER) 24 hr tablet 1,000 mg  1,000 mg Oral Daily [name], [name]   1,000 mg at 01/14/23 [id number]   doxycycline (VIBRA-TABS) tablet 100 mg  100 mg Oral Q12H Clapacs, [name], MD   100 mg at 01/14/23 0823   fluticasone (FLONASE) 50 MCG/ACT nasal spray 1 spray  1 spray Each Nare Daily [name], [name]   1 spray at 0000000 [id number]   folic acid (FOLVITE) tablet 1 mg  1 mg Oral Daily [name], [name]   1 mg at 01/14/23 [id number]   haloperidol (HALDOL) tablet 5 mg  5 mg Oral TID PRN [name], [name]       Or   haloperidol lactate (HALDOL) injection 5 mg  5 mg Intramuscular TID PRN [name], [name]       ibuprofen (ADVIL) tablet 600 mg  600 mg Oral Q6H PRN Clapacs, [name], MD       ipratropium (ATROVENT) nebulizer solution 0.5 mg  0.5 mg Nebulization BID PRN Clapacs, [name], MD       LORazepam (ATIVAN) tablet 2 mg  2 mg Oral TID PRN [name], [name]   2 mg at 01/01/23 2104   Or   LORazepam (ATIVAN) injection 2 mg  2 mg Intramuscular TID PRN [name], [name]       losartan (COZAAR) tablet 50 mg  50 mg Oral Daily [name], DO   50 mg at 01/14/23 [id number]   magnesium hydroxide (MILK OF MAGNESIA) suspension 30 mL  30 mL Oral Daily PRN [name], [name]       metoprolol succinate (TOPROL-XL) 24 hr tablet 50 mg  50 mg Oral Daily [name], DO   50 mg at 01/14/23 [id number]   mirtazapine (REMERON) tablet 15 mg  15 mg Oral QHS [name], [name]   15 mg at 01/12/23 2245   multivitamin with minerals tablet 1 tablet  1  tablet Oral Daily [name], [name]   1 tablet at 01/14/23 [phone]  QUEtiapine (SEROQUEL) tablet 200 mg  200 mg Oral QHS [name], DO       thiamine (VITAMIN B1) tablet 100 mg  100 mg Oral Daily [name], [name]   100 mg at 01/14/23 [id number]   traZODone (DESYREL) tablet 150 mg  150 mg Oral QHS PRN [name], DO   150 mg at 01/12/23 2244    Lab Results: No results found for this or any previous visit (from the past 48 hour(s)).  Blood Alcohol level:  Lab Results  Component Value Date   ETH <10 12/19/2022   ETH <10 [id number]    Metabolic Disorder Labs: Lab Results  Component Value Date   HGBA1C 5.8 (H) 12/31/2022   MPG 120 12/31/2022   MPG 111 04/02/2021   No results found for: "PROLACTIN" Lab Results  Component Value Date   CHOL 149 12/31/2022   TRIG 160 (H) 12/31/2022   HDL 26 (L) 12/31/2022   CHOLHDL 5.7 12/31/2022   VLDL 32 12/31/2022   LDLCALC 91 12/31/2022   LDLCALC 122 (H) 05/04/2021    Physical Findings: AIMS: Facial and Oral Movements Muscles of Facial Expression: None, normal Lips and Perioral Area: None, normal Jaw: None, normal Tongue: None, normal,Extremity Movements Upper (arms, wrists, hands, fingers): None, normal Lower (legs, knees, ankles, toes): None, normal, Trunk Movements Neck, shoulders, hips: None, normal, Overall Severity Severity of abnormal movements (highest score from questions above): None, normal Incapacitation due to abnormal movements: None, normal Patient's awareness of abnormal movements (rate only patient's report): No Awareness, Dental Status Current problems with teeth and/or dentures?: No Does patient usually wear dentures?: No  CIWA:    COWS:     Musculoskeletal: Strength & Muscle Tone: within normal limits Gait & Station: normal Patient leans: N/A  Psychiatric Specialty Exam:  Presentation  General Appearance:  Appropriate for Environment  Eye Contact: Good  Speech: Clear and  Coherent  Speech Volume: Normal  Handedness: -- (unknown)   Mood and Affect  Mood: Depressed  Affect: Congruent   Thought Process  Thought Processes: Coherent; Goal Directed; Linear  Descriptions of Associations:Intact  Orientation:Full (Time, Place and Person)  Thought Content:Logical  History of Schizophrenia/Schizoaffective disorder:No data recorded Duration of Psychotic Symptoms:No data recorded Hallucinations:No data recorded Ideas of Reference:None  Suicidal Thoughts:No data recorded Homicidal Thoughts:No data recorded  Sensorium  Memory: Immediate Fair; Recent Fair  Judgment: Fair  Insight: Good   Executive Functions  Concentration: Good  Attention Span: Good  Recall: Good  Fund of Knowledge: Good  Language: Good   Psychomotor Activity  Psychomotor Activity:No data recorded  Assets  Assets: Communication Skills; Leisure Time; Resilience   Sleep  Sleep:No data recorded   Blood pressure 106/80, pulse 81, temperature 97.8 F (36.6 C), temperature source Oral, resp. rate 18, height 5\' 10"  (1.778 m), weight 97.5 kg, SpO2 98 %. Body mass index is 30.85 kg/m.   Treatment Plan Summary: Daily contact with patient to assess and evaluate symptoms and progress in treatment, Medication management, and Plan continue current medications.  [name], DO 01/14/2023, 12:06 PM

## 2023-01-14 NOTE — Plan of Care (Signed)
D: Patient denies SI/HI/AVH. Patient has no physical complaints at this time  A: Patient was assessed by this nurse. Patient is medication adherent. Q x 15 minute observation checks were completed for safety. Patient was provided with verbal education on provided medications. Patient care plan was reviewed. Patient was offered support and encouragement. Patient was encourage to attend groups, participate in unit activities and continue with plan of care.    R: Patient adheres with scheduled medication. Patient has no complaints of pain at this time. Patient is receptive to treatment and safety maintained on unit.    Problem: Education: Goal: Knowledge of General Education information will improve Description: Including pain rating scale, medication(s)/side effects and non-pharmacologic comfort measures Outcome: Progressing   Problem: Health Behavior/Discharge Planning: Goal: Ability to manage health-related needs will improve Outcome: Progressing   Problem: Clinical Measurements: Goal: Ability to maintain clinical measurements within normal limits will improve Outcome: Progressing

## 2023-01-14 NOTE — Progress Notes (Signed)
RN checked in with pt at medication time and asked if he was coming to the medication room to take his night time medications. Pt refused his night time medications and refused to speak with RN. No additional concerns.      01/13/23 2130  Psych Admission Type (Psych Patients Only)  Admission Status Voluntary  Psychosocial Assessment  Patient Complaints Malaise  Eye Contact Fair  Facial Expression Worried  Affect Preoccupied  Speech Logical/coherent  Interaction Assertive  Motor Activity Shuffling;Slow  Appearance/Hygiene Unremarkable  Behavior Characteristics Resistant to care  Mood Irritable  Thought Process  Coherency Circumstantial  Content Blaming others  Delusions None reported or observed  Perception WDL  Hallucination None reported or observed  Judgment Impaired  Confusion None  Danger to Self  Current suicidal ideation? Denies  Agreement Not to Harm Self Yes  Description of Agreement Verbal  Danger to Others  Danger to Others None reported or observed

## 2023-01-14 NOTE — [facility] Group Notes (Signed)
[place] Group Notes:  (Nursing/MHT/Case Management/Adjunct)  Date:  01/14/2023  Time:  11:34 PM  Type of Therapy:  Group Therapy  Participation Level:  Did Not Attend   [name],Blossie Raffel E 01/14/2023, 11:34 PM

## 2023-01-14 NOTE — B[facility] Group Notes (Signed)
Psychoeducational Group- Patient attended Psychoeducational group on identifying negative behavioral patterns and recognizing symptoms of anxiety.  Poem by psychotherapist Donzetta Kohut '' The owl and the chimpanzee '' was read to help patients identify signs of anxiety, and reflection was used during group. Patient did not attend.

## 2023-01-15 MED ORDER — DIVALPROEX SODIUM ER 500 MG PO TB24
500.0000 mg | ORAL_TABLET | Freq: Two times a day (BID) | ORAL | Status: DC
  Administered 2023-01-17 – 2023-01-22 (×7): 500 mg via ORAL
  Filled 2023-01-15 (×10): qty 1

## 2023-01-15 NOTE — BHH Group Notes (Signed)
[place] Group Notes:  (Nursing/MHT/Case Management/Adjunct)  Date:  01/15/2023  Time:  10:27 PM  Type of Therapy:  Group Therapy  Participation Level:  Active  Participation Quality:  Appropriate and Attentive  Affect:  Appropriate  Cognitive:  Alert  Insight:  Good  Engagement in Group:  Engaged  Modes of Intervention:  Discussion  Summary of Progress/Problems:Wrap up group about the importance of wellness daily checklist in your life.   [name],Jakelyn Squyres E 01/15/2023, 10:27 PM

## 2023-01-15 NOTE — Progress Notes (Signed)
Patient continues to stay in bed and refuses to come up to take his scheduled medication. MD notified during progression rounds.

## 2023-01-15 NOTE — Group Note (Signed)
[facility] LCSW Group Therapy Note    Group Date: 01/15/2023 Start Time: 1300 End Time: 1400  Type of Therapy and Topic:  Group Therapy:  Overcoming Obstacles  Participation Level:  B[facility] PARTICIPATION LEVEL: Did Not Attend   Description of Group:   In this group patients will be encouraged to explore what they see as obstacles to their own wellness and recovery. They will be guided to discuss their thoughts, feelings, and behaviors related to these obstacles. The group will process together ways to cope with barriers, with attention given to specific choices patients can make. Each patient will be challenged to identify changes they are motivated to make in order to overcome their obstacles. This group will be process-oriented, with patients participating in exploration of their own experiences as well as giving and receiving support and challenge from other group members.  Therapeutic Goals: 1. Patient will identify personal and current obstacles as they relate to admission. 2. Patient will identify barriers that currently interfere with their wellness or overcoming obstacles.  3. Patient will identify feelings, thought process and behaviors related to these barriers. 4. Patient will identify two changes they are willing to make to overcome these obstacles:    Summary of Patient Progress X   Therapeutic Modalities:   Cognitive Behavioral Therapy Solution Focused Therapy Motivational Interviewing Relapse Prevention Therapy   Shirl Harris, LCSW

## 2023-01-15 NOTE — Group Note (Signed)
Recreation Therapy Group Note   Group Topic:Emotion Expression  Group Date: 01/15/2023 Start Time: 1000 End Time: 1045 Facilitators: Vilma Prader, LRT, CTRS Location:  Craft Room  Group Description: Gratitude Journaling. Patients and LRT discussed what gratitude means, how we can express it and what it means to [place], personally. LRT gave an education handout on the definition of gratitude that also gave different examples of gratitude exercises that they could try. One of the examples was "Gratitude Letter", which prompted you to write a letter to someone you appreciate. LRT played soft music while everyone wrote their letter. Once letter was completed, LRT encouraged people to read their letter, if they wanted to, or share who they wrote it to, at minimum. LRT and pts processed how showing gratitude towards themselves, and others can be applied to everyday life post-discharge.   Affect/Mood: N/A   Participation Level: Did not attend    Clinical Observations/Individualized Feedback: Cory Floyd did not attend group due to resting in his room.  Plan: Continue to engage patient in RT group sessions 2-3x/week.   Vilma Prader, LRT, CTRS 01/15/2023 11:27 AM

## 2023-01-15 NOTE — Group Note (Signed)
Date:  01/15/2023 Time:  9:41 AM  Group Topic/Focus:  Goals Group:   The focus of this group is to help patients establish daily goals to achieve during treatment and discuss how the patient can incorporate goal setting into their daily lives to aide in recovery.  Community Meeting   Participation Level:  Did Not Attend   Adela Lank [facility] 01/15/2023, 9:41 AM

## 2023-01-15 NOTE — Progress Notes (Signed)
Patient denies SI, HI & AVH. He never came up for medication  and after it was offered x2 on shift. Patient seemed to rest well through out the night.

## 2023-01-15 NOTE — Group Note (Signed)
Date:  01/15/2023 Time:  4:18 PM  Group Topic/Focus:  Activity Group-Courtyard    Participation Level:  Minimal  Participation Quality:  Appropriate  Affect:  Appropriate  Cognitive:  Appropriate  Insight: Appropriate  Engagement in Group:  Engaged  Modes of Intervention:  Activity  Additional Comments:    Dorena Bodo 01/15/2023, 4:18 PM

## 2023-01-15 NOTE — Progress Notes (Signed)
[facility] MD Progress Note  01/15/2023 4:21 PM Cory Floyd  MRN:  [id number] Subjective: Follow-up patient with bipolar disorder substance abuse and homelessness Principal Problem: Bipolar 1 disorder ([place]) Diagnosis: Principal Problem:   Bipolar 1 disorder ([place]) Active Problems:   Gastroesophageal reflux disease without esophagitis   Cocaine abuse with cocaine-induced mood disorder (HCC)   Amphetamine abuse ([place])   Cellulitis   Rupture of operation wound  Total Time spent with patient: 20 minutes  Past Psychiatric History: See above  Past Medical History:  Past Medical History:  Diagnosis Date   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   Anginal pain ([place])    Arthritis    Bipolar 1 disorder ([place])    COVID-19    Depressed    Diabetes mellitus without complication ([place])    Dyspnea    Fatty liver    Hypertension    Schizophrenia ([place])     Past Surgical History:  Procedure Laterality Date   COLONOSCOPY  02/05/2014   diverticulosis, hyperplastic polyp x 2   fracture arm Right    INCISION AND DRAINAGE ABSCESS Bilateral 12/29/2022   Procedure: INCISION AND DRAINAGE ABSCESS BILATERAL ARMS;  Surgeon: Shona Needles, MD;  Location: [facility];  Service: Orthopedics;  Laterality: Bilateral;   KNEE SURGERY Right 2013   NASAL SEPTUM SURGERY     NASAL TURBINATE REDUCTION Bilateral 12/12/2016   Procedure: TURBINATE REDUCTION/SUBMUCOSAL RESECTION;  Surgeon: Beverly Gust, MD;  Location: [facility];  Service: ENT;  Laterality: Bilateral;   Family History:  Family History  Problem Relation Age of Onset   Hepatitis C Mother    Cancer Mother    Cancer Maternal Aunt    Hypertension Maternal Grandmother    Family Psychiatric  History: See above Social History:  Social History   Substance and Sexual Activity  Alcohol Use Yes   Comment: 1/2-5th pint of liquor     Social History   Substance and Sexual Activity  Drug Use Yes   Types: Cocaine, Marijuana   Comment: heroin    Social History    Socioeconomic History   Marital status: Divorced    Spouse name: Not on file   Number of children: Not on file   Years of education: Not on file   Highest education level: Not on file  Occupational History   Not on file  Tobacco Use   Smoking status: Every Day    Packs/day: 1.00    Years: 20.00    Additional pack years: 0.00    Total pack years: 20.00    Types: Cigarettes   Smokeless tobacco: Never  Vaping Use   Vaping Use: Never used  Substance and Sexual Activity   Alcohol use: Yes    Comment: 1/2-5th pint of liquor   Drug use: Yes    Types: Cocaine, Marijuana    Comment: heroin   Sexual activity: Yes    Birth control/protection: None  Other Topics Concern   Not on file  Social History Narrative   Not on file   Social Determinants of Health   Financial Resource Strain: Not on file  Food Insecurity: Food Insecurity Present (12/30/2022)   Hunger Vital Sign    Worried About Running Out of Food in the Last Year: Often true    Ran Out of Food in the Last Year: Often true  Transportation Needs: Unmet Transportation Needs (12/30/2022)   PRAPARE - Hydrologist (Medical): Yes    Lack  of Transportation (Non-Medical): Yes  Physical Activity: Not on file  Stress: Not on file  Social Connections: Not on file   Additional Social History:                         Sleep: Fair  Appetite:  Fair  Current Medications: Current Facility-Administered Medications  Medication Dose Route Frequency Provider Last Rate Last Admin   acetaminophen (TYLENOL) tablet 650 mg  650 mg Oral Q6H PRN [name], [name]   650 mg at 01/04/23 0931   alum & mag hydroxide-simeth (MAALOX/MYLANTA) 200-200-20 MG/5ML suspension 30 mL  30 mL Oral Q4H PRN [name], [name]       ARIPiprazole (ABILIFY) tablet 10 mg  10 mg Oral Daily [name], [name]   10 mg at 01/14/23 [id number]   cloNIDine (CATAPRES) tablet 0.2 mg  0.2 mg Oral Q6H PRN [name],  DO       diphenhydrAMINE (BENADRYL) capsule 50 mg  50 mg Oral TID PRN [name], [name]       Or   diphenhydrAMINE (BENADRYL) injection 50 mg  50 mg Intramuscular TID PRN [name], Excell Seltzer ON 01/16/2023] divalproex (DEPAKOTE ER) 24 hr tablet 500 mg  500 mg Oral BID Jsoeph Podesta, [name], MD       doxycycline (VIBRA-TABS) tablet 100 mg  100 mg Oral Q12H Kalab Camps T, MD   100 mg at 01/14/23 0823   fluticasone (FLONASE) 50 MCG/ACT nasal spray 1 spray  1 spray Each Nare Daily [name], [name]   1 spray at 0000000 [id number]   folic acid (FOLVITE) tablet 1 mg  1 mg Oral Daily [name], [name]   1 mg at 01/14/23 [id number]   haloperidol (HALDOL) tablet 5 mg  5 mg Oral TID PRN [name], [name]       Or   haloperidol lactate (HALDOL) injection 5 mg  5 mg Intramuscular TID PRN [name], [name]       ibuprofen (ADVIL) tablet 600 mg  600 mg Oral Q6H PRN Sharone Picchi T, MD       ipratropium (ATROVENT) nebulizer solution 0.5 mg  0.5 mg Nebulization BID PRN Danel Requena, [name], MD       losartan (COZAAR) tablet 50 mg  50 mg Oral Daily [name], DO   50 mg at 01/14/23 [id number]   magnesium hydroxide (MILK OF MAGNESIA) suspension 30 mL  30 mL Oral Daily PRN [name], [name]       metoprolol succinate (TOPROL-XL) 24 hr tablet 50 mg  50 mg Oral Daily [name], DO   50 mg at 01/14/23 [id number]   mirtazapine (REMERON) tablet 15 mg  15 mg Oral QHS [name], [name]   15 mg at 01/12/23 2245   multivitamin with minerals tablet 1 tablet  1 tablet Oral Daily [name], [name]   1 tablet at 01/14/23 [id number]   QUEtiapine (SEROQUEL) tablet 200 mg  200 mg Oral QHS [name], DO       thiamine (VITAMIN B1) tablet 100 mg  100 mg Oral Daily [name], [name]   100 mg at 01/14/23 [id number]   traZODone (DESYREL) tablet 150 mg  150 mg Oral QHS PRN [name], DO   150 mg at 01/12/23 2244    Lab Results: No results found for this or any  previous visit (from the past 56 hour(s)).  Blood Alcohol level:  Lab Results  Component Value Date   ETH <10 12/19/2022   ETH <10 [id number]    Metabolic Disorder Labs: Lab Results  Component Value Date   HGBA1C 5.8 (H) 12/31/2022   MPG 120 12/31/2022   MPG 111 04/02/2021   No results found for: "PROLACTIN" Lab Results  Component Value Date   CHOL 149 12/31/2022   TRIG 160 (H) 12/31/2022   HDL 26 (L) 12/31/2022   CHOLHDL 5.7 12/31/2022   VLDL 32 12/31/2022   LDLCALC 91 12/31/2022   LDLCALC 122 (H) 05/04/2021    Physical Findings: AIMS: Facial and Oral Movements Muscles of Facial Expression: None, normal Lips and Perioral Area: None, normal Jaw: None, normal Tongue: None, normal,Extremity Movements Upper (arms, wrists, hands, fingers): None, normal Lower (legs, knees, ankles, toes): None, normal, Trunk Movements Neck, shoulders, hips: None, normal, Overall Severity Severity of abnormal movements (highest score from questions above): None, normal Incapacitation due to abnormal movements: None, normal Patient's awareness of abnormal movements (rate only patient's report): No Awareness, Dental Status Current problems with teeth and/or dentures?: No Does patient usually wear dentures?: No  CIWA:    COWS:     Musculoskeletal: Strength & Muscle Tone: within normal limits Gait & Station: normal Patient leans: N/A  Psychiatric Specialty Exam:  Presentation  General Appearance:  Appropriate for Environment  Eye Contact: Good  Speech: Clear and Coherent  Speech Volume: Normal  Handedness: -- (unknown)   Mood and Affect  Mood: Depressed  Affect: Congruent   Thought Process  Thought Processes: Coherent; Goal Directed; Linear  Descriptions of Associations:Intact  Orientation:Full (Time, Place and Person)  Thought Content:Logical  History of Schizophrenia/Schizoaffective disorder:No data recorded Duration of Psychotic Symptoms:No data  recorded Hallucinations:No data recorded Ideas of Reference:None  Suicidal Thoughts:No data recorded Homicidal Thoughts:No data recorded  Sensorium  Memory: Immediate Fair; Recent Fair  Judgment: Fair  Insight: Good   Executive Functions  Concentration: Good  Attention Span: Good  Recall: Good  Fund of Knowledge: Good  Language: Good   Psychomotor Activity  Psychomotor Activity:No data recorded  Assets  Assets: Communication Skills; Leisure Time; Resilience   Sleep  Sleep:No data recorded   Physical Exam: Physical Exam Vitals and nursing note reviewed.  Constitutional:      Appearance: Normal appearance.  HENT:     Head: Normocephalic and atraumatic.     Mouth/Throat:     Pharynx: Oropharynx is clear.  Eyes:     Pupils: Pupils are equal, round, and reactive to light.  Cardiovascular:     Rate and Rhythm: Normal rate and regular rhythm.  Pulmonary:     Effort: Pulmonary effort is normal.     Breath sounds: Normal breath sounds.  Abdominal:     General: Abdomen is flat.     Palpations: Abdomen is soft.  Musculoskeletal:        General: Normal range of motion.  Skin:    General: Skin is warm and dry.  Neurological:     General: No focal deficit present.     Mental Status: He is alert. Mental status is at baseline.  Psychiatric:        Mood and Affect: Mood normal.        Thought Content: Thought content normal.    ROS Blood pressure (!) 95/54, pulse 69, temperature 98.8 F (37.1 C), resp. rate 19, height 5\' 10"  (1.778 m), weight 97.5 kg, SpO2 95 %. Body mass index is 30.85 kg/m.   Treatment Plan Summary: Plan patient  has no new complaints.  Lays around his room not doing very much waiting for his check to come in at the beginning of the month.  Medically stable.  I looked over his medicine at the suggestion of nursing.  He is on some medicines that would be sedating but mostly they are PRNs and he is not taking them.  Other medicines  seem pretty reasonable.  His blood pressure is low today and we will follow up on whether he needs to continue all the blood pressure medicine  Cory Berthold, MD 01/15/2023, 4:21 PM

## 2023-01-15 NOTE — Plan of Care (Signed)
D- Patient alert and oriented. Patient presented in a preoccupied, but fairly pleasant mood on assessment stating that he was going to get up and take a shower and then take his medication. Patient has been asleep majority of the day and has not come up to take medication. Patient had no complaints and stated that he did not need anything from staff at this time. Patient denied SI, HI, AVH, and pain at this time. Patient also denied any signs/symptoms of depression and anxiety at this time. Patient had no stated goals for today.   A- Patient refused medications. Support and encouragement provided.  Routine safety checks conducted every 15 minutes.  Patient informed to notify staff with problems or concerns.  R- Patient contracts for safety at this time. Patient receptive, calm, and cooperative. Patient interacts well with others on the unit. Patient remains safe at this time.  Problem: Education: Goal: Knowledge of General Education information will improve Description: Including pain rating scale, medication(s)/side effects and non-pharmacologic comfort measures Outcome: Not Progressing   Problem: Health Behavior/Discharge Planning: Goal: Ability to manage health-related needs will improve Outcome: Not Progressing   Problem: Clinical Measurements: Goal: Ability to maintain clinical measurements within normal limits will improve Outcome: Not Progressing Goal: Will remain free from infection Outcome: Not Progressing Goal: Diagnostic test results will improve Outcome: Not Progressing Goal: Respiratory complications will improve Outcome: Not Progressing Goal: Cardiovascular complication will be avoided Outcome: Not Progressing   Problem: Activity: Goal: Risk for activity intolerance will decrease Outcome: Not Progressing   Problem: Nutrition: Goal: Adequate nutrition will be maintained Outcome: Not Progressing   Problem: Coping: Goal: Level of anxiety will decrease Outcome: Not  Progressing   Problem: Elimination: Goal: Will not experience complications related to bowel motility Outcome: Not Progressing Goal: Will not experience complications related to urinary retention Outcome: Not Progressing   Problem: Pain Managment: Goal: General experience of comfort will improve Outcome: Not Progressing   Problem: Safety: Goal: Ability to remain free from injury will improve Outcome: Not Progressing   Problem: Skin Integrity: Goal: Risk for impaired skin integrity will decrease Outcome: Not Progressing

## 2023-01-16 NOTE — BH IP Treatment Plan (Signed)
Interdisciplinary Treatment and Diagnostic Plan Update  01/16/2023 Time of Session: 08:30 Cory Floyd MRN: [id number]  Principal Diagnosis: Bipolar 1 disorder ([place])  Secondary Diagnoses: Principal Problem:   Bipolar 1 disorder ([place]) Active Problems:   Gastroesophageal reflux disease without esophagitis   Cocaine abuse with cocaine-induced mood disorder (HCC)   Amphetamine abuse ([place])   Cellulitis   Rupture of operation wound   Current Medications:  Current Facility-Administered Medications  Medication Dose Route Frequency Provider Last Rate Last Admin   acetaminophen (TYLENOL) tablet 650 mg  650 mg Oral Q6H PRN [name], [name]   650 mg at 01/04/23 0931   alum & mag hydroxide-simeth (MAALOX/MYLANTA) 200-200-20 MG/5ML suspension 30 mL  30 mL Oral Q4H PRN [name], [name]       ARIPiprazole (ABILIFY) tablet 10 mg  10 mg Oral Daily [name], [name]   10 mg at 01/14/23 [id number]   cloNIDine (CATAPRES) tablet 0.2 mg  0.2 mg Oral Q6H PRN [name], DO       diphenhydrAMINE (BENADRYL) capsule 50 mg  50 mg Oral TID PRN [name], [name]       Or   diphenhydrAMINE (BENADRYL) injection 50 mg  50 mg Intramuscular TID PRN [name], [name]       divalproex (DEPAKOTE ER) 24 hr tablet 500 mg  500 mg Oral BID Clapacs, [name], MD       doxycycline (VIBRA-TABS) tablet 100 mg  100 mg Oral Q12H Clapacs, [name], MD   100 mg at 01/14/23 0823   fluticasone (FLONASE) 50 MCG/ACT nasal spray 1 spray  1 spray Each Nare Daily [name], [name]   1 spray at 0000000 [id number]   folic acid (FOLVITE) tablet 1 mg  1 mg Oral Daily [name], [name]   1 mg at 01/14/23 [id number]   haloperidol (HALDOL) tablet 5 mg  5 mg Oral TID PRN [name], [name]       Or   haloperidol lactate (HALDOL) injection 5 mg  5 mg Intramuscular TID PRN [name], [name]       ibuprofen (ADVIL) tablet 600 mg  600 mg Oral Q6H PRN Clapacs, [name], MD       ipratropium (ATROVENT) nebulizer  solution 0.5 mg  0.5 mg Nebulization BID PRN Clapacs, [name], MD       losartan (COZAAR) tablet 50 mg  50 mg Oral Daily [name], DO   50 mg at 01/14/23 [id number]   magnesium hydroxide (MILK OF MAGNESIA) suspension 30 mL  30 mL Oral Daily PRN [name], [name]       metoprolol succinate (TOPROL-XL) 24 hr tablet 50 mg  50 mg Oral Daily [name], DO   50 mg at 01/14/23 [id number]   mirtazapine (REMERON) tablet 15 mg  15 mg Oral QHS [name], [name]   15 mg at 01/12/23 2245   multivitamin with minerals tablet 1 tablet  1 tablet Oral Daily [name], [name]   1 tablet at 01/14/23 [id number]   QUEtiapine (SEROQUEL) tablet 200 mg  200 mg Oral QHS [name], DO       thiamine (VITAMIN B1) tablet 100 mg  100 mg Oral Daily [name], [name]   100 mg at 01/14/23 0824   traZODone (DESYREL) tablet 150 mg  150 mg Oral QHS PRN [name], DO   150 mg at 01/12/23 2244   PTA Medications: Medications Prior to Admission  Medication Sig Dispense Refill Last  Dose   ARIPiprazole (ABILIFY) 10 MG tablet Take 1 tablet (10 mg total) by mouth daily. (Patient not taking: Reported on 12/27/2022) 30 tablet 0 Not Taking   benzonatate (TESSALON) 100 MG capsule Take 1 capsule (100 mg total) by mouth 3 (three) times daily as needed for cough. (Patient not taking: Reported on 12/31/2022) 20 capsule 0 Not Taking   divalproex (DEPAKOTE ER) 500 MG 24 hr tablet Take 2 tablets (1,000 mg total) by mouth at bedtime. (Patient not taking: Reported on 12/27/2022) 60 tablet 0 Not Taking   ipratropium (ATROVENT HFA) 17 MCG/ACT inhaler Inhale 2 puffs into the lungs every 6 (six) hours as needed for wheezing. (Patient not taking: Reported on 12/31/2022) 1 each 12 Not Taking   mirtazapine (REMERON) 15 MG tablet Take 1 tablet (15 mg total) by mouth at bedtime. For depression/sleep (Patient not taking: Reported on 12/27/2022) 30 tablet 0 Not Taking    Patient Stressors: Financial difficulties    Health problems   Legal issue   Medication change or noncompliance   Substance abuse    Patient Strengths: Ability for insight  Average or above average intelligence  Communication skills   Treatment Modalities: Medication Management, Group therapy, Case management,  1 to 1 session with clinician, Psychoeducation, Recreational therapy.   Physician Treatment Plan for Primary Diagnosis: Bipolar 1 disorder ([place]) Long Term Goal(s): Improvement in symptoms so as ready for discharge   Short Term Goals: Ability to identify changes in lifestyle to reduce recurrence of condition will improve Ability to verbalize feelings will improve Ability to disclose and discuss suicidal ideas Ability to demonstrate self-control will improve Ability to identify and develop effective coping behaviors will improve Ability to maintain clinical measurements within normal limits will improve Compliance with prescribed medications will improve Ability to identify triggers associated with substance abuse/mental health issues will improve  Medication Management: Evaluate patient's response, side effects, and tolerance of medication regimen.  Therapeutic Interventions: 1 to 1 sessions, Unit Group sessions and Medication administration.  Evaluation of Outcomes: Progressing  Physician Treatment Plan for Secondary Diagnosis: Principal Problem:   Bipolar 1 disorder ([place]) Active Problems:   Gastroesophageal reflux disease without esophagitis   Cocaine abuse with cocaine-induced mood disorder (HCC)   Amphetamine abuse (HCC)   Cellulitis   Rupture of operation wound  Long Term Goal(s): Improvement in symptoms so as ready for discharge   Short Term Goals: Ability to identify changes in lifestyle to reduce recurrence of condition will improve Ability to verbalize feelings will improve Ability to disclose and discuss suicidal ideas Ability to demonstrate self-control will improve Ability to identify and  develop effective coping behaviors will improve Ability to maintain clinical measurements within normal limits will improve Compliance with prescribed medications will improve Ability to identify triggers associated with substance abuse/mental health issues will improve     Medication Management: Evaluate patient's response, side effects, and tolerance of medication regimen.  Therapeutic Interventions: 1 to 1 sessions, Unit Group sessions and Medication administration.  Evaluation of Outcomes: Progressing   RN Treatment Plan for Primary Diagnosis: Bipolar 1 disorder ([place]) Long Term Goal(s): Knowledge of disease and therapeutic regimen to maintain health will improve  Short Term Goals: Ability to remain free from injury will improve, Ability to verbalize frustration and anger appropriately will improve, Ability to demonstrate self-control, Ability to participate in decision making will improve, Ability to verbalize feelings will improve, Ability to disclose and discuss suicidal ideas, Ability to identify and develop effective coping behaviors will improve, and  Compliance with prescribed medications will improve  Medication Management: RN will administer medications as ordered by provider, will assess and evaluate patient's response and provide education to patient for prescribed medication. RN will report any adverse and/or side effects to prescribing provider.  Therapeutic Interventions: 1 on 1 counseling sessions, Psychoeducation, Medication administration, Evaluate responses to treatment, Monitor vital signs and CBGs as ordered, Perform/monitor CIWA, COWS, AIMS and Fall Risk screenings as ordered, Perform wound care treatments as ordered.  Evaluation of Outcomes: Progressing   LCSW Treatment Plan for Primary Diagnosis: Bipolar 1 disorder ([place]) Long Term Goal(s): Safe transition to appropriate next level of care at discharge, Engage patient in therapeutic group addressing interpersonal  concerns.  Short Term Goals: Engage patient in aftercare planning with referrals and resources, Increase social support, Increase ability to appropriately verbalize feelings, Increase emotional regulation, Facilitate acceptance of mental health diagnosis and concerns, Facilitate patient progression through stages of change regarding substance use diagnoses and concerns, Identify triggers associated with mental health/substance abuse issues, and Increase skills for wellness and recovery  Therapeutic Interventions: Assess for all discharge needs, 1 to 1 time with Social worker, Explore available resources and support systems, Assess for adequacy in community support network, Educate family and significant other(s) on suicide prevention, Complete Psychosocial Assessment, Interpersonal group therapy.  Evaluation of Outcomes: Progressing   Progress in Treatment: Attending groups: No. Participating in groups: No. Taking medication as prescribed: Yes. Toleration medication: Yes. Family/Significant other contact made: No, will contact:  if given permission. Patient understands diagnosis: Yes. Discussing patient identified problems/goals with staff: Yes. Medical problems stabilized or resolved: Yes. Denies suicidal/homicidal ideation: Yes. Issues/concerns per patient self-inventory: No. Other: none.  New problem(s) identified: No, Describe:  none identified. Update 01/06/2023:  No changes at this time. Update 01/11/23: No changes at this time. Update 01/16/23: No changes at this time.    New Short Term/Long Term Goal(s): detox, elimination of symptoms of psychosis, medication management for mood stabilization; elimination of SI thoughts; development of comprehensive mental wellness/sobriety plan. Update 01/06/2023:  No changes at this time. Update 01/11/23: No changes at this time. Update 01/16/23: No changes at this time.   Patient Goals:  "Housing, housing, and more housing." Update 01/06/2023:  No  changes at this time. Update 01/11/23: No changes at this time. Update 01/16/23: No changes at this time.   Discharge Plan or Barriers: CSW will assist pt with development of an appropriate aftercare/discharge plan. Update 01/06/2023:  Patient remains on the unit at this time looking for placement.  Patient has been given information to contact and speak with [facility].  Patient reports that he is open to this program, however, he does not have the funding for the initial admission fee, $300.  Patient was advised to speak with Director of [facility], however, patient has declined to do so and requested that staff do so.  Follow up is needed. Update 01/11/23: No changes at this time. Update 01/16/23: No changes at this time.   Reason for Continuation of Hospitalization: Anxiety Medical Issues Medication stabilization Suicidal ideation Withdrawal symptoms   Estimated Length of Stay: 1-7 days Update 01/06/2023:  TBD Update 01/11/23: No changes at this time. Update 01/16/23: No changes at this time.  Last 3 [place] Suicide Severity Risk Score: Flowsheet Row Admission (Current) from 12/30/2022 in [facility] ED to Hosp-Admission (Discharged) from 12/26/2022 in [facility] ED from [facility]19/2022 in [facility] Department at [facility]  C-SSRS RISK CATEGORY No Risk No Risk No Risk       Last PHQ 2/9 Scores:     No data to display          Scribe for Treatment Team: Shirl Harris, LCSW 01/16/2023 9:17 AM

## 2023-01-16 NOTE — Progress Notes (Signed)
Pt was generally isolative to bedroom and declined all HS medications, stating "I don't take seroquel, I'm sorry."  Pt was adamant that he did not want to take any of his medications when offered at HS.  Pt was otherwise calm and denied any complaints.   01/16/23 0000  Psych Admission Type (Psych Patients Only)  Admission Status Voluntary  Psychosocial Assessment  Patient Complaints None  Eye Contact Brief  Facial Expression Sullen  Affect Preoccupied  Speech Logical/coherent  Interaction Isolative  Motor Activity Slow  Appearance/Hygiene Poor hygiene  Behavior Characteristics Resistant to care  Mood Preoccupied  Thought Process  Coherency WDL  Content WDL  Delusions WDL  Perception WDL  Hallucination None reported or observed  Judgment Impaired  Confusion None  Danger to Self  Current suicidal ideation? Denies  Danger to Others  Danger to Others None reported or observed

## 2023-01-16 NOTE — Plan of Care (Signed)
D- Patient alert and oriented. Patient presented in a pleasant mood on assessment reporting that he slept "poor" last night, which is probably because he stays in bed and sleeps all day. Patient had no complaints to voice to this Probation officer. Patient denied SI, HI, AVH, and pain at this time. Patient also denied depression and anxiety, although he endorsed them on his self-inventory. Patient reported that he filled out the paperwork the night before and no longer had these feelings. Patient had no stated goals for today.  A- Patient refuses to take any of his scheduled medication. Support and encouragement provided. Routine safety checks conducted every 15 minutes. Patient informed to notify staff with problems or concerns.  R- Patient contracts for safety at this time. Patient receptive and calm with this Probation officer. Patient interacts well with others on the unit. Patient remains safe at this time.  Problem: Education: Goal: Knowledge of General Education information will improve Description: Including pain rating scale, medication(s)/side effects and non-pharmacologic comfort measures Outcome: Not Progressing   Problem: Health Behavior/Discharge Planning: Goal: Ability to manage health-related needs will improve Outcome: Not Progressing   Problem: Clinical Measurements: Goal: Ability to maintain clinical measurements within normal limits will improve Outcome: Not Progressing Goal: Will remain free from infection Outcome: Not Progressing Goal: Diagnostic test results will improve Outcome: Not Progressing Goal: Respiratory complications will improve Outcome: Not Progressing Goal: Cardiovascular complication will be avoided Outcome: Not Progressing   Problem: Activity: Goal: Risk for activity intolerance will decrease Outcome: Not Progressing   Problem: Nutrition: Goal: Adequate nutrition will be maintained Outcome: Not Progressing   Problem: Coping: Goal: Level of anxiety will  decrease Outcome: Not Progressing   Problem: Elimination: Goal: Will not experience complications related to bowel motility Outcome: Not Progressing Goal: Will not experience complications related to urinary retention Outcome: Not Progressing   Problem: Pain Managment: Goal: General experience of comfort will improve Outcome: Not Progressing   Problem: Safety: Goal: Ability to remain free from injury will improve Outcome: Not Progressing   Problem: Skin Integrity: Goal: Risk for impaired skin integrity will decrease Outcome: Not Progressing

## 2023-01-16 NOTE — Group Note (Signed)
Recreation Therapy Group Note   Group Topic:Coping Skills  Group Date: 01/16/2023 Start Time: 1000 End Time: 1050 Facilitators: Vilma Prader, LRT, CTRS Location:  Craft Room  Group Description: Mind Map.  Patient was provided a blank template of a diagram with 32 blank boxes in a tiered system, branching from the center (similar to a bubble chart). LRT directed patients to label the middle of the diagram "Coping Skills". LRT and patients then came up with 8 different coping skills as examples. Pt were directed to record their coping skills in the 2nd tier boxes closest to the center.  Patients would then share their coping skills with the group as LRT wrote them out. LRT gave a handout of 100 different coping skills at the end of group.   Affect/Mood: N/A   Participation Level: Did not attend    Clinical Observations/Individualized Feedback: Cory Floyd did not attend group due to resting in his room.  Plan: Continue to engage patient in RT group sessions 2-3x/week.   Vilma Prader, LRT, CTRS 01/16/2023 11:28 AM

## 2023-01-16 NOTE — Progress Notes (Signed)
[facility] MD Progress Note  01/16/2023 3:22 PM Cory Floyd  MRN:  [id number] Subjective: Patient seen and chart reviewed.  47 year old man with bipolar disorder and cocaine abuse.  He came to talk to me about his medicine today.  Wants to stop the Seroquel.  Looking back at his chart it seems that he was not taking that a couple years ago we restarted it when he was having so many complaints about sleeping.  Now he is sleeping fine at night.  Mood is stable no psychotic symptoms. 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   Anginal pain ([place])    Arthritis    Bipolar 1 disorder ([place])    COVID-19    Depressed    Diabetes mellitus without complication ([place])    Dyspnea    Fatty liver    Hypertension    Schizophrenia ([place])     Past Surgical History:  Procedure Laterality Date   COLONOSCOPY  02/05/2014   diverticulosis, hyperplastic polyp x 2   fracture arm Right    INCISION AND DRAINAGE ABSCESS Bilateral 12/29/2022   Procedure: INCISION AND DRAINAGE ABSCESS BILATERAL ARMS;  Surgeon: Shona Needles, MD;  Location: [place];  Service: Orthopedics;  Laterality: Bilateral;   KNEE SURGERY Right 2013   NASAL SEPTUM SURGERY     NASAL TURBINATE REDUCTION Bilateral 12/12/2016   Procedure: TURBINATE REDUCTION/SUBMUCOSAL RESECTION;  Surgeon: Beverly Gust, MD;  Location: [facility];  Service: ENT;  Laterality: Bilateral;   Family History:  Family History  Problem Relation Age of Onset   Hepatitis C Mother    Cancer Mother     Cancer Maternal Aunt    Hypertension Maternal Grandmother    Family Psychiatric  History: See previous Social History:  Social History   Substance and Sexual Activity  Alcohol Use Yes   Comment: 1/2-5th pint of liquor     Social History   Substance and Sexual Activity  Drug Use Yes   Types: Cocaine, Marijuana   Comment: heroin    Social History   Socioeconomic History   Marital status: Divorced    Spouse name: Not on file   Number of children: Not on file   Years of education: Not on file   Highest education level: Not on file  Occupational History   Not on file  Tobacco Use   Smoking status: Every Day    Packs/day: 1.00    Years: 20.00    Additional pack years: 0.00    Total pack years: 20.00    Types: Cigarettes   Smokeless tobacco: Never  Vaping Use   Vaping Use: Never used  Substance and Sexual Activity   Alcohol use: Yes    Comment: 1/2-5th pint of liquor   Drug use: Yes    Types: Cocaine, Marijuana    Comment: heroin   Sexual activity: Yes    Birth control/protection: None  Other Topics Concern   Not on file  Social History Narrative   Not on file   Social Determinants of  Health   Financial Resource Strain: Not on file  Food Insecurity: Food Insecurity Present (12/30/2022)   Hunger Vital Sign    Worried About Running Out of Food in the Last Year: Often true    Ran Out of Food in the Last Year: Often true  Transportation Needs: Unmet Transportation Needs (12/30/2022)   PRAPARE - Hydrologist (Medical): Yes    Lack of Transportation (Non-Medical): Yes  Physical Activity: Not on file  Stress: Not on file  Social Connections: Not on file   Additional Social History:                         Sleep: Fair  Appetite:  Fair  Current Medications: Current Facility-Administered Medications  Medication Dose Route Frequency Provider Last Rate Last Admin   acetaminophen (TYLENOL) tablet 650 mg  650 mg Oral Q6H PRN  [name], [name]   650 mg at 01/04/23 0931   alum & mag hydroxide-simeth (MAALOX/MYLANTA) 200-200-20 MG/5ML suspension 30 mL  30 mL Oral Q4H PRN [name], [name]       ARIPiprazole (ABILIFY) tablet 10 mg  10 mg Oral Daily [name], [name]   10 mg at 01/14/23 [id number]   cloNIDine (CATAPRES) tablet 0.2 mg  0.2 mg Oral Q6H PRN [name], DO       diphenhydrAMINE (BENADRYL) capsule 50 mg  50 mg Oral TID PRN [name], [name]       Or   diphenhydrAMINE (BENADRYL) injection 50 mg  50 mg Intramuscular TID PRN [name], [name]       divalproex (DEPAKOTE ER) 24 hr tablet 500 mg  500 mg Oral BID Ranetta Armacost, [name], MD       fluticasone (FLONASE) 50 MCG/ACT nasal spray 1 spray  1 spray Each Nare Daily [name], [name]   1 spray at 0000000 [id number]   folic acid (FOLVITE) tablet 1 mg  1 mg Oral Daily [name], [name]   1 mg at 01/14/23 [id number]   haloperidol (HALDOL) tablet 5 mg  5 mg Oral TID PRN [name], [name]       Or   haloperidol lactate (HALDOL) injection 5 mg  5 mg Intramuscular TID PRN [name], [name]       ibuprofen (ADVIL) tablet 600 mg  600 mg Oral Q6H PRN Guliana Weyandt T, MD       ipratropium (ATROVENT) nebulizer solution 0.5 mg  0.5 mg Nebulization BID PRN Eligah Anello, [name], MD       losartan (COZAAR) tablet 50 mg  50 mg Oral Daily [name], DO   50 mg at 01/14/23 [id number]   magnesium hydroxide (MILK OF MAGNESIA) suspension 30 mL  30 mL Oral Daily PRN [name], [name]       metoprolol succinate (TOPROL-XL) 24 hr tablet 50 mg  50 mg Oral Daily [name], DO   50 mg at 01/14/23 [id number]   mirtazapine (REMERON) tablet 15 mg  15 mg Oral QHS [name], [name]   15 mg at 01/12/23 2245   multivitamin with minerals tablet 1 tablet  1 tablet Oral Daily [name], [name]   1 tablet at 01/14/23 [id number]   thiamine (VITAMIN B1) tablet 100 mg  100 mg Oral Daily [name], [name]   100 mg at 01/14/23 0824   traZODone  (DESYREL) tablet 150 mg  150 mg Oral QHS PRN [name], DO   150  mg at 01/12/23 2244    Lab Results: No results found for this or any previous visit (from the past 48 hour(s)).  Blood Alcohol level:  Lab Results  Component Value Date   ETH <10 12/19/2022   ETH <10 [id number]    Metabolic Disorder Labs: Lab Results  Component Value Date   HGBA1C 5.8 (H) 12/31/2022   MPG 120 12/31/2022   MPG 111 04/02/2021   No results found for: "PROLACTIN" Lab Results  Component Value Date   CHOL 149 12/31/2022   TRIG 160 (H) 12/31/2022   HDL 26 (L) 12/31/2022   CHOLHDL 5.7 12/31/2022   VLDL 32 12/31/2022   LDLCALC 91 12/31/2022   LDLCALC 122 (H) 05/04/2021    Physical Findings: AIMS: Facial and Oral Movements Muscles of Facial Expression: None, normal Lips and Perioral Area: None, normal Jaw: None, normal Tongue: None, normal,Extremity Movements Upper (arms, wrists, hands, fingers): None, normal Lower (legs, knees, ankles, toes): None, normal, Trunk Movements Neck, shoulders, hips: None, normal, Overall Severity Severity of abnormal movements (highest score from questions above): None, normal Incapacitation due to abnormal movements: None, normal Patient's awareness of abnormal movements (rate only patient's report): No Awareness, Dental Status Current problems with teeth and/or dentures?: No Does patient usually wear dentures?: No  CIWA:    COWS:     Musculoskeletal: Strength & Muscle Tone: within normal limits Gait & Station: normal Patient leans: N/A  Psychiatric Specialty Exam:  Presentation  General Appearance:  Appropriate for Environment  Eye Contact: Good  Speech: Clear and Coherent  Speech Volume: Normal  Handedness: -- (unknown)   Mood and Affect  Mood: Depressed  Affect: Congruent   Thought Process  Thought Processes: Coherent; Goal Directed; Linear  Descriptions of Associations:Intact  Orientation:Full (Time, Place and  Person)  Thought Content:Logical  History of Schizophrenia/Schizoaffective disorder:No data recorded Duration of Psychotic Symptoms:No data recorded Hallucinations:No data recorded Ideas of Reference:None  Suicidal Thoughts:No data recorded Homicidal Thoughts:No data recorded  Sensorium  Memory: Immediate Fair; Recent Fair  Judgment: Fair  Insight: Good   Executive Functions  Concentration: Good  Attention Span: Good  Recall: Good  Fund of Knowledge: Good  Language: Good   Psychomotor Activity  Psychomotor Activity:No data recorded  Assets  Assets: Communication Skills; Leisure Time; Resilience   Sleep  Sleep:No data recorded   Physical Exam: Physical Exam Vitals and nursing note reviewed.  Constitutional:      Appearance: Normal appearance.  HENT:     Head: Normocephalic and atraumatic.     Mouth/Throat:     Pharynx: Oropharynx is clear.  Eyes:     Pupils: Pupils are equal, round, and reactive to light.  Cardiovascular:     Rate and Rhythm: Normal rate and regular rhythm.  Pulmonary:     Effort: Pulmonary effort is normal.     Breath sounds: Normal breath sounds.  Abdominal:     General: Abdomen is flat.     Palpations: Abdomen is soft.  Musculoskeletal:        General: Normal range of motion.  Skin:    General: Skin is warm and dry.  Neurological:     General: No focal deficit present.     Mental Status: He is alert. Mental status is at baseline.  Psychiatric:        Attention and Perception: Attention normal. He is attentive.        Mood and Affect: Mood normal.        Speech: Speech normal.  Behavior: Behavior is cooperative.        Thought Content: Thought content normal.        Cognition and Memory: Cognition normal.    Review of Systems  Constitutional: Negative.   HENT: Negative.    Eyes: Negative.   Respiratory: Negative.    Cardiovascular: Negative.   Gastrointestinal: Negative.   Musculoskeletal: Negative.    Skin: Negative.   Neurological: Negative.   Psychiatric/Behavioral: Negative.     Blood pressure 125/67, pulse 86, temperature 97.9 F (36.6 C), temperature source Oral, resp. rate 20, height 5\' 10"  (1.778 m), weight 97.5 kg, SpO2 96 %. Body mass index is 30.85 kg/m.   Treatment Plan Summary: Plan discontinue Seroquel.  Reviewed other meds with patient.  Likely discharge by the first week of April.  Alethia Berthold, MD 01/16/2023, 3:22 PM

## 2023-01-16 NOTE — Group Note (Signed)
[facility] LCSW Group Therapy Note    Group Date: 01/16/2023 Start Time: 1300 End Time: 1400  Type of Therapy and Topic:  Group Therapy:  Overcoming Obstacles  Participation Level:  [facility] PARTICIPATION LEVEL: Did Not Attend  Mood:  Description of Group:   In this group patients will be encouraged to explore what they see as obstacles to their own wellness and recovery. They will be guided to discuss their thoughts, feelings, and behaviors related to these obstacles. The group will process together ways to cope with barriers, with attention given to specific choices patients can make. Each patient will be challenged to identify changes they are motivated to make in order to overcome their obstacles. This group will be process-oriented, with patients participating in exploration of their own experiences as well as giving and receiving support and challenge from other group members.  Therapeutic Goals: 1. Patient will identify personal and current obstacles as they relate to admission. 2. Patient will identify barriers that currently interfere with their wellness or overcoming obstacles.  3. Patient will identify feelings, thought process and behaviors related to these barriers. 4. Patient will identify two changes they are willing to make to overcome these obstacles:    Summary of Patient Progress Patient did not attend group despite encouraged participation.     Therapeutic Modalities:   Cognitive Behavioral Therapy Solution Focused Therapy Motivational Interviewing Relapse Prevention Therapy   [name], [place]

## 2023-01-16 NOTE — Progress Notes (Signed)
   01/16/23 0831  Provider Notification  Provider Name/Title Clapacs, MD  Date Provider Notified 01/16/23  Time Provider Notified 0831  Method of Notification Face-to-face  Notification Reason Red med refusal

## 2023-01-16 NOTE — Progress Notes (Signed)
Patient continues to refuse medication, stating "not right now".

## 2023-01-16 NOTE — Group Note (Signed)
Date:  01/16/2023 Time:  10:15 AM  Group Topic/Focus:  Goals Group:   The focus of this group is to help patients establish daily goals to achieve during treatment and discuss how the patient can incorporate goal setting into their daily lives to aide in recovery.  Community Meeting / Managing Stress   Participation Level:  Did Not Attend  Cory Floyd A [name] 01/16/2023, 10:15 AM

## 2023-01-17 NOTE — Progress Notes (Signed)
Pt denies SI/HI/AVH and verbally agrees to approach staff if these become apparent or before harming themselves/others. Rates depression 0/10. Rates anxiety 0/10. Rates pain 0/10. Pt has been advocating for other pts. Pt has been out of his room more today. Pt stated that he will take his medications but not new ones. Pt interacts with more pts. Scheduled medications administered to pt, per MD orders. RN provided support and encouragement to pt. Q15 min safety checks implemented and continued. Pt is safe on the unit. Plan of care on going and no other concerns expressed at this time.  01/17/23 0929  Psych Admission Type (Psych Patients Only)  Admission Status Voluntary  Psychosocial Assessment  Patient Complaints None  Eye Contact Brief  Facial Expression Other (Comment) (WDL)  Affect Appropriate to circumstance  Speech Logical/coherent  Interaction Assertive  Motor Activity Slow  Appearance/Hygiene Disheveled  Behavior Characteristics Cooperative;Appropriate to situation;Calm  Mood Pleasant  Aggressive Behavior  Effect No apparent injury  Thought Process  Coherency WDL  Content WDL  Delusions None reported or observed  Perception WDL  Hallucination None reported or observed  Judgment Impaired  Confusion None  Danger to Self  Current suicidal ideation? Denies  Danger to Others  Danger to Others None reported or observed

## 2023-01-17 NOTE — Progress Notes (Signed)
[facility] MD Progress Note  01/17/2023 4:07 PM Cory Floyd  MRN:  [id number] Subjective: No new complaints.  Comes out of his room watches TV he hangs around with others.  No dangerous behavior. 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   Anginal pain ([place])    Arthritis    Bipolar 1 disorder ([place])    COVID-19    Depressed    Diabetes mellitus without complication ([place])    Dyspnea    Fatty liver    Hypertension    Schizophrenia ([place])     Past Surgical History:  Procedure Laterality Date   COLONOSCOPY  02/05/2014   diverticulosis, hyperplastic polyp x 2   fracture arm Right    INCISION AND DRAINAGE ABSCESS Bilateral 12/29/2022   Procedure: INCISION AND DRAINAGE ABSCESS BILATERAL ARMS;  Surgeon: Shona Needles, MD;  Location: [place];  Service: Orthopedics;  Laterality: Bilateral;   KNEE SURGERY Right 2013   NASAL SEPTUM SURGERY     NASAL TURBINATE REDUCTION Bilateral 12/12/2016   Procedure: TURBINATE REDUCTION/SUBMUCOSAL RESECTION;  Surgeon: Beverly Gust, MD;  Location: [facility];  Service: ENT;  Laterality: Bilateral;   Family History:  Family History  Problem Relation Age of Onset   Hepatitis C Mother    Cancer Mother    Cancer Maternal Aunt    Hypertension Maternal Grandmother    Family Psychiatric  History: No change Social History:  Social History   Substance and Sexual Activity  Alcohol Use Yes   Comment: 1/2-5th pint of liquor     Social History   Substance and Sexual Activity  Drug  Use Yes   Types: Cocaine, Marijuana   Comment: heroin    Social History   Socioeconomic History   Marital status: Divorced    Spouse name: Not on file   Number of children: Not on file   Years of education: Not on file   Highest education level: Not on file  Occupational History   Not on file  Tobacco Use   Smoking status: Every Day    Packs/day: 1.00    Years: 20.00    Additional pack years: 0.00    Total pack years: 20.00    Types: Cigarettes   Smokeless tobacco: Never  Vaping Use   Vaping Use: Never used  Substance and Sexual Activity   Alcohol use: Yes    Comment: 1/2-5th pint of liquor   Drug use: Yes    Types: Cocaine, Marijuana    Comment: heroin   Sexual activity: Yes    Birth control/protection: None  Other Topics Concern   Not on file  Social History Narrative   Not on file   Social Determinants of Health   Financial Resource Strain: Not on file  Food Insecurity: Food Insecurity Present (12/30/2022)   Hunger Vital Sign    Worried About Running Out of Food in the Last Year: Often true    Ran Out of Food in the Last Year: Often true  Transportation Needs: Unmet Transportation Needs (12/30/2022)  PRAPARE - Hydrologist (Medical): Yes    Lack of Transportation (Non-Medical): Yes  Physical Activity: Not on file  Stress: Not on file  Social Connections: Not on file   Additional Social History:                         Sleep: Fair  Appetite:  Fair  Current Medications: Current Facility-Administered Medications  Medication Dose Route Frequency Provider Last Rate Last Admin   acetaminophen (TYLENOL) tablet 650 mg  650 mg Oral Q6H PRN [name], [name]   650 mg at 01/04/23 0931   alum & mag hydroxide-simeth (MAALOX/MYLANTA) 200-200-20 MG/5ML suspension 30 mL  30 mL Oral Q4H PRN [name], [name]       ARIPiprazole (ABILIFY) tablet 10 mg  10 mg Oral Daily [name], [name]   10 mg at 01/17/23 [id number]    cloNIDine (CATAPRES) tablet 0.2 mg  0.2 mg Oral Q6H PRN [name], DO       diphenhydrAMINE (BENADRYL) capsule 50 mg  50 mg Oral TID PRN [name], [name]       Or   diphenhydrAMINE (BENADRYL) injection 50 mg  50 mg Intramuscular TID PRN [name], [name]       divalproex (DEPAKOTE ER) 24 hr tablet 500 mg  500 mg Oral BID Nehemie Casserly T, MD   500 mg at 01/17/23 0929   fluticasone (FLONASE) 50 MCG/ACT nasal spray 1 spray  1 spray Each Nare Daily [name], [name]   1 spray at 0000000 [id number]   folic acid (FOLVITE) tablet 1 mg  1 mg Oral Daily [name], [name]   1 mg at 01/17/23 [id number]   haloperidol (HALDOL) tablet 5 mg  5 mg Oral TID PRN [name], [name]       Or   haloperidol lactate (HALDOL) injection 5 mg  5 mg Intramuscular TID PRN [name], [name]       ibuprofen (ADVIL) tablet 600 mg  600 mg Oral Q6H PRN Natascha Edmonds T, MD       ipratropium (ATROVENT) nebulizer solution 0.5 mg  0.5 mg Nebulization BID PRN Falisa Lamora, [name], MD       losartan (COZAAR) tablet 50 mg  50 mg Oral Daily [name], DO   50 mg at 01/17/23 [id number]   magnesium hydroxide (MILK OF MAGNESIA) suspension 30 mL  30 mL Oral Daily PRN [name], [name]       metoprolol succinate (TOPROL-XL) 24 hr tablet 50 mg  50 mg Oral Daily [name], DO   50 mg at 01/14/23 [id number]   mirtazapine (REMERON) tablet 15 mg  15 mg Oral QHS [name], [name]   15 mg at 01/12/23 2245   multivitamin with minerals tablet 1 tablet  1 tablet Oral Daily [name], [name]   1 tablet at 01/17/23 [id number]   thiamine (VITAMIN B1) tablet 100 mg  100 mg Oral Daily [name], [name]   100 mg at 01/17/23 [id number]   traZODone (DESYREL) tablet 150 mg  150 mg Oral QHS PRN [name], DO   150 mg at 01/12/23 2244    Lab Results: No results found for this or any previous visit (from the past 55 hour(s)).  Blood Alcohol level:  Lab Results  Component Value Date   [facility] <10  12/19/2022   ETH <10 [id number]    Metabolic Disorder Labs: Lab Results  Component Value  Date   HGBA1C 5.8 (H) 12/31/2022   MPG 120 12/31/2022   MPG 111 04/02/2021   No results found for: "PROLACTIN" Lab Results  Component Value Date   CHOL 149 12/31/2022   TRIG 160 (H) 12/31/2022   HDL 26 (L) 12/31/2022   CHOLHDL 5.7 12/31/2022   VLDL 32 12/31/2022   LDLCALC 91 12/31/2022   LDLCALC 122 (H) 05/04/2021    Physical Findings: AIMS: Facial and Oral Movements Muscles of Facial Expression: None, normal Lips and Perioral Area: None, normal Jaw: None, normal Tongue: None, normal,Extremity Movements Upper (arms, wrists, hands, fingers): None, normal Lower (legs, knees, ankles, toes): None, normal, Trunk Movements Neck, shoulders, hips: None, normal, Overall Severity Severity of abnormal movements (highest score from questions above): None, normal Incapacitation due to abnormal movements: None, normal Patient's awareness of abnormal movements (rate only patient's report): No Awareness, Dental Status Current problems with teeth and/or dentures?: No Does patient usually wear dentures?: No  CIWA:    COWS:     Musculoskeletal: Strength & Muscle Tone: within normal limits Gait & Station: normal Patient leans: N/A  Psychiatric Specialty Exam:  Presentation  General Appearance:  Appropriate for Environment  Eye Contact: Good  Speech: Clear and Coherent  Speech Volume: Normal  Handedness: -- (unknown)   Mood and Affect  Mood: Depressed  Affect: Congruent   Thought Process  Thought Processes: Coherent; Goal Directed; Linear  Descriptions of Associations:Intact  Orientation:Full (Time, Place and Person)  Thought Content:Logical  History of Schizophrenia/Schizoaffective disorder:No data recorded Duration of Psychotic Symptoms:No data recorded Hallucinations:No data recorded Ideas of Reference:None  Suicidal Thoughts:No data recorded Homicidal  Thoughts:No data recorded  Sensorium  Memory: Immediate Fair; Recent Fair  Judgment: Fair  Insight: Good   Executive Functions  Concentration: Good  Attention Span: Good  Recall: Good  Fund of Knowledge: Good  Language: Good   Psychomotor Activity  Psychomotor Activity:No data recorded  Assets  Assets: Communication Skills; Leisure Time; Resilience   Sleep  Sleep:No data recorded   Physical Exam: Physical Exam Vitals and nursing note reviewed.  Constitutional:      Appearance: Normal appearance.  HENT:     Head: Normocephalic and atraumatic.     Mouth/Throat:     Pharynx: Oropharynx is clear.  Eyes:     Pupils: Pupils are equal, round, and reactive to light.  Cardiovascular:     Rate and Rhythm: Normal rate and regular rhythm.  Pulmonary:     Effort: Pulmonary effort is normal.     Breath sounds: Normal breath sounds.  Abdominal:     General: Abdomen is flat.     Palpations: Abdomen is soft.  Musculoskeletal:        General: Normal range of motion.  Skin:    General: Skin is warm and dry.  Neurological:     General: No focal deficit present.     Mental Status: He is alert. Mental status is at baseline.  Psychiatric:        Attention and Perception: Attention normal.        Mood and Affect: Mood normal.        Speech: Speech normal.        Behavior: Behavior is cooperative.        Thought Content: Thought content normal.        Cognition and Memory: Cognition normal.    Review of Systems  Constitutional: Negative.   HENT: Negative.    Eyes: Negative.   Respiratory: Negative.  Cardiovascular: Negative.   Gastrointestinal: Negative.   Musculoskeletal: Negative.   Skin: Negative.   Neurological: Negative.   Psychiatric/Behavioral: Negative.     Blood pressure (!) 96/55, pulse 77, temperature 98 F (36.7 C), temperature source Oral, resp. rate 18, height 5\' 10"  (1.778 m), weight 97.5 kg, SpO2 100 %. Body mass index is 30.85  kg/m.   Treatment Plan Summary: Medication management and Plan literally waiting for the patient to get his check on the third at which point we can discharge him  Alethia Berthold, MD 01/17/2023, 4:07 PM

## 2023-01-17 NOTE — [facility] Group Notes (Signed)
[place] Group Notes:  (Nursing/MHT/Case Management/Adjunct)  Date:  01/17/2023  Time:  9:30 PM  Type of Therapy:   Wrap up  Participation Level:  Active  Participation Quality:  Appropriate  Affect:   Picking with other patient  Cognitive:  Appropriate  Insight:  Good  Engagement in Group:  Engaged and want to get out of here to get ready for football season.  Modes of Intervention:   Sitting in group trying to go to slp.  Summary of Progress/Problems:  Cory Floyd 01/17/2023, 9:30 PM

## 2023-01-17 NOTE — Group Note (Signed)
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 Patients and LRT discuss what it means to be "at peace", what it feels like physically and mentally. Pts are given a canvas, paint, and stencils to use and encouraged to paint their idea of a peaceful place. Pts and LRT discuss how they use this in their daily life post discharge. Pts are encouraged to take their canvas home with them, once its dried, as a reminder of their peaceful place whenever they are feeling depressed, anxious, etc.   Affect/Mood: N/A   Participation Level: Did not attend    Clinical Observations/Individualized Feedback: Add did not attend group due to resting in his room.  Plan: Continue to engage patient in RT group sessions 2-3x/week.   Vilma Prader, LRT, CTRS 01/17/2023 11:08 AM

## 2023-01-17 NOTE — Plan of Care (Signed)

## 2023-01-17 NOTE — Progress Notes (Signed)
Patient pleasant and cooperative. Isolative to self and room. Minimal interaction with staff. Refused medications, states I  will be there but never comes. Denies SI, Hi, AVh. No concerns or complaints voiced. Encouragement and support provided. Safety checks maintained. Patient remains safe on unit with q 15 min checks.

## 2023-01-17 NOTE — Group Note (Signed)
[employer] LCSW Group Therapy Note   Group Date: 01/17/2023 Start Time: 1300 End Time: 1400   Type of Therapy/Topic:  Group Therapy:  Emotion Regulation  Participation Level:  Did Not Attend   Mood:  Description of Group:    The purpose of this group is to assist patients in learning to regulate negative emotions and experience positive emotions. Patients will be guided to discuss ways in which they have been vulnerable to their negative emotions. These vulnerabilities will be juxtaposed with experiences of positive emotions or situations, and patients challenged to use positive emotions to combat negative ones. Special emphasis will be placed on coping with negative emotions in conflict situations, and patients will process healthy conflict resolution skills.  Therapeutic Goals: Patient will identify two positive emotions or experiences to reflect on in order to balance out negative emotions:  Patient will label two or more emotions that they find the most difficult to experience:  Patient will be able to demonstrate positive conflict resolution skills through discussion or role plays:   Summary of Patient Progress: X   Therapeutic Modalities:   Cognitive Behavioral Therapy Feelings Identification Dialectical Behavioral Therapy   Shirl Harris, LCSW

## 2023-01-17 NOTE — Progress Notes (Signed)
Isolative to self and room this shift. No interaction with staff, minimal with peers. Would not get up for meds. No complaints or concerns voiced. Patient remains safe on unit with q 15 min checks.

## 2023-01-17 NOTE — Group Note (Signed)
Date:  01/17/2023 Time:  10:16 AM  Group Topic/Focus:  Goals Group:   The focus of this group is to help patients establish daily goals to achieve during treatment and discuss how the patient can incorporate goal setting into their daily lives to aide in recovery.  Community Group / Managing Wrong Interpretations   Participation Level:  Did Not Attend  Cory Floyd A Cory Floyd 01/17/2023, 10:16 AM

## 2023-01-17 NOTE — Plan of Care (Signed)

## 2023-01-17 NOTE — Plan of Care (Signed)
  Problem: Education: Goal: Knowledge of General Education information will improve Description: Including pain rating scale, medication(s)/side effects and non-pharmacologic comfort measures Outcome: Progressing   Problem: Coping: Goal: Level of anxiety will decrease Outcome: Progressing   Problem: Safety: Goal: Ability to remain free from injury will improve Outcome: Progressing   

## 2023-01-18 NOTE — Progress Notes (Signed)
[facility] MD Progress Note  01/18/2023 12:54 PM Cory Floyd  MRN:  [id number] Subjective: Follow-up with this patient with history of bipolar disorder and substance abuse.  Patient seen and chart reviewed.  Patient has no new complaints today.  States he continues to feel stable.  Spends time in his room but also gets up interacts with others takes care of his hygiene better.  No new complaints.  Patient denies suicidal ideation. 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   Anginal pain ([place])    Arthritis    Bipolar 1 disorder ([place])    COVID-19    Depressed    Diabetes mellitus without complication ([place])    Dyspnea    Fatty liver    Hypertension    Schizophrenia ([place])     Past Surgical History:  Procedure Laterality Date   COLONOSCOPY  02/05/2014   diverticulosis, hyperplastic polyp x 2   fracture arm Right    INCISION AND DRAINAGE ABSCESS Bilateral 12/29/2022   Procedure: INCISION AND DRAINAGE ABSCESS BILATERAL ARMS;  Surgeon: Shona Needles, MD;  Location: [place];  Service: Orthopedics;  Laterality: Bilateral;   KNEE SURGERY Right 2013   NASAL SEPTUM SURGERY     NASAL TURBINATE REDUCTION Bilateral 12/12/2016   Procedure: TURBINATE REDUCTION/SUBMUCOSAL RESECTION;  Surgeon: Beverly Gust, MD;  Location: [facility];  Service: ENT;  Laterality: Bilateral;   Family History:  Family History  Problem Relation Age of Onset   Hepatitis C Mother    Cancer Mother    Cancer Maternal Aunt    Hypertension Maternal  Grandmother    Family Psychiatric  History: See previous Social History:  Social History   Substance and Sexual Activity  Alcohol Use Yes   Comment: 1/2-5th pint of liquor     Social History   Substance and Sexual Activity  Drug Use Yes   Types: Cocaine, Marijuana   Comment: heroin    Social History   Socioeconomic History   Marital status: Divorced    Spouse name: Not on file   Number of children: Not on file   Years of education: Not on file   Highest education level: Not on file  Occupational History   Not on file  Tobacco Use   Smoking status: Every Day    Packs/day: 1.00    Years: 20.00    Additional pack years: 0.00    Total pack years: 20.00    Types: Cigarettes   Smokeless tobacco: Never  Vaping Use   Vaping Use: Never used  Substance and Sexual Activity   Alcohol use: Yes    Comment: 1/2-5th pint of liquor   Drug use: Yes    Types: Cocaine, Marijuana    Comment: heroin   Sexual activity: Yes    Birth control/protection: None  Other Topics Concern   Not on file  Social History Narrative   Not on file   Social Determinants of Health   Financial Resource Strain: Not on file  Food Insecurity: Food Insecurity Present (12/30/2022)  Hunger Vital Sign    Worried About Running Out of Food in the Last Year: Often true    Ran Out of Food in the Last Year: Often true  Transportation Needs: Unmet Transportation Needs (12/30/2022)   PRAPARE - Hydrologist (Medical): Yes    Lack of Transportation (Non-Medical): Yes  Physical Activity: Not on file  Stress: Not on file  Social Connections: Not on file   Additional Social History:                         Sleep: Fair  Appetite:  Fair  Current Medications: Current Facility-Administered Medications  Medication Dose Route Frequency Provider Last Rate Last Admin   acetaminophen (TYLENOL) tablet 650 mg  650 mg Oral Q6H PRN [name], [name]   650 mg at 01/04/23 0931    alum & mag hydroxide-simeth (MAALOX/MYLANTA) 200-200-20 MG/5ML suspension 30 mL  30 mL Oral Q4H PRN [name], [name]       ARIPiprazole (ABILIFY) tablet 10 mg  10 mg Oral Daily [name], [name]   10 mg at 01/18/23 [id number]   cloNIDine (CATAPRES) tablet 0.2 mg  0.2 mg Oral Q6H PRN [name], DO       diphenhydrAMINE (BENADRYL) capsule 50 mg  50 mg Oral TID PRN [name], [name]       Or   diphenhydrAMINE (BENADRYL) injection 50 mg  50 mg Intramuscular TID PRN [name], [name]       divalproex (DEPAKOTE ER) 24 hr tablet 500 mg  500 mg Oral BID Octa Uplinger T, MD   500 mg at 01/18/23 0634   fluticasone (FLONASE) 50 MCG/ACT nasal spray 1 spray  1 spray Each Nare Daily [name], [name]   1 spray at 0000000 [id number]   folic acid (FOLVITE) tablet 1 mg  1 mg Oral Daily [name], [name]   1 mg at 01/18/23 1144   haloperidol (HALDOL) tablet 5 mg  5 mg Oral TID PRN [name], [name]       Or   haloperidol lactate (HALDOL) injection 5 mg  5 mg Intramuscular TID PRN [name], [name]       ibuprofen (ADVIL) tablet 600 mg  600 mg Oral Q6H PRN Yesenia Locurto T, MD       ipratropium (ATROVENT) nebulizer solution 0.5 mg  0.5 mg Nebulization BID PRN [place] Smet, [name], MD       losartan (COZAAR) tablet 50 mg  50 mg Oral Daily [name], DO   50 mg at 01/18/23 1147   magnesium hydroxide (MILK OF MAGNESIA) suspension 30 mL  30 mL Oral Daily PRN [name], [name]       metoprolol succinate (TOPROL-XL) 24 hr tablet 50 mg  50 mg Oral Daily [name], DO   50 mg at 01/18/23 1144   mirtazapine (REMERON) tablet 15 mg  15 mg Oral QHS [name], [name]   15 mg at 01/12/23 2245   multivitamin with minerals tablet 1 tablet  1 tablet Oral Daily [name], [name]   1 tablet at 01/18/23 1144   thiamine (VITAMIN B1) tablet 100 mg  100 mg Oral Daily [name], [name]   100 mg at 01/18/23 1144   traZODone (DESYREL) tablet 150 mg  150  mg Oral QHS PRN [name], DO   150 mg at 01/12/23 2244    Lab Results: No results found for this or  any previous visit (from the past 48 hour(s)).  Blood Alcohol level:  Lab Results  Component Value Date   ETH <10 12/19/2022   ETH <10 [id number]    Metabolic Disorder Labs: Lab Results  Component Value Date   HGBA1C 5.8 (H) 12/31/2022   MPG 120 12/31/2022   MPG 111 04/02/2021   No results found for: "PROLACTIN" Lab Results  Component Value Date   CHOL 149 12/31/2022   TRIG 160 (H) 12/31/2022   HDL 26 (L) 12/31/2022   CHOLHDL 5.7 12/31/2022   VLDL 32 12/31/2022   LDLCALC 91 12/31/2022   LDLCALC 122 (H) 05/04/2021    Physical Findings: AIMS: Facial and Oral Movements Muscles of Facial Expression: None, normal Lips and Perioral Area: None, normal Jaw: None, normal Tongue: None, normal,Extremity Movements Upper (arms, wrists, hands, fingers): None, normal Lower (legs, knees, ankles, toes): None, normal, Trunk Movements Neck, shoulders, hips: None, normal, Overall Severity Severity of abnormal movements (highest score from questions above): None, normal Incapacitation due to abnormal movements: None, normal Patient's awareness of abnormal movements (rate only patient's report): No Awareness, Dental Status Current problems with teeth and/or dentures?: No Does patient usually wear dentures?: No  CIWA:    COWS:     Musculoskeletal: Strength & Muscle Tone: within normal limits Gait & Station: normal Patient leans: N/A  Psychiatric Specialty Exam:  Presentation  General Appearance:  Appropriate for Environment  Eye Contact: Good  Speech: Clear and Coherent  Speech Volume: Normal  Handedness: -- (unknown)   Mood and Affect  Mood: Depressed  Affect: Congruent   Thought Process  Thought Processes: Coherent; Goal Directed; Linear  Descriptions of Associations:Intact  Orientation:Full (Time, Place and Person)  Thought  Content:Logical  History of Schizophrenia/Schizoaffective disorder:No data recorded Duration of Psychotic Symptoms:No data recorded Hallucinations:No data recorded Ideas of Reference:None  Suicidal Thoughts:No data recorded Homicidal Thoughts:No data recorded  Sensorium  Memory: Immediate Fair; Recent Fair  Judgment: Fair  Insight: Good   Executive Functions  Concentration: Good  Attention Span: Good  Recall: Good  Fund of Knowledge: Good  Language: Good   Psychomotor Activity  Psychomotor Activity:No data recorded  Assets  Assets: Communication Skills; Leisure Time; Resilience   Sleep  Sleep:No data recorded   Physical Exam: Physical Exam Vitals and nursing note reviewed.  Constitutional:      Appearance: Normal appearance.  HENT:     Head: Normocephalic and atraumatic.     Mouth/Throat:     Pharynx: Oropharynx is clear.  Eyes:     Pupils: Pupils are equal, round, and reactive to light.  Cardiovascular:     Rate and Rhythm: Normal rate and regular rhythm.  Pulmonary:     Effort: Pulmonary effort is normal.     Breath sounds: Normal breath sounds.  Abdominal:     General: Abdomen is flat.     Palpations: Abdomen is soft.  Musculoskeletal:        General: Normal range of motion.  Skin:    General: Skin is warm and dry.  Neurological:     General: No focal deficit present.     Mental Status: He is alert. Mental status is at baseline.  Psychiatric:        Mood and Affect: Mood normal.        Thought Content: Thought content normal.    Review of Systems  Constitutional: Negative.   HENT: Negative.    Eyes: Negative.   Respiratory: Negative.    Cardiovascular: Negative.  Gastrointestinal: Negative.   Musculoskeletal: Negative.   Skin: Negative.   Neurological: Negative.   Psychiatric/Behavioral: Negative.     Blood pressure 124/70, pulse 82, temperature 98.2 F (36.8 C), temperature source Oral, resp. rate 18, height 5\' 10"   (1.778 m), weight 97.5 kg, SpO2 98 %. Body mass index is 30.85 kg/m.   Treatment Plan Summary: Medication management and Plan no change to medication management.  No change to plan.  We continue to wait until the patient has the resources for placement.  Alethia Berthold, MD 01/18/2023, 12:54 PM

## 2023-01-18 NOTE — Group Note (Signed)
LCSW Group Therapy Note  Group Date: 01/18/2023 Start Time: 1300 End Time: 1400   Type of Therapy and Topic:  Group Therapy - Healthy vs Unhealthy Coping Skills  Participation Level:  Did Not Attend   Description of Group The focus of this group was to determine what unhealthy coping techniques typically are used by group members and what healthy coping techniques would be helpful in coping with various problems. Patients were guided in becoming aware of the differences between healthy and unhealthy coping techniques. Patients were asked to identify 2-3 healthy coping skills they would like to learn to use more effectively.  Therapeutic Goals Patients learned that coping is what human beings do all day long to deal with various situations in their lives Patients defined and discussed healthy vs unhealthy coping techniques Patients identified their preferred coping techniques and identified whether these were healthy or unhealthy Patients determined 2-3 healthy coping skills they would like to become more familiar with and use more often. Patients provided support and ideas to each other   Summary of Patient Progress:   Patient did not attend group despite encouraged participation.    Therapeutic Modalities Cognitive Behavioral Therapy Motivational [place], [place] 01/18/2023  3:39 PM

## 2023-01-18 NOTE — Group Note (Signed)
Recreation Therapy Group Note   Group Topic:Problem Solving  Group Date: 01/18/2023 Start Time: 1000 End Time: 1050 Facilitators: Vilma Prader, LRT, CTRS Location:  Craft Room  Group Description: Life Boat. Patients were given the scenario that they are on a boat that is about to become shipwrecked, leaving them stranded on an [place]. They are asked to make a list of 15 different items that they want to take with them when they are stranded on the [place]. Patients are asked to rank their items from most important to least important, #1 being the most important and #15 being the least. Patients will work individually for the first round to come up with 15 items and then pair up with a peer(s) to condense their list and come up with one list of 15 items between the two of them. Patients or LRT will read aloud the 15 different items to the group after each round. LRT facilitated post-activity processing to discuss how this activity can be used in daily life post discharge.   Affect/Mood: N/A   Participation Level: Did not attend    Clinical Observations/Individualized Feedback: Cory Floyd did not attend group due to resting in his room.  Plan: Continue to engage patient in RT group sessions 2-3x/week.   Vilma Prader, LRT, [place] 01/18/2023 12:51 PM

## 2023-01-18 NOTE — Plan of Care (Signed)
  Problem: Education: Goal: Knowledge of General Education information will improve Description: Including pain rating scale, medication(s)/side effects and non-pharmacologic comfort measures Outcome: Progressing   Problem: Activity: Goal: Risk for activity intolerance will decrease Outcome: Progressing   Problem: Nutrition: Goal: Adequate nutrition will be maintained Outcome: Progressing   Problem: Coping: Goal: Level of anxiety will decrease Outcome: Progressing   

## 2023-01-18 NOTE — Group Note (Signed)
Date:  01/18/2023 Time:  10:09 PM  Group Topic/Focus:  Goals Group:   The focus of this group is to help patients establish daily goals to achieve during treatment and discuss how the patient can incorporate goal setting into their daily lives to aide in recovery.    Participation Level:  Active  Participation Quality:  Appropriate  Affect:  Appropriate  Cognitive:  Appropriate  Insight: Good  Engagement in Group:  Limited  Modes of Intervention:  Discussion  Additional Comments:     Cory Floyd 01/18/2023, 10:09 PM

## 2023-01-18 NOTE — Progress Notes (Signed)
Pt denies SI/HI/AVH and verbally agrees to approach staff if these become apparent or before harming themselves/others. Rates depression 0/10. Rates anxiety 0/10. Rates pain 0/10.  Pt has been in his room all day except for at meals. Pt refused evening Depakote. Some scheduled medications administered to pt, per MD orders. RN provided support and encouragement to pt. Q15 min safety checks implemented and continued. Pt is safe on the unit. Plan of care on going and no other concerns expressed at this time.  01/18/23 0800  Psych Admission Type (Psych Patients Only)  Admission Status Voluntary  Psychosocial Assessment  Patient Complaints None  Eye Contact Fair  Facial Expression Flat  Affect Appropriate to circumstance  Speech Logical/coherent  Interaction Assertive  Motor Activity Slow  Appearance/Hygiene In scrubs;Unremarkable  Behavior Characteristics Cooperative;Appropriate to situation;Calm  Mood Pleasant  Aggressive Behavior  Effect No apparent injury  Thought Process  Coherency WDL  Content WDL  Delusions None reported or observed  Perception WDL  Hallucination None reported or observed  Judgment Impaired  Confusion None  Danger to Self  Current suicidal ideation? Denies

## 2023-01-19 NOTE — Progress Notes (Signed)
Pt is irritable and isolative to bedroom.  Complained of insomnia, received PRN.  Pt was hypotensive in AM, BP rechecked and Gatorade offered.  Continued monitoring for safety.   01/19/23 0500  Psych Admission Type (Psych Patients Only)  Admission Status Voluntary  Psychosocial Assessment  Patient Complaints None  Eye Contact Fair  Facial Expression Flat  Affect Appropriate to circumstance  Speech Logical/coherent  Interaction Assertive  Motor Activity Slow  Appearance/Hygiene In scrubs  Behavior Characteristics Cooperative  Mood Pleasant  Thought Process  Coherency WDL  Content WDL  Delusions None reported or observed  Perception WDL  Hallucination None reported or observed  Judgment Impaired  Confusion None  Danger to Self  Current suicidal ideation? Denies  Danger to Others  Danger to Others None reported or observed

## 2023-01-19 NOTE — Progress Notes (Signed)
[facility] MD Progress Note  01/19/2023 4:11 PM Cory Floyd  MRN:  [id number] Subjective: Follow-up 47 year old man with bipolar disorder.  Patient continues to say he is feeling fine.  Denies depression denies suicidal thoughts.  Just waiting for funds to be available.  He did point out to me that it looks like his sutures can be safely removed from his arms. 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   Anginal pain ([place])    Arthritis    Bipolar 1 disorder ([place])    COVID-19    Depressed    Diabetes mellitus without complication ([place])    Dyspnea    Fatty liver    Hypertension    Schizophrenia ([place])     Past Surgical History:  Procedure Laterality Date   COLONOSCOPY  02/05/2014   diverticulosis, hyperplastic polyp x 2   fracture arm Right    INCISION AND DRAINAGE ABSCESS Bilateral 12/29/2022   Procedure: INCISION AND DRAINAGE ABSCESS BILATERAL ARMS;  Surgeon: Shona Needles, MD;  Location: [place];  Service: Orthopedics;  Laterality: Bilateral;   KNEE SURGERY Right 2013   NASAL SEPTUM SURGERY     NASAL TURBINATE REDUCTION Bilateral 12/12/2016   Procedure: TURBINATE REDUCTION/SUBMUCOSAL RESECTION;  Surgeon: Beverly Gust, MD;  Location: [facility];  Service: ENT;  Laterality: Bilateral;   Family History:  Family History  Problem Relation Age of Onset   Hepatitis C Mother    Cancer Mother    Cancer Maternal Aunt    Hypertension Maternal Grandmother    Family Psychiatric  History: See previous Social  History:  Social History   Substance and Sexual Activity  Alcohol Use Yes   Comment: 1/2-5th pint of liquor     Social History   Substance and Sexual Activity  Drug Use Yes   Types: Cocaine, Marijuana   Comment: heroin    Social History   Socioeconomic History   Marital status: Divorced    Spouse name: Not on file   Number of children: Not on file   Years of education: Not on file   Highest education level: Not on file  Occupational History   Not on file  Tobacco Use   Smoking status: Every Day    Packs/day: 1.00    Years: 20.00    Additional pack years: 0.00    Total pack years: 20.00    Types: Cigarettes   Smokeless tobacco: Never  Vaping Use   Vaping Use: Never used  Substance and Sexual Activity   Alcohol use: Yes    Comment: 1/2-5th pint of liquor   Drug use: Yes    Types: Cocaine, Marijuana    Comment: heroin   Sexual activity: Yes    Birth control/protection: None  Other Topics Concern   Not on file  Social History Narrative   Not on file   Social Determinants of Health   Financial Resource Strain: Not on file  Food Insecurity: Food Insecurity Present (12/30/2022)   Hunger Vital Sign    Worried About Running  Out of Food in the Last Year: Often true    Ran Out of Food in the Last Year: Often true  Transportation Needs: Unmet Transportation Needs (12/30/2022)   PRAPARE - Hydrologist (Medical): Yes    Lack of Transportation (Non-Medical): Yes  Physical Activity: Not on file  Stress: Not on file  Social Connections: Not on file   Additional Social History:                         Sleep: Fair  Appetite:  Fair  Current Medications: Current Facility-Administered Medications  Medication Dose Route Frequency Provider Last Rate Last Admin   acetaminophen (TYLENOL) tablet 650 mg  650 mg Oral Q6H PRN [name], [name]   650 mg at 01/04/23 0931   alum & mag hydroxide-simeth (MAALOX/MYLANTA) 200-200-20 MG/5ML  suspension 30 mL  30 mL Oral Q4H PRN [name], [name]       ARIPiprazole (ABILIFY) tablet 10 mg  10 mg Oral Daily [name], [name]   10 mg at 01/18/23 [id number]   cloNIDine (CATAPRES) tablet 0.2 mg  0.2 mg Oral Q6H PRN [name], DO       diphenhydrAMINE (BENADRYL) capsule 50 mg  50 mg Oral TID PRN [name], [name]       Or   diphenhydrAMINE (BENADRYL) injection 50 mg  50 mg Intramuscular TID PRN [name], [name]       divalproex (DEPAKOTE ER) 24 hr tablet 500 mg  500 mg Oral BID Carolyne Whitsel T, MD   500 mg at 01/18/23 0634   fluticasone (FLONASE) 50 MCG/ACT nasal spray 1 spray  1 spray Each Nare Daily [name], [name]   1 spray at 0000000 [id number]   folic acid (FOLVITE) tablet 1 mg  1 mg Oral Daily [name], [name]   1 mg at 01/18/23 1144   haloperidol (HALDOL) tablet 5 mg  5 mg Oral TID PRN [name], [name]       Or   haloperidol lactate (HALDOL) injection 5 mg  5 mg Intramuscular TID PRN [name], [name]       ibuprofen (ADVIL) tablet 600 mg  600 mg Oral Q6H PRN Jeyda Siebel T, MD       ipratropium (ATROVENT) nebulizer solution 0.5 mg  0.5 mg Nebulization BID PRN Theoren Palka, [name], MD       losartan (COZAAR) tablet 50 mg  50 mg Oral Daily [name], DO   50 mg at 01/18/23 1147   magnesium hydroxide (MILK OF MAGNESIA) suspension 30 mL  30 mL Oral Daily PRN [name], [name]       metoprolol succinate (TOPROL-XL) 24 hr tablet 50 mg  50 mg Oral Daily [name], DO   50 mg at 01/18/23 1144   mirtazapine (REMERON) tablet 15 mg  15 mg Oral QHS [name], [name]   15 mg at 01/18/23 2323   multivitamin with minerals tablet 1 tablet  1 tablet Oral Daily [name], [name]   1 tablet at 01/18/23 1144   thiamine (VITAMIN B1) tablet 100 mg  100 mg Oral Daily [name], [name]   100 mg at 01/18/23 1144   traZODone (DESYREL) tablet 150 mg  150 mg Oral QHS PRN [name], DO   150 mg at 01/18/23  2323    Lab Results: No results found for this or any previous visit (from the past 8 hour(s)).  Blood Alcohol level:  Lab Results  Component Value Date   ETH <10 12/19/2022   ETH <10 [id number]    Metabolic Disorder Labs: Lab Results  Component Value Date   HGBA1C 5.8 (H) 12/31/2022   MPG 120 12/31/2022   MPG 111 04/02/2021   No results found for: "PROLACTIN" Lab Results  Component Value Date   CHOL 149 12/31/2022   TRIG 160 (H) 12/31/2022   HDL 26 (L) 12/31/2022   CHOLHDL 5.7 12/31/2022   VLDL 32 12/31/2022   LDLCALC 91 12/31/2022   LDLCALC 122 (H) 05/04/2021    Physical Findings: AIMS: Facial and Oral Movements Muscles of Facial Expression: None, normal Lips and Perioral Area: None, normal Jaw: None, normal Tongue: None, normal,Extremity Movements Upper (arms, wrists, hands, fingers): None, normal Lower (legs, knees, ankles, toes): None, normal, Trunk Movements Neck, shoulders, hips: None, normal, Overall Severity Severity of abnormal movements (highest score from questions above): None, normal Incapacitation due to abnormal movements: None, normal Patient's awareness of abnormal movements (rate only patient's report): No Awareness, Dental Status Current problems with teeth and/or dentures?: No Does patient usually wear dentures?: No  CIWA:    COWS:     Musculoskeletal: Strength & Muscle Tone: within normal limits Gait & Station: normal Patient leans: N/A  Psychiatric Specialty Exam:  Presentation  General Appearance:  Appropriate for Environment  Eye Contact: Good  Speech: Clear and Coherent  Speech Volume: Normal  Handedness: -- (unknown)   Mood and Affect  Mood: Depressed  Affect: Congruent   Thought Process  Thought Processes: Coherent; Goal Directed; Linear  Descriptions of Associations:Intact  Orientation:Full (Time, Place and Person)  Thought Content:Logical  History of Schizophrenia/Schizoaffective disorder:No data  recorded Duration of Psychotic Symptoms:No data recorded Hallucinations:No data recorded Ideas of Reference:None  Suicidal Thoughts:No data recorded Homicidal Thoughts:No data recorded  Sensorium  Memory: Immediate Fair; Recent Fair  Judgment: Fair  Insight: Good   Executive Functions  Concentration: Good  Attention Span: Good  Recall: Good  Fund of Knowledge: Good  Language: Good   Psychomotor Activity  Psychomotor Activity:No data recorded  Assets  Assets: Communication Skills; Leisure Time; Resilience   Sleep  Sleep:No data recorded   Physical Exam: Physical Exam Vitals and nursing note reviewed.  Constitutional:      Appearance: Normal appearance.  HENT:     Head: Normocephalic and atraumatic.     Mouth/Throat:     Pharynx: Oropharynx is clear.  Eyes:     Pupils: Pupils are equal, round, and reactive to light.  Cardiovascular:     Rate and Rhythm: Normal rate and regular rhythm.  Pulmonary:     Effort: Pulmonary effort is normal.     Breath sounds: Normal breath sounds.  Abdominal:     General: Abdomen is flat.     Palpations: Abdomen is soft.  Musculoskeletal:        General: Normal range of motion.  Skin:    General: Skin is warm and dry.  Neurological:     General: No focal deficit present.     Mental Status: He is alert. Mental status is at baseline.  Psychiatric:        Attention and Perception: Attention normal.        Mood and Affect: Mood normal.        Speech: Speech normal.        Behavior: Behavior normal.        Thought Content: Thought content normal.  Cognition and Memory: Cognition normal.    Review of Systems  Constitutional: Negative.   HENT: Negative.    Eyes: Negative.   Respiratory: Negative.    Cardiovascular: Negative.   Gastrointestinal: Negative.   Musculoskeletal: Negative.   Skin: Negative.   Neurological: Negative.   Psychiatric/Behavioral: Negative.     Blood pressure (!) 90/43, pulse  77, temperature 97.9 F (36.6 C), temperature source Oral, resp. rate 18, height 5\' 10"  (1.778 m), weight 97.5 kg, SpO2 97 %. Body mass index is 30.85 kg/m.   Treatment Plan Summary: Medication management and Plan I put in an order for suture removal and spoke with the nurses about it.  No emergency but can probably be done sometime in the next 1 or 2 shifts.  Otherwise no change to treatment and we are still looking forward to probably getting him discharge next week.  Alethia Berthold, MD 01/19/2023, 4:11 PM

## 2023-01-19 NOTE — Plan of Care (Signed)
  Problem: Education: Goal: Knowledge of General Education information will improve Description: Including pain rating scale, medication(s)/side effects and non-pharmacologic comfort measures Outcome: Progressing   Problem: Nutrition: Goal: Adequate nutrition will be maintained Outcome: Progressing   Problem: Coping: Goal: Level of anxiety will decrease Outcome: Progressing   

## 2023-01-19 NOTE — Progress Notes (Signed)
Pt denies SI/HI/AVH and verbally agrees to approach staff if these become apparent or before harming themselves/others. Rates depression 0/10. Rates anxiety 0/10. Rates pain 0/10.  Pt refused morning meds and stated that he already got meds. Pt informed that the last time he got meds was at 2300 last night. Pt swore that someone came in to give him a "cup full of meds." Pt reassured that that was not the case. Pt still refused. Pt was excited to get sutures removed at 1630. Wound intact, clean, and dry. Scheduled medications administered to pt, per MD orders. RN provided support and encouragement to pt. Q15 min safety checks implemented and continued. Pt is safe on the unit. Plan of care on going and no other concerns expressed at this time.  01/19/23 0900  Psych Admission Type (Psych Patients Only)  Admission Status Voluntary  Psychosocial Assessment  Patient Complaints None  Eye Contact Brief  Facial Expression Flat  Affect Irritable  Speech Logical/coherent  Interaction Isolative  Motor Activity Slow  Appearance/Hygiene Unremarkable;In scrubs  Behavior Characteristics Cooperative;Appropriate to situation;Irritable  Mood Irritable  Aggressive Behavior  Effect No apparent injury  Thought Process  Coherency WDL  Content WDL  Delusions None reported or observed  Perception WDL  Hallucination None reported or observed  Judgment Impaired  Confusion Mild  Danger to Self  Current suicidal ideation? Denies  Danger to Others  Danger to Others None reported or observed

## 2023-01-19 NOTE — Group Note (Signed)
Recreation Therapy Group Note   Group Topic:Leisure Education  Group Date: 01/19/2023 Start Time: 1000 End Time: 1055 Facilitators: Vilma Prader, LRT, CTRS Location:  Craft Room  Group Description: Leisure. Patients were given the option to choose from coloring mandalas, playing apples to apples, or making origami duration of session while listening to music. LRT and pts discussed the importance of participating in leisure during their free time and when they're outside of the hospital. Pt identified two leisure interests and shared with the group.   Affect/Mood: N/A   Participation Level: Did not attend    Clinical Observations/Individualized Feedback: Pt did not attend group due to resting in his room.  Plan: Continue to engage patient in RT group sessions 2-3x/week.   Vilma Prader, LRT, CTRS 01/19/2023 11:17 AM

## 2023-01-19 NOTE — [facility] Group Notes (Signed)
[place] Group Notes:  (Nursing/MHT/Case Management/Adjunct)  Date:  01/19/2023  Time:  9:06 PM  Type of Therapy:   Wrap up  Participation Level:  Active  Participation Quality:  Monopolizing  Affect:  Appropriate  Cognitive:  Alert  Insight:  Good  Engagement in Group:  Engaged and his goal was to get his stitches out of his arm.  Modes of Intervention:  Support  Summary of Progress/Problems:  Cory Floyd 01/19/2023, 9:06 PM

## 2023-01-20 NOTE — Progress Notes (Signed)
D- Patient alert and oriented x 4. Affect irritable/mood gruff. Denies SI/ HI/ AVH. He denies pain. He refuses medications stating "they kept me up all night- I am not going to take them". Patient stayed in room in the bed  for most of the shift. A- Scheduled medications refused by patient. MD notified. Support and encouragement provided.  Routine safety checks conducted every 15 minutes without incident.  Patient informed to notify staff with problems or concerns and verbalizes understanding. R- Patient non compliant with medications and treatment plan. Patient unreceptive and uncooperative this shift.. Patient isolative to his room except for meals. He contracts for safety and remains safe on the unit at this time.

## 2023-01-20 NOTE — Progress Notes (Signed)
[facility] MD Progress Note  01/20/2023 11:58 AM Cory Floyd  MRN:  [id number] Subjective: No new complaints.  Mood stable.  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   Anginal pain ([place])    Arthritis    Bipolar 1 disorder ([place])    COVID-19    Depressed    Diabetes mellitus without complication ([place])    Dyspnea    Fatty liver    Hypertension    Schizophrenia ([place])     Past Surgical History:  Procedure Laterality Date   COLONOSCOPY  02/05/2014   diverticulosis, hyperplastic polyp x 2   fracture arm Right    INCISION AND DRAINAGE ABSCESS Bilateral 12/29/2022   Procedure: INCISION AND DRAINAGE ABSCESS BILATERAL ARMS;  Surgeon: Shona Needles, MD;  Location: [place];  Service: Orthopedics;  Laterality: Bilateral;   KNEE SURGERY Right 2013   NASAL SEPTUM SURGERY     NASAL TURBINATE REDUCTION Bilateral 12/12/2016   Procedure: TURBINATE REDUCTION/SUBMUCOSAL RESECTION;  Surgeon: Beverly Gust, MD;  Location: [facility];  Service: ENT;  Laterality: Bilateral;   Family History:  Family History  Problem Relation Age of Onset   Hepatitis C Mother    Cancer Mother    Cancer Maternal Aunt    Hypertension Maternal Grandmother    Family Psychiatric  History: None Social History:  Social History   Substance and Sexual Activity  Alcohol Use Yes   Comment: 1/2-5th pint of liquor     Social History   Substance and Sexual Activity  Drug Use Yes   Types: Cocaine, Marijuana   Comment:  heroin    Social History   Socioeconomic History   Marital status: Divorced    Spouse name: Not on file   Number of children: Not on file   Years of education: Not on file   Highest education level: Not on file  Occupational History   Not on file  Tobacco Use   Smoking status: Every Day    Packs/day: 1.00    Years: 20.00    Additional pack years: 0.00    Total pack years: 20.00    Types: Cigarettes   Smokeless tobacco: Never  Vaping Use   Vaping Use: Never used  Substance and Sexual Activity   Alcohol use: Yes    Comment: 1/2-5th pint of liquor   Drug use: Yes    Types: Cocaine, Marijuana    Comment: heroin   Sexual activity: Yes    Birth control/protection: None  Other Topics Concern   Not on file  Social History Narrative   Not on file   Social Determinants of Health   Financial Resource Strain: Not on file  Food Insecurity: Food Insecurity Present (12/30/2022)   Hunger Vital Sign    Worried About Running Out of Food in the Last Year: Often true    Ran Out of Food in the Last Year: Often true  Transportation Needs: Unmet Transportation Needs (12/30/2022)   PRAPARE - Hydrologist (  Medical): Yes    Lack of Transportation (Non-Medical): Yes  Physical Activity: Not on file  Stress: Not on file  Social Connections: Not on file   Additional Social History:                         Sleep: Fair  Appetite:  Fair  Current Medications: Current Facility-Administered Medications  Medication Dose Route Frequency Provider Last Rate Last Admin   acetaminophen (TYLENOL) tablet 650 mg  650 mg Oral Q6H PRN Cory [place], Cory Floyd   650 mg at 01/04/23 0931   alum & mag hydroxide-simeth (MAALOX/MYLANTA) 200-200-20 MG/5ML suspension 30 mL  30 mL Oral Q4H PRN Cory [place], Cory Floyd       ARIPiprazole (ABILIFY) tablet 10 mg  10 mg Oral Daily Cory [place], Cory Floyd   10 mg at 01/18/23 [id number]   cloNIDine (CATAPRES) tablet 0.2 mg  0.2 mg Oral Q6H  PRN Cory Ranger, DO       diphenhydrAMINE (BENADRYL) capsule 50 mg  50 mg Oral TID PRN Cory [place], Cory Floyd       Or   diphenhydrAMINE (BENADRYL) injection 50 mg  50 mg Intramuscular TID PRN Cory [place], Cory Floyd       divalproex (DEPAKOTE ER) 24 hr tablet 500 mg  500 mg Oral BID Cory Wurtz T, MD   500 mg at 01/19/23 1633   fluticasone (FLONASE) 50 MCG/ACT nasal spray 1 spray  1 spray Each Nare Daily Cory [place], Cory Floyd   1 spray at 0000000 [id number]   folic acid (FOLVITE) tablet 1 mg  1 mg Oral Daily Cory [place], Cory Floyd   1 mg at 01/18/23 1144   haloperidol (HALDOL) tablet 5 mg  5 mg Oral TID PRN Cory [place], Cory Floyd       Or   haloperidol lactate (HALDOL) injection 5 mg  5 mg Intramuscular TID PRN Cory [place], Cory Floyd       ibuprofen (ADVIL) tablet 600 mg  600 mg Oral Q6H PRN Cory Gubser T, MD       ipratropium (ATROVENT) nebulizer solution 0.5 mg  0.5 mg Nebulization BID PRN Cory [place], Cory Reno, MD       losartan (COZAAR) tablet 50 mg  50 mg Oral Daily Cory Ranger, DO   50 mg at 01/18/23 1147   magnesium hydroxide (MILK OF MAGNESIA) suspension 30 mL  30 mL Oral Daily PRN Cory [place], Cory Floyd       metoprolol succinate (TOPROL-XL) 24 hr tablet 50 mg  50 mg Oral Daily Cory Ranger, DO   50 mg at 01/18/23 1144   mirtazapine (REMERON) tablet 15 mg  15 mg Oral QHS Cory [place], Cory Floyd   15 mg at 01/18/23 2323   multivitamin with minerals tablet 1 tablet  1 tablet Oral Daily Cory [place], Cory Floyd   1 tablet at 01/18/23 1144   thiamine (VITAMIN B1) tablet 100 mg  100 mg Oral Daily Cory [place], Cory Floyd   100 mg at 01/18/23 1144   traZODone (DESYREL) tablet 150 mg  150 mg Oral QHS PRN Cory Ranger, DO   150 mg at 01/18/23 2323    Lab Results: No results found for this or any previous visit (from the past 65 hour(s)).  Blood Alcohol level:  Lab Results  Component Value Date   [facility] <10 12/19/2022   ETH <10 [id number]    Metabolic  Disorder Labs: Lab Results  Component Value Date   HGBA1C 5.8 (H) 12/31/2022  MPG 120 12/31/2022   MPG 111 04/02/2021   No results found for: "PROLACTIN" Lab Results  Component Value Date   CHOL 149 12/31/2022   TRIG 160 (H) 12/31/2022   HDL 26 (L) 12/31/2022   CHOLHDL 5.7 12/31/2022   VLDL 32 12/31/2022   LDLCALC 91 12/31/2022   LDLCALC 122 (H) 05/04/2021    Physical Findings: AIMS: Facial and Oral Movements Muscles of Facial Expression: None, normal Lips and Perioral Area: None, normal Jaw: None, normal Tongue: None, normal,Extremity Movements Upper (arms, wrists, hands, fingers): None, normal Lower (legs, knees, ankles, toes): None, normal, Trunk Movements Neck, shoulders, hips: None, normal, Overall Severity Severity of abnormal movements (highest score from questions above): None, normal Incapacitation due to abnormal movements: None, normal Patient's awareness of abnormal movements (rate only patient's report): No Awareness, Dental Status Current problems with teeth and/or dentures?: No Does patient usually wear dentures?: No  CIWA:    COWS:     Musculoskeletal: Strength & Muscle Tone: within normal limits Gait & Station: normal Patient leans: N/[facility]  Psychiatric Specialty Exam:  Presentation  General Appearance:  Appropriate for Environment  Eye Contact: Good  Speech: Clear and Coherent  Speech Volume: Normal  Handedness: -- (unknown)   Mood and Affect  Mood: Depressed  Affect: Congruent   Thought Process  Thought Processes: Coherent; Goal Directed; Linear  Descriptions of Associations:Intact  Orientation:Full (Time, Place and Person)  Thought Content:Logical  History of Schizophrenia/Schizoaffective disorder:No data recorded Duration of Psychotic Symptoms:No data recorded Hallucinations:No data recorded Ideas of Reference:None  Suicidal Thoughts:No data recorded Homicidal Thoughts:No data recorded  Sensorium   Memory: Immediate Fair; Recent Fair  Judgment: Fair  Insight: Good   Executive Functions  Concentration: Good  Attention Span: Good  Recall: Good  Fund of Knowledge: Good  Language: Good   Psychomotor Activity  Psychomotor Activity:No data recorded  Assets  Assets: Communication Skills; Leisure Time; Resilience   Sleep  Sleep:No data recorded   Physical Exam: Physical Exam Vitals and nursing note reviewed.  Constitutional:      Appearance: Normal appearance.  HENT:     Head: Normocephalic and atraumatic.     Mouth/Throat:     Pharynx: Oropharynx is clear.  Eyes:     Pupils: Pupils are equal, round, and reactive to light.  Cardiovascular:     Rate and Rhythm: Normal rate and regular rhythm.  Pulmonary:     Effort: Pulmonary effort is normal.     Breath sounds: Normal breath sounds.  Abdominal:     General: Abdomen is flat.     Palpations: Abdomen is soft.  Musculoskeletal:        General: Normal range of motion.  Skin:    General: Skin is warm and dry.  Neurological:     General: No focal deficit present.     Mental Status: He is alert. Mental status is at baseline.  Psychiatric:        Attention and Perception: Attention normal.        Mood and Affect: Mood normal.        Speech: Speech normal.        Behavior: Behavior normal.        Thought Content: Thought content normal.        Cognition and Memory: Cognition normal.        Judgment: Judgment normal.    Review of Systems  Constitutional: Negative.   HENT: Negative.    Eyes: Negative.   Respiratory: Negative.  Cardiovascular: Negative.   Gastrointestinal: Negative.   Musculoskeletal: Negative.   Skin: Negative.   Neurological: Negative.   Psychiatric/Behavioral: Negative.     Blood pressure 95/64, pulse 73, temperature 98.6 F (37 C), resp. rate 18, height 5\' 10"  (1.778 m), weight 97.5 kg, SpO2 97 %. Body mass index is 30.85 kg/m.   Treatment Plan Summary: Medication  management and Plan we have just been letting him stay here until he has enough money to get into [place] place to live since he does not have many resources and he cannot go to [facility].  No change to treatment plan.  Alethia Berthold, MD 01/20/2023, 11:58 AM

## 2023-01-20 NOTE — Progress Notes (Signed)
Pt was in bed/sleeping majority of shift.  Pt refused evening medications.  Pt denies SI Hi AVH.  Continued monitoring for safety.  01/20/23 0500  Psych Admission Type (Psych Patients Only)  Admission Status Voluntary  Psychosocial Assessment  Patient Complaints None  Eye Contact Brief  Facial Expression Flat  Affect Irritable  Speech Logical/coherent  Interaction Isolative  Motor Activity Slow  Appearance/Hygiene In scrubs  Behavior Characteristics Appropriate to situation  Mood Irritable  Thought Process  Coherency WDL  Content WDL  Delusions WDL  Perception WDL  Hallucination None reported or observed  Judgment Impaired  Confusion None  Danger to Self  Current suicidal ideation? Denies  Danger to Others  Danger to Others None reported or observed

## 2023-01-21 NOTE — Progress Notes (Signed)
[facility] MD Progress Note  01/21/2023 10:07 AM Cory Floyd  MRN:  [id number] Subjective: Patient seen and chart reviewed.  No new complaints.  Reports that he is feeling pretty much at his baseline.  Asked me today whether he could look at his phone to see if his money was available which shows that he is actually taking a slight bit of motivation to find a place to live or get out of the hospital. 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   Anginal pain ([place])    Arthritis    Bipolar 1 disorder ([place])    COVID-19    Depressed    Diabetes mellitus without complication ([place])    Dyspnea    Fatty liver    Hypertension    Schizophrenia ([place])     Past Surgical History:  Procedure Laterality Date   COLONOSCOPY  02/05/2014   diverticulosis, hyperplastic polyp x 2   fracture arm Right    INCISION AND DRAINAGE ABSCESS Bilateral 12/29/2022   Procedure: INCISION AND DRAINAGE ABSCESS BILATERAL ARMS;  Surgeon: Shona Needles, MD;  Location: [place];  Service: Orthopedics;  Laterality: Bilateral;   KNEE SURGERY Right 2013   NASAL SEPTUM SURGERY     NASAL TURBINATE REDUCTION Bilateral 12/12/2016   Procedure: TURBINATE REDUCTION/SUBMUCOSAL RESECTION;  Surgeon: Beverly Gust, MD;  Location: [facility];  Service: ENT;  Laterality: Bilateral;   Family History:  Family History  Problem Relation Age of Onset   Hepatitis C Mother    Cancer Mother    Cancer Maternal Aunt    Hypertension Maternal Grandmother     Family Psychiatric  History: See previous Social History:  Social History   Substance and Sexual Activity  Alcohol Use Yes   Comment: 1/2-5th pint of liquor     Social History   Substance and Sexual Activity  Drug Use Yes   Types: Cocaine, Marijuana   Comment: heroin    Social History   Socioeconomic History   Marital status: Divorced    Spouse name: Not on file   Number of children: Not on file   Years of education: Not on file   Highest education level: Not on file  Occupational History   Not on file  Tobacco Use   Smoking status: Every Day    Packs/day: 1.00    Years: 20.00    Additional pack years: 0.00    Total pack years: 20.00    Types: Cigarettes   Smokeless tobacco: Never  Vaping Use   Vaping Use: Never used  Substance and Sexual Activity   Alcohol use: Yes    Comment: 1/2-5th pint of liquor   Drug use: Yes    Types: Cocaine, Marijuana    Comment: heroin   Sexual activity: Yes    Birth control/protection: None  Other Topics Concern   Not on file  Social History Narrative   Not on file   Social Determinants of Health   Financial Resource Strain: Not on file  Food  Insecurity: Food Insecurity Present (12/30/2022)   Hunger Vital Sign    Worried About Running Out of Food in the Last Year: Often true    Ran Out of Food in the Last Year: Often true  Transportation Needs: Unmet Transportation Needs (12/30/2022)   PRAPARE - Hydrologist (Medical): Yes    Lack of Transportation (Non-Medical): Yes  Physical Activity: Not on file  Stress: Not on file  Social Connections: Not on file   Additional Social History:                         Sleep: Fair  Appetite:  Fair  Current Medications: Current Facility-Administered Medications  Medication Dose Route Frequency Provider Last Rate Last Admin   acetaminophen (TYLENOL) tablet 650 mg  650 mg Oral Q6H PRN [name], [name]   650 mg at 01/04/23 0931   alum & mag  hydroxide-simeth (MAALOX/MYLANTA) 200-200-20 MG/5ML suspension 30 mL  30 mL Oral Q4H PRN [name], [name]       ARIPiprazole (ABILIFY) tablet 10 mg  10 mg Oral Daily [name], [name]   10 mg at 01/18/23 [id number]   cloNIDine (CATAPRES) tablet 0.2 mg  0.2 mg Oral Q6H PRN [name], DO       diphenhydrAMINE (BENADRYL) capsule 50 mg  50 mg Oral TID PRN [name], [name]       Or   diphenhydrAMINE (BENADRYL) injection 50 mg  50 mg Intramuscular TID PRN [name], [name]       divalproex (DEPAKOTE ER) 24 hr tablet 500 mg  500 mg Oral BID Matvey Llanas T, MD   500 mg at 01/20/23 1721   fluticasone (FLONASE) 50 MCG/ACT nasal spray 1 spray  1 spray Each Nare Daily [name], [name]   1 spray at [id number]   folic acid (FOLVITE) tablet 1 mg  1 mg Oral Daily [name], [name]   1 mg at 01/18/23 1144   haloperidol (HALDOL) tablet 5 mg  5 mg Oral TID PRN [name], [name]       Or   haloperidol lactate (HALDOL) injection 5 mg  5 mg Intramuscular TID PRN [name], [name]       ibuprofen (ADVIL) tablet 600 mg  600 mg Oral Q6H PRN Neeraj Housand T, MD       ipratropium (ATROVENT) nebulizer solution 0.5 mg  0.5 mg Nebulization BID PRN Oseph Imburgia, [name], MD       losartan (COZAAR) tablet 50 mg  50 mg Oral Daily [name], DO   50 mg at 01/18/23 1147   magnesium hydroxide (MILK OF MAGNESIA) suspension 30 mL  30 mL Oral Daily PRN [name], [name]       metoprolol succinate (TOPROL-XL) 24 hr tablet 50 mg  50 mg Oral Daily [name], DO   50 mg at 01/18/23 1144   mirtazapine (REMERON) tablet 15 mg  15 mg Oral QHS [name], [name]   15 mg at 01/20/23 2345   multivitamin with minerals tablet 1 tablet  1 tablet Oral Daily [name], [name]   1 tablet at 01/18/23 1144   thiamine (VITAMIN B1) tablet 100 mg  100 mg Oral Daily [name], [name]   100 mg at 01/18/23 1144   traZODone (DESYREL) tablet 150 mg  150 mg Oral QHS  PRN [name], DO   150 mg at 01/20/23 2345    Lab  Results: No results found for this or any previous visit (from the past 48 hour(s)).  Blood Alcohol level:  Lab Results  Component Value Date   ETH <10 12/19/2022   ETH <10 [id number]    Metabolic Disorder Labs: Lab Results  Component Value Date   HGBA1C 5.8 (H) 12/31/2022   MPG 120 12/31/2022   MPG 111 04/02/2021   No results found for: "PROLACTIN" Lab Results  Component Value Date   CHOL 149 12/31/2022   TRIG 160 (H) 12/31/2022   HDL 26 (L) 12/31/2022   CHOLHDL 5.7 12/31/2022   VLDL 32 12/31/2022   LDLCALC 91 12/31/2022   LDLCALC 122 (H) 05/04/2021    Physical Findings: AIMS: Facial and Oral Movements Muscles of Facial Expression: None, normal Lips and Perioral Area: None, normal Jaw: None, normal Tongue: None, normal,Extremity Movements Upper (arms, wrists, hands, fingers): None, normal Lower (legs, knees, ankles, toes): None, normal, Trunk Movements Neck, shoulders, hips: None, normal, Overall Severity Severity of abnormal movements (highest score from questions above): None, normal Incapacitation due to abnormal movements: None, normal Patient's awareness of abnormal movements (rate only patient's report): No Awareness, Dental Status Current problems with teeth and/or dentures?: No Does patient usually wear dentures?: No  CIWA:    COWS:     Musculoskeletal: Strength & Muscle Tone: within normal limits Gait & Station: normal Patient leans: N/A  Psychiatric Specialty Exam:  Presentation  General Appearance:  Appropriate for Environment  Eye Contact: Good  Speech: Clear and Coherent  Speech Volume: Normal  Handedness: -- (unknown)   Mood and Affect  Mood: Depressed  Affect: Congruent   Thought Process  Thought Processes: Coherent; Goal Directed; Linear  Descriptions of Associations:Intact  Orientation:Full (Time, Place and Person)  Thought  Content:Logical  History of Schizophrenia/Schizoaffective disorder:No data recorded Duration of Psychotic Symptoms:No data recorded Hallucinations:No data recorded Ideas of Reference:None  Suicidal Thoughts:No data recorded Homicidal Thoughts:No data recorded  Sensorium  Memory: Immediate Fair; Recent Fair  Judgment: Fair  Insight: Good   Executive Functions  Concentration: Good  Attention Span: Good  Recall: Good  Fund of Knowledge: Good  Language: Good   Psychomotor Activity  Psychomotor Activity:No data recorded  Assets  Assets: Communication Skills; Leisure Time; Resilience   Sleep  Sleep:No data recorded   Physical Exam: Physical Exam Vitals and nursing note reviewed.  Constitutional:      Appearance: Normal appearance.  HENT:     Head: Normocephalic and atraumatic.     Mouth/Throat:     Pharynx: Oropharynx is clear.  Eyes:     Pupils: Pupils are equal, round, and reactive to light.  Cardiovascular:     Rate and Rhythm: Normal rate and regular rhythm.  Pulmonary:     Effort: Pulmonary effort is normal.     Breath sounds: Normal breath sounds.  Abdominal:     General: Abdomen is flat.     Palpations: Abdomen is soft.  Musculoskeletal:        General: Normal range of motion.  Skin:    General: Skin is warm and dry.  Neurological:     General: No focal deficit present.     Mental Status: He is alert. Mental status is at baseline.  Psychiatric:        Mood and Affect: Mood normal.        Thought Content: Thought content normal.    Review of Systems  Constitutional: Negative.   HENT: Negative.    Eyes: Negative.   Respiratory: Negative.  Cardiovascular: Negative.   Gastrointestinal: Negative.   Musculoskeletal: Negative.   Skin: Negative.   Neurological: Negative.   Psychiatric/Behavioral: Negative.     Blood pressure 122/81, pulse 100, temperature 98.6 F (37 C), resp. rate 18, height 5\' 10"  (1.778 m), weight 97.5 kg,  SpO2 100 %. Body mass index is 30.85 kg/m.   Treatment Plan Summary: Medication management and Plan stabilizing and probably ready for discharge as soon as a plan can be obtained.  I told him it was fine with me if he used his phone and I will pass the message on to nursing.  No other change to treatment for today.  Alethia Berthold, MD 01/21/2023, 10:07 AM

## 2023-01-21 NOTE — Group Note (Signed)
Date:  01/21/2023 Time:  10:06 AM  Group Topic/Focus:  Goals Group:   The focus of this group is to help patients establish daily goals to achieve during treatment and discuss how the patient can incorporate goal setting into their daily lives to aide in recovery.  Community Group   Participation Level:  Did Not Attend  Woodson Macha A Sanari Offner 01/21/2023, 10:06 AM

## 2023-01-21 NOTE — Progress Notes (Signed)
D- Patient alert and oriented x 4. Affect flat/mood irritated. States he  just feels "bad, hurts all over". Encouraged to get out of bed and his room and participate in activities. Denies SI/HI/AVH. He denies pain. At supper he got agitated because he had not filled out his menu and was boisterous in the millieu. Refused to have staff reorder his supper. A- Scheduled AM medications administered to patient at lunchtime, per MD orders. Support and encouragement provided.  Routine safety checks conducted every 15 minutes with out incident. Patient informed to notify staff with problems or concerns and verbalizes understanding R- No adverse drug reactions noted. Although irritable patient compliant with medications and treatment plan. Patient isolative to his room except for meals and refused supper.He verbally contracts for safety and remains safe on the unit at this time.

## 2023-01-21 NOTE — BH IP Treatment Plan (Signed)
Interdisciplinary Treatment and Diagnostic Plan Update  01/21/2023 Time of Session: 0830 Cory Floyd MRN: [id number]  Principal Diagnosis: Bipolar 1 disorder  Secondary Diagnoses: Principal Problem:   Bipolar 1 disorder ([place]) Active Problems:   Gastroesophageal reflux disease without esophagitis   Cocaine abuse with cocaine-induced mood disorder (HCC)   Amphetamine abuse ([place])   Cellulitis   Rupture of operation wound   Current Medications:  Current Facility-Administered Medications  Medication Dose Route Frequency Provider Last Rate Last Admin   acetaminophen (TYLENOL) tablet 650 mg  650 mg Oral Q6H PRN [name], [name]   650 mg at 01/04/23 0931   alum & mag hydroxide-simeth (MAALOX/MYLANTA) 200-200-20 MG/5ML suspension 30 mL  30 mL Oral Q4H PRN [name], [name]       ARIPiprazole (ABILIFY) tablet 10 mg  10 mg Oral Daily [name], [name]   10 mg at 01/21/23 1240   cloNIDine (CATAPRES) tablet 0.2 mg  0.2 mg Oral Q6H PRN [name], DO       diphenhydrAMINE (BENADRYL) capsule 50 mg  50 mg Oral TID PRN [name], [name]       Or   diphenhydrAMINE (BENADRYL) injection 50 mg  50 mg Intramuscular TID PRN [name], [name]       divalproex (DEPAKOTE ER) 24 hr tablet 500 mg  500 mg Oral BID Clapacs, [name], MD   500 mg at 01/21/23 1728   fluticasone (FLONASE) 50 MCG/ACT nasal spray 1 spray  1 spray Each Nare Daily [name], [name]   1 spray at [id number]   folic acid (FOLVITE) tablet 1 mg  1 mg Oral Daily [name], [name]   1 mg at 01/21/23 1240   haloperidol (HALDOL) tablet 5 mg  5 mg Oral TID PRN [name], [name]       Or   haloperidol lactate (HALDOL) injection 5 mg  5 mg Intramuscular TID PRN [name], [name]       ibuprofen (ADVIL) tablet 600 mg  600 mg Oral Q6H PRN Clapacs, [name], MD       ipratropium (ATROVENT) nebulizer solution 0.5 mg  0.5 mg Nebulization BID PRN Clapacs, [name], MD       losartan (COZAAR)  tablet 50 mg  50 mg Oral Daily [name], DO   50 mg at 01/21/23 1240   magnesium hydroxide (MILK OF MAGNESIA) suspension 30 mL  30 mL Oral Daily PRN [name], [name]       metoprolol succinate (TOPROL-XL) 24 hr tablet 50 mg  50 mg Oral Daily [name], DO   50 mg at 01/21/23 1240   mirtazapine (REMERON) tablet 15 mg  15 mg Oral QHS [name], [name]   15 mg at 01/20/23 2345   multivitamin with minerals tablet 1 tablet  1 tablet Oral Daily [name], [name]   1 tablet at 01/21/23 1241   thiamine (VITAMIN B1) tablet 100 mg  100 mg Oral Daily [name], [name]   100 mg at 01/21/23 1239   traZODone (DESYREL) tablet 150 mg  150 mg Oral QHS PRN [name], DO   150 mg at 01/20/23 2345   PTA Medications: Medications Prior to Admission  Medication Sig Dispense Refill Last Dose   ARIPiprazole (ABILIFY) 10 MG tablet Take 1 tablet (10 mg total) by mouth daily. (Patient not taking: Reported on 12/27/2022) 30 tablet 0 Not Taking   benzonatate (TESSALON) 100 MG capsule Take 1 capsule (100 mg total)  by mouth 3 (three) times daily as needed for cough. (Patient not taking: Reported on 12/31/2022) 20 capsule 0 Not Taking   divalproex (DEPAKOTE ER) 500 MG 24 hr tablet Take 2 tablets (1,000 mg total) by mouth at bedtime. (Patient not taking: Reported on 12/27/2022) 60 tablet 0 Not Taking   ipratropium (ATROVENT HFA) 17 MCG/ACT inhaler Inhale 2 puffs into the lungs every 6 (six) hours as needed for wheezing. (Patient not taking: Reported on 12/31/2022) 1 each 12 Not Taking   mirtazapine (REMERON) 15 MG tablet Take 1 tablet (15 mg total) by mouth at bedtime. For depression/sleep (Patient not taking: Reported on 12/27/2022) 30 tablet 0 Not Taking    Patient Stressors: Financial difficulties   Health problems   Legal issue   Medication change or noncompliance   Substance abuse    Patient Strengths: Ability for insight  Average or above average intelligence   Communication skills   Treatment Modalities: Medication Management, Group therapy, Case management,  1 to 1 session with clinician, Psychoeducation, Recreational therapy.   Physician Treatment Plan for Primary Diagnosis: Bipolar 1 disorder Long Term Goal(s): Improvement in symptoms so as ready for discharge   Short Term Goals: Ability to identify changes in lifestyle to reduce recurrence of condition will improve Ability to verbalize feelings will improve Ability to disclose and discuss suicidal ideas Ability to demonstrate self-control will improve Ability to identify and develop effective coping behaviors will improve Ability to maintain clinical measurements within normal limits will improve Compliance with prescribed medications will improve Ability to identify triggers associated with substance abuse/mental health issues will improve  Medication Management: Evaluate patient's response, side effects, and tolerance of medication regimen.  Therapeutic Interventions: 1 to 1 sessions, Unit Group sessions and Medication administration.  Evaluation of Outcomes: Progressing  Physician Treatment Plan for Secondary Diagnosis: Principal Problem:   Bipolar 1 disorder ([place]) Active Problems:   Gastroesophageal reflux disease without esophagitis   Cocaine abuse with cocaine-induced mood disorder (HCC)   Amphetamine abuse (HCC)   Cellulitis   Rupture of operation wound  Long Term Goal(s): Improvement in symptoms so as ready for discharge   Short Term Goals: Ability to identify changes in lifestyle to reduce recurrence of condition will improve Ability to verbalize feelings will improve Ability to disclose and discuss suicidal ideas Ability to demonstrate self-control will improve Ability to identify and develop effective coping behaviors will improve Ability to maintain clinical measurements within normal limits will improve Compliance with prescribed medications will improve Ability  to identify triggers associated with substance abuse/mental health issues will improve     Medication Management: Evaluate patient's response, side effects, and tolerance of medication regimen.  Therapeutic Interventions: 1 to 1 sessions, Unit Group sessions and Medication administration.  Evaluation of Outcomes: Progressing   RN Treatment Plan for Primary Diagnosis: Bipolar 1 disorder Long Term Goal(s): Knowledge of disease and therapeutic regimen to maintain health will improve  Short Term Goals: Ability to remain free from injury will improve, Ability to verbalize frustration and anger appropriately will improve, Ability to demonstrate self-control, Ability to participate in decision making will improve, Ability to verbalize feelings will improve, Ability to disclose and discuss suicidal ideas, Ability to identify and develop effective coping behaviors will improve, and Compliance with prescribed medications will improve  Medication Management: RN will administer medications as ordered by provider, will assess and evaluate patient's response and provide education to patient for prescribed medication. RN will report any adverse and/or side effects to prescribing provider.  Therapeutic Interventions: 1 on 1 counseling sessions, Psychoeducation, Medication administration, Evaluate responses to treatment, Monitor vital signs and CBGs as ordered, Perform/monitor CIWA, COWS, AIMS and Fall Risk screenings as ordered, Perform wound care treatments as ordered.  Evaluation of Outcomes: Progressing   LCSW Treatment Plan for Primary Diagnosis: Bipolar 1 disorder Long Term Goal(s): Safe transition to appropriate next level of care at discharge, Engage patient in therapeutic group addressing interpersonal concerns.  Short Term Goals: Engage patient in aftercare planning with referrals and resources, Increase social support, Increase ability to appropriately verbalize feelings, Increase emotional  regulation, Facilitate acceptance of mental health diagnosis and concerns, Facilitate patient progression through stages of change regarding substance use diagnoses and concerns, Identify triggers associated with mental health/substance abuse issues, and Increase skills for wellness and recovery  Therapeutic Interventions: Assess for all discharge needs, 1 to 1 time with Social worker, Explore available resources and support systems, Assess for adequacy in community support network, Educate family and significant other(s) on suicide prevention, Complete Psychosocial Assessment, Interpersonal group therapy.  Evaluation of Outcomes: Progressing   Progress in Treatment: Attending groups: No. Participating in groups: No. Taking medication as prescribed: Yes. Toleration medication: Yes. Family/Significant other contact made: No, will contact:  Patient refused consent for CSW to reach family/friend.  Patient understands diagnosis: Yes. Discussing patient identified problems/goals with staff: Yes. Medical problems stabilized or resolved: Yes. Denies suicidal/homicidal ideation: No. Issues/concerns per patient self-inventory: Yes. Other: none  New problem(s) identified: No, Describe:  none  New Short Term/Long Term Goal(s): Patient to work towards detox, medication management for mood stabilization; elimination of SI thoughts; development of comprehensive mental wellness/sobriety plan  Patient Goals:  No additional goals identified at this time. Patient to continue to work towards original goals identified in initial treatment team meeting. CSW will remain available to patient should they voice additional treatment goals.   Discharge Plan or Barriers: Patient is homeless, CSW has discussed available housing resources with patient.   Reason for Continuation of Hospitalization: Medication stabilization Suicidal ideation  Estimated Length of Stay: 1-7 days    Scribe for Treatment  Team: Larose Kells 01/21/2023 10:13 PM

## 2023-01-21 NOTE — Progress Notes (Signed)
   01/20/23 2300  Psych Admission Type (Psych Patients Only)  Admission Status Voluntary  Psychosocial Assessment  Patient Complaints None  Eye Contact Brief  Facial Expression Flat  Affect Irritable  Speech Logical/coherent  Interaction Isolative  Motor Activity Slow  Appearance/Hygiene Disheveled  Behavior Characteristics Appropriate to situation  Mood Irritable  Thought Process  Coherency WDL  Content WDL  Delusions None reported or observed  Perception WDL  Hallucination None reported or observed  Judgment Limited  Confusion None  Danger to Self  Current suicidal ideation? Denies  Danger to Others  Danger to Others None reported or observed

## 2023-01-21 NOTE — Psychosocial Assessment (Signed)
[place] Group Notes:  (Nursing/MHT/Case Management/Adjunct)  Date:  01/21/2023  Time:  2:51 AM  Type of Therapy:  Group Therapy  Participation Level:  None  Participation Quality:  Inattentive  Affect:  Flat  Cognitive:  Lacking  Insight:  Appropriate  Engagement in Group:  Lacking  Modes of Intervention:  Discussion  Summary of Progress/Problems:  Cory Floyd 01/21/2023, 2:51 AM

## 2023-01-21 NOTE — Plan of Care (Signed)

## 2023-01-22 ENCOUNTER — Other Ambulatory Visit: Payer: Self-pay

## 2023-01-22 MED ORDER — TRAZODONE HCL 150 MG PO TABS: 150.0000 mg | ORAL_TABLET | Freq: Every evening | ORAL | 1 refills | Status: DC | PRN

## 2023-01-22 MED ORDER — LOSARTAN POTASSIUM 50 MG PO TABS: 50.0000 mg | ORAL_TABLET | Freq: Every day | ORAL | 1 refills | Status: DC

## 2023-01-22 MED ORDER — FLUTICASONE PROPIONATE 50 MCG/ACT NA SUSP: 1.0000 | Freq: Every day | NASAL | 1 refills | Status: DC

## 2023-01-22 MED ORDER — ARIPIPRAZOLE 10 MG PO TABS: 10.0000 mg | ORAL_TABLET | Freq: Every day | ORAL | 1 refills | Status: DC

## 2023-01-22 MED ORDER — DIVALPROEX SODIUM ER 500 MG PO TB24: 500.0000 mg | ORAL_TABLET | Freq: Two times a day (BID) | ORAL | 1 refills | Status: DC

## 2023-01-22 MED ORDER — MIRTAZAPINE 15 MG PO TABS
15.0000 mg | ORAL_TABLET | Freq: Every day | ORAL | 0 refills | Status: DC
  Filled 2023-01-22: qty 30, 30d supply, fill #0

## 2023-01-22 MED ORDER — MIRTAZAPINE 15 MG PO TABS: 15.0000 mg | ORAL_TABLET | Freq: Every day | ORAL | 1 refills | Status: DC

## 2023-01-22 MED ORDER — METOPROLOL SUCCINATE ER 50 MG PO TB24: 50.0000 mg | ORAL_TABLET | Freq: Every day | ORAL | 1 refills | Status: DC

## 2023-01-22 MED ORDER — ARIPIPRAZOLE 10 MG PO TABS
10.0000 mg | ORAL_TABLET | Freq: Every day | ORAL | 0 refills | Status: DC
  Filled 2023-01-22: qty 30, 30d supply, fill #0

## 2023-01-22 MED ORDER — LOSARTAN POTASSIUM 50 MG PO TABS
50.0000 mg | ORAL_TABLET | Freq: Every day | ORAL | 0 refills | Status: DC
  Filled 2023-01-22: qty 30, 30d supply, fill #0

## 2023-01-22 MED ORDER — FLUTICASONE PROPIONATE 50 MCG/ACT NA SUSP
1.0000 | Freq: Every day | NASAL | 0 refills | Status: DC
  Filled 2023-01-22: qty 16, 30d supply, fill #0

## 2023-01-22 MED ORDER — METOPROLOL SUCCINATE ER 50 MG PO TB24
50.0000 mg | ORAL_TABLET | Freq: Every day | ORAL | 0 refills | Status: DC
  Filled 2023-01-22: qty 30, 30d supply, fill #0

## 2023-01-22 MED ORDER — DIVALPROEX SODIUM ER 500 MG PO TB24
500.0000 mg | ORAL_TABLET | Freq: Two times a day (BID) | ORAL | 0 refills | Status: DC
  Filled 2023-01-22: qty 60, 30d supply, fill #0

## 2023-01-22 MED ORDER — TRAZODONE HCL 150 MG PO TABS
150.0000 mg | ORAL_TABLET | Freq: Every evening | ORAL | 0 refills | Status: DC | PRN
  Filled 2023-01-22: qty 30, 30d supply, fill #0

## 2023-01-22 NOTE — Group Note (Signed)
[facility] LCSW Group Therapy Note    Group Date: 01/22/2023 Start Time: 1300 End Time: 1400  Type of Therapy and Topic:  Group Therapy:  Overcoming Obstacles  Participation Level:  [facility] PARTICIPATION LEVEL: Minimal   Description of Group:   In this group patients will be encouraged to explore what they see as obstacles to their own wellness and recovery. They will be guided to discuss their thoughts, feelings, and behaviors related to these obstacles. The group will process together ways to cope with barriers, with attention given to specific choices patients can make. Each patient will be challenged to identify changes they are motivated to make in order to overcome their obstacles. This group will be process-oriented, with patients participating in exploration of their own experiences as well as giving and receiving support and challenge from other group members.  Therapeutic Goals: 1. Patient will identify personal and current obstacles as they relate to admission. 2. Patient will identify barriers that currently interfere with their wellness or overcoming obstacles.  3. Patient will identify feelings, thought process and behaviors related to these barriers. 4. Patient will identify two changes they are willing to make to overcome these obstacles:    Summary of Patient Progress Patient was present for the majority of the group but was in and out dealing with discharge planning. He shared that the witnessing his mother die and not being able to help her is one of the obstacles in his life. Pt explained that since then he has been in and out of the hospital and he cannot come back from it. Drugs were identified as a barrier to him overcoming this obstacle. Pt talks about his use as a way to ignore/avoid these feelings and others in general. Insight into the topic is questionable. He appeared open and receptive to feedback/comments from both peers and  facilitator.    Therapeutic Modalities:   Cognitive Behavioral Therapy Solution Focused Therapy Motivational Interviewing Relapse Prevention Therapy   Shirl Harris, LCSW

## 2023-01-22 NOTE — Group Note (Signed)
Recreation Therapy Group Note   Group Topic:Goal Setting  Group Date: 01/22/2023 Start Time: 1000 End Time: 1100 Facilitators: Vilma Prader, LRT, CTRS Location:  Craft Room  Group Description: Scientist, physiological. Patients were given many different magazines, a glue stick, markers, and a piece of cardstock paper. LRT and pts discussed the importance of having goals in life. LRT and pts discussed the difference between short-term and long-term goals. LRT encouraged pts to create a vision board, with images they picked and then cut out by LRT from the magazine, for themselves, that capture their short and long-term goals. On the back of the paper, pt encouraged to write 3 different coping skills that can help them reach those goals. LRT encouraged pts to show and explain their vision board to the group. LRT offered to laminate vision board once dry and complete.   Affect/Mood: Blunted and Flat   Participation Level: Minimal   Participation Quality: Moderate Cues   Behavior: Appropriate and Defiant   Speech/Thought Process: Coherent   Insight: Limited   Judgement: Limited   Modes of Intervention: Activity   Patient Response to Interventions:  Disengaged   Education Outcome:  In group clarification offered    Clinical Observations/Individualized Feedback: Junaid was not active in their participation of session activities and group discussion. Pt was present in the craft room for 30 minutes before leaving. H shared that he did not want to do the activity planned. LRT asked "how come?" And pt responded with "I am 47 years old, I have done enough in my life that I can do what I want. Plus, I am leaving today." Pt sat talking with peers while in the craft room. Conversation was appropriate.    Plan: Continue to engage patient in RT group sessions 2-3x/week.   Vilma Prader, LRT, CTRS 01/22/2023 11:48 AM

## 2023-01-22 NOTE — Progress Notes (Signed)
  [facility] Adult Case Management Discharge Plan :  Will you be returning to the same living situation after discharge:  No. At discharge, do you have transportation home?: Yes,  Patient choose to take bus from hospital, shuttle service to drop patient off at buss stop Patient will be using the bus for transportation home. Provided with [employer] bus pass; has been provided with bus schedule/map. Patient has expressed they are confident in navigating bus system.  Do you have the ability to pay for your medications: Yes,  Medicare Advantage   Release of information consent forms completed and in the chart;  Patient's signature needed at discharge.  Patient to Follow up at:  [place]. Go to.   Specialty: Behavioral Health Why: Please present for new patient walk in clinic Mon-Wed at [facility] operates on a first come first serve basis and begins seeing patients at [phone]. Contact information: [place] [phone]                Next level of care provider has access to [place] and Suicide Prevention discussed: Yes,  SPE completed with patient by nursing staff, declined consent for CSW to reach family/friend.    Has patient been referred to the Quitline?: Patient refused referral Tobacco Use: High Risk (12/30/2022)   Patient History    Smoking Tobacco Use: Every Day    Smokeless Tobacco Use: Never    Passive Exposure: Not on file   Patient has been referred for addiction treatment: Pt. refused referral Social History   Substance and Sexual Activity  Drug Use Yes   Types: Cocaine, Marijuana   Comment: heroin   Social History   Substance and Sexual Activity  Alcohol Use Yes   Comment: 1/2-5th pint of liquor   [name], LCSWA 01/22/2023, 1:32 PM

## 2023-01-22 NOTE — BHH Suicide Risk Assessment (Signed)
[facility] Discharge Suicide Risk Assessment   Principal Problem: Bipolar 1 disorder Discharge Diagnoses: Principal Problem:   Bipolar 1 disorder Active Problems:   Gastroesophageal reflux disease without esophagitis   Cocaine abuse with cocaine-induced mood disorder   Amphetamine abuse   Cellulitis   Rupture of operation wound   Total Time spent with patient: 30 minutes  Musculoskeletal: Strength & Muscle Tone: within normal limits Gait & Station: normal Patient leans: N/A  Psychiatric Specialty Exam  Presentation  General Appearance:  Appropriate for Environment  Eye Contact: Good  Speech: Clear and Coherent  Speech Volume: Normal  Handedness: -- (unknown)   Mood and Affect  Mood: Depressed  Duration of Depression Symptoms: No data recorded Affect: Congruent   Thought Process  Thought Processes: Coherent; Goal Directed; Linear  Descriptions of Associations:Intact  Orientation:Full (Time, Place and Person)  Thought Content:Logical  History of Schizophrenia/Schizoaffective disorder:No data recorded Duration of Psychotic Symptoms:No data recorded Hallucinations:No data recorded Ideas of Reference:None  Suicidal Thoughts:No data recorded Homicidal Thoughts:No data recorded  Sensorium  Memory: Immediate Fair; Recent Fair  Judgment: Fair  Insight: Good   Executive Functions  Concentration: Good  Attention Span: Good  Recall: Good  Fund of Knowledge: Good  Language: Good   Psychomotor Activity  Psychomotor Activity:No data recorded  Assets  Assets: Communication Skills; Leisure Time; Resilience   Sleep  Sleep:No data recorded  Physical Exam: Physical Exam Vitals and nursing note reviewed.  Constitutional:      Appearance: Normal appearance.  HENT:     Head: Normocephalic and atraumatic.     Mouth/Throat:     Pharynx: Oropharynx is clear.  Eyes:     Pupils: Pupils are equal, round, and reactive to light.   Cardiovascular:     Rate and Rhythm: Normal rate and regular rhythm.  Pulmonary:     Effort: Pulmonary effort is normal.     Breath sounds: Normal breath sounds.  Abdominal:     General: Abdomen is flat.     Palpations: Abdomen is soft.  Musculoskeletal:        General: Normal range of motion.  Skin:    General: Skin is warm and dry.  Neurological:     General: No focal deficit present.     Mental Status: He is alert. Mental status is at baseline.  Psychiatric:        Attention and Perception: Attention normal.        Mood and Affect: Mood normal.        Speech: Speech normal.        Behavior: Behavior normal.        Thought Content: Thought content normal.        Cognition and Memory: Cognition normal.        Judgment: Judgment normal.    Review of Systems  Constitutional: Negative.   HENT: Negative.    Eyes: Negative.   Respiratory: Negative.    Cardiovascular: Negative.   Gastrointestinal: Negative.   Musculoskeletal: Negative.   Skin: Negative.   Neurological: Negative.   Psychiatric/Behavioral: Negative.     Blood pressure 107/68, pulse 67, temperature 98.6 F (37 C), resp. rate 18, height 5\' 10"  (1.778 m), weight 97.5 kg, SpO2 100 %. Body mass index is 30.85 kg/m.  Mental Status Per Nursing Assessment::   On Admission:  Suicidal ideation indicated by others  Demographic Factors:  Male and Living alone  Loss Factors: Financial problems/change in socioeconomic status  Historical Factors: Impulsivity  Risk Reduction Factors:  Positive coping skills or problem solving skills  Continued Clinical Symptoms:  Depression:   Impulsivity Alcohol/Substance Abuse/Dependencies  Cognitive Features That Contribute To Risk:  None    Suicide Risk:  Minimal: No identifiable suicidal ideation.  Patients presenting with no risk factors but with morbid ruminations; may be classified as minimal risk based on the severity of the depressive symptoms    Plan Of  Care/Follow-up recommendations:  Other:  Patient will be discharged with prescriptions for medication and referred for outpatient treatment in the community.  He denies any suicidal ideation and reports his mood is better.  He is showing a positive outlook with good insight.  Strongly encouraged to engage in treatment to stop cocaine use.  Alethia Berthold, MD 01/22/2023, 9:49 AM

## 2023-01-22 NOTE — Discharge Summary (Signed)
Physician Discharge Summary Note  Patient:  Cory Floyd is an 47 y.o., male MRN:  [id number] DOB:  19-Apr-1976 Patient phone:  [phone] (home)  Patient address:   [address] [place] [address],  Total Time spent with patient: 30 minutes  Date of Admission:  12/30/2022 Date of Discharge: 01/22/2023  Reason for Admission: Patient was admitted with suicidal ideation depression and substance abuse homelessness multiple social stresses  Principal Problem: Bipolar 1 disorder Discharge Diagnoses: Principal Problem:   Bipolar 1 disorder Active Problems:   Gastroesophageal reflux disease without esophagitis   Cocaine abuse with cocaine-induced mood disorder   Amphetamine abuse   Cellulitis   Rupture of operation wound   Past Psychiatric History: History of mood instability and polysubstance abuse  Past Medical History:  Past Medical History:  Diagnosis Date   Alcoholic ([place])    clean for 3 years   Anginal pain ([place])    Arthritis    Bipolar 1 disorder ([place])    COVID-19    Depressed    Diabetes mellitus without complication ([place])    Dyspnea    Fatty liver    Hypertension    Schizophrenia ([place])     Past Surgical History:  Procedure Laterality Date   COLONOSCOPY  02/05/2014   diverticulosis, hyperplastic polyp x 2   fracture arm Right    INCISION AND DRAINAGE ABSCESS Bilateral 12/29/2022   Procedure: INCISION AND DRAINAGE ABSCESS BILATERAL ARMS;  Surgeon: Shona Needles, MD;  Location: [place];  Service: Orthopedics;  Laterality: Bilateral;   KNEE SURGERY Right 2013   NASAL SEPTUM SURGERY     NASAL TURBINATE REDUCTION Bilateral 12/12/2016   Procedure: TURBINATE REDUCTION/SUBMUCOSAL RESECTION;  Surgeon: Beverly Gust, MD;  Location: [facility];  Service: ENT;  Laterality: Bilateral;   Family History:  Family History  Problem Relation Age of Onset   Hepatitis C Mother    Cancer Mother    Cancer Maternal Aunt    Hypertension Maternal Grandmother    Family  Psychiatric  History: See previous Social History:  Social History   Substance and Sexual Activity  Alcohol Use Yes   Comment: 1/2-5th pint of liquor     Social History   Substance and Sexual Activity  Drug Use Yes   Types: Cocaine, Marijuana   Comment: heroin    Social History   Socioeconomic History   Marital status: Divorced    Spouse name: Not on file   Number of children: Not on file   Years of education: Not on file   Highest education level: Not on file  Occupational History   Not on file  Tobacco Use   Smoking status: Every Day    Packs/day: 1.00    Years: 20.00    Additional pack years: 0.00    Total pack years: 20.00    Types: Cigarettes   Smokeless tobacco: Never  Vaping Use   Vaping Use: Never used  Substance and Sexual Activity   Alcohol use: Yes    Comment: 1/2-5th pint of liquor   Drug use: Yes    Types: Cocaine, Marijuana    Comment: heroin   Sexual activity: Yes    Birth control/protection: None  Other Topics Concern   Not on file  Social History Narrative   Not on file   Social Determinants of Health   Financial Resource Strain: Not on file  Food Insecurity: Food Insecurity Present (12/30/2022)   Hunger Vital Sign    Worried About Running Out of  Food in the Last Year: Often true    Ran Out of Food in the Last Year: Often true  Transportation Needs: Unmet Transportation Needs (12/30/2022)   PRAPARE - Hydrologist (Medical): Yes    Lack of Transportation (Non-Medical): Yes  Physical Activity: Not on file  Stress: Not on file  Social Connections: Not on file    Hospital Course: Admitted to psychiatric unit.  15-minute checks continued.  Treated for depression with Abilify as previously done.  Blood pressure controlled.  Patient had bilateral wounds in his antecubital fossa from drainage of abscesses that required dressing and antibiotics.  Antibiotic course has been completed wounds of healed up sutures have been  removed.  Patient's affect is upbeat and mood is stated as good at discharge.  No evidence of disorganized thinking or psychosis.  Denies suicidal ideation.  Agrees to recommended outpatient treatment.  Patient confirmed that he does have some money available and will be going to stay at a local room to rent when he can find 1.  Physical Findings: AIMS: Facial and Oral Movements Muscles of Facial Expression: None, normal Lips and Perioral Area: None, normal Jaw: None, normal Tongue: None, normal,Extremity Movements Upper (arms, wrists, hands, fingers): None, normal Lower (legs, knees, ankles, toes): None, normal, Trunk Movements Neck, shoulders, hips: None, normal, Overall Severity Severity of abnormal movements (highest score from questions above): None, normal Incapacitation due to abnormal movements: None, normal Patient's awareness of abnormal movements (rate only patient's report): No Awareness, Dental Status Current problems with teeth and/or dentures?: No Does patient usually wear dentures?: No  CIWA:    COWS:     Musculoskeletal: Strength & Muscle Tone: within normal limits Gait & Station: normal Patient leans: N/A   Psychiatric Specialty Exam:  Presentation  General Appearance:  Appropriate for Environment  Eye Contact: Good  Speech: Clear and Coherent  Speech Volume: Normal  Handedness: -- (unknown)   Mood and Affect  Mood: Depressed  Affect: Congruent   Thought Process  Thought Processes: Coherent; Goal Directed; Linear  Descriptions of Associations:Intact  Orientation:Full (Time, Place and Person)  Thought Content:Logical  History of Schizophrenia/Schizoaffective disorder:No data recorded Duration of Psychotic Symptoms:No data recorded Hallucinations:No data recorded Ideas of Reference:None  Suicidal Thoughts:No data recorded Homicidal Thoughts:No data recorded  Sensorium  Memory: Immediate Fair; Recent  Fair  Judgment: Fair  Insight: Good   Executive Functions  Concentration: Good  Attention Span: Good  Recall: Good  Fund of Knowledge: Good  Language: Good   Psychomotor Activity  Psychomotor Activity:No data recorded  Assets  Assets: Communication Skills; Leisure Time; Resilience   Sleep  Sleep:No data recorded   Physical Exam: Physical Exam Vitals and nursing note reviewed.  Constitutional:      Appearance: Normal appearance.  HENT:     Head: Normocephalic and atraumatic.     Mouth/Throat:     Pharynx: Oropharynx is clear.  Eyes:     Pupils: Pupils are equal, round, and reactive to light.  Cardiovascular:     Rate and Rhythm: Normal rate and regular rhythm.  Pulmonary:     Effort: Pulmonary effort is normal.     Breath sounds: Normal breath sounds.  Abdominal:     General: Abdomen is flat.     Palpations: Abdomen is soft.  Musculoskeletal:        General: Normal range of motion.  Skin:    General: Skin is warm and dry.  Neurological:     General:  No focal deficit present.     Mental Status: He is alert. Mental status is at baseline.  Psychiatric:        Attention and Perception: Attention normal.        Mood and Affect: Mood normal.        Speech: Speech normal.        Behavior: Behavior is cooperative.        Thought Content: Thought content normal.        Cognition and Memory: Cognition normal.        Judgment: Judgment normal.    Review of Systems  Constitutional: Negative.   HENT: Negative.    Eyes: Negative.   Respiratory: Negative.    Cardiovascular: Negative.   Gastrointestinal: Negative.   Musculoskeletal: Negative.   Skin: Negative.   Neurological: Negative.   Psychiatric/Behavioral: Negative.     Blood pressure 107/68, pulse 67, temperature 98.6 F (37 C), resp. rate 18, height 5\' 10"  (1.778 m), weight 97.5 kg, SpO2 100 %. Body mass index is 30.85 kg/m.   Social History   Tobacco Use  Smoking Status Every Day    Packs/day: 1.00   Years: 20.00   Additional pack years: 0.00   Total pack years: 20.00   Types: Cigarettes  Smokeless Tobacco Never   Tobacco Cessation:  A prescription for an FDA-approved tobacco cessation medication was offered at discharge and the patient refused   Blood Alcohol level:  Lab Results  Component Value Date   [facility] <10 12/19/2022   ETH <10 [id number]    Metabolic Disorder Labs:  Lab Results  Component Value Date   HGBA1C 5.8 (H) 12/31/2022   MPG 120 12/31/2022   MPG 111 04/02/2021   No results found for: "PROLACTIN" Lab Results  Component Value Date   CHOL 149 12/31/2022   TRIG 160 (H) 12/31/2022   HDL 26 (L) 12/31/2022   CHOLHDL 5.7 12/31/2022   VLDL 32 12/31/2022   LDLCALC 91 12/31/2022   LDLCALC 122 (H) 05/04/2021    See Psychiatric Specialty Exam and Suicide Risk Assessment completed by Attending Physician prior to discharge.  Discharge destination:  Home  Is patient on multiple antipsychotic therapies at discharge:  No   Has Patient had three or more failed trials of antipsychotic monotherapy by history:  No  Recommended Plan for Multiple Antipsychotic Therapies: NA  Discharge Instructions     Diet - low sodium heart healthy   Complete by: As directed    Increase activity slowly   Complete by: As directed       Allergies as of 01/22/2023       Reactions   Carrot [daucus [place]] Anaphylaxis, Rash   Penicillins Anaphylaxis, Hives   Has patient had a PCN reaction causing immediate rash, facial/tongue/throat swelling, SOB or lightheadedness with hypotension: Yes Has patient had a PCN reaction causing severe rash involving mucus membranes or skin necrosis: No Has patient had a PCN reaction that required hospitalization Yes Has patient had a PCN reaction occurring within the last 10 years: No If all of the above answers are "NO", then may proceed with Cephalosporin use.   [place] [ampicillin] Anaphylaxis, Hives        Medication List      STOP taking these medications    Atrovent HFA 17 MCG/ACT inhaler Generic drug: ipratropium   benzonatate 100 MG capsule Commonly known as: TESSALON       TAKE these medications      Indication  ARIPiprazole 10 MG  tablet Commonly known as: ABILIFY Take 1 tablet (10 mg total) by mouth daily.  Indication: Major Depressive Disorder   divalproex 500 MG 24 hr tablet Commonly known as: DEPAKOTE ER Take 1 tablet (500 mg total) by mouth 2 (two) times daily. What changed:  how much to take when to take this  Indication: Depressive Phase of Manic-Depression   fluticasone 50 MCG/ACT nasal spray Commonly known as: FLONASE Place 1 spray into both nostrils daily. Start taking on: January 23, 2023  Indication: Stuffy Nose   losartan 50 MG tablet Commonly known as: COZAAR Take 1 tablet (50 mg total) by mouth daily. Start taking on: January 23, 2023  Indication: High Blood Pressure Disorder   metoprolol succinate 50 MG 24 hr tablet Commonly known as: TOPROL-XL Take 1 tablet (50 mg total) by mouth daily. Start taking on: January 23, 2023  Indication: High Blood Pressure Disorder   mirtazapine 15 MG tablet Commonly known as: REMERON Take 1 tablet (15 mg total) by mouth at bedtime. What changed: additional instructions  Indication: Major Depressive Disorder   traZODone 150 MG tablet Commonly known as: DESYREL Take 1 tablet (150 mg total) by mouth at bedtime as needed for sleep.  Indication: Trouble Sleeping         Follow-up recommendations:  Other:  Patient did not display any dangerous behavior in the hospital.  He has been calm and appropriate and now shows good insight and has the resources for discharge.  Discharged to local self-care and follow-up recommended at mental health treatment and strongly encouraged to stay sober.  Comments: See above  Signed: Alethia Berthold, MD 01/22/2023, 9:55 AM

## 2023-01-22 NOTE — Progress Notes (Signed)
Discharge note: Suicide safety plan refused and survey complete. RN met with pt and reviewed pt's discharge instructions. Pt verbalized understanding of discharge instructions and pt did not have any questions. RN reviewed and provided pt with a copy of SRA, AVS and Transition Record. RN returned pt's belongings to pt. Prescriptions and samples were given to pt. Pt denied SI/HI/AVH and voiced no concerns. Pt was appreciative of the care pt received at [facility]. Patient discharged to the lobby without incident.  01/22/23 [id number]  Psych Admission Type (Psych Patients Only)  Admission Status Voluntary  Psychosocial Assessment  Patient Complaints None  Eye Contact Fair  Facial Expression Animated  Affect Appropriate to circumstance  Speech Logical/coherent  Interaction Assertive  Motor Activity Slow  Appearance/Hygiene Unremarkable;In scrubs  Behavior Characteristics Cooperative;Appropriate to situation;Calm  Mood Pleasant  Aggressive Behavior  Effect No apparent injury  Thought Process  Coherency WDL  Content WDL  Delusions None reported or observed  Perception WDL  Hallucination None reported or observed  Judgment Impaired  Confusion None  Danger to Self  Current suicidal ideation? Denies  Danger to Others  Danger to Others None reported or observed

## 2023-01-22 NOTE — Psychosocial Assessment (Signed)
[facility] Notes:  (Nursing/MHT/Case Management/Adjunct)  Date:  01/22/2023  Time:  3:03 AM  Type of Therapy:  Group Therapy  Participation Level:  None  Participation Quality:  Attentive  Affect:  Blunted  Cognitive:  Lacking  Insight:  None  Engagement in Group:  None  Modes of Intervention:  Support  Summary of Progress/Problems:  Cherie Dark 01/22/2023, 3:03 AM

## 2023-01-22 NOTE — Care Management Important Message (Signed)
Important Message  Patient Details  Name: Cory Floyd MRN: [id number] Date of Birth: 05-Jul-1976   Medicare Important Message Given:  Yes  Patient informed of right to appeal discharge, provided phone number to [facility]. Patient expressed no interest in appealing discharge at this time. CSW will continue to monitor situation.  [name], LCSWA 01/22/2023, 1:35 PM

## 2023-01-22 NOTE — Progress Notes (Signed)
Pt is isolative to bedroom.  Pt slept through night with no complaints.  Pt denies SI Continued monitoring for safety.  01/22/23 0400  Psych Admission Type (Psych Patients Only)  Admission Status Voluntary  Psychosocial Assessment  Patient Complaints None  Eye Contact Brief  Facial Expression Flat  Affect Irritable  Speech Logical/coherent  Interaction Isolative  Motor Activity Slow  Appearance/Hygiene Disheveled  Behavior Characteristics Appropriate to situation  Mood Irritable  Thought Process  Coherency WDL  Content WDL  Delusions WDL  Perception WDL  Hallucination None reported or observed  Judgment Impaired  Confusion None  Danger to Self  Current suicidal ideation? Denies  Danger to Others  Danger to Others None reported or observed

## 2023-01-22 NOTE — Plan of Care (Signed)
  Problem: Education: Goal: Knowledge of General Education information will improve Description: Including pain rating scale, medication(s)/side effects and non-pharmacologic comfort measures Outcome: Adequate for Discharge   Problem: Nutrition: Goal: Adequate nutrition will be maintained Outcome: Adequate for Discharge   Problem: Coping: Goal: Level of anxiety will decrease Outcome: Adequate for Discharge   

## 2023-01-24 ENCOUNTER — Observation Stay (HOSPITAL_COMMUNITY)
Admission: EM | Admit: 2023-01-24 | Discharge: 2023-01-26 | Disposition: A | Discharge status: Home / Self Care | Payer: Medicare (Managed Care) | Attending: Family Medicine | Admitting: Family Medicine

## 2023-01-24 ENCOUNTER — Encounter (HOSPITAL_COMMUNITY): Payer: Self-pay | Admitting: Emergency Medicine

## 2023-01-24 DIAGNOSIS — E876 Hypokalemia: Secondary | ICD-10-CM | POA: Insufficient documentation

## 2023-01-24 DIAGNOSIS — N179 Acute kidney failure, unspecified: Secondary | ICD-10-CM | POA: Diagnosis not present

## 2023-01-24 DIAGNOSIS — F1910 Other psychoactive substance abuse, uncomplicated: Secondary | ICD-10-CM

## 2023-01-24 DIAGNOSIS — M6282 Rhabdomyolysis: Secondary | ICD-10-CM | POA: Diagnosis present

## 2023-01-24 DIAGNOSIS — Z79899 Other long term (current) drug therapy: Secondary | ICD-10-CM | POA: Diagnosis not present

## 2023-01-24 DIAGNOSIS — F1090 Alcohol use, unspecified, uncomplicated: Secondary | ICD-10-CM

## 2023-01-24 DIAGNOSIS — R7989 Other specified abnormal findings of blood chemistry: Secondary | ICD-10-CM | POA: Diagnosis not present

## 2023-01-24 DIAGNOSIS — E860 Dehydration: Secondary | ICD-10-CM | POA: Diagnosis present

## 2023-01-24 DIAGNOSIS — F1414 Cocaine abuse with cocaine-induced mood disorder: Secondary | ICD-10-CM | POA: Diagnosis present

## 2023-01-24 DIAGNOSIS — R443 Hallucinations, unspecified: Secondary | ICD-10-CM | POA: Diagnosis present

## 2023-01-24 DIAGNOSIS — F319 Bipolar disorder, unspecified: Secondary | ICD-10-CM | POA: Diagnosis present

## 2023-01-24 DIAGNOSIS — F1510 Other stimulant abuse, uncomplicated: Secondary | ICD-10-CM | POA: Diagnosis present

## 2023-01-24 DIAGNOSIS — F1920 Other psychoactive substance dependence, uncomplicated: Secondary | ICD-10-CM | POA: Diagnosis present

## 2023-01-24 DIAGNOSIS — Z7952 Long term (current) use of systemic steroids: Secondary | ICD-10-CM | POA: Insufficient documentation

## 2023-01-24 DIAGNOSIS — Z046 Encounter for general psychiatric examination, requested by authority: Secondary | ICD-10-CM

## 2023-01-24 NOTE — ED Triage Notes (Signed)
Pt was discharged from [facility] Monday. He then went and did cocaine and meth. He stayed up all night Monday and very little sleep tonight. He states voices are telling him "he is trouble and need to be dealth with". Hallucinations of shadows and lights coming on and off. Has meds but has not taken them. Pt denies SI and HI

## 2023-01-25 ENCOUNTER — Other Ambulatory Visit: Payer: Self-pay

## 2023-01-25 ENCOUNTER — Emergency Department (HOSPITAL_COMMUNITY): Payer: Medicare (Managed Care)

## 2023-01-25 DIAGNOSIS — F1920 Other psychoactive substance dependence, uncomplicated: Secondary | ICD-10-CM

## 2023-01-25 DIAGNOSIS — Z046 Encounter for general psychiatric examination, requested by authority: Secondary | ICD-10-CM

## 2023-01-25 DIAGNOSIS — N179 Acute kidney failure, unspecified: Secondary | ICD-10-CM | POA: Diagnosis not present

## 2023-01-25 DIAGNOSIS — F1090 Alcohol use, unspecified, uncomplicated: Secondary | ICD-10-CM

## 2023-01-25 DIAGNOSIS — F319 Bipolar disorder, unspecified: Secondary | ICD-10-CM

## 2023-01-25 DIAGNOSIS — F1510 Other stimulant abuse, uncomplicated: Secondary | ICD-10-CM

## 2023-01-25 DIAGNOSIS — M6282 Rhabdomyolysis: Secondary | ICD-10-CM | POA: Diagnosis present

## 2023-01-25 DIAGNOSIS — E860 Dehydration: Secondary | ICD-10-CM | POA: Diagnosis not present

## 2023-01-25 DIAGNOSIS — F1414 Cocaine abuse with cocaine-induced mood disorder: Secondary | ICD-10-CM

## 2023-01-25 LAB — COMPREHENSIVE METABOLIC PANEL
ALT: 55 U/L — ABNORMAL HIGH (ref 0–44)
AST: 94 U/L — ABNORMAL HIGH (ref 15–41)
Albumin: 4.5 g/dL (ref 3.5–5.0)
Alkaline Phosphatase: 69 U/L (ref 38–126)
Anion gap: 12 (ref 5–15)
BUN: 39 mg/dL — ABNORMAL HIGH (ref 6–20)
CO2: 21 mmol/L — ABNORMAL LOW (ref 22–32)
Calcium: 9.4 mg/dL (ref 8.9–10.3)
Chloride: 102 mmol/L (ref 98–111)
Creatinine, Ser: 1.8 mg/dL — ABNORMAL HIGH (ref 0.61–1.24)
GFR, Estimated: 46 mL/min — ABNORMAL LOW (ref >60)
Glucose, Bld: 156 mg/dL — ABNORMAL HIGH (ref 70–99)
Potassium: 3.4 mmol/L — ABNORMAL LOW (ref 3.5–5.1)
Sodium: 135 mmol/L (ref 135–145)
Total Bilirubin: 0.8 mg/dL (ref 0.3–1.2)
Total Protein: 8.4 g/dL — ABNORMAL HIGH (ref 6.5–8.1)

## 2023-01-25 LAB — URINALYSIS, W/ REFLEX TO CULTURE (INFECTION SUSPECTED)
Bilirubin Urine: NEGATIVE
Glucose, UA: NEGATIVE mg/dL
Hgb urine dipstick: NEGATIVE
Ketones, ur: NEGATIVE mg/dL
Leukocytes,Ua: NEGATIVE
Nitrite: NEGATIVE
Protein, ur: 30 mg/dL — AB
Specific Gravity, Urine: 1.027 (ref 1.005–1.030)
pH: 5 (ref 5.0–8.0)

## 2023-01-25 LAB — CBC
HCT: 42.1 % (ref 39.0–52.0)
Hemoglobin: 14 g/dL (ref 13.0–17.0)
MCH: 30.1 pg (ref 26.0–34.0)
MCHC: 33.3 g/dL (ref 30.0–36.0)
MCV: 90.5 fL (ref 80.0–100.0)
Platelets: 201 10*3/uL (ref 150–400)
RBC: 4.65 MIL/uL (ref 4.22–5.81)
RDW: 14.6 % (ref 11.5–15.5)
WBC: 11.9 10*3/uL — ABNORMAL HIGH (ref 4.0–10.5)
nRBC: 0 % (ref 0.0–0.2)

## 2023-01-25 LAB — RAPID URINE DRUG SCREEN, HOSP PERFORMED
Amphetamines: POSITIVE — AB
Barbiturates: NOT DETECTED
Benzodiazepines: NOT DETECTED
Cocaine: POSITIVE — AB
Opiates: NOT DETECTED
Tetrahydrocannabinol: NOT DETECTED

## 2023-01-25 LAB — CK: Total CK: 3833 U/L — ABNORMAL HIGH (ref 49–397)

## 2023-01-25 LAB — ETHANOL: Alcohol, Ethyl (B): <10 mg/dL (ref <10)

## 2023-01-25 MED ORDER — ENOXAPARIN SODIUM 40 MG/0.4ML IJ SOSY
40.0000 mg | PREFILLED_SYRINGE | INTRAMUSCULAR | Status: DC
  Administered 2023-01-26: 40 mg via SUBCUTANEOUS
  Filled 2023-01-25: qty 0.4

## 2023-01-25 MED ORDER — METOPROLOL SUCCINATE ER 50 MG PO TB24
50.0000 mg | ORAL_TABLET | Freq: Every day | ORAL | Status: DC
  Administered 2023-01-26: 50 mg via ORAL
  Filled 2023-01-25: qty 1

## 2023-01-25 MED ORDER — SODIUM CHLORIDE 0.9 % IV BOLUS
1000.0000 mL | Freq: Once | INTRAVENOUS | Status: AC
  Administered 2023-01-25: 1000 mL via INTRAVENOUS

## 2023-01-25 MED ORDER — SODIUM CHLORIDE 0.9 % IV SOLN: INTRAVENOUS | Status: DC

## 2023-01-25 MED ORDER — MIRTAZAPINE 15 MG PO TABS
15.0000 mg | ORAL_TABLET | Freq: Every day | ORAL | Status: DC
  Administered 2023-01-25: 15 mg via ORAL
  Filled 2023-01-25: qty 1

## 2023-01-25 MED ORDER — DIVALPROEX SODIUM ER 500 MG PO TB24
500.0000 mg | ORAL_TABLET | Freq: Two times a day (BID) | ORAL | Status: DC
  Administered 2023-01-25 – 2023-01-26 (×3): 500 mg via ORAL
  Filled 2023-01-25 (×3): qty 1

## 2023-01-25 MED ORDER — ARIPIPRAZOLE 5 MG PO TABS
10.0000 mg | ORAL_TABLET | Freq: Every day | ORAL | Status: DC
  Administered 2023-01-25 – 2023-01-26 (×2): 10 mg via ORAL
  Filled 2023-01-25 (×2): qty 2

## 2023-01-25 MED ORDER — POTASSIUM CHLORIDE 10 MEQ/100ML IV SOLN
10.0000 meq | INTRAVENOUS | Status: AC
  Administered 2023-01-25 (×2): 10 meq via INTRAVENOUS
  Filled 2023-01-25 (×2): qty 100

## 2023-01-25 MED ORDER — FLUTICASONE PROPIONATE 50 MCG/ACT NA SUSP
1.0000 | Freq: Every day | NASAL | Status: DC
  Filled 2023-01-25: qty 16

## 2023-01-25 MED ORDER — TRAZODONE HCL 50 MG PO TABS: 150.0000 mg | ORAL_TABLET | Freq: Every evening | ORAL | Status: DC | PRN

## 2023-01-25 MED ORDER — POTASSIUM CHLORIDE 10 MEQ/100ML IV SOLN
10.0000 meq | INTRAVENOUS | Status: AC
  Administered 2023-01-25 – 2023-01-26 (×2): 10 meq via INTRAVENOUS
  Filled 2023-01-25 (×2): qty 100

## 2023-01-25 MED ORDER — NICOTINE 21 MG/24HR TD PT24
21.0000 mg | MEDICATED_PATCH | Freq: Every day | TRANSDERMAL | Status: DC
  Administered 2023-01-25: 21 mg via TRANSDERMAL
  Filled 2023-01-25 (×2): qty 1

## 2023-01-25 NOTE — H&P (Signed)
Hospital Admission History and Physical Service Pager: [phone]  Patient name: Cory Floyd Medical record number: [id number] Date of Birth: 09-16-1976 Age: 47 y.o. Gender: male  Primary Care Provider: Pcp, No Consultants: None Code Status: FULL Preferred Emergency Contact:  Contact Information     Name Relation Home Work Mobile   No last name,Samantha Grandmother [phone]         Chief Complaint: Headache, Hallucinations  Assessment and Plan: CARMAN FLACH is a 47 y.o. male  with PMHx Bipolar 1 disorder, manic depression, alcohol use, illicit drug use, HTN presenting with headache and visual/auditory hallucinations. Differential for this patient's presentation of this include most likely medication noncompliance (stopped taking medications after discharge from psychiatric unit at [facility])  * Rhabdomyolysis CK 3,833> repeat tomorrow. S/p NS bolus, added mIVF. Largely asymptomatic. Hemodynamically stable.  -Admit to med telemetry, Dr. McDiarmid as attending -- Recheck CK in AM  -Repeat CBC and BMP in a.m. - Maintain appropriate hydration with maintenance fluids, regular diet - VTE prophylaxis Lovenox  - In&Output - VS per floor protocol   AKI (acute kidney injury) AKI, likely prerenal, with Cr 1.8 GFR 46 (baseline 0.7).  -- Keep hydrated; repeat BMP in AM  Polysubstance dependence Last used methamphetamine and cocaine two days ago -- monitor for withdrawal -- cardiac monitor x12 hours  Bipolar 1 disorder Restart home Aripiprazole, Trazodone, Mirtazapine, Depakote. Possible has ACT team, need to contact case worker about medications and close follow up. Stable hallucinations  Alcohol use disorder Pint of liquor weekly, slightly tremulous with last drink 3 days ago.  - CIWAs, TOC consult     Hypokalemia--repleted  Elevated LFTs-- recheck in AM HTN-- Hold home losartan in setting of AKI, normotensive   Unsafe Disposition  Patient reports that he does not  have a safe home to go to as the people he lives with influence and to continue with drug use.  He does not have a PCP, unsure of psychiatry follow-up.  Have consulted transitions of care for assistance, possibly group home placement in addition to assistance with close psychiatry follow-up and PCP placement.    FEN/GI: Regular VTE Prophylaxis: Lovenox   Disposition: med tele   History of Present Illness:  Cory Floyd is a 47 y.o. male presenting with headache and hallucinations.  He reports that he was recently released from [facility] psychiatric unit.  He was discharged with multiple medications, has not taken any of these medications since he was released.  He reports that he lives in a home with several roommates.  He took cocaine and methamphetamine 2 days ago and drinks approximately 1 pint of alcohol weekly.  [place] and [place] alcohol.  He continues to have auditory and visual hallucinations, visual hallucinations consist of shadow figures.  He also noted a frontal headache that he has been having.  It is for these reasons that he came into the emergency department  In the ED, patient had a CT of 3833, UDS with positive cocaine and amphetamines, CMP with creatinine of 1.8, AST 94, ALT 55, CBC with WBC 11.9.   Review Of Systems: Per HPI with the following additions: None  Pertinent Past Medical History: Bipolar disorder, type 1 Manic depression HTN  Illicit drug use  Alcohol use   Remainder reviewed in history tab.   Pertinent Past Surgical History: Past Surgical History:  Procedure Laterality Date   COLONOSCOPY  02/05/2014   diverticulosis, hyperplastic polyp x  2   fracture arm Right    INCISION AND DRAINAGE ABSCESS Bilateral 12/29/2022   Procedure: INCISION AND DRAINAGE ABSCESS BILATERAL ARMS;  Surgeon: Shona Needles, MD;  Location: [place];  Service: Orthopedics;  Laterality: Bilateral;   KNEE SURGERY Right 2013   NASAL SEPTUM SURGERY      NASAL TURBINATE REDUCTION Bilateral 12/12/2016   Procedure: TURBINATE REDUCTION/SUBMUCOSAL RESECTION;  Surgeon: Beverly Gust, MD;  Location: [facility];  Service: ENT;  Laterality: Bilateral;  Remainder reviewed in history tab.   Pertinent Social History: Tobacco use: Yes--current 2PPD Alcohol use: Monday; 1 pint of liquor a week  Other Substance use: Cocaine and meth 2 days ago  Lives with roommates in a home  Pertinent Family History: Family History  Problem Relation Age of Onset   Hepatitis C Mother    Cancer Mother    Cancer Maternal Aunt    Hypertension Maternal Grandmother      Remainder reviewed in history tab.   Important Outpatient Medications: Flonase  Metoprolol  Losartan Trazodone  Mirtazipine  Depakote  Aripiprazole  Remainder reviewed in medication history.   Objective: BP 111/74 (BP Location: Left Arm)   Pulse 98   Temp 97.9 F (36.6 C) (Oral)   Resp 18   SpO2 100%  Exam: General: No acute distress, pleasant Caucasian male, tracking properly with appropriate answers to questions HEENT: PERRL, EOMI, normal external ears/nares, no thyroid abnormality  Neck: No abnormalities  Cardiovascular: Regular rate and rhythm, no murmurs gallops or rubs Respiratory: CTA B, normal work of breathing Gastrointestinal: Nontender to palpation, nondistended, soft MSK: Palpable DP pulses, able to move all extremities Derm: No notable rashes Neuro: Alert and oriented x 3, no neurologic focal deficits Psych: Not responding to internal stimuli, answering questions appropriately, appropriate mood and affect   Labs:  CBC BMET  Recent Labs  Lab 01/25/23 0010  WBC 11.9*  HGB 14.0  HCT 42.1  PLT 201   Recent Labs  Lab 01/25/23 0010  NA 135  K 3.4*  CL 102  CO2 21*  BUN 39*  CREATININE 1.80*  GLUCOSE 156*  CALCIUM 9.4     EKG:  NSR   Imaging Studies Performed:  Imaging Study  Impression from Radiologist:  US RENAL  Result Date: 01/25/2023 CLINICAL  DATA:  47 year old male with acute renal insufficiency. EXAM: RENAL / URINARY TRACT ULTRASOUND COMPLETE COMPARISON:  CT Abdomen and Pelvis 12/26/2019. FINDINGS: Right Kidney: Renal measurements: 10.9 x 6.6 x 7.0 cm = volume: 265 mL. Echogenicity within normal limits. No mass or hydronephrosis visualized. Left Kidney: Renal measurements: 12.4 x 5.5 x 5.7 cm = volume: 202 mL. Echogenicity within normal limits. No solid mass or hydronephrosis visualized. Small chronic exophytic left lower pole cyst redemonstrated and appears simple/benign (image 23 - no follow-up imaging recommended). Bladder: Appears normal for degree of bladder distention.  No urinary debris. Other: None. IMPRESSION: Normal ultrasound appearance of both kidneys and the urinary bladder. Electronically Signed   By: Genevie Ann M.D.   On: 01/25/2023 05:56   DG Chest 2 View  Result Date: 01/25/2023 CLINICAL DATA:  47 year old male with tachycardia.  Smoker. EXAM: CHEST - 2 VIEW COMPARISON:  Portable chest 12/26/2022 and earlier. FINDINGS: PA and lateral views at 0508 hours. Lower lung volumes compared to 2021. Mediastinal contours remain normal. Visualized tracheal air column is within normal limits. Lung markings are stable, probably smoking related chronic and symmetric increased interstitium. No pneumothorax, pulmonary edema, pleural effusion, or confluent lung opacity. No  acute osseous abnormality identified. Negative visible bowel gas. IMPRESSION: No acute cardiopulmonary abnormality. Electronically Signed   By: Genevie Ann M.D.   On: 01/25/2023 05:54        Erskine Emery, MD 01/25/2023, 12:55 PM PGY-2, [place] Intern pager: [phone], text pages welcome Secure chat group [place]

## 2023-01-25 NOTE — Assessment & Plan Note (Addendum)
AKI, likely prerenal, with Cr 1.8 GFR 46 (baseline 0.7).  -- Keep hydrated; repeat BMP in AM

## 2023-01-25 NOTE — TOC Initial Note (Signed)
Transition of Care [facility]) - Initial/Assessment Note    Patient Details  Name: Cory Floyd MRN: [id number] Date of Birth: November 09, 1975  Transition of Care [facility] CM/SW Contact:    Curlene Labrum, RN Phone Number: 01/25/2023, 2:52 PM  Clinical Narrative:                 Cm met with the patient at the bedside to discuss TOC needs.  The patient is currently homeless and lives on the "streets".  The patient with recent use of cocaine, ETOH and methamphetamine use.  I provided the patient with shelter resources and OP Counseling resources at the bedside.  The patient states that he is active with [facility] (ACT Team) but has not followed up with them in the past 18 months.  The patient has cell phone number to [facility] and I requested that the patient follow up with the [place] Team for housing assistance, behavioral health follow up and Outpatient counseling for drug and alcohol use.    The patient was set up with PCP - scheduled for appointment with [place] and placed I AVS for hospital follow up.  CM with TOC will continue to follow the patient for transportation via bus ticket in case he needs bus ticket to the shelter when ready for discharge - later date.  Expected Discharge Plan: Home/Self Care Barriers to Discharge: Continued Medical Work up   Patient Goals and CMS Choice Patient states their goals for this hospitalization and ongoing recovery are:: to get better CMS Medicare.gov Compare Post Acute Care list provided to:: Patient Choice offered to / list presented to : Patient [place] ownership interest in [facility].provided to:: Patient    Expected Discharge Plan and Services   Discharge Planning Services: CM Consult, Follow-up appt scheduled Post Acute Care Choice: Resumption of Svcs/PTA Provider Living arrangements for the past 2 months: Homeless                                      Prior Living  Arrangements/Services Living arrangements for the past 2 months: Homeless Lives with:: Self Patient language and need for interpreter reviewed:: Yes Do you feel safe going back to the place where you live?:  (Patient is currently homeless)      Need for Family Participation in Patient Care: No (Comment) Care giver support system in place?: Yes (comment) (ACt Team)   Criminal Activity/Legal Involvement Pertinent to Current Situation/Hospitalization: No - Comment as needed  Activities of Daily Living Home Assistive Devices/Equipment: Eyeglasses ADL Screening (condition at time of admission) Patient's cognitive ability adequate to safely complete daily activities?: Yes Is the patient deaf or have difficulty hearing?: No Does the patient have difficulty seeing, even when wearing glasses/contacts?: No Does the patient have difficulty concentrating, remembering, or making decisions?: No Patient able to express need for assistance with ADLs?: Yes Does the patient have difficulty dressing or bathing?: No Independently performs ADLs?: Yes (appropriate for developmental age) Does the patient have difficulty walking or climbing stairs?: No Weakness of Legs: None Weakness of Arms/Hands: None  Permission Sought/Granted Permission sought to share information with : Case Manager, Family Supports, PCP       Permission granted to share info w AGENCY: Patient requested to call and follow up with [facility] for resources/assistance with OP Drug counseling and housing assistance  Permission granted to share info  w Relationship: ACt Team with [facility]     Emotional Assessment Appearance:: Appears stated age Attitude/Demeanor/Rapport: Engaged Affect (typically observed): Accepting Orientation: : Oriented to Self, Oriented to Place, Oriented to  Time, Oriented to Situation Alcohol / Substance Use: Tobacco Use, Alcohol Use, Illicit Drugs Psych Involvement: No (comment)  Admission  diagnosis:  Rhabdomyolysis [M62.82] Polysubstance abuse [F19.10] AKI (acute kidney injury) [N17.9] Patient Active Problem List   Diagnosis Date Noted   Rhabdomyolysis 01/25/2023   Dehydration 01/25/2023   Alcohol use disorder 01/25/2023   AKI (acute kidney injury) 12/27/2022   Visual hallucinations 12/20/2022   Cellulitis 12/19/2022   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   Bipolar disorder with severe depression 05/02/2021   Bipolar 1 disorder, depressed, severe 04/02/2021   Cocaine abuse with cocaine-induced mood disorder 04/02/2021   Bipolar disorder 01/24/2021   Substance induced mood disorder 07/26/2020   Suicidal ideation    Polysubstance dependence 07/25/2020   Bipolar affective disorder, depressed, moderate 03/06/2020   Bipolar 1 disorder 09/30/2019   Arthritis 09/30/2019   Gastroesophageal reflux disease without esophagitis 09/21/2016   Obstructive sleep apnea 09/21/2016   Morbid obesity with BMI of 40.0-44.9, adult 08/15/2016   Stable angina pectoris 08/15/2016   PCP:  Pcp, No Pharmacy:   [facility] [place], [address] [place] [address] Phone: [phone] Fax: [phone]  [facility] [address] [place] [address] Phone: [phone] Fax: [phone]  [place], [place] [address] Phone: [phone] Fax: [place] [address] Phone: [phone] Fax: [phone]     Social Determinants of Health (SDOH) Social History: SDOH Screenings   Food Insecurity: Food Insecurity Present (01/25/2023)  Housing: Medium Risk (01/25/2023)  Transportation Needs: Unmet Transportation Needs (01/25/2023)  Utilities: Patient Declined (01/25/2023)  Alcohol Screen: Low Risk   (12/30/2022)  Tobacco Use: High Risk (01/24/2023)   SDOH Interventions:     Readmission Risk Interventions     No data to display

## 2023-01-25 NOTE — Assessment & Plan Note (Signed)
Pint of liquor weekly, slightly tremulous with last drink 3 days ago.  - CIWAs, TOC consult

## 2023-01-25 NOTE — Assessment & Plan Note (Addendum)
Restart home Aripiprazole, Trazodone, Mirtazapine, Depakote. Possible has ACT team, need to contact case worker about medications and close follow up. Stable hallucinations

## 2023-01-25 NOTE — ED Notes (Signed)
..ED TO INPATIENT HANDOFF REPORT  ED Nurse Name and Phone #: [phone]  S Name/Age/Gender Cory Floyd 47 y.o. male Room/Bed: H015C/H015C  Code Status   Code Status: Full Code  Home/SNF/Other Home Patient oriented to: self, place, and situation Is this baseline? No   Triage Complete: Triage complete  Chief Complaint Rhabdomyolysis [M62.82]  Triage Note Pt was discharged from [facility] Monday. He then went and did cocaine and meth. He stayed up all night Monday and very little sleep tonight. He states voices are telling him "he is trouble and need to be dealth with". Hallucinations of shadows and lights coming on and off. Has meds but has not taken them. Pt denies SI and HI   Allergies Allergies  Allergen Reactions   Carrot [Daucus Carota] Anaphylaxis and Rash   Penicillins Anaphylaxis and Hives    Has patient had a PCN reaction causing immediate rash, facial/tongue/throat swelling, SOB or lightheadedness with hypotension: Yes Has patient had a PCN reaction causing severe rash involving mucus membranes or skin necrosis: No Has patient had a PCN reaction that required hospitalization Yes Has patient had a PCN reaction occurring within the last 10 years: No If all of the above answers are "NO", then may proceed with Cephalosporin use.    Principen [Ampicillin] Anaphylaxis and Hives    Level of Care/Admitting Diagnosis ED Disposition     ED Disposition  Admit   Condition  --   Comment  Hospital Area: [facility] [[id number]]  Level of Care: Telemetry Medical [104]  May place patient in observation at [facility] or [place] if equivalent level of care is available:: Yes  Covid Evaluation: Asymptomatic - no recent exposure (last 10 days) testing not required  Diagnosis: Rhabdomyolysis [728.88.ICD-9-CM]  Admitting Physician: Erskine Emery [id number]  Attending Physician: Wendy Poet, TODD D [[id number]]          B Medical/Surgery History Past Medical  History:  Diagnosis Date   Alcoholic    clean for 3 years   Anginal pain    Arthritis    Bipolar 1 disorder    COVID-19    Depressed    Diabetes mellitus without complication    Dyspnea    Fatty liver    Hypertension    Schizophrenia    Past Surgical History:  Procedure Laterality Date   COLONOSCOPY  02/05/2014   diverticulosis, hyperplastic polyp x 2   fracture arm Right    INCISION AND DRAINAGE ABSCESS Bilateral 12/29/2022   Procedure: INCISION AND DRAINAGE ABSCESS BILATERAL ARMS;  Surgeon: Shona Needles, MD;  Location: [facility];  Service: Orthopedics;  Laterality: Bilateral;   KNEE SURGERY Right 2013   NASAL SEPTUM SURGERY     NASAL TURBINATE REDUCTION Bilateral 12/12/2016   Procedure: TURBINATE REDUCTION/SUBMUCOSAL RESECTION;  Surgeon: Beverly Gust, MD;  Location: [facility];  Service: ENT;  Laterality: Bilateral;     A IV Location/Drains/Wounds Patient Lines/Drains/Airways Status     Active Line/Drains/Airways     Name Placement date Placement time Site Days   Peripheral IV 01/25/23 20 G 1" Anterior;Distal;Left;Upper Arm 01/25/23  0700  Arm  less than 1            Intake/Output Last 24 hours No intake or output data in the 24 hours ending 01/25/23 1140  Labs/Imaging Results for orders placed or performed during the hospital encounter of 01/24/23 (from the past 48 hour(s))  Rapid urine drug screen (hospital performed)     Status: Abnormal  Collection Time: 01/24/23 11:54 PM  Result Value Ref Range   Opiates NONE DETECTED NONE DETECTED   Cocaine POSITIVE (A) NONE DETECTED   Benzodiazepines NONE DETECTED NONE DETECTED   Amphetamines POSITIVE (A) NONE DETECTED   Tetrahydrocannabinol NONE DETECTED NONE DETECTED   Barbiturates NONE DETECTED NONE DETECTED    Comment: (NOTE) DRUG SCREEN FOR MEDICAL PURPOSES ONLY.  IF CONFIRMATION IS NEEDED FOR ANY PURPOSE, NOTIFY LAB WITHIN 5 DAYS.  LOWEST DETECTABLE LIMITS FOR URINE DRUG SCREEN Drug Class                      Cutoff (ng/mL) Amphetamine and metabolites    1000 Barbiturate and metabolites    200 Benzodiazepine                 200 Opiates and metabolites        300 Cocaine and metabolites        300 THC                            50 Performed at [facility], [place] [address], [place], [place] [address]   Urinalysis, w/ Reflex to Culture (Infection Suspected) -Urine, Clean Catch     Status: Abnormal   Collection Time: 01/24/23 11:54 PM  Result Value Ref Range   Specimen Source URINE, CLEAN CATCH    Color, Urine AMBER (A) YELLOW    Comment: BIOCHEMICALS MAY BE AFFECTED BY COLOR   APPearance TURBID (A) CLEAR   Specific Gravity, Urine 1.027 1.005 - 1.030   pH 5.0 5.0 - 8.0   Glucose, UA NEGATIVE NEGATIVE mg/dL   Hgb urine dipstick NEGATIVE NEGATIVE   Bilirubin Urine NEGATIVE NEGATIVE   Ketones, ur NEGATIVE NEGATIVE mg/dL   Protein, ur 30 (A) NEGATIVE mg/dL   Nitrite NEGATIVE NEGATIVE   Leukocytes,Ua NEGATIVE NEGATIVE   RBC / HPF 0-5 0 - 5 RBC/hpf   WBC, UA 6-10 0 - 5 WBC/hpf    Comment:        Reflex urine culture not performed if WBC <=10, OR if Squamous epithelial cells >5. If Squamous epithelial cells >5 suggest recollection.    Bacteria, UA FEW (A) NONE SEEN   Squamous Epithelial / HPF 0-5 0 - 5 /HPF   Hyaline Casts, UA PRESENT    Amorphous Crystal PRESENT     Comment: Performed at [facility], [address], [place], [place] [address]  Comprehensive metabolic panel     Status: Abnormal   Collection Time: 01/25/23 12:10 AM  Result Value Ref Range   Sodium 135 135 - 145 mmol/L   Potassium 3.4 (L) 3.5 - 5.1 mmol/L   Chloride 102 98 - 111 mmol/L   CO2 21 (L) 22 - 32 mmol/L   Glucose, Bld 156 (H) 70 - 99 mg/dL    Comment: Glucose reference range applies only to samples taken after fasting for at least 8 hours.   BUN 39 (H) 6 - 20 mg/dL   Creatinine, Ser 1.80 (H) 0.61 - 1.24 mg/dL   Calcium 9.4 8.9 - 10.3 mg/dL   Total Protein 8.4 (H) 6.5 - 8.1 g/dL   Albumin 4.5  3.5 - 5.0 g/dL   AST 94 (H) 15 - 41 U/L   ALT 55 (H) 0 - 44 U/L   Alkaline Phosphatase 69 38 - 126 U/L   Total Bilirubin 0.8 0.3 - 1.2 mg/dL   GFR, Estimated 46 (  L) >60 mL/min    Comment: (NOTE) Calculated using the CKD-EPI Creatinine Equation (2021)    Anion gap 12 5 - 15    Comment: Performed at [facility], [place] [address] [place], [place] [address]  Ethanol     Status: None   Collection Time: 01/25/23 12:10 AM  Result Value Ref Range   Alcohol, Ethyl (B) <10 <10 mg/dL    Comment: (NOTE) Lowest detectable limit for serum alcohol is 10 mg/dL.  For medical purposes only. Performed at [facility], [place] [address] [place] [address], [place], [place] [address]   cbc     Status: Abnormal   Collection Time: 01/25/23 12:10 AM  Result Value Ref Range   WBC 11.9 (H) 4.0 - 10.5 K/uL   RBC 4.65 4.22 - 5.81 MIL/uL   Hemoglobin 14.0 13.0 - 17.0 g/dL   HCT 42.1 39.0 - 52.0 %   MCV 90.5 80.0 - 100.0 fL   MCH 30.1 26.0 - 34.0 pg   MCHC 33.3 30.0 - 36.0 g/dL   RDW 14.6 11.5 - 15.5 %   Platelets 201 150 - 400 K/uL   nRBC 0.0 0.0 - 0.2 %    Comment: Performed at [facility], [place] [address], [place], [place] [address]  CK     Status: Abnormal   Collection Time: 01/25/23 12:10 AM  Result Value Ref Range   Total CK 3,833 (H) 49 - 397 U/L    Comment: Performed at [facility], [place] [address], [place], [place] [address]   US RENAL  Result Date: 01/25/2023 CLINICAL DATA:  47 year old male with acute renal insufficiency. EXAM: RENAL / URINARY TRACT ULTRASOUND COMPLETE COMPARISON:  CT Abdomen and Pelvis 12/26/2019. FINDINGS: Right Kidney: Renal measurements: 10.9 x 6.6 x 7.0 cm = volume: 265 mL. Echogenicity within normal limits. No mass or hydronephrosis visualized. Left Kidney: Renal measurements: 12.4 x 5.5 x 5.7 cm = volume: 202 mL. Echogenicity within normal limits. No solid mass or hydronephrosis visualized. Small chronic exophytic left lower pole cyst redemonstrated and appears  simple/benign (image 23 - no follow-up imaging recommended). Bladder: Appears normal for degree of bladder distention.  No urinary debris. Other: None. IMPRESSION: Normal ultrasound appearance of both kidneys and the urinary bladder. Electronically Signed   By: Genevie Ann M.D.   On: 01/25/2023 05:56   DG Chest 2 View  Result Date: 01/25/2023 CLINICAL DATA:  47 year old male with tachycardia.  Smoker. EXAM: CHEST - 2 VIEW COMPARISON:  Portable chest 12/26/2022 and earlier. FINDINGS: PA and lateral views at 0508 hours. Lower lung volumes compared to 2021. Mediastinal contours remain normal. Visualized tracheal air column is within normal limits. Lung markings are stable, probably smoking related chronic and symmetric increased interstitium. No pneumothorax, pulmonary edema, pleural effusion, or confluent lung opacity. No acute osseous abnormality identified. Negative visible bowel gas. IMPRESSION: No acute cardiopulmonary abnormality. Electronically Signed   By: Genevie Ann M.D.   On: 01/25/2023 05:54    Pending Labs Unresulted Labs (From admission, onward)     Start     Ordered   01/26/23 0500  CBC  (enoxaparin (LOVENOX)    CrCl >/= 30 ml/min)  Tomorrow morning,   R       Comments: Baseline for enoxaparin therapy IF NOT ALREADY DRAWN.  Notify MD if PLT < 100 K.    01/25/23 0809   01/26/23 0500  Comprehensive metabolic panel  Tomorrow morning,   R  01/25/23 0809   01/26/23 0500  CK  Tomorrow morning,   R        01/25/23 0809            Vitals/Pain Today's Vitals   01/24/23 2352 01/24/23 2354 01/25/23 0553 01/25/23 0745  BP:  (!) 135/90 116/65 109/73  Pulse:  (!) 132 92 88  Resp:  20 18 15   Temp:  97.7 F (36.5 C) 97.7 F (36.5 C)   TempSrc:  Oral Oral   SpO2:  98% 100% 100%  PainSc: 0-No pain 0-No pain  0-No pain    Isolation Precautions No active isolations  Medications Medications  enoxaparin (LOVENOX) injection 40 mg (has no administration in time range)  0.9 %  sodium  chloride infusion ( Intravenous New Bag/Given 01/25/23 1034)  nicotine (NICODERM CQ - dosed in mg/24 hours) patch 21 mg (21 mg Transdermal Patch Applied 01/25/23 1035)  ARIPiprazole (ABILIFY) tablet 10 mg (has no administration in time range)  divalproex (DEPAKOTE ER) 24 hr tablet 500 mg (has no administration in time range)  fluticasone (FLONASE) 50 MCG/ACT nasal spray 1 spray (has no administration in time range)  metoprolol succinate (TOPROL-XL) 24 hr tablet 50 mg (has no administration in time range)  mirtazapine (REMERON) tablet 15 mg (has no administration in time range)  traZODone (DESYREL) tablet 150 mg (has no administration in time range)  sodium chloride 0.9 % bolus 1,000 mL (0 mLs Intravenous Stopped 01/25/23 0715)    Mobility walks     Focused Assessments Cardiac Assessment Handoff:    Lab Results  Component Value Date   CKTOTAL 3,833 (H) 01/25/2023   TROPONINI <0.03 09/18/2018   Lab Results  Component Value Date   DDIMER <0.27 05/16/2020   Does the Patient currently have chest pain? No   , Neuro Assessment Handoff:  Swallow screen pass? Yes          Neuro Assessment: Exceptions to WDL Neuro Checks:      Has TPA been given? No If patient is a Neuro Trauma and patient is going to OR before floor call report to [place] nurse: [phone] or [phone]   R Recommendations: See Admitting Provider Note  Report given to:   Additional Notes: na

## 2023-01-25 NOTE — Hospital Course (Addendum)
Cory Floyd is a 47 y.o. male presenting with headache and hallucinations.  He reports that he was recently released from [facility] psychiatric unit.  He was discharged with multiple medications, has not taken any of these medications since he was released.  He reports that he lives in a home with several roommates.  His hospital course is detailed below:  * AKI (acute kidney injury)    Alcohol use disorder    Rhabdomyolysis    Polysubstance dependence    Bipolar 1 disorder   PCP Follow-up Recommendations: Stopped metoprolol and losartan on discharge as BP was controlled without.  Stopped depakote due to concern of supratherapeutic level due to high risk of no follow up

## 2023-01-25 NOTE — ED Provider Notes (Signed)
[facility] Provider Note   CSN: [id number] Arrival date & time: 01/24/23  2338     History  Chief Complaint  Patient presents with   Hallucinations    Cory Floyd is a 47 y.o. male.  The history is provided by the patient and medical records.  Cory Floyd is a 47 y.o. male who presents to the Emergency Department complaining of poor sleep.  Per the emergency department complaining of poor sleep for the last 2 days.  He states that he keeps talking to himself.  He does report using both cocaine and methamphetamine 2 days ago.  No fevers, chest pain, shortness of breath, cough, nausea, vomiting, abdominal pain.     Home Medications Prior to Admission medications   Medication Sig Start Date End Date Taking? Authorizing Provider  ARIPiprazole (ABILIFY) 10 MG tablet Take 1 tablet (10 mg total) by mouth daily. 01/22/23   Clapacs, [name], MD  divalproex (DEPAKOTE ER) 500 MG 24 hr tablet Take 1 tablet (500 mg total) by mouth 2 (two) times daily. 01/22/23   Clapacs, [name], MD  fluticasone (FLONASE) 50 MCG/ACT nasal spray Place 1 spray into both nostrils daily. 01/23/23   Clapacs, [name], MD  losartan (COZAAR) 50 MG tablet Take 1 tablet (50 mg total) by mouth daily. 01/23/23   Clapacs, [name], MD  metoprolol succinate (TOPROL-XL) 50 MG 24 hr tablet Take 1 tablet (50 mg total) by mouth daily. 01/23/23   Clapacs, [name], MD  mirtazapine (REMERON) 15 MG tablet Take 1 tablet (15 mg total) by mouth at bedtime. 01/22/23   Clapacs, [name], MD  traZODone (DESYREL) 150 MG tablet Take 1 tablet (150 mg total) by mouth at bedtime as needed for sleep. 01/22/23   Clapacs, [name], MD      Allergies    Carrot [daucus carota], Penicillins, and Principen [ampicillin]    Review of Systems   Review of Systems  All other systems reviewed and are negative.   Physical Exam Updated Vital Signs BP 116/65 (BP Location: Right Arm)   Pulse 92   Temp 97.7 F (36.5 C)  (Oral)   Resp 18   SpO2 100%  Physical Exam Vitals and nursing note reviewed.  Constitutional:      Appearance: He is well-developed.  HENT:     Head: Normocephalic and atraumatic.  Cardiovascular:     Rate and Rhythm: Regular rhythm. Tachycardia present.     Heart sounds: No murmur heard. Pulmonary:     Effort: Pulmonary effort is normal. No respiratory distress.     Breath sounds: Normal breath sounds.  Abdominal:     Palpations: Abdomen is soft.     Tenderness: There is no abdominal tenderness. There is no guarding or rebound.  Musculoskeletal:        General: No swelling or tenderness.  Skin:    General: Skin is warm and dry.  Neurological:     Mental Status: He is alert and oriented to person, place, and time.  Psychiatric:     Comments: Anxious, restless.     ED Results / Procedures / Treatments   Labs (all labs ordered are listed, but only abnormal results are displayed) Labs Reviewed  COMPREHENSIVE METABOLIC PANEL - Abnormal; Notable for the following components:      Result Value   Potassium 3.4 (*)    CO2 21 (*)    Glucose, Bld 156 (*)    BUN  39 (*)    Creatinine, Ser 1.80 (*)    Total Protein 8.4 (*)    AST 94 (*)    ALT 55 (*)    GFR, Estimated 46 (*)    All other components within normal limits  CBC - Abnormal; Notable for the following components:   WBC 11.9 (*)    All other components within normal limits  RAPID URINE DRUG SCREEN, HOSP PERFORMED - Abnormal; Notable for the following components:   Cocaine POSITIVE (*)    Amphetamines POSITIVE (*)    All other components within normal limits  URINALYSIS, W/ REFLEX TO CULTURE (INFECTION SUSPECTED) - Abnormal; Notable for the following components:   Color, Urine AMBER (*)    APPearance TURBID (*)    Protein, ur 30 (*)    Bacteria, UA FEW (*)    All other components within normal limits  CK - Abnormal; Notable for the following components:   Total CK 3,833 (*)    All other components within normal  limits  ETHANOL    EKG None  Radiology US RENAL  Result Date: 01/25/2023 CLINICAL DATA:  47 year old male with acute renal insufficiency. EXAM: RENAL / URINARY TRACT ULTRASOUND COMPLETE COMPARISON:  CT Abdomen and Pelvis 12/26/2019. FINDINGS: Right Kidney: Renal measurements: 10.9 x 6.6 x 7.0 cm = volume: 265 mL. Echogenicity within normal limits. No mass or hydronephrosis visualized. Left Kidney: Renal measurements: 12.4 x 5.5 x 5.7 cm = volume: 202 mL. Echogenicity within normal limits. No solid mass or hydronephrosis visualized. Small chronic exophytic left lower pole cyst redemonstrated and appears simple/benign (image 23 - no follow-up imaging recommended). Bladder: Appears normal for degree of bladder distention.  No urinary debris. Other: None. IMPRESSION: Normal ultrasound appearance of both kidneys and the urinary bladder. Electronically Signed   By: Genevie Ann M.D.   On: 01/25/2023 05:56   DG Chest 2 View  Result Date: 01/25/2023 CLINICAL DATA:  47 year old male with tachycardia.  Smoker. EXAM: CHEST - 2 VIEW COMPARISON:  Portable chest 12/26/2022 and earlier. FINDINGS: PA and lateral views at 0508 hours. Lower lung volumes compared to 2021. Mediastinal contours remain normal. Visualized tracheal air column is within normal limits. Lung markings are stable, probably smoking related chronic and symmetric increased interstitium. No pneumothorax, pulmonary edema, pleural effusion, or confluent lung opacity. No acute osseous abnormality identified. Negative visible bowel gas. IMPRESSION: No acute cardiopulmonary abnormality. Electronically Signed   By: Genevie Ann M.D.   On: 01/25/2023 05:54    Procedures Procedures    Medications Ordered in ED Medications  sodium chloride 0.9 % bolus 1,000 mL (1,000 mLs Intravenous New Bag/Given 01/25/23 0706)    ED Course/ Medical Decision Making/ A&P                             Medical Decision Making Amount and/or Complexity of Data Reviewed Labs:  ordered. Radiology: ordered.  Risk Decision regarding hospitalization.   Patient with history of polysubstance abuse here for evaluation of poor sleep and hallucinations which she describes as him talking to himself for 2 days.  He does report relapsing on cocaine and methamphetamine 2 days ago.  He is anxious and restless on evaluation in the emergency department but he does not have additional somatic complaints.  Labs significant for AKI with creatinine of 1.8 with elevation in his CPK.  He was treated with IV fluid hydration.  Concern for kidney injury secondary to his substance  abuse.  Plan to admit for IV fluids for further evaluation.  Family medicine consulted for admission.        Final Clinical Impression(s) / ED Diagnoses Final diagnoses:  AKI (acute kidney injury)  Polysubstance abuse    Rx / DC Orders ED Discharge Orders     None         Quintella Reichert, MD 01/25/23 [phone]

## 2023-01-25 NOTE — ED Notes (Addendum)
Pt denies any auditory hallucinations at this time

## 2023-01-25 NOTE — Assessment & Plan Note (Addendum)
CK 3,833> repeat tomorrow. S/p NS bolus, added mIVF. Largely asymptomatic. Hemodynamically stable.  -Admit to med telemetry, Dr. McDiarmid as attending -- Recheck CK in AM  -Repeat CBC and BMP in a.m. - Maintain appropriate hydration with maintenance fluids, regular diet - VTE prophylaxis Lovenox  - In&Output - VS per floor protocol

## 2023-01-25 NOTE — Progress Notes (Addendum)
Patient C/O burning at IV sight, sight is raised and red patient states he has some pain in that area, patient IV removed by myself pharmacy consulted, given an icepack and instructed to elevate arm, patient states that pain has subsided with removal of IV. Attempted to start new IV , no viable veins visible or felt. IV team consult placed.

## 2023-01-25 NOTE — ED Notes (Signed)
Pt given snack box 

## 2023-01-25 NOTE — Progress Notes (Signed)
Patient arrived on unit, able to ambulate independently, is A&Ox4 denies pain and discomfort, hallucinations, nausea at this time. Pt has lunch tray is eating.

## 2023-01-25 NOTE — Assessment & Plan Note (Signed)
Last used methamphetamine and cocaine two days ago -- monitor for withdrawal -- cardiac monitor x12 hours

## 2023-01-26 DIAGNOSIS — M6282 Rhabdomyolysis: Secondary | ICD-10-CM | POA: Diagnosis not present

## 2023-01-26 DIAGNOSIS — N179 Acute kidney failure, unspecified: Secondary | ICD-10-CM | POA: Diagnosis not present

## 2023-01-26 DIAGNOSIS — F319 Bipolar disorder, unspecified: Secondary | ICD-10-CM | POA: Diagnosis not present

## 2023-01-26 DIAGNOSIS — E860 Dehydration: Secondary | ICD-10-CM | POA: Diagnosis not present

## 2023-01-26 LAB — CBC
HCT: 33.5 % — ABNORMAL LOW (ref 39.0–52.0)
Hemoglobin: 11 g/dL — ABNORMAL LOW (ref 13.0–17.0)
MCH: 29.9 pg (ref 26.0–34.0)
MCHC: 32.8 g/dL (ref 30.0–36.0)
MCV: 91 fL (ref 80.0–100.0)
Platelets: 116 10*3/uL — ABNORMAL LOW (ref 150–400)
RBC: 3.68 MIL/uL — ABNORMAL LOW (ref 4.22–5.81)
RDW: 14.1 % (ref 11.5–15.5)
WBC: 4.2 10*3/uL (ref 4.0–10.5)
nRBC: 0 % (ref 0.0–0.2)

## 2023-01-26 LAB — COMPREHENSIVE METABOLIC PANEL
ALT: 60 U/L — ABNORMAL HIGH (ref 0–44)
AST: 59 U/L — ABNORMAL HIGH (ref 15–41)
Albumin: 3 g/dL — ABNORMAL LOW (ref 3.5–5.0)
Alkaline Phosphatase: 52 U/L (ref 38–126)
Anion gap: 8 (ref 5–15)
BUN: 23 mg/dL — ABNORMAL HIGH (ref 6–20)
CO2: 25 mmol/L (ref 22–32)
Calcium: 8.4 mg/dL — ABNORMAL LOW (ref 8.9–10.3)
Chloride: 106 mmol/L (ref 98–111)
Creatinine, Ser: 0.79 mg/dL (ref 0.61–1.24)
GFR, Estimated: >60 mL/min (ref >60)
Glucose, Bld: 99 mg/dL (ref 70–99)
Potassium: 4 mmol/L (ref 3.5–5.1)
Sodium: 139 mmol/L (ref 135–145)
Total Bilirubin: 0.7 mg/dL (ref 0.3–1.2)
Total Protein: 6.1 g/dL — ABNORMAL LOW (ref 6.5–8.1)

## 2023-01-26 LAB — CK: Total CK: 849 U/L — ABNORMAL HIGH (ref 49–397)

## 2023-01-26 MED ORDER — UMECLIDINIUM-VILANTEROL 62.5-25 MCG/ACT IN AEPB: 1.0000 | INHALATION_SPRAY | Freq: Every day | RESPIRATORY_TRACT | Status: DC

## 2023-01-26 NOTE — Care Management Obs Status (Signed)
MEDICARE OBSERVATION STATUS NOTIFICATION   Patient Details  Name: Cory Floyd MRN: [id number] Date of Birth: July 28, 1976   Medicare Observation Status Notification Given:  Yes    Kermit Balo, RN 01/26/2023, 1:35 PM

## 2023-01-26 NOTE — Progress Notes (Signed)
[name] to be D/C'd per MD order. Charge RN discussed with the patient and all questions fully answered.  Skin clean, dry and intact without evidence of skin break down, no evidence of skin tears noted.  IV catheter discontinued intact. Site without signs and symptoms of complications. Dressing and pressure applied.  An After Visit Summary was printed and given to the patient.  Patient escorted off unit and given bus pass, and D/C via bus.  Jon Gills  01/26/2023 3:34 PM

## 2023-01-26 NOTE — Discharge Instructions (Addendum)
[name],  Sorry that you ended up back here in the hospital with [place]. You were admitted due to a condition called rhabdomyolysis which is caused by muscle breakdown that can then damage your kidneys. This was probably caused by your meth/cocaine use. Please give your ACT team a call as soon as you leave the hospital so you can stay connected with resources once out of the hospital. They should be the ones helping to connect you with outpatient medical management. Our social work team also got you set up with a primary care provider. The best thing you can do for your health is to avoid use of cocaine and meth---your ACT team and PCP should be able to connect you to resources for managing your addictions.   Be well,  J Dorothyann Gibbs, MD [facility]

## 2023-01-26 NOTE — Discharge Summary (Signed)
Family Medicine Teaching [facility] Discharge Summary  Patient name: Cory Floyd Medical record number: [id number] Date of birth: 1975/12/14 Age: 47 y.o. Gender: male Date of Admission: 01/24/2023  Date of Discharge: 01/26/23  Admitting Physician: Alfredo Martinez, MD  Primary Care Provider: Pcp, No Consultants: None  Indication for Hospitalization: AKI with related rhabdomyolysis  Discharge Diagnoses/Problem List:  Principal Problem for Admission:  Other Problems addressed during stay:  Principal Problem:   AKI (acute kidney injury) Active Problems:   Dehydration   Bipolar 1 disorder   Polysubstance dependence   Amphetamine abuse   Rhabdomyolysis   Alcohol use disorder   Cocaine abuse with cocaine-induced mood disorder    Brief Hospital Course:  Cory Floyd is a 47 y.o.male with a history of Bipolar I disorder, hypertension, and polysubstance abuse who was admitted for rhabdomyolsis in the setting of cocaine/methamphetamine use with resultant AKI/pigment nephropathy.  Rhabdomyolysis  AKI (pigment nephropathy) Patient presented to the [facility]2022 with headache and hallucinations in the setting of recent behavioral health hospital discharge with poor medication adherence and use of methamphetamines, cocaine, and alcohol. On admission found to have AKI with Cr up to 1.8 (baseline 0.7-1.0) and CK elevated to 3833. He was started on IV fluids and monitored overnight. He maintained excellent urine output and his lab abnormalities improved by the time of discharge on 01/26/2023.   Bipolar I Disorder  Substance-Induced Psychosis Patient with hallucinations present on admission. Unclear if this was related to his non-adherence to his psych meds vs polysubstance use. Hallucinations resolved by day two of hospitalization. He was maintained on Abilify, mirtazapine, and trazodone throughout the hospitalization. Decision made to not continue his Depakote given uncertain follow-up  and risk of toxicity if his use/levels not monitored.   PCP Follow-up Recommendations: Stopped metoprolol and losartan on discharge as BP was controlled without.  Please assess BP and re-start agents as indicated.  Stopped depakote due to concern of supratherapeutic level due to high risk of poor follow up. Please follow mood and psychotic symptoms and adjust medications as indicated.    Disposition: Homeless Shelter  Discharge Condition: Condition   Discharge Exam:  Vitals:   01/26/23 0817 01/26/23 1122  BP: (!) 115/102 92/63  Pulse: 85 84  Resp: 18 18  Temp: 97.9 F (36.6 C) (!) 97.5 F (36.4 C)  SpO2: 97% 98%  Exam per Dr. Theodis Aguas:  General: No acute distress, pleasant Caucasian male, tracking properly with appropriate answers to questions CV: Regular rate and rhythm, no murmurs gallops or rubs. No LE edema.  Respiratory: CTA B, normal work of breathing Gastrointestinal: Nontender to palpation, nondistended, soft MSK: Palpable DP pulses, able to move all extremities Derm: Skin warm and dry Neuro: Alert and oriented x 3, no focal neurologic focal deficits Psych: Appropriate mood and affect, does not appear internally preoccupied. No apparent paranoid ideations, no apparent delusional thought processes.   Significant Procedures: None  Significant Labs and Imaging:  Recent Labs  Lab 01/25/23 0010 01/26/23 0946  WBC 11.9* 4.2  HGB 14.0 11.0*  HCT 42.1 33.5*  PLT 201 116*   Recent Labs  Lab 01/25/23 0010 01/26/23 0946  NA 135 139  K 3.4* 4.0  CL 102 106  CO2 21* 25  GLUCOSE 156* 99  BUN 39* 23*  CREATININE 1.80* 0.79  CALCIUM 9.4 8.4*  ALKPHOS 69 52  AST 94* 59*  ALT 55* 60*  ALBUMIN 4.5 3.0*     Results/Tests Pending at Time of  Discharge: None  Discharge Medications:  Allergies as of 01/26/2023       Reactions   Carrot [daucus Carota] Anaphylaxis, Rash   Penicillins Anaphylaxis, Hives   Has patient had a PCN reaction causing immediate rash,  facial/tongue/throat swelling, SOB or lightheadedness with hypotension: Yes Has patient had a PCN reaction causing severe rash involving mucus membranes or skin necrosis: No Has patient had a PCN reaction that required hospitalization Yes Has patient had a PCN reaction occurring within the last 10 years: No If all of the above answers are "NO", then may proceed with Cephalosporin use.   Principen [ampicillin] Anaphylaxis, Hives        Medication List     STOP taking these medications    divalproex 500 MG 24 hr tablet Commonly known as: DEPAKOTE ER   losartan 50 MG tablet Commonly known as: COZAAR   metoprolol succinate 50 MG 24 hr tablet Commonly known as: TOPROL-XL       TAKE these medications    ARIPiprazole 10 MG tablet Commonly known as: ABILIFY Take 1 tablet (10 mg total) by mouth daily.   fluticasone 50 MCG/ACT nasal spray Commonly known as: FLONASE Place 1 spray into both nostrils daily.   mirtazapine 15 MG tablet Commonly known as: REMERON Take 1 tablet (15 mg total) by mouth at bedtime.   traZODone 150 MG tablet Commonly known as: DESYREL Take 1 tablet (150 mg total) by mouth at bedtime as needed for sleep.        Discharge Instructions: Please refer to Patient Instructions section of EMR for full details.  Patient was counseled important signs and symptoms that should prompt return to medical care, changes in medications, dietary instructions, activity restrictions, and follow up appointments.   Follow-Up Appointments:  Follow-up Information     [facility], [facility] Follow up.   Why: Please follow up with your ACT Team ([facility]) for Outpatient assitance for OP Counseling, housing and behavioral health follow up. Contact information: [address] [place] [address] [phone]         Claiborne RiggFleming, Zelda W, NP Follow up.   Specialty: Nurse Practitioner Why: You are scheduled for a hospital follow up on Friday, February 09, 2023 at 2:10 pm. Contact information: [address] [place] [address] [phone]                 Alicia AmelSanford, Angelette Ganus B, MD 01/26/2023, 1:12 PM PGY-2, [facility]

## 2023-01-26 NOTE — TOC Transition Note (Addendum)
Transition of Care [facility]) - CM/SW Discharge Note   Patient Details  Name: Cory Floyd MRN: [id number] Date of Birth: [birth year]  [facility] CM/SW Contact:  Kermit Balo, RN Phone Number: 01/26/2023, 2:09 PM   Clinical Narrative:    Pt is discharging back to his homeless area. CM has provided bus passes for transport to where he wants to go. CM also provided him clean clothing from SW closet.  PCP follow up on the AVS.    Final next level of care: Homeless Shelter Barriers to Discharge: No Barriers Identified   Patient Goals and CMS Choice CMS Medicare.gov Compare Post Acute Care list provided to:: Patient Choice offered to / list presented to : Patient  Discharge Placement                         Discharge Plan and Services Additional resources added to the After Visit Summary for     Discharge Planning Services: CM Consult, Follow-up appt scheduled Post Acute Care Choice: Resumption of Svcs/PTA Provider                               Social Determinants of Health (SDOH) Interventions SDOH Screenings   Food Insecurity: Food Insecurity Present (01/25/2023)  Housing: Medium Risk (01/25/2023)  Transportation Needs: Unmet Transportation Needs (01/25/2023)  Utilities: Patient Declined (01/25/2023)  Alcohol Screen: Low Risk  (12/30/2022)  Tobacco Use: High Risk (01/24/2023)     Readmission Risk Interventions     No data to display

## 2023-02-09 ENCOUNTER — Inpatient Hospital Stay: Payer: Medicare (Managed Care) | Admitting: Nurse Practitioner

## 2023-02-23 ENCOUNTER — Other Ambulatory Visit: Payer: Self-pay

## 2023-02-23 ENCOUNTER — Emergency Department (HOSPITAL_COMMUNITY)
Admission: EM | Admit: 2023-02-23 | Discharge: 2023-02-23 | Disposition: A | Discharge status: Home / Self Care | Payer: Medicare (Managed Care) | Attending: Emergency Medicine | Admitting: Emergency Medicine

## 2023-02-23 ENCOUNTER — Emergency Department (HOSPITAL_COMMUNITY): Payer: Medicare (Managed Care)

## 2023-02-23 ENCOUNTER — Encounter (HOSPITAL_COMMUNITY): Payer: Self-pay

## 2023-02-23 DIAGNOSIS — R42 Dizziness and giddiness: Secondary | ICD-10-CM | POA: Diagnosis not present

## 2023-02-23 DIAGNOSIS — R0602 Shortness of breath: Secondary | ICD-10-CM | POA: Insufficient documentation

## 2023-02-23 DIAGNOSIS — R000 Tachycardia, unspecified: Secondary | ICD-10-CM | POA: Diagnosis not present

## 2023-02-23 DIAGNOSIS — F1990 Other psychoactive substance use, unspecified, uncomplicated: Secondary | ICD-10-CM

## 2023-02-23 DIAGNOSIS — R55 Syncope and collapse: Secondary | ICD-10-CM | POA: Diagnosis not present

## 2023-02-23 DIAGNOSIS — F17210 Nicotine dependence, cigarettes, uncomplicated: Secondary | ICD-10-CM | POA: Diagnosis not present

## 2023-02-23 DIAGNOSIS — R079 Chest pain, unspecified: Secondary | ICD-10-CM | POA: Diagnosis present

## 2023-02-23 DIAGNOSIS — R7989 Other specified abnormal findings of blood chemistry: Secondary | ICD-10-CM | POA: Insufficient documentation

## 2023-02-23 LAB — CBC WITH DIFFERENTIAL/PLATELET
Abs Immature Granulocytes: 0.01 10*3/uL (ref 0.00–0.07)
Basophils Absolute: 0 10*3/uL (ref 0.0–0.1)
Basophils Relative: 0 %
Eosinophils Absolute: 0 10*3/uL (ref 0.0–0.5)
Eosinophils Relative: 1 %
HCT: 37 % — ABNORMAL LOW (ref 39.0–52.0)
Hemoglobin: 12.1 g/dL — ABNORMAL LOW (ref 13.0–17.0)
Immature Granulocytes: 0 %
Lymphocytes Relative: 14 %
Lymphs Abs: 0.7 10*3/uL (ref 0.7–4.0)
MCH: 30 pg (ref 26.0–34.0)
MCHC: 32.7 g/dL (ref 30.0–36.0)
MCV: 91.6 fL (ref 80.0–100.0)
Monocytes Absolute: 0.5 10*3/uL (ref 0.1–1.0)
Monocytes Relative: 10 %
Neutro Abs: 3.6 10*3/uL (ref 1.7–7.7)
Neutrophils Relative %: 75 %
Platelets: 167 10*3/uL (ref 150–400)
RBC: 4.04 MIL/uL — ABNORMAL LOW (ref 4.22–5.81)
RDW: 15.2 % (ref 11.5–15.5)
WBC: 4.8 10*3/uL (ref 4.0–10.5)
nRBC: 0 % (ref 0.0–0.2)

## 2023-02-23 LAB — COMPREHENSIVE METABOLIC PANEL
ALT: 33 U/L (ref 0–44)
AST: 34 U/L (ref 15–41)
Albumin: 3.6 g/dL (ref 3.5–5.0)
Alkaline Phosphatase: 63 U/L (ref 38–126)
Anion gap: 10 (ref 5–15)
BUN: 19 mg/dL (ref 6–20)
CO2: 23 mmol/L (ref 22–32)
Calcium: 8.9 mg/dL (ref 8.9–10.3)
Chloride: 105 mmol/L (ref 98–111)
Creatinine, Ser: 1.15 mg/dL (ref 0.61–1.24)
GFR, Estimated: >60 mL/min (ref >60)
Glucose, Bld: 89 mg/dL (ref 70–99)
Potassium: 3.4 mmol/L — ABNORMAL LOW (ref 3.5–5.1)
Sodium: 138 mmol/L (ref 135–145)
Total Bilirubin: 0.4 mg/dL (ref 0.3–1.2)
Total Protein: 7 g/dL (ref 6.5–8.1)

## 2023-02-23 LAB — TROPONIN I (HIGH SENSITIVITY)
Troponin I (High Sensitivity): 5 ng/L (ref <18)
Troponin I (High Sensitivity): 5 ng/L (ref <18)

## 2023-02-23 LAB — D-DIMER, QUANTITATIVE: D-Dimer, Quant: 0.53 ug/mL-FEU — ABNORMAL HIGH (ref 0.00–0.50)

## 2023-02-23 MED ORDER — IOHEXOL 350 MG/ML SOLN
75.0000 mL | Freq: Once | INTRAVENOUS | Status: AC | PRN
  Administered 2023-02-23: 75 mL via INTRAVENOUS

## 2023-02-23 MED ORDER — SODIUM CHLORIDE 0.9 % IV BOLUS
500.0000 mL | Freq: Once | INTRAVENOUS | Status: AC
  Administered 2023-02-23: 500 mL via INTRAVENOUS

## 2023-02-23 NOTE — ED Provider Notes (Signed)
[place] EMERGENCY DEPARTMENT AT [facility] Provider Note   CSN: [id number] Arrival date & time: 02/23/23  0405     History  Chief Complaint  Patient presents with   Tachycardia   Dizziness   Chest Pain    Cory Floyd is a 47 y.o. male.  The history is provided by the patient and medical records.  Dizziness Associated symptoms: chest pain   Chest Pain Associated symptoms: dizziness   Cory Floyd is a 47 y.o. male who presents to the Emergency Department complaining of chest pain.  He presents the emergency department for evaluation of chest pain, difficulty breathing and dizziness that started just prior to ED arrival.  He states that he had been sitting with a friend and was playing a game for several hours.  After he stood up he began to feel very dizzy and had associated shortness of breath and chest pain.Cory Floyd  He does report that he gets short of breath when he smokes a cigarette and that has been ongoing for the last few weeks.  He does have a history of substance abuse and had been clean until a few days ago when he used crack for 2 days straight.  He reports that he has been trying to stay hydrated.  He was feeling well until just prior to ED arrival.  He did have diaphoresis when he went to stand.  He denies any hallucinations, SI, HI.  He has not been taking blood pressure medications as they were discontinued following his recent hospitalization.     Home Medications Prior to Admission medications   Medication Sig Start Date End Date Taking? Authorizing Provider  ARIPiprazole (ABILIFY) 10 MG tablet Take 1 tablet (10 mg total) by mouth daily. 01/22/23   Clapacs, Jackquline Denmark, MD  fluticasone (FLONASE) 50 MCG/ACT nasal spray Place 1 spray into both nostrils daily. 01/23/23   Clapacs, Jackquline Denmark, MD  mirtazapine (REMERON) 15 MG tablet Take 1 tablet (15 mg total) by mouth at bedtime. 01/22/23   Clapacs, Jackquline Denmark, MD  traZODone (DESYREL) 150 MG tablet Take 1 tablet (150 mg total)  by mouth at bedtime as needed for sleep. 01/22/23   Clapacs, Jackquline Denmark, MD      Allergies    Carrot [daucus carota], Penicillins, and Principen [ampicillin]    Review of Systems   Review of Systems  Cardiovascular:  Positive for chest pain.  Neurological:  Positive for dizziness.  All other systems reviewed and are negative.   Physical Exam Updated Vital Signs BP 118/78   Pulse 94   Temp 98.1 F (36.7 C) (Oral)   Resp (!) 21   Ht 5\' 10"  (1.778 m)   Wt 106.6 kg   SpO2 97%   BMI 33.72 kg/m  Physical Exam Vitals and nursing note reviewed.  Constitutional:      Appearance: He is well-developed.  HENT:     Head: Normocephalic and atraumatic.  Cardiovascular:     Rate and Rhythm: Regular rhythm. Tachycardia present.     Heart sounds: No murmur heard. Pulmonary:     Effort: Pulmonary effort is normal. No respiratory distress.     Breath sounds: Normal breath sounds.  Abdominal:     Palpations: Abdomen is soft.     Tenderness: There is no abdominal tenderness. There is no guarding or rebound.  Musculoskeletal:        General: No swelling or tenderness.  Skin:    General: Skin is warm and dry.  Neurological:     Mental Status: He is alert and oriented to person, place, and time.  Psychiatric:        Behavior: Behavior normal.     ED Results / Procedures / Treatments   Labs (all labs ordered are listed, but only abnormal results are displayed) Labs Reviewed  COMPREHENSIVE METABOLIC PANEL - Abnormal; Notable for the following components:      Result Value   Potassium 3.4 (*)    All other components within normal limits  CBC WITH DIFFERENTIAL/PLATELET - Abnormal; Notable for the following components:   RBC 4.04 (*)    Hemoglobin 12.1 (*)    HCT 37.0 (*)    All other components within normal limits  D-DIMER, QUANTITATIVE - Abnormal; Notable for the following components:   D-Dimer, Quant 0.53 (*)    All other components within normal limits  TROPONIN I (HIGH  SENSITIVITY)  TROPONIN I (HIGH SENSITIVITY)    EKG EKG Interpretation  Date/Time:  Friday Feb 23 2023 04:20:13 EDT Ventricular Rate:  112 PR Interval:  128 QRS Duration: 96 QT Interval:  338 QTC Calculation: 462 R Axis:   49 Text Interpretation: Sinus tachycardia Confirmed by Tilden Fossa [phone]) on 02/23/2023 4:22:20 AM  Radiology DG Chest Port 1 View  Result Date: 02/23/2023 CLINICAL DATA:  47 year old male with history of chest pain. EXAM: PORTABLE CHEST 1 VIEW COMPARISON:  Chest x-ray 01/25/2023. FINDINGS: Lung volumes are normal. No consolidative airspace disease. No pleural effusions. No pneumothorax. No pulmonary nodule or mass noted. Pulmonary vasculature and the cardiomediastinal silhouette are within normal limits. IMPRESSION: No radiographic evidence of acute cardiopulmonary disease. Electronically Signed   By: Trudie Reed M.D.   On: 02/23/2023 05:29    Procedures Procedures    Medications Ordered in ED Medications  sodium chloride 0.9 % bolus 500 mL (0 mLs Intravenous Stopped 02/23/23 0549)    ED Course/ Medical Decision Making/ A&P                             Medical Decision Making Amount and/or Complexity of Data Reviewed Labs: ordered. Radiology: ordered.   Patient here for evaluation of chest pain, difficulty breathing and near syncopal episode that started after he went to stand.  EKG with sinus tachycardia, no acute ischemic changes.  He was recently in the hospital and a D-dimer was obtained, which is mildly elevated.  He does not have any lower extremity evidence of DVT.  Plan to obtain CTA to rule out PE.  Patient care transferred pending CTA and repeat troponin.        Final Clinical Impression(s) / ED Diagnoses Final diagnoses:  None    Rx / DC Orders ED Discharge Orders     None         Tilden Fossa, MD 02/23/23 [phone]

## 2023-02-23 NOTE — ED Triage Notes (Signed)
Patient was sitting at gas station and playing game. When he stood, he became dizzy and short of breath. Tried ETOH to calm down, did not work. Reports using meth and crack over 24 hours ago. Tachycardic in 140s, 12 lead normal with EMS. 500 cc by EMS.  99 % RA  CBG 224  BP 136/72 140s HR  

## 2023-02-23 NOTE — ED Notes (Signed)
Patient was sitting at gas station and playing game. When he stood, he became dizzy and short of breath. Tried ETOH to calm down, did not work. Reports using meth and crack over 24 hours ago. Tachycardic in 140s, 12 lead normal with EMS. 500 cc by EMS.  99 % RA  CBG 224  BP 136/72 140s HR

## 2023-02-23 NOTE — ED Provider Notes (Addendum)
  Physical Exam  BP 118/78   Pulse 94   Temp 98.1 F (36.7 C) (Oral)   Resp (!) 21   Ht 5\' 10"  (1.778 m)   Wt 106.6 kg   SpO2 97%   BMI 33.72 kg/m   Physical Exam  Procedures  Procedures  ED Course / MDM    Medical Decision Making Amount and/or Complexity of Data Reviewed Labs: ordered. Radiology: ordered.  Risk Prescription drug management.   Pt comes in with cc of CP, shob, diophoresis. Hx of SUD. Admits to cocaine use. Arrived tachycardic - HR has improved. Needs delta trop and CT-PE. Pt feels better, CP free at this time.    Cory Kaplan, MD 02/23/23 [phone]  8:39 AM Patient reassessed.  Discussed with him that the CAT scan is showing a pulmonary nodule.  He can follow-up about it with his PCP.  The patient appears reasonably screened and/or stabilized for discharge and I doubt any other medical condition or other [facility] requiring further screening, evaluation, or treatment in the ED at this time prior to discharge.   Results from the ER workup discussed with the patient face to face and all questions answered to the best of my ability. The patient is safe for discharge with strict return precautions.    Cory Kaplan, MD 02/23/23 [phone]

## 2023-02-23 NOTE — Discharge Instructions (Signed)
Substance Abuse Treatment Programs ° °Intensive Outpatient Programs °[facility]     °[address]      °[place], [place]                   °[phone]      ° °[facility] °[address] °[place], [place] °[phone] ° °[facility]     °(Inpatient and outpatient)     °[address]           °[phone]   ° °[facility] °[phone] (Suboxone and Methadone) ° °[address]      °[place], [place] [address]      °336-882-2125      ° °[address] °[place], [place] °852-3033 ° °[facility] (Outpatient/Inpatient, Chemical)    °(insurance only) [phone]      °       °[facility] (Groups & Residential) °[place], [place] °336-389-1413 ° °   °[facility]     °[address]     °[place], [place]      °[phone]      ° °[facility] (for caregivers and family) °[address] °[place], [place] °336-299-4655 ° ° ° ° ° °Residential Treatment Programs °[facility]      °[address], [place], [place] [address]  °[phone]      ° °[facility] °[address] [place], [place] [address] °[phone] ° °[facility]        °336-248-8914      ° °[facility] °[phone] ° °[facility]             °[address]                                         °[place], [place]                                                °877-615-2722 or 336-784-9470                              ° °[facility] [place] °[address] °[place], [address] °[phone] ° °[facility]    °[address]      °[place], [place]     °[phone]      ° °[facility] °[address] °, [place] °336-285-9073 ° °[facility]   °[address]     °[place], [place] [address]     °[phone]      °Admissions: 8am-3pm M-F ° °[facility] °[address] °,  [place] °336-227-7417 ° °[facility]: Residential Program (90 Days)   °[place], [place]      °336-725-8389 or 800-758-6077    ° °[facility] °[place], [place] °(Walk in Hours over the weekend or by referral) ° °[facility] °[address], [place],  [address] °([phone] ° °Crisis Mobile: [facility]:  1-877-626-1772 (for crisis response 24 hours a day) °[facility] Hotline:      1-800-256-2452 °Outpatient Psychiatry and Counseling ° °[facility]: Mobile Crisis   Management 24 hours:  1-877-626-1772 ° °[facility] sliding scale fee and walk in schedule: M-F 8am-12pm/1pm-3pm °[address]  °[place], [place] [address] °[phone] ° °[facility] °[address] °[place], [place] [address] °336-703-9650 ° °[facility] (Former[facility] known [facility])- new patient walk-in appointments available Monday - Friday 8am -3pm.          °[address] °[place], [place] [address] °336-676-6840 or crisis line- 336-676-6905 ° °[facility] Intensive Outpatient Therapy Program °[address] °[place], [place] [address] °336-832-9804 ° °[facility]                  °Crisis Services      °[phone]      °[address]     °[place], [place] [address]                ° °[facility]   °[facility] °[phone] °[address] °[place], [place] [address] ° ° °[facility]          °[address],  °[place], [place] [address]       °[phone] ° °[facility] °[address] °[place], [place] [address] °336-292-1510 ° °[facility]    °[address]    °[place], [place] [address]     °[phone]      ° °Parish McKinney, MD     °[address]     °[place] [address]     °336-282-1251     °  °[facility] °[address] °[place] [address] ° °[facility]     °[address]     °[place], [place]      °336-542-2076      ° °[facility] °Shamsher Ahluwalia, MD °[address] °[place], [place] [address] °[phone] ° °[facility]     °[address]     °[place], [place] [address]     °336-274-1237      ° °[facility] °[address] °[place], [place] [address] °336-854-4450 °Resources for Temporary Residential Assistance/Crisis Centers ° °DAY CENTERS °[facility] °M-F 8am-3pm   °[address] [place], [place] [address]   336-332-0824 °Services include: laundry, barbering, support groups, case management, phone  & computer access, showers, AA/NA mtgs, mental health/substance abuse nurse, job skills class, disability information, VA assistance, spiritual classes, etc.  ° °HOMELESS SHELTERS ° °[facility]     °[facility]   °[address], [place]     °[phone]       °       °[facility] (women and children)       °[address] °, [place] [address] °336-275-0820 °[email or web address] for application and process °Application Required ° °[facility]   °[address]    °[place] [address]     °[phone]       °             °[facility] °[address] °, [place] [address] °336.273.5572 °336-235-0363(schedule application appt.) °Application Required ° °[facility] (women only)    °[address]     °[place], [place] [address]     °336-884-1039      °  Intake starts 6pm daily °Need valid ID, SSC, & Police report °[facility] [place] °[address] °[place], [place] °336-881-5420 °Application Required ° °[facility] (men only)     °[address]      °[place], [place]     °[phone]      ° °[facility] °(Pregnant women only) °[address] °[place], [place] °336-275-0206 ° °[facility]      °[address]      °[place], [place] [address]     °[phone]      °       °[facility] °[address] °[place], [place] °336-723-1848 °90 day commitment/SA/Application process ° °[facility](men only)     °[address]     °[place], [name]     °[phone]       °Check-in at 7pm     °       °[facility] °[address] °[place], [place] [address] °336-248-6684 °Men/Women/Women and Children must be there by 7 pm ° °[facility] °[place],  °336-722-8721                ° °

## 2023-04-01 ENCOUNTER — Emergency Department (HOSPITAL_COMMUNITY): Payer: Medicare (Managed Care)

## 2023-04-01 ENCOUNTER — Other Ambulatory Visit: Payer: Self-pay

## 2023-04-01 ENCOUNTER — Encounter (HOSPITAL_COMMUNITY): Payer: Self-pay | Admitting: Pharmacy Technician

## 2023-04-01 ENCOUNTER — Emergency Department (HOSPITAL_COMMUNITY)
Admission: EM | Admit: 2023-04-01 | Discharge: 2023-04-01 | Disposition: A | Discharge status: Home / Self Care | Payer: Medicare (Managed Care) | Attending: Emergency Medicine | Admitting: Emergency Medicine

## 2023-04-01 DIAGNOSIS — K5792 Diverticulitis of intestine, part unspecified, without perforation or abscess without bleeding: Secondary | ICD-10-CM | POA: Diagnosis not present

## 2023-04-01 DIAGNOSIS — F1990 Other psychoactive substance use, unspecified, uncomplicated: Secondary | ICD-10-CM | POA: Diagnosis not present

## 2023-04-01 DIAGNOSIS — R109 Unspecified abdominal pain: Secondary | ICD-10-CM | POA: Diagnosis present

## 2023-04-01 LAB — CBC
HCT: 45.4 % (ref 39.0–52.0)
Hemoglobin: 14.5 g/dL (ref 13.0–17.0)
MCH: 29.4 pg (ref 26.0–34.0)
MCHC: 31.9 g/dL (ref 30.0–36.0)
MCV: 92.1 fL (ref 80.0–100.0)
Platelets: 195 10*3/uL (ref 150–400)
RBC: 4.93 MIL/uL (ref 4.22–5.81)
RDW: 13.9 % (ref 11.5–15.5)
WBC: 5.7 10*3/uL (ref 4.0–10.5)
nRBC: 0 % (ref 0.0–0.2)

## 2023-04-01 LAB — BASIC METABOLIC PANEL
Anion gap: 13 (ref 5–15)
BUN: 12 mg/dL (ref 6–20)
CO2: 22 mmol/L (ref 22–32)
Calcium: 9.3 mg/dL (ref 8.9–10.3)
Chloride: 103 mmol/L (ref 98–111)
Creatinine, Ser: 1.2 mg/dL (ref 0.61–1.24)
GFR, Estimated: >60 mL/min (ref >60)
Glucose, Bld: 79 mg/dL (ref 70–99)
Potassium: 3.7 mmol/L (ref 3.5–5.1)
Sodium: 138 mmol/L (ref 135–145)

## 2023-04-01 LAB — URINALYSIS, ROUTINE W REFLEX MICROSCOPIC
Glucose, UA: NEGATIVE mg/dL
Hgb urine dipstick: NEGATIVE
Ketones, ur: NEGATIVE mg/dL
Leukocytes,Ua: NEGATIVE
Nitrite: NEGATIVE
Protein, ur: 100 mg/dL — AB
Specific Gravity, Urine: 1.024 (ref 1.005–1.030)
pH: 6 (ref 5.0–8.0)

## 2023-04-01 LAB — CK: Total CK: 81 U/L (ref 49–397)

## 2023-04-01 LAB — TROPONIN I (HIGH SENSITIVITY): Troponin I (High Sensitivity): 5 ng/L (ref <18)

## 2023-04-01 MED ORDER — METRONIDAZOLE 500 MG PO TABS: 500.0000 mg | ORAL_TABLET | Freq: Two times a day (BID) | ORAL | 0 refills | Status: DC

## 2023-04-01 MED ORDER — IOHEXOL 350 MG/ML SOLN
75.0000 mL | Freq: Once | INTRAVENOUS | Status: AC | PRN
  Administered 2023-04-01: 75 mL via INTRAVENOUS

## 2023-04-01 MED ORDER — CIPROFLOXACIN HCL 500 MG PO TABS: 500.0000 mg | ORAL_TABLET | Freq: Two times a day (BID) | ORAL | 0 refills | Status: DC

## 2023-04-01 MED ORDER — CIPROFLOXACIN HCL 500 MG PO TABS
500.0000 mg | ORAL_TABLET | Freq: Once | ORAL | Status: AC
  Administered 2023-04-01: 500 mg via ORAL
  Filled 2023-04-01: qty 1

## 2023-04-01 MED ORDER — METRONIDAZOLE 500 MG PO TABS
500.0000 mg | ORAL_TABLET | Freq: Once | ORAL | Status: AC
  Administered 2023-04-01: 500 mg via ORAL
  Filled 2023-04-01: qty 1

## 2023-04-01 NOTE — ED Notes (Signed)
Per lab Bmet hemolyzed. Will recollect.

## 2023-04-01 NOTE — ED Provider Notes (Signed)
[facility] EMERGENCY DEPARTMENT AT [facility] Provider Note   CSN: [id number] Arrival date & time: 04/01/23  1215    History  Chief Complaint  Patient presents with   Flank Pain    Cory Floyd is a 47 y.o. male history of polysubstance use (IVDU), methamphetamines, cocaine here for evaluation of left flank pain.  Intermittent over the last week or so.  Not worse with movement.  Does not radiate.  Pain to very distal lateral side.  He is unsure if he has history of kidney stones.  No overt chest pain was seen approximately 1 month ago for chest pain.  States this feels different.  Symptoms not exertional, nonpleuritic in nature.  He has no midline back pain, no numbness, weakness, pain to legs.  Pain does not extend into groin.  No dysuria or hematuria.  No fever, nausea, vomiting, cough, midline neck or back pain.  Not take anything for his pain.  States he was recently incarcerated had an ultrasound of the kidneys" he does not know the results of this or why this was done.  He was admitted approximately 2 months ago for AKI in setting of rhabdomyolysis and methamphetamine use he denies any known history of epidural abscess, endocarditis.  Last IV drug use 2 days ago.  Usually drinks alcohol.  No recent falls, injuries.  Eating and drinking normally.  No diarrhea, constipation.  No bloody stool.  No pain, swelling to scrotum.  No recent surgeries.  Does not inject in his abdomen.  HPI     Home Medications Prior to Admission medications   Medication Sig Start Date End Date Taking? Authorizing Provider  ciprofloxacin (CIPRO) 500 MG tablet Take 1 tablet (500 mg total) by mouth every 12 (twelve) hours. 04/01/23  Yes Elvie Maines A, [name]  metroNIDAZOLE (FLAGYL) 500 MG tablet Take 1 tablet (500 mg total) by mouth 2 (two) times daily. 04/01/23  Yes Carolanne Mercier A, [name]  ARIPiprazole (ABILIFY) 10 MG tablet Take 1 tablet (10 mg total) by mouth daily. 01/22/23   Clapacs, Jackquline Denmark, MD   fluticasone (FLONASE) 50 MCG/ACT nasal spray Place 1 spray into both nostrils daily. 01/23/23   Clapacs, Jackquline Denmark, MD  mirtazapine (REMERON) 15 MG tablet Take 1 tablet (15 mg total) by mouth at bedtime. 01/22/23   Clapacs, Jackquline Denmark, MD  traZODone (DESYREL) 150 MG tablet Take 1 tablet (150 mg total) by mouth at bedtime as needed for sleep. 01/22/23   Clapacs, Jackquline Denmark, MD      Allergies    Carrot [daucus carota], Penicillins, and Principen [ampicillin]    Review of Systems   Review of Systems  Constitutional: Negative.  Negative for chills and fever.  HENT: Negative.  Negative for ear pain and sore throat.   Eyes:  Negative for pain and visual disturbance.  Respiratory: Negative.  Negative for cough and shortness of breath.   Cardiovascular: Negative.  Negative for chest pain and palpitations.  Gastrointestinal: Negative.  Negative for abdominal pain and vomiting.  Genitourinary:  Positive for flank pain. Negative for decreased urine volume, difficulty urinating, dysuria, enuresis, frequency, genital sores, hematuria, penile discharge, penile pain, penile swelling, scrotal swelling, testicular pain and urgency.  Musculoskeletal:  Negative for arthralgias and back pain.  Skin: Negative.  Negative for color change and rash.  Neurological: Negative.  Negative for seizures and syncope.  All other systems reviewed and are negative.  Physical Exam Updated Vital Signs BP (!) 128/90   Pulse 86  Temp 98 F (36.7 C) (Oral)   Resp 15   Ht 5\' 10"  (1.778 m)   Wt 104.3 kg   SpO2 100%   BMI 33.00 kg/m  Physical Exam Vitals and nursing note reviewed.  Constitutional:      General: He is not in acute distress.    Appearance: He is well-developed. He is not ill-appearing, toxic-appearing or diaphoretic.  HENT:     Head: Normocephalic and atraumatic.     Nose: Nose normal.     Mouth/Throat:     Mouth: Mucous membranes are moist.  Eyes:     Pupils: Pupils are equal, round, and reactive to light.   Cardiovascular:     Rate and Rhythm: Normal rate and regular rhythm.     Pulses: Normal pulses.     Heart sounds: Normal heart sounds.     Comments: No obvious murmur Pulmonary:     Effort: Pulmonary effort is normal. No respiratory distress.     Breath sounds: Normal breath sounds.     Comments: Clear bilaterally, speaks in full sentences without difficulty Abdominal:     General: Bowel sounds are normal. There is no distension.     Palpations: Abdomen is soft.     Tenderness: There is abdominal tenderness in the left lower quadrant. There is no right CVA tenderness, left CVA tenderness, guarding or rebound.       Comments: Soft.  Negative CVA tap bilaterally.  Minimal tenderness to left lateral mid abdomen and LLQ.  No rebound or guarding  Musculoskeletal:        General: Normal range of motion.     Cervical back: Normal range of motion and neck supple.       Back:     Comments: No midline C/T/L tenderness.  Compartments soft.  Lifts bilateral arms overhead.  No shortening or rotation of legs.  Skin:    General: Skin is warm and dry.     Capillary Refill: Capillary refill takes less than 2 seconds.     Comments: No edema, erythema, warmth, fluctuance or induration.  Tract marks bilateral arms, no erythema, fluctuance or induration.  No drainage  Neurological:     General: No focal deficit present.     Mental Status: He is alert and oriented to person, place, and time.    ED Results / Procedures / Treatments   Labs (all labs ordered are listed, but only abnormal results are displayed) Labs Reviewed  URINALYSIS, ROUTINE W REFLEX MICROSCOPIC - Abnormal; Notable for the following components:      Result Value   Color, Urine AMBER (*)    APPearance HAZY (*)    Bilirubin Urine SMALL (*)    Protein, ur 100 (*)    Bacteria, UA FEW (*)    All other components within normal limits  CBC  BASIC METABOLIC PANEL  CK  TROPONIN I (HIGH SENSITIVITY)  TROPONIN I (HIGH SENSITIVITY)     EKG EKG Interpretation  Date/Time:  Sunday April 01 2023 16:17:14 EDT Ventricular Rate:  94 PR Interval:  163 QRS Duration: 103 QT Interval:  369 QTC Calculation: 462 R Axis:   26 Text Interpretation: Sinus rhythm Low voltage, precordial leads Confirmed by Gerhard Munch [phone]) on 04/01/2023 5:00:23 PM  Radiology CT ABDOMEN PELVIS W CONTRAST  Result Date: 04/01/2023 CLINICAL DATA:  Left-sided abdominal pain. History of IV drug use. Tachycardia. EXAM: CT ABDOMEN AND PELVIS WITH CONTRAST TECHNIQUE: Multidetector CT imaging of the abdomen and pelvis was performed using  the standard protocol following bolus administration of intravenous contrast. Performed with concurrent chest CTA, reported separately. RADIATION DOSE REDUCTION: This exam was performed according to the departmental dose-optimization program which includes automated exposure control, adjustment of the mA and/or kV according to patient size and/or use of iterative reconstruction technique. CONTRAST:  75mL OMNIPAQUE IOHEXOL 350 MG/ML SOLN COMPARISON:  CT 12/26/2019 FINDINGS: Lower chest: Assessed fully on concurrent chest CTA, reported separately. Hepatobiliary: No focal hepatic abnormality. Slightly lobulated hepatic contours again seen. Unremarkable appearance of the gallbladder. No biliary dilatation. Pancreas: No ductal dilatation or inflammation. Spleen: Stable, mildly enlarged spanning 13.3 cm cranial caudal. No focal abnormality. Adrenals/Urinary Tract: Normal adrenal glands. No hydronephrosis, renal inflammation or renal calculi. Previous right renal stone is not seen. Exophytic cyst in the lower pole of the left kidney, needs no further imaging follow-up. Minimally distended urinary bladder, normal for degree of distension. Stomach/Bowel: Unremarkable appearance of the stomach. Occasional fluid-filled small bowel without obstruction or wall thickening. The appendix is normal. Left colonic diverticulosis. There may be mild faint  pericolonic edema about the mid sigmoid, series 5, image 71, possible mild acute diverticulitis no abscess or perforation. Vascular/Lymphatic: Aortic atherosclerosis without aneurysm. Patent portal, splenic and mesenteric veins. No abdominopelvic adenopathy. Reproductive: Prostate is unremarkable. Other: No ascites or free air. Small fat containing umbilical hernia. Musculoskeletal: Lumbar degenerative change without inflammatory or destructive component. Bone island in the right proximal femur. No intramuscular collection. IMPRESSION: 1. Left colonic diverticulosis with mild faint pericolonic edema about the mid sigmoid, possible mild acute diverticulitis. No abscess or perforation. 2. Stable mild splenomegaly. Aortic Atherosclerosis (ICD10-I70.0). Electronically Signed   By: Narda Rutherford M.D.   On: 04/01/2023 16:33   CT Angio Chest PE W and/or Wo Contrast  Result Date: 04/01/2023 CLINICAL DATA:  PE suspected, chest pain, shortness of breath EXAM: CT ANGIOGRAPHY CHEST WITH CONTRAST TECHNIQUE: Multidetector CT imaging of the chest was performed using the standard protocol during bolus administration of intravenous contrast. Multiplanar CT image reconstructions and MIPs were obtained to evaluate the vascular anatomy. RADIATION DOSE REDUCTION: This exam was performed according to the departmental dose-optimization program which includes automated exposure control, adjustment of the mA and/or kV according to patient size and/or use of iterative reconstruction technique. CONTRAST:  75mL OMNIPAQUE IOHEXOL 350 MG/ML SOLN COMPARISON:  02/23/2023 FINDINGS: Cardiovascular: Satisfactory opacification of the pulmonary arteries to the segmental level. No evidence of pulmonary embolism. Cardiomegaly. Left and right coronary artery calcifications. No pericardial effusion. Aortic atherosclerosis Mediastinum/Nodes: No enlarged mediastinal, hilar, or axillary lymph nodes. Thyroid gland, trachea, and esophagus demonstrate no  significant findings. Lungs/Pleura: Diffuse bilateral bronchial wall thickening. No pleural effusion or pneumothorax. Upper Abdomen: No acute abnormality.  Colonic diverticulosis. Musculoskeletal: No chest wall abnormality. No acute osseous findings. Review of the MIP images confirms the above findings. IMPRESSION: 1. Negative examination for pulmonary embolism. 2. Diffuse bilateral bronchial wall thickening, consistent with nonspecific infectious or inflammatory bronchitis. 3. Cardiomegaly and coronary artery disease. Aortic Atherosclerosis (ICD10-I70.0). Electronically Signed   By: Jearld Lesch M.D.   On: 04/01/2023 16:25    Procedures Procedures    Medications Ordered in ED Medications  iohexol (OMNIPAQUE) 350 MG/ML injection 75 mL (75 mLs Intravenous Contrast Given 04/01/23 1603)  ciprofloxacin (CIPRO) tablet 500 mg (500 mg Oral Given 04/01/23 1730)  metroNIDAZOLE (FLAGYL) tablet 500 mg (500 mg Oral Given 04/01/23 1730)    ED Course/ Medical Decision Making/ A&P   47 year old history of IV drug use with recent use here for evaluation  of left flank pain.  Vague historian, sounds like this is been intermittent over the last week.  He denies any chest pain, shortness of breath, urinary symptoms, diarrhea, bloody stool.  He has no midline back pain, fever.  No radicular symptoms.  I considered spinal etiology such as discitis, osteomyelitis, epidural abscess but has history of IV drug use however he has no midline tenderness on exam, points to his very lateral flank.  Symptoms do not seem consistent with neurosurgical emergency do not feel he needs midline back imaging at this time.  Tachycardic here into the 120s.  No hypoxia, tachypnea.  No upper respiratory complaints however given location of pain will plan on CT chest, abdomen.  He does not appear septic.  Appears comfortable in room and does not need anything for pain at this time.  Plan on labs.  Labs and imaging personally viewed and  interpreted:  CBC no leukocytosis, hemoglobin 14.5 Metabolic panel without significant abnormality CK 81 Troponin 5 UA negative for infection, no blood EKG wo ischemic changes CTA poss bronchitis CT abdomen pelvis mild diverticulitis  Patient reassessed.  We discussed his labs and imaging.  He has no cough, shortness of breath to suggest bronchitis.  With regards to his abdominal pain he does state that he has had some looser stools however would not grossly consider diarrhea.  Given location of pain, CT findings will treat for diverticulitis.  Anaphylaxis to penicillins will do Cipro, Flagyl.  Has not required anything for pain here.  Low suspicion for acute ACS, PE, dissection, abscess, acute spinal process, endocarditis, pneumonia, infected kidney stone, obstruction, perforation  The patient has been appropriately medically screened and/or stabilized in the ED. I have low suspicion for any other emergent medical condition which would require further screening, evaluation or treatment in the ED or require inpatient management.  Patient is hemodynamically stable and in no acute distress.  Patient able to ambulate in department prior to ED.  Evaluation does not show acute pathology that would require ongoing or additional emergent interventions while in the emergency department or further inpatient treatment.  I have discussed the diagnosis with the patient and answered all questions.  Pain is been managed while in the emergency department and patient has no further complaints prior to discharge.  Patient is comfortable with plan discussed in room and is stable for discharge at this time.  I have discussed strict return precautions for returning to the emergency department.  Patient was encouraged to follow-up with PCP/specialist refer to at discharge.                              Medical Decision Making Amount and/or Complexity of Data Reviewed External Data Reviewed: labs, radiology, ECG  and notes. Labs: ordered. Decision-making details documented in ED Course. Radiology: ordered and independent interpretation performed. Decision-making details documented in ED Course. ECG/medicine tests: ordered and independent interpretation performed. Decision-making details documented in ED Course.  Risk OTC drugs. Prescription drug management. Parenteral controlled substances. Decision regarding hospitalization. Diagnosis or treatment significantly limited by social determinants of health.         Final Clinical Impression(s) / ED Diagnoses Final diagnoses:  Diverticulitis  Polysubstance use disorder    Rx / DC Orders ED Discharge Orders          Ordered    ciprofloxacin (CIPRO) 500 MG tablet  Every 12 hours        04/01/23 1751  metroNIDAZOLE (FLAGYL) 500 MG tablet  2 times daily        04/01/23 1751              Yavonne Kiss A, [name] 04/01/23 1802    Gerhard Munch, MD 04/01/23 2229

## 2023-04-01 NOTE — Discharge Instructions (Addendum)
Started you on antibiotics  Take as prescribed  Follow-up outpatient  Return for new or worsening symptoms

## 2023-04-01 NOTE — ED Triage Notes (Signed)
Pt here with reports of L flank/back pain worsening over the last few days. Endorses nausea when the pain is severe.

## 2023-05-28 ENCOUNTER — Other Ambulatory Visit (HOSPITAL_COMMUNITY): Payer: Self-pay

## 2023-06-08 ENCOUNTER — Emergency Department: Payer: Medicare (Managed Care)

## 2023-06-08 ENCOUNTER — Emergency Department
Admission: EM | Admit: 2023-06-08 | Discharge: 2023-06-08 | Disposition: A | Discharge status: Home / Self Care | Payer: Medicare (Managed Care) | Source: Home / Self Care | Attending: Emergency Medicine | Admitting: Emergency Medicine

## 2023-06-08 ENCOUNTER — Other Ambulatory Visit: Payer: Self-pay

## 2023-06-08 DIAGNOSIS — I10 Essential (primary) hypertension: Secondary | ICD-10-CM | POA: Diagnosis not present

## 2023-06-08 DIAGNOSIS — R456 Violent behavior: Secondary | ICD-10-CM | POA: Insufficient documentation

## 2023-06-08 DIAGNOSIS — R4182 Altered mental status, unspecified: Secondary | ICD-10-CM | POA: Insufficient documentation

## 2023-06-08 DIAGNOSIS — S4991XA Unspecified injury of right shoulder and upper arm, initial encounter: Secondary | ICD-10-CM | POA: Diagnosis present

## 2023-06-08 DIAGNOSIS — X58XXXA Exposure to other specified factors, initial encounter: Secondary | ICD-10-CM | POA: Diagnosis not present

## 2023-06-08 DIAGNOSIS — S40021A Contusion of right upper arm, initial encounter: Secondary | ICD-10-CM | POA: Diagnosis not present

## 2023-06-08 DIAGNOSIS — F1910 Other psychoactive substance abuse, uncomplicated: Secondary | ICD-10-CM | POA: Insufficient documentation

## 2023-06-08 LAB — CBC WITH DIFFERENTIAL/PLATELET
Abs Immature Granulocytes: 0.01 10*3/uL (ref 0.00–0.07)
Basophils Absolute: 0 10*3/uL (ref 0.0–0.1)
Basophils Relative: 1 %
Eosinophils Absolute: 0.1 10*3/uL (ref 0.0–0.5)
Eosinophils Relative: 1 %
HCT: 46 % (ref 39.0–52.0)
Hemoglobin: 14.7 g/dL (ref 13.0–17.0)
Immature Granulocytes: 0 %
Lymphocytes Relative: 24 %
Lymphs Abs: 1.7 10*3/uL (ref 0.7–4.0)
MCH: 29.2 pg (ref 26.0–34.0)
MCHC: 32 g/dL (ref 30.0–36.0)
MCV: 91.3 fL (ref 80.0–100.0)
Monocytes Absolute: 0.3 10*3/uL (ref 0.1–1.0)
Monocytes Relative: 5 %
Neutro Abs: 4.9 10*3/uL (ref 1.7–7.7)
Neutrophils Relative %: 69 %
Platelets: 215 10*3/uL (ref 150–400)
RBC: 5.04 MIL/uL (ref 4.22–5.81)
RDW: 14.6 % (ref 11.5–15.5)
WBC: 7 10*3/uL (ref 4.0–10.5)
nRBC: 0 % (ref 0.0–0.2)

## 2023-06-08 LAB — COMPREHENSIVE METABOLIC PANEL
ALT: 31 U/L (ref 0–44)
AST: 31 U/L (ref 15–41)
Albumin: 4.4 g/dL (ref 3.5–5.0)
Alkaline Phosphatase: 67 U/L (ref 38–126)
Anion gap: 10 (ref 5–15)
BUN: 11 mg/dL (ref 6–20)
CO2: 27 mmol/L (ref 22–32)
Calcium: 9.4 mg/dL (ref 8.9–10.3)
Chloride: 101 mmol/L (ref 98–111)
Creatinine, Ser: 0.99 mg/dL (ref 0.61–1.24)
GFR, Estimated: >60 mL/min (ref >60)
Glucose, Bld: 85 mg/dL (ref 70–99)
Potassium: 3.8 mmol/L (ref 3.5–5.1)
Sodium: 138 mmol/L (ref 135–145)
Total Bilirubin: 1.3 mg/dL — ABNORMAL HIGH (ref 0.3–1.2)
Total Protein: 8.6 g/dL — ABNORMAL HIGH (ref 6.5–8.1)

## 2023-06-08 LAB — URINE DRUG SCREEN, QUALITATIVE (ARMC ONLY)
Amphetamines, Ur Screen: POSITIVE — AB
Barbiturates, Ur Screen: NOT DETECTED
Benzodiazepine, Ur Scrn: POSITIVE — AB
Cannabinoid 50 Ng, Ur ~~LOC~~: POSITIVE — AB
Cocaine Metabolite,Ur ~~LOC~~: POSITIVE — AB
MDMA (Ecstasy)Ur Screen: NOT DETECTED
Methadone Scn, Ur: NOT DETECTED
Opiate, Ur Screen: NOT DETECTED
Phencyclidine (PCP) Ur S: NOT DETECTED
Tricyclic, Ur Screen: NOT DETECTED

## 2023-06-08 LAB — ETHANOL: Alcohol, Ethyl (B): <10 mg/dL (ref <10)

## 2023-06-08 NOTE — ED Notes (Signed)
Patient is sleeping on stretcher. Respirations are even. Deputies remain at bedside.
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
## 2023-06-08 NOTE — ED Notes (Signed)
Pt refused labs and EKG

## 2023-06-08 NOTE — Discharge Instructions (Addendum)
Patient is currently cleared for incarceration.  Arrived with multiple illicit substances in his urine drug screen.  At this time patient is cleared for incarceration.
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
## 2023-06-08 NOTE — ED Notes (Signed)
Patient was taken to CT scan by CT tech
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
## 2023-06-08 NOTE — ED Notes (Signed)
Deputies remain at bedside. Cuffs remain off. Patient is sleeping, but easily awakened. Alert &oriented x4. Patient is calm and cooperative.

## 2023-06-08 NOTE — ED Provider Notes (Signed)
-----------------------------------------   7:58 PM on 06/08/2023 -----------------------------------------  Blood pressure 119/63, pulse 100, temperature (!) 97.5 F (36.4 C), temperature source Oral, resp. rate 20, height 5\' 10"  (1.778 m), weight 93 kg, SpO2 98%.  Assuming care from Dr. Larinda Buttery.  In short, Cory Floyd is a 47 y.o. male with a chief complaint of Dizziness .  Refer to the original H&P for additional details.  The current plan of care is to await until patient is awake enough to be discharged for incarceration.  Patient has a pansensitive of UDS with amphetamines, cocaine, cannabinoids and benzodiazepine.  Feel that his drowsiness is likely secondary to the benzodiazepine.  Patient is rousable, remainder of labs and imaging are reassuring.  At this time with the patient's amount of drowsiness do not feel that he is quite ready for discharge for incarceration.  Once patient is more alert and awake patient will be discharged in custody of law enforcement for incarceration.  ----------------------------------------- 8:54 PM on 06/08/2023 -----------------------------------------   Patient is now awake, alert, stable for discharge.  Patient will be released into law enforcement custody.  Cleared for incarceration.    Lanette Hampshire 06/08/23 2055    Chesley Noon, MD 06/09/23 [phone]

## 2023-09-26 ENCOUNTER — Other Ambulatory Visit: Payer: Self-pay

## 2023-12-25 ENCOUNTER — Emergency Department (HOSPITAL_COMMUNITY)
Admission: EM | Admit: 2023-12-25 | Discharge: 2023-12-25 | Disposition: A | Discharge status: Home / Self Care | Payer: Medicare (Managed Care) | Attending: Emergency Medicine | Admitting: Emergency Medicine

## 2023-12-25 ENCOUNTER — Other Ambulatory Visit: Payer: Self-pay

## 2023-12-25 ENCOUNTER — Emergency Department (HOSPITAL_COMMUNITY): Payer: Medicare (Managed Care)

## 2023-12-25 ENCOUNTER — Encounter (HOSPITAL_COMMUNITY): Payer: Self-pay | Admitting: Emergency Medicine

## 2023-12-25 DIAGNOSIS — N200 Calculus of kidney: Secondary | ICD-10-CM | POA: Diagnosis not present

## 2023-12-25 DIAGNOSIS — R079 Chest pain, unspecified: Secondary | ICD-10-CM | POA: Diagnosis not present

## 2023-12-25 DIAGNOSIS — Z79899 Other long term (current) drug therapy: Secondary | ICD-10-CM | POA: Diagnosis not present

## 2023-12-25 DIAGNOSIS — R109 Unspecified abdominal pain: Secondary | ICD-10-CM | POA: Diagnosis present

## 2023-12-25 LAB — CBC
HCT: 44.6 % (ref 39.0–52.0)
Hemoglobin: 14.2 g/dL (ref 13.0–17.0)
MCH: 29.8 pg (ref 26.0–34.0)
MCHC: 31.8 g/dL (ref 30.0–36.0)
MCV: 93.7 fL (ref 80.0–100.0)
Platelets: 312 10*3/uL (ref 150–400)
RBC: 4.76 MIL/uL (ref 4.22–5.81)
RDW: 13.8 % (ref 11.5–15.5)
WBC: 8.2 10*3/uL (ref 4.0–10.5)
nRBC: 0 % (ref 0.0–0.2)

## 2023-12-25 LAB — URINALYSIS, ROUTINE W REFLEX MICROSCOPIC
Bilirubin Urine: NEGATIVE
Glucose, UA: NEGATIVE mg/dL
Hgb urine dipstick: NEGATIVE
Ketones, ur: 20 mg/dL — AB
Leukocytes,Ua: NEGATIVE
Nitrite: NEGATIVE
Protein, ur: 30 mg/dL — AB
Specific Gravity, Urine: 1.027 (ref 1.005–1.030)
pH: 5 (ref 5.0–8.0)

## 2023-12-25 LAB — BASIC METABOLIC PANEL
Anion gap: 11 (ref 5–15)
BUN: 18 mg/dL (ref 6–20)
CO2: 22 mmol/L (ref 22–32)
Calcium: 9.6 mg/dL (ref 8.9–10.3)
Chloride: 105 mmol/L (ref 98–111)
Creatinine, Ser: 1.39 mg/dL — ABNORMAL HIGH (ref 0.61–1.24)
GFR, Estimated: >60 mL/min (ref >60)
Glucose, Bld: 94 mg/dL (ref 70–99)
Potassium: 3.7 mmol/L (ref 3.5–5.1)
Sodium: 138 mmol/L (ref 135–145)

## 2023-12-25 LAB — RAPID URINE DRUG SCREEN, HOSP PERFORMED
Amphetamines: POSITIVE — AB
Barbiturates: NOT DETECTED
Benzodiazepines: NOT DETECTED
Cocaine: POSITIVE — AB
Opiates: NOT DETECTED
Tetrahydrocannabinol: NOT DETECTED

## 2023-12-25 LAB — TROPONIN I (HIGH SENSITIVITY)
Troponin I (High Sensitivity): 7 ng/L (ref <18)
Troponin I (High Sensitivity): 4 ng/L (ref <18)

## 2023-12-25 LAB — CK: Total CK: 172 U/L (ref 49–397)

## 2023-12-25 MED ORDER — ALUM & MAG HYDROXIDE-SIMETH 200-200-20 MG/5ML PO SUSP
15.0000 mL | Freq: Once | ORAL | Status: AC
  Administered 2023-12-25: 15 mL via ORAL
  Filled 2023-12-25: qty 30

## 2023-12-25 MED ORDER — PANTOPRAZOLE SODIUM 40 MG IV SOLR
40.0000 mg | Freq: Once | INTRAVENOUS | Status: AC
  Administered 2023-12-25: 40 mg via INTRAVENOUS
  Filled 2023-12-25: qty 10

## 2023-12-25 MED ORDER — SODIUM CHLORIDE 0.9 % IV BOLUS
1000.0000 mL | Freq: Once | INTRAVENOUS | Status: AC
  Administered 2023-12-25: 1000 mL via INTRAVENOUS

## 2023-12-25 NOTE — ED Provider Notes (Signed)
 [place] EMERGENCY DEPARTMENT AT [facility] Provider Note   CSN: [id number] Arrival date & time: 12/25/23  4098     History  Chief Complaint  Patient presents with   Chest Pain    Cory Floyd is a 48 y.o. male with hx of GERD, OSA on CPAP, bipolar 1 disorder, stable angina, polysubstance dependence, substance induced mood disorder, who presents the emergency department complaining of chest pain.  Patient states that chest pain started a few hours ago after using crack cocaine.  States that he has been using cocaine, methamphetamines, and drinking alcohol.  He states that he has been awake for the past 3 days.  He has not been taking his other prescribed occasions for the same amount of time.  Also mention some right sided flank pain, states it feels similarly to when he had a kidney stone in the past.  He takes Flomax, and has frequent urination because of it.  Intermittently having some urinary symptoms for the past 2 months, but states his urine has looked darker as of recently.  EMS gave patient 1 tablet of nitroglycerin, did not help pain.  Chest Pain Associated symptoms: abdominal pain        Home Medications Prior to Admission medications   Medication Sig Start Date End Date Taking? Authorizing Provider  ARIPiprazole (ABILIFY) 10 MG tablet Take 1 tablet (10 mg total) by mouth daily. 01/22/23   Clapacs, Jackquline Denmark, MD  ARIPiprazole ER (ABILIFY MAINTENA) 400 MG PRSY prefilled syringe Inject 400 mg into the muscle every 28 (twenty-eight) days. 11/13/23   [provider]  ciprofloxacin (CIPRO) 500 MG tablet Take 1 tablet (500 mg total) by mouth every 12 (twelve) hours. 04/01/23   Henderly, Britni A, [name]  fluticasone (FLONASE) 50 MCG/ACT nasal spray Place 1 spray into both nostrils daily. 01/23/23   Clapacs, Jackquline Denmark, MD  metroNIDAZOLE (FLAGYL) 500 MG tablet Take 1 tablet (500 mg total) by mouth 2 (two) times daily. 04/01/23   Henderly, Britni A, [name]  mirtazapine  (REMERON) 15 MG tablet Take 1 tablet (15 mg total) by mouth at bedtime. 01/22/23   Clapacs, Jackquline Denmark, MD  traZODone (DESYREL) 150 MG tablet Take 1 tablet (150 mg total) by mouth at bedtime as needed for sleep. 01/22/23   Clapacs, Jackquline Denmark, MD      Allergies    Carrot [daucus carota], Penicillins, and Principen [ampicillin]    Review of Systems   Review of Systems  Cardiovascular:  Positive for chest pain.  Gastrointestinal:  Positive for abdominal pain.  Genitourinary:  Positive for flank pain.  All other systems reviewed and are negative.   Physical Exam Updated Vital Signs BP 126/85   Pulse 89   Temp 97.8 F (36.6 C)   Resp 16   Ht 5\' 10"  (1.778 m)   Wt 93 kg   SpO2 100%   BMI 29.41 kg/m  Physical Exam Vitals and nursing note reviewed.  Constitutional:      General: He is sleeping.     Appearance: Normal appearance.  HENT:     Head: Normocephalic and atraumatic.  Eyes:     Conjunctiva/sclera: Conjunctivae normal.  Cardiovascular:     Rate and Rhythm: Normal rate and regular rhythm.  Pulmonary:     Effort: Pulmonary effort is normal. No respiratory distress.     Breath sounds: Normal breath sounds.  Abdominal:     General: There is no distension.     Palpations: Abdomen is soft.  Tenderness: There is abdominal tenderness in the epigastric area. There is no guarding or rebound.  Skin:    General: Skin is warm and dry.  Neurological:     General: No focal deficit present.     Mental Status: He is easily aroused.     ED Results / Procedures / Treatments   Labs (all labs ordered are listed, but only abnormal results are displayed) Labs Reviewed  BASIC METABOLIC PANEL - Abnormal; Notable for the following components:      Result Value   Creatinine, Ser 1.39 (*)    All other components within normal limits  RAPID URINE DRUG SCREEN, HOSP PERFORMED - Abnormal; Notable for the following components:   Cocaine POSITIVE (*)    Amphetamines POSITIVE (*)    All other  components within normal limits  URINALYSIS, ROUTINE W REFLEX MICROSCOPIC - Abnormal; Notable for the following components:   Color, Urine AMBER (*)    APPearance HAZY (*)    Ketones, ur 20 (*)    Protein, ur 30 (*)    Bacteria, UA FEW (*)    All other components within normal limits  CBC  CK  TROPONIN I (HIGH SENSITIVITY)  TROPONIN I (HIGH SENSITIVITY)    EKG EKG Interpretation Date/Time:  Tuesday December 25 2023 06:23:33 EST Ventricular Rate:  113 PR Interval:  122 QRS Duration:  84 QT Interval:  336 QTC Calculation: 460 R Axis:   -38  Text Interpretation: Sinus tachycardia Left axis deviation Possible Anterior infarct , age undetermined Abnormal ECG When compared with ECG of 08-Jun-2023 18:42, PREVIOUS ECG IS PRESENT Confirmed by Marily Memos [phone]) on 12/25/2023 6:34:41 AM  Radiology CT Renal Stone Study Result Date: 12/25/2023 CLINICAL DATA:  Abdominal and flank pain.  Diverticulosis. EXAM: CT ABDOMEN AND PELVIS WITHOUT CONTRAST TECHNIQUE: Multidetector CT imaging of the abdomen and pelvis was performed following the standard protocol without IV contrast. RADIATION DOSE REDUCTION: This exam was performed according to the departmental dose-optimization program which includes automated exposure control, adjustment of the mA and/or kV according to patient size and/or use of iterative reconstruction technique. COMPARISON:  04/01/2023 FINDINGS: Lower chest: No acute findings. Hepatobiliary: No mass visualized on this unenhanced exam. Hypertrophy of caudate and left lobes is consistent with cirrhosis. No No evidence of ascites. Gallbladder is unremarkable. No evidence of biliary ductal dilatation. Pancreas: No mass or inflammatory process visualized on this unenhanced exam. Spleen:  Within normal limits in size. Adrenals/Urinary tract: A few 1 mm right renal calculi are noted. No evidence of ureteral calculi or hydronephrosis. Stable small left lower pole renal cyst. Unremarkable unopacified  urinary bladder. Stomach/Bowel: No evidence of obstruction, inflammatory process, or abnormal fluid collections. Normal appendix visualized. Mild colonic diverticulosis is again seen, without signs of diverticulitis. Vascular/Lymphatic: No pathologically enlarged lymph nodes identified. No evidence of abdominal aortic aneurysm. Reproductive:  No mass or other significant abnormality. Other:  None. Musculoskeletal:  No suspicious bone lesions identified. IMPRESSION: Several punctate right renal calculi. No evidence of ureteral calculi, hydronephrosis, or other acute findings. Mild colonic diverticulosis, without radiographic evidence of diverticulitis. Hepatic cirrhosis. Electronically Signed   By: Danae Orleans M.D.   On: 12/25/2023 12:32   DG Chest 2 View Result Date: 12/25/2023 CLINICAL DATA:  48 year old male with chest pain status post cocaine. EXAM: CHEST - 2 VIEW COMPARISON:  CTA chest 04/01/2023 and earlier. FINDINGS: PA and lateral views 0639 hours. Lung volumes and mediastinal contours are within normal limits. Visualized tracheal air column is within normal  limits. No pneumothorax, pulmonary edema, pleural effusion or confluent lung opacity. No acute osseous abnormality identified. Negative visible bowel gas. IMPRESSION: No acute cardiopulmonary abnormality. Electronically Signed   By: Odessa Fleming M.D.   On: 12/25/2023 07:10    Procedures Procedures    Medications Ordered in ED Medications  sodium chloride 0.9 % bolus 1,000 mL (1,000 mLs Intravenous New Bag/Given 12/25/23 0845)  pantoprazole (PROTONIX) injection 40 mg (40 mg Intravenous Given 12/25/23 1031)  alum & mag hydroxide-simeth (MAALOX/MYLANTA) 200-200-20 MG/5ML suspension 15 mL (15 mLs Oral Given 12/25/23 1031)    ED Course/ Medical Decision Making/ A&P             HEART Score: 3                    Medical Decision Making Amount and/or Complexity of Data Reviewed Labs: ordered. Radiology: ordered.  This patient is a 48 y.o. male   who presents to the ED for concern of chest pain.   Differential diagnoses prior to evaluation: The emergent differential diagnosis includes, but is not limited to,  ACS, pericarditis, myocarditis, aortic dissection, PE, pneumothorax, esophageal rupture, pneumonia, reflux/PUD, biliary disease, pancreatitis, costochondritis, anxiety. This is not an exhaustive differential.   Past Medical History / Co-morbidities / Social History: GERD, OSA on CPAP, bipolar 1 disorder, stable angina, polysubstance dependence, substance induced mood disorder  Additional history: Chart reviewed. Pertinent results include: Patient most recently seen for polysubstance abuse in August 2024, prior to that in April 2024 he required hospitalization for acute kidney injury and rhabdomyolysis in the setting of cocaine and methamphetamine use.  Physical Exam: Physical exam performed. The pertinent findings include: Initially tachycardic, but on my exam vital signs have stabilized and are normal.  Patient was sleeping, but was easily aroused for exam.  Heart regular rate and rhythm, lung sounds clear.  Epigastric tenderness to palpation without guarding.  Lab Tests/Imaging studies: I personally interpreted labs/imaging and the pertinent results include: CBC normal.  BMP with mildly elevated creatinine compared to prior.  UA with some ketones and protein, CK normal.  Negative troponin x 2.  Chest x-ray without acute abnormalities.  CT renal stone study with several punctuate right renal calculi, no obstruction. I agree with the radiologist interpretation.  Cardiac monitoring: EKG obtained and interpreted by myself and attending physician which shows: Sinus tachycardia with rate of 113 bpm   Medications: I ordered medication including IVF, Protonix, Maalox.  I have reviewed the patients home medicines and have made adjustments as needed.   Disposition: After consideration of the diagnostic results and the patients response  to treatment, I feel that emergency department workup does not suggest an emergent condition requiring admission or immediate intervention beyond what has been performed at this time. Patient is to be discharged with recommendation to follow up with PCP in regards to today's hospital visit. Chest pain is not likely of cardiac or pulmonary etiology d/t presentation, Well's criteria for PE low risk, VSS, no tracheal deviation, no JVD or new murmur, RRR, breath sounds equal bilaterally, EKG without acute abnormalities, negative troponin, and negative CXR. Heart score of 3. Pt has been advised to return to the ED if CP becomes exertional, associated with diaphoresis or nausea, radiates to left jaw/arm, worsens or becomes concerning in any way. Pt appears reliable for follow up and is agreeable to discharge.   Final Clinical Impression(s) / ED Diagnoses Final diagnoses:  Renal calculi  Chest pain, unspecified type  Rx / DC Orders ED Discharge Orders     None      Portions of this report may have been transcribed using voice recognition software. Every effort was made to ensure accuracy; however, inadvertent computerized transcription errors may be present.    Su Monks, [name] 12/25/23 1239    Alvira Monday, MD 12/26/23 1210

## 2023-12-25 NOTE — ED Triage Notes (Signed)
 Pt reports that he "took a big 1/2 gram of crack cocaine" and immediately felt burning in his chest.  Pt also admits to ETOH all day.  Pt states it tasted different and he got it from a different place.  Admits to IV drug use.     110HR 142/92 99% RA 20 18G Left wrist 1 nitro given, no change in pain.

## 2023-12-25 NOTE — Discharge Instructions (Signed)
 You were seen in the ER for chest pain.  Overall your workup looked very reassuring. We evaluated for increased demand on your heart and this was normal. Your EKG and chest x-ray looked normal. Your CT scan showed some small kidney stones on the right side, but they are not obstructing urine flow. Continue taking your flomax and hydrating well.  Continue to monitor how you're doing and return to the ER for new or worsening symptoms.

## 2023-12-25 NOTE — ED Notes (Signed)
Pt was able to ambulate to restroom without assistance. 

## 2024-04-02 ENCOUNTER — Other Ambulatory Visit: Payer: Self-pay

## 2024-04-02 ENCOUNTER — Emergency Department (EMERGENCY_DEPARTMENT_HOSPITAL)
Admission: EM | Admit: 2024-04-02 | Discharge: 2024-04-02 | Disposition: A | Discharge status: Home / Self Care | Source: Home / Self Care | Attending: Emergency Medicine | Admitting: Emergency Medicine

## 2024-04-02 ENCOUNTER — Encounter (HOSPITAL_COMMUNITY): Payer: Self-pay

## 2024-04-02 ENCOUNTER — Emergency Department (HOSPITAL_COMMUNITY)
Admission: EM | Admit: 2024-04-02 | Discharge: 2024-04-02 | Disposition: A | Discharge status: Home / Self Care | Attending: Emergency Medicine | Admitting: Emergency Medicine

## 2024-04-02 DIAGNOSIS — Z760 Encounter for issue of repeat prescription: Secondary | ICD-10-CM | POA: Insufficient documentation

## 2024-04-02 DIAGNOSIS — I10 Essential (primary) hypertension: Secondary | ICD-10-CM | POA: Insufficient documentation

## 2024-04-02 DIAGNOSIS — Z765 Malingerer [conscious simulation]: Secondary | ICD-10-CM | POA: Diagnosis not present

## 2024-04-02 DIAGNOSIS — E119 Type 2 diabetes mellitus without complications: Secondary | ICD-10-CM | POA: Insufficient documentation

## 2024-04-02 DIAGNOSIS — R443 Hallucinations, unspecified: Secondary | ICD-10-CM | POA: Diagnosis present

## 2024-04-02 DIAGNOSIS — E876 Hypokalemia: Secondary | ICD-10-CM | POA: Insufficient documentation

## 2024-04-02 LAB — COMPREHENSIVE METABOLIC PANEL WITH GFR
ALT: 32 U/L (ref 0–44)
AST: 30 U/L (ref 15–41)
Albumin: 3.6 g/dL (ref 3.5–5.0)
Alkaline Phosphatase: 62 U/L (ref 38–126)
Anion gap: 10 (ref 5–15)
BUN: 20 mg/dL (ref 6–20)
CO2: 23 mmol/L (ref 22–32)
Calcium: 8.6 mg/dL — ABNORMAL LOW (ref 8.9–10.3)
Chloride: 106 mmol/L (ref 98–111)
Creatinine, Ser: 0.79 mg/dL (ref 0.61–1.24)
GFR, Estimated: >60 mL/min (ref >60)
Glucose, Bld: 170 mg/dL — ABNORMAL HIGH (ref 70–99)
Potassium: 3 mmol/L — ABNORMAL LOW (ref 3.5–5.1)
Sodium: 139 mmol/L (ref 135–145)
Total Bilirubin: 0.6 mg/dL (ref 0.0–1.2)
Total Protein: 7.2 g/dL (ref 6.5–8.1)

## 2024-04-02 LAB — CBC WITH DIFFERENTIAL/PLATELET
Abs Immature Granulocytes: 0.01 10*3/uL (ref 0.00–0.07)
Basophils Absolute: 0 10*3/uL (ref 0.0–0.1)
Basophils Relative: 1 %
Eosinophils Absolute: 0.1 10*3/uL (ref 0.0–0.5)
Eosinophils Relative: 1 %
HCT: 41.7 % (ref 39.0–52.0)
Hemoglobin: 13.3 g/dL (ref 13.0–17.0)
Immature Granulocytes: 0 %
Lymphocytes Relative: 25 %
Lymphs Abs: 1.4 10*3/uL (ref 0.7–4.0)
MCH: 30 pg (ref 26.0–34.0)
MCHC: 31.9 g/dL (ref 30.0–36.0)
MCV: 93.9 fL (ref 80.0–100.0)
Monocytes Absolute: 0.4 10*3/uL (ref 0.1–1.0)
Monocytes Relative: 6 %
Neutro Abs: 3.8 10*3/uL (ref 1.7–7.7)
Neutrophils Relative %: 67 %
Platelets: 162 10*3/uL (ref 150–400)
RBC: 4.44 MIL/uL (ref 4.22–5.81)
RDW: 14.2 % (ref 11.5–15.5)
WBC: 5.7 10*3/uL (ref 4.0–10.5)
nRBC: 0 % (ref 0.0–0.2)

## 2024-04-02 LAB — RAPID URINE DRUG SCREEN, HOSP PERFORMED
Amphetamines: NOT DETECTED
Barbiturates: NOT DETECTED
Benzodiazepines: NOT DETECTED
Cocaine: POSITIVE — AB
Opiates: NOT DETECTED
Tetrahydrocannabinol: NOT DETECTED

## 2024-04-02 LAB — ACETAMINOPHEN LEVEL: Acetaminophen (Tylenol), Serum: <10 ug/mL — ABNORMAL LOW (ref 10–30)

## 2024-04-02 LAB — ETHANOL: Alcohol, Ethyl (B): <15 mg/dL (ref <15)

## 2024-04-02 LAB — SALICYLATE LEVEL: Salicylate Lvl: <7 mg/dL — ABNORMAL LOW (ref 7.0–30.0)

## 2024-04-02 MED ORDER — ONDANSETRON 4 MG PO TBDP
4.0000 mg | ORAL_TABLET | Freq: Once | ORAL | Status: AC
  Administered 2024-04-02: 4 mg via ORAL
  Filled 2024-04-02: qty 1

## 2024-04-02 MED ORDER — RISPERIDONE 1 MG PO TBDP
1.0000 mg | ORAL_TABLET | Freq: Once | ORAL | Status: AC
  Administered 2024-04-02: 1 mg via ORAL
  Filled 2024-04-02 (×2): qty 1

## 2024-04-02 MED ORDER — HYDROCODONE-ACETAMINOPHEN 5-325 MG PO TABS
1.0000 | ORAL_TABLET | Freq: Once | ORAL | Status: AC
  Administered 2024-04-02: 1 via ORAL
  Filled 2024-04-02: qty 1

## 2024-04-02 MED ORDER — ARIPIPRAZOLE 10 MG PO TABS: 10.0000 mg | ORAL_TABLET | Freq: Every day | ORAL | 0 refills | Status: DC
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
## 2024-04-02 NOTE — ED Triage Notes (Signed)
 The patient is here requesting his mental health medications. He has not had any of them in the last month due to recent incarceration. He reports he is hearing voices telling him that someone wants to hurt him. Denies SI/HI.

## 2024-04-02 NOTE — Discharge Instructions (Addendum)
 Today you were seen for medication refill.  Please follow-up with [facility] for further evaluation and workup.  Please pick up your medication from the pharmacy and take as directed.  Thank you for letting us  treat you today. After reviewing your labs and imaging, I feel you are safe to go home. Please follow up with your PCP in the next several days and provide them with your records from this visit. Return to the Emergency Room if pain becomes severe or symptoms worsen.

## 2024-04-02 NOTE — ED Notes (Signed)
 Urinal left at bedside patient aware urine sample is needed

## 2024-04-02 NOTE — ED Provider Notes (Signed)
 [place] EMERGENCY DEPARTMENT AT [address] [facility] Provider Note   CSN: [id number] Arrival date & time: 04/02/24  0535     History  Chief Complaint  Patient presents with   Hallucinations    ESGAR BARNICK is a 48 y.o. male.  48 year old male presents today for concern of hallucinations.  He does have history of bipolar disorder, schizophrenia, hypertension, diabetes.  He was seen last night for medication refill.  He received a dose of Risperdal , and Abilify  was refilled.  He states he returned to where he was staying and felt uneasy and felt like he needed help so he return for evaluation.  Still denies SI, HI.  Endorses issues getting good sleep recently.  He does also report paranoia and feels that people are after him.  The history is provided by the patient. No language interpreter was used.       Home Medications Prior to Admission medications   Medication Sig Start Date End Date Taking? Authorizing Provider  ARIPiprazole  (ABILIFY ) 10 MG tablet Take 1 tablet (10 mg total) by mouth daily. 01/22/23   Clapacs, Elida Grounds, MD  ARIPiprazole  (ABILIFY ) 10 MG tablet Take 1 tablet (10 mg total) by mouth daily. 04/02/24   Keith, Kayla N, [name]  ARIPiprazole  ER (ABILIFY  MAINTENA) 400 MG PRSY prefilled syringe Inject 400 mg into the muscle every 28 (twenty-eight) days. 11/13/23   [provider]  ciprofloxacin  (CIPRO ) 500 MG tablet Take 1 tablet (500 mg total) by mouth every 12 (twelve) hours. 04/01/23   Henderly, Britni A, [name]  fluticasone  (FLONASE ) 50 MCG/ACT nasal spray Place 1 spray into both nostrils daily. 01/23/23   Clapacs, Elida Grounds, MD  metroNIDAZOLE  (FLAGYL ) 500 MG tablet Take 1 tablet (500 mg total) by mouth 2 (two) times daily. 04/01/23   Henderly, Britni A, [name]  mirtazapine  (REMERON ) 15 MG tablet Take 1 tablet (15 mg total) by mouth at bedtime. 01/22/23   Clapacs, Elida Grounds, MD  traZODone  (DESYREL ) 150 MG tablet Take 1 tablet (150 mg total) by mouth at bedtime as needed for  sleep. 01/22/23   Clapacs, Elida Grounds, MD      Allergies    Carrot [daucus carota], Penicillins, and Principen [ampicillin]    Review of Systems   Review of Systems  Constitutional:  Negative for chills and fever.  Respiratory:  Negative for shortness of breath.   Psychiatric/Behavioral:  Positive for sleep disturbance. Negative for suicidal ideas.   All other systems reviewed and are negative.   Physical Exam Updated Vital Signs BP 115/72   Pulse 65   Temp 98.9 F (37.2 C) (Oral)   Resp 18   Ht 5' 10 (1.778 m)   Wt 93 kg   SpO2 99%   BMI 29.41 kg/m  Physical Exam Vitals and nursing note reviewed.  Constitutional:      General: He is not in acute distress.    Appearance: Normal appearance. He is not ill-appearing.  HENT:     Head: Normocephalic and atraumatic.     Nose: Nose normal.  Eyes:     Conjunctiva/sclera: Conjunctivae normal.  Cardiovascular:     Rate and Rhythm: Normal rate and regular rhythm.  Pulmonary:     Effort: Pulmonary effort is normal. No respiratory distress.  Musculoskeletal:        General: No deformity.  Skin:    Findings: No rash.  Neurological:     Mental Status: He is alert.  Psychiatric:  Speech: Speech normal.        Thought Content: Thought content is paranoid. Thought content does not include homicidal or suicidal ideation. Thought content does not include homicidal or suicidal plan.     ED Results / Procedures / Treatments   Labs (all labs ordered are listed, but only abnormal results are displayed) Labs Reviewed - No data to display  EKG None  Radiology No results found.  Procedures Procedures    Medications Ordered in ED Medications - No data to display  ED Course/ Medical Decision Making/ A&P                                 Medical Decision Making Amount and/or Complexity of Data Reviewed Labs: ordered.  Risk Prescription drug management.   48 year old male presented for concern of  hallucinations. Labs obtained.  Without acute concern. Denies SI or HI. TTS consult for medication adjustment/recommendations. Seen and cleared by psych for discharge. Patient discharged in stable condition.   Final Clinical Impression(s) / ED Diagnoses Final diagnoses:  Hallucinations    Rx / DC Orders ED Discharge Orders     None         Lucina Sabal, [name] 04/02/24 1353    Albertus Hughs, DO 04/02/24 1446

## 2024-04-02 NOTE — Consult Note (Signed)
 [facility] Psychiatric Consult Initial  Patient Name: .Cory Floyd  MRN: [id number]  DOB: 1976-10-11  Consult Order details:  Orders (From admission, onward)     Start     Ordered   04/02/24 0716  CONSULT TO CALL ACT TEAM       Ordering Provider: Lucina Sabal, [name]  Provider:  (Not yet assigned)  Question:  Reason for Consult?  Answer:  med adjustment. hallucinations   04/02/24 0715             Floyd of Visit: In person    Psychiatry Consult Evaluation  Service Date: April 02, 2024 LOS:  LOS: 0 days  Chief Complaint I just needed a rest  Primary Psychiatric Diagnoses  Malingering  Assessment  Cory Floyd is a 48 y.o. male admitted: Presented to the EDfor 04/02/2024  5:35 AM for brought in by EMS from the street for hallucinations. Patient did not receive his medications while incarcerated. He carries the psychiatric diagnoses of bipolar 1 disorder, polysubstance abuse, suicidal ideation, alcohol use disorder and substance induced mood disorder and has a past medical history of OSA on CPAP, GERD, arthritis, AKI and stable angina pectoris.   His current presentation of I just needed a rest is most consistent with malingering. He meets criteria for outpatient psychiatric follow up based on not being a danger to himself and not being acutely psychotic.  Current outpatient psychotropic medications include abilify , depakote  and trazodone  and historically he has had a positive response to these medications. He was not compliant with medications prior to admission as evidenced by patient report. On initial examination, patient is calm and cooperative. Please see plan below for detailed recommendations.   Diagnoses:  Active Hospital problems: Principal Problem:   Malingering    Plan   ## Psychiatric Medication Recommendations:  Follow up with outpatient provider regarding  --abilify  --depakote  --trazodone   ## Medical Decision Making Capacity: Not specifically addressed  in this encounter  ## Further Work-up:  -- most recent EKG on 04/02/2024 had QtC of 456 -- Pertinent labwork reviewed earlier this admission includes: CBC, CMP, alcohol, salicylate and acetaminophen ; UDS is pending   ## Disposition:-- There are no psychiatric contraindications to discharge at this time  ## Behavioral / Environmental: - No specific recommendations at this time.     ## Safety and Observation Level:  - Based on my clinical evaluation, I estimate the patient to be at no risk of self harm in the current setting. - At this time, we recommend  routine. This decision is based on my review of the chart including patient's history and current presentation, interview of the patient, mental status examination, and consideration of suicide risk including evaluating suicidal ideation, plan, intent, suicidal or self-harm behaviors, risk factors, and protective factors. This judgment is based on our ability to directly address suicide risk, implement suicide prevention strategies, and develop a safety plan while the patient is in the clinical setting. Please contact our team if there is a concern that risk level has changed.  CSSR Risk Category:C-SSRS RISK CATEGORY: No Risk  Suicide Risk Assessment: Patient has following modifiable risk factors for suicide: medication noncompliance, which we are addressing by recommending outpatient psychiatric follow up. Patient has following non-modifiable or demographic risk factors for suicide: male gender and separation or divorce Patient has the following protective factors against suicide: Access to outpatient mental health care  Thank you for this consult request. Recommendations have been communicated to the primary team.  We will  sign off at this time.   Jeraline Moment, NP       History of Present Illness  Relevant Aspects of Hospital ED Course:  Admitted on 04/02/2024 brought in by EMS from the street for hallucinations. Patient did not receive  his medications while incarcerated. He carries the psychiatric diagnoses of bipolar 1 disorder, polysubstance abuse, suicidal ideation, alcohol use disorder and substance induced mood disorder and has a past medical history of OSA on CPAP, GERD, arthritis, AKI, fatty liver, DM and stable angina pectoris.    Patient Report:  Cory Floyd, is seen face to face by this provider, consulted with Dr. Deborah Falling; and chart reviewed on 04/02/24.  On evaluation Cory Floyd reports I just needed a rest.  He denies being a danger to himself or anyone else.  He wants to leave the hospital so he can check in with law enforcement which he is required to do every Wednesday.  He said his previous outpatient provider will no longer see him. Patient is educated about the walk in hours at [facility] for outpatient care.  Patient is agreeable to utilizing that service.     During evaluation Cory Floyd is laying on a stretcher in no acute distress.  He is alert & oriented x 4, calm, cooperative and attentive for this assessment.  His mood is euthymic with congruent affect.  He has normal speech, and behavior.  Objectively there is no evidence of psychosis/mania or delusional thinking. Pt does not appear to be responding to internal or external stimuli.  Patient is able to converse coherently, goal directed thoughts, no distractibility, or pre-occupation.  He denies suicidal/self-harm/homicidal ideation and psychosis; he does endorse feelings of paranoia.  Patient answered questions appropriately.    I personally spent a total of 50 minutes in the care of the patient today including preparing to see the patient, getting/reviewing separately obtained history, performing a medically appropriate exam/evaluation, counseling and educating, documenting clinical information in the EHR, independently interpreting results, and communicating results.  Psych ROS:  Depression: endorses Anxiety:  endorses Mania (lifetime and current):  endorses Psychosis: (lifetime and current): endorses  Review of Systems  Psychiatric/Behavioral:  The patient has insomnia.   All other systems reviewed and are negative.    Psychiatric and Social History  Psychiatric History:  Information collected from patient   Prev Dx/Sx: bipolar 1 disorder, polysubstance abuse, suicidal ideation, alcohol use disorder and substance induced mood disorder Current Psych Provider: none Home Meds (current): abilify , depakote , trazodone  Previous Med Trials: unknown Therapy: none  Prior Psych Hospitalization: yes  Prior Self Harm: denies Prior Violence: denies  Family Psych History: unknown Family Hx suicide: unknown  Social History:  Developmental Hx: WNL Educational Hx: unknown Occupational Hx: Banker at events Legal Hx: Is a registered sex offender Living Situation: homeless Spiritual Hx: unknown Access to weapons/lethal means: denies   Substance History Alcohol: endorses  Tobacco: endorses Illicit drugs: endorses Prescription drug abuse: denies Rehab hx: denies  Exam Findings  Physical Exam:  Vital Signs:  Temp:  [97.9 F (36.6 C)-98.9 F (37.2 C)] 97.9 F (36.6 C) (06/11 1218) Pulse Rate:  [65-137] 75 (06/11 1218) Resp:  [17-20] 18 (06/11 1218) BP: (103-161)/(70-108) 103/70 (06/11 1218) SpO2:  [96 %-100 %] 98 % (06/11 1218) Weight:  [93 kg] 93 kg (06/11 0548) Blood pressure 103/70, pulse 75, temperature 97.9 F (36.6 C), temperature source Oral, resp. rate 18, height 5' 10 (1.778 m), weight 93 kg, SpO2 98%.  Body mass index is 29.41 kg/m.  Physical Exam Vitals and nursing note reviewed.  Eyes:     Pupils: Pupils are equal, round, and reactive to light.  Pulmonary:     Effort: Pulmonary effort is normal.  Skin:    General: Skin is dry.  Neurological:     Mental Status: He is alert and oriented to person, place, and time.  Psychiatric:        Attention and Perception: Attention and perception  normal.        Mood and Affect: Mood and affect normal.        Speech: Speech normal.        Behavior: Behavior is cooperative.        Thought Content: Thought content is paranoid.        Cognition and Memory: Cognition and memory normal.     Mental Status Exam: General Appearance: Casual  Orientation:  Full (Time, Place, and Person)  Memory:  Immediate;   Good Recent;   Good Remote;   Good  Concentration:  Concentration: Good  Recall:  Good  Attention  Fair  Eye Contact:  Good  Speech:  Clear and Coherent  Language:  Good  Volume:  Normal  Mood: euthymic  Affect:  Congruent  Thought Process:  Coherent  Thought Content:  Logical  Suicidal Thoughts:  No  Homicidal Thoughts:  No  Judgement:  Impaired  Insight:  Shallow  Psychomotor Activity:  Normal  Akathisia:  No  Fund of Knowledge:  Good      Assets:  Communication Skills Leisure Time Physical Health  Cognition:  WNL  ADL's:  Intact  AIMS (if indicated):        Other History   These have been pulled in through the EMR, reviewed, and updated if appropriate.  Family History:  The patient's family history includes Cancer in his maternal aunt and mother; Hepatitis C in his mother; Hypertension in his maternal grandmother.  Medical History: Past Medical History:  Diagnosis Date   Alcoholic (HCC)    clean for 3 years   Anginal pain (HCC)    Arthritis    Bipolar 1 disorder (HCC)    COVID-19    Depressed    Diabetes mellitus without complication (HCC)    Dyspnea    Fatty liver    Hypertension    Schizophrenia (HCC)     Surgical History: Past Surgical History:  Procedure Laterality Date   COLONOSCOPY  02/05/2014   diverticulosis, hyperplastic polyp x 2   fracture arm Right    INCISION AND DRAINAGE ABSCESS Bilateral 12/29/2022   Procedure: INCISION AND DRAINAGE ABSCESS BILATERAL ARMS;  Surgeon: Laneta Pintos, MD;  Location: [facility] OR;  Service: Orthopedics;  Laterality: Bilateral;   KNEE SURGERY Right 2013    NASAL SEPTUM SURGERY     NASAL TURBINATE REDUCTION Bilateral 12/12/2016   Procedure: TURBINATE REDUCTION/SUBMUCOSAL RESECTION;  Surgeon: Lesly Raspberry, MD;  Location: [facility] ORS;  Service: ENT;  Laterality: Bilateral;     Medications:  No current facility-administered medications for this encounter.  Current Outpatient Medications:    ARIPiprazole  (ABILIFY ) 10 MG tablet, Take 1 tablet (10 mg total) by mouth daily., Disp: 30 tablet, Rfl: 0   ARIPiprazole  (ABILIFY ) 10 MG tablet, Take 1 tablet (10 mg total) by mouth daily., Disp: 30 tablet, Rfl: 0   ARIPiprazole  ER (ABILIFY  MAINTENA) 400 MG PRSY prefilled syringe, Inject 400 mg into the muscle every 28 (twenty-eight) days., Disp: , Rfl:    ciprofloxacin  (  CIPRO ) 500 MG tablet, Take 1 tablet (500 mg total) by mouth every 12 (twelve) hours., Disp: 10 tablet, Rfl: 0   fluticasone  (FLONASE ) 50 MCG/ACT nasal spray, Place 1 spray into both nostrils daily., Disp: 16 g, Rfl: 0   metroNIDAZOLE  (FLAGYL ) 500 MG tablet, Take 1 tablet (500 mg total) by mouth 2 (two) times daily., Disp: 14 tablet, Rfl: 0   mirtazapine  (REMERON ) 15 MG tablet, Take 1 tablet (15 mg total) by mouth at bedtime., Disp: 30 tablet, Rfl: 0   traZODone  (DESYREL ) 150 MG tablet, Take 1 tablet (150 mg total) by mouth at bedtime as needed for sleep., Disp: 30 tablet, Rfl: 0  Allergies: Allergies  Allergen Reactions   Carrot [Daucus Carota] Anaphylaxis and Rash   Penicillins Anaphylaxis and Hives    Has patient had a PCN reaction causing immediate rash, facial/tongue/throat swelling, SOB or lightheadedness with hypotension: Yes Has patient had a PCN reaction causing severe rash involving mucus membranes or skin necrosis: No Has patient had a PCN reaction that required hospitalization Yes Has patient had a PCN reaction occurring within the last 10 years: No If all of the above answers are NO, then may proceed with Cephalosporin use.    Principen [Ampicillin] Anaphylaxis and Hives     Mahagony Grieb A Emylee Decelle, NP

## 2024-04-02 NOTE — ED Provider Notes (Signed)
 Patient has been psychiatrically cleared.  Will discharge home with resources.   Albertus Hughs, DO 04/02/24 1348

## 2024-04-02 NOTE — ED Provider Notes (Signed)
 [place] EMERGENCY DEPARTMENT AT [facility] Provider Note   CSN: [id number] Arrival date & time: 04/02/24  0159     History  Chief Complaint  Patient presents with   Medication Refill    Cory Floyd is a 48 y.o. male past medical history significant for bipolar disorder, schizophrenia, hypertension, and diabetes presents today for medication refill.  Patient is requesting his mental health medications reports that he has not had any of them in the last month due to recent incarceration.  Patient endorses auditory hallucinations telling him that someone wants to hurt him.  Patient denies SI/HI.   Medication Refill      Home Medications Prior to Admission medications   Medication Sig Start Date End Date Taking? Authorizing Provider  ARIPiprazole  (ABILIFY ) 10 MG tablet Take 1 tablet (10 mg total) by mouth daily. 04/02/24  Yes Aran Menning N, [name]  ARIPiprazole  (ABILIFY ) 10 MG tablet Take 1 tablet (10 mg total) by mouth daily. 01/22/23   Clapacs, Elida Grounds, MD  ARIPiprazole  ER (ABILIFY  MAINTENA) 400 MG PRSY prefilled syringe Inject 400 mg into the muscle every 28 (twenty-eight) days. 11/13/23   [provider]  ciprofloxacin  (CIPRO ) 500 MG tablet Take 1 tablet (500 mg total) by mouth every 12 (twelve) hours. 04/01/23   Henderly, Britni A, [name]  fluticasone  (FLONASE ) 50 MCG/ACT nasal spray Place 1 spray into both nostrils daily. 01/23/23   Clapacs, Elida Grounds, MD  metroNIDAZOLE  (FLAGYL ) 500 MG tablet Take 1 tablet (500 mg total) by mouth 2 (two) times daily. 04/01/23   Henderly, Britni A, [name]  mirtazapine  (REMERON ) 15 MG tablet Take 1 tablet (15 mg total) by mouth at bedtime. 01/22/23   Clapacs, Elida Grounds, MD  traZODone  (DESYREL ) 150 MG tablet Take 1 tablet (150 mg total) by mouth at bedtime as needed for sleep. 01/22/23   Clapacs, Elida Grounds, MD      Allergies    Carrot [daucus carota], Penicillins, and Principen [ampicillin]    Review of Systems   Review of Systems   Psychiatric/Behavioral:  Positive for hallucinations.     Physical Exam Updated Vital Signs BP (!) 142/108 (BP Location: Right Arm)   Pulse (!) 114   Temp 98.3 F (36.8 C) (Oral)   Resp 18   Ht 5' 10 (1.778 m)   Wt 93 kg   SpO2 100%   BMI 29.41 kg/m  Physical Exam Vitals and nursing note reviewed.  Constitutional:      General: He is not in acute distress.    Appearance: Normal appearance. He is well-developed.  HENT:     Head: Normocephalic and atraumatic.     Right Ear: External ear normal.     Left Ear: External ear normal.     Nose: Nose normal.  Eyes:     Conjunctiva/sclera: Conjunctivae normal.  Cardiovascular:     Rate and Rhythm: Regular rhythm. Tachycardia present.     Pulses: Normal pulses.     Heart sounds: No murmur heard. Pulmonary:     Effort: Pulmonary effort is normal. No respiratory distress.     Breath sounds: Normal breath sounds.  Abdominal:     Palpations: Abdomen is soft.     Tenderness: There is no abdominal tenderness.  Musculoskeletal:        General: No swelling.     Cervical back: Normal range of motion and neck supple.  Skin:    General: Skin is warm and dry.     Capillary Refill: Capillary  refill takes less than 2 seconds.  Neurological:     General: No focal deficit present.     Mental Status: He is alert and oriented to person, place, and time.  Psychiatric:        Attention and Perception: He perceives auditory hallucinations.        Mood and Affect: Mood normal.        Thought Content: Thought content is paranoid.     ED Results / Procedures / Treatments   Labs (all labs ordered are listed, but only abnormal results are displayed) Labs Reviewed - No data to display  EKG EKG Interpretation Date/Time:  Wednesday April 02 2024 03:27:54 EDT Ventricular Rate:  116 PR Interval:  144 QRS Duration:  80 QT Interval:  324 QTC Calculation: 450 R Axis:   4  Text Interpretation: Sinus tachycardia Septal infarct , age  undetermined Abnormal ECG When compared with ECG of 25-Dec-2023 06:23, PREVIOUS ECG IS PRESENT Confirmed by Onetha Bile [phone]) on 04/02/2024 3:31:01 AM  Radiology No results found.  Procedures Procedures    Medications Ordered in ED Medications  risperiDONE  (RISPERDAL  M-TABS) disintegrating tablet 1 mg (1 mg Oral Given 04/02/24 6045)    ED Course/ Medical Decision Making/ A&P Clinical Course as of 04/02/24 0346  Wed Apr 02, 2024  0320 Agree as presented. Mildly decompensated. Wants to try oral meds and follow up at [facility]. [CC]    Clinical Course User Index [CC] Onetha Bile, MD                                 Medical Decision Making  This patient presents to the ED for concern of medication refill, this involves an extensive number of treatment options, and is a complaint that carries with it a high risk of complications and morbidity.  The differential diagnosis includes SI, HI, command auditory hallucinations, auditory hallucinations, schizophrenia, bipolar   Cardiac Monitoring: / EKG:  The patient was maintained on a cardiac monitor.  I personally viewed and interpreted the cardiac monitored which showed an underlying rhythm of: Sinus tach   Problem List / ED Course / Critical interventions / Medication management  I ordered medication including Risperdal  Reevaluation of the patient after these medicines showed that the patient no change I have reviewed the patients home medicines and have made adjustments as needed   Test / Admission - Considered:  Considered for further workup however patient's vital signs, physical exam, and EKG were reassuring.  Patient given dose of Risperdal  while in ED and given month refill of Abilify .  Patient to follow-up with [facility] for further evaluation workup.  Patient given return precautions.  I feel patient safe for discharge at this time.        Final Clinical Impression(s) / ED  Diagnoses Final diagnoses:  Medication refill    Rx / DC Orders ED Discharge Orders          Ordered    ARIPiprazole  (ABILIFY ) 10 MG tablet  Daily        04/02/24 0321              Carie Charity, [name] 04/02/24 4098    Onetha Bile, MD 04/02/24 [phone]

## 2024-04-02 NOTE — ED Notes (Signed)
 [facility] provided pt with resources for outpatient mental health and medical treatment, shelter, and free food programs.  Patt Boozer, [facility]  04/02/2024

## 2024-04-03 ENCOUNTER — Encounter (HOSPITAL_COMMUNITY): Payer: Self-pay

## 2024-04-03 ENCOUNTER — Other Ambulatory Visit: Payer: Self-pay

## 2024-04-03 ENCOUNTER — Emergency Department (HOSPITAL_COMMUNITY)
Admission: EM | Admit: 2024-04-03 | Discharge: 2024-04-03 | Disposition: A | Discharge status: Home / Self Care | Attending: Emergency Medicine | Admitting: Emergency Medicine

## 2024-04-03 DIAGNOSIS — E119 Type 2 diabetes mellitus without complications: Secondary | ICD-10-CM | POA: Insufficient documentation

## 2024-04-03 DIAGNOSIS — R440 Auditory hallucinations: Secondary | ICD-10-CM | POA: Diagnosis present

## 2024-04-03 DIAGNOSIS — F22 Delusional disorders: Secondary | ICD-10-CM | POA: Diagnosis not present

## 2024-04-03 DIAGNOSIS — I10 Essential (primary) hypertension: Secondary | ICD-10-CM | POA: Insufficient documentation

## 2024-04-03 MED ORDER — MIRTAZAPINE 15 MG PO TABS: 15.0000 mg | ORAL_TABLET | Freq: Every day | ORAL | 0 refills | Status: DC

## 2024-04-03 MED ORDER — TRAZODONE HCL 150 MG PO TABS: 150.0000 mg | ORAL_TABLET | Freq: Every evening | ORAL | 0 refills | Status: DC | PRN

## 2024-04-03 MED ORDER — ARIPIPRAZOLE 10 MG PO TABS: 10.0000 mg | ORAL_TABLET | Freq: Every day | ORAL | 0 refills | Status: DC

## 2024-04-03 NOTE — Discharge Instructions (Signed)
 Please go to behavioral health urgent care to get your Abilify  injection.

## 2024-04-03 NOTE — ED Provider Notes (Signed)
 [place] [facility]ENC[facility] DE[facility]MENT AT [facility] Provider Note   CSN: [id number] Arrival date & time: 04/03/24  0551     History  Chief Complaint  Patient presents with   Hallucinations    Cory Floyd is a 48 y.o. male.  The history is provided by the patient.  He has history of hypertension, diabetes, bipolar disorder, alcohol abuse and comes in because of ongoing auditory hallucinations and paranoid ideation.  He states that he has been having these problems for about a month but got worse last night after he used some cocaine.  He denies homicidal or suicidal ideation.  He is concerned because he has not been able to get back on his medications since being in prison for 3 weeks in March.  Of note, he had been seen at [facility] and here in the last 24-48 hours and did have a psychiatric evaluation.   Home Medications Prior to Admission medications   Medication Sig Start Date End Date Taking? Authorizing Provider  ARIPiprazole  (ABILIFY ) 10 MG tablet Take 1 tablet (10 mg total) by mouth daily. 01/22/23   Clapacs, Elida Grounds, MD  ARIPiprazole  (ABILIFY ) 10 MG tablet Take 1 tablet (10 mg total) by mouth daily. 04/02/24   Keith, Kayla N, [name]  ARIPiprazole  ER (ABILIFY  MAINTENA) 400 MG PRSY prefilled syringe Inject 400 mg into the muscle every 28 (twenty-eight) days. 11/13/23   [provider]  ciprofloxacin  (CIPRO ) 500 MG tablet Take 1 tablet (500 mg total) by mouth every 12 (twelve) hours. 04/01/23   Henderly, Britni A, [name]  fluticasone  (FLONASE ) 50 MCG/ACT nasal spray Place 1 spray into both nostrils daily. 01/23/23   Clapacs, Elida Grounds, MD  metroNIDAZOLE  (FLAGYL ) 500 MG tablet Take 1 tablet (500 mg total) by mouth 2 (two) times daily. 04/01/23   Henderly, Britni A, [name]  mirtazapine  (REMERON ) 15 MG tablet Take 1 tablet (15 mg total) by mouth at bedtime. 01/22/23   Clapacs, Elida Grounds, MD  traZODone  (DESYREL ) 150 MG tablet Take 1 tablet (150 mg total) by mouth at bedtime as  needed for sleep. 01/22/23   Clapacs, Elida Grounds, MD      Allergies    Carrot [daucus carota], Penicillins, and Principen [ampicillin]    Review of Systems   Review of Systems  All other systems reviewed and are negative.   Physical Exam Updated Vital Signs BP (!) 147/98   Pulse (!) 129   Temp 98.2 F (36.8 C)   Resp 18   SpO2 97%  Physical Exam Vitals and nursing note reviewed.   48 year old male, resting comfortably and in no acute distress. Vital signs are significant for elevated blood pressure and heart rate. Oxygen saturation is 97%, which is normal. Head is normocephalic and atraumatic. PERRLA, EOMI.  Lungs are clear without rales, wheezes, or rhonchi. Heart has regular rate and rhythm without murmur. Abdomen is soft, flat, nontender. Neurologic: Mental status is normal, cranial nerves are intact, there are no motor or sensory deficits. Psychiatric: Appears slightly anxious but speaks with normal inflections, does not appear to be responding to internal stimuli.  ED Results / Procedures / Treatments    Procedures Procedures    Medications Ordered in ED Medications - No data to display  ED Course/ Medical Decision Making/ A&P                                 Medical  Decision Making  Paranoid ideation and auditory hallucinations which have been worse since using cocaine.  I have reviewed his past records and note psychiatry consult yesterday where he was felt to be malingering although he did not have paranoid ideation and admitted to auditory hallucinations.  Today's exam seems essentially the same.  I do note tachycardia, and he has had tachycardia in previous visits.  I will observe him in the ED until heart rate is at a safe level.  I have advised him that he should go to behavioral health urgent care to get his Abilify  injection and I am giving him new prescriptions for his psychiatric medications.  He states that he had been taking valproic acid  but I note that  valproic acid  had been stopped when he was discharged from [facility] on 01/26/2023.  Repeat heart rate has come down significantly although it is still mildly elevated.  I feel he is safe for discharge.  I have instructed him to go to behavioral health urgent care to get his injection of the Abilify  and I have provided him with a resource guide for outpatient mental health resources.  Final Clinical Impression(s) / ED Diagnoses Final diagnoses:  Auditory hallucinations  Paranoid ideation [facility])    Rx / DC Orders ED Discharge Orders     None         Alissa April, MD 04/03/24 [phone]

## 2024-04-03 NOTE — ED Triage Notes (Signed)
 Pt reports auditory and visual hallucinations over the last couple days and he used cocaine last night and reports the hallucinations are worse. Pt denies any SI. Reports he has been out of his medication for the last month and would like his Abilify  injection.

## 2024-04-07 ENCOUNTER — Encounter (HOSPITAL_COMMUNITY): Payer: Self-pay

## 2024-04-07 ENCOUNTER — Other Ambulatory Visit: Payer: Self-pay

## 2024-04-07 ENCOUNTER — Emergency Department (HOSPITAL_COMMUNITY): Admission: EM | Admit: 2024-04-07 | Discharge: 2024-04-07 | Discharge status: Against Medical Advice

## 2024-04-07 ENCOUNTER — Emergency Department (HOSPITAL_COMMUNITY)

## 2024-04-07 DIAGNOSIS — R002 Palpitations: Secondary | ICD-10-CM | POA: Insufficient documentation

## 2024-04-07 DIAGNOSIS — Z5321 Procedure and treatment not carried out due to patient leaving prior to being seen by health care provider: Secondary | ICD-10-CM | POA: Insufficient documentation

## 2024-04-07 DIAGNOSIS — R0789 Other chest pain: Secondary | ICD-10-CM | POA: Diagnosis not present

## 2024-04-07 DIAGNOSIS — F1510 Other stimulant abuse, uncomplicated: Secondary | ICD-10-CM | POA: Insufficient documentation

## 2024-04-07 LAB — CBC
HCT: 42 % (ref 39.0–52.0)
Hemoglobin: 13.8 g/dL (ref 13.0–17.0)
MCH: 30.1 pg (ref 26.0–34.0)
MCHC: 32.9 g/dL (ref 30.0–36.0)
MCV: 91.5 fL (ref 80.0–100.0)
Platelets: 250 10*3/uL (ref 150–400)
RBC: 4.59 MIL/uL (ref 4.22–5.81)
RDW: 14.4 % (ref 11.5–15.5)
WBC: 7.6 10*3/uL (ref 4.0–10.5)
nRBC: 0 % (ref 0.0–0.2)

## 2024-04-07 LAB — BASIC METABOLIC PANEL WITH GFR
Anion gap: 9 (ref 5–15)
BUN: 17 mg/dL (ref 6–20)
CO2: 23 mmol/L (ref 22–32)
Calcium: 8.8 mg/dL — ABNORMAL LOW (ref 8.9–10.3)
Chloride: 108 mmol/L (ref 98–111)
Creatinine, Ser: 0.99 mg/dL (ref 0.61–1.24)
GFR, Estimated: >60 mL/min (ref >60)
Glucose, Bld: 112 mg/dL — ABNORMAL HIGH (ref 70–99)
Potassium: 3.6 mmol/L (ref 3.5–5.1)
Sodium: 140 mmol/L (ref 135–145)

## 2024-04-07 LAB — TROPONIN I (HIGH SENSITIVITY): Troponin I (High Sensitivity): 9 ng/L (ref <18)

## 2024-04-07 NOTE — ED Triage Notes (Signed)
 Patient arrived from [place] with requesting cigarette after using meth today. Patient reported some dizziness after using but alert and oriented nad.

## 2024-04-07 NOTE — ED Provider Triage Note (Signed)
 Emergency Medicine Provider Triage Evaluation Note  Cory Floyd , a 48 y.o. male  was evaluated in triage.  Pt complains of palpitations and chest pressure after smoking methamphetamine.  Review of Systems  Positive: Palpitations, chest pressure Negative: Weakness, numbness, tingling  Physical Exam  BP (!) 161/109 (BP Location: Right Arm)   Pulse (!) 118   Temp 98.2 F (36.8 C)   Resp 18   SpO2 99%  Gen:   Awake, no distress, speaking in full sentences Resp:  Normal effort, clear to auscultation bilaterally MSK:   Moves extremities without difficulty  Other:  No focal deficits noted  Medical Decision Making  Medically screening exam initiated at 10:36 AM.  Appropriate orders placed.  Cory Floyd was informed that the remainder of the evaluation will be completed by another provider, this initial triage assessment does not replace that evaluation, and the importance of remaining in the ED until their evaluation is complete.  48 year old male presenting to the emergency department today with palpitations and chest pressure after smoking methamphetamine.  The patient is tachycardic and mildly hypertensive here.  He is neurovascularly intact.  Does not appear to be in any acute distress.  EKG shows sinus tachycardia.  Labs are ordered to evaluate for ACS but initial EKG is not consistent with STEMI at this time.  Suspect tachycardia is from methamphetamine use.   Cory Charleston, MD 04/07/24 1037

## 2024-04-07 NOTE — ED Notes (Signed)
 Called multiple times for reassessment and repeat troponin

## 2024-04-25 ENCOUNTER — Other Ambulatory Visit: Payer: Self-pay

## 2024-04-25 ENCOUNTER — Emergency Department (HOSPITAL_COMMUNITY)
Admission: EM | Admit: 2024-04-25 | Discharge: 2024-04-25 | Discharge status: Against Medical Advice | Attending: Emergency Medicine | Admitting: Emergency Medicine

## 2024-04-25 ENCOUNTER — Encounter (HOSPITAL_COMMUNITY): Payer: Self-pay | Admitting: Emergency Medicine

## 2024-04-25 ENCOUNTER — Emergency Department (HOSPITAL_COMMUNITY)

## 2024-04-25 DIAGNOSIS — R0789 Other chest pain: Secondary | ICD-10-CM | POA: Diagnosis present

## 2024-04-25 DIAGNOSIS — Z5321 Procedure and treatment not carried out due to patient leaving prior to being seen by health care provider: Secondary | ICD-10-CM | POA: Insufficient documentation

## 2024-04-25 DIAGNOSIS — F419 Anxiety disorder, unspecified: Secondary | ICD-10-CM | POA: Diagnosis not present

## 2024-04-25 LAB — BASIC METABOLIC PANEL WITH GFR
Anion gap: 10 (ref 5–15)
BUN: 15 mg/dL (ref 6–20)
CO2: 22 mmol/L (ref 22–32)
Calcium: 9.1 mg/dL (ref 8.9–10.3)
Chloride: 107 mmol/L (ref 98–111)
Creatinine, Ser: 0.7 mg/dL (ref 0.61–1.24)
GFR, Estimated: >60 mL/min (ref >60)
Glucose, Bld: 100 mg/dL — ABNORMAL HIGH (ref 70–99)
Potassium: 3.8 mmol/L (ref 3.5–5.1)
Sodium: 139 mmol/L (ref 135–145)

## 2024-04-25 LAB — CBC
HCT: 40 % (ref 39.0–52.0)
Hemoglobin: 13.3 g/dL (ref 13.0–17.0)
MCH: 30.2 pg (ref 26.0–34.0)
MCHC: 33.3 g/dL (ref 30.0–36.0)
MCV: 90.9 fL (ref 80.0–100.0)
Platelets: 174 K/uL (ref 150–400)
RBC: 4.4 MIL/uL (ref 4.22–5.81)
RDW: 14.2 % (ref 11.5–15.5)
WBC: 7.3 K/uL (ref 4.0–10.5)
nRBC: 0 % (ref 0.0–0.2)

## 2024-04-25 LAB — TROPONIN I (HIGH SENSITIVITY): Troponin I (High Sensitivity): 9 ng/L (ref <18)

## 2024-04-25 NOTE — ED Triage Notes (Addendum)
 Pt via [facility] from a hotel c/o CP that started about 45 mins after doing cocaine. CP described as tightness x 45 mins PTA. Pt has significant BH history.   324mg  aspirin en route EKG unremarkable  BP 160/90 HR 116 O2 98% RA CBG 132  Pt states he would also like to talk to someone about his anxiety, noting that he was dropped by his ACT team due to medication noncompliance related to loss of provider.

## 2024-04-25 NOTE — ED Notes (Signed)
 Pt name called to go back to see provider. No response.

## 2024-04-27 ENCOUNTER — Encounter (HOSPITAL_COMMUNITY): Payer: Self-pay

## 2024-04-27 ENCOUNTER — Other Ambulatory Visit: Payer: Self-pay

## 2024-04-27 ENCOUNTER — Emergency Department (HOSPITAL_COMMUNITY)
Admission: EM | Admit: 2024-04-27 | Discharge: 2024-04-29 | Disposition: A | Discharge status: Other Acute Inpatient Hospital | Attending: Emergency Medicine | Admitting: Emergency Medicine

## 2024-04-27 DIAGNOSIS — E119 Type 2 diabetes mellitus without complications: Secondary | ICD-10-CM | POA: Insufficient documentation

## 2024-04-27 DIAGNOSIS — F319 Bipolar disorder, unspecified: Secondary | ICD-10-CM | POA: Diagnosis present

## 2024-04-27 DIAGNOSIS — Z91148 Patient's other noncompliance with medication regimen for other reason: Secondary | ICD-10-CM | POA: Insufficient documentation

## 2024-04-27 DIAGNOSIS — F1994 Other psychoactive substance use, unspecified with psychoactive substance-induced mood disorder: Secondary | ICD-10-CM | POA: Diagnosis present

## 2024-04-27 DIAGNOSIS — F22 Delusional disorders: Secondary | ICD-10-CM | POA: Diagnosis present

## 2024-04-27 DIAGNOSIS — Z79899 Other long term (current) drug therapy: Secondary | ICD-10-CM | POA: Diagnosis not present

## 2024-04-27 DIAGNOSIS — Z8616 Personal history of COVID-19: Secondary | ICD-10-CM | POA: Insufficient documentation

## 2024-04-27 DIAGNOSIS — Z5900 Homelessness unspecified: Secondary | ICD-10-CM | POA: Diagnosis not present

## 2024-04-27 DIAGNOSIS — I10 Essential (primary) hypertension: Secondary | ICD-10-CM | POA: Diagnosis not present

## 2024-04-27 LAB — CBC
HCT: 42.3 % (ref 39.0–52.0)
Hemoglobin: 13.5 g/dL (ref 13.0–17.0)
MCH: 30.5 pg (ref 26.0–34.0)
MCHC: 31.9 g/dL (ref 30.0–36.0)
MCV: 95.7 fL (ref 80.0–100.0)
Platelets: 184 K/uL (ref 150–400)
RBC: 4.42 MIL/uL (ref 4.22–5.81)
RDW: 14.4 % (ref 11.5–15.5)
WBC: 7.3 K/uL (ref 4.0–10.5)
nRBC: 0 % (ref 0.0–0.2)

## 2024-04-27 LAB — BASIC METABOLIC PANEL WITH GFR
Anion gap: 11 (ref 5–15)
BUN: 18 mg/dL (ref 6–20)
CO2: 22 mmol/L (ref 22–32)
Calcium: 9.1 mg/dL (ref 8.9–10.3)
Chloride: 106 mmol/L (ref 98–111)
Creatinine, Ser: 0.84 mg/dL (ref 0.61–1.24)
GFR, Estimated: >60 mL/min (ref >60)
Glucose, Bld: 96 mg/dL (ref 70–99)
Potassium: 4 mmol/L (ref 3.5–5.1)
Sodium: 139 mmol/L (ref 135–145)

## 2024-04-27 LAB — ETHANOL: Alcohol, Ethyl (B): <15 mg/dL (ref <15)

## 2024-04-27 MED ORDER — ARIPIPRAZOLE 10 MG PO TABS
10.0000 mg | ORAL_TABLET | Freq: Every day | ORAL | Status: DC
  Administered 2024-04-28: 10 mg via ORAL
  Filled 2024-04-27: qty 2

## 2024-04-27 MED ORDER — TRAZODONE HCL 50 MG PO TABS
150.0000 mg | ORAL_TABLET | Freq: Every evening | ORAL | Status: DC | PRN
  Administered 2024-04-28: 150 mg via ORAL
  Filled 2024-04-27: qty 1

## 2024-04-27 MED ORDER — MIRTAZAPINE 7.5 MG PO TABS
15.0000 mg | ORAL_TABLET | Freq: Every day | ORAL | Status: DC
  Administered 2024-04-27 – 2024-04-28 (×2): 15 mg via ORAL
  Filled 2024-04-27 (×2): qty 2

## 2024-04-27 NOTE — ED Provider Notes (Addendum)
 [place] EMERGENCY DEPARTMENT AT [facility] Provider Note   CSN: [id number] Arrival date & time: 04/27/24  2043     Patient presents with: Paranoid   Cory Floyd is a 48 y.o. male.   HPI Patient with paranoia.  States that people are after him and is pretty sure he is going to die.  States he was smoking crack last night but had been sober after that.  States he went to the grocery store and there were people there was 7 different vehicles that were still there when he was going to leave.  States he stayed there and then the cars all stay 2.  Police brought him in.  Does have a history of bipolar and schizophrenia.  Reviewing notes has had paranoia with use of substances.   Past Medical History:  Diagnosis Date   Alcoholic (HCC)    clean for 3 years   Anginal pain (HCC)    Arthritis    Bipolar 1 disorder (HCC)    COVID-19    Depressed    Diabetes mellitus without complication (HCC)    Dyspnea    Fatty liver    Hypertension    Schizophrenia (HCC)     Prior to Admission medications   Medication Sig Start Date End Date Taking? Authorizing Provider  ARIPiprazole  (ABILIFY ) 10 MG tablet Take 1 tablet (10 mg total) by mouth daily. 04/03/24   Raford Lenis, MD  ARIPiprazole  ER (ABILIFY  MAINTENA) 400 MG PRSY prefilled syringe Inject 400 mg into the muscle every 28 (twenty-eight) days. 11/13/23   [provider]  fluticasone  (FLONASE ) 50 MCG/ACT nasal spray Place 1 spray into both nostrils daily. 01/23/23   Clapacs, Norleen DASEN, MD  mirtazapine  (REMERON ) 15 MG tablet Take 1 tablet (15 mg total) by mouth at bedtime. 04/03/24   Raford Lenis, MD  traZODone  (DESYREL ) 150 MG tablet Take 1 tablet (150 mg total) by mouth at bedtime as needed for sleep. 04/03/24   Raford Lenis, MD    Allergies: Carrot [daucus carota], Penicillins, and Principen [ampicillin]    Review of Systems  Updated Vital Signs BP 124/81 (BP Location: Right Arm)   Pulse (!) 102   Temp 98.1 F (36.7  C) (Oral)   Resp 16   Ht 5' 10 (1.778 m)   Wt 89.8 kg   SpO2 100%   BMI 28.41 kg/m   Physical Exam Vitals and nursing note reviewed.  Cardiovascular:     Rate and Rhythm: Regular rhythm.  Pulmonary:     Breath sounds: No wheezing.  Abdominal:     Tenderness: There is no abdominal tenderness.  Skin:    General: Skin is warm.  Neurological:     Mental Status: He is alert and oriented to person, place, and time.  Psychiatric:     Comments: Patient somewhat paranoid.     (all labs ordered are listed, but only abnormal results are displayed) Labs Reviewed  BASIC METABOLIC PANEL WITH GFR  ETHANOL  CBC  RAPID URINE DRUG SCREEN, HOSP PERFORMED    EKG: None  Radiology: No results found.   Procedures   Medications Ordered in the ED  traZODone  (DESYREL ) tablet 150 mg (has no administration in time range)  mirtazapine  (REMERON ) tablet 15 mg (15 mg Oral Given 04/27/24 2247)  ARIPiprazole  (ABILIFY ) tablet 10 mg (has no administration in time range)  Medical Decision Making Amount and/or Complexity of Data Reviewed Labs: ordered.  Risk Prescription drug management.  Patient with paranoia.  Recent substance use last night also.  Denies suicidality.  However thinks people are out to get him.  Has had history of same.  Reviewed recent blood work.  Patient is medically cleared at this time.  Although new blood work pending.  Will have patient seen by TTS  Blood work back and reassuring.      Final diagnoses:  Paranoia [facility])    ED Discharge Orders     None          Patsey Lot, MD 04/27/24 2246    Patsey Lot, MD 04/27/24 [phone]

## 2024-04-27 NOTE — ED Notes (Signed)
 Patient's phone removed, placed in biohazard bag and labeled.

## 2024-04-27 NOTE — BH Assessment (Signed)
 Patient was deferred to [facility] for a telepsych assessment. The assigned care coordinator will provide updates regarding the scheduling of the assessment. [facility] care coordinator can be reached at [phone] for further information on the timing of the telepsych evaluation.

## 2024-04-27 NOTE — ED Triage Notes (Signed)
 Pt is convinced that someone is out to get him. He said that there were 12 vehicles at [employer] that was waiting to shoot him. He said that he has seen 2 people walk through that was at [employer].

## 2024-04-28 DIAGNOSIS — F1994 Other psychoactive substance use, unspecified with psychoactive substance-induced mood disorder: Secondary | ICD-10-CM

## 2024-04-28 DIAGNOSIS — F319 Bipolar disorder, unspecified: Secondary | ICD-10-CM

## 2024-04-28 LAB — RAPID URINE DRUG SCREEN, HOSP PERFORMED
Amphetamines: POSITIVE — AB
Barbiturates: NOT DETECTED
Benzodiazepines: NOT DETECTED
Cocaine: POSITIVE — AB
Opiates: NOT DETECTED
Tetrahydrocannabinol: NOT DETECTED

## 2024-04-28 NOTE — ED Notes (Signed)
 Pt was taken to the shower bc of smell

## 2024-04-28 NOTE — ED Provider Notes (Signed)
 NP attempted to assess face to face- patient is taking a shower. will follow-up when patient is out the shower.

## 2024-04-28 NOTE — Consult Note (Signed)
 [name] Psychiatric Consult Initial  Patient Name: .Cory Floyd  MRN: [id number]  DOB: 1975/11/19  Consult Order details:  Orders (From admission, onward)     Start     Ordered   04/27/24 2226  CONSULT TO CALL ACT TEAM       Ordering Provider: Patsey Lot, MD  Provider:  (Not yet assigned)  Question:  Reason for Consult?  Answer:  Psych consult   04/27/24 2226             Mode of Visit: In person    Psychiatry Consult Evaluation  Service Date: April 28, 2024 LOS:  LOS: 0 days  Chief Complaint people are after me  Primary Psychiatric Diagnoses  Substance induced mood disorder Bipolar disorder  Assessment  Cory Floyd is a 48 y.o. male admitted: Presented to the ED on 04/27/2024  8:47 PM for thoughts that someone is out to get him. He carries the psychiatric diagnoses of bipolar 1 disorder, polysubstance abuse, suicidal ideation, alcohol use disorder and substance induced mood disorder and has a past medical history of OSA on CPAP, GERD, arthritis, AKI and stable angina pectoris.   His current presentation of paranoia, irritability, lack of sleep is most consistent with decompensating mental health. He meets criteria for inpatient psychiatric admission based on current presentation.  Current outpatient psychotropic medications include abilify , depakote  and trazodone  and historically he has had a positive response to these medications. He was not compliant with medications prior to admission as evidenced by patient report. On initial examination, patient is irritable but cooperative. Please see plan below for detailed recommendations.     Diagnoses:  Active Hospital problems: Principal Problem:   Substance induced mood disorder (HCC) Active Problems:   Bipolar 1 disorder (HCC)    Plan   ## Psychiatric Medication Recommendations:  Continue patient home medications  ## Medical Decision Making Capacity: Not specifically addressed in this encounter   ##  Further Work-up:  -- most recent EKG on 04/25/2024 had QtC of 443 -- Pertinent labwork reviewed earlier this admission includes: CBC, CMP, alcohol, salicylate and acetaminophen ; UDS is pending   ## Disposition:-- We recommend inpatient psychiatric hospitalization. Patient is under voluntary admission status at this time; please IVC if attempts to leave hospital.   ## Behavioral / Environmental: -To minimize splitting of staff, assign one staff person to communicate all information from the team when feasible. or Utilize compassion and acknowledge the patient's experiences while setting clear and realistic expectations for care.    ## Safety and Observation Level:  - Based on my clinical evaluation, I estimate the patient to be at low risk of self harm in the current setting. - At this time, we recommend  routine. This decision is based on my review of the chart including patient's history and current presentation, interview of the patient, mental status examination, and consideration of suicide risk including evaluating suicidal ideation, plan, intent, suicidal or self-harm behaviors, risk factors, and protective factors. This judgment is based on our ability to directly address suicide risk, implement suicide prevention strategies, and develop a safety plan while the patient is in the clinical setting. Please contact our team if there is a concern that risk level has changed.  CSSR Risk Category:C-SSRS RISK CATEGORY: No Risk  Suicide Risk Assessment: Patient has following modifiable risk factors for suicide: medication noncompliance, active mental illness (to encompass adhd, tbi, mania, psychosis, trauma reaction), and current symptoms: anxiety/panic, insomnia, impulsivity, anhedonia, hopelessness, which we are addressing by  recommending inpatient psychiatric admission. Patient has following non-modifiable or demographic risk factors for suicide: male gender and psychiatric  hospitalization Patient has the following protective factors against suicide: Supportive friends  Thank you for this consult request. Recommendations have been communicated to the primary team.  We will continue to follow patient at this time.   CATHALEEN ADAM, PMHNP       History of Present Illness  Relevant Aspects of Hospital ED Course:  Admitted on 04/27/2024 for paranoid thoughts.  Patient Report:  TYREESE Floyd, 48 y.o., male patient seen face to face by this provider, consulted with Dr. Merilee; and chart reviewed on 04/28/24. Cory Floyd reports that people are after him and is pretty sure he is going to die. States he was smoking crack last night but had been sober after that. States he went to the grocery store and there were people there, and 7 different vehicles that were still there when he was going to leave, he says he didn't leave and stayed to see what the cars were going to do, and he says it was like they were waiting on him.  He continues to state that people want to harm him and see him dead.  He states that he currently is in between homes, and that he does not have a residence.  Patient states that he does have a history of bipolar, and schizophrenia.  He also endorses crack and ice but states that he has not used any illicit substances in 2 days, due to people being after him.  He denies using any alcohol.  UDS is needed BAL is less than 15.  Patient also states that he is supposed to be taking Abilify , Remeron , and trazodone , but states he has not been compliant with his medications and a long while.He currently denies SI/HI.   During evaluation ANDREUS Floyd is laying in bed, and appears to be in moderate distress. He is alert, oriented x 4, calm, cooperative and attentive.His mood is irritable  with congruent affect. Patient has a known history of cocaine use disorder, presenting with acute paranoid delusions, poor insight, and suspiciousness of staff. He  admits to recent cocaine use "a few days ago" but is vague about how often and how much. Patient has poor eye contact. He believes people are watching him and expresses fear of being harmed. He denies suicidal or homicidal ideation but appears significantly guarded and hypervigilant. No known medical illness interfering with current presentation. Patient is not psychiatrically stable for outpatient care or safe discharge due to the intensity of paranoid ideation.   Psych ROS:  Depression: Endorses Anxiety:  Endorses Mania (lifetime and current): Denies  Psychosis: (lifetime and current): Yes   Collateral information:  Attempted to contact patient's sister Lyle Blush at [phone], no answer, will re attempt.   Review of Systems  Psychiatric/Behavioral:  Positive for depression and hallucinations.      Psychiatric and Social History  Psychiatric History:  Information collected from patient and chart review  Prev Dx/Sx: Bipolar disorder, schizoaffective Current Psych Provider: None Home Meds (current): Abilify , Remeron , trazodone  Previous Med Trials: Yes Therapy: Denies  Prior Psych Hospitalization: Yes Prior Self Harm: Denies Prior Violence: Denies  Family Psych History: Denies Family Hx suicide: Denies  Social History:  Developmental Hx: Within normal limits Educational Hx: Graduated high school Occupational Hx: Banker at events  Legal Hx:  Is a registered sex offender  Living Situation: homeless  Spiritual Hx: Unknown Access  to weapons/lethal means: Denies  Substance History Alcohol: endorses  Tobacco: endorses Illicit drugs: endorses Prescription drug abuse: denies Rehab hx: denies  Exam Findings  Physical Exam:  Vital Signs:  Temp:  [97.9 F (36.6 C)-98.1 F (36.7 C)] 97.9 F (36.6 C) (07/07 0646) Pulse Rate:  [79-102] 79 (07/07 0646) Resp:  [16-18] 18 (07/07 0646) BP: (117-124)/(81-88) 117/88 (07/07 0646) SpO2:  [99 %-100 %] 99  % (07/07 0646) Weight:  [89.8 kg] 89.8 kg (07/06 2109) Blood pressure 117/88, pulse 79, temperature 97.9 F (36.6 C), temperature source Oral, resp. rate 18, height 5' 10 (1.778 m), weight 89.8 kg, SpO2 99%. Body mass index is 28.41 kg/m.  Physical Exam Vitals and nursing note reviewed. Exam conducted with a chaperone present.  Neurological:     Mental Status: He is alert.  Psychiatric:        Attention and Perception: Attention normal.        Mood and Affect: Mood is anxious. Affect is labile.        Speech: Speech normal.        Behavior: Behavior is agitated.        Thought Content: Thought content is paranoid and delusional.        Cognition and Memory: Cognition is impaired.        Judgment: Judgment is impulsive.     Mental Status Exam: General Appearance: Casual  Orientation:  Full (Time, Place, and Person)  Memory:  Immediate;   Fair Remote;   Fair  Concentration:  Concentration: Fair and Attention Span: Fair  Recall:  Fair  Attention  Poor  Eye Contact:  Poor  Speech:  Clear and Coherent  Language:  Fair  Volume:  Normal  Mood: irritable   Affect:  Congruent  Thought Process:  Coherent  Thought Content:  Delusions and Paranoid Ideation  Suicidal Thoughts:  No  Homicidal Thoughts:  No  Judgement:  Poor  Insight:  Lacking  Psychomotor Activity:  Normal  Akathisia:  NA  Fund of Knowledge:  Fair      Assets:  Manufacturing systems engineer Desire for Improvement Social Support  Cognition:  Impaired,  Mild  ADL's:  Impaired  AIMS (if indicated):        Other History   These have been pulled in through the EMR, reviewed, and updated if appropriate.  Family History:  The patient's family history includes Cancer in his maternal aunt and mother; Hepatitis C in his mother; Hypertension in his maternal grandmother.  Medical History: Past Medical History:  Diagnosis Date  . 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 Anginal pain (HCC)   . Arthritis   . Bipolar 1  disorder (HCC)   . COVID-19   . Depressed   . Diabetes mellitus without complication (HCC)   . Dyspnea   . Fatty liver   . Hypertension   . Schizophrenia [facility])     Surgical History: Past Surgical History:  Procedure Laterality Date  . COLONOSCOPY  02/05/2014   diverticulosis, hyperplastic polyp x 2  . fracture arm Right   . INCISION AND DRAINAGE ABSCESS Bilateral 12/29/2022   Procedure: INCISION AND DRAINAGE ABSCESS BILATERAL ARMS;  Surgeon: Kendal Franky SQUIBB, MD;  Location: [facility] OR;  Service: Orthopedics;  Laterality: Bilateral;  . KNEE SURGERY Right 2013  . NASAL SEPTUM SURGERY    . NASAL TURBINATE REDUCTION Bilateral 12/12/2016   Procedure: TURBINATE REDUCTION/SUBMUCOSAL RESECTION;  Surgeon: Chinita Hasten, MD;  Location: [facility] ORS;  Service: ENT;  Laterality: Bilateral;     Medications:   Current Facility-Administered Medications:  .  ARIPiprazole  (ABILIFY ) tablet 10 mg, 10 mg, Oral, Daily, Patsey Lot, MD, 10 mg at 04/28/24 1007 .  mirtazapine  (REMERON ) tablet 15 mg, 15 mg, Oral, QHS, Patsey Lot, MD, 15 mg at 04/27/24 2247 .  traZODone  (DESYREL ) tablet 150 mg, 150 mg, Oral, QHS PRN, Patsey Lot, MD  Current Outpatient Medications:  .  ARIPiprazole  (ABILIFY ) 10 MG tablet, Take 1 tablet (10 mg total) by mouth daily. (Patient not taking: Reported on 04/28/2024), Disp: 30 tablet, Rfl: 0 .  ARIPiprazole  ER (ABILIFY  MAINTENA) 400 MG PRSY prefilled syringe, Inject 400 mg into the muscle every 28 (twenty-eight) days. (Patient not taking: Reported on 04/28/2024), Disp: , Rfl:  .  fluticasone  (FLONASE ) 50 MCG/ACT nasal spray, Place 1 spray into both nostrils daily. (Patient not taking: Reported on 04/28/2024), Disp: 16 g, Rfl: 0 .  mirtazapine  (REMERON ) 15 MG tablet, Take 1 tablet (15 mg total) by mouth at bedtime. (Patient not taking: Reported on 04/28/2024), Disp: 30 tablet, Rfl: 0 .  traZODone  (DESYREL ) 150 MG tablet, Take 1 tablet (150 mg total) by mouth at bedtime as needed  for sleep. (Patient not taking: Reported on 04/28/2024), Disp: 30 tablet, Rfl: 0  Allergies: Allergies  Allergen Reactions  . Carrot [Daucus Carota] Anaphylaxis and Rash  . Penicillins Anaphylaxis and Hives  . Principen [Ampicillin] Anaphylaxis and Hives    Jaloni Sorber MOTLEY-MANGRUM, PMHNP

## 2024-04-28 NOTE — Progress Notes (Signed)
 Inpatient Psychiatric Referral  Patient was recommended inpatient per Jadeka Motley-Mangrum, NP . There are no available beds at [facility], per [facility] Geni Silvan, RN. Patient was re-faxed to the following out of network facilities:  Destination  Service Provider Request Status Address Phone Fax  [facility]  Pending - Request Sent [address], [place] [address] [phone]  [facility]  Pending - Request Sent [address] [place] [address] [phone]  [facility]  Pending - Request Sent [address] [place] [address] [phone]  [facility]  Pending - Request Sent [address], [place] [address] [phone]  [facility]  Pending - Request Sent [address] [place] [address] [phone]  [facility]  Pending - Request Sent [address] [place] [phone]  [facility]  Pending - Request Sent [address] [place] [address] [phone]  [facility]  Pending - Request [facility] [address], [place] [address] [phone]  [facility]  Pending - Request Sent [address] [place] [address] [phone]  [facility]  Pending - Request Sent [address] [place], [place] [address] [phone]  [facility]  Pending - Request Sent [address], [place] [address] [phone]     Situation ongoing, CSW to continue following and update chart as more information becomes available.   Harrie Sofia MSW, LCSWA 04/28/2024  8:41PM

## 2024-04-28 NOTE — Progress Notes (Signed)
 LCSW Progress Note  [id number]   SAUNDERS ARLINGTON  04/28/2024  1:08 PM  Description:   Inpatient Psychiatric Referral  Patient was recommended inpatient per [facility] NP. There are no available beds at [facility], per [facility] Cherylynn Ernst RN. Patient was referred to the following out of network facilities:   Destination  Service Provider Address Phone Fax  [facility]  [address], [place] [address] [phone]  [facility]  [address] [place], [place] [address] [phone]  [facility]  [address] [place] [address] [phone]  [facility]  [address], [place] [address] [phone]  [facility]  [address] [place] [phone]  [facility]  [address] [place] [address] [phone]  [facility]  [address], [place] [address] [phone]      Situation ongoing, CSW to continue following and update chart as more information becomes available.      [place] Felishia Wartman, MSW, LCSW  04/28/2024 1:08 PM

## 2024-04-28 NOTE — BH Assessment (Signed)
 IRIS deferred back to in house provider.

## 2024-04-28 NOTE — ED Notes (Signed)
 Pt got up went across the hall and urinated in the corner of room. Said he thought it was the bathroom.

## 2024-04-28 NOTE — ED Provider Notes (Signed)
 Emergency Medicine Observation Re-evaluation Note  Cory Floyd is a 48 y.o. male, seen on rounds today.  Pt initially presented to the ED for complaints of Paranoid Currently, the patient is not having any acute complaints.  Physical Exam  BP 117/88   Pulse 79   Temp 97.9 F (36.6 C) (Oral)   Resp 18   Ht 5' 10 (1.778 m)   Wt 89.8 kg   SpO2 99%   BMI 28.41 kg/m  Physical Exam General: Resting comfortably in stretcher Lungs: Normal work of breathing Psych: Calm  ED Course / MDM  EKG:   I have reviewed the labs performed to date as well as medications administered while in observation.  Recent changes in the last 24 hours include medically cleared and still awaiting TTS consult.  Plan  Current plan is for TTS consult.    Yolande Lamar BROCKS, MD 04/28/24 [phone]

## 2024-04-28 NOTE — Progress Notes (Signed)
 Pt has been accepted to [facility] 04/28/2024 . Bed assignment:[place] A   Pt meets inpatient criteria per Cathaleen Adam, NP   Attending Physician will be Dr. Arnold   Report can be called to: - [phone]   Pt can arrive after 8AM   Care Team Notified: Pietro Motto, RN

## 2024-04-29 NOTE — ED Provider Notes (Signed)
 Emergency Medicine Observation Re-evaluation Note  Cory Floyd is a 48 y.o. male, seen on rounds today.  Pt initially presented to the ED for complaints of Paranoid Currently, the patient is resting comfortably. He was seen by psychiatry and they recommended inpatient psychiatry  Physical Exam  BP 118/74 (BP Location: Right Arm)   Pulse 79   Temp 98.2 F (36.8 C) (Oral)   Resp 18   Ht 5' 10 (1.778 m)   Wt 89.8 kg   SpO2 99%   BMI 28.41 kg/m  Physical Exam General: NAD Lungs: no respiratory distress Psych: calm  ED Course / MDM  EKG:   I have reviewed the labs performed to date as well as medications administered while in observation.  Recent changes in the last 24 hours include patient will be discharged today to Cory Floyd for inpatient psychiatry stay per psychiatry recommendations  Plan  Current plan is for inpatient psychiatry today.     Cory Duwaine CROME, DO 04/29/24 [phone]

## 2024-04-29 NOTE — ED Notes (Signed)
 Safe transport called advised <30 minutes for transport

## 2024-04-29 NOTE — ED Notes (Signed)
 Attempted to call [facility] again at this time. Still no answer.

## 2024-04-29 NOTE — ED Notes (Signed)
 Called x1 to [facility]. No one answered. Will try again in 15 mintues

## 2024-04-29 NOTE — Discharge Instructions (Addendum)
 Patient to be discharged to [facility] for inpatient psychiatry.

## 2024-04-29 NOTE — ED Notes (Signed)
 Spoke with Vivian, RN and gave report.

## 2024-07-17 ENCOUNTER — Emergency Department (HOSPITAL_COMMUNITY)
Admission: EM | Admit: 2024-07-17 | Discharge: 2024-07-17 | Disposition: A | Discharge status: Home / Self Care | Attending: Emergency Medicine | Admitting: Emergency Medicine

## 2024-07-17 ENCOUNTER — Emergency Department (HOSPITAL_COMMUNITY)

## 2024-07-17 ENCOUNTER — Other Ambulatory Visit: Payer: Self-pay

## 2024-07-17 DIAGNOSIS — M25521 Pain in right elbow: Secondary | ICD-10-CM | POA: Diagnosis present

## 2024-07-17 NOTE — Progress Notes (Signed)
 Orthopedic Tech Progress Note Patient Details:  ELDON ZIETLOW [birth year] [id number]  Ortho Devices Type of Ortho Device: Ace wrap, Sling immobilizer Ortho Device/Splint Location: R ELBOW Ortho Device/Splint Interventions: Ordered, Application   Post Interventions Patient Tolerated: Well Instructions Provided: Care of device   Keylan Costabile L Taison Celani 07/17/2024, 5:41 AM
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
## 2024-08-19 ENCOUNTER — Other Ambulatory Visit: Payer: Self-pay

## 2024-08-19 ENCOUNTER — Encounter (HOSPITAL_COMMUNITY): Payer: Self-pay | Admitting: Emergency Medicine

## 2024-08-19 ENCOUNTER — Emergency Department (HOSPITAL_COMMUNITY)

## 2024-08-19 ENCOUNTER — Emergency Department (HOSPITAL_COMMUNITY)
Admission: EM | Admit: 2024-08-19 | Discharge: 2024-08-19 | Disposition: A | Discharge status: Home / Self Care | Attending: Emergency Medicine | Admitting: Emergency Medicine

## 2024-08-19 DIAGNOSIS — E119 Type 2 diabetes mellitus without complications: Secondary | ICD-10-CM | POA: Insufficient documentation

## 2024-08-19 DIAGNOSIS — R10A3 Flank pain, bilateral: Secondary | ICD-10-CM

## 2024-08-19 DIAGNOSIS — M545 Low back pain, unspecified: Secondary | ICD-10-CM | POA: Diagnosis not present

## 2024-08-19 DIAGNOSIS — K760 Fatty (change of) liver, not elsewhere classified: Secondary | ICD-10-CM | POA: Diagnosis not present

## 2024-08-19 DIAGNOSIS — F319 Bipolar disorder, unspecified: Secondary | ICD-10-CM | POA: Diagnosis not present

## 2024-08-19 DIAGNOSIS — R10A2 Flank pain, left side: Secondary | ICD-10-CM | POA: Diagnosis not present

## 2024-08-19 DIAGNOSIS — R10A1 Flank pain, right side: Secondary | ICD-10-CM | POA: Insufficient documentation

## 2024-08-19 DIAGNOSIS — N320 Bladder-neck obstruction: Secondary | ICD-10-CM | POA: Diagnosis present

## 2024-08-19 LAB — COMPREHENSIVE METABOLIC PANEL WITH GFR
ALT: 25 U/L (ref 0–44)
AST: 27 U/L (ref 15–41)
Albumin: 3.9 g/dL (ref 3.5–5.0)
Alkaline Phosphatase: 67 U/L (ref 38–126)
Anion gap: 12 (ref 5–15)
BUN: 16 mg/dL (ref 6–20)
CO2: 25 mmol/L (ref 22–32)
Calcium: 9 mg/dL (ref 8.9–10.3)
Chloride: 101 mmol/L (ref 98–111)
Creatinine, Ser: 0.82 mg/dL (ref 0.61–1.24)
GFR, Estimated: >60 mL/min (ref >60)
Glucose, Bld: 87 mg/dL (ref 70–99)
Potassium: 3.4 mmol/L — ABNORMAL LOW (ref 3.5–5.1)
Sodium: 138 mmol/L (ref 135–145)
Total Bilirubin: 0.6 mg/dL (ref 0.0–1.2)
Total Protein: 7.7 g/dL (ref 6.5–8.1)

## 2024-08-19 LAB — URINALYSIS, ROUTINE W REFLEX MICROSCOPIC
Bilirubin Urine: NEGATIVE
Glucose, UA: NEGATIVE mg/dL
Hgb urine dipstick: NEGATIVE
Ketones, ur: NEGATIVE mg/dL
Leukocytes,Ua: NEGATIVE
Nitrite: NEGATIVE
Protein, ur: NEGATIVE mg/dL
Specific Gravity, Urine: 1.02 (ref 1.005–1.030)
pH: 7 (ref 5.0–8.0)

## 2024-08-19 LAB — LIPASE, BLOOD: Lipase: 24 U/L (ref 11–51)

## 2024-08-19 LAB — CBC
HCT: 39.1 % (ref 39.0–52.0)
Hemoglobin: 12.8 g/dL — ABNORMAL LOW (ref 13.0–17.0)
MCH: 30.2 pg (ref 26.0–34.0)
MCHC: 32.7 g/dL (ref 30.0–36.0)
MCV: 92.2 fL (ref 80.0–100.0)
Platelets: 235 K/uL (ref 150–400)
RBC: 4.24 MIL/uL (ref 4.22–5.81)
RDW: 13.2 % (ref 11.5–15.5)
WBC: 5.7 K/uL (ref 4.0–10.5)
nRBC: 0 % (ref 0.0–0.2)

## 2024-08-19 MED ORDER — KETOROLAC TROMETHAMINE 15 MG/ML IJ SOLN
15.0000 mg | Freq: Once | INTRAMUSCULAR | Status: AC
  Administered 2024-08-19: 15 mg via INTRAMUSCULAR
  Filled 2024-08-19: qty 1

## 2024-08-19 MED ORDER — LIDOCAINE 5 % EX OINT: 1.0000 | TOPICAL_OINTMENT | CUTANEOUS | 0 refills | Status: DC | PRN

## 2024-08-19 MED ORDER — POTASSIUM CHLORIDE CRYS ER 20 MEQ PO TBCR
40.0000 meq | EXTENDED_RELEASE_TABLET | Freq: Once | ORAL | Status: AC
Start: 2024-08-19 — End: 2024-08-19
  Administered 2024-08-19: 40 meq via ORAL
  Filled 2024-08-19: qty 2

## 2024-08-19 NOTE — ED Provider Triage Note (Signed)
 Emergency Medicine Provider Triage Evaluation Note  Cory Floyd , a 48 y.o. male  was evaluated in triage.  Pt complains of R flank and now L flank pain progressive over 2 weeks.   Moments of nausea but no vomiting. No abd pain, CP, SOB.   Urine has been dark but no blood noticed. No penile DC or testicle pain.   Review of Systems  Positive: Flank pain Negative: Fever   Physical Exam  BP (!) 142/101 (BP Location: Right Arm)   Pulse 99   Temp 97.8 F (36.6 C)   Resp 18   SpO2 100%  Gen:   Awake, no distress   Resp:  Normal effort  MSK:   Moves extremities without difficulty  Other:  Muscular low back TTP no midline pain no CVAT  Medical Decision Making  Medically screening exam initiated at 4:42 PM.  Appropriate orders placed.  Cory Floyd was informed that the remainder of the evaluation will be completed by another provider, this initial triage assessment does not replace that evaluation, and the importance of remaining in the ED until their evaluation is complete.  Labs, CT renal   Cory Floyd, Cory Floyd 08/19/24 1645

## 2024-08-19 NOTE — ED Notes (Signed)
Pt verbalized understanding of discharge instructions. Pt ambulated from ed with steady gait.  

## 2024-08-19 NOTE — Discharge Instructions (Addendum)
 FOLLOW UP WITH A PRIMARY CARE PROVIDER PLEASE

## 2024-08-19 NOTE — ED Triage Notes (Addendum)
 Pt here for BL flank pain and low back for about 2 weeks.  Also endorses trouble with urination for a while.  Possibly not fully emptying when voiding.  No burning with urination. Also having trouble with BM's. Last BM 2 days ago. NO NV. Pt has been dx with kidney stones in the past.

## 2024-08-19 NOTE — ED Provider Notes (Signed)
 [place] [facility] DE[facility] AT [facility] Provider Note   CSN: [id number] Arrival date & time: 08/19/24  1514     Patient presents with: Flank Pain   Cory Floyd is a 49 y.o. male.    Flank Pain  Patient is a 48 year old male with a past medical history of standing up for polysubstance abuse, bipolar, fatty liver, DM, alcoholism  Pt complains of R flank and now L flank pain progressive over 2 weeks.    Moments of nausea but no vomiting. No abd pain, CP, SOB.    Urine has been dark but no blood noticed. No penile DC or testicle pain.        Prior to Admission medications   Medication Sig Start Date End Date Taking? Authorizing Provider  lidocaine  (XYLOCAINE ) 5 % ointment Apply 1 Application topically as needed. 08/19/24  Yes Neldon Inoue S, PA    Allergies: Carrot [daucus carota], Penicillins, and Principen [ampicillin]    Review of Systems  Genitourinary:  Positive for flank pain.    Updated Vital Signs BP (!) 127/101   Pulse 82   Temp 98.1 F (36.7 C) (Oral)   Resp 18   SpO2 99%   Physical Exam Vitals and nursing note reviewed.  Constitutional:      General: He is not in acute distress. HENT:     Head: Normocephalic and atraumatic.     Nose: Nose normal.     Mouth/Throat:     Mouth: Mucous membranes are moist.  Eyes:     General: No scleral icterus. Cardiovascular:     Rate and Rhythm: Normal rate and regular rhythm.     Pulses: Normal pulses.     Heart sounds: Normal heart sounds.  Pulmonary:     Effort: Pulmonary effort is normal. No respiratory distress.     Breath sounds: No wheezing.  Abdominal:     Palpations: Abdomen is soft.     Tenderness: There is no abdominal tenderness. There is no right CVA tenderness or left CVA tenderness.     Comments: Perry lumbar muscular tenderness bilaterally no midline C, T, L-spine tenderness.  No CVA tenderness, abdominal is soft nontender  Musculoskeletal:     Cervical back: Normal  range of motion.     Right lower leg: No edema.     Left lower leg: No edema.  Skin:    General: Skin is warm and dry.     Capillary Refill: Capillary refill takes less than 2 seconds.  Neurological:     Mental Status: He is alert. Mental status is at baseline.  Psychiatric:        Mood and Affect: Mood normal.        Behavior: Behavior normal.     (all labs ordered are listed, but only abnormal results are displayed) Labs Reviewed  COMPREHENSIVE METABOLIC PANEL WITH GFR - Abnormal; Notable for the following components:      Result Value   Potassium 3.4 (*)    All other components within normal limits  CBC - Abnormal; Notable for the following components:   Hemoglobin 12.8 (*)    All other components within normal limits  LIPASE, BLOOD  URINALYSIS, ROUTINE W REFLEX MICROSCOPIC    EKG: None  Radiology: CT Renal Stone Study Result Date: 08/19/2024 EXAM: CT UROGRAM 08/19/2024 05:04:00 PM TECHNIQUE: CT of the abdomen and pelvis was performed without the administration of intravenous contrast as per CT urogram protocol. Multiplanar reformatted images as well as MIP  urogram images are provided for review. Automated exposure control, iterative reconstruction, and/or weight based adjustment of the mA/kV was utilized to reduce the radiation dose to as low as reasonably achievable. COMPARISON: None available. CLINICAL HISTORY: Bladder-neck obstruction; difficulty urinating. Flank Pain; CT Renal Stone Study; Bladder-neck obstruction, difficulty urinating; See ED Notes:; Pt here for BL flank pain and low back for about 2 weeks. Also endorses trouble with urination for a while. Possibly not fully emptying when voiding. No burning with urination. Also having trouble with BM's. Last BM 2 days ago. NO NV. Pt has been dx with kidney stones in the past. FINDINGS: LOWER CHEST: No acute abnormality. LIVER: The liver is unremarkable. GALLBLADDER AND BILE DUCTS: Gallbladder is unremarkable. No biliary  ductal dilatation. SPLEEN: No acute abnormality. PANCREAS: No acute abnormality. ADRENAL GLANDS: No acute abnormality. KIDNEYS, URETERS AND BLADDER: No stones in the kidneys or ureters. No hydronephrosis. There is a cyst in the inferior pole of the left kidney measuring 18 mm, which is unchanged from prior. No perinephric or periureteral stranding. Urinary bladder is unremarkable. GI AND BOWEL: Stomach demonstrates no acute abnormality. There is no bowel obstruction. PERITONEUM AND RETROPERITONEUM: No ascites. No free air. VASCULATURE: Aorta is normal in caliber. LYMPH NODES: No lymphadenopathy. REPRODUCTIVE ORGANS: No acute abnormality. BONES AND SOFT TISSUES: No acute osseous abnormality. No focal soft tissue abnormality. IMPRESSION: 1. No nephrolithiasis, obstructive uropathy or urothelial mass. 2. No acute intra-abdominal or pelvic abnormality. Electronically signed by: Greig Pique MD 08/19/2024 05:29 PM EDT RP Workstation: [id number]     .Ultrasound ED Peripheral IV (Provider)  Date/Time: 08/19/2024 8:48 PM  Performed by: [name], PA Authorized by: [name], PA   Procedure details:    Skin Prep: chlorhexidine  gluconate     Location:  Right AC   Angiocath:  20 G   Bedside Ultrasound Guided: Yes     Images: not archived     Patient tolerated procedure without complications: Yes     Dressing applied: Yes      Medications Ordered in the ED  potassium chloride  SA (KLOR-CON  M) CR tablet 40 mEq (40 mEq Oral Given 08/19/24 1933)  ketorolac (TORADOL) 15 MG/ML injection 15 mg (15 mg Intramuscular Given 08/19/24 1934)                                    Medical Decision Making Amount and/or Complexity of Data Reviewed Labs: ordered. Radiology: ordered.  Risk Prescription drug management.    This patient presents to the ED for concern of flank pain, this involves a number of treatment options, and is a complaint that carries with it a moderate risk of complications and  morbidity. A differential diagnosis was considered for the patient's symptoms which is discussed below:   The differential diagnosis of emergent flank pain includes, but is not limited to :Abdominal aortic aneurysm,, Renal artery embolism,Renal vein thrombosis, Aortic dissection, Mesenteric ischemia, Pyelonephritis, Renal infarction, Renal hemorrhage, Nephrolithiasis/ Renal Colic, Bladder tumor,Cystitis, Biliary colic, Pancreatitis Perforated peptic ulcer Appendicitis ,Inguinal Hernia, Diverticulitis, Bowel obstruction Shingles Lower lobe pneumonia, Retroperitoneal hematoma/abscess/tumor, Epidural abscess, Epidural hematoma  In this particular case have a very high suspicion for this being muscular pain.   Co morbidities: Discussed in HPI   Brief History:  Patient is a 48 year old male with a past medical history of standing up for polysubstance abuse, bipolar, fatty liver, DM, alcoholism  Pt complains of R flank  and now L flank pain progressive over 2 weeks.    Moments of nausea but no vomiting. No abd pain, CP, SOB.    Urine has been dark but no blood noticed. No penile DC or testicle pain.     EMR reviewed including pt PMHx, past surgical history and past visits to ER.   See HPI for more details   Lab Tests:   I personally reviewed all laboratory work and imaging. Metabolic panel without any acute abnormality specifically kidney function within normal limits and no significant electrolyte abnormalities. CBC without leukocytosis or significant anemia.   Imaging Studies:  NAD. I personally reviewed all imaging studies and no acute abnormality found. I agree with radiology interpretation.  IMPRESSION:  1. No nephrolithiasis, obstructive uropathy or urothelial mass.  2. No acute intra-abdominal or pelvic abnormality.    Cardiac Monitoring:  NA NA   Medicines ordered:  I ordered medication including Toradol, potassium for hypokalemia and pain Reevaluation of the  patient after these medicines showed that the patient resolved I have reviewed the patients home medicines and have made adjustments as needed   Critical Interventions:     Consults/Attending Physician      Reevaluation:  After the interventions noted above I re-evaluated patient and found that they have : Resolved   Social Determinants of Health:      Problem List / ED Course:  This patient has bilateral flank pain improved with Toradol.  No urinary symptoms other than some feelings of need to strain to urinate.  I recommended he follow-up with urology.  Will give Lidoderm  patches for likely musculoskeletal pain/muscular pain.  Return precautions emergency room provided.   Dispostion:  After consideration of the diagnostic results and the patients response to treatment, I feel that the patent would benefit from outpatient follow-up with urology.  Final diagnoses:  Bilateral flank pain    ED Discharge Orders          Ordered    lidocaine  (XYLOCAINE ) 5 % ointment  As needed        08/19/24 2001               [name], [place] 08/19/24 2152    Patsey Lot, MD 08/19/24 2250

## 2024-08-21 ENCOUNTER — Other Ambulatory Visit: Payer: Self-pay

## 2024-08-21 ENCOUNTER — Telehealth (HOSPITAL_COMMUNITY): Payer: Self-pay | Admitting: Student in an Organized Health Care Education/Training Program

## 2024-08-21 ENCOUNTER — Encounter (HOSPITAL_COMMUNITY): Payer: Self-pay | Admitting: Emergency Medicine

## 2024-08-21 ENCOUNTER — Emergency Department (HOSPITAL_COMMUNITY)
Admission: EM | Admit: 2024-08-21 | Discharge: 2024-08-21 | Disposition: A | Discharge status: Home / Self Care | Attending: Emergency Medicine | Admitting: Emergency Medicine

## 2024-08-21 DIAGNOSIS — Z91138 Patient's unintentional underdosing of medication regimen for other reason: Secondary | ICD-10-CM | POA: Diagnosis not present

## 2024-08-21 DIAGNOSIS — T43506A Underdosing of unspecified antipsychotics and neuroleptics, initial encounter: Secondary | ICD-10-CM | POA: Insufficient documentation

## 2024-08-21 DIAGNOSIS — Z59819 Housing instability, housed unspecified: Secondary | ICD-10-CM | POA: Insufficient documentation

## 2024-08-21 DIAGNOSIS — E119 Type 2 diabetes mellitus without complications: Secondary | ICD-10-CM | POA: Insufficient documentation

## 2024-08-21 DIAGNOSIS — Z79899 Other long term (current) drug therapy: Secondary | ICD-10-CM | POA: Insufficient documentation

## 2024-08-21 DIAGNOSIS — F22 Delusional disorders: Secondary | ICD-10-CM | POA: Diagnosis present

## 2024-08-21 DIAGNOSIS — I10 Essential (primary) hypertension: Secondary | ICD-10-CM | POA: Diagnosis not present

## 2024-08-21 LAB — COMPREHENSIVE METABOLIC PANEL WITH GFR
ALT: 24 U/L (ref 0–44)
AST: 27 U/L (ref 15–41)
Albumin: 4 g/dL (ref 3.5–5.0)
Alkaline Phosphatase: 74 U/L (ref 38–126)
Anion gap: 13 (ref 5–15)
BUN: 14 mg/dL (ref 6–20)
CO2: 22 mmol/L (ref 22–32)
Calcium: 9.2 mg/dL (ref 8.9–10.3)
Chloride: 105 mmol/L (ref 98–111)
Creatinine, Ser: 0.98 mg/dL (ref 0.61–1.24)
GFR, Estimated: >60 mL/min (ref >60)
Glucose, Bld: 82 mg/dL (ref 70–99)
Potassium: 4.2 mmol/L (ref 3.5–5.1)
Sodium: 140 mmol/L (ref 135–145)
Total Bilirubin: 0.2 mg/dL (ref 0.0–1.2)
Total Protein: 7.7 g/dL (ref 6.5–8.1)

## 2024-08-21 LAB — CBC
HCT: 42 % (ref 39.0–52.0)
Hemoglobin: 13.6 g/dL (ref 13.0–17.0)
MCH: 30.6 pg (ref 26.0–34.0)
MCHC: 32.4 g/dL (ref 30.0–36.0)
MCV: 94.6 fL (ref 80.0–100.0)
Platelets: 264 K/uL (ref 150–400)
RBC: 4.44 MIL/uL (ref 4.22–5.81)
RDW: 13.2 % (ref 11.5–15.5)
WBC: 7.2 K/uL (ref 4.0–10.5)
nRBC: 0 % (ref 0.0–0.2)

## 2024-08-21 LAB — RAPID URINE DRUG SCREEN, HOSP PERFORMED
Amphetamines: POSITIVE — AB
Barbiturates: NOT DETECTED
Benzodiazepines: NOT DETECTED
Cocaine: POSITIVE — AB
Opiates: NOT DETECTED
Tetrahydrocannabinol: NOT DETECTED

## 2024-08-21 LAB — ETHANOL: Alcohol, Ethyl (B): 26 mg/dL — ABNORMAL HIGH (ref <15)

## 2024-08-21 MED ORDER — LIDOCAINE 5 % EX OINT: 1.0000 | TOPICAL_OINTMENT | CUTANEOUS | 0 refills | Status: DC | PRN

## 2024-08-21 NOTE — ED Provider Notes (Signed)
 [place] EMERGENCY DEPARTMENT AT [facility] Provider Note   CSN: [id number] Arrival date & time: 08/21/24  2033     Patient presents with: Paranoid   Cory Floyd is a 48 y.o. male.   48 year old male with a history of bipolar 1 disorder, schizophrenia, substance induced mood disorder, diabetes, hypertension presents to the emergency department complaining of paranoid state.  He has been noncompliant with his psychiatric medications for several months.  Was having some difficulty obtaining a prescription today.  Was feeling flustered because of this and became anxious and felt claustrophobic when at the pharmacy.  He felt that people were out to get him.  He recognizes that this feeling occurs when he is not taking his psychiatric meds.  Denies any thoughts of self-harm, suicide attempt, homicidal ideations.  The history is provided by the patient. No language interpreter was used.       Prior to Admission medications   Medication Sig Start Date End Date Taking? Authorizing Provider  lidocaine  (XYLOCAINE ) 5 % ointment Apply 1 Application topically as needed. 08/21/24   Lowther, Amy, DO    Allergies: Carrot [daucus carota], Penicillins, and Principen [ampicillin]    Review of Systems Ten systems reviewed and are negative for acute change, except as noted in the HPI.    Updated Vital Signs BP (!) 149/97 (BP Location: Right Arm)   Pulse (!) 128   Temp 97.8 F (36.6 C)   Resp 19   Ht 5' 10 (1.778 m)   Wt 90.7 kg   SpO2 100%   BMI 28.70 kg/m   Physical Exam Vitals and nursing note reviewed.  Constitutional:      General: He is not in acute distress.    Appearance: He is well-developed. He is not diaphoretic.     Comments: Nontoxic appearing and in NAD  HENT:     Head: Normocephalic and atraumatic.  Eyes:     General: No scleral icterus.    Conjunctiva/sclera: Conjunctivae normal.  Pulmonary:     Effort: Pulmonary effort is normal. No respiratory  distress.     Comments: Respirations even and unlabored Musculoskeletal:        General: Normal range of motion.     Cervical back: Normal range of motion.  Skin:    General: Skin is warm and dry.     Coloration: Skin is not pale.     Findings: No erythema or rash.  Neurological:     Mental Status: He is alert and oriented to person, place, and time.  Psychiatric:        Mood and Affect: Mood is anxious.        Speech: Speech is rapid and pressured.        Behavior: Behavior normal.        Thought Content: Thought content is paranoid. Thought content does not include homicidal or suicidal ideation.     (all labs ordered are listed, but only abnormal results are displayed) Labs Reviewed  ETHANOL - Abnormal; Notable for the following components:      Result Value   Alcohol, Ethyl (B) 26 (*)    All other components within normal limits  RAPID URINE DRUG SCREEN, HOSP PERFORMED - Abnormal; Notable for the following components:   Cocaine POSITIVE (*)    Amphetamines POSITIVE (*)    All other components within normal limits  COMPREHENSIVE METABOLIC PANEL WITH GFR  CBC    EKG: None  Radiology: No results found.  Procedures   Medications Ordered in the ED - No data to display  Clinical Course as of 08/21/24 2332  Thu Aug 21, 2024  2230 Tachycardia in triage suspected secondary to heightened state of anxiety.  Calm at the bedside during my assessment. [KH]    Clinical Course User Index [KH] Keith Sor, [name]                                 Medical Decision Making Amount and/or Complexity of Data Reviewed Labs: ordered.   This patient presents to the ED for concern of paranoia, this involves an extensive number of treatment options, and is a complaint that carries with it a high risk of complications and morbidity.  The differential diagnosis includes psychosis vs substance induced mood disorder.   Co morbidities that complicate the patient evaluation  Bipolar  d/o Schizophrenia  Polysubstance use   Additional history obtained:  External records from outside source obtained and reviewed including prior discharge summaries.   Lab Tests:  I Ordered, and personally interpreted labs.  The pertinent results include:  Ethanol 26. UDS positive for cocaine, amphetamines.    Cardiac Monitoring:  The patient was maintained on a cardiac monitor.  I personally viewed and interpreted the cardiac monitored which showed an underlying rhythm of: sinus tachycardia   Medicines ordered and prescription drug management:  I have reviewed the patients home medicines and have made adjustments as needed   Problem List / ED Course:  Feelings of paranoia likely related to medication noncompliance.  Does not appear to be a harm to himself or others.  Denies suicidal ideations or plan.  Not reacting to any internal stimuli.  Makes good eye contact and is able to carry on meaningful conversation. Would like to try and address his issues as an outpatient. Have referred to [facility] for evaluation on the 2nd floor during business hours tomorrow.    Reevaluation:  After the interventions noted above, I reevaluated the patient and found that they have :stayed the same   Social Determinants of Health:  Housing instability   Dispostion:  After consideration of the diagnostic results and the patients response to treatment, I feel that the patent would benefit from outpatient psychiatric assessment. Given information on [facility] for evaluation in the AM.  Do not feel patient necessitates emergent psychiatric evaluation.  Patient not harm to self or others; contracts for safety.  Return precautions discussed and provided. Patient discharged in stable condition with no unaddressed concerns.      Final diagnoses:  Paranoia [facility])    ED Discharge Orders     None          Keith Sor, [name] 08/21/24 2339    Raford Lenis, MD 08/22/24 [phone]

## 2024-08-21 NOTE — Telephone Encounter (Signed)
 Lidocaine  patch switched from [facility] to [facility]

## 2024-08-21 NOTE — Discharge Instructions (Addendum)
 Go to the behavioral health urgent care in the morning for urgent evaluation.  These assessments occur on the second floor.  They can try and resume your psychiatric medications.  You may return to the ED for new or concerning symptoms.

## 2024-08-21 NOTE — ED Triage Notes (Signed)
 Pt arrives POV, states that he has had intermittent episodes of paranoia. Pt states that he attempted to try to get medications today and went to [facility] and was met with difficulty obtaining medications r/t not being a pt with them anymore. Pt reports auditory hallucinations, denies SI/HI. Pt does however report that he feels like he needs to protect himself.

## 2024-08-21 NOTE — ED Triage Notes (Signed)
 C/O paranoid feeling, feeling unsafe at home. Reports needing Abilify  shot, unknown when had last dose.   Reports having difficulty with medication at pharmacy. Reports scripts sent to wrong pharmacy.

## 2024-09-04 ENCOUNTER — Emergency Department (HOSPITAL_COMMUNITY)

## 2024-09-04 ENCOUNTER — Emergency Department (HOSPITAL_COMMUNITY)
Admission: EM | Admit: 2024-09-04 | Discharge: 2024-09-05 | Disposition: A | Discharge status: Other Acute Inpatient Hospital | Source: Home / Self Care | Attending: Emergency Medicine | Admitting: Emergency Medicine

## 2024-09-04 DIAGNOSIS — F1490 Cocaine use, unspecified, uncomplicated: Secondary | ICD-10-CM | POA: Insufficient documentation

## 2024-09-04 DIAGNOSIS — F3110 Bipolar disorder, current episode manic without psychotic features, unspecified: Secondary | ICD-10-CM | POA: Insufficient documentation

## 2024-09-04 DIAGNOSIS — F15259 Other stimulant dependence with stimulant-induced psychotic disorder, unspecified: Secondary | ICD-10-CM | POA: Diagnosis not present

## 2024-09-04 DIAGNOSIS — R079 Chest pain, unspecified: Secondary | ICD-10-CM | POA: Insufficient documentation

## 2024-09-04 DIAGNOSIS — F319 Bipolar disorder, unspecified: Secondary | ICD-10-CM | POA: Diagnosis not present

## 2024-09-04 DIAGNOSIS — F22 Delusional disorders: Secondary | ICD-10-CM | POA: Insufficient documentation

## 2024-09-04 LAB — CBC
HCT: 41.4 % (ref 39.0–52.0)
Hemoglobin: 13.4 g/dL (ref 13.0–17.0)
MCH: 30.7 pg (ref 26.0–34.0)
MCHC: 32.4 g/dL (ref 30.0–36.0)
MCV: 94.7 fL (ref 80.0–100.0)
Platelets: 228 K/uL (ref 150–400)
RBC: 4.37 MIL/uL (ref 4.22–5.81)
RDW: 14 % (ref 11.5–15.5)
WBC: 9.2 K/uL (ref 4.0–10.5)
nRBC: 0 % (ref 0.0–0.2)

## 2024-09-04 LAB — RAPID URINE DRUG SCREEN, HOSP PERFORMED
Amphetamines: POSITIVE — AB
Barbiturates: NOT DETECTED
Benzodiazepines: NOT DETECTED
Cocaine: POSITIVE — AB
Opiates: NOT DETECTED
Tetrahydrocannabinol: NOT DETECTED

## 2024-09-04 LAB — COMPREHENSIVE METABOLIC PANEL WITH GFR
ALT: 28 U/L (ref 0–44)
AST: 33 U/L (ref 15–41)
Albumin: 4 g/dL (ref 3.5–5.0)
Alkaline Phosphatase: 71 U/L (ref 38–126)
Anion gap: 14 (ref 5–15)
BUN: 26 mg/dL — ABNORMAL HIGH (ref 6–20)
CO2: 24 mmol/L (ref 22–32)
Calcium: 9.3 mg/dL (ref 8.9–10.3)
Chloride: 102 mmol/L (ref 98–111)
Creatinine, Ser: 1.29 mg/dL — ABNORMAL HIGH (ref 0.61–1.24)
GFR, Estimated: >60 mL/min (ref >60)
Glucose, Bld: 105 mg/dL — ABNORMAL HIGH (ref 70–99)
Potassium: 4.3 mmol/L (ref 3.5–5.1)
Sodium: 140 mmol/L (ref 135–145)
Total Bilirubin: 1 mg/dL (ref 0.0–1.2)
Total Protein: 8 g/dL (ref 6.5–8.1)

## 2024-09-04 LAB — ETHANOL: Alcohol, Ethyl (B): <15 mg/dL (ref <15)

## 2024-09-04 LAB — TROPONIN I (HIGH SENSITIVITY): Troponin I (High Sensitivity): 5 ng/L (ref <18)

## 2024-09-04 NOTE — ED Provider Notes (Addendum)
 Patient awaiting medical clearance for behavioral health evaluation due to psychosis.  Complete metabolic panel creatinine 1.29 with GFR greater than 60 electrolytes normal liver function test normal.  Alcohol less than 15 CBC white count 9.2 hemoglobin 13.4 and platelets are 228 troponin was 5.  CT head was negative portable chest x-ray no acute findings.  Urine drug screen still pending.  Will put in for TTS consult.   Nesta Kimple, MD 09/04/24 1948  Patient awaiting TTS eval can be moved over to [facility].  Will do psych holding.   Patient medically cleared.   Rael Yo, MD 09/04/24 2154

## 2024-09-04 NOTE — ED Notes (Signed)
 This RN received patient at 1900 handoff to patient that is still fully dressed in street clothes, has cell phone and all belongings and is to be IVC'd. Patient's required documentation is not complete as of 1900 when shift changed. Patient still needs a UA and a Troponin drawn and is said to not be cooperative.

## 2024-09-04 NOTE — ED Notes (Signed)
 PT in agreement with getting labs now.

## 2024-09-04 NOTE — ED Notes (Signed)
 Case Number Update: [id number]

## 2024-09-04 NOTE — ED Notes (Signed)
 PT  just wants  to eat and drink at this time, PT not in agreement with getting dressed out, giving up belongings or giving blood at this time. Physician has been made aware, will notify Charge RN at this time

## 2024-09-04 NOTE — ED Provider Notes (Signed)
  EMERGENCY DEPARTMENT AT [facility] Provider Note   CSN: [id number] Arrival date & time: 09/04/24  1439     Patient presents with: CP, BH   Cory Floyd is a 48 y.o. male.   48 year old male with past medical history of psychiatric issues presenting to the emergency department today with hallucinations.  The patient was apparently brought in by medics after he was found at the bank acting abnormally.  The patient states that he is seeing all the people from my past.  He is denying any suicidal or homicidal ideations.  Even though he is denying any suicidal or homicidal ideations he is saying to me I do not care what you do but I will get them.  He will not elaborate on this.  He is very tangential when speaking with him and is very disorganized.  He does appear to be looking around the room and whispering inappropriately at times.        Prior to Admission medications   Medication Sig Start Date End Date Taking? Authorizing Provider  lidocaine  (XYLOCAINE ) 5 % ointment Apply 1 Application topically as needed. 08/21/24   Lowther, Amy, DO    Allergies: Carrot [daucus carota], Penicillins, and Principen [ampicillin]    Review of Systems  Psychiatric/Behavioral:  Positive for hallucinations.   All other systems reviewed and are negative.   Updated Vital Signs There were no vitals taken for this visit.  Physical Exam Vitals and nursing note reviewed.   Gen: Disheveled, no acute distress Eyes: PERRL, EOMI HEENT: no oropharyngeal swelling Neck: trachea midline Resp: clear to auscultation bilaterally Card: RRR, no murmurs, rubs, or gallops Abd: nontender, nondistended Extremities: no calf tenderness, no edema Vascular: 2+ radial pulses bilaterally, 2+ DP pulses bilaterally Skin: no rashes Psyc: Very tangential and paranoid   (all labs ordered are listed, but only abnormal results are displayed) Labs Reviewed  COMPREHENSIVE METABOLIC PANEL WITH  GFR  ETHANOL  CBC  RAPID URINE DRUG SCREEN, HOSP PERFORMED  TROPONIN I (HIGH SENSITIVITY)  TROPONIN I (HIGH SENSITIVITY)    EKG: EKG Interpretation Date/Time:  Thursday September 04 2024 16:03:39 EST Ventricular Rate:  104 PR Interval:  145 QRS Duration:  95 QT Interval:  330 QTC Calculation: 434 R Axis:   51  Text Interpretation: Sinus tachycardia Confirmed by Ula Barter [phone]) on 09/04/2024 4:07:04 PM  Radiology: DG Chest Portable 1 View Result Date: 09/04/2024 EXAM: 1 VIEW(S) XRAY OF THE CHEST 09/04/2024 03:57:00 PM COMPARISON: 04/25/2024 CLINICAL HISTORY: cp cp FINDINGS: LUNGS AND PLEURA: No focal pulmonary opacity. No pleural effusion. No pneumothorax. HEART AND MEDIASTINUM: No acute abnormality of the cardiac and mediastinal silhouettes. BONES AND SOFT TISSUES: No acute osseous abnormality. IMPRESSION: 1. No acute cardiopulmonary process. Electronically signed by: Lynwood Seip MD 09/04/2024 04:13 PM EST RP Workstation: [id number]     Procedures   Medications Ordered in the ED - No data to display                                  Medical Decision Making 48 year old male with past medical history of psychiatric illness presenting to the emergency department today with paranoia and disorganized thoughts with visual hallucinations he was brought in by medics for acting inappropriately at a bank as an outpatient.  No further evaluate the patient here with basic labs as well as an EKG and troponin.  It looks like he was seen a  few weeks ago with disorganized thoughts but it seems that his symptoms are worse than his most recent evaluation.  He has not followed up regarding his psychiatric illness and does report some substance use which is likely exacerbating things.  With him having these behaviors I will place patient on an IVC to have him evaluated by psychiatry here although he is denying any overt suicidal or homicidal ideations but he is making some vague threats and at this  point I do not know if he is making threats towards people he actually knows or people that he is hallucinating.  He will require psychiatric assessment.  The patient's EKG is nonischemic.  The remainder of the patient's workup is pending at the time of signout.  Plan is for psychiatric evaluation after his initial evaluation here for chest pain.  The patient reported that after eating something that his chest pain is resolved.  Suspicion for aortic dissection, pulmonary embolism, or other acute emergent etiology is low assuming his troponin here is negative.  Amount and/or Complexity of Data Reviewed Labs: ordered. Radiology: ordered.        Final diagnoses:  Paranoia (psychosis) (HCC)  Chest pain, unspecified type    ED Discharge Orders     None          Ula Prentice SAUNDERS, MD 09/04/24 [phone]

## 2024-09-04 NOTE — ED Notes (Signed)
 Patient states warm sensation in his chest. Informed doctor.

## 2024-09-04 NOTE — ED Triage Notes (Signed)
 Pt BIB [facility] from a bank.  He was endorsing CP and delusions or hallucinations about the people in the bank and how well he knows them.  Also that he was being followed by people.  He was allegedly dropped off at the bank by someone and all of his medications were in the car that brought him to bank.   128/73 HR 113 RR 20

## 2024-09-04 NOTE — ED Notes (Signed)
 Pt has been completely IVC'd; Case Number: [id number]; Original Copy in Red Folder; 1 in medical records; 3 copies are on clipboard in [place] with diplomatic services operational officer

## 2024-09-04 NOTE — ED Notes (Signed)
 PT allows NT to get a EKG from him at this time.

## 2024-09-04 NOTE — BH Assessment (Addendum)
 Patient was deferred to [facility] for a telepsych assessment. The assigned care coordinator will provide updates regarding the scheduling of the assessment. [facility] coordinator can be reached at [phone] for further information on the timing of the telepsych evaluation.

## 2024-09-05 ENCOUNTER — Inpatient Hospital Stay (HOSPITAL_COMMUNITY)
Admission: AD | Admit: 2024-09-05 | Discharge: 2024-09-19 | DRG: 885 | Disposition: A | Discharge status: Home / Self Care | Source: Intra-hospital

## 2024-09-05 ENCOUNTER — Encounter (HOSPITAL_COMMUNITY): Payer: Self-pay

## 2024-09-05 ENCOUNTER — Other Ambulatory Visit: Payer: Self-pay

## 2024-09-05 ENCOUNTER — Encounter (HOSPITAL_COMMUNITY): Payer: Self-pay | Admitting: Psychiatry

## 2024-09-05 DIAGNOSIS — F17210 Nicotine dependence, cigarettes, uncomplicated: Secondary | ICD-10-CM | POA: Diagnosis present

## 2024-09-05 DIAGNOSIS — F1414 Cocaine abuse with cocaine-induced mood disorder: Secondary | ICD-10-CM | POA: Diagnosis present

## 2024-09-05 DIAGNOSIS — F419 Anxiety disorder, unspecified: Secondary | ICD-10-CM | POA: Diagnosis present

## 2024-09-05 DIAGNOSIS — F19959 Other psychoactive substance use, unspecified with psychoactive substance-induced psychotic disorder, unspecified: Secondary | ICD-10-CM | POA: Diagnosis not present

## 2024-09-05 DIAGNOSIS — Z5900 Homelessness unspecified: Secondary | ICD-10-CM | POA: Diagnosis not present

## 2024-09-05 DIAGNOSIS — K760 Fatty (change of) liver, not elsewhere classified: Secondary | ICD-10-CM | POA: Diagnosis present

## 2024-09-05 DIAGNOSIS — Z9151 Personal history of suicidal behavior: Secondary | ICD-10-CM | POA: Diagnosis not present

## 2024-09-05 DIAGNOSIS — F1520 Other stimulant dependence, uncomplicated: Secondary | ICD-10-CM | POA: Diagnosis not present

## 2024-09-05 DIAGNOSIS — Z79899 Other long term (current) drug therapy: Secondary | ICD-10-CM | POA: Diagnosis not present

## 2024-09-05 DIAGNOSIS — F600 Paranoid personality disorder: Secondary | ICD-10-CM | POA: Diagnosis present

## 2024-09-05 DIAGNOSIS — E119 Type 2 diabetes mellitus without complications: Secondary | ICD-10-CM | POA: Diagnosis present

## 2024-09-05 DIAGNOSIS — Z5982 Transportation insecurity: Secondary | ICD-10-CM

## 2024-09-05 DIAGNOSIS — F15259 Other stimulant dependence with stimulant-induced psychotic disorder, unspecified: Secondary | ICD-10-CM | POA: Diagnosis present

## 2024-09-05 DIAGNOSIS — Z5941 Food insecurity: Secondary | ICD-10-CM | POA: Diagnosis not present

## 2024-09-05 DIAGNOSIS — Z8249 Family history of ischemic heart disease and other diseases of the circulatory system: Secondary | ICD-10-CM | POA: Diagnosis not present

## 2024-09-05 DIAGNOSIS — Z809 Family history of malignant neoplasm, unspecified: Secondary | ICD-10-CM | POA: Diagnosis not present

## 2024-09-05 DIAGNOSIS — Z91199 Patient's noncompliance with other medical treatment and regimen due to unspecified reason: Secondary | ICD-10-CM | POA: Diagnosis not present

## 2024-09-05 DIAGNOSIS — Z8616 Personal history of COVID-19: Secondary | ICD-10-CM | POA: Diagnosis not present

## 2024-09-05 DIAGNOSIS — Z860102 Personal history of hyperplastic colon polyps: Secondary | ICD-10-CM | POA: Diagnosis not present

## 2024-09-05 DIAGNOSIS — Z91018 Allergy to other foods: Secondary | ICD-10-CM | POA: Diagnosis not present

## 2024-09-05 DIAGNOSIS — I10 Essential (primary) hypertension: Secondary | ICD-10-CM | POA: Diagnosis present

## 2024-09-05 DIAGNOSIS — Z608 Other problems related to social environment: Secondary | ICD-10-CM | POA: Diagnosis present

## 2024-09-05 DIAGNOSIS — F319 Bipolar disorder, unspecified: Secondary | ICD-10-CM | POA: Diagnosis present

## 2024-09-05 DIAGNOSIS — Z91148 Patient's other noncompliance with medication regimen for other reason: Secondary | ICD-10-CM

## 2024-09-05 DIAGNOSIS — F1590 Other stimulant use, unspecified, uncomplicated: Secondary | ICD-10-CM | POA: Diagnosis not present

## 2024-09-05 DIAGNOSIS — M069 Rheumatoid arthritis, unspecified: Secondary | ICD-10-CM | POA: Diagnosis present

## 2024-09-05 DIAGNOSIS — F3110 Bipolar disorder, current episode manic without psychotic features, unspecified: Principal | ICD-10-CM | POA: Diagnosis present

## 2024-09-05 DIAGNOSIS — Z765 Malingerer [conscious simulation]: Secondary | ICD-10-CM | POA: Diagnosis not present

## 2024-09-05 DIAGNOSIS — Z880 Allergy status to penicillin: Secondary | ICD-10-CM | POA: Diagnosis not present

## 2024-09-05 DIAGNOSIS — F22 Delusional disorders: Secondary | ICD-10-CM | POA: Diagnosis present

## 2024-09-05 DIAGNOSIS — F29 Unspecified psychosis not due to a substance or known physiological condition: Secondary | ICD-10-CM | POA: Diagnosis not present

## 2024-09-05 MED ORDER — HALOPERIDOL 5 MG PO TABS: 5.0000 mg | ORAL_TABLET | Freq: Three times a day (TID) | ORAL | Status: DC | PRN

## 2024-09-05 MED ORDER — MAGNESIUM HYDROXIDE 400 MG/5ML PO SUSP: 30.0000 mL | Freq: Every day | ORAL | Status: DC | PRN

## 2024-09-05 MED ORDER — ACETAMINOPHEN 325 MG PO TABS
650.0000 mg | ORAL_TABLET | Freq: Four times a day (QID) | ORAL | Status: DC | PRN
  Administered 2024-09-07 – 2024-09-16 (×4): 650 mg via ORAL
  Filled 2024-09-05 (×4): qty 2

## 2024-09-05 MED ORDER — TRAZODONE HCL 100 MG PO TABS
100.0000 mg | ORAL_TABLET | Freq: Every evening | ORAL | Status: DC | PRN
  Administered 2024-09-05 – 2024-09-17 (×12): 200 mg via ORAL
  Filled 2024-09-05: qty 2
  Filled 2024-09-05: qty 1
  Filled 2024-09-05 (×2): qty 2
  Filled 2024-09-05: qty 14
  Filled 2024-09-05: qty 2
  Filled 2024-09-05: qty 1
  Filled 2024-09-05: qty 2
  Filled 2024-09-05: qty 1
  Filled 2024-09-05 (×5): qty 2

## 2024-09-05 MED ORDER — DIPHENHYDRAMINE HCL 50 MG/ML IJ SOLN: 50.0000 mg | Freq: Three times a day (TID) | INTRAMUSCULAR | Status: DC | PRN

## 2024-09-05 MED ORDER — OXCARBAZEPINE 300 MG PO TABS
300.0000 mg | ORAL_TABLET | Freq: Two times a day (BID) | ORAL | Status: DC
Start: 2024-09-05 — End: 2024-09-07
  Filled 2024-09-05: qty 1

## 2024-09-05 MED ORDER — ARIPIPRAZOLE 10 MG PO TABS
20.0000 mg | ORAL_TABLET | Freq: Every day | ORAL | Status: DC
Start: 2024-09-05 — End: 2024-09-07
  Filled 2024-09-05: qty 2

## 2024-09-05 MED ORDER — DIPHENHYDRAMINE HCL 50 MG/ML IJ SOLN
50.0000 mg | Freq: Three times a day (TID) | INTRAMUSCULAR | Status: DC | PRN
Start: 2024-09-05 — End: 2024-09-19

## 2024-09-05 MED ORDER — LORAZEPAM 2 MG/ML IJ SOLN: 2.0000 mg | Freq: Three times a day (TID) | INTRAMUSCULAR | Status: DC | PRN

## 2024-09-05 MED ORDER — HALOPERIDOL LACTATE 5 MG/ML IJ SOLN: 10.0000 mg | Freq: Three times a day (TID) | INTRAMUSCULAR | Status: DC | PRN

## 2024-09-05 MED ORDER — ENSURE PLUS HIGH PROTEIN PO LIQD
237.0000 mL | Freq: Two times a day (BID) | ORAL | Status: DC
  Filled 2024-09-05 (×3): qty 237

## 2024-09-05 MED ORDER — NICOTINE 21 MG/24HR TD PT24
21.0000 mg | MEDICATED_PATCH | Freq: Every day | TRANSDERMAL | Status: DC
  Filled 2024-09-05 (×7): qty 1

## 2024-09-05 MED ORDER — DIPHENHYDRAMINE HCL 25 MG PO CAPS: 50.0000 mg | ORAL_CAPSULE | Freq: Three times a day (TID) | ORAL | Status: DC | PRN

## 2024-09-05 MED ORDER — ALUM & MAG HYDROXIDE-SIMETH 200-200-20 MG/5ML PO SUSP: 30.0000 mL | ORAL | Status: DC | PRN

## 2024-09-05 MED ORDER — MIRTAZAPINE 15 MG PO TABS
15.0000 mg | ORAL_TABLET | Freq: Every day | ORAL | Status: DC
  Filled 2024-09-05: qty 1

## 2024-09-05 MED ORDER — NICOTINE 21 MG/24HR TD PT24: 21.0000 mg | MEDICATED_PATCH | Freq: Every day | TRANSDERMAL | Status: DC

## 2024-09-05 MED ORDER — HALOPERIDOL LACTATE 5 MG/ML IJ SOLN: 5.0000 mg | Freq: Three times a day (TID) | INTRAMUSCULAR | Status: DC | PRN

## 2024-09-05 NOTE — ED Notes (Signed)
 Report called to [facility] for patient to arrive after 1530.

## 2024-09-05 NOTE — [facility] Group Notes (Signed)
 Rufino did not attend wrap up grouip.

## 2024-09-05 NOTE — Progress Notes (Signed)
 [facility] Admission Note:  Patient is a 48 year old male admitted under IVC due to paranoia and disorganized thought after he was found to be acting inappropriately at a bank. Brought to ED by police. Patient is irritable, and suspicious on admission. Patient denies SI/HI/AVH and is uncooperative with much of the admission process. Patient refused to sign any forms stating I'm here involuntarily so why would I sign anything. Patient repeatedly asks to see his IVC paperwork and wants to know who put me here. Skin was assessed with [facility] and found to be WNL. Fall risk was assessed as low and patient verbalized understanding of fall prevention education. Allergies to carrots and penicillins were reviewed with patient. Patient was oriented to the unit and provided meal and fluids. Safety checks were initiated at 15 minute intervals.

## 2024-09-05 NOTE — ED Notes (Signed)
 Patient in bed talking to himself. When asked if he wants a blanket, he continues talking to himself.

## 2024-09-05 NOTE — ED Provider Notes (Addendum)
 Emergency Medicine Observation Re-evaluation Note  Cory Floyd is a 48 y.o. male, seen on rounds today.  Pt initially presented to the ED for complaints of CP, BH Currently, the patient is resting comfortably in ED bed.  Physical Exam  BP 110/67 (BP Location: Left Arm)   Pulse 73   Temp 98 F (36.7 C) (Oral)   Resp 20   Ht 5' 10 (1.778 m)   Wt 90.7 kg   SpO2 98%   BMI 28.70 kg/m  Physical Exam General: Awake. Alert. No acute distress Cardiac: Regular rate rhythm Lungs: Clear to auscultation bilaterally Psych: Calm and cooperative  ED Course / MDM  EKG:EKG Interpretation Date/Time:  Thursday September 04 2024 16:03:39 EST Ventricular Rate:  104 PR Interval:  145 QRS Duration:  95 QT Interval:  330 QTC Calculation: 434 R Axis:   51  Text Interpretation: Sinus tachycardia Confirmed by Ula Barter [phone]) on 09/04/2024 4:07:04 PM  I have reviewed the labs performed to date as well as medications administered while in observation.  Recent changes in the last 24 hours include initial TTS assessment completed yesterday evening.  TTS team recommends inpatient psychiatric hospitalization.  IVC remains in place  Plan  Current plan is for continued boarding in the ED awaiting inpatient psychiatric placement.  1650 accepted to behavioral health [facility] is accepting physician stable for transport  Cory Floyd LABOR, DO 09/05/24 0824    Cory Floyd LABOR, DO 09/05/24 1659

## 2024-09-05 NOTE — Tx Team (Signed)
 Initial Treatment Plan 09/05/2024 5:50 PM Cory Floyd [id number]    PATIENT STRESSORS: Financial difficulties   Medication change or noncompliance   Substance abuse     PATIENT STRENGTHS: Capable of independent living    PATIENT IDENTIFIED PROBLEMS: I need to see my paperwork                     DISCHARGE CRITERIA:  Adequate post-discharge living arrangements Improved stabilization in mood, thinking, and/or behavior  PRELIMINARY DISCHARGE PLAN: Outpatient therapy  PATIENT/FAMILY INVOLVEMENT: This treatment plan has been presented to and reviewed with the patient, [name].  The patient has been given the opportunity to ask questions and make suggestions.  Annalee Larch, RN 09/05/2024, 5:50 PM

## 2024-09-05 NOTE — BH Assessment (Addendum)
 Comprehensive Clinical Assessment (CCA) Note  09/05/2024 Cory Floyd [id number] Disposition: Clinician discussed patient care with Richerd Ivans, NP.  She recommended inpatient care.  Patient is already on IVC initiated by Dr. Ula.  CSW to assist with placement.    Pt is irritated with having to have mental health assessment.  He has avoidant eye contact.  He is oriented x4.  Pt has a lot of stressors.  He speaks clearly and coherently.  He is however convinced that he saw people from his past at the bank today following him.  He believes that they are meaning to do him harm.  Pt has poor sleep and appetite.  He also had amphetamines in his UDS.    Pt has no current outpatient care.     Chief Complaint:  Chief Complaint  Patient presents with   CP, BH   Visit Diagnosis: Bipolar d/o manic; cocaine use d/o   CCA Screening, Triage and Referral (STR)  Patient Reported Information How did you hear about us ? Other (Comment) (EMS)  What Is the Reason for Your Visit/Call Today? Pt was at a bank this afternoon when he started to have some chest and side pain and he called 911.  EMS brought him to [facility].  Pt says that he has been seeing people from his past.  He says that they are not hallucinations and he is not hearing voices.  Patient says I know the end is coming soon.  Patient says that people from his past were at the bank and he thinks that they are going to do him harm.  Patient talks about these people laughing at him when he tries to confront them.   He denies any SI or HI.  Patient has no current outpatient care.  Patient has missed a court date from yesterday.  He is worried about having to go to prison for missiing court.  He says I know what is going on.  Pt says it has been a couple weeks since he used any substances.  Pt is positive for cocaine and amphetamines.  He is worried also about his belongings being in the car of someone that he does not trust.  He says he is supposed  to be starting a new job on Monday also.  He says also that he has physical pain in his abdomin, stomach, and thighs cramp up at night.  Pt says he has been at the ED 3 times in last few weeks with physical pains.  Pt denies having any access to a gun.  Pt says he has to be in front of a judge in [facility] before the end of the week.  He has multiple life stressors.  How Long Has This Been Causing You Problems? 1 wk - 1 month  What Do You Feel Would Help You the Most Today? Treatment for Depression or other mood problem   Have You Recently Had Any Thoughts About Hurting Yourself? No  Are You Planning to Commit Suicide/Harm Yourself At This time? No   Flowsheet Row ED from 09/04/2024 in [facility] Emergency Department at [facility] ED from 08/21/2024 in [facility] Department at [facility] ED from 08/19/2024 in [facility] Emergency Department at [facility]  C-SSRS RISK CATEGORY No Risk No Risk No Risk    Have you Recently Had Thoughts About Hurting Someone Sher[place]? No  Are You Planning to Harm Someone at This Time? No  Explanation: Patient is denying  SI or HI.,   Have You Used Any Alcohol or Drugs in the Past 24 Hours? No (Pt denies.  But is positive for amphetamines and cocaine)  How Long Ago Did You Use Drugs or Alcohol? No data recorded What Did You Use and How Much? No data recorded  Do You Currently Have a Therapist/Psychiatrist? No  Name of Therapist/Psychiatrist:    Have You Been Recently Discharged From Any Office Practice or Programs? No  Explanation of Discharge From Practice/Program: No data recorded    CCA Screening Triage Referral Assessment Type of Contact: Tele-Assessment  Telemedicine Service Delivery:   Is this Initial or Reassessment? Is this Initial or Reassessment?: Initial Assessment  Date Telepsych consult ordered in [facility]:  Date Telepsych consult ordered in [facility]: 09/04/24  Time Telepsych consult ordered in [facility]:   Time Telepsych consult ordered in [facility]: 1949  Location of Assessment: [facility] ED  Provider Location: [facility] Assessment Services   Collateral Involvement: None   Does Patient Have a Automotive Engineer Guardian? No  Legal Guardian Contact Information: Patient has no legal guardian  Copy of Legal Guardianship Form: -- (Patient has no legal guardian)  Legal Guardian Notified of Arrival: -- (Patient has no legal guardian)  Legal Guardian Notified of Pending Discharge: -- (Patient has no legal guardian)  If Minor and Not Living with Parent(s), Who has Custody? N/A  Is CPS involved or ever been involved? Never  Is APS involved or ever been involved? Never   Patient Determined To Be At Risk for Harm To Self or Others Based on Review of Patient Reported Information or Presenting Complaint? No  Method: No Plan  Availability of Means: No access or NA  Intent: Vague intent or NA  Notification Required: No need or identified person  Additional Information for Danger to Others Potential: Active psychosis  Additional Comments for Danger to Others Potential: Pt denies HI.  Are There Guns or Other Weapons in Your Home? No  Types of Guns/Weapons: No guns  Are These Weapons Safely Secured?                            No  Who Could Verify You Are Able To Have These Secured: Patient says he has no access to guns.  Do You Have any Outstanding Charges, Pending Court Dates, Parole/Probation? Pt had court yesterday tthat he missed.  He is worried he will have to go to prison.  Contacted To Inform of Risk of Harm To Self or Others: Other: Comment (Pt denying HI.)    Does Patient Present under Involuntary Commitment? Yes (EDP initiated)    [place] of Residence: [place]   Patient Currently Receiving the Following Services: Not Receiving Services   Determination of Need: Urgent (48 hours)   Options For Referral: Inpatient Hospitalization     CCA Biopsychosocial Patient  Reported Schizophrenia/Schizoaffective Diagnosis in Past: No   Strengths: Pt able to assert needs   Mental Health Symptoms Depression:  Change in energy/activity; Hopelessness; Increase/decrease in appetite; Irritability; Sleep (too much or little)   Duration of Depressive symptoms: Duration of Depressive Symptoms: Greater than two weeks   Mania:  Recklessness; Irritability   Anxiety:   Fatigue; Irritability; Restlessness; Sleep; Tension; Worrying   Psychosis:  Hallucinations   Duration of Psychotic symptoms: Duration of Psychotic Symptoms: Less than six months   Trauma:  None   Obsessions:  Intrusive/time consuming; Disrupts routine/functioning; Recurrent & persistent thoughts/impulses/images   Compulsions:  Not connected to stressor   Inattention:  N/A   Hyperactivity/Impulsivity:  N/A   Oppositional/Defiant Behaviors:  None   Emotional Irregularity:  Potentially harmful impulsivity; Transient, stress-related paranoia/disassociation   Other Mood/Personality Symptoms:  None noted    Mental Status Exam Appearance and self-care  Stature:  Average   Weight:  Average weight   Clothing:  Casual (Pt has been refusing to change to scrubs.)   Grooming:  Normal   Cosmetic use:  None   Posture/gait:  Tense   Motor activity:  Restless   Sensorium  Attention:  Normal   Concentration:  Normal   Orientation:  X5   Recall/memory:  Normal   Affect and Mood  Affect:  Negative; Depressed   Mood:  Depressed; Irritable   Relating  Eye contact:  None; Avoided   Facial expression:  Depressed; Tense   Attitude toward examiner:  Cooperative   Thought and Language  Speech flow: Clear and Coherent   Thought content:  Appropriate to Mood and Circumstances   Preoccupation:  None   Hallucinations:  Visual   Organization:  Goal-directed; Intact   [employer] of Knowledge:  Fair   Intelligence:  Average   Abstraction:  Functional   Judgement:   Poor   Reality Testing:  Distorted   Insight:  Lacking; Poor   Decision Making:  Impulsive   Social Functioning  Social Maturity:  Impulsive   Social Judgement:  Victimized   Stress  Stressors:  Grief/losses; Family conflict; Illness; Legal; Work   Coping Ability:  Overwhelmed; Exhausted   Skill Deficits:  Responsibility; Interpersonal; Self-care   Supports:  Support needed     Religion: Religion/Spirituality Are You A Religious Person?: No How Might This Affect Treatment?: No affect on treatment  Leisure/Recreation: Leisure / Recreation Do You Have Hobbies?: No  Exercise/Diet: Exercise/Diet Do You Exercise?: No Have You Gained or Lost A Significant Amount of Weight in the Past Six Months?: No Do You Follow a Special Diet?: No Do You Have Any Trouble Sleeping?:  (UTA)   CCA Employment/Education Employment/Work Situation: Employment / Work Situation Employment Situation: Unemployed (Supposed to start a new job on 11/17) Patient's Job has Been Impacted by Current Illness: No Has Patient ever Been in the [employer]?: No  Education: Education Is Patient Currently Attending School?: No Last Grade Completed:  (UTA) Did You Attend College?: No Did You Have An Individualized Education Program (IIEP): No Did You Have Any Difficulty At School?: No Patient's Education Has Been Impacted by Current Illness: No   CCA Family/Childhood History Family and Relationship History: Family history Marital status: Single Does patient have children?: Yes How many children?: 2 How is patient's relationship with their children?: I don't talk to my kids  Childhood History:  Childhood History By whom was/is the patient raised?: Grandparents Did patient suffer any verbal/emotional/physical/sexual abuse as a child?:  (Pt did not answer.) Did patient suffer from severe childhood neglect?: No Has patient ever been sexually abused/assaulted/raped as an adolescent or adult?:  (Pt  declined to answer.) Was the patient ever a victim of a crime or a disaster?: No Witnessed domestic violence?: No Has patient been affected by domestic violence as an adult?: No       CCA Substance Use Alcohol/Drug Use: Alcohol / Drug Use Pain Medications: See MAR Prescriptions: See MAR Over the Counter: See MAR History of alcohol / drug use?: Yes Longest period of sobriety (when/how long): Pt unable to estimate 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,  Work / Programmer, Multimedia, Games Developer Symptoms: None Substance #1 Name of Substance 1: Cocaine 1 - Age of First Use: Unknown 1 - Amount (size/oz): Varies 1 - Frequency: Every few days 1 - Duration: off and on 1 - Last Use / Amount: A few weeks ago (but is positive for it on UDS) 1 - Method of Aquiring: illegal puchase 1- Route of Use: smoking Substance #2 Name of Substance 2: ETOH 2 - Age of First Use: Teens 2 - Amount (size/oz): Varies 2 - Frequency: Every few days 2 - Duration: ongoing 2 - Last Use / Amount: Pt says it has bene a few weeks 2 - Method of Aquiring: purchase 2 - Route of Substance Use: oral                     ASAM's:  Six Dimensions of Multidimensional Assessment  Dimension 1:  Acute Intoxication and/or Withdrawal Potential:      Dimension 2:  Biomedical Conditions and Complications:      Dimension 3:  Emotional, Behavioral, or Cognitive Conditions and Complications:     Dimension 4:  Readiness to Change:     Dimension 5:  Relapse, Continued use, or Continued Problem Potential:     Dimension 6:  Recovery/Living Environment:     ASAM Severity Score:    ASAM Recommended Level of Treatment:     Substance use Disorder (SUD)    Recommendations for Services/Supports/Treatments:    Disposition Recommendation per psychiatric provider: We recommend inpatient psychiatric hospitalization when medically cleared. Patient is under voluntary admission status at this time; please IVC if attempts to leave  hospital. EDP has initiated IVC.     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   Bipolar disorder with severe depression (HCC) 05/02/2021   Bipolar 1 disorder, depressed, severe (HCC) 04/02/2021   Cocaine abuse with cocaine-induced mood disorder (HCC) 04/02/2021   Bipolar disorder (HCC) 01/24/2021   Substance induced mood disorder (HCC) 07/26/2020   Suicidal ideation    Polysubstance dependence (HCC) 07/25/2020   Bipolar affective disorder, depressed, moderate (HCC) 03/06/2020   Bipolar 1 disorder (HCC) 09/30/2019   Arthritis 09/30/2019   Gastroesophageal reflux disease without esophagitis 09/21/2016   Obstructive sleep apnea on CPAP 09/21/2016   Morbid obesity with BMI of 40.0-44.9, adult (HCC) 08/15/2016   Stable angina pectoris 08/15/2016     Referrals to Alternative Service(s): Referred to Alternative Service(s):   Place:   Date:   Time:    Referred to Alternative Service(s):   Place:   Date:   Time:    Referred to Alternative Service(s):   Place:   Date:   Time:    Referred to Alternative Service(s):   Place:   Date:   Time:     Mitchell Jerona Levander HENRI

## 2024-09-05 NOTE — ED Notes (Signed)
 Per Dr. Zackowski pt does not need the second troponin drawn.

## 2024-09-05 NOTE — ED Notes (Addendum)
 Security arrived to facilitate with the patient changing out into the paper scrubs and collecting his belongings.

## 2024-09-05 NOTE — ED Notes (Signed)
 Case Number: [id number] 3 copies verified and current. Expires 09/11/2024

## 2024-09-05 NOTE — ED Notes (Signed)
Patient's belongings in [place].

## 2024-09-05 NOTE — ED Notes (Signed)
 Transport set up for patient to be taken to [facility] after 1530 today.

## 2024-09-05 NOTE — Progress Notes (Signed)
 Pt refused all meds except Trazodone , states he wishes to speak to doctor as soon as possible.  09/05/24 2132  Psych Admission Type (Psych Patients Only)  Admission Status Involuntary  Psychosocial Assessment  Patient Complaints Anger;Anxiety;Irritability;Suspiciousness  Eye Contact Intense;Suspiciousness  Facial Expression Animated  Affect Labile;Irritable  Speech Argumentative  Interaction Hostile;Guarded  Motor Activity Fidgety  Appearance/Hygiene Body odor;Disheveled;Poor hygiene  Behavior Characteristics Irritable;Guarded  Mood Labile;Irritable  Thought Process  Coherency Tangential;Disorganized  Content Paranoia;Delusions  Delusions Paranoid  Perception Derealization  Hallucination None reported or observed  Judgment Poor  Confusion Mild  Danger to Self  Current suicidal ideation? Denies  Danger to Others  Danger to Others None reported or observed

## 2024-09-05 NOTE — ED Notes (Signed)
 Patient talking to Psychiatry.

## 2024-09-05 NOTE — Progress Notes (Signed)
 Pt has been accepted to [facility] on 09/05/2024 Bed assignment: 400-02  Pt meets inpatient criteria per: Richerd Ivans NP  Attending Physician will be: Leita Arts MD  Report can be called to: unit: Adult unit: [phone]  Pt can arrive after 3:30 PM   Care Team Notified: [facility] McNichol RN, Jerel Gravely NP, Little Justice RN  [place] Ka Flammer LCSW-A   09/05/2024 11:45 AM

## 2024-09-06 DIAGNOSIS — F1520 Other stimulant dependence, uncomplicated: Secondary | ICD-10-CM | POA: Insufficient documentation

## 2024-09-06 MED ORDER — ENSURE PLUS HIGH PROTEIN PO LIQD
237.0000 mL | Freq: Two times a day (BID) | ORAL | Status: DC
  Administered 2024-09-06 – 2024-09-17 (×19): 237 mL via ORAL
  Filled 2024-09-06 (×27): qty 237

## 2024-09-06 MED ORDER — SALINE SPRAY 0.65 % NA SOLN
1.0000 | Freq: Two times a day (BID) | NASAL | Status: DC | PRN
  Administered 2024-09-07: 1 via NASAL
  Filled 2024-09-06: qty 44

## 2024-09-06 NOTE — Group Note (Signed)
 [facility] LCSW Group Therapy Note    Group Date: 09/06/2024 Start Time: 1000 End Time: 1100  Type of Therapy and Topic:  Group Therapy:  Overcoming Obstacles  Participation Level:  [facility] PARTICIPATION LEVEL: Did Not Attend  Mood:  Description of Group:   In this group patients will be encouraged to explore what they see as obstacles to their own wellness and recovery. They will be guided to discuss their thoughts, feelings, and behaviors related to these obstacles. The group will process together ways to cope with barriers, with attention given to specific choices patients can make. Each patient will be challenged to identify changes they are motivated to make in order to overcome their obstacles. This group will be process-oriented, with patients participating in exploration of their own experiences as well as giving and receiving support and challenge from other group members.  Therapeutic Goals: 1. Patient will identify personal and current obstacles as they relate to admission. 2. Patient will identify barriers that currently interfere with their wellness or overcoming obstacles.  3. Patient will identify feelings, thought process and behaviors related to these barriers. 4. Patient will identify two changes they are willing to make to overcome these obstacles:    Summary of Patient Progress   NA   Therapeutic Modalities:   Cognitive Behavioral Therapy Solution Focused Therapy Motivational Interviewing Relapse Prevention Therapy   Jenise Iannelli O Keaundre Thelin, LCSWA

## 2024-09-06 NOTE — Group Note (Signed)
 Date:  09/06/2024 Time:  4:23 PM  Group Topic/Focus:  Overcoming Stress:   The focus of this group is to define stress and help patients assess their triggers.    Participation Level:  Did Not Attend    Cory Floyd 09/06/2024, 4:23 PM

## 2024-09-06 NOTE — Progress Notes (Signed)
Patient is refusing vitals and medications

## 2024-09-06 NOTE — Progress Notes (Signed)
   09/06/24 1500  Psych Admission Type (Psych Patients Only)  Admission Status Involuntary  Psychosocial Assessment  Patient Complaints Irritability;Suspiciousness  Eye Contact Suspiciousness;Glaring  Facial Expression Angry  Affect Labile;Irritable  Speech Argumentative  Interaction Hostile;Guarded  Motor Activity Fidgety  Appearance/Hygiene Body odor;Disheveled  Behavior Characteristics Irritable;Guarded  Mood Irritable;Labile  Thought Process  Coherency Disorganized;Tangential  Content Paranoia;Delusions  Delusions Paranoid  Perception Derealization  Hallucination None reported or observed  Judgment Poor  Confusion Mild  Danger to Self  Current suicidal ideation? Denies  Danger to Others  Danger to Others None reported or observed

## 2024-09-06 NOTE — Plan of Care (Signed)
   Problem: Education: Goal: Emotional status will improve Outcome: Progressing Goal: Mental status will improve Outcome: Progressing Goal: Verbalization of understanding the information provided will improve Outcome: Progressing   Problem: Activity: Goal: Interest or engagement in activities will improve Outcome: Progressing Goal: Sleeping patterns will improve Outcome: Progressing

## 2024-09-06 NOTE — Group Note (Signed)
 Date:  09/06/2024 Time:  11:02 AM  Group Topic/Focus: Social work To help members understand different types of therapy, compare their benefits, and choose approaches that best meet their mental health needs.      Participation Level:  Did Not Attend   Cory Floyd 09/06/2024, 11:02 AM

## 2024-09-06 NOTE — Progress Notes (Signed)
(  Sleep Hours) - 8.5 hours (Any PRNs that were needed, meds refused, or side effects to meds)-  Trazodone  given, Pt refused Remeron  (states that is for cravings), Abilify  refused (Any disturbances and when (visitation, over night)- None reported (Concerns raised by the patient)-  Pt states that he has not spoken to doctor and wishes to do so ASAP (SI/HI/AVH)-  Denies, does appear to be responding to internal stimuli but denies this
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
## 2024-09-06 NOTE — Group Note (Signed)
 Date:  09/06/2024 Time:  9:09 PM  Group Topic/Focus:  Wrap-Up Group:   The focus of this group is to help patients review their daily goal of treatment and discuss progress on daily workbooks.    Participation Level:  Did Not Attend  Participation Quality:  n/a  Affect:  n/a  Cognitive:  n/a  Insight: None  Engagement in Group:  n/a  Modes of Intervention:  n/a  Additional Comments:  Patient was encouraged but did not attend   Eward Mace 09/06/2024, 9:09 PM

## 2024-09-06 NOTE — Plan of Care (Signed)
   Problem: Education: Goal: Knowledge of [place] [facility] Education information/materials will improve Outcome: Progressing

## 2024-09-06 NOTE — Group Note (Signed)
 Date:  09/06/2024 Time:  10:33 AM  Group Topic/Focus: Social wellness and orientation goals Group Goals Group:   The focus of this group is to help patients establish daily goals to achieve during treatment and discuss how the patient can incorporate goal setting into their daily lives to aide in recovery. Orientation:   The focus of this group is to educate the patient on the purpose and policies of crisis stabilization and provide a format to answer questions about their admission.  The group details unit policies and expectations of patients while admitted.    Participation Level:  Did Not Attend   Dolores CHRISTELLA Fredericks 09/06/2024, 10:33 AM

## 2024-09-06 NOTE — Progress Notes (Signed)
   09/06/24 2029  Psych Admission Type (Psych Patients Only)  Admission Status Involuntary  Psychosocial Assessment  Patient Complaints Suspiciousness;Irritability  Eye Contact Intense;Suspiciousness  Facial Expression Angry  Affect Irritable;Labile  Speech Argumentative  Interaction Guarded;Forwards little  Motor Activity Fidgety  Appearance/Hygiene Disheveled;Body odor  Behavior Characteristics Guarded;Irritable  Mood Irritable;Labile  Thought Process  Coherency Tangential  Content Delusions;Paranoia  Delusions Paranoid  Perception Derealization  Hallucination None reported or observed  Judgment Poor  Confusion Mild  Danger to Self  Current suicidal ideation? Denies  Danger to Others  Danger to Others None reported or observed
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
## 2024-09-07 DIAGNOSIS — F1520 Other stimulant dependence, uncomplicated: Secondary | ICD-10-CM

## 2024-09-07 DIAGNOSIS — F19959 Other psychoactive substance use, unspecified with psychoactive substance-induced psychotic disorder, unspecified: Secondary | ICD-10-CM

## 2024-09-07 DIAGNOSIS — F319 Bipolar disorder, unspecified: Secondary | ICD-10-CM

## 2024-09-07 MED ORDER — LIDOCAINE 5 % EX PTCH
1.0000 | MEDICATED_PATCH | Freq: Every day | CUTANEOUS | Status: DC
  Administered 2024-09-07 – 2024-09-13 (×2): 1 via TRANSDERMAL
  Filled 2024-09-07 (×8): qty 1

## 2024-09-07 MED ORDER — ARIPIPRAZOLE 10 MG PO TABS
10.0000 mg | ORAL_TABLET | Freq: Every day | ORAL | Status: DC
  Administered 2024-09-10 – 2024-09-14 (×5): 10 mg via ORAL
  Filled 2024-09-07 (×6): qty 1

## 2024-09-07 NOTE — Group Note (Signed)
 Date:  09/07/2024 Time:  6:50 PM   Group Topic/Focus:  Group used a non- competitive card game to build mindful listening skills, and encourage acceptance and understanding of oneself and peers. Patients cooperate and share, fostering empathy and personal growth in a supportive environment.      Participation Level:  Did Not Attend   Cory Floyd 09/07/2024, 6:50 PM

## 2024-09-07 NOTE — Plan of Care (Signed)
   Problem: Education: Goal: Verbalization of understanding the information provided will improve Outcome: Progressing   Problem: Activity: Goal: Interest or engagement in activities will improve Outcome: Not Progressing

## 2024-09-07 NOTE — Group Note (Signed)
 Date:  09/07/2024 Time:  9:11 PM  Group Topic/Focus:  Wrap-Up Group:   The focus of this group is to help patients review their daily goal of treatment and discuss progress on daily workbooks.    Participation Level:  Did Not Attend  Participation Quality:  N/A  Affect:  N/A  Cognitive:  N/A  Insight: None  Engagement in Group:  N/A  Modes of Intervention:  N/A  Additional Comments:  Patient was encouraged but did not attend  Eward Mace 09/07/2024, 9:11 PM

## 2024-09-07 NOTE — Progress Notes (Signed)
 [facility] MD Progress Note  09/07/2024 8:13 AM ABDULRAHMAN BRACEY  MRN:  [id number]  Principal Problem: Psychoactive substance-induced psychosis (HCC) Diagnosis: Principal Problem:   Psychoactive substance-induced psychosis (HCC) Active Problems:   Bipolar affective disorder, current episode manic (HCC)   Severe stimulant use disorder (HCC)  Total Time spent with patient: 35 minutes  Identify Information and reason for admission The patient is a 47 y.o. male with a psychiatric history of underlying bipolar 1 disorder as well as substance-induced psychosis and severe stimulant use disorder. He presents on this admission with manic and psychotic symptoms in the community (bizarre behavior at the bank) in the context of substance use - admitted under IVC.  Interval events: Per chart review the patient remains hostile and argumentative, is not attending groups, refusing all medications except trazodone . Slept 10 hrs overnight. BP 122/80 (BP Location: Right Arm)   Pulse (!) 102   Temp 98.5 F (36.9 C) (Oral)   Resp 20   Ht 5' 10 (1.778 m)   Wt 90.7 kg   SpO2 100%   BMI 28.70 kg/m    Interview today: -limited to non-cooperation and somnolence Today the patient states he is tired. Engages to a very limited degree. Patient states he doesn't know why he is here in the hospital. When attempting to ask him about his medications, he does not respond except to again request nasal spray. Does not volunteer additional information. No SI, HI or AVH reported.   Past Psychiatric Hx: Current Psychiatrist: none, was previously seen by ACT team but dropped due to medication noncompliance related to loss of provider  Current Therapist: none reported in chart review  Previous Psychiatric Diagnoses: Bipolar I disorder, substance-induced psychosis, cocaine abuse with cocaine-induced mood disorder Current psychiatric medications: trileptal 300 BID, remeron  15, abilify  20mg  at bedtime  Psychiatric medication  history/compliance: history of medication non-compliance. Past medications include depakote , this was discontinued in the setting of medication non-compliance. Psychiatric Hospitalization hx: [facility] 12/2022 (SI, depression), 04/2021 (SI), 03/2021 (SI with plan to shoot self with gun), 01/2021 (aggressive behaviors and SI/HI threats) Psychotherapy hx: unknown Neuromodulation history: none History of suicide: yes, via IV drug use History of homicide or aggression (obtained in HPI): yes, 01/2021 hospitalization   Substance Abuse Hx: (Frequency, quantity, last use, impact) UDS+cocaine, amphetamines.  Patient has a history of IVDU.  Per chart review, patient has past history of stimulant use disorder (meth, cocaine) and tobacco use   Past Medical History:  Past Medical History:  Diagnosis Date   Alcoholic (HCC)    clean for 3 years   Anginal pain    Arthritis    Bipolar 1 disorder (HCC)    COVID-19    Depressed    Diabetes mellitus without complication (HCC)    Dyspnea    Fatty liver    Hypertension    Schizophrenia (HCC)     Past Surgical History:  Procedure Laterality Date   COLONOSCOPY  02/05/2014   diverticulosis, hyperplastic polyp x 2   fracture arm Right    INCISION AND DRAINAGE ABSCESS Bilateral 12/29/2022   Procedure: INCISION AND DRAINAGE ABSCESS BILATERAL ARMS;  Surgeon: Kendal Franky SQUIBB, MD;  Location: [facility] OR;  Service: Orthopedics;  Laterality: Bilateral;   KNEE SURGERY Right 2013   NASAL SEPTUM SURGERY     NASAL TURBINATE REDUCTION Bilateral 12/12/2016   Procedure: TURBINATE REDUCTION/SUBMUCOSAL RESECTION;  Surgeon: Chinita Hasten, MD;  Location: [facility] ORS;  Service: ENT;  Laterality: Bilateral;   Family History:  Family History  Problem Relation Age of Onset   Hepatitis C Mother    Cancer Mother    Cancer Maternal Aunt    Hypertension Maternal Grandmother     Social History:  Social History   Substance and Sexual Activity  Alcohol Use Yes   Comment: 1/2-5th pint of  liquor     Social History   Substance and Sexual Activity  Drug Use Yes   Types: Cocaine, Marijuana   Comment: heroin    Social History   Socioeconomic History   Marital status: Divorced    Spouse name: Not on file   Number of children: Not on file   Years of education: Not on file   Highest education level: Not on file  Occupational History   Not on file  Tobacco Use   Smoking status: Every Day    Current packs/day: 1.00    Average packs/day: 1 pack/day for 20.0 years (20.0 ttl pk-yrs)    Types: Cigarettes   Smokeless tobacco: Never  Vaping Use   Vaping status: Never Used  Substance and Sexual Activity   Alcohol use: Yes    Comment: 1/2-5th pint of liquor   Drug use: Yes    Types: Cocaine, Marijuana    Comment: heroin   Sexual activity: Yes    Birth control/protection: None  Other Topics Concern   Not on file  Social History Narrative   Not on file   Social Drivers of Health   Financial Resource Strain: Not on file  Food Insecurity: Food Insecurity Present (09/05/2024)   Hunger Vital Sign    Worried About Running Out of Food in the Last Year: Often true    Ran Out of Food in the Last Year: Often true  Transportation Needs: Unmet Transportation Needs (09/05/2024)   PRAPARE - Administrator, Civil Service (Medical): Yes    Lack of Transportation (Non-Medical): Yes  Physical Activity: Not on file  Stress: Not on file  Social Connections: Not on file    Current Medications: Current Facility-Administered Medications  Medication Dose Route Frequency Provider Last Rate Last Admin   acetaminophen  (TYLENOL ) tablet 650 mg  650 mg Oral Q6H PRN Mannie Jerel PARAS, NP       alum & mag hydroxide-simeth (MAALOX/MYLANTA) 200-200-20 MG/5ML suspension 30 mL  30 mL Oral Q4H PRN Mannie Jerel PARAS, NP       ARIPiprazole  (ABILIFY ) tablet 20 mg  20 mg Oral QHS Mannie Jerel PARAS, NP       haloperidol  (HALDOL ) tablet 5 mg  5 mg Oral TID PRN Mannie Jerel PARAS, NP       And    diphenhydrAMINE  (BENADRYL ) capsule 50 mg  50 mg Oral TID PRN Mannie Jerel PARAS, NP       haloperidol  lactate (HALDOL ) injection 10 mg  10 mg Intramuscular TID PRN Mannie Jerel PARAS, NP       And   diphenhydrAMINE  (BENADRYL ) injection 50 mg  50 mg Intramuscular TID PRN Mannie Jerel PARAS, NP       And   LORazepam  (ATIVAN ) injection 2 mg  2 mg Intramuscular TID PRN Mannie Jerel PARAS, NP       haloperidol  lactate (HALDOL ) injection 5 mg  5 mg Intramuscular TID PRN Mannie Jerel PARAS, NP       And   diphenhydrAMINE  (BENADRYL ) injection 50 mg  50 mg Intramuscular TID PRN Mannie Jerel PARAS, NP       And   LORazepam  (ATIVAN ) injection 2 mg  2 mg Intramuscular TID PRN Mannie Jerel PARAS, NP       feeding supplement (ENSURE PLUS HIGH PROTEIN) liquid 237 mL  237 mL Oral BID BM Pashayan, Alexander S, DO   237 mL at 09/06/24 1615   magnesium  hydroxide (MILK OF MAGNESIA) suspension 30 mL  30 mL Oral Daily PRN Mannie Jerel PARAS, NP       mirtazapine  (REMERON ) tablet 15 mg  15 mg Oral QHS Mannie Jerel PARAS, NP       nicotine  (NICODERM CQ  - dosed in mg/24 hours) patch 21 mg  21 mg Transdermal Daily Mannie Jerel PARAS, NP       Oxcarbazepine (TRILEPTAL) tablet 300 mg  300 mg Oral BID Mannie Jerel PARAS, NP       sodium chloride  (OCEAN) 0.65 % nasal spray 1 spray  1 spray Each Nare BID PRN Bobbitt, Shalon E, NP       traZODone  (DESYREL ) tablet 100-200 mg  100-200 mg Oral QHS PRN Mannie Jerel PARAS, NP   200 mg at 09/06/24 2017    Lab Results: No results found for this or any previous visit (from the past 48 hours).  Blood Alcohol level:  Lab Results  Component Value Date   ETH <15 09/04/2024   ETH 26 (H) 08/21/2024    Metabolic Disorder Labs: Lab Results  Component Value Date   HGBA1C 5.8 (H) 12/31/2022   MPG 120 12/31/2022   MPG 111 04/02/2021   No results found for: PROLACTIN Lab Results  Component Value Date   CHOL 149 12/31/2022   TRIG 160 (H) 12/31/2022   HDL 26 (L) 12/31/2022   CHOLHDL 5.7  12/31/2022   VLDL 32 12/31/2022   LDLCALC 91 12/31/2022   LDLCALC 122 (H) 05/04/2021    Physical Findings:  Mental Status exam: Appearance: white male, disheveled, laying under covers with eyes closed  Eye contact: poor - keeps eyes closed throughout  Attitude towards examiner irritable, non-cooperative, answers select questions and remains silent for others.  Psychomotor: lays in bed with limited movements  Speech: reduced amount, sharp and short Language: no delays  Mood: tired Affect: congruent, somnolent  Thought content: does not voice SI or HI (although does not answer), no overt delusions expressed today on limited interview  Thought Process: difficult to gauge based on limited interview, is perseverative - appears at least mildly disorganized  Perception: not answering questions about AVH, not overtly RTIS  Insight: poor - no understanding of why he is in the hospital, Judgement: poor based on recent events.   Orientation: x2 - self and hospital does not answer additional questions  Attention/Concentration: poor due to somnolence and non-compliance  Memory/Cognition: limited ability to assess at this time   Fund of Knowledge: Average   Musculoskeletal: Strength & Muscle Tone: within normal limits Gait & Station: normal Patient leans: N/A Physical Exam Constitutional:      Appearance: Normal appearance.  Pulmonary:     Effort: Pulmonary effort is normal.  Abdominal:     General: There is no distension.  Musculoskeletal:        General: Normal range of motion.  Neurological:     General: No focal deficit present.     Mental Status: He is alert.    Review of Systems  All other systems reviewed and are negative.  Blood pressure 122/80, pulse (!) 102, temperature 98.5 F (36.9 C), temperature source Oral, resp. rate 20, height 5' 10 (1.778 m), weight 90.7 kg, SpO2 100%. Body mass  index is 28.7 kg/m.   Treatment Plan Summary: Daily contact with patient to  assess and evaluate symptoms and progress in treatment   Assessment: ZETHAN ALFIERI is a 48 y.o. male  with a past psychiatric history of bipolar 1 disorder, substance-induced psychosis, stimulant use disorder (methamphetamine, cocaine, alcohol). He has multiple past psychiatric hospitalizations and an ACT team. Patient initially arrived to [facility] on 09/04/24 for hallucinations and paranoid ideation and admitted to [facility] under IVC on 11/14 for acute safety concerns and crisis stabalization.  Patient has a history of bipolar disorder as well as substance-induced psychosis. On this admission he has presented with paranoia, is intermittently responding to internal stimuli, and disorganized in his thought process.   Yesterday he remained paranoid and uncooperative on exam, demonstrating overt psychotic symptoms and IVC was continued. Home medications (abilify  and tegretol ) were continued.   Today the patient was somnolent despite this author coming in in the later morning. Disengaged in interview and irritable - perseverative on getting pain medications and nasal spray. Did not answer additional questions. Based on limited exam he did not overtly appear to be RTIS or expressing delusions. Given excessive sedation and mild improvements in psychotic symptoms without medications (he has been refusing), substances appear to be driving the picture. Will discontinue tegretol  and mirtazapine  at this time (not taking and already somnolent) and focus on monotherapy with abilfiy. Has been switched to morning and 10 mg as he has not taken in several days. If he does not take today and remains with any degree of psychotic symptoms we may have to consider invouluntary medications. However, at this time will continue to offer and monitor. Lidocaine  patch ordered for back and nasal spray available.     Diagnoses / Active Problems: Substance-induced psychosis  Bipolar 1 Disorder Stimulant use disorder    PLAN: Safety and  Monitoring:            --  INVOLUNTARY  admission to inpatient psychiatric unit for safety, stabilization and treatment            -- Daily contact with patient to assess and evaluate symptoms and progress in treatment            -- Patient's case to be discussed in multi-disciplinary team meeting            -- Observation Level : q15 minute checks            -- Vital signs:  q12 hours            -- Precautions: suicide, elopement, and assault   2. Psychiatric Diagnoses and Treatment:   -Decrease abilify  to 10 mg and offer during the day (has not clearly taken in some time, will titrate as indicated/tolerated              --Hold home trileptal 300mg  BID (refusing, stating he does not take at home)            --Hold home remeron  15mg  at bedtime (refusing, excessively sleeping).                           -- Metabolic profile and EKG monitoring obtained while on an atypical antipsychotic  BMI: 28.7 TSH: WNL Most recent QTC (11/14) of 434   3. Medical Issues Being Addressed:  --none, patient has history of rhabdomyolysis and cellulitis   4. Discharge Planning:             --  Social work and case management to assist with discharge planning and identification of hospital follow-up needs prior to discharge            -- Estimated LOS: 5-7 days            -- Discharge Concerns: Need to establish a safety plan; Medication compliance and effectiveness            -- Discharge Goals: Return home with outpatient referrals for mental health follow-up including medication management/psychotherapy      I certify that inpatient services furnished can reasonably be expected to improve the patient's condition.      Leita LOISE Arts, MD 09/07/2024, 8:13 AM

## 2024-09-07 NOTE — Progress Notes (Signed)
   09/07/24 1200  Psych Admission Type (Psych Patients Only)  Admission Status Involuntary  Psychosocial Assessment  Patient Complaints Irritability  Eye Contact Brief  Facial Expression Sad  Affect Irritable  Speech Logical/coherent  Interaction Minimal  Motor Activity Other (Comment) (steady)  Appearance/Hygiene Disheveled  Behavior Characteristics Irritable  Mood Depressed;Irritable  Aggressive Behavior  Effect No apparent injury  Thought Process  Coherency Tangential  Content WDL  Delusions None reported or observed  Perception WDL  Hallucination None reported or observed  Judgment Impaired  Confusion None  Danger to Self  Current suicidal ideation? Denies  Danger to Others  Danger to Others None reported or observed

## 2024-09-07 NOTE — BHH Counselor (Signed)
 The LCSWA made an attempt to complete the PSA with the patient.  The patient waved his hand and stated that he was sleep and for the LCSWA to come back later.  CSW will make another attempt at a later time.   Roselyn Lento, MSW, [facility]  09/06/24

## 2024-09-07 NOTE — BHH Counselor (Signed)
 LCSWA made a second attempt to complete the PSA with the patient at 9:00 am but the patient stated that he was having back pain and was asleep. The patient asked the LCSWA come back later.    The LCSWA  made an third attempt at 1:56 pm to complete the PSA but the patient refused stating that he needed a few more minutes.   The Doctor informed the clinician that he is coming down off of stimulants and that he has been irritable since admission. The LCSWA will make an another attempt at a later time.    Roselyn Lento, MSW, [facility]  09/07/24

## 2024-09-07 NOTE — Group Note (Signed)
 Date:  09/07/2024 Time:  9:21 AM  Group Topic/Focus:  Goals Group:   The focus of this group is to help patients establish daily goals to achieve during treatment and discuss how the patient can incorporate goal setting into their daily lives to aide in recovery.    Participation Level:  Did Not Attend   [name] Pimenta A Kelli Egolf 09/07/2024, 9:21 AM

## 2024-09-07 NOTE — Progress Notes (Signed)
(  Sleep Hours) - 10 hours (Any PRNs that were needed, meds refused, or side effects to meds)-  Trazodone  given, Remeron , Abilify  refused (Any disturbances and when (visitation, over night)- None (Concerns raised by the patient)-  None (SI/HI/AVH)-  Denies

## 2024-09-07 NOTE — BHH Group Notes (Signed)
 Cory Floyd did not attend wrap up group

## 2024-09-07 NOTE — Plan of Care (Signed)
  Problem: Education: Goal: Verbalization of understanding the information provided will improve Outcome: Progressing   Problem: Education: Goal: Emotional status will improve Outcome: Not Progressing

## 2024-09-08 ENCOUNTER — Encounter (HOSPITAL_COMMUNITY): Payer: Self-pay

## 2024-09-08 DIAGNOSIS — F19959 Other psychoactive substance use, unspecified with psychoactive substance-induced psychotic disorder, unspecified: Secondary | ICD-10-CM | POA: Diagnosis not present

## 2024-09-08 DIAGNOSIS — F319 Bipolar disorder, unspecified: Secondary | ICD-10-CM | POA: Diagnosis not present

## 2024-09-08 DIAGNOSIS — F1520 Other stimulant dependence, uncomplicated: Secondary | ICD-10-CM | POA: Diagnosis not present

## 2024-09-08 MED ORDER — DIVALPROEX SODIUM ER 500 MG PO TB24
1000.0000 mg | ORAL_TABLET | Freq: Every day | ORAL | Status: DC
  Administered 2024-09-11 – 2024-09-17 (×6): 1000 mg via ORAL
  Filled 2024-09-08 (×4): qty 2
  Filled 2024-09-08: qty 14
  Filled 2024-09-08 (×6): qty 2

## 2024-09-08 NOTE — Plan of Care (Signed)
   Problem: Coping: Goal: Ability to verbalize frustrations and anger appropriately will improve Outcome: Progressing   Problem: Safety: Goal: Periods of time without injury will increase Outcome: Progressing

## 2024-09-08 NOTE — Group Note (Signed)
 Recreation Therapy Group Note   Group Topic:Communication  Group Date: 09/08/2024 Start Time: 0945 End Time: 1020 Facilitators: Zayli Villafuerte-McCall, LRT,CTRS Location: [address] Dayroom   Group Topic: Communication, Problem Solving   Goal Area(s) Addresses:  Patient will effectively listen to complete activity.  Patient will identify communication skills used to make activity successful.  Patient will identify how skills used during activity can be used to reach post d/c goals.    Behavioral Response:    Intervention: Building Surveyor Activity - Geometric pattern cards, pencils, blank paper    Activity: Geometric Drawings.  Three volunteers from the peer group will be shown an abstract picture with a particular arrangement of geometrical shapes.  Each round, one 'speaker' will describe the pattern, as accurately as possible without revealing the image to the group.  The remaining group members will listen and draw the picture to reflect how it is described to them. Patients with the role of 'listener' cannot ask clarifying questions but, may request that the speaker repeat a direction. Once the drawings are complete, the presenter will show the rest of the group the picture and compare how close each person came to drawing the picture. LRT will facilitate a post-activity discussion regarding effective communication and the importance of planning, listening, and asking for clarification in daily interactions with others.  Education: Environmental consultant, Active listening, Support systems, Discharge planning  Education Outcome: Acknowledges understanding/In group clarification offered/Needs additional education.    Affect/Mood: N/A   Participation Level: Did not attend    Clinical Observations/Individualized Feedback:     Plan: Continue to engage patient in RT group sessions 2-3x/week.   Manuela Halbur-McCall, LRT,CTRS 09/08/2024 1:24 PM

## 2024-09-08 NOTE — Group Note (Signed)
 Date:  09/08/2024 Time:  2:05 PM  Group Topic/Focus: Chaplin Spirituality:   The focus of this group is to discuss how one's spirituality can aide in recovery.    Participation Level:  Did Not Attend   Dolores CHRISTELLA Fredericks 09/08/2024, 2:05 PM

## 2024-09-08 NOTE — Group Note (Signed)
 Date:  09/08/2024 Time:  1:38 PM  Group Topic/Focus: Physical wellness Physical wellness and mental health are deeply connected. When you take care of your body, you support your brain's ability to manage stress, regulate emotions, and function more effectively. Here's how physical wellness boosts mental well-being--and how to start improving both:      Participation Level:  Did Not Attend   Cory Floyd 09/08/2024, 1:38 PM

## 2024-09-08 NOTE — Progress Notes (Signed)
 MHT went to patient room to ask him about lunch and he responded to her saying he didn't want to go and a plate was offered and he said No. Patient then 45 minutes came to nursing station complaining about no one woke him. Patient would not come to medication room for meds or treatment team.

## 2024-09-08 NOTE — Group Note (Signed)
 Date:  09/08/2024 Time:  11:01 AM  Group Topic/Focus: Group Topic/Focus: Recreational Therapy  Patients played blind picturing  where the speaker describes the geometrical image in detail then the drawer has to recreate the image of the picture.      Participation Level:  Did Not Attend   Cory Floyd 09/08/2024, 11:01 AM

## 2024-09-08 NOTE — BH IP Treatment Plan (Signed)
 Interdisciplinary Treatment and Diagnostic Plan Update  09/08/2024 Time of Session: 10:15 AM Cory Floyd MRN: [id number]  Principal Diagnosis: Psychoactive substance-induced psychosis (HCC)  Secondary Diagnoses: Principal Problem:   Psychoactive substance-induced psychosis (HCC) Active Problems:   Bipolar affective disorder, current episode manic (HCC)   Severe stimulant use disorder (HCC)   Current Medications:  Current Facility-Administered Medications  Medication Dose Route Frequency Provider Last Rate Last Admin   acetaminophen  (TYLENOL ) tablet 650 mg  650 mg Oral Q6H PRN Mannie Jerel PARAS, NP   650 mg at 09/07/24 0832   alum & mag hydroxide-simeth (MAALOX/MYLANTA) 200-200-20 MG/5ML suspension 30 mL  30 mL Oral Q4H PRN Mannie Jerel PARAS, NP       ARIPiprazole  (ABILIFY ) tablet 10 mg  10 mg Oral Daily Towana Leita SAILOR, MD       haloperidol  (HALDOL ) tablet 5 mg  5 mg Oral TID PRN Mannie Jerel PARAS, NP       And   diphenhydrAMINE  (BENADRYL ) capsule 50 mg  50 mg Oral TID PRN Mannie Jerel PARAS, NP       haloperidol  lactate (HALDOL ) injection 10 mg  10 mg Intramuscular TID PRN Mannie Jerel PARAS, NP       And   diphenhydrAMINE  (BENADRYL ) injection 50 mg  50 mg Intramuscular TID PRN Mannie Jerel PARAS, NP       And   LORazepam  (ATIVAN ) injection 2 mg  2 mg Intramuscular TID PRN Mannie Jerel PARAS, NP       haloperidol  lactate (HALDOL ) injection 5 mg  5 mg Intramuscular TID PRN Mannie Jerel PARAS, NP       And   diphenhydrAMINE  (BENADRYL ) injection 50 mg  50 mg Intramuscular TID PRN Mannie Jerel PARAS, NP       And   LORazepam  (ATIVAN ) injection 2 mg  2 mg Intramuscular TID PRN Mannie Jerel PARAS, NP       divalproex  (DEPAKOTE  ER) 24 hr tablet 1,000 mg  1,000 mg Oral QHS Towana Leita SAILOR, MD       feeding supplement (ENSURE PLUS HIGH PROTEIN) liquid 237 mL  237 mL Oral BID BM Pashayan, Alexander S, DO   237 mL at 09/08/24 1028   lidocaine  (LIDODERM ) 5 % 1 patch  1 patch Transdermal Daily Towana Leita SAILOR, MD   1 patch at 09/07/24 1149   magnesium  hydroxide (MILK OF MAGNESIA) suspension 30 mL  30 mL Oral Daily PRN Mannie Jerel PARAS, NP       nicotine  (NICODERM CQ  - dosed in mg/24 hours) patch 21 mg  21 mg Transdermal Daily Mannie Jerel PARAS, NP       sodium chloride  (OCEAN) 0.65 % nasal spray 1 spray  1 spray Each Nare BID PRN Bobbitt, Shalon E, NP   1 spray at 09/07/24 1706   traZODone  (DESYREL ) tablet 100-200 mg  100-200 mg Oral QHS PRN Mannie Jerel PARAS, NP   200 mg at 09/07/24 2104   PTA Medications: Medications Prior to Admission  Medication Sig Dispense Refill Last Dose/Taking   acetaminophen  (TYLENOL ) 500 MG tablet Take 500-1,500 mg by mouth every 6 (six) hours as needed for moderate pain (pain score 4-6) (pt states he has rheumatoid arthritis).      ARIPiprazole  (ABILIFY ) 20 MG tablet Take 20 mg by mouth at bedtime.      aspirin-acetaminophen -caffeine (EXCEDRIN EXTRA STRENGTH) 250-250-65 MG tablet Take 2 tablets by mouth every 6 (six) hours as needed for headache.      ibuprofen  (ADVIL )  200 MG tablet Take 400 mg by mouth every 6 (six) hours as needed for moderate pain (pain score 4-6).      lidocaine  (XYLOCAINE ) 5 % ointment Apply 1 Application topically as needed. 35.44 g 0    mirtazapine  (REMERON ) 15 MG tablet Take 15 mg by mouth at bedtime.      naproxen  sodium (ALEVE ) 220 MG tablet Take 440 mg by mouth 2 (two) times daily as needed (rheumatoid arthritis per patient).      Oxcarbazepine (TRILEPTAL) 300 MG tablet Take 300 mg by mouth 2 (two) times daily.      traZODone  (DESYREL ) 100 MG tablet Take 100-200 mg by mouth at bedtime as needed for sleep. Take one-two tablets (100mg -200mg ) by mouth at bedtime as needed.       Patient Stressors: Financial difficulties   Medication change or noncompliance   Substance abuse    Patient Strengths: Capable of independent living   Treatment Modalities: Medication Management, Group therapy, Case management,  1 to 1 session with clinician,  Psychoeducation, Recreational therapy.   Physician Treatment Plan for Primary Diagnosis: Psychoactive substance-induced psychosis (HCC) Long Term Goal(s): Improvement in symptoms so as ready for discharge   Short Term Goals: Ability to identify changes in lifestyle to reduce recurrence of condition will improve Ability to verbalize feelings will improve Ability to demonstrate self-control will improve Ability to identify and develop effective coping behaviors will improve Ability to maintain clinical measurements within normal limits will improve Compliance with prescribed medications will improve Ability to identify triggers associated with substance abuse/mental health issues will improve  Medication Management: Evaluate patient's response, side effects, and tolerance of medication regimen.  Therapeutic Interventions: 1 to 1 sessions, Unit Group sessions and Medication administration.  Evaluation of Outcomes: Not Progressing  Physician Treatment Plan for Secondary Diagnosis: Principal Problem:   Psychoactive substance-induced psychosis (HCC) Active Problems:   Bipolar affective disorder, current episode manic (HCC)   Severe stimulant use disorder (HCC)  Long Term Goal(s): Improvement in symptoms so as ready for discharge   Short Term Goals: Ability to identify changes in lifestyle to reduce recurrence of condition will improve Ability to verbalize feelings will improve Ability to demonstrate self-control will improve Ability to identify and develop effective coping behaviors will improve Ability to maintain clinical measurements within normal limits will improve Compliance with prescribed medications will improve Ability to identify triggers associated with substance abuse/mental health issues will improve     Medication Management: Evaluate patient's response, side effects, and tolerance of medication regimen.  Therapeutic Interventions: 1 to 1 sessions, Unit Group sessions  and Medication administration.  Evaluation of Outcomes: Not Progressing   RN Treatment Plan for Primary Diagnosis: Psychoactive substance-induced psychosis (HCC) Long Term Goal(s): Knowledge of disease and therapeutic regimen to maintain health will improve  Short Term Goals: Ability to remain free from injury will improve, Ability to verbalize frustration and anger appropriately will improve, Ability to demonstrate self-control, Ability to participate in decision making will improve, Ability to verbalize feelings will improve, Ability to disclose and discuss suicidal ideas, Ability to identify and develop effective coping behaviors will improve, and Compliance with prescribed medications will improve  Medication Management: RN will administer medications as ordered by provider, will assess and evaluate patient's response and provide education to patient for prescribed medication. RN will report any adverse and/or side effects to prescribing provider.  Therapeutic Interventions: 1 on 1 counseling sessions, Psychoeducation, Medication administration, Evaluate responses to treatment, Monitor vital signs and CBGs as ordered, Perform/monitor  CIWA, COWS, AIMS and Fall Risk screenings as ordered, Perform wound care treatments as ordered.  Evaluation of Outcomes: Not Progressing   LCSW Treatment Plan for Primary Diagnosis: Psychoactive substance-induced psychosis (HCC) Long Term Goal(s): Safe transition to appropriate next level of care at discharge, Engage patient in therapeutic group addressing interpersonal concerns.  Short Term Goals: Engage patient in aftercare planning with referrals and resources, Increase social support, Increase ability to appropriately verbalize feelings, Increase emotional regulation, Facilitate acceptance of mental health diagnosis and concerns, Facilitate patient progression through stages of change regarding substance use diagnoses and concerns, Identify triggers  associated with mental health/substance abuse issues, and Increase skills for wellness and recovery  Therapeutic Interventions: Assess for all discharge needs, 1 to 1 time with Social worker, Explore available resources and support systems, Assess for adequacy in community support network, Educate family and significant other(s) on suicide prevention, Complete Psychosocial Assessment, Interpersonal group therapy.  Evaluation of Outcomes: Not Progressing   Progress in Treatment: Attending groups: No. Participating in groups: No. Taking medication as prescribed: No. Toleration medication: N/A, patient is refusing medications.  Family/Significant other contact made: No, will contact:  consents pending.  Patient understands diagnosis: No. Discussing patient identified problems/goals with staff: No. Medical problems stabilized or resolved: Yes. Denies suicidal/homicidal ideation: Yes. Issues/concerns per patient self-inventory: No. None reported.  New problem(s) identified: No, Describe:  None identified.  New Short Term/Long Term Goal(s): detox, medication management for mood stabilization; elimination of SI thoughts; development of comprehensive mental wellness/sobriety plan   Patient Goals: Patient refused to participate.   Discharge Plan or Barriers: Patient recently admitted. CSW will continue to follow and assess for appropriate referrals and possible discharge planning.    Reason for Continuation of Hospitalization: Delusions  Mania Medication stabilization Withdrawal symptoms  Estimated Length of Stay: 5-7 days.  Last 3 [place] Suicide Severity Risk Score: Flowsheet Row Admission (Current) [facility] from 09[facility]04/2024 in [facility] Department at [facility] ED from 08/21/2024 in [facility] Emergency Department at [facility]  C-SSRS RISK CATEGORY No Risk No Risk No Risk    Last [facility] 2/9  Scores:     No data to display          Scribe for Treatment Team: Teyana Pierron  Nunez-Uva, LCSWA 09/08/2024 1:51 PM

## 2024-09-08 NOTE — Progress Notes (Signed)
(  Sleep Hours) - 7 (Any PRNs that were needed, meds refused, or side effects to meds)- Trazodone  (Any disturbances and when (visitation, over night)- n/a (Concerns raised by the patient)- c/o back pain (SI/HI/AVH)- denies

## 2024-09-08 NOTE — Plan of Care (Signed)
 Pt was in bed all day and refusing all care/medications/VS checks/therapy. He didn't go to the cafeteria nor eat his meals but did get up for snack time.  Denies SI/HI/SH/paranoia/AVH. Will continue to monitor.

## 2024-09-08 NOTE — Group Note (Signed)
 Date:  09/08/2024 Time:  10:16 AM  Group Topic/Focus: Goals group orientation Goals Group:   The focus of this group is to help patients establish daily goals to achieve during treatment and discuss how the patient can incorporate goal setting into their daily lives to aide in recovery.    Participation Level:  Did Not Attend   Cory Floyd 09/08/2024, 10:16 AM

## 2024-09-08 NOTE — BHH Counselor (Signed)
 Adult Comprehensive Assessment  Patient ID: LORAIN KEAST, male   DOB: 10-Jan-1976, 48 y.o.   MRN: [id number]  4th attempt on PSA this morning. Pt refused stating I don't need to answer your questions, I went to the hospital for physical pain then they sent me here. Pt on day 3, PSA summary completed as noted below.    Summary/Recommendations:   Summary and Recommendations (to be completed by the evaluator): Sammy Cassar is a 48yo male who is involuntarily admitted to [facility] secondary to [facility] due to paranoia and hallucinations. Per chart review, pt has psychosocial stressors including homelessness, legal concerns, and substane use. Pt did state he missed a court date and needs to see the judge in [facility]. Pt was [place] for bizarre behavior, stating he saw people from his past who were laughing at him. Appears to be a poor historian and uncooperative with assessment per H&P. UDS positive for amphetamines and cocaine, pt did endorse drinking alcohol prior to going to the bank. Pt has 2 children, no realtionship with them. Appears pt has had an ACT team in the past, unsure if still active. While here, Sevon can benefit from crisis stabilization, medication management, therapeutic milieu, and referrals for services.   Jenkins LULLA Primer. 09/08/2024

## 2024-09-08 NOTE — Group Note (Signed)
 Date:  09/08/2024 Time:  11:27 AM  Group Topic/Focus: Emotional wellness Emotional Education:   The focus of this group is to discuss what feelings/emotions are, and how they are experienced.    Participation Level:  Did Not Attend   Cory Floyd 09/08/2024, 11:27 AM

## 2024-09-08 NOTE — Progress Notes (Signed)
 [facility] MD Progress Note  09/08/2024 8:14 AM Cory Floyd  MRN:  [id number]  Principal Problem: Psychoactive substance-induced psychosis (HCC) Diagnosis: Principal Problem:   Psychoactive substance-induced psychosis (HCC) Active Problems:   Bipolar affective disorder, current episode manic (HCC)   Severe stimulant use disorder (HCC)  Total Time spent with patient: 35 minutes  Identify Information and reason for admission The patient is a 48 y.o. male with a psychiatric history of underlying bipolar 1 disorder as well as substance-induced psychosis and severe stimulant use disorder. He presents on this admission with manic and psychotic symptoms in the community (bizarre behavior at the bank) in the context of substance use - admitted under IVC.  Interval events: Per chart review the patient remains minimally engaged with care, has been irritable and in bed, not attending groups and sleeping much of the day. Has continued to refuse all psychotropics aside from trazodone , however overall logical on brief interactions. Slept 7 hrs overnight. BP 120/80 (BP Location: Left Arm)   Pulse 95   Temp 97.6 F (36.4 C) (Oral)   Resp 20   Ht 5' 10 (1.778 m)   Wt 90.7 kg   SpO2 100%   BMI 28.70 kg/m    Interview today:  Today the patient states he is tired and my back hurts. Denies taking anything specific for this at home. When asked about medications today, the patient states he used to be on the shot through [facility]. However, reports it has been several months since he got the shot and they dropped me for no reason. He does also identify depakote  as a medication that he was taking the past and believes it helped him. He does not volunteer additional information this morning except to request pain medication. Does agree that next time he is offered abilify  he will take it and is open to getting back on injection. No SI, HI or AVH reported today.    Past Psychiatric Hx: Current  Psychiatrist: none, was previously seen by ACT team but dropped due to medication noncompliance related to loss of provider  Current Therapist: none reported in chart review  Previous Psychiatric Diagnoses: Bipolar I disorder, substance-induced psychosis, cocaine abuse with cocaine-induced mood disorder Current psychiatric medications: trileptal 300 BID, remeron  15, abilify  20mg  at bedtime  Psychiatric medication history/compliance: history of medication non-compliance. Past medications include depakote , this was discontinued in the setting of medication non-compliance. Psychiatric Hospitalization hx: [facility] 12/2022 (SI, depression), 04/2021 (SI), 03/2021 (SI with plan to shoot self with gun), 01/2021 (aggressive behaviors and SI/HI threats) Psychotherapy hx: unknown Neuromodulation history: none History of suicide: yes, via IV drug use History of homicide or aggression (obtained in HPI): yes, 01/2021 hospitalization   Substance Abuse Hx: (Frequency, quantity, last use, impact) UDS+cocaine, amphetamines.  Patient has a history of IVDU.  Per chart review, patient has past history of stimulant use disorder (meth, cocaine) and tobacco use   Past Medical History:  Past Medical History:  Diagnosis Date   Alcoholic (HCC)    clean for 3 years   Anginal pain    Arthritis    Bipolar 1 disorder (HCC)    COVID-19    Depressed    Diabetes mellitus without complication (HCC)    Dyspnea    Fatty liver    Hypertension    Schizophrenia (HCC)     Past Surgical History:  Procedure Laterality Date   COLONOSCOPY  02/05/2014   diverticulosis, hyperplastic polyp x 2   fracture arm Right  INCISION AND DRAINAGE ABSCESS Bilateral 12/29/2022   Procedure: INCISION AND DRAINAGE ABSCESS BILATERAL ARMS;  Surgeon: Kendal Franky SQUIBB, MD;  Location: [facility] OR;  Service: Orthopedics;  Laterality: Bilateral;   KNEE SURGERY Right 2013   NASAL SEPTUM SURGERY     NASAL TURBINATE REDUCTION Bilateral 12/12/2016   Procedure:  TURBINATE REDUCTION/SUBMUCOSAL RESECTION;  Surgeon: Chinita Hasten, MD;  Location: [facility] ORS;  Service: ENT;  Laterality: Bilateral;   Family History:  Family History  Problem Relation Age of Onset   Hepatitis C Mother    Cancer Mother    Cancer Maternal Aunt    Hypertension Maternal Grandmother     Social History:  Social History   Substance and Sexual Activity  Alcohol Use Yes   Comment: 1/2-5th pint of liquor     Social History   Substance and Sexual Activity  Drug Use Yes   Types: Cocaine, Marijuana   Comment: heroin    Social History   Socioeconomic History   Marital status: Divorced    Spouse name: Not on file   Number of children: Not on file   Years of education: Not on file   Highest education level: Not on file  Occupational History   Not on file  Tobacco Use   Smoking status: Every Day    Current packs/day: 1.00    Average packs/day: 1 pack/day for 20.0 years (20.0 ttl pk-yrs)    Types: Cigarettes   Smokeless tobacco: Never  Vaping Use   Vaping status: Never Used  Substance and Sexual Activity   Alcohol use: Yes    Comment: 1/2-5th pint of liquor   Drug use: Yes    Types: Cocaine, Marijuana    Comment: heroin   Sexual activity: Yes    Birth control/protection: None  Other Topics Concern   Not on file  Social History Narrative   Not on file   Social Drivers of Health   Financial Resource Strain: Not on file  Food Insecurity: Food Insecurity Present (09/05/2024)   Hunger Vital Sign    Worried About Running Out of Food in the Last Year: Often true    Ran Out of Food in the Last Year: Often true  Transportation Needs: Unmet Transportation Needs (09/05/2024)   PRAPARE - Administrator, Civil Service (Medical): Yes    Lack of Transportation (Non-Medical): Yes  Physical Activity: Not on file  Stress: Not on file  Social Connections: Not on file    Current Medications: Current Facility-Administered Medications  Medication Dose  Route Frequency Provider Last Rate Last Admin   acetaminophen  (TYLENOL ) tablet 650 mg  650 mg Oral Q6H PRN Mannie Jerel PARAS, NP   650 mg at 09/07/24 0832   alum & mag hydroxide-simeth (MAALOX/MYLANTA) 200-200-20 MG/5ML suspension 30 mL  30 mL Oral Q4H PRN Mannie Jerel PARAS, NP       ARIPiprazole  (ABILIFY ) tablet 10 mg  10 mg Oral Daily Towana Leita SAILOR, MD       haloperidol  (HALDOL ) tablet 5 mg  5 mg Oral TID PRN Mannie Jerel PARAS, NP       And   diphenhydrAMINE  (BENADRYL ) capsule 50 mg  50 mg Oral TID PRN Mannie Jerel PARAS, NP       haloperidol  lactate (HALDOL ) injection 10 mg  10 mg Intramuscular TID PRN Mannie Jerel PARAS, NP       And   diphenhydrAMINE  (BENADRYL ) injection 50 mg  50 mg Intramuscular TID PRN Mannie Jerel PARAS, NP  And   LORazepam  (ATIVAN ) injection 2 mg  2 mg Intramuscular TID PRN Mannie Jerel PARAS, NP       haloperidol  lactate (HALDOL ) injection 5 mg  5 mg Intramuscular TID PRN Mannie Jerel PARAS, NP       And   diphenhydrAMINE  (BENADRYL ) injection 50 mg  50 mg Intramuscular TID PRN Mannie Jerel PARAS, NP       And   LORazepam  (ATIVAN ) injection 2 mg  2 mg Intramuscular TID PRN Mannie Jerel PARAS, NP       feeding supplement (ENSURE PLUS HIGH PROTEIN) liquid 237 mL  237 mL Oral BID BM Pashayan, Alexander S, DO   237 mL at 09/07/24 2104   lidocaine  (LIDODERM ) 5 % 1 patch  1 patch Transdermal Daily Towana Leita SAILOR, MD   1 patch at 09/07/24 1149   magnesium  hydroxide (MILK OF MAGNESIA) suspension 30 mL  30 mL Oral Daily PRN Mannie Jerel PARAS, NP       nicotine  (NICODERM CQ  - dosed in mg/24 hours) patch 21 mg  21 mg Transdermal Daily Mannie Jerel PARAS, NP       sodium chloride  (OCEAN) 0.65 % nasal spray 1 spray  1 spray Each Nare BID PRN Bobbitt, Shalon E, NP   1 spray at 09/07/24 1706   traZODone  (DESYREL ) tablet 100-200 mg  100-200 mg Oral QHS PRN Mannie Jerel PARAS, NP   200 mg at 09/07/24 2104    Lab Results: No results found for this or any previous visit (from the past 48  hours).  Blood Alcohol level:  Lab Results  Component Value Date   ETH <15 09/04/2024   ETH 26 (H) 08/21/2024    Metabolic Disorder Labs: Lab Results  Component Value Date   HGBA1C 5.8 (H) 12/31/2022   MPG 120 12/31/2022   MPG 111 04/02/2021   No results found for: PROLACTIN Lab Results  Component Value Date   CHOL 149 12/31/2022   TRIG 160 (H) 12/31/2022   HDL 26 (L) 12/31/2022   CHOLHDL 5.7 12/31/2022   VLDL 32 12/31/2022   LDLCALC 91 12/31/2022   LDLCALC 122 (H) 05/04/2021    Physical Findings:  Mental Status exam: Appearance: white male, disheveled, laying under covers with eyes closed  Eye contact: poor - keeps eyes closed throughout  Attitude towards examiner remains irritable and reduced engagement in exam, however does answer some questions this morning  Psychomotor: lays in bed with limited movements  Speech: reduced amount, sharp and short Language: no delays  Mood: tired and my back hurts Affect: congruent, somnolent  Thought content: denies SI and HI no overt delusions expressed today  Thought Process: largely linear and organized  Perception: denying AVH, not overtly RTIS  Insight: poor - no understanding of why he is in the hospital Judgement: poor based on recent events.   Orientation: x2 - self and hospital does not answer additional questions  Attention/Concentration: poor due to somnolence and non-compliance  Memory/Cognition: limited ability to assess at this time   Fund of Knowledge: Average   Musculoskeletal: Strength & Muscle Tone: within normal limits Gait & Station: normal Patient leans: N/A   Physical Exam Constitutional:      Appearance: Normal appearance.  Pulmonary:     Effort: Pulmonary effort is normal.  Abdominal:     General: There is no distension.  Musculoskeletal:        General: Normal range of motion.  Neurological:     General: No focal deficit present.  Mental Status: He is alert.    Review of  Systems  All other systems reviewed and are negative.  Blood pressure 120/80, pulse 95, temperature 97.6 F (36.4 C), temperature source Oral, resp. rate 20, height 5' 10 (1.778 m), weight 90.7 kg, SpO2 100%. Body mass index is 28.7 kg/m.   Treatment Plan Summary: Daily contact with patient to assess and evaluate symptoms and progress in treatment   Assessment: AMBERS IYENGAR is a 48 y.o. male  with a past psychiatric history of bipolar 1 disorder, substance-induced psychosis, stimulant use disorder (methamphetamine, cocaine, alcohol). He has multiple past psychiatric hospitalizations and an ACT team. Patient initially arrived to [facility] on 09/04/24 for hallucinations and paranoid ideation and admitted to [facility] under IVC on 11/14 for acute safety concerns and crisis stabalization.  Patient has a history of bipolar disorder as well as substance-induced psychosis. On this admission he has presented with paranoia, was intermittently responding to internal stimuli, and disorganized in his thought process. Upon arrival, he remained paranoid and uncooperative on exam, demonstrating overt psychotic symptoms and IVC was continued. Home medications (abilify , mirtazapine  and tegretol ) were continued.   Yesterday the patient was somnolent and did not engage in interview (or with other staff) despite multiple attempts made. Irritable, somnolent and perseverative on getting pain medications and nasal spray. Did not answer additional questions except to deny taking any of the medications ordered here as home medications. Based on limited exam he did not overtly appear to be RTIS or expressing delusions. Given excessive sedation and mild improvements in psychotic symptoms without medications (he has been refusing), substances appeared to be driving the picture. As he denied taking any outpatient medications, tegretol  and mirtazapine  were discontinued. Abilify  decreased to 10 mg, however he has yet to take this  medication.   Today the patient remained somnolent and irritable but did provide more information than on prior interviews. Endorsed being several months off of his injection through [facility]. Reported LAI (unclear which, but appears to be abilify ) and depakote  as his main medications. After brief discussion patient voiced intent to try the abilify  tomorrow when offered and to take depakote  tonight. Will order depakote  ER 1000 mg at bedtime tonight. Remained with significant fatigue/somnolence but otherwise was not presenting with overt psychotic symptoms today.     Diagnoses / Active Problems: Substance-induced psychosis  Bipolar 1 Disorder Stimulant use disorder    PLAN: Safety and Monitoring:            --  INVOLUNTARY  admission to inpatient psychiatric unit for safety, stabilization and treatment            -- Daily contact with patient to assess and evaluate symptoms and progress in treatment            -- Patient's case to be discussed in multi-disciplinary team meeting            -- Observation Level : q15 minute checks            -- Vital signs:  q12 hours            -- Precautions: suicide, elopement, and assault   2. Psychiatric Diagnoses and Treatment:   -Decrease abilify  to 10 mg and offer during the day (has not clearly taken in some time, will titrate as indicated/tolerated              --Hold home trileptal 300mg  BID (refusing, stating he does not take at home)            --  Hold home remeron  15mg  at bedtime (refusing, excessively sleeping).                           -- Metabolic profile and EKG monitoring obtained while on an atypical antipsychotic  BMI: 28.7 TSH: WNL Most recent QTC (11/14) of 434   3. Medical Issues Being Addressed:  --none, patient has history of rhabdomyolysis and cellulitis   4. Discharge Planning:             -- Social work and case management to assist with discharge planning and identification of hospital follow-up needs prior to  discharge            -- Estimated LOS: 5-7 days            -- Discharge Concerns: Need to establish a safety plan; Medication compliance and effectiveness            -- Discharge Goals: Return home with outpatient referrals for mental health follow-up including medication management/psychotherapy      I certify that inpatient services furnished can reasonably be expected to improve the patient's condition.      Leita LOISE Arts, MD 09/08/2024, 8:14 AM

## 2024-09-09 NOTE — Plan of Care (Signed)
  Problem: Coping: Goal: Ability to verbalize frustrations and anger appropriately will improve Outcome: Progressing   Problem: Activity: Goal: Interest or engagement in activities will improve Outcome: Progressing   Problem: Physical Regulation: Goal: Ability to maintain clinical measurements within normal limits will improve Outcome: Progressing

## 2024-09-09 NOTE — [facility] Group Notes (Deleted)
 Psychoeducational Group Note  Date:  09/09/2024 Time:  2111  Group Topic/Focus:  Wrap-Up Group:   The focus of this group is to help patients review their daily goal of treatment and discuss progress on daily workbooks.  Participation Level: Did Not Attend  Participation Quality:  Not Applicable  Affect:  Not Applicable  Cognitive:  Not Applicable  Insight:  Not Applicable  Engagement in Group: Not Applicable  Additional Comments:  The patient did not attend group this evening.   Charie Pinkus S 09/09/2024, 9:11 PM

## 2024-09-09 NOTE — Group Note (Signed)
 Date:  09/09/2024 Time:  12:17 PM  Group Topic/Focus: Social work  Social work helps clients with low confidence by assessing challenges, empowering them through skills and support, fostering self-esteem, and helping them navigate social and printmaker. Confidence is both a goal and a tool for mental health recovery.    Participation Level:  Did Not Attend   Dolores CHRISTELLA Fredericks 09/09/2024, 12:17 PM

## 2024-09-09 NOTE — Group Note (Signed)
 LCSW Group Therapy Note   Group Date: 09/09/2024 Start Time: 1100 End Time: 1200   Participation:  did not attend  Type of Therapy:  Group Therapy  Topic:  Shining from Within:  Confidence and Self-Love Journey   Objective:  To support participants in developing confidence and self-love through self-awareness, self-compassion, and practical skills that nurture personal growth.   Group Goals Encourage self-reflection and self-acceptance by identifying personal strengths and achievements. Teach skills to challenge negative self-talk and replace it with supportive, truthful self-talk. Foster resilience and self-worth through owens & minor, gratitude, and self-care practices.   Summary:  This group explores the connection between confidence and self-love by guiding participants through reflection, mindset shifts, and practical tools like affirmations, strength recognition, and goal-setting. Activities are designed to promote self-compassion, build emotional resilience, and normalize the slow, patient journey of inner growth.   Therapeutic Modalities Used Cognitive Behavioral Therapy (CBT): Challenging and reframing unhelpful self-talk. Motivational Interviewing (MI): Encouraging small, achievable goals. Elements of Dialectical Behavioral Therapist (DBT):  Mindfulness and Self-Compassion: Promoting present-moment awareness and kindness toward self.   Bryn Saline O Cory Floyd, LCSWA 09/09/2024  12:26 PM

## 2024-09-09 NOTE — Progress Notes (Signed)
 [facility] MD Progress Note  09/09/2024 4:28 PM Cory Floyd  MRN:  [id number] Subjective:   Cory Floyd is a 48 y.o. male  with a past psychiatric history of bipolar 1 disorder, substance-induced psychosis, stimulant use disorder (methamphetamine, cocaine, alcohol). He has multiple past psychiatric hospitalizations and an ACT team. Patient initially arrived to [facility] on 09/04/24 for hallucinations and paranoid ideation and admitted to [facility] under IVC on 11/14 for acute safety concerns and crisis stabalization.  Patient has a history of bipolar disorder as well as substance-induced psychosis.    Case was discussed in the multidisciplinary team. MAR was reviewed and patient was not compliant with medications.  He received PRN Trazodone  last night.   Psychiatric Team made the following recommendations yesterday: -Decrease abilify  to 10 mg and offer during the day (has not clearly taken in some time, will titrate as indicated/tolerated  --Hold home trileptal 300mg  BID (refusing, stating he does not take at home) --Hold home remeron  15mg  at bedtime (refusing, excessively sleeping). -Start Depakote  1000 mg QHS for mood stability   On interview today patient reports he slept good last night.  He reports his appetite is doing good.  He reports no SI, HI, or AVH.  He reports no Paranoia or Ideas of Reference.   When ask why he did not take his medications this morning he did not answer.  Encouraged him to take his medications.  Encouraged him to attend groups.  He reports no other concerns at present.   Principal Problem: Psychoactive substance-induced psychosis (HCC) Diagnosis: Principal Problem:   Psychoactive substance-induced psychosis (HCC) Active Problems:   Bipolar affective disorder, current episode manic (HCC)   Severe stimulant use disorder (HCC)  Total Time spent with patient:  I personally spent 35 minutes on the unit in direct patient care. The direct patient care time included  face-to-face time with the patient, reviewing the patient's chart, communicating with other professionals, and coordinating care.    Past Psychiatric History:  bipolar 1 disorder, substance-induced psychosis, stimulant use disorder (methamphetamine, cocaine, alcohol). He has multiple past psychiatric hospitalizations and an ACT team.  Past Medical History:  Past Medical History:  Diagnosis Date   Alcoholic (HCC)    clean for 3 years   Anginal pain    Arthritis    Bipolar 1 disorder (HCC)    COVID-19    Depressed    Diabetes mellitus without complication (HCC)    Dyspnea    Fatty liver    Hypertension    Schizophrenia (HCC)     Past Surgical History:  Procedure Laterality Date   COLONOSCOPY  02/05/2014   diverticulosis, hyperplastic polyp x 2   fracture arm Right    INCISION AND DRAINAGE ABSCESS Bilateral 12/29/2022   Procedure: INCISION AND DRAINAGE ABSCESS BILATERAL ARMS;  Surgeon: Kendal Franky SQUIBB, MD;  Location: [facility] OR;  Service: Orthopedics;  Laterality: Bilateral;   KNEE SURGERY Right 2013   NASAL SEPTUM SURGERY     NASAL TURBINATE REDUCTION Bilateral 12/12/2016   Procedure: TURBINATE REDUCTION/SUBMUCOSAL RESECTION;  Surgeon: Chinita Hasten, MD;  Location: [facility] ORS;  Service: ENT;  Laterality: Bilateral;   Family History:  Family History  Problem Relation Age of Onset   Hepatitis C Mother    Cancer Mother    Cancer Maternal Aunt    Hypertension Maternal Grandmother    Family Psychiatric  History:  uncle and aunt who completed suicide   Social History:  Social History   Substance and Sexual Activity  Alcohol  Use Yes   Comment: 1/2-5th pint of liquor     Social History   Substance and Sexual Activity  Drug Use Yes   Types: Cocaine, Marijuana   Comment: heroin    Social History   Socioeconomic History   Marital status: Divorced    Spouse name: Not on file   Number of children: Not on file   Years of education: Not on file   Highest education level: Not  on file  Occupational History   Not on file  Tobacco Use   Smoking status: Every Day    Current packs/day: 1.00    Average packs/day: 1 pack/day for 20.0 years (20.0 ttl pk-yrs)    Types: Cigarettes   Smokeless tobacco: Never  Vaping Use   Vaping status: Never Used  Substance and Sexual Activity   Alcohol use: Yes    Comment: 1/2-5th pint of liquor   Drug use: Yes    Types: Cocaine, Marijuana    Comment: heroin   Sexual activity: Yes    Birth control/protection: None  Other Topics Concern   Not on file  Social History Narrative   Not on file   Social Drivers of Health   Financial Resource Strain: Not on file  Food Insecurity: Food Insecurity Present (09/05/2024)   Hunger Vital Sign    Worried About Running Out of Food in the Last Year: Often true    Ran Out of Food in the Last Year: Often true  Transportation Needs: Unmet Transportation Needs (09/05/2024)   PRAPARE - Administrator, Civil Service (Medical): Yes    Lack of Transportation (Non-Medical): Yes  Physical Activity: Not on file  Stress: Not on file  Social Connections: Not on file   Additional Social History:                         Sleep: Good Estimated Sleeping Duration (Last 24 Hours): 5.50-6.00 hours (Due to [employer] Time, the durations displayed may not accurately represent documentation during the time change interval)  Appetite:  Good  Current Medications: Current Facility-Administered Medications  Medication Dose Route Frequency Provider Last Rate Last Admin   acetaminophen  (TYLENOL ) tablet 650 mg  650 mg Oral Q6H PRN Mannie Jerel PARAS, NP   650 mg at 09/07/24 0832   alum & mag hydroxide-simeth (MAALOX/MYLANTA) 200-200-20 MG/5ML suspension 30 mL  30 mL Oral Q4H PRN Mannie Jerel PARAS, NP       ARIPiprazole  (ABILIFY ) tablet 10 mg  10 mg Oral Daily Towana Leita SAILOR, MD       haloperidol  (HALDOL ) tablet 5 mg  5 mg Oral TID PRN Mannie Jerel PARAS, NP       And    diphenhydrAMINE  (BENADRYL ) capsule 50 mg  50 mg Oral TID PRN Mannie Jerel PARAS, NP       haloperidol  lactate (HALDOL ) injection 10 mg  10 mg Intramuscular TID PRN Mannie Jerel PARAS, NP       And   diphenhydrAMINE  (BENADRYL ) injection 50 mg  50 mg Intramuscular TID PRN Mannie Jerel PARAS, NP       And   LORazepam  (ATIVAN ) injection 2 mg  2 mg Intramuscular TID PRN Mannie Jerel PARAS, NP       haloperidol  lactate (HALDOL ) injection 5 mg  5 mg Intramuscular TID PRN Mannie Jerel PARAS, NP       And   diphenhydrAMINE  (BENADRYL ) injection 50 mg  50 mg Intramuscular TID PRN Mannie,  Jerel PARAS, NP       And   LORazepam  (ATIVAN ) injection 2 mg  2 mg Intramuscular TID PRN Mannie Jerel PARAS, NP       divalproex  (DEPAKOTE  ER) 24 hr tablet 1,000 mg  1,000 mg Oral QHS Butler, Laura N, MD       feeding supplement (ENSURE PLUS HIGH PROTEIN) liquid 237 mL  237 mL Oral BID BM Jhordan Kinter S, DO   237 mL at 09/08/24 2250   lidocaine  (LIDODERM ) 5 % 1 patch  1 patch Transdermal Daily Towana Leita SAILOR, MD   1 patch at 09/07/24 1149   magnesium  hydroxide (MILK OF MAGNESIA) suspension 30 mL  30 mL Oral Daily PRN Mannie Jerel PARAS, NP       nicotine  (NICODERM CQ  - dosed in mg/24 hours) patch 21 mg  21 mg Transdermal Daily Mannie Jerel PARAS, NP       sodium chloride  (OCEAN) 0.65 % nasal spray 1 spray  1 spray Each Nare BID PRN Bobbitt, Shalon E, NP   1 spray at 09/07/24 1706   traZODone  (DESYREL ) tablet 100-200 mg  100-200 mg Oral QHS PRN Mannie Jerel PARAS, NP   200 mg at 09/08/24 2018    Lab Results: No results found for this or any previous visit (from the past 48 hours).  Blood Alcohol level:  Lab Results  Component Value Date   ETH <15 09/04/2024   ETH 26 (H) 08/21/2024    Metabolic Disorder Labs: Lab Results  Component Value Date   HGBA1C 5.8 (H) 12/31/2022   MPG 120 12/31/2022   MPG 111 04/02/2021   No results found for: PROLACTIN Lab Results  Component Value Date   CHOL 149 12/31/2022   TRIG 160 (H)  12/31/2022   HDL 26 (L) 12/31/2022   CHOLHDL 5.7 12/31/2022   VLDL 32 12/31/2022   LDLCALC 91 12/31/2022   LDLCALC 122 (H) 05/04/2021    Physical Findings: AIMS:  ,  ,  ,  ,  ,  ,   CIWA:    COWS:     Musculoskeletal: Strength & Muscle Tone: within normal limits Gait & Station: laying in bed Patient leans: N/A  Psychiatric Specialty Exam:  Presentation  General Appearance:  Disheveled (covers pulled over head)  Eye Contact: Poor  Speech: Garbled  Speech Volume: Normal  Handedness:No data recorded  Mood and Affect  Mood: Irritable  Affect: Congruent   Thought Process  Thought Processes: Linear  Descriptions of Associations:Intact  Orientation:Full (Time, Place and Person)  Thought Content:WDL  History of Schizophrenia/Schizoaffective disorder:No  Duration of Psychotic Symptoms:Less than six months  Hallucinations:Hallucinations: None  Ideas of Reference:None  Suicidal Thoughts:Suicidal Thoughts: No (not right now)  Homicidal Thoughts:Homicidal Thoughts: No   Sensorium  Memory: Immediate Fair  Judgment: Poor  Insight: Shallow   Executive Functions  Concentration: Poor  Attention Span: Poor  Recall: Poor  Fund of Knowledge: Poor  Language: Poor   Psychomotor Activity  Psychomotor Activity: Psychomotor Activity: Normal   Assets  Assets: Resilience   Sleep  Sleep: Sleep: Good    Physical Exam: Physical Exam Vitals and nursing note reviewed.  Constitutional:      General: He is not in acute distress.    Appearance: Normal appearance. He is not ill-appearing or toxic-appearing.  HENT:     Head: Normocephalic and atraumatic.  Pulmonary:     Effort: Pulmonary effort is normal.  Neurological:     Mental Status: He is alert.  Review of Systems  Respiratory:  Negative for cough and shortness of breath.   Cardiovascular:  Negative for chest pain.  Gastrointestinal:  Negative for abdominal pain,  constipation, diarrhea, nausea and vomiting.  Neurological:  Negative for dizziness, weakness and headaches.  Psychiatric/Behavioral:  Negative for depression, hallucinations and suicidal ideas. The patient is not nervous/anxious.    Blood pressure 120/80, pulse 95, temperature 97.6 F (36.4 C), temperature source Oral, resp. rate 20, height 5' 10 (1.778 m), weight 90.7 kg, SpO2 100%. Body mass index is 28.7 kg/m.   Treatment Plan Summary: Daily contact with patient to assess and evaluate symptoms and progress in treatment and Medication management  Cory Floyd is a 48 y.o. male  with a past psychiatric history of bipolar 1 disorder, substance-induced psychosis, stimulant use disorder (methamphetamine, cocaine, alcohol). He has multiple past psychiatric hospitalizations and an ACT team. Patient initially arrived to [facility] on 09/04/24 for hallucinations and paranoid ideation and admitted to [facility] under IVC on 11/14 for acute safety concerns and crisis stabalization.  Patient has a history of bipolar disorder as well as substance-induced psychosis. On this admission he has presented with paranoia, was intermittently responding to internal stimuli, and disorganized in his thought process. Upon arrival, he remained paranoid and uncooperative on exam, demonstrating overt psychotic symptoms and IVC was continued. Home medications (abilify , mirtazapine  and tegretol ) were continued.    Cory Floyd continues to isolate to his room and minimally engage in interviews with providers or staff.  Given continued sedation and not voicing psychotic symptoms it does continue to appear that substance use is the main factor.  We will continue to offer medications.  We will not make any changes to his medications at this time.  We may need to do a room lock out order tomorrow if he continues to isolate to his room.  We will continue to monitor.    Diagnoses / Active Problems: Substance-induced psychosis  Bipolar 1  Disorder Stimulant use disorder    PLAN: Safety and Monitoring:            --  INVOLUNTARY  admission to inpatient psychiatric unit for safety, stabilization and treatment            -- Daily contact with patient to assess and evaluate symptoms and progress in treatment            -- Patient's case to be discussed in multi-disciplinary team meeting            -- Observation Level : q15 minute checks            -- Vital signs:  q12 hours            -- Precautions: suicide, elopement, and assault   2. Psychiatric Diagnoses and Treatment:  -Continue Abilify  to 10 mg and offer during the day (has not clearly taken in some time, will titrate as indicated/tolerated  -Continue Depakote  1000 mg QHS for mood stability             --Hold home trileptal 300mg  BID (refusing, stating he does not take at home)            --Hold home remeron  15mg  at bedtime (refusing, excessively sleeping).                           -- Metabolic profile and EKG monitoring obtained while on an atypical antipsychotic  BMI: 28.7 TSH: WNL Most recent QTC (11/14) of  434     3. Medical Issues Being Addressed:  --none, patient has history of rhabdomyolysis and cellulitis   4. Discharge Planning:             -- Social work and case management to assist with discharge planning and identification of hospital follow-up needs prior to discharge            -- Estimated LOS: 5-7 days            -- Discharge Concerns: Need to establish a safety plan; Medication compliance and effectiveness            -- Discharge Goals: Return home with outpatient referrals for mental health follow-up including medication management/psychotherapy     Marsa GORMAN Rosser, DO 09/09/2024, 4:28 PM

## 2024-09-09 NOTE — Plan of Care (Signed)
  Problem: Education: Goal: Mental status will improve Outcome: Not Progressing   Problem: Activity: Goal: Interest or engagement in activities will improve Outcome: Not Progressing Goal: Sleeping patterns will improve Outcome: Not Progressing   

## 2024-09-09 NOTE — [facility] Group Notes (Signed)
 [facility] Group Notes:  (Nursing/MHT/Case Management/Adjunct)  Date:  09/09/2024  Time:  9:25 PM  Type of Therapy:  Psychoeducational Skills  Participation Level:  Minimal  Participation Quality:  Attentive  Affect:  Flat  Cognitive:  Lacking  Insight:  Lacking  Engagement in Group:  Limited  Modes of Intervention:  Education  Summary of Progress/Problems: Patient stated that he had a relaxing day but did not go into further. Patient states that he was in bed all day long and that this was the first group that he has attended.   [name] 09/09/2024, 9:25 PM

## 2024-09-09 NOTE — Group Note (Signed)
 Date:  09/09/2024 Time:  10:08 AM  Group Topic/Focus: Coping and goals group  Goals Group:   The focus of this group is to help patients establish daily goals to achieve during treatment and discuss how the patient can incorporate goal setting into their daily lives to aide in recovery. Coping skills for patients (more respectfully called people living with mental health conditions) are tools, strategies, or behaviors that help a person manage stress, difficult emotions, symptoms, and challenging situations.   Participation Level:  Did Not Attend   Dolores CHRISTELLA Fredericks 09/09/2024, 10:08 AM

## 2024-09-09 NOTE — Progress Notes (Signed)
(  Sleep Hours) -6.75  (Any PRNs that were needed, meds refused, or side effects to meds)- Refused Depakote . Refused Tylenol  for pain. Did request, and take, Trazodone  200mg .  (Any disturbances and when (visitation, over night)-none  (Concerns raised by the patient)- patient irritable. States my fingers go numb and my back hurts and nobody gives a damn nobody listens. Also stated has dog bite on left leg and nobody has looked at it. (Pt does, in fact, have a bite consisting of abrasions and bruising on left calf. No s/s infection noted)  (SI/HI/AVH)-denies

## 2024-09-09 NOTE — Group Note (Signed)
 Date:  09/09/2024 Time:  10:27 AM  Group Topic/Focus: Pet therapy is a therapeutic approach where trained animals--most commonly dogs, cats, horses, or even rabbits--are used to help improve the emotional, social, and mental well-being of people living with mental health conditions.    Participation Level:  Did Not Attend   Cory Floyd 09/09/2024, 10:27 AM

## 2024-09-09 NOTE — Group Note (Signed)
 Recreation Therapy Group Note   Group Topic:Animal Assisted Therapy   Group Date: 09/09/2024 Start Time: 0945 End Time: 1030 Facilitators: Lana Flaim-McCall, LRT,CTRS Location: [address] Dayroom   Animal-Assisted Activity (AAA) Program Checklist/Progress Notes Patient Eligibility Criteria Checklist & Daily Group note for Rec Tx Intervention  AAA/T Program Assumption of Risk Form signed by Patient/ or Parent Legal Guardian Yes  Patient understands his/her participation is voluntary Yes  Behavioral Response:    Education: Charity Fundraiser, Appropriate Animal Interaction   Education Outcome: Acknowledges education.    Affect/Mood: N/A   Participation Level: Did not attend    Clinical Observations/Individualized Feedback:      Plan: Continue to engage patient in RT group sessions 2-3x/week.   Cory Floyd, LRT,CTRS 09/09/2024 12:55 PM

## 2024-09-09 NOTE — Progress Notes (Signed)
   09/09/24 0800  Psych Admission Type (Psych Patients Only)  Admission Status Involuntary  Psychosocial Assessment  Patient Complaints Anergic;Depression  Eye Contact Brief  Facial Expression Pensive  Affect Depressed  Speech Slow  Interaction Minimal  Motor Activity Slow  Appearance/Hygiene Poor hygiene  Behavior Characteristics Unwilling to participate  Mood Depressed  Thought Process  Coherency WDL  Content Blaming others;Paranoia  Delusions WDL  Perception WDL  Hallucination None reported or observed  Judgment Poor  Confusion None  Danger to Self  Current suicidal ideation? Denies  Agreement Not to Harm Self Yes  Description of Agreement verbal

## 2024-09-09 NOTE — Group Note (Signed)
 Date:  09/09/2024 Time:  5:04 PM  Group Topic/Focus:  Self Care:   The focus of this group is to help patients understand sleep hygiene and the importance of self-care in order to improve or restore emotional, physical, spiritual, interpersonal, and financial health.   Additional Comments:  patient did not attend   Powell JAYSON Sharps 09/09/2024, 5:04 PM

## 2024-09-10 NOTE — Progress Notes (Signed)
 [facility] MD Progress Note  09/10/2024 4:03 PM Cory Floyd  MRN:  [id number] Subjective:    Cory Floyd is a 48 y.o. male  with a past psychiatric history of bipolar 1 disorder, substance-induced psychosis, stimulant use disorder (methamphetamine, cocaine, alcohol). He has multiple past psychiatric hospitalizations and an ACT team. Patient initially arrived to [facility] on 09/04/24 for hallucinations and paranoid ideation and admitted to [facility] under IVC on 11/14 for acute safety concerns and crisis stabalization.  Patient has a history of bipolar disorder as well as substance-induced psychosis.      Case was discussed in the multidisciplinary team. MAR was reviewed and patient was not compliant with medications.  He received PRN Tylenol  and Trazodone  yesterday.     Psychiatric Team made the following recommendations yesterday: -Continue Abilify  10 mg daily for mood stability and psychosis --Hold home trileptal 300mg  BID (refusing, stating he does not take at home) --Hold home remeron  15mg  at bedtime (refusing, excessively sleeping). -Start Depakote  1000 mg QHS for mood stability     On interview today patient reports he slept fair last night.  He reports his appetite is doing fair.  When asked about SI and AVH he reports I don't want to talk about it.  He reports no HI.  When asked why he did not take his medications yesterday he reports it is because he does not know what he taking.  Discussed with him that we are offering 2 medications.  The first is Abilify  and the second is Depakote .  He reports that he has been on those in the past and is agreeable to taking them.  Thanked him for taking his medications.  He reports no other concerns at present.   Principal Problem: Psychoactive substance-induced psychosis (HCC) Diagnosis: Principal Problem:   Psychoactive substance-induced psychosis (HCC) Active Problems:   Bipolar affective disorder, current episode manic (HCC)   Severe stimulant use  disorder (HCC)  Total Time spent with patient:  I personally spent 35 minutes on the unit in direct patient care. The direct patient care time included face-to-face time with the patient, reviewing the patient's chart, communicating with other professionals, and coordinating care.    Past Psychiatric History:  bipolar 1 disorder, substance-induced psychosis, stimulant use disorder (methamphetamine, cocaine, alcohol). He has multiple past psychiatric hospitalizations and an ACT team.   Past Medical History:  Past Medical History:  Diagnosis Date   Alcoholic (HCC)    clean for 3 years   Anginal pain    Arthritis    Bipolar 1 disorder (HCC)    COVID-19    Depressed    Diabetes mellitus without complication (HCC)    Dyspnea    Fatty liver    Hypertension    Schizophrenia (HCC)     Past Surgical History:  Procedure Laterality Date   COLONOSCOPY  02/05/2014   diverticulosis, hyperplastic polyp x 2   fracture arm Right    INCISION AND DRAINAGE ABSCESS Bilateral 12/29/2022   Procedure: INCISION AND DRAINAGE ABSCESS BILATERAL ARMS;  Surgeon: Kendal Franky SQUIBB, MD;  Location: [facility] OR;  Service: Orthopedics;  Laterality: Bilateral;   KNEE SURGERY Right 2013   NASAL SEPTUM SURGERY     NASAL TURBINATE REDUCTION Bilateral 12/12/2016   Procedure: TURBINATE REDUCTION/SUBMUCOSAL RESECTION;  Surgeon: Chinita Hasten, MD;  Location: [facility] ORS;  Service: ENT;  Laterality: Bilateral;   Family History:  Family History  Problem Relation Age of Onset   Hepatitis C Mother    Cancer Mother  Cancer Maternal Aunt    Hypertension Maternal Grandmother    Family Psychiatric  History:  uncle and aunt who completed suicide   Social History:  Social History   Substance and Sexual Activity  Alcohol Use Yes   Comment: 1/2-5th pint of liquor     Social History   Substance and Sexual Activity  Drug Use Yes   Types: Cocaine, Marijuana   Comment: heroin    Social History   Socioeconomic History    Marital status: Divorced    Spouse name: Not on file   Number of children: Not on file   Years of education: Not on file   Highest education level: Not on file  Occupational History   Not on file  Tobacco Use   Smoking status: Every Day    Current packs/day: 1.00    Average packs/day: 1 pack/day for 20.0 years (20.0 ttl pk-yrs)    Types: Cigarettes   Smokeless tobacco: Never  Vaping Use   Vaping status: Never Used  Substance and Sexual Activity   Alcohol use: Yes    Comment: 1/2-5th pint of liquor   Drug use: Yes    Types: Cocaine, Marijuana    Comment: heroin   Sexual activity: Yes    Birth control/protection: None  Other Topics Concern   Not on file  Social History Narrative   Not on file   Social Drivers of Health   Financial Resource Strain: Not on file  Food Insecurity: Food Insecurity Present (09/05/2024)   Hunger Vital Sign    Worried About Running Out of Food in the Last Year: Often true    Ran Out of Food in the Last Year: Often true  Transportation Needs: Unmet Transportation Needs (09/05/2024)   PRAPARE - Administrator, Civil Service (Medical): Yes    Lack of Transportation (Non-Medical): Yes  Physical Activity: Not on file  Stress: Not on file  Social Connections: Not on file   Additional Social History:                         Sleep: Fair Estimated Sleeping Duration (Last 24 Hours): 14.25-15.50 hours (Due to [employer] Time, the durations displayed may not accurately represent documentation during the time change interval)  Appetite:  Fair  Current Medications: Current Facility-Administered Medications  Medication Dose Route Frequency Provider Last Rate Last Admin   acetaminophen  (TYLENOL ) tablet 650 mg  650 mg Oral Q6H PRN Mannie Jerel PARAS, NP   650 mg at 09/09/24 2032   alum & mag hydroxide-simeth (MAALOX/MYLANTA) 200-200-20 MG/5ML suspension 30 mL  30 mL Oral Q4H PRN Mannie Jerel PARAS, NP       ARIPiprazole  (ABILIFY )  tablet 10 mg  10 mg Oral Daily Towana Leita SAILOR, MD   10 mg at 09/10/24 9183   haloperidol  (HALDOL ) tablet 5 mg  5 mg Oral TID PRN Mannie Jerel PARAS, NP       And   diphenhydrAMINE  (BENADRYL ) capsule 50 mg  50 mg Oral TID PRN Mannie Jerel PARAS, NP       haloperidol  lactate (HALDOL ) injection 10 mg  10 mg Intramuscular TID PRN Mannie Jerel PARAS, NP       And   diphenhydrAMINE  (BENADRYL ) injection 50 mg  50 mg Intramuscular TID PRN Mannie Jerel PARAS, NP       And   LORazepam  (ATIVAN ) injection 2 mg  2 mg Intramuscular TID PRN Mannie Jerel PARAS, NP  haloperidol  lactate (HALDOL ) injection 5 mg  5 mg Intramuscular TID PRN Mannie Jerel PARAS, NP       And   diphenhydrAMINE  (BENADRYL ) injection 50 mg  50 mg Intramuscular TID PRN Mannie Jerel PARAS, NP       And   LORazepam  (ATIVAN ) injection 2 mg  2 mg Intramuscular TID PRN Mannie Jerel PARAS, NP       divalproex  (DEPAKOTE  ER) 24 hr tablet 1,000 mg  1,000 mg Oral QHS Butler, Laura N, MD       feeding supplement (ENSURE PLUS HIGH PROTEIN) liquid 237 mL  237 mL Oral BID BM Keylin Ferryman, Marsa RAMAN, DO   237 mL at 09/10/24 1028   lidocaine  (LIDODERM ) 5 % 1 patch  1 patch Transdermal Daily Towana Leita SAILOR, MD   1 patch at 09/07/24 1149   magnesium  hydroxide (MILK OF MAGNESIA) suspension 30 mL  30 mL Oral Daily PRN Mannie Jerel PARAS, NP       nicotine  (NICODERM CQ  - dosed in mg/24 hours) patch 21 mg  21 mg Transdermal Daily Mannie Jerel PARAS, NP       sodium chloride  (OCEAN) 0.65 % nasal spray 1 spray  1 spray Each Nare BID PRN Bobbitt, Shalon E, NP   1 spray at 09/07/24 1706   traZODone  (DESYREL ) tablet 100-200 mg  100-200 mg Oral QHS PRN Mannie Jerel PARAS, NP   200 mg at 09/09/24 2031    Lab Results: No results found for this or any previous visit (from the past 48 hours).  Blood Alcohol level:  Lab Results  Component Value Date   ETH <15 09/04/2024   ETH 26 (H) 08/21/2024    Metabolic Disorder Labs: Lab Results  Component Value Date   HGBA1C 5.8 (H)  12/31/2022   MPG 120 12/31/2022   MPG 111 04/02/2021   No results found for: PROLACTIN Lab Results  Component Value Date   CHOL 149 12/31/2022   TRIG 160 (H) 12/31/2022   HDL 26 (L) 12/31/2022   CHOLHDL 5.7 12/31/2022   VLDL 32 12/31/2022   LDLCALC 91 12/31/2022   LDLCALC 122 (H) 05/04/2021    Physical Findings: AIMS:  ,  ,  ,  ,  ,  ,   CIWA:    COWS:     Musculoskeletal: Strength & Muscle Tone: within normal limits Gait & Station: normal Patient leans: N/A  Psychiatric Specialty Exam:  Presentation  General Appearance:  Casual  Eye Contact: Fair  Speech: Clear and Coherent; Normal Rate  Speech Volume: Normal  Handedness:No data recorded  Mood and Affect  Mood: -- (ok)  Affect: Constricted   Thought Process  Thought Processes: Linear  Descriptions of Associations:Intact  Orientation:Full (Time, Place and Person)  Thought Content:WDL  History of Schizophrenia/Schizoaffective disorder:No  Duration of Psychotic Symptoms:Less than six months  Hallucinations:Hallucinations: -- (I don't want to discuss that right now)  Ideas of Reference:None  Suicidal Thoughts:Suicidal Thoughts: -- (I don't want to discuss that right now)  Homicidal Thoughts:Homicidal Thoughts: No   Sensorium  Memory: Immediate Fair  Judgment: Intact  Insight: Present   Executive Functions  Concentration: Fair  Attention Span: Fair  Recall: [employer] of Knowledge: Fair  Language: Fair   Psychomotor Activity  Psychomotor Activity: Psychomotor Activity: Normal   Assets  Assets: Resilience   Sleep  Sleep: Sleep: Fair    Physical Exam: Physical Exam Vitals and nursing note reviewed.  Constitutional:      General: He is not  in acute distress.    Appearance: Normal appearance. He is normal weight. He is not ill-appearing or toxic-appearing.  HENT:     Head: Normocephalic and atraumatic.  Pulmonary:     Effort: Pulmonary  effort is normal.  Musculoskeletal:        General: Normal range of motion.  Neurological:     General: No focal deficit present.     Mental Status: He is alert.    Review of Systems  Respiratory:  Negative for cough and shortness of breath.   Cardiovascular:  Negative for chest pain.  Gastrointestinal:  Negative for abdominal pain, constipation, diarrhea, nausea and vomiting.  Neurological:  Negative for dizziness, weakness and headaches.  Psychiatric/Behavioral:  Negative for depression. The patient is not nervous/anxious.        When asked about SI and AVH- I don't want to discuss that right now   Blood pressure 124/80, pulse 83, temperature 97.6 F (36.4 C), temperature source Oral, resp. rate 20, height 5' 10 (1.778 m), weight 90.7 kg, SpO2 100%. Body mass index is 28.7 kg/m.   Treatment Plan Summary: Daily contact with patient to assess and evaluate symptoms and progress in treatment and Medication management  Cory Floyd is a 48 y.o. male  with a past psychiatric history of bipolar 1 disorder, substance-induced psychosis, stimulant use disorder (methamphetamine, cocaine, alcohol). He has multiple past psychiatric hospitalizations and an ACT team. Patient initially arrived to [facility] on 09/04/24 for hallucinations and paranoid ideation and admitted to [facility] under IVC on 11/14 for acute safety concerns and crisis stabalization.  Patient has a history of bipolar disorder as well as substance-induced psychosis. On this admission he has presented with paranoia, was intermittently responding to internal stimuli, and disorganized in his thought process. Upon arrival, he remained paranoid and uncooperative on exam, demonstrating overt psychotic symptoms and IVC was continued. Home medications (abilify , mirtazapine  and tegretol ) were continued.      Cory Floyd is still isolating to his room but discussed with him what medications were ordered and he did take his Abilify .  We are hopeful he will  take his Depakote  this evening.  We will consider room lockout tomorrow.  He does seem to be improving as he did participate in the interview so there is clearing from substance use.  We will not make any changes to his medications at this time.  We will continue to monitor.      Diagnoses / Active Problems: Substance-induced psychosis  Bipolar 1 Disorder Stimulant use disorder    PLAN: Safety and Monitoring:            --  INVOLUNTARY  admission to inpatient psychiatric unit for safety, stabilization and treatment            -- Daily contact with patient to assess and evaluate symptoms and progress in treatment            -- Patient's case to be discussed in multi-disciplinary team meeting            -- Observation Level : q15 minute checks            -- Vital signs:  q12 hours            -- Precautions: suicide, elopement, and assault    2. Psychiatric Diagnoses and Treatment:  -Continue Abilify  10 mg daily for mood stability and psychosis -Continue Depakote  1000 mg QHS for mood stability              --  Hold home trileptal 300mg  BID (refusing, stating he does not take at home)            --Hold home remeron  15mg  at bedtime (refusing, excessively sleeping).                           -- Metabolic profile and EKG monitoring obtained while on an atypical antipsychotic  BMI: 28.7 TSH: WNL Most recent QTC (11/14) of 434     3. Medical Issues Being Addressed:  --none, patient has history of rhabdomyolysis and cellulitis    4. Discharge Planning:             -- Social work and case management to assist with discharge planning and identification of hospital follow-up needs prior to discharge            -- Estimated LOS: 4-6 days            -- Discharge Concerns: Need to establish a safety plan; Medication compliance and effectiveness            -- Discharge Goals: Return home with outpatient referrals for mental health follow-up including medication management/psychotherapy      Marsa GORMAN Rosser, DO 09/10/2024, 4:03 PM

## 2024-09-10 NOTE — Group Note (Signed)
 Date:  09/10/2024 Time:  4:40 PM  Group Topic/Focus: Gut flora Making Healthy Choices:   The focus of this group is to help patients identify negative/unhealthy choices they were using prior to admission and identify positive/healthier coping strategies to replace them upon discharge.    Participation Level:  Did Not Attend  Juliene CHRISTELLA Huddle 09/10/2024, 4:40 PM

## 2024-09-10 NOTE — Progress Notes (Signed)
(  Sleep Hours) -8.75 (Any PRNs that were needed, meds refused, or side effects to meds)- Refused Depakote ,. Prn: Trazodone  and tylenol  (Any disturbances and when (visitation, over night)-none (Concerns raised by the patient)-none (SI/HI/AVH)-denied

## 2024-09-10 NOTE — [facility] Group Notes (Signed)
 [facility] Group Notes:  (Nursing/MHT/Case Management/Adjunct)  Date:  09/10/2024  Time:  9:30 PM  Type of Therapy:  Psychoeducational Skills  Participation Level:  Did Not Attend  Participation Quality:  did not attend  Affect:  Resistant  Cognitive:  did not attend  Insight:  None  Engagement in Group:  did not attend  Modes of Intervention:  Education  Summary of Progress/Problems: Patient did not attend group this evening.   Detrick Dani S 09/10/2024, 9:30 PM

## 2024-09-10 NOTE — Plan of Care (Addendum)
 Pt was in bed most of the day around 1645 went to the dayroom and was acting appropriately. Didn't go to the cafeteria for breakfast nor lunch but ate adequately in his room. He did go to [employer] for dinner. Didn't attend group activity. Denies SI/HI/SH/paranoia/AVH. Will continue to monitor.

## 2024-09-10 NOTE — Plan of Care (Signed)
   Problem: Education: Goal: Emotional status will improve Outcome: Progressing Goal: Mental status will improve Outcome: Progressing

## 2024-09-10 NOTE — Group Note (Signed)
 Date:  09/10/2024 Time:  11:58 AM  Group Topic/Focus:  Pharmacy Group    Participation Level:  Did Not Attend   Cory Floyd 09/10/2024, 11:58 AM

## 2024-09-10 NOTE — Group Note (Addendum)
 Date:  09/10/2024 Time:  10:59 AM  Group Topic/Focus:  Recreational Therapy Group    Participation Level:  Patient attended group.  Cory Floyd 09/10/2024, 10:59 AM

## 2024-09-10 NOTE — Group Note (Signed)
 Recreation Therapy Group Note   Group Topic:Team Building  Group Date: 09/10/2024 Start Time: 0936 End Time: 1019 Facilitators: Amijah Timothy-McCall, LRT,CTRS Location: [address] Dayroom   Group Topic: Communication, Team Building, Problem Solving  Goal Area(s) Addresses:  Patient will effectively work with peer towards shared goal.  Patient will identify skills used to make activity successful.  Patient will identify how skills used during activity can be used to reach post d/c goals.   Behavioral Response:   Intervention: STEM Activity  Activity: Landing Pad. In teams of 3-5, patients were given 12 plastic drinking straws and an equal length of masking tape. Using the materials provided, patients were asked to build a landing pad to catch a golf ball dropped from approximately 5 feet in the air. All materials were required to be used by the team in their design. LRT facilitated post-activity discussion.  Education: Pharmacist, Community, Scientist, Physiological, Discharge Planning   Education Outcome: Acknowledges education/In group clarification offered/Needs additional education.    Affect/Mood: N/A   Participation Level: Did not attend    Clinical Observations/Individualized Feedback:      Plan: Continue to engage patient in RT group sessions 2-3x/week.   Ruthellen Tippy-McCall, LRT,CTRS 09/10/2024 1:02 PM

## 2024-09-10 NOTE — Group Note (Signed)
 Date:  09/10/2024 Time:  10:42 AM  Group Topic/Focus:  Goals Group:   The focus of this group is to help patients establish daily goals to achieve during treatment and discuss how the patient can incorporate goal setting into their daily lives to aide in recovery. Identifying Needs:   The focus of this group is to help patients identify their personal needs that have been historically problematic and identify healthy behaviors to address their needs. Managing Feelings:   The focus of this group is to identify what feelings patients have difficulty handling and develop a plan to handle them in a healthier way upon discharge.    Participation Level:  Did Not Attend  Participation Quality:    Affect:    Cognitive:    Insight:   Engagement in Group:    Modes of Intervention:    Additional Comments:  Patient did not attend group.  Allyn Bartelson Byrd 09/10/2024, 10:42 AM

## 2024-09-10 NOTE — Progress Notes (Signed)
Pt refused labs today

## 2024-09-10 NOTE — Group Note (Signed)
 Date:  09/10/2024 Time:  1:52 PM  Group Topic/Focus:  Chaplain Group     Participation Level:  Did Not Attend      Hayzen Lorenson Byrd 09/10/2024, 1:52 PM

## 2024-09-11 NOTE — Progress Notes (Signed)
 [facility] MD Progress Note  09/11/2024 11:47 AM DA AUTHEMENT  MRN:  [id number] Subjective:   Cory Floyd is a 48 y.o. male  with a past psychiatric history of bipolar 1 disorder, substance-induced psychosis, stimulant use disorder (methamphetamine, cocaine, alcohol). He has multiple past psychiatric hospitalizations and an ACT team. Patient initially arrived to [facility] on 09/04/24 for hallucinations and paranoid ideation and admitted to [facility] under IVC on 11/14 for acute safety concerns and crisis stabalization.  Patient has a history of bipolar disorder as well as substance-induced psychosis.      Case was discussed in the multidisciplinary team. MAR was reviewed and patient was not compliant with medications.  He received PRN Tylenol  and Trazodone  yesterday.     Psychiatric Team made the following recommendations yesterday: -Continue Abilify  10 mg daily for mood stability and psychosis --Hold home trileptal 300mg  BID (refusing, stating he does not take at home) --Hold home remeron  15mg  at bedtime (refusing, excessively sleeping). -Start Depakote  1000 mg QHS for mood stability     On interview today patient reports he slept fair last night.  He reports his appetite is doing fair.  He reports no issues with his medications.  When asked about Paranoia he reports he saw people from his past here and is scared by them.  When asked about SI and AVH he would not answer.  He reports no HI.  He asked when he would see a doctor and when told he was speaking to a doctor and had been seen daily he seemed confused by this.  Thanked him for taking his Abilify  yesterday and encouraged him to continue taking it and he reports he will.  He reports no other concerns at present.   Principal Problem: Psychoactive substance-induced psychosis (HCC) Diagnosis: Principal Problem:   Psychoactive substance-induced psychosis (HCC) Active Problems:   Bipolar affective disorder, current episode manic (HCC)   Severe  stimulant use disorder (HCC)  Total Time spent with patient:  I personally spent 35 minutes on the unit in direct patient care. The direct patient care time included face-to-face time with the patient, reviewing the patient's chart, communicating with other professionals, and coordinating care.    Past Psychiatric History:  bipolar 1 disorder, substance-induced psychosis, stimulant use disorder (methamphetamine, cocaine, alcohol). He has multiple past psychiatric hospitalizations and an ACT team.   Past Medical History:  Past Medical History:  Diagnosis Date   Alcoholic (HCC)    clean for 3 years   Anginal pain    Arthritis    Bipolar 1 disorder (HCC)    COVID-19    Depressed    Diabetes mellitus without complication (HCC)    Dyspnea    Fatty liver    Hypertension    Schizophrenia (HCC)     Past Surgical History:  Procedure Laterality Date   COLONOSCOPY  02/05/2014   diverticulosis, hyperplastic polyp x 2   fracture arm Right    INCISION AND DRAINAGE ABSCESS Bilateral 12/29/2022   Procedure: INCISION AND DRAINAGE ABSCESS BILATERAL ARMS;  Surgeon: Kendal Franky SQUIBB, MD;  Location: [facility] OR;  Service: Orthopedics;  Laterality: Bilateral;   KNEE SURGERY Right 2013   NASAL SEPTUM SURGERY     NASAL TURBINATE REDUCTION Bilateral 12/12/2016   Procedure: TURBINATE REDUCTION/SUBMUCOSAL RESECTION;  Surgeon: Chinita Hasten, MD;  Location: [facility] ORS;  Service: ENT;  Laterality: Bilateral;   Family History:  Family History  Problem Relation Age of Onset   Hepatitis C Mother    Cancer Mother  Cancer Maternal Aunt    Hypertension Maternal Grandmother    Family Psychiatric  History:  uncle and aunt who completed suicide   Social History:  Social History   Substance and Sexual Activity  Alcohol Use Yes   Comment: 1/2-5th pint of liquor     Social History   Substance and Sexual Activity  Drug Use Yes   Types: Cocaine, Marijuana   Comment: heroin    Social History    Socioeconomic History   Marital status: Divorced    Spouse name: Not on file   Number of children: Not on file   Years of education: Not on file   Highest education level: Not on file  Occupational History   Not on file  Tobacco Use   Smoking status: Every Day    Current packs/day: 1.00    Average packs/day: 1 pack/day for 20.0 years (20.0 ttl pk-yrs)    Types: Cigarettes   Smokeless tobacco: Never  Vaping Use   Vaping status: Never Used  Substance and Sexual Activity   Alcohol use: Yes    Comment: 1/2-5th pint of liquor   Drug use: Yes    Types: Cocaine, Marijuana    Comment: heroin   Sexual activity: Yes    Birth control/protection: None  Other Topics Concern   Not on file  Social History Narrative   Not on file   Social Drivers of Health   Financial Resource Strain: Not on file  Food Insecurity: Food Insecurity Present (09/05/2024)   Hunger Vital Sign    Worried About Running Out of Food in the Last Year: Often true    Ran Out of Food in the Last Year: Often true  Transportation Needs: Unmet Transportation Needs (09/05/2024)   PRAPARE - Administrator, Civil Service (Medical): Yes    Lack of Transportation (Non-Medical): Yes  Physical Activity: Not on file  Stress: Not on file  Social Connections: Not on file   Additional Social History:                         Sleep: Fair Estimated Sleeping Duration (Last 24 Hours): 14.25-17.00 hours (Due to [employer] Time, the durations displayed may not accurately represent documentation during the time change interval)  Appetite:  Fair  Current Medications: Current Facility-Administered Medications  Medication Dose Route Frequency Provider Last Rate Last Admin   acetaminophen  (TYLENOL ) tablet 650 mg  650 mg Oral Q6H PRN Mannie Jerel PARAS, NP   650 mg at 09/09/24 2032   alum & mag hydroxide-simeth (MAALOX/MYLANTA) 200-200-20 MG/5ML suspension 30 mL  30 mL Oral Q4H PRN Mannie Jerel PARAS, NP        ARIPiprazole  (ABILIFY ) tablet 10 mg  10 mg Oral Daily Towana Leita SAILOR, MD   10 mg at 09/11/24 9167   haloperidol  (HALDOL ) tablet 5 mg  5 mg Oral TID PRN Mannie Jerel PARAS, NP       And   diphenhydrAMINE  (BENADRYL ) capsule 50 mg  50 mg Oral TID PRN Mannie Jerel PARAS, NP       haloperidol  lactate (HALDOL ) injection 10 mg  10 mg Intramuscular TID PRN Mannie Jerel PARAS, NP       And   diphenhydrAMINE  (BENADRYL ) injection 50 mg  50 mg Intramuscular TID PRN Mannie Jerel PARAS, NP       And   LORazepam  (ATIVAN ) injection 2 mg  2 mg Intramuscular TID PRN Mannie Jerel PARAS, NP  haloperidol  lactate (HALDOL ) injection 5 mg  5 mg Intramuscular TID PRN Mannie Jerel PARAS, NP       And   diphenhydrAMINE  (BENADRYL ) injection 50 mg  50 mg Intramuscular TID PRN Mannie Jerel PARAS, NP       And   LORazepam  (ATIVAN ) injection 2 mg  2 mg Intramuscular TID PRN Mannie Jerel PARAS, NP       divalproex  (DEPAKOTE  ER) 24 hr tablet 1,000 mg  1,000 mg Oral QHS Towana Leita SAILOR, MD       feeding supplement (ENSURE PLUS HIGH PROTEIN) liquid 237 mL  237 mL Oral BID BM Takiesha Mcdevitt S, DO   237 mL at 09/11/24 9166   lidocaine  (LIDODERM ) 5 % 1 patch  1 patch Transdermal Daily Towana Leita SAILOR, MD   1 patch at 09/07/24 1149   magnesium  hydroxide (MILK OF MAGNESIA) suspension 30 mL  30 mL Oral Daily PRN Mannie Jerel PARAS, NP       nicotine  (NICODERM CQ  - dosed in mg/24 hours) patch 21 mg  21 mg Transdermal Daily Mannie Jerel PARAS, NP       sodium chloride  (OCEAN) 0.65 % nasal spray 1 spray  1 spray Each Nare BID PRN Bobbitt, Shalon E, NP   1 spray at 09/07/24 1706   traZODone  (DESYREL ) tablet 100-200 mg  100-200 mg Oral QHS PRN Mannie Jerel PARAS, NP   200 mg at 09/10/24 2053    Lab Results: No results found for this or any previous visit (from the past 48 hours).  Blood Alcohol level:  Lab Results  Component Value Date   ETH <15 09/04/2024   ETH 26 (H) 08/21/2024    Metabolic Disorder Labs: Lab Results  Component Value  Date   HGBA1C 5.8 (H) 12/31/2022   MPG 120 12/31/2022   MPG 111 04/02/2021   No results found for: PROLACTIN Lab Results  Component Value Date   CHOL 149 12/31/2022   TRIG 160 (H) 12/31/2022   HDL 26 (L) 12/31/2022   CHOLHDL 5.7 12/31/2022   VLDL 32 12/31/2022   LDLCALC 91 12/31/2022   LDLCALC 122 (H) 05/04/2021    Physical Findings: AIMS:  ,  ,  ,  ,  ,  ,   CIWA:    COWS:     Musculoskeletal: Strength & Muscle Tone: within normal limits Gait & Station: normal Patient leans: N/A  Psychiatric Specialty Exam:  Presentation  General Appearance:  Casual  Eye Contact: Fair  Speech: Clear and Coherent; Normal Rate  Speech Volume: Normal  Handedness:No data recorded  Mood and Affect  Mood: Irritable  Affect: Congruent; Constricted   Thought Process  Thought Processes: Linear  Descriptions of Associations:Intact  Orientation:Full (Time, Place and Person)  Thought Content:Paranoid Ideation  History of Schizophrenia/Schizoaffective disorder:No  Duration of Psychotic Symptoms:Less than six months  Hallucinations:Hallucinations: -- (will not answer)  Ideas of Reference:Paranoia  Suicidal Thoughts:Suicidal Thoughts: -- (will not answer)  Homicidal Thoughts:Homicidal Thoughts: No   Sensorium  Memory: Immediate Fair  Judgment: Intact  Insight: Present   Executive Functions  Concentration: Fair  Attention Span: Fair  Recall: [employer] of Knowledge: Fair  Language: Fair   Psychomotor Activity  Psychomotor Activity: Psychomotor Activity: Normal   Assets  Assets: Resilience   Sleep  Sleep: Sleep: Fair    Physical Exam: Physical Exam Vitals and nursing note reviewed.  Constitutional:      General: He is not in acute distress.    Appearance: Normal appearance.  He is normal weight. He is not ill-appearing or toxic-appearing.  HENT:     Head: Normocephalic and atraumatic.  Pulmonary:     Effort: Pulmonary  effort is normal.  Musculoskeletal:        General: Normal range of motion.  Neurological:     General: No focal deficit present.     Mental Status: He is alert.    Review of Systems  Respiratory:  Negative for cough and shortness of breath.   Cardiovascular:  Negative for chest pain.  Gastrointestinal:  Negative for abdominal pain, constipation, diarrhea, nausea and vomiting.  Neurological:  Negative for dizziness, weakness and headaches.  Psychiatric/Behavioral:         Would not answer about SI or AVH   Blood pressure 115/80, pulse 70, temperature 97.6 F (36.4 C), temperature source Oral, resp. rate 20, height 5' 10 (1.778 m), weight 90.7 kg, SpO2 100%. Body mass index is 28.7 kg/m.   Treatment Plan Summary: Daily contact with patient to assess and evaluate symptoms and progress in treatment and Medication management  BLESSING OZGA is a 48 y.o. male  with a past psychiatric history of bipolar 1 disorder, substance-induced psychosis, stimulant use disorder (methamphetamine, cocaine, alcohol). He has multiple past psychiatric hospitalizations and an ACT team. Patient initially arrived to [facility] on 09/04/24 for hallucinations and paranoid ideation and admitted to [facility] under IVC on 11/14 for acute safety concerns and crisis stabalization.  Patient has a history of bipolar disorder as well as substance-induced psychosis. On this admission he has presented with paranoia, was intermittently responding to internal stimuli, and disorganized in his thought process. Upon arrival, he remained paranoid and uncooperative on exam, demonstrating overt psychotic symptoms and IVC was continued. Home medications (abilify , mirtazapine  and tegretol ) were continued.      Ryane is still disorganized and paranoid but he has taken his Abilify  twice now.  We will continue to encourage him taking his Abilify  and to start taking his Depakote .  We will not make any changes to his medications at this time.  We will  continue to monitor.      Diagnoses / Active Problems: Substance-induced psychosis  Bipolar 1 Disorder Stimulant use disorder    PLAN: Safety and Monitoring:            --  INVOLUNTARY  admission to inpatient psychiatric unit for safety, stabilization and treatment            -- Daily contact with patient to assess and evaluate symptoms and progress in treatment            -- Patient's case to be discussed in multi-disciplinary team meeting            -- Observation Level : q15 minute checks            -- Vital signs:  q12 hours            -- Precautions: suicide, elopement, and assault    2. Psychiatric Diagnoses and Treatment:  -Continue Abilify  10 mg daily for mood stability and psychosis -Continue Depakote  1000 mg QHS for mood stability              --Hold home trileptal 300mg  BID (refusing, stating he does not take at home)            --Hold home remeron  15mg  at bedtime (refusing, excessively sleeping).                           --  Metabolic profile and EKG monitoring obtained while on an atypical antipsychotic  BMI: 28.7 TSH: WNL Most recent QTC (11/14) of 434     3. Medical Issues Being Addressed:  --none, patient has history of rhabdomyolysis and cellulitis    4. Discharge Planning:             -- Social work and case management to assist with discharge planning and identification of hospital follow-up needs prior to discharge            -- Estimated LOS: 3-5 days            -- Discharge Concerns: Need to establish a safety plan; Medication compliance and effectiveness            -- Discharge Goals: Return home with outpatient referrals for mental health follow-up including medication management/psychotherapy     Cory GORMAN Rosser, DO 09/11/2024, 11:47 AM

## 2024-09-11 NOTE — Progress Notes (Signed)
   09/11/24 1400  Psych Admission Type (Psych Patients Only)  Admission Status Involuntary  Psychosocial Assessment  Patient Complaints Depression;Isolation;Irritability  Eye Contact Brief  Facial Expression Flat  Affect Flat  Speech Slow  Interaction Minimal  Motor Activity Slow  Appearance/Hygiene Unremarkable  Behavior Characteristics Irritable  Mood Depressed  Thought Process  Coherency WDL  Content Blaming others  Delusions None reported or observed  Perception WDL  Hallucination None reported or observed  Judgment Poor  Confusion None  Danger to Self  Current suicidal ideation? Denies  Agreement Not to Harm Self Yes  Description of Agreement Verbal  Danger to Others  Danger to Others None reported or observed

## 2024-09-11 NOTE — Group Note (Signed)
 Date:  09/11/2024 Time:  10:12 AM  Group Topic/Focus:  Goals Group:   The focus of this group is to help patients establish daily goals to achieve during treatment and discuss how the patient can incorporate goal setting into their daily lives to aide in recovery.    Participation Level:  Did Not Attend  Participation Quality:  Did Not Attend  Affect:  Did Not Attend  Cognitive:  Did Not Attend  Insight: None  Engagement in Group:  Did Not Attend  Modes of Intervention:  Did Not Attend  Additional Comments:  Did Not Attend  Cory Floyd 09/11/2024, 10:12 AM

## 2024-09-11 NOTE — [facility] Group Notes (Signed)
 Patient did not attend the Occupational Therapy group.

## 2024-09-11 NOTE — [facility] Group Notes (Signed)
 Patient did not attend the Music Therapy group.

## 2024-09-11 NOTE — [facility] Group Notes (Signed)
 [facility] Group Notes:  (Nursing/MHT/Case Management/Adjunct)  Date:  09/11/2024  Time:  9:30 PM  Type of Therapy:  Wrap-up group  Participation Level:  None  Participation Quality:  Resistant  Affect:  Appropriate  Cognitive:  Appropriate  Insight:  None  Engagement in Group:  None  Modes of Intervention:  Education  Summary of Progress/Problems: Pt attended group, didn't participate.  Grayce LITTIE Essex 09/11/2024, 9:30 PM

## 2024-09-11 NOTE — Group Note (Signed)
 LCSW Group Therapy Note   Group Date: 09/11/2024 Start Time: 1100 End Time: 1200   Participation:  did not attend  Type of Therapy:  Group Therapy  Topic:  Healing Hearts:  A Safe Space for Grief  Objective:  To create a compassionate group space where participants can process grief, learn about its stages, and explore personal rituals to honor lost loved ones--using supportive elements from CBT, DBT, and group therapy.  Goals:  1. Provide a safe and supportive space where participants feel comfortable sharing their feelings and experiences of grief without judgment.  2. Educate participants about the stages of grief and emphasize that there is no right way to grieve or a fixed timeline for healing.  3. Introduce the concept of rituals to process grief, allowing individuals to honor their loved ones in a personal and meaningful way.  Summary:  In Healing Hearts: A Safe Space for Grief, participants explored the unique and personal nature of grief, with a focus on the five stages (denial, anger, bargaining, depression, acceptance) as a flexible, non-linear process. The group introduced meaningful rituals--like lighting candles or memory walks--as tools for honoring loved ones and managing difficult emotions. Through shared experiences, participants received emotional support, practiced self-care, and reflected on gratitude, emphasizing that healing has no fixed timeline.  Therapeutic Modalities Used: - Cognitive Behavioral Therapy (CBT):   Psychoeducation on grief stages, Challenging unhelpful thoughts (e.g., "I should be over this") -  Dialectical Behavior Therapy (DBT):  Mindfulness (staying present with grief-related emotions), Distress tolerance (using rituals as grounding tools) -  Group Therapy/Supportive Elements:  Emotional validation and peer support, Encouragement of open sharing in a nonjudgmental space   Cory Floyd, LCSWA 09/11/2024  12:33 PM

## 2024-09-11 NOTE — [facility] Group Notes (Signed)
 Patient did not attend the Social Work group.

## 2024-09-11 NOTE — Plan of Care (Signed)
   Problem: Education: Goal: Emotional status will improve Outcome: Progressing

## 2024-09-11 NOTE — Plan of Care (Signed)
   Problem: Education: Goal: Emotional status will improve Outcome: Not Progressing Goal: Mental status will improve Outcome: Not Progressing Goal: Verbalization of understanding the information provided will improve Outcome: Not Progressing

## 2024-09-11 NOTE — Group Note (Signed)
 Recreation Therapy Group Note   Group Topic:Other  Group Date: 09/11/2024 Start Time: 1308 End Time: 1352 Facilitators: Tayten Bergdoll-McCall, LRT,CTRS Location: [address] Dayroom   Activity Description/Intervention: Therapeutic Drumming. Patients with peers and staff were given the opportunity to engage in a leader facilitated [facility] Group Empowerment Drumming Circle with staff from the [employer], in partnership with [employer]. Teaching laboratory technician and trained [employer], Norleen Mon leading with LRT observing and documenting intervention and pt response. This evidenced-based practice targets 7 areas of health and wellbeing in the human experience including: stress-reduction, exercise, self-expression, camaraderie/support, nurturing, spirituality, and music-making (leisure).   Goal Area(s) Addresses:  Patient will engage in pro-social way in music group.  Patient will follow directions of drum leader on the first prompt. Patient will demonstrate no behavioral issues during group.  Patient will identify if a reduction in stress level occurs as a result of participation in therapeutic drum circle.    Education: Leisure exposure, Pharmacologist, Musical expression, Discharge Planning   Affect/Mood: N/A   Participation Level: Did not attend    Clinical Observations/Individualized Feedback:      Plan: Continue to engage patient in RT group sessions 2-3x/week.   Sonnia Strong-McCall, LRT,CTRS 09/11/2024 2:55 PM

## 2024-09-11 NOTE — Progress Notes (Addendum)
(  Sleep Hours) -18.75 (Any PRNs that were needed, meds refused, or side effects to meds)-Trazodone  and pt refused Depakote .  (Any disturbances and when (visitation, over night)-non (Concerns raised by the patient)- Pt stated he did not understand why he have labs done. Pt stated he does not want to have his blood drawn because,I don't like anyone touching me. (SI/HI/AVH)-denied

## 2024-09-12 ENCOUNTER — Encounter (HOSPITAL_COMMUNITY): Payer: Self-pay

## 2024-09-12 MED ORDER — SERTRALINE HCL 25 MG PO TABS
25.0000 mg | ORAL_TABLET | Freq: Every day | ORAL | Status: DC
  Administered 2024-09-12 – 2024-09-17 (×5): 25 mg via ORAL
  Filled 2024-09-12 (×5): qty 1

## 2024-09-12 NOTE — Progress Notes (Signed)
 [facility] MD Progress Note  09/12/2024 1:01 PM Cory Floyd  MRN:  [id number] Subjective:   Cory Floyd is a 48 y.o. male  with a past psychiatric history of bipolar 1 disorder, substance-induced psychosis, stimulant use disorder (methamphetamine, cocaine, alcohol). He has multiple past psychiatric hospitalizations and an ACT team. Patient initially arrived to [facility] on 09/04/24 for hallucinations and paranoid ideation and admitted to [facility] under IVC on 11/14 for acute safety concerns and crisis stabalization.  Patient has a history of bipolar disorder as well as substance-induced psychosis.      Case was discussed in the multidisciplinary team. MAR was reviewed and patient was not compliant with medications.  Yesterday he received PRN Tylenol  and Trazodone .     Psychiatric Team made the following recommendations yesterday: -Continue Abilify  10 mg daily for mood stability and psychosis --Hold home trileptal  300mg  BID (refusing, stating he does not take at home) --Hold home remeron  15mg  at bedtime (refusing, excessively sleeping). -Start Depakote  1000 mg QHS for mood stability      On interview today patient reports he slept fair last night.  He reports his appetite is doing fair.  He reports he does have SI but does not elaborate.  He reports no HI.   He reports AH- I hear things.  He reports no VH.  He reports that he still has concerns about people from his past still being in his life when they shouldn't.  He reports no issues with his medications.  Discussed starting Zoloft  with him to address his depression.  Discussed potential risk and side effects and he was agreeable.  He reports frustration at blood work trying to be taken this morning as he states he only just took the Depakote  last night.  Discussed that this blood work was for other reasons but that it could be combined with the blood work for the Depakote  level later and he was agreeable.  Encouraged him to leave the room.  He reports  no other concerns at present.   Principal Problem: Psychoactive substance-induced psychosis (HCC) Diagnosis: Principal Problem:   Psychoactive substance-induced psychosis (HCC) Active Problems:   Bipolar affective disorder, current episode manic (HCC)   Severe stimulant use disorder (HCC)  Total Time spent with patient:  I personally spent 35 minutes on the unit in direct patient care. The direct patient care time included face-to-face time with the patient, reviewing the patient's chart, communicating with other professionals, and coordinating care.    Past Psychiatric History:  bipolar 1 disorder, substance-induced psychosis, stimulant use disorder (methamphetamine, cocaine, alcohol). He has multiple past psychiatric hospitalizations and an ACT team.   Past Medical History:  Past Medical History:  Diagnosis Date   Alcoholic (HCC)    clean for 3 years   Anginal pain    Arthritis    Bipolar 1 disorder (HCC)    COVID-19    Depressed    Diabetes mellitus without complication (HCC)    Dyspnea    Fatty liver    Hypertension    Schizophrenia (HCC)     Past Surgical History:  Procedure Laterality Date   COLONOSCOPY  02/05/2014   diverticulosis, hyperplastic polyp x 2   fracture arm Right    INCISION AND DRAINAGE ABSCESS Bilateral 12/29/2022   Procedure: INCISION AND DRAINAGE ABSCESS BILATERAL ARMS;  Surgeon: Kendal Franky SQUIBB, MD;  Location: [facility] OR;  Service: Orthopedics;  Laterality: Bilateral;   KNEE SURGERY Right 2013   NASAL SEPTUM SURGERY     NASAL  TURBINATE REDUCTION Bilateral 12/12/2016   Procedure: TURBINATE REDUCTION/SUBMUCOSAL RESECTION;  Surgeon: Chinita Hasten, MD;  Location: [facility] ORS;  Service: ENT;  Laterality: Bilateral;   Family History:  Family History  Problem Relation Age of Onset   Hepatitis C Mother    Cancer Mother    Cancer Maternal Aunt    Hypertension Maternal Grandmother    Family Psychiatric  History:  uncle and aunt who completed suicide    Social History:  Social History   Substance and Sexual Activity  Alcohol Use Yes   Comment: 1/2-5th pint of liquor     Social History   Substance and Sexual Activity  Drug Use Yes   Types: Cocaine, Marijuana   Comment: heroin    Social History   Socioeconomic History   Marital status: Divorced    Spouse name: Not on file   Number of children: Not on file   Years of education: Not on file   Highest education level: Not on file  Occupational History   Not on file  Tobacco Use   Smoking status: Every Day    Current packs/day: 1.00    Average packs/day: 1 pack/day for 20.0 years (20.0 ttl pk-yrs)    Types: Cigarettes   Smokeless tobacco: Never  Vaping Use   Vaping status: Never Used  Substance and Sexual Activity   Alcohol use: Yes    Comment: 1/2-5th pint of liquor   Drug use: Yes    Types: Cocaine, Marijuana    Comment: heroin   Sexual activity: Yes    Birth control/protection: None  Other Topics Concern   Not on file  Social History Narrative   Not on file   Social Drivers of Health   Financial Resource Strain: Not on file  Food Insecurity: Food Insecurity Present (09/05/2024)   Hunger Vital Sign    Worried About Running Out of Food in the Last Year: Often true    Ran Out of Food in the Last Year: Often true  Transportation Needs: Unmet Transportation Needs (09/05/2024)   PRAPARE - Administrator, Civil Service (Medical): Yes    Lack of Transportation (Non-Medical): Yes  Physical Activity: Not on file  Stress: Not on file  Social Connections: Not on file   Additional Social History:                         Sleep: Fair Estimated Sleeping Duration (Last 24 Hours): 8.25-10.50 hours (Due to [employer] Time, the durations displayed may not accurately represent documentation during the time change interval)  Appetite:  Fair  Current Medications: Current Facility-Administered Medications  Medication Dose Route Frequency  Provider Last Rate Last Admin   acetaminophen  (TYLENOL ) tablet 650 mg  650 mg Oral Q6H PRN Mannie Jerel PARAS, NP   650 mg at 09/11/24 2202   alum & mag hydroxide-simeth (MAALOX/MYLANTA) 200-200-20 MG/5ML suspension 30 mL  30 mL Oral Q4H PRN Mannie Jerel PARAS, NP       ARIPiprazole  (ABILIFY ) tablet 10 mg  10 mg Oral Daily Towana Leita SAILOR, MD   10 mg at 09/12/24 1000   haloperidol  (HALDOL ) tablet 5 mg  5 mg Oral TID PRN Mannie Jerel PARAS, NP       And   diphenhydrAMINE  (BENADRYL ) capsule 50 mg  50 mg Oral TID PRN Mannie Jerel PARAS, NP       haloperidol  lactate (HALDOL ) injection 10 mg  10 mg Intramuscular TID PRN Mannie Jerel PARAS,  NP       And   diphenhydrAMINE  (BENADRYL ) injection 50 mg  50 mg Intramuscular TID PRN Mannie Jerel PARAS, NP       And   LORazepam  (ATIVAN ) injection 2 mg  2 mg Intramuscular TID PRN Mannie Jerel PARAS, NP       haloperidol  lactate (HALDOL ) injection 5 mg  5 mg Intramuscular TID PRN Mannie Jerel PARAS, NP       And   diphenhydrAMINE  (BENADRYL ) injection 50 mg  50 mg Intramuscular TID PRN Mannie Jerel PARAS, NP       And   LORazepam  (ATIVAN ) injection 2 mg  2 mg Intramuscular TID PRN Mannie Jerel PARAS, NP       divalproex  (DEPAKOTE  ER) 24 hr tablet 1,000 mg  1,000 mg Oral QHS Butler, Laura N, MD   1,000 mg at 09/11/24 2202   feeding supplement (ENSURE PLUS HIGH PROTEIN) liquid 237 mL  237 mL Oral BID BM Nasia Cannan S, DO   237 mL at 09/12/24 1000   lidocaine  (LIDODERM ) 5 % 1 patch  1 patch Transdermal Daily Towana Leita SAILOR, MD   1 patch at 09/07/24 1149   magnesium  hydroxide (MILK OF MAGNESIA) suspension 30 mL  30 mL Oral Daily PRN Mannie Jerel PARAS, NP       nicotine  (NICODERM CQ  - dosed in mg/24 hours) patch 21 mg  21 mg Transdermal Daily Mannie Jerel PARAS, NP       sertraline  (ZOLOFT ) tablet 25 mg  25 mg Oral Daily Dalyce Renne S, DO   25 mg at 09/12/24 1103   sodium chloride  (OCEAN) 0.65 % nasal spray 1 spray  1 spray Each Nare BID PRN Bobbitt, Shalon E, NP   1  spray at 09/07/24 1706   traZODone  (DESYREL ) tablet 100-200 mg  100-200 mg Oral QHS PRN Mannie Jerel PARAS, NP   200 mg at 09/11/24 2202    Lab Results: No results found for this or any previous visit (from the past 48 hours).  Blood Alcohol level:  Lab Results  Component Value Date   ETH <15 09/04/2024   ETH 26 (H) 08/21/2024    Metabolic Disorder Labs: Lab Results  Component Value Date   HGBA1C 5.8 (H) 12/31/2022   MPG 120 12/31/2022   MPG 111 04/02/2021   No results found for: PROLACTIN Lab Results  Component Value Date   CHOL 149 12/31/2022   TRIG 160 (H) 12/31/2022   HDL 26 (L) 12/31/2022   CHOLHDL 5.7 12/31/2022   VLDL 32 12/31/2022   LDLCALC 91 12/31/2022   LDLCALC 122 (H) 05/04/2021    Physical Findings: AIMS:  ,  ,  ,  ,  ,  ,   CIWA:    COWS:     Musculoskeletal: Strength & Muscle Tone: within normal limits Gait & Station: normal Patient leans: N/A  Psychiatric Specialty Exam:  Presentation  General Appearance:  Casual  Eye Contact: Poor  Speech: Clear and Coherent; Normal Rate  Speech Volume: Normal  Handedness:No data recorded  Mood and Affect  Mood: Dysphoric; Irritable  Affect: Constricted; Congruent   Thought Process  Thought Processes: Linear; Coherent  Descriptions of Associations:Intact  Orientation:Full (Time, Place and Person)  Thought Content:Delusions; Paranoid Ideation  History of Schizophrenia/Schizoaffective disorder:No  Duration of Psychotic Symptoms:Less than six months  Hallucinations:Hallucinations: Auditory  Ideas of Reference:Paranoia  Suicidal Thoughts:Suicidal Thoughts: Yes, Passive  Homicidal Thoughts:Homicidal Thoughts: No   Sensorium  Memory: Immediate Fair  Judgment: Intact  Insight: Present   Art Therapist  Concentration: Fair  Attention Span: Fair  Recall: [employer] of Knowledge: Fair  Language: Fair   Psychomotor Activity  Psychomotor  Activity: Psychomotor Activity: Normal   Assets  Assets: Resilience   Sleep  Sleep: Sleep: Fair    Physical Exam: Physical Exam Vitals and nursing note reviewed.  Constitutional:      General: He is not in acute distress.    Appearance: Normal appearance. He is normal weight. He is not ill-appearing or toxic-appearing.  HENT:     Head: Normocephalic and atraumatic.  Pulmonary:     Effort: Pulmonary effort is normal.  Musculoskeletal:        General: Normal range of motion.  Neurological:     General: No focal deficit present.     Mental Status: He is alert.    Review of Systems  Respiratory:  Negative for cough and shortness of breath.   Cardiovascular:  Negative for chest pain.  Gastrointestinal:  Negative for abdominal pain, constipation, diarrhea, nausea and vomiting.  Neurological:  Negative for dizziness, weakness and headaches.  Psychiatric/Behavioral:  Positive for depression, hallucinations (AH) and suicidal ideas. The patient is not nervous/anxious.    Blood pressure (!) 102/56, pulse 62, temperature 97.6 F (36.4 C), temperature source Oral, resp. rate 20, height 5' 10 (1.778 m), weight 90.7 kg, SpO2 98%. Body mass index is 28.7 kg/m.   Treatment Plan Summary: Daily contact with patient to assess and evaluate symptoms and progress in treatment and Medication management  Cory Floyd is a 48 y.o. male  with a past psychiatric history of bipolar 1 disorder, substance-induced psychosis, stimulant use disorder (methamphetamine, cocaine, alcohol). He has multiple past psychiatric hospitalizations and an ACT team. Patient initially arrived to [facility] on 09/04/24 for hallucinations and paranoid ideation and admitted to [facility] under IVC on 11/14 for acute safety concerns and crisis stabalization.  Patient has a history of bipolar disorder as well as substance-induced psychosis. On this admission he has presented with paranoia, was intermittently responding to internal  stimuli, and disorganized in his thought process. Upon arrival, he remained paranoid and uncooperative on exam, demonstrating overt psychotic symptoms and IVC was continued. Home medications (abilify , mirtazapine  and tegretol ) were continued.      Cory Floyd is opening up more now that he has been taking his medication.  He is reporting depression and SI so we will start Zoloft .  We will schedule his blood work (lipid panel, A1c, Depakote  Lvl) for Sunday.  We will not make any changes to his medications at this time.  We will continue to monitor.      Diagnoses / Active Problems: Substance-induced psychosis  Bipolar 1 Disorder Stimulant use disorder    PLAN: Safety and Monitoring:            --  INVOLUNTARY  admission to inpatient psychiatric unit for safety, stabilization and treatment            -- Daily contact with patient to assess and evaluate symptoms and progress in treatment            -- Patient's case to be discussed in multi-disciplinary team meeting            -- Observation Level : q15 minute checks            -- Vital signs:  q12 hours            -- Precautions: suicide, elopement, and assault  2. Psychiatric Diagnoses and Treatment:  -Continue Abilify  10 mg daily for mood stability and psychosis -Start Zoloft  25 mg daily for depression -Continue Depakote  1000 mg QHS for mood stability              --Hold home trileptal  300mg  BID (refusing, stating he does not take at home)            --Hold home remeron  15mg  at bedtime (refusing, excessively sleeping).                           -- Metabolic profile and EKG monitoring obtained while on an atypical antipsychotic  BMI: 28.7 TSH: WNL Most recent QTC (11/14) of 434     3. Medical Issues Being Addressed:  --none, patient has history of rhabdomyolysis and cellulitis    4. Discharge Planning:             -- Social work and case management to assist with discharge planning and identification of hospital follow-up needs  prior to discharge            -- Estimated LOS: 3-5 days            -- Discharge Concerns: Need to establish a safety plan; Medication compliance and effectiveness            -- Discharge Goals: Return home with outpatient referrals for mental health follow-up including medication management/psychotherapy     Marsa GORMAN Rosser, DO 09/12/2024, 1:01 PM

## 2024-09-12 NOTE — [facility] Group Notes (Signed)
 Adult Psychoeducational Group Note  Date:  09/12/2024 Time:  12:06 PM  Group Topic/Focus: Recreational Therapy   Participation Level:  Did not attend  Cory Floyd 09/12/2024, 12:06 PM

## 2024-09-12 NOTE — Group Note (Signed)
 Recreation Therapy Group Note   Group Topic:Problem Solving  Group Date: 09/12/2024 Start Time: 1005 End Time: 1030 Facilitators: Darshana Curnutt-McCall, LRT,CTRS Location: [address] Dayroom   Group Topic: Communication, Team Building, Problem Solving  Goal Area(s) Addresses:  Patient will effectively work with peer towards shared goal.  Patient will identify skills used to make activity successful.  Patient will identify how skills used during activity can be applied to reach post d/c goals.   Behavioral Response:   Intervention: STEM Activity- Glass Blower/designer  Activity: Tallest [employer]. In teams of 5-6, patients were given 11 craft pipe cleaners. Using the materials provided, patients were instructed to compete again the opposing team(s) to build the tallest free-standing structure from floor level. The activity was timed; difficulty increased by clinical research associate as production designer, theatre/television/film continued.  Systematically resources were removed with additional directions for example, placing one arm behind their back, working in silence, and shape stipulations. LRT facilitated post-activity discussion reviewing team processes and necessary communication skills involved in completion. Patients were encouraged to reflect how the skills utilized, or not utilized, in this activity can be incorporated to positively impact support systems post discharge.  Education: Pharmacist, Community, Scientist, Physiological, Discharge Planning   Education Outcome: Acknowledges education/In group clarification offered/Needs additional education.    Affect/Mood: N/A   Participation Level: Did not attend    Clinical Observations/Individualized Feedback:      Plan: Continue to engage patient in RT group sessions 2-3x/week.   Stehanie Ekstrom-McCall, LRT,CTRS 09/12/2024 12:28 PM

## 2024-09-12 NOTE — Progress Notes (Signed)
   09/12/24 1000  Psych Admission Type (Psych Patients Only)  Admission Status Involuntary  Psychosocial Assessment  Patient Complaints Depression;Anxiety  Eye Contact Brief  Facial Expression Flat  Affect Flat  Speech Slow  Interaction Minimal;Isolative  Motor Activity Slow  Appearance/Hygiene Unremarkable  Behavior Characteristics Unwilling to participate;Calm  Mood Depressed  Thought Process  Coherency WDL  Content Blaming others  Delusions None reported or observed  Perception Hallucinations  Hallucination Auditory;Visual (reported by patient, not observed by this RN)  Judgment Poor  Confusion None  Danger to Self  Current suicidal ideation? Denies  Agreement Not to Harm Self Yes  Description of Agreement verbal  Danger to Others  Danger to Others None reported or observed

## 2024-09-12 NOTE — Plan of Care (Signed)
   Problem: Education: Goal: Emotional status will improve Outcome: Progressing Goal: Mental status will improve Outcome: Progressing

## 2024-09-12 NOTE — Progress Notes (Signed)
 Writer explained to pt he had scheduled labs this morning. Pt stated,I'm not letting anyone poke me. Writer educated pt on the importance of treatment adherence. Pt stated , I don't care, why do you keep waking me up early.

## 2024-09-12 NOTE — [facility] Group Notes (Signed)
 [facility] Group Notes:  (Nursing/MHT/Case Management/Adjunct)  Date:  09/12/2024  Time:  8:21 PM  Type of Therapy:  AA Group  Participation Level:  Did Not Attend  Participation Quality:    Affect:    Cognitive:    Insight:    Engagement in Group:    Modes of Intervention:    Summary of Progress/Problems:  Cory Floyd 09/12/2024, 8:21 PM

## 2024-09-12 NOTE — Progress Notes (Signed)
(  Sleep Hours) -16.75 (Any PRNs that were needed, meds refused, or side effects to meds)- Tylenol   (Any disturbances and when (visitation, over night)-none (Concerns raised by the patient)- when writer was educating pt about room lock out pt stated he would hold out longer then his Doctor when it comes to complying with his care.Pt stated he stay in his room due to having chronic back pain. (SI/HI/AVH)-denied

## 2024-09-12 NOTE — [facility] Group Notes (Signed)
 Adult Psychoeducational Group Note  Date:  09/12/2024 Time:  10:53 AM  Group Topic/Focus: Goals Group   Participation Level:  Did Not Attend  Participation Quality:

## 2024-09-12 NOTE — Group Note (Signed)
 Date:  09/12/2024 Time:  3:43 PM  Group Topic/Focus: Sleep Hygiene Dimensions of Wellness:   The focus of this group is to introduce the topic of wellness and discuss the role each dimension of wellness plays in total health.    Participation Level:  Did Not Attend   Shanda JONETTA Challenger 09/12/2024, 3:43 PM

## 2024-09-12 NOTE — BH IP Treatment Plan (Signed)
 Interdisciplinary Treatment and Diagnostic Plan Update  09/12/2024 Time of Session: 11:05 AM - UPDATE Cory Floyd MRN: [id number]  Principal Diagnosis: Psychoactive substance-induced psychosis (HCC)  Secondary Diagnoses: Principal Problem:   Psychoactive substance-induced psychosis (HCC) Active Problems:   Bipolar affective disorder, current episode manic (HCC)   Severe stimulant use disorder (HCC)   Current Medications:  Current Facility-Administered Medications  Medication Dose Route Frequency Provider Last Rate Last Admin   acetaminophen  (TYLENOL ) tablet 650 mg  650 mg Oral Q6H PRN Mannie Jerel PARAS, NP   650 mg at 09/11/24 2202   alum & mag hydroxide-simeth (MAALOX/MYLANTA) 200-200-20 MG/5ML suspension 30 mL  30 mL Oral Q4H PRN Mannie Jerel PARAS, NP       ARIPiprazole  (ABILIFY ) tablet 10 mg  10 mg Oral Daily Butler, Laura N, MD   10 mg at 09/12/24 1000   haloperidol  (HALDOL ) tablet 5 mg  5 mg Oral TID PRN Mannie Jerel PARAS, NP       And   diphenhydrAMINE  (BENADRYL ) capsule 50 mg  50 mg Oral TID PRN Mannie Jerel PARAS, NP       haloperidol  lactate (HALDOL ) injection 10 mg  10 mg Intramuscular TID PRN Mannie Jerel PARAS, NP       And   diphenhydrAMINE  (BENADRYL ) injection 50 mg  50 mg Intramuscular TID PRN Mannie Jerel PARAS, NP       And   LORazepam  (ATIVAN ) injection 2 mg  2 mg Intramuscular TID PRN Mannie Jerel PARAS, NP       haloperidol  lactate (HALDOL ) injection 5 mg  5 mg Intramuscular TID PRN Mannie Jerel PARAS, NP       And   diphenhydrAMINE  (BENADRYL ) injection 50 mg  50 mg Intramuscular TID PRN Mannie Jerel PARAS, NP       And   LORazepam  (ATIVAN ) injection 2 mg  2 mg Intramuscular TID PRN Mannie Jerel PARAS, NP       divalproex  (DEPAKOTE  ER) 24 hr tablet 1,000 mg  1,000 mg Oral QHS Towana Leita SAILOR, MD   1,000 mg at 09/11/24 2202   feeding supplement (ENSURE PLUS HIGH PROTEIN) liquid 237 mL  237 mL Oral BID BM Pashayan, Marsa RAMAN, DO   237 mL at 09/12/24 1000   lidocaine   (LIDODERM ) 5 % 1 patch  1 patch Transdermal Daily Towana Leita SAILOR, MD   1 patch at 09/07/24 1149   magnesium  hydroxide (MILK OF MAGNESIA) suspension 30 mL  30 mL Oral Daily PRN Mannie Jerel PARAS, NP       nicotine  (NICODERM CQ  - dosed in mg/24 hours) patch 21 mg  21 mg Transdermal Daily Mannie Jerel PARAS, NP       sertraline  (ZOLOFT ) tablet 25 mg  25 mg Oral Daily Pashayan, Alexander S, DO   25 mg at 09/12/24 1103   sodium chloride  (OCEAN) 0.65 % nasal spray 1 spray  1 spray Each Nare BID PRN Bobbitt, Shalon E, NP   1 spray at 09/07/24 1706   traZODone  (DESYREL ) tablet 100-200 mg  100-200 mg Oral QHS PRN Mannie Jerel PARAS, NP   200 mg at 09/11/24 2202   PTA Medications: Medications Prior to Admission  Medication Sig Dispense Refill Last Dose/Taking   acetaminophen  (TYLENOL ) 500 MG tablet Take 500-1,500 mg by mouth every 6 (six) hours as needed for moderate pain (pain score 4-6) (pt states he has rheumatoid arthritis).      ARIPiprazole  (ABILIFY ) 20 MG tablet Take 20 mg by mouth at bedtime.  aspirin-acetaminophen -caffeine (EXCEDRIN EXTRA STRENGTH) 250-250-65 MG tablet Take 2 tablets by mouth every 6 (six) hours as needed for headache.      ibuprofen  (ADVIL ) 200 MG tablet Take 400 mg by mouth every 6 (six) hours as needed for moderate pain (pain score 4-6).      lidocaine  (XYLOCAINE ) 5 % ointment Apply 1 Application topically as needed. 35.44 g 0    mirtazapine  (REMERON ) 15 MG tablet Take 15 mg by mouth at bedtime.      naproxen  sodium (ALEVE ) 220 MG tablet Take 440 mg by mouth 2 (two) times daily as needed (rheumatoid arthritis per patient).      Oxcarbazepine  (TRILEPTAL ) 300 MG tablet Take 300 mg by mouth 2 (two) times daily.      traZODone  (DESYREL ) 100 MG tablet Take 100-200 mg by mouth at bedtime as needed for sleep. Take one-two tablets (100mg -200mg ) by mouth at bedtime as needed.       Patient Stressors: Financial difficulties   Medication change or noncompliance   Substance abuse     Patient Strengths: Capable of independent living   Treatment Modalities: Medication Management, Group therapy, Case management,  1 to 1 session with clinician, Psychoeducation, Recreational therapy.   Physician Treatment Plan for Primary Diagnosis: Psychoactive substance-induced psychosis (HCC) Long Term Goal(s): Improvement in symptoms so as ready for discharge   Short Term Goals: Ability to identify changes in lifestyle to reduce recurrence of condition will improve Ability to verbalize feelings will improve Ability to demonstrate self-control will improve Ability to identify and develop effective coping behaviors will improve Ability to maintain clinical measurements within normal limits will improve Compliance with prescribed medications will improve Ability to identify triggers associated with substance abuse/mental health issues will improve  Medication Management: Evaluate patient's response, side effects, and tolerance of medication regimen.  Therapeutic Interventions: 1 to 1 sessions, Unit Group sessions and Medication administration.  Evaluation of Outcomes: Progressing  Physician Treatment Plan for Secondary Diagnosis: Principal Problem:   Psychoactive substance-induced psychosis (HCC) Active Problems:   Bipolar affective disorder, current episode manic (HCC)   Severe stimulant use disorder (HCC)  Long Term Goal(s): Improvement in symptoms so as ready for discharge   Short Term Goals: Ability to identify changes in lifestyle to reduce recurrence of condition will improve Ability to verbalize feelings will improve Ability to demonstrate self-control will improve Ability to identify and develop effective coping behaviors will improve Ability to maintain clinical measurements within normal limits will improve Compliance with prescribed medications will improve Ability to identify triggers associated with substance abuse/mental health issues will improve      Medication Management: Evaluate patient's response, side effects, and tolerance of medication regimen.  Therapeutic Interventions: 1 to 1 sessions, Unit Group sessions and Medication administration.  Evaluation of Outcomes: Progressing   RN Treatment Plan for Primary Diagnosis: Psychoactive substance-induced psychosis (HCC) Long Term Goal(s): Knowledge of disease and therapeutic regimen to maintain health will improve  Short Term Goals: Ability to remain free from injury will improve, Ability to verbalize frustration and anger appropriately will improve, Ability to verbalize feelings will improve, and Ability to disclose and discuss suicidal ideas  Medication Management: RN will administer medications as ordered by provider, will assess and evaluate patient's response and provide education to patient for prescribed medication. RN will report any adverse and/or side effects to prescribing provider.  Therapeutic Interventions: 1 on 1 counseling sessions, Psychoeducation, Medication administration, Evaluate responses to treatment, Monitor vital signs and CBGs as ordered, Perform/monitor CIWA, COWS, AIMS  and Fall Risk screenings as ordered, Perform wound care treatments as ordered.  Evaluation of Outcomes: Progressing   LCSW Treatment Plan for Primary Diagnosis: Psychoactive substance-induced psychosis (HCC) Long Term Goal(s): Safe transition to appropriate next level of care at discharge, Engage patient in therapeutic group addressing interpersonal concerns.  Short Term Goals: Engage patient in aftercare planning with referrals and resources, Increase ability to appropriately verbalize feelings, Facilitate acceptance of mental health diagnosis and concerns, and Identify triggers associated with mental health/substance abuse issues  Therapeutic Interventions: Assess for all discharge needs, 1 to 1 time with Social worker, Explore available resources and support systems, Assess for adequacy in  community support network, Educate family and significant other(s) on suicide prevention, Complete Psychosocial Assessment, Interpersonal group therapy.  Evaluation of Outcomes: Progressing   Progress in Treatment: Attending groups: No. Participating in groups: No. Taking medication as prescribed: Yes. Toleration medication: Yes  Family/Significant other contact made: No, will contact:  consents are pending Patient understands diagnosis: No. Discussing patient identified problems/goals with staff: No. Medical problems stabilized or resolved: Yes. Denies suicidal/homicidal ideation: Yes. Issues/concerns per patient self-inventory: No. None reported.   New problem(s) identified: No, Describe:  None identified.   New Short Term/Long Term Goal(s): detox, medication management for mood stabilization; elimination of SI thoughts; development of comprehensive mental wellness/sobriety plan    Patient Goals: Patient refused to participate.    Discharge Plan or Barriers: Patient recently admitted. CSW will continue to follow and assess for appropriate referrals and possible discharge planning.     Reason for Continuation of Hospitalization: Delusions  Mania Medication stabilization Withdrawal symptoms   Estimated Length of Stay:  4 - 6 days  Last 3 [place] Suicide Severity Risk Score: Flowsheet Row Ad[facility] (Current) [facility] from 09[facility]04/2024 in [facility] at [name] ED from 08/21/2024 in [facility] Department at [facility]  C-SSRS RISK CATEGORY No Risk No Risk No Risk    Last [facility] 2/9 Scores:     No data to display          Scribe for Treatment Team: Cheynne Virden O Jadyn Barge, LCSWA 09/12/2024 2:37 PM

## 2024-09-12 NOTE — Plan of Care (Signed)
  Problem: Education: Goal: Knowledge of [facility] Education information/materials will improve Outcome: Progressing Goal: Emotional status will improve Outcome: Progressing Goal: Mental status will improve Outcome: Progressing Goal: Verbalization of understanding the information provided will improve Outcome: Progressing   Problem: Coping: Goal: Ability to demonstrate self-control will improve Outcome: Progressing   Problem: Safety: Goal: Periods of time without injury will increase Outcome: Progressing   Problem: Activity: Goal: Interest or engagement in activities will improve Outcome: Not Progressing

## 2024-09-12 NOTE — [facility] Group Notes (Signed)
 Patient did not attend the Social Wellness group.

## 2024-09-13 DIAGNOSIS — F1590 Other stimulant use, unspecified, uncomplicated: Secondary | ICD-10-CM

## 2024-09-13 NOTE — Group Note (Signed)
 Date:  09/13/2024 Time:  10:41 AM  Group Topic/Focus: Social Wellness  Patients participated in a Social Wellness Group focused on themes of control and shared experience. The group engaged in a collaborative drawing activity in which they added to each other's drawings every two minutes and later shared the final results. Discussion centered on the feelings associated with having control, losing control, and sharing control. Patients were educated on the normalcy of experiencing discomfort when control is lost, and the group explored ways to reframe these experiences positively, using the activity as an example. Patients were engaged and demonstrated insight during the discussion.    Participation Level:  Did Not Attend  Participation Quality:  N/A  Affect:  N/A  Cognitive:  N/A  Insight: None  Engagement in Group:  None  Modes of Intervention:  N/A  Additional Comments:  Cory Floyd did not attend this social wellness group.  Cory Floyd [name] 09/13/2024, 10:41 AM

## 2024-09-13 NOTE — Plan of Care (Signed)
  Problem: Activity: Goal: Sleeping patterns will improve Outcome: Progressing   

## 2024-09-13 NOTE — Progress Notes (Signed)
(  Sleep Hours) - (Any PRNs that were needed, meds refused, or side effects to meds)-  (Any disturbances and when (visitation, over night)- (Concerns raised by the patient)-  (SI/HI/AVH)-

## 2024-09-13 NOTE — Group Note (Signed)
 Date:  09/13/2024 Time:  2:25 PM  Group Topic/Focus: Karaoke  Patients participated in a karaoke-based therapeutic recreation group, singing both individually and together. The activity promoted peer support, confidence-building, social engagement, and positive group cohesion, with participants encouraging one another throughout the session.    Participation Level:  Did Not Attend  Participation Quality:  N/A  Affect:  N/A  Cognitive:  N/A  Insight: None  Engagement in Group:  None  Modes of Intervention:  N/A  Additional Comments:  Cory Floyd did not attend karaoke.  [name] 09/13/2024, 2:25 PM

## 2024-09-13 NOTE — Group Note (Signed)
 Date:  09/13/2024 Time:  10:28 AM  Group Topic/Focus: Goals Group  Goals Group focused on self-reflection and future planning. The group began with an icebreaker in which members shared their favorite foods and identified personal strengths using a provided list of positive traits. Patients then discussed their short- and long-term goals, including 1-week, 49-month, 1-year, and 5-year objectives. Participants identified potential obstacles to achieving these goals and explored practical steps they could take if such barriers arise.   Participation Level:  Did Not Attend  Participation Quality:  N/A  Affect:  N/A  Cognitive:  N/A  Insight: None  Engagement in Group:  None  Modes of Intervention:  N/A  Additional Comments:  Cory Floyd did not attend goals group.  Cory Floyd HERO Cory Floyd 09/13/2024, 10:28 AM

## 2024-09-13 NOTE — Plan of Care (Signed)
   Problem: Education: Goal: Knowledge of [facility] Education information/materials will improve Outcome: Progressing Goal: Emotional status will improve Outcome: Progressing Goal: Mental status will improve Outcome: Progressing Goal: Verbalization of understanding the information provided will improve Outcome: Progressing

## 2024-09-13 NOTE — Progress Notes (Signed)
   09/13/24 1300  Psych Admission Type (Psych Patients Only)  Admission Status Involuntary  Psychosocial Assessment  Patient Complaints Depression  Eye Contact Avertive  Facial Expression Flat  Affect Flat  Speech Logical/coherent  Interaction Isolative  Motor Activity Slow  Appearance/Hygiene Unremarkable  Behavior Characteristics Unwilling to participate  Mood Depressed  Thought Process  Coherency WDL  Content Blaming others  Delusions None reported or observed  Perception WDL  Hallucination None reported or observed  Judgment Poor  Confusion None  Danger to Self  Current suicidal ideation? Denies  Agreement Not to Harm Self Yes  Description of Agreement Verbal  Danger to Others  Danger to Others None reported or observed

## 2024-09-13 NOTE — [facility] Group Notes (Signed)
 [name] did not attend wrap up group

## 2024-09-13 NOTE — Group Note (Signed)
 Date:  09/13/2024 Time:  2:19 PM  Group Topic/Focus: Physical Wellness  Patients learned the mechanisms and benefits of deep-breathing techniques and practiced guided breathing exercises. The group engaged in stretching routines and followed along with music-based movement activities to encourage circulation and promote overall physical activation. The session concluded with participants seated and performing chair-based yoga to support relaxation and gentle mobility    Participation Level:  Did Not Attend  Participation Quality:  N/A  Affect:  N/A  Cognitive:  N/A  Insight: None  Engagement in Group:  None  Modes of Intervention:  N/A  Additional Comments:  Cory Floyd did not attend this physical wellness group.  [name] 09/13/2024, 2:19 PM

## 2024-09-13 NOTE — Progress Notes (Signed)
 Patients blood pressure was low, he denies any dizziness or lightheaded. Gatorade offered and he reports he is going to breakfast and feels fine.

## 2024-09-13 NOTE — Progress Notes (Signed)
   09/12/24 2155  Psych Admission Type (Psych Patients Only)  Admission Status Involuntary  Psychosocial Assessment  Patient Complaints Depression  Eye Contact Brief  Facial Expression Flat  Affect Flat  Speech Logical/coherent  Interaction Isolative  Motor Activity Slow  Appearance/Hygiene Unremarkable  Behavior Characteristics Unwilling to participate  Mood Depressed  Thought Process  Coherency WDL  Content Blaming others  Delusions None reported or observed  Perception WDL  Hallucination None reported or observed  Judgment Poor  Confusion None  Danger to Self  Current suicidal ideation? Denies  Danger to Others  Danger to Others None reported or observed

## 2024-09-13 NOTE — Progress Notes (Signed)
 [facility] MD Progress Note  09/13/2024 9:13 AM Cory Floyd  MRN:  [id number] Subjective:   Cory Floyd is a 48 y.o. male  with a past psychiatric history of bipolar 1 disorder, substance-induced psychosis, stimulant use disorder (methamphetamine, cocaine, alcohol). He has multiple past psychiatric hospitalizations and an ACT team. Patient initially arrived to [facility] on 09/04/24 for hallucinations and paranoid ideation and admitted to [facility] under IVC on 11/14 for acute safety concerns and crisis stabalization.  Patient has a history of bipolar disorder as well as substance-induced psychosis.      Case was discussed in the multidisciplinary team. MAR was reviewed and patient was not compliant with medications.  He received PRN Trazodone  last night.      Psychiatric Team made the following recommendations yesterday: -Continue Abilify  10 mg daily for mood stability and psychosis -Start Zoloft  25 mg daily for depression -Continue Depakote  1000 mg QHS for mood stability      On interview today patient reports he slept poor last night due to his back pain.  He reports his appetite is doing fair.  He reports he continues to have passive SI because he is tired of the pain.  He reports no HI or VH.  He reports he is continuing to hear voices.  He reports no issues with his medications.  He reports that he did have some dizziness this morning and last night.  He reports he continues to have racing thoughts.  Thanked him for taking his medication and encouraged him to continue doing so as this should help with his symptoms.  He reports no other concerns at present.    Principal Problem: Psychoactive substance-induced psychosis (HCC) Diagnosis: Principal Problem:   Psychoactive substance-induced psychosis (HCC) Active Problems:   Bipolar affective disorder, current episode manic (HCC)   Severe stimulant use disorder (HCC)  Total Time spent with patient:  I personally spent 35 minutes on the unit in  direct patient care. The direct patient care time included face-to-face time with the patient, reviewing the patient's chart, communicating with other professionals, and coordinating care.    Past Psychiatric History:  bipolar 1 disorder, substance-induced psychosis, stimulant use disorder (methamphetamine, cocaine, alcohol). He has multiple past psychiatric hospitalizations and an ACT team.   Past Medical History:  Past Medical History:  Diagnosis Date   Alcoholic (HCC)    clean for 3 years   Anginal pain    Arthritis    Bipolar 1 disorder (HCC)    COVID-19    Depressed    Diabetes mellitus without complication (HCC)    Dyspnea    Fatty liver    Hypertension    Schizophrenia (HCC)     Past Surgical History:  Procedure Laterality Date   COLONOSCOPY  02/05/2014   diverticulosis, hyperplastic polyp x 2   fracture arm Right    INCISION AND DRAINAGE ABSCESS Bilateral 12/29/2022   Procedure: INCISION AND DRAINAGE ABSCESS BILATERAL ARMS;  Surgeon: Kendal Franky SQUIBB, MD;  Location: [facility] OR;  Service: Orthopedics;  Laterality: Bilateral;   KNEE SURGERY Right 2013   NASAL SEPTUM SURGERY     NASAL TURBINATE REDUCTION Bilateral 12/12/2016   Procedure: TURBINATE REDUCTION/SUBMUCOSAL RESECTION;  Surgeon: Chinita Hasten, MD;  Location: [facility] ORS;  Service: ENT;  Laterality: Bilateral;   Family History:  Family History  Problem Relation Age of Onset   Hepatitis C Mother    Cancer Mother    Cancer Maternal Aunt    Hypertension Maternal Grandmother    Family  Psychiatric  History:  uncle and aunt who completed suicide   Social History:  Social History   Substance and Sexual Activity  Alcohol Use Yes   Comment: 1/2-5th pint of liquor     Social History   Substance and Sexual Activity  Drug Use Yes   Types: Cocaine, Marijuana   Comment: heroin    Social History   Socioeconomic History   Marital status: Divorced    Spouse name: Not on file   Number of children: Not on file    Years of education: Not on file   Highest education level: Not on file  Occupational History   Not on file  Tobacco Use   Smoking status: Every Day    Current packs/day: 1.00    Average packs/day: 1 pack/day for 20.0 years (20.0 ttl pk-yrs)    Types: Cigarettes   Smokeless tobacco: Never  Vaping Use   Vaping status: Never Used  Substance and Sexual Activity   Alcohol use: Yes    Comment: 1/2-5th pint of liquor   Drug use: Yes    Types: Cocaine, Marijuana    Comment: heroin   Sexual activity: Yes    Birth control/protection: None  Other Topics Concern   Not on file  Social History Narrative   Not on file   Social Drivers of Health   Financial Resource Strain: Not on file  Food Insecurity: Food Insecurity Present (09/05/2024)   Hunger Vital Sign    Worried About Running Out of Food in the Last Year: Often true    Ran Out of Food in the Last Year: Often true  Transportation Needs: Unmet Transportation Needs (09/05/2024)   PRAPARE - Administrator, Civil Service (Medical): Yes    Lack of Transportation (Non-Medical): Yes  Physical Activity: Not on file  Stress: Not on file  Social Connections: Not on file   Additional Social History:                         Sleep: Fair Estimated Sleeping Duration (Last 24 Hours): 6.25-7.75 hours (Due to [employer] Time, the durations displayed may not accurately represent documentation during the time change interval)  Appetite:  Fair  Current Medications: Current Facility-Administered Medications  Medication Dose Route Frequency Provider Last Rate Last Admin   acetaminophen  (TYLENOL ) tablet 650 mg  650 mg Oral Q6H PRN Mannie Jerel PARAS, NP   650 mg at 09/11/24 2202   alum & mag hydroxide-simeth (MAALOX/MYLANTA) 200-200-20 MG/5ML suspension 30 mL  30 mL Oral Q4H PRN Mannie Jerel PARAS, NP       ARIPiprazole  (ABILIFY ) tablet 10 mg  10 mg Oral Daily Towana Leita SAILOR, MD   10 mg at 09/12/24 1000   haloperidol   (HALDOL ) tablet 5 mg  5 mg Oral TID PRN Mannie Jerel PARAS, NP       And   diphenhydrAMINE  (BENADRYL ) capsule 50 mg  50 mg Oral TID PRN Mannie Jerel PARAS, NP       haloperidol  lactate (HALDOL ) injection 10 mg  10 mg Intramuscular TID PRN Mannie Jerel PARAS, NP       And   diphenhydrAMINE  (BENADRYL ) injection 50 mg  50 mg Intramuscular TID PRN Mannie Jerel PARAS, NP       And   LORazepam  (ATIVAN ) injection 2 mg  2 mg Intramuscular TID PRN Mannie Jerel PARAS, NP       haloperidol  lactate (HALDOL ) injection 5 mg  5 mg  Intramuscular TID PRN Mannie Jerel PARAS, NP       And   diphenhydrAMINE  (BENADRYL ) injection 50 mg  50 mg Intramuscular TID PRN Mannie Jerel PARAS, NP       And   LORazepam  (ATIVAN ) injection 2 mg  2 mg Intramuscular TID PRN Mannie Jerel PARAS, NP       divalproex  (DEPAKOTE  ER) 24 hr tablet 1,000 mg  1,000 mg Oral QHS Butler, Laura N, MD   1,000 mg at 09/12/24 2152   feeding supplement (ENSURE PLUS HIGH PROTEIN) liquid 237 mL  237 mL Oral BID BM Mariana Wiederholt S, DO   237 mL at 09/12/24 1000   lidocaine  (LIDODERM ) 5 % 1 patch  1 patch Transdermal Daily Towana Leita SAILOR, MD   1 patch at 09/07/24 1149   magnesium  hydroxide (MILK OF MAGNESIA) suspension 30 mL  30 mL Oral Daily PRN Mannie Jerel PARAS, NP       nicotine  (NICODERM CQ  - dosed in mg/24 hours) patch 21 mg  21 mg Transdermal Daily Mannie Jerel PARAS, NP       sertraline  (ZOLOFT ) tablet 25 mg  25 mg Oral Daily Kirt Chew S, DO   25 mg at 09/12/24 1103   sodium chloride  (OCEAN) 0.65 % nasal spray 1 spray  1 spray Each Nare BID PRN Bobbitt, Shalon E, NP   1 spray at 09/07/24 1706   traZODone  (DESYREL ) tablet 100-200 mg  100-200 mg Oral QHS PRN Mannie Jerel PARAS, NP   200 mg at 09/12/24 2152    Lab Results: No results found for this or any previous visit (from the past 48 hours).  Blood Alcohol level:  Lab Results  Component Value Date   ETH <15 09/04/2024   ETH 26 (H) 08/21/2024    Metabolic Disorder Labs: Lab Results   Component Value Date   HGBA1C 5.8 (H) 12/31/2022   MPG 120 12/31/2022   MPG 111 04/02/2021   No results found for: PROLACTIN Lab Results  Component Value Date   CHOL 149 12/31/2022   TRIG 160 (H) 12/31/2022   HDL 26 (L) 12/31/2022   CHOLHDL 5.7 12/31/2022   VLDL 32 12/31/2022   LDLCALC 91 12/31/2022   LDLCALC 122 (H) 05/04/2021    Physical Findings: AIMS:  ,  ,  ,  ,  ,  ,   CIWA:    COWS:     Musculoskeletal: Strength & Muscle Tone: within normal limits Gait & Station: normal Patient leans: N/A  Psychiatric Specialty Exam:  Presentation  General Appearance:  Casual  Eye Contact: Fair  Speech: Clear and Coherent; Normal Rate  Speech Volume: Normal  Handedness:No data recorded  Mood and Affect  Mood: Dysphoric  Affect: Congruent   Thought Process  Thought Processes: Linear; Coherent  Descriptions of Associations:Intact  Orientation:Full (Time, Place and Person)  Thought Content:Delusions  History of Schizophrenia/Schizoaffective disorder:No  Duration of Psychotic Symptoms:Less than six months  Hallucinations:Hallucinations: Auditory  Ideas of Reference:Paranoia  Suicidal Thoughts:Suicidal Thoughts: Yes, Passive  Homicidal Thoughts:Homicidal Thoughts: No   Sensorium  Memory: Immediate Fair  Judgment: Intact  Insight: Present   Executive Functions  Concentration: Fair  Attention Span: Fair  Recall: [employer] of Knowledge: Fair  Language: Fair   Psychomotor Activity  Psychomotor Activity: Psychomotor Activity: Normal   Assets  Assets: Resilience   Sleep  Sleep: Sleep: Poor (due to back pain)    Physical Exam: Physical Exam Vitals and nursing note reviewed.  Constitutional:  General: He is not in acute distress.    Appearance: Normal appearance. He is normal weight. He is not ill-appearing or toxic-appearing.  HENT:     Head: Normocephalic and atraumatic.  Pulmonary:     Effort:  Pulmonary effort is normal.  Musculoskeletal:        General: Normal range of motion.  Neurological:     General: No focal deficit present.     Mental Status: He is alert.    Review of Systems  Respiratory:  Negative for cough and shortness of breath.   Cardiovascular:  Negative for chest pain.  Gastrointestinal:  Negative for abdominal pain, constipation, diarrhea, nausea and vomiting.  Neurological:  Negative for dizziness, weakness and headaches.  Psychiatric/Behavioral:  Positive for depression, hallucinations and suicidal ideas. The patient is not nervous/anxious.    Blood pressure (!) 90/54, pulse 82, temperature 97.7 F (36.5 C), temperature source Oral, resp. rate 16, height 5' 10 (1.778 m), weight 90.7 kg, SpO2 99%. Body mass index is 28.7 kg/m.   Treatment Plan Summary: Daily contact with patient to assess and evaluate symptoms and progress in treatment and Medication management  Cory Floyd is a 48 y.o. male  with a past psychiatric history of bipolar 1 disorder, substance-induced psychosis, stimulant use disorder (methamphetamine, cocaine, alcohol). He has multiple past psychiatric hospitalizations and an ACT team. Patient initially arrived to [facility] on 09/04/24 for hallucinations and paranoid ideation and admitted to [facility] under IVC on 11/14 for acute safety concerns and crisis stabalization.  Patient has a history of bipolar disorder as well as substance-induced psychosis. On this admission he has presented with paranoia, was intermittently responding to internal stimuli, and disorganized in his thought process. Upon arrival, he remained paranoid and uncooperative on exam, demonstrating overt psychotic symptoms and IVC was continued. Home medications (abilify , mirtazapine  and tegretol ) were continued.      Uzziah has been taking his medications for a few days and is showing improvement as he is opening up more during interviews.  He is reporting some dizziness so due to this and  his improvement we will not make any changes to his medications at this time.  We will continue to monitor.     Diagnoses / Active Problems: Substance-induced psychosis  Bipolar 1 Disorder Stimulant use disorder    PLAN: Safety and Monitoring:            --  INVOLUNTARY  admission to inpatient psychiatric unit for safety, stabilization and treatment            -- Daily contact with patient to assess and evaluate symptoms and progress in treatment            -- Patient's case to be discussed in multi-disciplinary team meeting            -- Observation Level : q15 minute checks            -- Vital signs:  q12 hours            -- Precautions: suicide, elopement, and assault    2. Psychiatric Diagnoses and Treatment:  -Continue Abilify  10 mg daily for mood stability and psychosis -Continue Zoloft  25 mg daily for depression -Continue Depakote  1000 mg QHS for mood stability                                    -- Metabolic profile and EKG monitoring obtained  while on an atypical antipsychotic  BMI: 28.7 TSH: WNL Most recent QTC (11/14) of 434     3. Medical Issues Being Addressed:  --none, patient has history of rhabdomyolysis and cellulitis    4. Discharge Planning:             -- Social work and case management to assist with discharge planning and identification of hospital follow-up needs prior to discharge            -- Estimated LOS: 3-5 days            -- Discharge Concerns: Need to establish a safety plan; Medication compliance and effectiveness            -- Discharge Goals: Return home with outpatient referrals for mental health follow-up including medication management/psychotherapy     Marsa GORMAN Rosser, DO 09/13/2024, 9:13 AM

## 2024-09-13 NOTE — Group Note (Unsigned)
 [facility] LCSW Group Therapy Note   Group Date: 09/13/2024 Start Time: 1000 End Time: 1100   Type of Therapy/Topic:  Group Therapy:  Emotion Regulation  Participation Level:  {[facility] PARTICIPATION LEVEL:22264}   Mood:  Description of Group:    The purpose of this group is to assist patients in learning to regulate negative emotions and experience positive emotions. Patients will be guided to discuss ways in which they have been vulnerable to their negative emotions. These vulnerabilities will be juxtaposed with experiences of positive emotions or situations, and patients challenged to use positive emotions to combat negative ones. Special emphasis will be placed on coping with negative emotions in conflict situations, and patients will process healthy conflict resolution skills.  Therapeutic Goals: 1. Patient will identify two positive emotions or experiences to reflect on in order to balance out negative emotions:  2. Patient will label two or more emotions that they find the most difficult to experience:  3. Patient will be able to demonstrate positive conflict resolution skills through discussion or role plays:   Summary of Patient Progress:   ***    Therapeutic Modalities:   Cognitive Behavioral Therapy Feelings Identification Dialectical Behavioral Therapy   Cory Louder, LCSW

## 2024-09-13 NOTE — Group Note (Signed)
 Date:  09/13/2024 Time:  6:05 PM  Group Topic/Focus:  Patients created intuitive collages, supporting mindfulness and focus, using the creative process to organize and visually express complex feelings that may be hard to verbalize. The format encouraged interpersonal connection, personal sharing, and peer support, which can foster social engagement and enhance emotional safety.    Participation Level:  None  Participation Quality:  patient walked into group at the very end, so he was unable to participate. Pt reported that he had been sleeping and missed group   Cory Floyd 09/13/2024, 6:05 PM

## 2024-09-13 NOTE — [facility] Group Notes (Signed)
 Cory Floyd did not attend the social work group today 09/13/24 (1000-1100).

## 2024-09-14 MED ORDER — ARIPIPRAZOLE 5 MG PO TABS
5.0000 mg | ORAL_TABLET | Freq: Once | ORAL | Status: AC
  Administered 2024-09-14: 5 mg via ORAL
  Filled 2024-09-14: qty 1

## 2024-09-14 MED ORDER — KETOROLAC TROMETHAMINE 60 MG/2ML IM SOLN
30.0000 mg | Freq: Once | INTRAMUSCULAR | Status: AC
  Administered 2024-09-14: 30 mg via INTRAMUSCULAR
  Filled 2024-09-14: qty 2

## 2024-09-14 MED ORDER — KETOROLAC TROMETHAMINE 60 MG/2ML IM SOLN: 60.0000 mg | Freq: Once | INTRAMUSCULAR | Status: DC

## 2024-09-14 MED ORDER — ARIPIPRAZOLE 15 MG PO TABS
15.0000 mg | ORAL_TABLET | Freq: Every day | ORAL | Status: DC
  Administered 2024-09-15 – 2024-09-19 (×4): 15 mg via ORAL
  Filled 2024-09-14 (×4): qty 1
  Filled 2024-09-14: qty 7

## 2024-09-14 NOTE — Group Note (Signed)
 Date:  09/14/2024 Time:  10:24 AM  Group Topic/Focus:  Emotional Wellness: Focused on developing emotional awareness and intelligence by exploring the Feelings Wheel and reflecting on insights from a TEDx talk about emotional well-being. Our aim was to deepen our understanding of emotions, improve our ability to identify and express feelings, and explore practical strategies for managing emotions in a healthy way.  Participation Level:  Did Not Attend  Cory Floyd 09/14/2024, 10:24 AM

## 2024-09-14 NOTE — Progress Notes (Signed)
(  Sleep Hours) -9.5 (Any PRNs that were needed, meds refused, or side effects to meds)- trazodone  (Any disturbances and when (visitation, over night)-none (Concerns raised by the patient)- none (SI/HI/AVH)-denies all

## 2024-09-14 NOTE — Progress Notes (Signed)
(  Sleep Hours) -9.25 (Any PRNs that were needed, meds refused, or side effects to meds)-  (Any disturbances and when (visitation, over night)- (Concerns raised by the patient)-  (SI/HI/AVH)-

## 2024-09-14 NOTE — Progress Notes (Signed)
   09/14/24 1100  Psych Admission Type (Psych Patients Only)  Admission Status Involuntary  Psychosocial Assessment  Patient Complaints Depression  Eye Contact Brief  Facial Expression Flat  Affect Flat  Speech Logical/coherent  Interaction Isolative  Motor Activity Slow  Appearance/Hygiene Unremarkable  Behavior Characteristics Cooperative;Appropriate to situation  Mood Depressed;Pleasant  Thought Process  Coherency WDL  Content Blaming others  Delusions None reported or observed  Perception Hallucinations  Hallucination Auditory;Visual (reported by patient, not observed by this RN)  Judgment Poor  Confusion None  Danger to Self  Current suicidal ideation? Denies  Agreement Not to Harm Self Yes  Description of Agreement verbal  Danger to Others  Danger to Others None reported or observed

## 2024-09-14 NOTE — Group Note (Signed)
 Date:  09/14/2024 Time:  8:47 PM  Group Topic/Focus:  Addiction - Ted Talk on Addiction/[place]    Participation Level:  Did Not Attend Cory Floyd 09/14/2024, 8:47 PM

## 2024-09-14 NOTE — Plan of Care (Signed)
   Problem: Education: Goal: Mental status will improve Outcome: Progressing   Problem: Activity: Goal: Interest or engagement in activities will improve Outcome: Progressing

## 2024-09-14 NOTE — Progress Notes (Signed)
 [facility] MD Progress Note  09/14/2024 12:16 PM Cory Floyd  MRN:  [id number] Subjective:   Cory Floyd is a 48 y.o. male  with a past psychiatric history of bipolar 1 disorder, substance-induced psychosis, stimulant use disorder (methamphetamine, cocaine, alcohol). He has multiple past psychiatric hospitalizations and an ACT team. Patient initially arrived to [facility] on 09/04/24 for hallucinations and paranoid ideation and admitted to [facility] under IVC on 11/14 for acute safety concerns and crisis stabalization.  Patient has a history of bipolar disorder as well as substance-induced psychosis.      Case was discussed in the multidisciplinary team. MAR was reviewed and patient was not compliant with medications.  Yesterday he received PRN Trazodone .      Psychiatric Team made the following recommendations yesterday: -Continue Abilify  10 mg daily for mood stability and psychosis -Continue Zoloft  25 mg daily for depression -Continue Depakote  1000 mg QHS for mood stability      On interview today patient reports he slept poor last night due to his back pain.  He reports his appetite is doing good.  He reports he continues to have Passive SI due to his pain.  He reports no HI.  He reports continued AH and VH but does report there is some improvement.  He reports no issues with his medications.  He reports he continues to have racing thoughts.  Discussed further increasing his Abilify  to address and he was agreeable.  Discussed giving him a Toradol  injection for his back pain and he was agreeable to this.  He reports no other concerns at present.   Principal Problem: Psychoactive substance-induced psychosis (HCC) Diagnosis: Principal Problem:   Psychoactive substance-induced psychosis (HCC) Active Problems:   Bipolar affective disorder, current episode manic (HCC)   Severe stimulant use disorder (HCC)  Total Time spent with patient:  I personally spent 35 minutes on the unit in direct patient  care. The direct patient care time included face-to-face time with the patient, reviewing the patient's chart, communicating with other professionals, and coordinating care.    Past Psychiatric History:  bipolar 1 disorder, substance-induced psychosis, stimulant use disorder (methamphetamine, cocaine, alcohol). He has multiple past psychiatric hospitalizations and an ACT team.   Past Medical History:  Past Medical History:  Diagnosis Date   Alcoholic (HCC)    clean for 3 years   Anginal pain    Arthritis    Bipolar 1 disorder (HCC)    COVID-19    Depressed    Diabetes mellitus without complication (HCC)    Dyspnea    Fatty liver    Hypertension    Schizophrenia (HCC)     Past Surgical History:  Procedure Laterality Date   COLONOSCOPY  02/05/2014   diverticulosis, hyperplastic polyp x 2   fracture arm Right    INCISION AND DRAINAGE ABSCESS Bilateral 12/29/2022   Procedure: INCISION AND DRAINAGE ABSCESS BILATERAL ARMS;  Surgeon: Kendal Franky SQUIBB, MD;  Location: [facility] OR;  Service: Orthopedics;  Laterality: Bilateral;   KNEE SURGERY Right 2013   NASAL SEPTUM SURGERY     NASAL TURBINATE REDUCTION Bilateral 12/12/2016   Procedure: TURBINATE REDUCTION/SUBMUCOSAL RESECTION;  Surgeon: Chinita Hasten, MD;  Location: [facility] ORS;  Service: ENT;  Laterality: Bilateral;   Family History:  Family History  Problem Relation Age of Onset   Hepatitis C Mother    Cancer Mother    Cancer Maternal Aunt    Hypertension Maternal Grandmother    Family Psychiatric  History:  uncle and aunt who  completed suicide   Social History:  Social History   Substance and Sexual Activity  Alcohol Use Yes   Comment: 1/2-5th pint of liquor     Social History   Substance and Sexual Activity  Drug Use Yes   Types: Cocaine, Marijuana   Comment: heroin    Social History   Socioeconomic History   Marital status: Divorced    Spouse name: Not on file   Number of children: Not on file   Years of education:  Not on file   Highest education level: Not on file  Occupational History   Not on file  Tobacco Use   Smoking status: Every Day    Current packs/day: 1.00    Average packs/day: 1 pack/day for 20.0 years (20.0 ttl pk-yrs)    Types: Cigarettes   Smokeless tobacco: Never  Vaping Use   Vaping status: Never Used  Substance and Sexual Activity   Alcohol use: Yes    Comment: 1/2-5th pint of liquor   Drug use: Yes    Types: Cocaine, Marijuana    Comment: heroin   Sexual activity: Yes    Birth control/protection: None  Other Topics Concern   Not on file  Social History Narrative   Not on file   Social Drivers of Health   Financial Resource Strain: Not on file  Food Insecurity: Food Insecurity Present (09/05/2024)   Hunger Vital Sign    Worried About Running Out of Food in the Last Year: Often true    Ran Out of Food in the Last Year: Often true  Transportation Needs: Unmet Transportation Needs (09/05/2024)   PRAPARE - Administrator, Civil Service (Medical): Yes    Lack of Transportation (Non-Medical): Yes  Physical Activity: Not on file  Stress: Not on file  Social Connections: Not on file   Additional Social History:                         Sleep: Poor Estimated Sleeping Duration (Last 24 Hours): 7.75-8.50 hours (Due to [employer] Time, the durations displayed may not accurately represent documentation during the time change interval)  Appetite:  Good  Current Medications: Current Facility-Administered Medications  Medication Dose Route Frequency Provider Last Rate Last Admin   acetaminophen  (TYLENOL ) tablet 650 mg  650 mg Oral Q6H PRN Mannie Jerel PARAS, NP   650 mg at 09/11/24 2202   alum & mag hydroxide-simeth (MAALOX/MYLANTA) 200-200-20 MG/5ML suspension 30 mL  30 mL Oral Q4H PRN Mannie Jerel PARAS, NP       [START ON 09/15/2024] ARIPiprazole  (ABILIFY ) tablet 15 mg  15 mg Oral Daily Annelisa Ryback S, DO       haloperidol  (HALDOL ) tablet 5  mg  5 mg Oral TID PRN Mannie Jerel PARAS, NP       And   diphenhydrAMINE  (BENADRYL ) capsule 50 mg  50 mg Oral TID PRN Mannie Jerel PARAS, NP       haloperidol  lactate (HALDOL ) injection 10 mg  10 mg Intramuscular TID PRN Mannie Jerel PARAS, NP       And   diphenhydrAMINE  (BENADRYL ) injection 50 mg  50 mg Intramuscular TID PRN Mannie Jerel PARAS, NP       And   LORazepam  (ATIVAN ) injection 2 mg  2 mg Intramuscular TID PRN Mannie Jerel PARAS, NP       haloperidol  lactate (HALDOL ) injection 5 mg  5 mg Intramuscular TID PRN Mannie Jerel PARAS, NP  And   diphenhydrAMINE  (BENADRYL ) injection 50 mg  50 mg Intramuscular TID PRN Mannie Jerel PARAS, NP       And   LORazepam  (ATIVAN ) injection 2 mg  2 mg Intramuscular TID PRN Mannie Jerel PARAS, NP       divalproex  (DEPAKOTE  ER) 24 hr tablet 1,000 mg  1,000 mg Oral QHS Butler, Laura N, MD   1,000 mg at 09/13/24 2125   feeding supplement (ENSURE PLUS HIGH PROTEIN) liquid 237 mL  237 mL Oral BID BM Raliegh Cory RAMAN, DO   237 mL at 09/14/24 1015   ketorolac  (TORADOL ) injection 30 mg  30 mg Intramuscular Once Miosha Behe S, DO       lidocaine  (LIDODERM ) 5 % 1 patch  1 patch Transdermal Daily Towana Leita SAILOR, MD   1 patch at 09/13/24 1046   magnesium  hydroxide (MILK OF MAGNESIA) suspension 30 mL  30 mL Oral Daily PRN Mannie Jerel PARAS, NP       nicotine  (NICODERM CQ  - dosed in mg/24 hours) patch 21 mg  21 mg Transdermal Daily Mannie Jerel PARAS, NP       sertraline  (ZOLOFT ) tablet 25 mg  25 mg Oral Daily Barb Shear S, DO   25 mg at 09/14/24 9162   sodium chloride  (OCEAN) 0.65 % nasal spray 1 spray  1 spray Each Nare BID PRN Bobbitt, Shalon E, NP   1 spray at 09/07/24 1706   traZODone  (DESYREL ) tablet 100-200 mg  100-200 mg Oral QHS PRN Mannie Jerel PARAS, NP   200 mg at 09/13/24 2125    Lab Results: No results found for this or any previous visit (from the past 48 hours).  Blood Alcohol level:  Lab Results  Component Value Date   ETH <15  09/04/2024   ETH 26 (H) 08/21/2024    Metabolic Disorder Labs: Lab Results  Component Value Date   HGBA1C 5.8 (H) 12/31/2022   MPG 120 12/31/2022   MPG 111 04/02/2021   No results found for: PROLACTIN Lab Results  Component Value Date   CHOL 149 12/31/2022   TRIG 160 (H) 12/31/2022   HDL 26 (L) 12/31/2022   CHOLHDL 5.7 12/31/2022   VLDL 32 12/31/2022   LDLCALC 91 12/31/2022   LDLCALC 122 (H) 05/04/2021    Physical Findings: AIMS:  ,  ,  ,  ,  ,  ,   CIWA:    COWS:     Musculoskeletal: Strength & Muscle Tone: within normal limits Gait & Station: normal Patient leans: N/A  Psychiatric Specialty Exam:  Presentation  General Appearance:  Casual  Eye Contact: Fair  Speech: Clear and Coherent; Normal Rate  Speech Volume: Normal  Handedness:No data recorded  Mood and Affect  Mood: Dysphoric  Affect: Congruent   Thought Process  Thought Processes: Coherent; Linear  Descriptions of Associations:Intact  Orientation:Full (Time, Place and Person)  Thought Content:Delusions  History of Schizophrenia/Schizoaffective disorder:No  Duration of Psychotic Symptoms:Less than six months  Hallucinations:Hallucinations: Auditory; Visual (AVH improving slightly)  Ideas of Reference:Paranoia  Suicidal Thoughts:Suicidal Thoughts: Yes, Passive  Homicidal Thoughts:Homicidal Thoughts: No   Sensorium  Memory: Immediate Fair  Judgment: Intact  Insight: Present   Executive Functions  Concentration: Fair  Attention Span: Fair  Recall: [employer] of Knowledge: Fair  Language: Fair   Psychomotor Activity  Psychomotor Activity: Psychomotor Activity: Normal   Assets  Assets: Resilience   Sleep  Sleep: Sleep: Poor (due to back pain)    Physical Exam: Physical  Exam Vitals and nursing note reviewed.  Constitutional:      General: He is not in acute distress.    Appearance: Normal appearance. He is normal weight. He is not  ill-appearing or toxic-appearing.  HENT:     Head: Normocephalic and atraumatic.  Pulmonary:     Effort: Pulmonary effort is normal.  Neurological:     Mental Status: He is alert.    Review of Systems  Respiratory:  Negative for cough and shortness of breath.   Cardiovascular:  Negative for chest pain.  Gastrointestinal:  Negative for abdominal pain, constipation, diarrhea, nausea and vomiting.  Neurological:  Negative for dizziness, weakness and headaches.  Psychiatric/Behavioral:  Positive for depression, hallucinations and suicidal ideas. The patient is not nervous/anxious.    Blood pressure 115/67, pulse 92, temperature 97.7 F (36.5 C), temperature source Oral, resp. rate 16, height 5' 10 (1.778 m), weight 90.7 kg, SpO2 99%. Body mass index is 28.7 kg/m.   Treatment Plan Summary: Daily contact with patient to assess and evaluate symptoms and progress in treatment and Medication management  Cory Floyd is a 49 y.o. male  with a past psychiatric history of bipolar 1 disorder, substance-induced psychosis, stimulant use disorder (methamphetamine, cocaine, alcohol). He has multiple past psychiatric hospitalizations and an ACT team. Patient initially arrived to [facility] on 09/04/24 for hallucinations and paranoid ideation and admitted to [facility] under IVC on 11/14 for acute safety concerns and crisis stabalization.  Patient has a history of bipolar disorder as well as substance-induced psychosis. On this admission he has presented with paranoia, was intermittently responding to internal stimuli, and disorganized in his thought process. Upon arrival, he remained paranoid and uncooperative on exam, demonstrating overt psychotic symptoms and IVC was continued. Home medications (abilify , mirtazapine  and tegretol ) were continued.      Cory Floyd is continuing to clear with the taking of his medication but does continue to have racing thoughts and AVH.  We will further increase his Abilify  to address.  He  continues to have significant back pain so we will trial a one time dose of Toradol .  We will continue to monitor.    Diagnoses / Active Problems: Substance-induced psychosis  Bipolar 1 Disorder Stimulant use disorder    PLAN: Safety and Monitoring:            --  INVOLUNTARY  admission to inpatient psychiatric unit for safety, stabilization and treatment            -- Daily contact with patient to assess and evaluate symptoms and progress in treatment            -- Patient's case to be discussed in multi-disciplinary team meeting            -- Observation Level : q15 minute checks            -- Vital signs:  q12 hours            -- Precautions: suicide, elopement, and assault    2. Psychiatric Diagnoses and Treatment:  -Increase Abilify  to 15 mg daily for mood stability and psychosis -Continue Zoloft  25 mg daily for depression -Continue Depakote  1000 mg QHS for mood stability                                    -- Metabolic profile and EKG monitoring obtained while on an atypical antipsychotic  BMI: 28.7 TSH: WNL Most  recent QTC (11/14) of 434     3. Medical Issues Being Addressed:  --none, patient has history of rhabdomyolysis and cellulitis -One time dose Toradol  30 mg IM once   4. Discharge Planning:             -- Social work and case management to assist with discharge planning and identification of hospital follow-up needs prior to discharge            -- Estimated LOS: 3-5 days            -- Discharge Concerns: Need to establish a safety plan; Medication compliance and effectiveness            -- Discharge Goals: Return home with outpatient referrals for mental health follow-up including medication management/psychotherapy     Cory GORMAN Rosser, DO 09/14/2024, 12:16 PM

## 2024-09-14 NOTE — Group Note (Signed)
 Date:  09/14/2024 Time:  10:14 AM  Group Topic/Focus:  Goals Group:  Participants assessed their current level of intellectual wellness, identify areas for growth, and create personalized, achievable goals that foster lifelong learning, critical thinking, creativity, and engagement with thought-provoking activities. By the end of the session, each participant will have a clear, actionable SMART goal aimed at enhancing their intellectual well-being, with a plan for ongoing reflection and accountability to ensure progress.  Participation Level:  Did Not Attend  Cory Floyd [name] 09/14/2024, 10:14 AM

## 2024-09-14 NOTE — Group Note (Signed)
 Date:  09/14/2024 Time:  11:56 AM  Group Topic/Focus:  Physical Wellness: Develop a deeper sense of awareness and connection with their bodies through guided meditation and grounding techniques. By engaging in these practices, the goal is to reduce stress, promote relaxation, and enhance overall well-being. Participants will learn tools they can incorporate into their daily lives to manage physical tension, emotional stress, and increase mindfulness. The session aims to foster a supportive environment where individuals can share experiences, gain confidence in using mindfulness techniques, and improve their ability to remain grounded in the present moment.  Participation Level:  Did Not Attend  Cory Floyd 09/14/2024, 11:56 AM

## 2024-09-14 NOTE — BHH Group Notes (Signed)
 Cory Floyd did not attend wrap up group

## 2024-09-14 NOTE — Plan of Care (Signed)

## 2024-09-14 NOTE — Progress Notes (Signed)
   09/13/24 2200  Psych Admission Type (Psych Patients Only)  Admission Status Involuntary  Psychosocial Assessment  Patient Complaints Depression  Eye Contact Brief  Facial Expression Flat  Affect Flat  Speech Logical/coherent  Interaction Isolative  Motor Activity Slow  Appearance/Hygiene Unremarkable  Behavior Characteristics Cooperative;Appropriate to situation  Mood Depressed;Pleasant  Thought Process  Coherency WDL  Content Blaming others  Delusions None reported or observed  Perception WDL  Hallucination None reported or observed  Judgment Poor  Confusion None  Danger to Self  Current suicidal ideation? Denies  Danger to Others  Danger to Others None reported or observed

## 2024-09-15 LAB — LIPID PANEL
Cholesterol: 177 mg/dL (ref 0–200)
HDL: 51 mg/dL (ref >40)
LDL Cholesterol: 110 mg/dL — ABNORMAL HIGH (ref 0–99)
Total CHOL/HDL Ratio: 3.5 ratio
Triglycerides: 83 mg/dL (ref <150)
VLDL: 17 mg/dL (ref 0–40)

## 2024-09-15 LAB — HEMOGLOBIN A1C
Hgb A1c MFr Bld: 5.5 % (ref 4.8–5.6)
Mean Plasma Glucose: 111 mg/dL

## 2024-09-15 LAB — VALPROIC ACID LEVEL: Valproic Acid Lvl: 23 ug/mL — ABNORMAL LOW (ref 50–100)

## 2024-09-15 MED ORDER — GABAPENTIN 100 MG PO CAPS
100.0000 mg | ORAL_CAPSULE | Freq: Three times a day (TID) | ORAL | Status: DC
  Administered 2024-09-15 – 2024-09-19 (×10): 100 mg via ORAL
  Filled 2024-09-15 (×3): qty 1
  Filled 2024-09-15: qty 21
  Filled 2024-09-15 (×7): qty 1

## 2024-09-15 MED ORDER — KETOROLAC TROMETHAMINE 60 MG/2ML IM SOLN
30.0000 mg | Freq: Once | INTRAMUSCULAR | Status: AC
  Administered 2024-09-15: 30 mg via INTRAMUSCULAR
  Filled 2024-09-15: qty 2

## 2024-09-15 NOTE — Plan of Care (Signed)
   Problem: Education: Goal: Mental status will improve Outcome: Not Progressing

## 2024-09-15 NOTE — Group Note (Signed)
 Date:  09/15/2024 Time:  6:52 PM  Group Topic/Focus:  Chaplain Group (Grief and Loss)    Participation Level:  Did Not Attend  Cory Floyd 09/15/2024, 6:52 PM

## 2024-09-15 NOTE — Progress Notes (Signed)
 Cory Floyd   Type of Note: IVC COURT HEARING  Spoke with patient this morning at bedside. Informed pt of virtual IVC court hearing tomorrow afternoon. Pt states he does not want to participate I am not worried about it, let the doctor do what he wants to do, I will stay.   MD is requesting 7 more days.  Signed:  Irving Lubbers, LCSW-A 09/15/2024  9:43 AM

## 2024-09-15 NOTE — Group Note (Signed)
 Date:  09/15/2024 Time:  10:11 AM  Group Topic/Focus:  Goals Group:   The focus of this group is to help patients establish daily goals to achieve during treatment and discuss how the patient can incorporate goal setting into their daily lives to aide in recovery.    Participation Level:  Did Not Attend  Participation Quality:  Did Not Attend  Affect:  Did Not Attend  Cognitive:  Did Not Attend  Insight: None  Engagement in Group:  Did Not Attend  Modes of Intervention:  Did Not Attend  Additional Comments:  Did Not Attend  Cory Floyd 09/15/2024, 10:11 AM

## 2024-09-15 NOTE — [facility] Group Notes (Signed)
 [facility] Group Notes:  (Nursing/MHT/Case Management/Adjunct)  Date:  09/15/2024  Time:  8:28 PM  Type of Therapy:  Psychoeducational Skills  Participation Level:  Did Not Attend  Participation Quality:  Did not attend  Affect:  Did not attend   Cognitive:  Did not attend  Insight:  None  Engagement in Group:  Did not attend   Modes of Intervention:  Education  Summary of Progress/Problems: Patient did not attend group this evening.   Milayna Rotenberg S 09/15/2024, 8:28 PM

## 2024-09-15 NOTE — Progress Notes (Signed)
 [facility] MD Progress Note  09/15/2024 12:41 PM Cory Floyd  MRN:  [id number] Subjective:   Cory Floyd is a 48 y.o. male  with a past psychiatric history of bipolar 1 disorder, substance-induced psychosis, stimulant use disorder (methamphetamine, cocaine, alcohol). He has multiple past psychiatric hospitalizations and an ACT team. Patient initially arrived to [facility] on 09/04/24 for hallucinations and paranoid ideation and admitted to [facility] under IVC on 11/14 for acute safety concerns and crisis stabalization.  Patient has a history of bipolar disorder as well as substance-induced psychosis.      Case was discussed in the multidisciplinary team. MAR was reviewed and patient was not compliant with medications.  Yesterday he received PRN Trazodone .      Psychiatric Team made the following recommendations yesterday: -Increase Abilify  to 15 mg daily for mood stability and psychosis  -Continue Zoloft  25 mg daily for depression -Continue Depakote  1000 mg QHS for mood stability -One time dose Toradol  30 mg IM once      On interview today patient reports he slept good last night.  He reports his appetite is doing good.  He reports no SI, HI, or AVH.  He reports no Paranoia or Ideas of Reference.  He reports no issues with his medications.  He reports that he does not remember sleeping that good in a very long time.  He reports that soon after the Toradol  shot he was able to go to sleep. Discussed an additional dose today and he was agreeable.  He reports no other concerns at present.   Principal Problem: Psychoactive substance-induced psychosis (HCC) Diagnosis: Principal Problem:   Psychoactive substance-induced psychosis (HCC) Active Problems:   Bipolar affective disorder, current episode manic (HCC)   Severe stimulant use disorder (HCC)  Total Time spent with patient:  I personally spent 35 minutes on the unit in direct patient care. The direct patient care time included face-to-face time with  the patient, reviewing the patient's chart, communicating with other professionals, and coordinating care.    Past Psychiatric History:  Bipolar 1 disorder, substance-induced psychosis, stimulant use disorder (methamphetamine, cocaine, alcohol). He has multiple past psychiatric hospitalizations and an ACT team.   Past Medical History:  Past Medical History:  Diagnosis Date   Alcoholic (HCC)    clean for 3 years   Anginal pain    Arthritis    Bipolar 1 disorder (HCC)    COVID-19    Depressed    Diabetes mellitus without complication (HCC)    Dyspnea    Fatty liver    Hypertension    Schizophrenia (HCC)     Past Surgical History:  Procedure Laterality Date   COLONOSCOPY  02/05/2014   diverticulosis, hyperplastic polyp x 2   fracture arm Right    INCISION AND DRAINAGE ABSCESS Bilateral 12/29/2022   Procedure: INCISION AND DRAINAGE ABSCESS BILATERAL ARMS;  Surgeon: Kendal Franky SQUIBB, MD;  Location: [facility] OR;  Service: Orthopedics;  Laterality: Bilateral;   KNEE SURGERY Right 2013   NASAL SEPTUM SURGERY     NASAL TURBINATE REDUCTION Bilateral 12/12/2016   Procedure: TURBINATE REDUCTION/SUBMUCOSAL RESECTION;  Surgeon: Chinita Hasten, MD;  Location: [facility] ORS;  Service: ENT;  Laterality: Bilateral;   Family History:  Family History  Problem Relation Age of Onset   Hepatitis C Mother    Cancer Mother    Cancer Maternal Aunt    Hypertension Maternal Grandmother    Family Psychiatric  History:  uncle and aunt who completed suicide   Social History:  Social History   Substance and Sexual Activity  Alcohol Use Yes   Comment: 1/2-5th pint of liquor     Social History   Substance and Sexual Activity  Drug Use Yes   Types: Cocaine, Marijuana   Comment: heroin    Social History   Socioeconomic History   Marital status: Divorced    Spouse name: Not on file   Number of children: Not on file   Years of education: Not on file   Highest education level: Not on file  Occupational  History   Not on file  Tobacco Use   Smoking status: Every Day    Current packs/day: 1.00    Average packs/day: 1 pack/day for 20.0 years (20.0 ttl pk-yrs)    Types: Cigarettes   Smokeless tobacco: Never  Vaping Use   Vaping status: Never Used  Substance and Sexual Activity   Alcohol use: Yes    Comment: 1/2-5th pint of liquor   Drug use: Yes    Types: Cocaine, Marijuana    Comment: heroin   Sexual activity: Yes    Birth control/protection: None  Other Topics Concern   Not on file  Social History Narrative   Not on file   Social Drivers of Health   Financial Resource Strain: Not on file  Food Insecurity: Food Insecurity Present (09/05/2024)   Hunger Vital Sign    Worried About Running Out of Food in the Last Year: Often true    Ran Out of Food in the Last Year: Often true  Transportation Needs: Unmet Transportation Needs (09/05/2024)   PRAPARE - Administrator, Civil Service (Medical): Yes    Lack of Transportation (Non-Medical): Yes  Physical Activity: Not on file  Stress: Not on file  Social Connections: Not on file   Additional Social History:                         Sleep: Good Estimated Sleeping Duration (Last 24 Hours): 10.25-12.25 hours (Due to [employer] Time, the durations displayed may not accurately represent documentation during the time change interval)  Appetite:  Fair  Current Medications: Current Facility-Administered Medications  Medication Dose Route Frequency Provider Last Rate Last Admin   acetaminophen  (TYLENOL ) tablet 650 mg  650 mg Oral Q6H PRN Mannie Jerel PARAS, NP   650 mg at 09/11/24 2202   alum & mag hydroxide-simeth (MAALOX/MYLANTA) 200-200-20 MG/5ML suspension 30 mL  30 mL Oral Q4H PRN Mannie Jerel PARAS, NP       ARIPiprazole  (ABILIFY ) tablet 15 mg  15 mg Oral Daily Kanijah Groseclose S, DO   15 mg at 09/15/24 9198   haloperidol  (HALDOL ) tablet 5 mg  5 mg Oral TID PRN Mannie Jerel PARAS, NP       And    diphenhydrAMINE  (BENADRYL ) capsule 50 mg  50 mg Oral TID PRN Mannie Jerel PARAS, NP       haloperidol  lactate (HALDOL ) injection 10 mg  10 mg Intramuscular TID PRN Mannie Jerel PARAS, NP       And   diphenhydrAMINE  (BENADRYL ) injection 50 mg  50 mg Intramuscular TID PRN Mannie Jerel PARAS, NP       And   LORazepam  (ATIVAN ) injection 2 mg  2 mg Intramuscular TID PRN Mannie Jerel PARAS, NP       haloperidol  lactate (HALDOL ) injection 5 mg  5 mg Intramuscular TID PRN Mannie Jerel PARAS, NP       And  diphenhydrAMINE  (BENADRYL ) injection 50 mg  50 mg Intramuscular TID PRN Mannie Jerel PARAS, NP       And   LORazepam  (ATIVAN ) injection 2 mg  2 mg Intramuscular TID PRN Mannie Jerel PARAS, NP       divalproex  (DEPAKOTE  ER) 24 hr tablet 1,000 mg  1,000 mg Oral QHS Butler, Laura N, MD   1,000 mg at 09/13/24 2125   feeding supplement (ENSURE PLUS HIGH PROTEIN) liquid 237 mL  237 mL Oral BID BM Jamaris Biernat S, DO   237 mL at 09/15/24 0801   gabapentin  (NEURONTIN ) capsule 100 mg  100 mg Oral TID Preeti Winegardner S, DO   100 mg at 09/15/24 1201   ketorolac  (TORADOL ) injection 30 mg  30 mg Intramuscular Once Mikhai Bienvenue S, DO       lidocaine  (LIDODERM ) 5 % 1 patch  1 patch Transdermal Daily Towana Leita SAILOR, MD   1 patch at 09/13/24 1046   magnesium  hydroxide (MILK OF MAGNESIA) suspension 30 mL  30 mL Oral Daily PRN Mannie Jerel PARAS, NP       nicotine  (NICODERM CQ  - dosed in mg/24 hours) patch 21 mg  21 mg Transdermal Daily Mannie Jerel PARAS, NP       sertraline  (ZOLOFT ) tablet 25 mg  25 mg Oral Daily Sky Primo S, DO   25 mg at 09/15/24 0801   sodium chloride  (OCEAN) 0.65 % nasal spray 1 spray  1 spray Each Nare BID PRN Bobbitt, Shalon E, NP   1 spray at 09/07/24 1706   traZODone  (DESYREL ) tablet 100-200 mg  100-200 mg Oral QHS PRN Mannie Jerel PARAS, NP   200 mg at 09/13/24 2125    Lab Results:  Results for orders placed or performed during the hospital encounter of 09/05/24 (from the past 48  hours)  Lipid panel     Status: Abnormal   Collection Time: 09/15/24  6:29 AM  Result Value Ref Range   Cholesterol 177 0 - 200 mg/dL    Comment:        ATP III CLASSIFICATION:  <200     mg/dL   Desirable  799-760  mg/dL   Borderline High  >=759    mg/dL   High           Triglycerides 83 <150 mg/dL   HDL 51 >59 mg/dL   Total CHOL/HDL Ratio 3.5 RATIO   VLDL 17 0 - 40 mg/dL   LDL Cholesterol 889 (H) 0 - 99 mg/dL    Comment:        Total Cholesterol/HDL:CHD Risk Coronary Heart Disease Risk Table                     Men   Women  1/2 Average Risk   3.4   3.3  Average Risk       5.0   4.4  2 X Average Risk   9.6   7.1  3 X Average Risk  23.4   11.0        Use the calculated Patient Ratio above and the CHD Risk Table to determine the patient's CHD Risk.        ATP III CLASSIFICATION (LDL):  <100     mg/dL   Optimal  899-870  mg/dL   Near or Above                    Optimal  130-159  mg/dL   Borderline  839-810  mg/dL   High  >809     mg/dL   Very High Performed at [facility], [address] [place], [place] [address]   Valproic acid  level     Status: Abnormal   Collection Time: 09/15/24  6:29 AM  Result Value Ref Range   Valproic Acid  Lvl 23 (L) 50 - 100 ug/mL    Comment: Performed at [facility], [address], [place], [place] [address]    Blood Alcohol level:  Lab Results  Component Value Date   ETH <15 09/04/2024   ETH 26 (H) 08/21/2024    Metabolic Disorder Labs: Lab Results  Component Value Date   HGBA1C 5.8 (H) 12/31/2022   MPG 120 12/31/2022   MPG 111 04/02/2021   No results found for: PROLACTIN Lab Results  Component Value Date   CHOL 177 09/15/2024   TRIG 83 09/15/2024   HDL 51 09/15/2024   CHOLHDL 3.5 09/15/2024   VLDL 17 09/15/2024   LDLCALC 110 (H) 09/15/2024   LDLCALC 91 12/31/2022    Physical Findings: AIMS:  ,  ,  ,  ,  ,  ,   CIWA:    COWS:     Musculoskeletal: Strength & Muscle Tone:  within normal limits Gait & Station: normal Patient leans: N/A  Psychiatric Specialty Exam:  Presentation  General Appearance:  Casual  Eye Contact: Fair  Speech: Clear and Coherent; Normal Rate  Speech Volume: Normal  Handedness:No data recorded  Mood and Affect  Mood: -- (ok)  Affect: Congruent   Thought Process  Thought Processes: Coherent; Linear  Descriptions of Associations:Intact  Orientation:Full (Time, Place and Person)  Thought Content:WDL  History of Schizophrenia/Schizoaffective disorder:No  Duration of Psychotic Symptoms:Less than six months  Hallucinations:Hallucinations: -- (reports none at present)  Ideas of Reference:None  Suicidal Thoughts:Suicidal Thoughts: No  Homicidal Thoughts:Homicidal Thoughts: No   Sensorium  Memory: Immediate Fair  Judgment: Intact  Insight: Present   Executive Functions  Concentration: Fair  Attention Span: Fair  Recall: [employer] of Knowledge: Fair  Language: Fair   Psychomotor Activity  Psychomotor Activity: Psychomotor Activity: Normal   Assets  Assets: Resilience   Sleep  Sleep: Sleep: Good    Physical Exam: Physical Exam Review of Systems  Respiratory:  Negative for cough and shortness of breath.   Cardiovascular:  Negative for chest pain.  Gastrointestinal:  Negative for abdominal pain, constipation, diarrhea, nausea and vomiting.  Neurological:  Negative for dizziness, weakness and headaches.  Psychiatric/Behavioral:  Positive for depression. Negative for hallucinations and suicidal ideas. The patient is not nervous/anxious.    Blood pressure 126/85, pulse 78, temperature 98 F (36.7 C), temperature source Oral, resp. rate 16, height 5' 10 (1.778 m), weight 90.7 kg, SpO2 99%. Body mass index is 28.7 kg/m.   Treatment Plan Summary: Daily contact with patient to assess and evaluate symptoms and progress in treatment and Medication management  Cory Floyd is a 48 y.o. male  with a past psychiatric history of bipolar 1 disorder, substance-induced psychosis, stimulant use disorder (methamphetamine, cocaine, alcohol). He has multiple past psychiatric hospitalizations and an ACT team. Patient initially arrived to [facility] on 09/04/24 for hallucinations and paranoid ideation and admitted to [facility] under IVC on 11/14 for acute safety concerns and crisis stabalization.  Patient has a history of bipolar disorder as well as substance-induced psychosis. On this admission he has presented with paranoia, was intermittently responding to internal stimuli, and disorganized in his thought  process. Upon arrival, he remained paranoid and uncooperative on exam, demonstrating overt psychotic symptoms and IVC was continued. Home medications (abilify , mirtazapine  and tegretol ) were continued.      Cory Floyd was able to get good sleep last night with the Toradol  shot and had energy during the interview and was the one who started the interview this morning.  We will do another dose of Toradol  as if he is able to get another good nights sleep he should continue to improve.  We will continue to monitor.     Diagnoses / Active Problems: Substance-induced psychosis  Bipolar 1 Disorder Stimulant use disorder    PLAN: Safety and Monitoring:            --  INVOLUNTARY  admission to inpatient psychiatric unit for safety, stabilization and treatment            -- Daily contact with patient to assess and evaluate symptoms and progress in treatment            -- Patient's case to be discussed in multi-disciplinary team meeting            -- Observation Level : q15 minute checks            -- Vital signs:  q12 hours            -- Precautions: suicide, elopement, and assault    2. Psychiatric Diagnoses and Treatment:  -Continue Abilify  15 mg daily for mood stability and psychosis -Continue Zoloft  25 mg daily for depression -Continue Depakote  1000 mg QHS for mood stability                                     -- Metabolic profile and EKG monitoring obtained while on an atypical antipsychotic  BMI: 28.7 TSH: WNL Most recent QTC (11/14) of 434     3. Medical Issues Being Addressed:  --none, patient has history of rhabdomyolysis and cellulitis -Additional one time dose Toradol  30 mg IM once   4. Discharge Planning:             -- Social work and case management to assist with discharge planning and identification of hospital follow-up needs prior to discharge            -- Estimated LOS: 3-5 days            -- Discharge Concerns: Need to establish a safety plan; Medication compliance and effectiveness            -- Discharge Goals: Return home with outpatient referrals for mental health follow-up including medication management/psychotherapy     Cory GORMAN Rosser, DO 09/15/2024, 12:41 PM

## 2024-09-15 NOTE — Group Note (Signed)
 Date:  09/15/2024 Time:  1:18 PM  Group Topic/Focus:  Physical Wellness:   The focus of this group is to encourage patients  to improve and maintain their physical health through regular movement, exercise, and wellness practices. Today, the patients followed an exercise video.    Participation Level:  Did Not Attend   Cory Floyd 09/15/2024, 1:18 PM

## 2024-09-15 NOTE — Group Note (Signed)
 Recreation Therapy Group Note   Group Topic:Team Building  Group Date: 09/15/2024 Start Time: 0940 End Time: 1005 Facilitators: Trayquan Kolakowski-McCall, LRT,CTRS Location: [address] Dayroom   Group Topic: Communication, Team Building, Problem Solving  Goal Area(s) Addresses:  Patient will effectively work with peer towards shared goal.  Patient will identify skills used to make activity successful.  Patient will share challenges and verbalize solution-driven approaches used. Patient will identify how skills used during activity can be used to reach post d/c goals.   Behavioral Response:   Intervention: STEM Activity   Activity: [employer]. Patients were provided the following materials: 4 drinking straws, 5 rubber bands, 5 paper clips, 2 index cards and 2 drinking cups. Using the provided materials patients were asked to build a launching mechanism to launch a ping pong ball across the room, approximately 10 feet. Patients were divided into teams of 3-5. Instructions required all materials be incorporated into the device, functionality of items left to the peer group's discretion.  Education: Pharmacist, Community, Scientist, Physiological, Air Cabin Crew, Building Control Surveyor.   Education Outcome: Acknowledges education/In group clarification offered/Needs additional education.    Affect/Mood: N/A   Participation Level: Did not attend    Clinical Observations/Individualized Feedback:     Plan: Continue to engage patient in RT group sessions 2-3x/week.   Tannisha Kennington-McCall, LRT,CTRS 09/15/2024 12:34 PM

## 2024-09-15 NOTE — Progress Notes (Signed)
   09/15/24 2300  Psych Admission Type (Psych Patients Only)  Admission Status Involuntary  Psychosocial Assessment  Patient Complaints Depression  Eye Contact Brief  Facial Expression Flat  Affect Flat  Speech Logical/coherent  Interaction Minimal  Motor Activity Slow  Appearance/Hygiene Unremarkable  Behavior Characteristics Cooperative  Mood Pleasant  Thought Process  Coherency WDL  Content Blaming others  Delusions None reported or observed  Perception WDL  Hallucination None reported or observed  Judgment Poor  Confusion None  Danger to Self  Current suicidal ideation? Denies  Agreement Not to Harm Self Yes  Description of Agreement Verbal  Danger to Others  Danger to Others None reported or observed

## 2024-09-15 NOTE — Plan of Care (Signed)

## 2024-09-15 NOTE — Group Note (Signed)
 Date:  09/15/2024 Time:  6:58 PM  Group Topic/Focus:  Occupational Therapy    Participation Level:  Did Not Attend  MATIS MONNIER 09/15/2024, 6:58 PM

## 2024-09-15 NOTE — Progress Notes (Signed)
   09/15/24 0900  Psych Admission Type (Psych Patients Only)  Admission Status Involuntary  Psychosocial Assessment  Patient Complaints Depression  Eye Contact Brief  Facial Expression Flat  Affect Flat  Speech Logical/coherent  Interaction Minimal  Motor Activity Slow  Appearance/Hygiene Unremarkable  Behavior Characteristics Cooperative;Appropriate to situation  Mood Depressed;Pleasant  Thought Process  Coherency WDL  Content Blaming others  Delusions None reported or observed  Perception WDL  Hallucination None reported or observed  Judgment Poor  Confusion None  Danger to Self  Current suicidal ideation? Denies  Agreement Not to Harm Self Yes  Description of Agreement verbal  Danger to Others  Danger to Others None reported or observed

## 2024-09-16 DIAGNOSIS — F1520 Other stimulant dependence, uncomplicated: Secondary | ICD-10-CM | POA: Diagnosis not present

## 2024-09-16 DIAGNOSIS — F319 Bipolar disorder, unspecified: Secondary | ICD-10-CM | POA: Diagnosis not present

## 2024-09-16 DIAGNOSIS — F19959 Other psychoactive substance use, unspecified with psychoactive substance-induced psychotic disorder, unspecified: Secondary | ICD-10-CM | POA: Diagnosis not present

## 2024-09-16 NOTE — Progress Notes (Signed)
 Patient has been isolative to his room this shift. Patient did not answer assessment questions for nurse initially, but later reported high anxiety and depression. Patient denies SI, HI, and AVH at this time. Patient was more alert and less isolative in the evening. Patient was noted to be engaged with peers. Patient compliant with medications in the evening.   Assess patient for safety, offer medications as prescribed, engage patient in 1:1 staff talks.   Patient able to contract for safety. Continue to monitor as planned.

## 2024-09-16 NOTE — Progress Notes (Signed)
 [facility] MD Progress Note  09/16/2024 8:30 AM Cory Floyd  MRN:  [id number]  Principal Problem: Psychoactive substance-induced psychosis (HCC) Diagnosis: Principal Problem:   Psychoactive substance-induced psychosis (HCC) Active Problems:   Bipolar affective disorder, current episode manic (HCC)   Severe stimulant use disorder (HCC)  Total Time spent with patient: 35 minutes  Identifying Information and reason for admission The patient is a 48 y.o. male with a psychiatric history of underlying bipolar 1 disorder as well as a history of substance-induced psychosis and severe stimulant use disorder. He presents on this admission with manic and psychotic symptoms in the community (bizarre behavior at the bank) in the context of substance use - admitted under IVC.  Interval events: Per chart review the patient has been medication compliant with increased dose of abilify  as well as other psychotropics. Did have some benefit with back pain with IM toradol . Was isolative to room, not attending most groups and minimally interactive with staff. Slept 16 hrs. No SI, HI or AVH reported. BP 125/73   Pulse 79   Temp 98 F (36.7 C) (Oral)   Resp 16   Ht 5' 10 (1.778 m)   Wt 90.7 kg   SpO2 99%   BMI 28.70 kg/m    Interview today:  Today the patient states he is okay. Reports his pain is overall better but my mental comes and goes. Is still intermittently haivng hallucinations. Does not feel the hallucinations were completely gone even when on the injection, however is open to restarting this. Not currently having SI. Not sure if he is depressed. States he feels tired and has been tired most of admission. When asked if he has been up out of his room a bit more he does report that he has been and will try to get up and do some groups today. No additional questions or concerns. No SI, HI. Does report improvements in sleep, continues to have low energy.     Past Psychiatric Hx: Current  Psychiatrist: none, was previously seen by ACT team but dropped due to medication noncompliance related to loss of provider  Current Therapist: none reported in chart review  Previous Psychiatric Diagnoses: Bipolar I disorder, substance-induced psychosis, cocaine abuse with cocaine-induced mood disorder Current psychiatric medications: trileptal  300 BID, remeron  15, abilify  20mg  at bedtime  Psychiatric medication history/compliance: history of medication non-compliance. Past medications include depakote , this was discontinued in the setting of medication non-compliance. Psychiatric Hospitalization hx: [facility] 12/2022 (SI, depression), 04/2021 (SI), 03/2021 (SI with plan to shoot self with gun), 01/2021 (aggressive behaviors and SI/HI threats) Psychotherapy hx: unknown Neuromodulation history: none History of suicide: yes, via IV drug use History of homicide or aggression (obtained in HPI): yes, 01/2021 hospitalization   Substance Abuse Hx: (Frequency, quantity, last use, impact) UDS+cocaine, amphetamines.  Patient has a history of IVDU.  Per chart review, patient has past history of stimulant use disorder (meth, cocaine) and tobacco use   Past Medical History:  Past Medical History:  Diagnosis Date   Alcoholic (HCC)    clean for 3 years   Anginal pain    Arthritis    Bipolar 1 disorder (HCC)    COVID-19    Depressed    Diabetes mellitus without complication (HCC)    Dyspnea    Fatty liver    Hypertension    Schizophrenia (HCC)     Past Surgical History:  Procedure Laterality Date   COLONOSCOPY  02/05/2014   diverticulosis, hyperplastic polyp x 2  fracture arm Right    INCISION AND DRAINAGE ABSCESS Bilateral 12/29/2022   Procedure: INCISION AND DRAINAGE ABSCESS BILATERAL ARMS;  Surgeon: Kendal Franky SQUIBB, MD;  Location: [facility] OR;  Service: Orthopedics;  Laterality: Bilateral;   KNEE SURGERY Right 2013   NASAL SEPTUM SURGERY     NASAL TURBINATE REDUCTION Bilateral 12/12/2016   Procedure:  TURBINATE REDUCTION/SUBMUCOSAL RESECTION;  Surgeon: Chinita Hasten, MD;  Location: [facility] ORS;  Service: ENT;  Laterality: Bilateral;   Family History:  Family History  Problem Relation Age of Onset   Hepatitis C Mother    Cancer Mother    Cancer Maternal Aunt    Hypertension Maternal Grandmother     Social History:  Social History   Substance and Sexual Activity  Alcohol Use Yes   Comment: 1/2-5th pint of liquor     Social History   Substance and Sexual Activity  Drug Use Yes   Types: Cocaine, Marijuana   Comment: heroin    Social History   Socioeconomic History   Marital status: Divorced    Spouse name: Not on file   Number of children: Not on file   Years of education: Not on file   Highest education level: Not on file  Occupational History   Not on file  Tobacco Use   Smoking status: Every Day    Current packs/day: 1.00    Average packs/day: 1 pack/day for 20.0 years (20.0 ttl pk-yrs)    Types: Cigarettes   Smokeless tobacco: Never  Vaping Use   Vaping status: Never Used  Substance and Sexual Activity   Alcohol use: Yes    Comment: 1/2-5th pint of liquor   Drug use: Yes    Types: Cocaine, Marijuana    Comment: heroin   Sexual activity: Yes    Birth control/protection: None  Other Topics Concern   Not on file  Social History Narrative   Not on file   Social Drivers of Health   Financial Resource Strain: Not on file  Food Insecurity: Food Insecurity Present (09/05/2024)   Hunger Vital Sign    Worried About Running Out of Food in the Last Year: Often true    Ran Out of Food in the Last Year: Often true  Transportation Needs: Unmet Transportation Needs (09/05/2024)   PRAPARE - Administrator, Civil Service (Medical): Yes    Lack of Transportation (Non-Medical): Yes  Physical Activity: Not on file  Stress: Not on file  Social Connections: Not on file    Current Medications: Current Facility-Administered Medications  Medication Dose  Route Frequency Provider Last Rate Last Admin   acetaminophen  (TYLENOL ) tablet 650 mg  650 mg Oral Q6H PRN Mannie Jerel PARAS, NP   650 mg at 09/11/24 2202   alum & mag hydroxide-simeth (MAALOX/MYLANTA) 200-200-20 MG/5ML suspension 30 mL  30 mL Oral Q4H PRN Mannie Jerel PARAS, NP       ARIPiprazole  (ABILIFY ) tablet 15 mg  15 mg Oral Daily Pashayan, Alexander S, DO   15 mg at 09/15/24 9198   haloperidol  (HALDOL ) tablet 5 mg  5 mg Oral TID PRN Mannie Jerel PARAS, NP       And   diphenhydrAMINE  (BENADRYL ) capsule 50 mg  50 mg Oral TID PRN Mannie Jerel PARAS, NP       haloperidol  lactate (HALDOL ) injection 10 mg  10 mg Intramuscular TID PRN Mannie Jerel PARAS, NP       And   diphenhydrAMINE  (BENADRYL ) injection 50 mg  50 mg  Intramuscular TID PRN Mannie Jerel PARAS, NP       And   LORazepam  (ATIVAN ) injection 2 mg  2 mg Intramuscular TID PRN Mannie Jerel PARAS, NP       haloperidol  lactate (HALDOL ) injection 5 mg  5 mg Intramuscular TID PRN Mannie Jerel PARAS, NP       And   diphenhydrAMINE  (BENADRYL ) injection 50 mg  50 mg Intramuscular TID PRN Mannie Jerel PARAS, NP       And   LORazepam  (ATIVAN ) injection 2 mg  2 mg Intramuscular TID PRN Mannie Jerel PARAS, NP       divalproex  (DEPAKOTE  ER) 24 hr tablet 1,000 mg  1,000 mg Oral QHS Towana Cory SAILOR, MD   1,000 mg at 09/15/24 2144   feeding supplement (ENSURE PLUS HIGH PROTEIN) liquid 237 mL  237 mL Oral BID BM Raliegh Marsa RAMAN, DO   237 mL at 09/15/24 1430   gabapentin  (NEURONTIN ) capsule 100 mg  100 mg Oral TID Pashayan, Alexander S, DO   100 mg at 09/15/24 1630   lidocaine  (LIDODERM ) 5 % 1 patch  1 patch Transdermal Daily Towana Cory SAILOR, MD   1 patch at 09/13/24 1046   magnesium  hydroxide (MILK OF MAGNESIA) suspension 30 mL  30 mL Oral Daily PRN Mannie Jerel PARAS, NP       nicotine  (NICODERM CQ  - dosed in mg/24 hours) patch 21 mg  21 mg Transdermal Daily Mannie Jerel PARAS, NP       sertraline  (ZOLOFT ) tablet 25 mg  25 mg Oral Daily Pashayan, Alexander S, DO    25 mg at 09/15/24 0801   sodium chloride  (OCEAN) 0.65 % nasal spray 1 spray  1 spray Each Nare BID PRN Bobbitt, Shalon E, NP   1 spray at 09/07/24 1706   traZODone  (DESYREL ) tablet 100-200 mg  100-200 mg Oral QHS PRN Mannie Jerel PARAS, NP   200 mg at 09/15/24 2145    Lab Results:  Results for orders placed or performed during the hospital encounter of 09/05/24 (from the past 48 hours)  Hemoglobin A1c     Status: None   Collection Time: 09/15/24  6:29 AM  Result Value Ref Range   Hgb A1c MFr Bld 5.5 4.8 - 5.6 %    Comment: (NOTE)         Prediabetes: 5.7 - 6.4         Diabetes: >6.4         Glycemic control for adults with diabetes: <7.0    Mean Plasma Glucose 111 mg/dL    Comment: (NOTE) Performed At: [facility] [address] [place], [place] [id number] Jennette Shorter MD Ey:[id number]   Lipid panel     Status: Abnormal   Collection Time: 09/15/24  6:29 AM  Result Value Ref Range   Cholesterol 177 0 - 200 mg/dL    Comment:        ATP III CLASSIFICATION:  <200     mg/dL   Desirable  799-760  mg/dL   Borderline High  >=759    mg/dL   High           Triglycerides 83 <150 mg/dL   HDL 51 >59 mg/dL   Total CHOL/HDL Ratio 3.5 RATIO   VLDL 17 0 - 40 mg/dL   LDL Cholesterol 889 (H) 0 - 99 mg/dL    Comment:        Total Cholesterol/HDL:CHD Risk Coronary Heart Disease Risk Table  Men   Women  1/2 Average Risk   3.4   3.3  Average Risk       5.0   4.4  2 X Average Risk   9.6   7.1  3 X Average Risk  23.4   11.0        Use the calculated Patient Ratio above and the CHD Risk Table to determine the patient's CHD Risk.        ATP III CLASSIFICATION (LDL):  <100     mg/dL   Optimal  899-870  mg/dL   Near or Above                    Optimal  130-159  mg/dL   Borderline  839-810  mg/dL   High  >809     mg/dL   Very High Performed at [facility], [address], [place], [place] [address]   Valproic acid  level     Status:  Abnormal   Collection Time: 09/15/24  6:29 AM  Result Value Ref Range   Valproic Acid  Lvl 23 (L) 50 - 100 ug/mL    Comment: Performed at [facility], [address], [place], [place] [address]    Blood Alcohol level:  Lab Results  Component Value Date   ETH <15 09/04/2024   ETH 26 (H) 08/21/2024    Metabolic Disorder Labs: Lab Results  Component Value Date   HGBA1C 5.5 09/15/2024   MPG 111 09/15/2024   MPG 120 12/31/2022   No results found for: PROLACTIN Lab Results  Component Value Date   CHOL 177 09/15/2024   TRIG 83 09/15/2024   HDL 51 09/15/2024   CHOLHDL 3.5 09/15/2024   VLDL 17 09/15/2024   LDLCALC 110 (H) 09/15/2024   LDLCALC 91 12/31/2022    Physical Findings:  Mental Status exam: Appearance: white male, fairly groomed, seen laying in bed, approximately stated age  Eye contact: good - overall improved, does make appropriate eye contact through most of interview  Attitude towards examiner largely cooperative, less irritable overall Psychomotor: lays in bed with limited movements  Speech: largely normal in rate and prosody, still quiet and reduced amount Language: no delays  Mood: okay, tired Affect: congruent, neutral, overall less irritable, appropriate   Thought content: denies SI and HI, no overt delusions expressed  Thought Process: largely linear and organized  Perception: denying AVH during interview (reports comes and goes), not overtly RTIS  Insight: fair to good - improved insight into mental health concerns and need for treatment Judgement: fair - improving.   Orientation: x3 Attention/Concentration: grossly intact on conversation Memory/Cognition: limited ability to assess at this time   Fund of Knowledge: Average   Musculoskeletal: Strength & Muscle Tone: within normal limits Gait & Station: normal Patient leans: N/A   Physical Exam Constitutional:      Appearance: Normal appearance.  Pulmonary:     Effort:  Pulmonary effort is normal.  Abdominal:     General: There is no distension.  Musculoskeletal:        General: Normal range of motion.  Neurological:     General: No focal deficit present.     Mental Status: He is alert.    Review of Systems  All other systems reviewed and are negative.  Blood pressure 125/73, pulse 79, temperature 98 F (36.7 C), temperature source Oral, resp. rate 16, height 5' 10 (1.778 m), weight 90.7 kg, SpO2 99%. Body mass index is  28.7 kg/m.   Treatment Plan Summary: Daily contact with patient to assess and evaluate symptoms and progress in treatment   Assessment: Cory Floyd is a 48 y.o. male  with a past psychiatric history of bipolar 1 disorder, substance-induced psychosis, stimulant use disorder (methamphetamine, cocaine, alcohol). He has multiple past psychiatric hospitalizations and an ACT team. Patient initially arrived to [facility] on 09/04/24 for hallucinations and paranoid ideation and admitted to [facility] under IVC on 11/14 for acute safety concerns and crisis stabalization.  Patient has a history of bipolar disorder as well as substance-induced psychosis. On this admission he has presented with paranoia, was intermittently responding to internal stimuli, and disorganized in his thought process. Upon arrival, he remained paranoid and uncooperative on exam, demonstrating overt psychotic symptoms and IVC was continued. Home medications (abilify , mirtazapine  and tegretol ) were continued. However, mirtazapine  and tegretol  were discontinued after patient voiced not taking then and depatkote ER was started as he voiced utilizing this with good benefit in the past. Abilify  has continued to be increased. Initially patient demonstrated poor insight and was refusing medications, however has slowly been more cooperative with care and has been medication compliant over the last several days    Today the patient was somewhat withdrawn, continued to voiced fatigue and  intermittent AH but overall improvements. Was less irritable and more engaged in interview than previous interviews with this author last week. Mood appears to be more stable and he was not overtly disorganized, however remains isolative to room. Encouraged today to attend groups. Will continue with current medications and consider further increase in zoloft  in the coming days.    Diagnoses / Active Problems: Substance-induced psychosis  Bipolar 1 Disorder Stimulant use disorder    PLAN: Safety and Monitoring:            --  INVOLUNTARY  admission to inpatient psychiatric unit for safety, stabilization and treatment            -- Daily contact with patient to assess and evaluate symptoms and progress in treatment            -- Patient's case to be discussed in multi-disciplinary team meeting            -- Observation Level : q15 minute checks            -- Vital signs:  q12 hours            -- Precautions: suicide, elopement, and assault   2. Psychiatric Diagnoses and Treatment:  -Continue Abilify  15 mg daily for mood stability and psychosis -Continue Zoloft  25 mg daily for depression -Continue Depakote  1000 mg QHS for mood stability               -- Metabolic profile and EKG monitoring obtained while on an atypical antipsychotic  BMI: 28.7 TSH: WNL Most recent QTC (11/14) of 434   3. Medical Issues Being Addressed:  --none, patient has history of rhabdomyolysis and cellulitis   4. Discharge Planning:             -- Social work and case management to assist with discharge planning and identification of hospital follow-up needs prior to discharge            -- Estimated LOS: 5-7 days            -- Discharge Concerns: Need to establish a safety plan; Medication compliance and effectiveness            -- Discharge  Goals: Return home with outpatient referrals for mental health follow-up including medication management/psychotherapy      I certify that inpatient services furnished  can reasonably be expected to improve the patient's condition.      Cory LOISE Arts, MD 09/16/2024, 8:30 AM

## 2024-09-16 NOTE — BHH Group Notes (Signed)
 Cory Floyd did not attend wrap up group

## 2024-09-16 NOTE — Plan of Care (Signed)
  Problem: Education: Goal: Knowledge of [facility] Education information/materials will improve Outcome: Progressing Goal: Emotional status will improve Outcome: Progressing Goal: Mental status will improve Outcome: Progressing Goal: Verbalization of understanding the information provided will improve Outcome: Progressing   Problem: Activity: Goal: Interest or engagement in activities will improve Outcome: Progressing Goal: Sleeping patterns will improve Outcome: Progressing   Problem: Coping: Goal: Ability to verbalize frustrations and anger appropriately will improve Outcome: Progressing Goal: Ability to demonstrate self-control will improve Outcome: Progressing   Problem: Health Behavior/Discharge Planning: Goal: Identification of resources available to assist in meeting health care needs will improve Outcome: Progressing Goal: Compliance with treatment plan for underlying cause of condition will improve Outcome: Progressing   Problem: Physical Regulation: Goal: Ability to maintain clinical measurements within normal limits will improve Outcome: Progressing   Problem: Safety: Goal: Periods of time without injury will increase Outcome: Progressing  Patient meeting care plan goals.

## 2024-09-16 NOTE — Progress Notes (Signed)
(  Sleep Hours) - 16  (Any PRNs that were needed, meds refused, or side effects to meds)- TRAZODONE  50MG , NO MEDS REFUSED, NO SIDE EFFECTS TO MEDS  (Any disturbances and when (visitation, over night)- N/A (Concerns raised by the patient)- n/a  (SI/HI/AVH)- DENIES

## 2024-09-16 NOTE — Group Note (Signed)
 Date:  09/16/2024 Time:  5:28 PM  Group Topic/Focus:  Goals Group:   The focus of this group is to help patients establish daily goals to achieve during treatment and discuss how the patient can incorporate goal setting into their daily lives to aide in recovery. Orientation:   The focus of this group is to educate the patient on the purpose and policies of crisis stabilization and provide a format to answer questions about their admission.  The group details unit policies and expectations of patients while admitted.    Participation Level:  Did Not Attend  Cory Floyd 09/16/2024, 5:28 PM

## 2024-09-16 NOTE — Group Note (Signed)
 Recreation Therapy Group Note   Group Topic:Animal Assisted Therapy   Group Date: 09/16/2024 Start Time: 9052 End Time: 1030 Facilitators: Cory Floyd, LRT,CTRS Location: [address] Hall Dayroom   Animal-Assisted Activity (AAA) Program Checklist/Progress Notes Patient Eligibility Criteria Checklist & Daily Group note for Rec Tx Intervention  AAA/T Program Assumption of Risk Form signed by Patient/ or Parent Legal Guardian Yes  Patient understands his/her participation is voluntary Yes  Behavioral Response:    Education: Charity Fundraiser, Appropriate Animal Interaction   Education Outcome: Acknowledges education.    Affect/Mood: N/A   Participation Level: Did not attend    Clinical Observations/Individualized Feedback:      Plan: Continue to engage patient in RT group sessions 2-3x/week.   Cory Floyd, LRT,CTRS 09/16/2024 1:34 PM

## 2024-09-16 NOTE — Group Note (Signed)
 LCSW Group Therapy Note   Group Date: 09/16/2024 Start Time: 1100 End Time: 1200   Participation:  did not attend  Type of Therapy:  Group Therapy  Topic:  Stronger Together:  Building Healthy Relationships  Objective:  To explore loneliness, boundaries, and safe ways to build relationships.  Goals: Recognize healthy vs. unhealthy relationships. Learn safe ways to connect with others. Strengthen communication and murphy oil.  Summary:  Participants discussed loneliness, healthy connections, and setting boundaries. They explored safe ways to meet people and shared personal experiences. Key insights were reinforced through discussion and quotes.  Therapeutic Modalities Used: - Cognitive Behavioral Therapy (CBT) Elements - Identifying unhealthy relationship patterns, challenging negative thoughts about connection. - Dialectical Behavior Therapy (DBT) Elements - Interpersonal effectiveness, setting and maintaining boundaries. - Supportive Group Therapy - Peer discussion, shared experiences, and emotional validation.   Justa Hatchell [address], LCSWA 09/16/2024  12:59 PM

## 2024-09-16 NOTE — Group Note (Signed)
 Date:  09/16/2024 Time:  4:14 PM  Group Topic/Focus:  Self Esteem Action Plan:   The focus of this group is to help patients create a plan to continue to build self-esteem after discharge.    Participation Level:  Did Not Attend    Cory Floyd 09/16/2024, 4:14 PM

## 2024-09-16 NOTE — [facility] Group Notes (Signed)
 [facility] Group Notes:  (Nursing/MHT/Case Management/Adjunct)  Date:  09/16/2024  Time:  9:20 PM  Type of Therapy:  Psychoeducational Skills  Participation Level:  Did Not Attend  Participation Quality:  did not attend  Affect:  did not attend  Cognitive:  did not attend  Insight:  None  Engagement in Group:  did not attend   Modes of Intervention:  Education  Summary of Progress/Problems: Patient did not attend group this evening.   Esta Carmon S 09/16/2024, 9:20 PM

## 2024-09-16 NOTE — Plan of Care (Signed)
   Problem: Education: Goal: Emotional status will improve Outcome: Progressing Goal: Mental status will improve Outcome: Progressing   Problem: Activity: Goal: Interest or engagement in activities will improve Outcome: Progressing

## 2024-09-17 ENCOUNTER — Encounter (HOSPITAL_COMMUNITY): Payer: Self-pay

## 2024-09-17 DIAGNOSIS — F1520 Other stimulant dependence, uncomplicated: Secondary | ICD-10-CM | POA: Diagnosis not present

## 2024-09-17 DIAGNOSIS — F319 Bipolar disorder, unspecified: Secondary | ICD-10-CM | POA: Diagnosis not present

## 2024-09-17 DIAGNOSIS — F19959 Other psychoactive substance use, unspecified with psychoactive substance-induced psychotic disorder, unspecified: Secondary | ICD-10-CM | POA: Diagnosis not present

## 2024-09-17 MED ORDER — SERTRALINE HCL 50 MG PO TABS
50.0000 mg | ORAL_TABLET | Freq: Every day | ORAL | Status: DC
  Administered 2024-09-18 – 2024-09-19 (×2): 50 mg via ORAL
  Filled 2024-09-17: qty 1
  Filled 2024-09-17: qty 7
  Filled 2024-09-17: qty 1

## 2024-09-17 NOTE — Progress Notes (Signed)

## 2024-09-17 NOTE — BH Specialist Note (Signed)
 CSW provided Patient with homeless resources

## 2024-09-17 NOTE — Group Note (Signed)
 Date:  09/17/2024 Time:  4:55 PM  Group Topic/Focus: Sleep Hygiene Dimensions of Wellness:   The focus of this group is to introduce the topic of wellness and discuss the role each dimension of wellness plays in total health.    Participation Level:  Did Not Attend   Cory Floyd 09/17/2024, 4:55 PM

## 2024-09-17 NOTE — Progress Notes (Signed)
 [facility] MD Progress Note  09/17/2024 9:32 AM Cory Floyd  MRN:  [id number]  Principal Problem: Psychoactive substance-induced psychosis (HCC) Diagnosis: Principal Problem:   Psychoactive substance-induced psychosis (HCC) Active Problems:   Bipolar affective disorder, current episode manic (HCC)   Severe stimulant use disorder (HCC)  Total Time spent with patient: 35 minutes  Identifying Information and reason for admission The patient is a 48 y.o. male with a psychiatric history of underlying bipolar 1 disorder as well as a history of substance-induced psychosis and severe stimulant use disorder. He presents on this admission with manic and psychotic symptoms in the community (bizarre behavior at the bank) in the context of substance use - admitted under IVC.  Interval events: Per chart review the patient has continued to isolate to room - not coming out to groups and staying in bed all day.  Endorsing high anxiety and depression but denying SI, HI and AVH. Was a bit more alert and engaging with peers somewhat in the afternoon/evening. Slept 8.75 hrs overnight and was medication compliant. BP 117/70 (BP Location: Left Arm)   Pulse 96   Temp 98 F (36.7 C) (Oral)   Resp 16   Ht 5' 10 (1.778 m)   Wt 90.7 kg   SpO2 100%   BMI 28.70 kg/m    Interview today:  Today the patient states he is good. He did toss and turn a bit last night but mood has overall been better. This dino advised coming out of the room and walking around to expend some energy, possibly going to groups. Patient states he has gone to some but then deflects by saying he feels like a prisoner here. This dino offered to discuss plans for discharge if he is open to speaking with social work. Patient states he is willing to speak with a child psychotherapist today. Before he came into the hospital he was couch crashing at various places but states no one will put him up anymore at this point. He wants to know what  alternatives there may be. Does think his medications are fine. Denying SI, HI and AVH today. No other concerns.     Past Psychiatric Hx: Current Psychiatrist: none, was previously seen by ACT team but dropped due to medication noncompliance related to loss of provider  Current Therapist: none reported in chart review  Previous Psychiatric Diagnoses: Bipolar I disorder, substance-induced psychosis, cocaine abuse with cocaine-induced mood disorder Most recent psychiatric medications: trileptal  300 BID, remeron  15, abilify  20mg  at bedtime (patient reported not taking any of these - had most recently been on depakote  + abilify  LAI but had been off all meds for several months) Psychiatric medication history/compliance: history of medication non-compliance. Past medications include depakote , this was discontinued in the setting of medication non-compliance. Psychiatric Hospitalization hx: [facility] 12/2022 (SI, depression), 04/2021 (SI), 03/2021 (SI with plan to shoot self with gun), 01/2021 (aggressive behaviors and SI/HI threats) Psychotherapy hx: unknown Neuromodulation history: none History of suicide: yes, via IV drug use History of homicide or aggression (obtained in HPI): yes, 01/2021 hospitalization   Substance Abuse Hx: (Frequency, quantity, last use, impact) UDS+cocaine, amphetamines.  Patient has a history of IVDU.  Per chart review, patient has past history of stimulant use disorder (meth, cocaine) and tobacco use   Past Medical History:  Past Medical History:  Diagnosis Date   Alcoholic (HCC)    clean for 3 years   Anginal pain    Arthritis    Bipolar 1 disorder (HCC)  COVID-19    Depressed    Diabetes mellitus without complication (HCC)    Dyspnea    Fatty liver    Hypertension    Schizophrenia [facility])     Past Surgical History:  Procedure Laterality Date   COLONOSCOPY  02/05/2014   diverticulosis, hyperplastic polyp x 2   fracture arm Right    INCISION AND DRAINAGE ABSCESS  Bilateral 12/29/2022   Procedure: INCISION AND DRAINAGE ABSCESS BILATERAL ARMS;  Surgeon: Kendal Franky SQUIBB, MD;  Location: [facility] OR;  Service: Orthopedics;  Laterality: Bilateral;   KNEE SURGERY Right 2013   NASAL SEPTUM SURGERY     NASAL TURBINATE REDUCTION Bilateral 12/12/2016   Procedure: TURBINATE REDUCTION/SUBMUCOSAL RESECTION;  Surgeon: Chinita Hasten, MD;  Location: [facility] ORS;  Service: ENT;  Laterality: Bilateral;   Family History:  Family History  Problem Relation Age of Onset   Hepatitis C Mother    Cancer Mother    Cancer Maternal Aunt    Hypertension Maternal Grandmother     Social History:  Social History   Substance and Sexual Activity  Alcohol Use Yes   Comment: 1/2-5th pint of liquor     Social History   Substance and Sexual Activity  Drug Use Yes   Types: Cocaine, Marijuana   Comment: heroin    Social History   Socioeconomic History   Marital status: Divorced    Spouse name: Not on file   Number of children: Not on file   Years of education: Not on file   Highest education level: Not on file  Occupational History   Not on file  Tobacco Use   Smoking status: Every Day    Current packs/day: 1.00    Average packs/day: 1 pack/day for 20.0 years (20.0 ttl pk-yrs)    Types: Cigarettes   Smokeless tobacco: Never  Vaping Use   Vaping status: Never Used  Substance and Sexual Activity   Alcohol use: Yes    Comment: 1/2-5th pint of liquor   Drug use: Yes    Types: Cocaine, Marijuana    Comment: heroin   Sexual activity: Yes    Birth control/protection: None  Other Topics Concern   Not on file  Social History Narrative   Not on file   Social Drivers of Health   Financial Resource Strain: Not on file  Food Insecurity: Food Insecurity Present (09/05/2024)   Hunger Vital Sign    Worried About Programme Researcher, Broadcasting/film/video in the Last Year: Often true    Ran Out of Food in the Last Year: Often true  Transportation Needs: Unmet Transportation Needs (09/05/2024)    PRAPARE - Administrator, Civil Service (Medical): Yes    Lack of Transportation (Non-Medical): Yes  Physical Activity: Not on file  Stress: Not on file  Social Connections: Not on file    Current Medications: Current Facility-Administered Medications  Medication Dose Route Frequency Provider Last Rate Last Admin   acetaminophen  (TYLENOL ) tablet 650 mg  650 mg Oral Q6H PRN Mannie Jerel PARAS, NP   650 mg at 09/16/24 2137   alum & mag hydroxide-simeth (MAALOX/MYLANTA) 200-200-20 MG/5ML suspension 30 mL  30 mL Oral Q4H PRN Mannie Jerel PARAS, NP       ARIPiprazole  (ABILIFY ) tablet 15 mg  15 mg Oral Daily Pashayan, Alexander S, DO   15 mg at 09/17/24 0827   haloperidol  (HALDOL ) tablet 5 mg  5 mg Oral TID PRN Mannie Jerel PARAS, NP       And  diphenhydrAMINE  (BENADRYL ) capsule 50 mg  50 mg Oral TID PRN Mannie Jerel PARAS, NP       haloperidol  lactate (HALDOL ) injection 10 mg  10 mg Intramuscular TID PRN Mannie Jerel PARAS, NP       And   diphenhydrAMINE  (BENADRYL ) injection 50 mg  50 mg Intramuscular TID PRN Mannie Jerel PARAS, NP       And   LORazepam  (ATIVAN ) injection 2 mg  2 mg Intramuscular TID PRN Mannie Jerel PARAS, NP       haloperidol  lactate (HALDOL ) injection 5 mg  5 mg Intramuscular TID PRN Mannie Jerel PARAS, NP       And   diphenhydrAMINE  (BENADRYL ) injection 50 mg  50 mg Intramuscular TID PRN Mannie Jerel PARAS, NP       And   LORazepam  (ATIVAN ) injection 2 mg  2 mg Intramuscular TID PRN Mannie Jerel PARAS, NP       divalproex  (DEPAKOTE  ER) 24 hr tablet 1,000 mg  1,000 mg Oral QHS Malaina Mortellaro N, MD   1,000 mg at 09/16/24 2137   feeding supplement (ENSURE PLUS HIGH PROTEIN) liquid 237 mL  237 mL Oral BID BM Pashayan, Alexander S, DO   237 mL at 09/17/24 0827   gabapentin  (NEURONTIN ) capsule 100 mg  100 mg Oral TID Pashayan, Alexander S, DO   100 mg at 09/17/24 0827   lidocaine  (LIDODERM ) 5 % 1 patch  1 patch Transdermal Daily Towana Leita SAILOR, MD   1 patch at 09/13/24 1046    magnesium  hydroxide (MILK OF MAGNESIA) suspension 30 mL  30 mL Oral Daily PRN Mannie Jerel PARAS, NP       nicotine  (NICODERM CQ  - dosed in mg/24 hours) patch 21 mg  21 mg Transdermal Daily Mannie Jerel PARAS, NP       sertraline  (ZOLOFT ) tablet 25 mg  25 mg Oral Daily Pashayan, Alexander S, DO   25 mg at 09/17/24 0827   sodium chloride  (OCEAN) 0.65 % nasal spray 1 spray  1 spray Each Nare BID PRN Bobbitt, Shalon E, NP   1 spray at 09/07/24 1706   traZODone  (DESYREL ) tablet 100-200 mg  100-200 mg Oral QHS PRN Mannie Jerel PARAS, NP   200 mg at 09/16/24 2138    Lab Results:  No results found for this or any previous visit (from the past 48 hours).   Blood Alcohol level:  Lab Results  Component Value Date   ETH <15 09/04/2024   ETH 26 (H) 08/21/2024    Metabolic Disorder Labs: Lab Results  Component Value Date   HGBA1C 5.5 09/15/2024   MPG 111 09/15/2024   MPG 120 12/31/2022   No results found for: PROLACTIN Lab Results  Component Value Date   CHOL 177 09/15/2024   TRIG 83 09/15/2024   HDL 51 09/15/2024   CHOLHDL 3.5 09/15/2024   VLDL 17 09/15/2024   LDLCALC 110 (H) 09/15/2024   LDLCALC 91 12/31/2022    Physical Findings:  Mental Status exam: Appearance: white male, fairly groomed, seen laying in bed, approximately stated age  Eye contact: good - improved Attitude towards examiner largely cooperative, remains less irritable overall  Psychomotor: lays in bed with limited movements  Speech: largely normal in rate and prosody, still quiet and reduced amount Language: no delays  Mood: good Affect: congruent, neutral-euthymic; appropriate   Thought content: denies SI and HI, no overt delusions expressed  Thought Process: largely linear and organized  Perception: denying AVH during interview (reports comes  and goes), not overtly RTIS  Insight: fair to good - improved insight into mental health concerns and need for treatment Judgement: fair - improving.   Orientation:  x3 Attention/Concentration: grossly intact on conversation Memory/Cognition: limited ability to assess at this time   Fund of Knowledge: Average   Musculoskeletal: Strength & Muscle Tone: within normal limits Gait & Station: normal Patient leans: N/A   Physical Exam Constitutional:      Appearance: Normal appearance.  Pulmonary:     Effort: Pulmonary effort is normal.  Abdominal:     General: There is no distension.  Musculoskeletal:        General: Normal range of motion.  Neurological:     General: No focal deficit present.     Mental Status: He is alert.    Review of Systems  All other systems reviewed and are negative.  Blood pressure 117/70, pulse 96, temperature 98 F (36.7 C), temperature source Oral, resp. rate 16, height 5' 10 (1.778 m), weight 90.7 kg, SpO2 100%. Body mass index is 28.7 kg/m.   Treatment Plan Summary: Daily contact with patient to assess and evaluate symptoms and progress in treatment   Assessment: Cory Floyd is a 48 y.o. male  with a past psychiatric history of bipolar 1 disorder, substance-induced psychosis, stimulant use disorder (methamphetamine, cocaine, alcohol). He has multiple past psychiatric hospitalizations and an ACT team. Patient initially arrived to [facility] on 09/04/24 for hallucinations and paranoid ideation and admitted to [facility] under IVC on 11/14 for acute safety concerns and crisis stabalization.  Patient has a history of bipolar disorder as well as substance-induced psychosis. On this admission he has presented with paranoia, was intermittently responding to internal stimuli, and disorganized in his thought process. Upon arrival, he remained paranoid and uncooperative on exam, demonstrating overt psychotic symptoms and IVC was continued. Home medications (abilify , mirtazapine  and tegretol ) were continued. However, mirtazapine  and tegretol  were discontinued after patient voiced not taking then and depatkote ER was started as he voiced  utilizing this with good benefit in the past. Abilify  has continued to be increased. Initially patient demonstrated poor insight and was refusing medications, however has slowly been more cooperative with care and has been medication compliant over the last several days   As of 11/25, the patient was somewhat withdrawn, continued to voiced fatigue and intermittent AH but overall improvements. Was less irritable and more engaged in interview than previous interviews with this author the prior week. Mood appears to be more stable and he was not overtly disorganized, however remains isolative to room. Encouraged today to attend groups but did not attend any yesterday either. Today the patient was again largely neutral-euthymic in affect, endorsed generally good mood and desire to leave the hospital. Was encouraged to participate in SW interview so we can move on to next steps and voiced intent to do this today. Patient appears to be nearing baseline, although difficult to fully gauge due to isolation in room. Will continue to encourage he get up out of bed and interact with others. No medication changes at this time.      Diagnoses / Active Problems: Substance-induced psychosis  Bipolar 1 Disorder Stimulant use disorder    PLAN: Safety and Monitoring:            --  INVOLUNTARY  admission to inpatient psychiatric unit for safety, stabilization and treatment            -- Daily contact with patient to assess and evaluate symptoms  and progress in treatment            -- Patient's case to be discussed in multi-disciplinary team meeting            -- Observation Level : q15 minute checks            -- Vital signs:  q12 hours            -- Precautions: suicide, elopement, and assault   2. Psychiatric Diagnoses and Treatment:  -Continue Abilify  15 mg daily for mood stability and psychosis -Continue Zoloft  25 mg daily for depression -Continue Depakote  1000 mg QHS for mood stability               --  Metabolic profile and EKG monitoring obtained while on an atypical antipsychotic  BMI: 28.7 TSH: WNL Most recent QTC (11/14) of 434   3. Medical Issues Being Addressed:  --none, patient has history of rhabdomyolysis and cellulitis   4. Discharge Planning:             -- Social work and case management to assist with discharge planning and identification of hospital follow-up needs prior to discharge            -- Estimated LOS: 5-7 days            -- Discharge Concerns: Need to establish a safety plan; Medication compliance and effectiveness            -- Discharge Goals: Return home with outpatient referrals for mental health follow-up including medication management/psychotherapy      I certify that inpatient services furnished can reasonably be expected to improve the patient's condition.      Leita LOISE Arts, MD 09/17/2024, 9:32 AM

## 2024-09-17 NOTE — Group Note (Signed)
 Recreation Therapy Group Note   Group Topic:Relaxation  Group Date: 09/17/2024 Start Time: 0940 End Time: 1024 Facilitators: Becky Colan-McCall, LRT,CTRS Location: [address] Dayroom   Group Topic: Music Therapy  Goal Area(s) Addresses:  Patient will identify positive affects of music. Patient will identify benefits of using music post d/c.  Behavioral Response:   Intervention: Youtube, Music  Activity : Patients were given the opportunity to choose songs on YouTube that have meaning to them. Patients would then explain the importance of their songs. Song selections had to be clean and appropriate for group.   Education:  Stress Management, Discharge Planning.   Education Outcome: Acknowledges Education   Affect/Mood: N/A   Participation Level: Did not attend    Clinical Observations/Individualized Feedback:      Plan: Continue to engage patient in RT group sessions 2-3x/week.   Cory Floyd, LRT,CTRS 09/17/2024 1:41 PM

## 2024-09-17 NOTE — Progress Notes (Signed)
   09/17/24 0830  Psych Admission Type (Psych Patients Only)  Admission Status Involuntary  Psychosocial Assessment  Patient Complaints Depression  Eye Contact Brief  Facial Expression Flat  Affect Flat  Speech Logical/coherent  Interaction Minimal  Motor Activity Slow  Appearance/Hygiene Unremarkable  Behavior Characteristics Cooperative;Appropriate to situation  Mood Pleasant  Aggressive Behavior  Effect No apparent injury  Thought Process  Coherency WDL  Content Blaming self  Delusions None reported or observed  Perception WDL  Hallucination None reported or observed  Judgment Poor  Confusion None  Danger to Self  Current suicidal ideation? Denies  Agreement Not to Harm Self Yes  Description of Agreement Verbal  Danger to Others  Danger to Others None reported or observed

## 2024-09-17 NOTE — Progress Notes (Signed)
(  Sleep Hours) -8.75  (Any PRNs that were needed, meds refused, or side effects to meds)- Trazodone   (Any disturbances and when (visitation, over night)- None  (Concerns raised by the patient)- None  (SI/HI/AVH)- Denies

## 2024-09-17 NOTE — [facility] Group Notes (Signed)
 Patient did not attend the Recreation Therapy group.

## 2024-09-17 NOTE — [facility] Group Notes (Signed)
 Patient did not attend the Goals group.

## 2024-09-17 NOTE — Plan of Care (Signed)
  Problem: Education: Goal: Knowledge of [place] [facility] Education information/materials will improve Outcome: Progressing   Problem: Education: Goal: Verbalization of understanding the information provided will improve Outcome: Progressing   Problem: Activity: Goal: Interest or engagement in activities will improve Outcome: Progressing

## 2024-09-17 NOTE — [facility] Group Notes (Signed)
 [name] did not attend wrap up group

## 2024-09-17 NOTE — BH IP Treatment Plan (Signed)
 Interdisciplinary Treatment and Diagnostic Plan Update  09/17/2024 Time of Session: 11:30am-UPDATE Cory Floyd MRN: [id number]  Principal Diagnosis: Psychoactive substance-induced psychosis (HCC)  Secondary Diagnoses: Principal Problem:   Psychoactive substance-induced psychosis (HCC) Active Problems:   Bipolar affective disorder, current episode manic (HCC)   Severe stimulant use disorder (HCC)   Current Medications:  Current Facility-Administered Medications  Medication Dose Route Frequency Provider Last Rate Last Admin   acetaminophen  (TYLENOL ) tablet 650 mg  650 mg Oral Q6H PRN Mannie Jerel PARAS, NP   650 mg at 09/16/24 2137   alum & mag hydroxide-simeth (MAALOX/MYLANTA) 200-200-20 MG/5ML suspension 30 mL  30 mL Oral Q4H PRN Mannie Jerel PARAS, NP       ARIPiprazole  (ABILIFY ) tablet 15 mg  15 mg Oral Daily Pashayan, Alexander S, DO   15 mg at 09/17/24 0827   haloperidol  (HALDOL ) tablet 5 mg  5 mg Oral TID PRN Mannie Jerel PARAS, NP       And   diphenhydrAMINE  (BENADRYL ) capsule 50 mg  50 mg Oral TID PRN Mannie Jerel PARAS, NP       haloperidol  lactate (HALDOL ) injection 10 mg  10 mg Intramuscular TID PRN Mannie Jerel PARAS, NP       And   diphenhydrAMINE  (BENADRYL ) injection 50 mg  50 mg Intramuscular TID PRN Mannie Jerel PARAS, NP       And   LORazepam  (ATIVAN ) injection 2 mg  2 mg Intramuscular TID PRN Mannie Jerel PARAS, NP       haloperidol  lactate (HALDOL ) injection 5 mg  5 mg Intramuscular TID PRN Mannie Jerel PARAS, NP       And   diphenhydrAMINE  (BENADRYL ) injection 50 mg  50 mg Intramuscular TID PRN Mannie Jerel PARAS, NP       And   LORazepam  (ATIVAN ) injection 2 mg  2 mg Intramuscular TID PRN Mannie Jerel PARAS, NP       divalproex  (DEPAKOTE  ER) 24 hr tablet 1,000 mg  1,000 mg Oral QHS Butler, Laura N, MD   1,000 mg at 09/16/24 2137   feeding supplement (ENSURE PLUS HIGH PROTEIN) liquid 237 mL  237 mL Oral BID BM Pashayan, Alexander S, DO   237 mL at 09/17/24 0827   gabapentin   (NEURONTIN ) capsule 100 mg  100 mg Oral TID Pashayan, Alexander S, DO   100 mg at 09/17/24 0827   lidocaine  (LIDODERM ) 5 % 1 patch  1 patch Transdermal Daily Towana Leita SAILOR, MD   1 patch at 09/13/24 1046   magnesium  hydroxide (MILK OF MAGNESIA) suspension 30 mL  30 mL Oral Daily PRN Mannie Jerel PARAS, NP       nicotine  (NICODERM CQ  - dosed in mg/24 hours) patch 21 mg  21 mg Transdermal Daily Mannie Jerel PARAS, NP       [START ON 09/18/2024] sertraline  (ZOLOFT ) tablet 50 mg  50 mg Oral Daily Towana Leita SAILOR, MD       sodium chloride  (OCEAN) 0.65 % nasal spray 1 spray  1 spray Each Nare BID PRN Bobbitt, Shalon E, NP   1 spray at 09/07/24 1706   traZODone  (DESYREL ) tablet 100-200 mg  100-200 mg Oral QHS PRN Mannie Jerel PARAS, NP   200 mg at 09/16/24 2138   PTA Medications: Medications Prior to Admission  Medication Sig Dispense Refill Last Dose/Taking   acetaminophen  (TYLENOL ) 500 MG tablet Take 500-1,500 mg by mouth every 6 (six) hours as needed for moderate pain (pain score 4-6) (pt states he has  rheumatoid arthritis).      ARIPiprazole  (ABILIFY ) 20 MG tablet Take 20 mg by mouth at bedtime.      aspirin-acetaminophen -caffeine (EXCEDRIN EXTRA STRENGTH) 250-250-65 MG tablet Take 2 tablets by mouth every 6 (six) hours as needed for headache.      ibuprofen  (ADVIL ) 200 MG tablet Take 400 mg by mouth every 6 (six) hours as needed for moderate pain (pain score 4-6).      lidocaine  (XYLOCAINE ) 5 % ointment Apply 1 Application topically as needed. 35.44 g 0    mirtazapine  (REMERON ) 15 MG tablet Take 15 mg by mouth at bedtime.      naproxen  sodium (ALEVE ) 220 MG tablet Take 440 mg by mouth 2 (two) times daily as needed (rheumatoid arthritis per patient).      Oxcarbazepine  (TRILEPTAL ) 300 MG tablet Take 300 mg by mouth 2 (two) times daily.      traZODone  (DESYREL ) 100 MG tablet Take 100-200 mg by mouth at bedtime as needed for sleep. Take one-two tablets (100mg -200mg ) by mouth at bedtime as needed.        Patient Stressors: Financial difficulties   Medication change or noncompliance   Substance abuse    Patient Strengths: Capable of independent living   Treatment Modalities: Medication Management, Group therapy, Case management,  1 to 1 session with clinician, Psychoeducation, Recreational therapy.   Physician Treatment Plan for Primary Diagnosis: Psychoactive substance-induced psychosis (HCC) Long Term Goal(s): Improvement in symptoms so as ready for discharge   Short Term Goals: Ability to identify changes in lifestyle to reduce recurrence of condition will improve Ability to verbalize feelings will improve Ability to demonstrate self-control will improve Ability to identify and develop effective coping behaviors will improve Ability to maintain clinical measurements within normal limits will improve Compliance with prescribed medications will improve Ability to identify triggers associated with substance abuse/mental health issues will improve  Medication Management: Evaluate patient's response, side effects, and tolerance of medication regimen.  Therapeutic Interventions: 1 to 1 sessions, Unit Group sessions and Medication administration.  Evaluation of Outcomes: Progressing  Physician Treatment Plan for Secondary Diagnosis: Principal Problem:   Psychoactive substance-induced psychosis (HCC) Active Problems:   Bipolar affective disorder, current episode manic (HCC)   Severe stimulant use disorder (HCC)  Long Term Goal(s): Improvement in symptoms so as ready for discharge   Short Term Goals: Ability to identify changes in lifestyle to reduce recurrence of condition will improve Ability to verbalize feelings will improve Ability to demonstrate self-control will improve Ability to identify and develop effective coping behaviors will improve Ability to maintain clinical measurements within normal limits will improve Compliance with prescribed medications will  improve Ability to identify triggers associated with substance abuse/mental health issues will improve     Medication Management: Evaluate patient's response, side effects, and tolerance of medication regimen.  Therapeutic Interventions: 1 to 1 sessions, Unit Group sessions and Medication administration.  Evaluation of Outcomes: Progressing   RN Treatment Plan for Primary Diagnosis: Psychoactive substance-induced psychosis (HCC) Long Term Goal(s): Knowledge of disease and therapeutic regimen to maintain health will improve  Short Term Goals: Ability to remain free from injury will improve, Ability to verbalize frustration and anger appropriately will improve, Ability to verbalize feelings will improve, and Ability to disclose and discuss suicidal ideas  Medication Management: RN will administer medications as ordered by provider, will assess and evaluate patient's response and provide education to patient for prescribed medication. RN will report any adverse and/or side effects to prescribing provider.  Therapeutic  Interventions: 1 on 1 counseling sessions, Psychoeducation, Medication administration, Evaluate responses to treatment, Monitor vital signs and CBGs as ordered, Perform/monitor CIWA, COWS, AIMS and Fall Risk screenings as ordered, Perform wound care treatments as ordered.  Evaluation of Outcomes: Progressing   LCSW Treatment Plan for Primary Diagnosis: Psychoactive substance-induced psychosis (HCC) Long Term Goal(s): Safe transition to appropriate next level of care at discharge, Engage patient in therapeutic group addressing interpersonal concerns.  Short Term Goals: Engage patient in aftercare planning with referrals and resources, Increase ability to appropriately verbalize feelings, Facilitate acceptance of mental health diagnosis and concerns, and Identify triggers associated with mental health/substance abuse issues  Therapeutic Interventions: Assess for all discharge  needs, 1 to 1 time with Social worker, Explore available resources and support systems, Assess for adequacy in community support network, Educate family and significant other(s) on suicide prevention, Complete Psychosocial Assessment, Interpersonal group therapy.  Evaluation of Outcomes: Progressing   Progress in Treatment: Attending groups: No. Participating in groups: No. Taking medication as prescribed: Yes. Toleration medication: Yes  Family/Significant other contact made: No, will contact:  consents are pending Patient understands diagnosis: No. Discussing patient identified problems/goals with staff: No. Medical problems stabilized or resolved: Yes. Denies suicidal/homicidal ideation: Yes. Issues/concerns per patient self-inventory: No. None reported.   New problem(s) identified: No, Describe:  None identified.   New Short Term/Long Term Goal(s): detox, medication management for mood stabilization; elimination of SI thoughts; development of comprehensive mental wellness/sobriety plan    Patient Goals: Patient refused to participate.    Discharge Plan or Barriers: Patient recently admitted. CSW will continue to follow and assess for appropriate referrals and possible discharge planning.     Reason for Continuation of Hospitalization: Delusions  Mania Medication stabilization Withdrawal symptoms   Estimated Length of Stay:  3-4 days  Last 3 [place] Suicide Severity Risk Score: Flowsheet Row Ad[facility] (Current) [facility] from 09/04/2024 in [facility] from 08/21/2024 in [facility] Department at [facility]  C-SSRS RISK CATEGORY No Risk No Risk No Risk    Last [facility] 2/9 Scores:     No data to display          Scribe for Treatment Team: Nat CHRISTELLA Salter, KEN 09/17/2024 9:39 AM

## 2024-09-18 DIAGNOSIS — F19959 Other psychoactive substance use, unspecified with psychoactive substance-induced psychotic disorder, unspecified: Secondary | ICD-10-CM | POA: Diagnosis not present

## 2024-09-18 DIAGNOSIS — F1520 Other stimulant dependence, uncomplicated: Secondary | ICD-10-CM | POA: Diagnosis not present

## 2024-09-18 DIAGNOSIS — F319 Bipolar disorder, unspecified: Secondary | ICD-10-CM | POA: Diagnosis not present

## 2024-09-18 NOTE — Group Note (Signed)
 Date:  09/18/2024 Time:  10:10 AM  Group Topic/Focus: physical wellness orientation goals group Dimensions of Wellness:   The focus of this group is to introduce the topic of wellness and discuss the role each dimension of wellness plays in total health. Orientation:   The focus of this group is to educate the patient on the purpose and policies of crisis stabilization and provide a format to answer questions about their admission.  The group details unit policies and expectations of patients while admitted. Wellness Toolbox:   The focus of this group is to discuss various aspects of wellness, balancing those aspects and exploring ways to increase the ability to experience wellness.  Patients will create a wellness toolbox for use upon discharge.    Participation Level:  Did Not Attend   Cory Floyd 09/18/2024, 10:10 AM

## 2024-09-18 NOTE — Plan of Care (Signed)
   Problem: Activity: Goal: Sleeping patterns will improve Outcome: Progressing   Problem: Safety: Goal: Periods of time without injury will increase Outcome: Progressing

## 2024-09-18 NOTE — [facility] Suicide Risk Assessment (Signed)
 [facility] INPATIENT:  Family/Significant Other Suicide Prevention Education  Suicide Prevention Education:  Patient Refusal for Family/Significant Other Suicide Prevention Education: The patient Cory Floyd has refused to provide written consent for family/significant other to be provided Family/Significant Other Suicide Prevention Education during admission and/or prior to discharge.  Physician notified.  Jenkins LULLA Primer 09/18/2024, 9:23 AM

## 2024-09-18 NOTE — Group Note (Signed)
 LCSW Group Therapy Note   Group Date: 09/18/2024 Start Time: 1100 End Time: 1200   Participation:  did not attend  Type of Therapy:  Group Therapy  Topic:  Lifestyle:  from "One Day" to "Today is Day One"  Objective:  To promote mental and physical well-being through lifestyle changes in routine, nutrition, sleep, and movement.  Goals: Increase awareness of how lifestyle habits impact mental health. Encourage one small, achievable wellness goal. Support group sharing and accountability.  Summary:  Group members explored how daily habits influence mental health and discussed the importance of starting with small, manageable changes. Participants identified personal goals and shared reflections on improving structure, sleep, diet, and physical activity.  Therapeutic Modalities: CBT - Identifying and challenging all-or-nothing thinking; promoting realistic, helpful thoughts about change. Psychoeducation - Teaching about the impact of sleep, nutrition, movement, and routine on mental health. Motivational Interviewing - Eliciting personal motivation and exploring readiness for change. Goal-Setting - Supporting SMART goals to build self-efficacy and encourage follow-through.   Dierks Wach O [employer] Pham, LCSWA 09/18/2024  12:38 PM

## 2024-09-18 NOTE — Progress Notes (Signed)
(  Sleep Hours) - 14.25 (Any PRNs that were needed, meds refused, or side effects to meds)- Trazodone  (Any disturbances and when (visitation, over night)- None (Concerns raised by the patient)- None (SI/HI/AVH)- -SI/HI, +AVH
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
## 2024-09-18 NOTE — Group Note (Signed)
 Date:  09/18/2024 Time:  3:46 PM  Group Topic/Focus: Emotional wellness  Emotional Education:   The focus of this group is to discuss what feelings/emotions are, and how they are experienced.    Participation Level:  Did Not Attend   Cory Floyd 09/18/2024, 3:46 PM

## 2024-09-18 NOTE — Group Note (Signed)
 Date:  09/18/2024 Time:  11:36 AM  Group Topic/Focus: Social work  Participation Level:  Did Not Attend   Cory Floyd 09/18/2024, 11:36 AM

## 2024-09-19 ENCOUNTER — Other Ambulatory Visit: Payer: Self-pay

## 2024-09-19 ENCOUNTER — Emergency Department (HOSPITAL_COMMUNITY)
Admission: EM | Admit: 2024-09-19 | Discharge: 2024-09-20 | Disposition: A | Discharge status: Home / Self Care | Attending: Emergency Medicine | Admitting: Emergency Medicine

## 2024-09-19 DIAGNOSIS — I10 Essential (primary) hypertension: Secondary | ICD-10-CM | POA: Diagnosis not present

## 2024-09-19 DIAGNOSIS — F22 Delusional disorders: Secondary | ICD-10-CM | POA: Diagnosis present

## 2024-09-19 DIAGNOSIS — F1520 Other stimulant dependence, uncomplicated: Secondary | ICD-10-CM | POA: Diagnosis not present

## 2024-09-19 DIAGNOSIS — F29 Unspecified psychosis not due to a substance or known physiological condition: Secondary | ICD-10-CM | POA: Diagnosis not present

## 2024-09-19 DIAGNOSIS — F319 Bipolar disorder, unspecified: Secondary | ICD-10-CM | POA: Diagnosis not present

## 2024-09-19 DIAGNOSIS — Z79899 Other long term (current) drug therapy: Secondary | ICD-10-CM | POA: Insufficient documentation

## 2024-09-19 DIAGNOSIS — E119 Type 2 diabetes mellitus without complications: Secondary | ICD-10-CM | POA: Insufficient documentation

## 2024-09-19 DIAGNOSIS — Z765 Malingerer [conscious simulation]: Secondary | ICD-10-CM | POA: Insufficient documentation

## 2024-09-19 DIAGNOSIS — F19959 Other psychoactive substance use, unspecified with psychoactive substance-induced psychotic disorder, unspecified: Secondary | ICD-10-CM | POA: Diagnosis not present

## 2024-09-19 LAB — COMPREHENSIVE METABOLIC PANEL WITH GFR
ALT: 21 U/L (ref 0–44)
AST: 26 U/L (ref 15–41)
Albumin: 4.5 g/dL (ref 3.5–5.0)
Alkaline Phosphatase: 64 U/L (ref 38–126)
Anion gap: 14 (ref 5–15)
BUN: 15 mg/dL (ref 6–20)
CO2: 23 mmol/L (ref 22–32)
Calcium: 9 mg/dL (ref 8.9–10.3)
Chloride: 101 mmol/L (ref 98–111)
Creatinine, Ser: 0.87 mg/dL (ref 0.61–1.24)
GFR, Estimated: >60 mL/min (ref >60)
Glucose, Bld: 118 mg/dL — ABNORMAL HIGH (ref 70–99)
Potassium: 3.7 mmol/L (ref 3.5–5.1)
Sodium: 138 mmol/L (ref 135–145)
Total Bilirubin: 0.6 mg/dL (ref 0.0–1.2)
Total Protein: 8.2 g/dL — ABNORMAL HIGH (ref 6.5–8.1)

## 2024-09-19 LAB — CBC
HCT: 44.3 % (ref 39.0–52.0)
Hemoglobin: 14.7 g/dL (ref 13.0–17.0)
MCH: 30.6 pg (ref 26.0–34.0)
MCHC: 33.2 g/dL (ref 30.0–36.0)
MCV: 92.1 fL (ref 80.0–100.0)
Platelets: 186 K/uL (ref 150–400)
RBC: 4.81 MIL/uL (ref 4.22–5.81)
RDW: 13.1 % (ref 11.5–15.5)
WBC: 11.7 K/uL — ABNORMAL HIGH (ref 4.0–10.5)
nRBC: 0 % (ref 0.0–0.2)

## 2024-09-19 LAB — ETHANOL: Alcohol, Ethyl (B): <15 mg/dL (ref <15)

## 2024-09-19 MED ORDER — TRAZODONE HCL 100 MG PO TABS: 100.0000 mg | ORAL_TABLET | Freq: Every evening | ORAL | 0 refills | Status: DC | PRN

## 2024-09-19 MED ORDER — ARIPIPRAZOLE 15 MG PO TABS: 15.0000 mg | ORAL_TABLET | Freq: Every day | ORAL | 0 refills | Status: DC

## 2024-09-19 MED ORDER — SERTRALINE HCL 50 MG PO TABS: 50.0000 mg | ORAL_TABLET | Freq: Every day | ORAL | 0 refills | Status: DC

## 2024-09-19 MED ORDER — NICOTINE 21 MG/24HR TD PT24
21.0000 mg | MEDICATED_PATCH | Freq: Every day | TRANSDERMAL | 0 refills | Status: AC
Start: 2024-09-19 — End: 2024-10-17

## 2024-09-19 MED ORDER — DIVALPROEX SODIUM ER 500 MG PO TB24: 1000.0000 mg | ORAL_TABLET | Freq: Every day | ORAL | 0 refills | Status: DC

## 2024-09-19 MED ORDER — DIVALPROEX SODIUM ER 500 MG PO TB24
1000.0000 mg | ORAL_TABLET | Freq: Every day | ORAL | Status: DC
  Administered 2024-09-19: 1000 mg via ORAL
  Filled 2024-09-19: qty 2

## 2024-09-19 MED ORDER — SERTRALINE HCL 50 MG PO TABS
50.0000 mg | ORAL_TABLET | Freq: Every day | ORAL | Status: DC
  Administered 2024-09-20: 50 mg via ORAL
  Filled 2024-09-19: qty 1

## 2024-09-19 MED ORDER — TRAZODONE HCL 50 MG PO TABS: 100.0000 mg | ORAL_TABLET | Freq: Every evening | ORAL | Status: DC | PRN

## 2024-09-19 MED ORDER — GABAPENTIN 100 MG PO CAPS: 100.0000 mg | ORAL_CAPSULE | Freq: Three times a day (TID) | ORAL | 0 refills | Status: DC

## 2024-09-19 MED ORDER — NICOTINE 21 MG/24HR TD PT24
21.0000 mg | MEDICATED_PATCH | Freq: Every day | TRANSDERMAL | Status: DC
  Filled 2024-09-19: qty 1

## 2024-09-19 MED ORDER — GABAPENTIN 100 MG PO CAPS
100.0000 mg | ORAL_CAPSULE | Freq: Three times a day (TID) | ORAL | Status: DC
  Administered 2024-09-19 – 2024-09-20 (×2): 100 mg via ORAL
  Filled 2024-09-19 (×2): qty 1

## 2024-09-19 MED ORDER — ARIPIPRAZOLE 5 MG PO TABS
15.0000 mg | ORAL_TABLET | Freq: Every day | ORAL | Status: DC
  Administered 2024-09-20: 15 mg via ORAL
  Filled 2024-09-19: qty 1

## 2024-09-19 NOTE — [facility]HH Suicide Risk Assessment (Signed)
 [facility] Discharge Suicide Risk Assessment   Principal Problem: Psychoactive substance-induced psychosis (HCC) Discharge Diagnoses: Principal Problem:   Psychoactive substance-induced psychosis (HCC) Active Problems:   Bipolar affective disorder, current episode manic (HCC)   Severe stimulant use disorder (HCC)   Suicide Risk: The patient presented with acute risk factors including AMS/psychosis as well as recent substance use. However consistently denied SI on admission and subsequent days of hospitalization. Chronic risk factors include male gender, history of mental illness, history of inpatient hospitalizations, history of recurrent SI and suicide attempts (previously endorsed via IV drug use). These are mitigated by protective factors including denial of current depressed mood, resolution of psychotic symptoms, future orientation (wanting to get back to work), and euthymic affect. Although chronically elevated in comparison to the general population based on non-modifiable risk factors as noted above, current and short-term risk of suicide is considered low. Most appropriate and least restrictive environment is outpatient follow up as documented below. Recommend continuing current medications and calling 911, [phone] or presenting to the nearest ED if in crisis in the future.    Follow-up Information     [facility] Follow up on 09/26/2024.   Why: You have a hospital follow up appointment for therapy and medication management services on 09/26/24 at 8:30 am.  The appointment will be Virtual, telehealth. Contact information: [address]  [address] [place] [address] [phone]                 Leita LOISE Arts, MD 09/19/2024, 7:53 AM

## 2024-09-19 NOTE — Transportation (Signed)
 09/19/2024  Cory Floyd DOB: May 04, 1976 MRN: [id number]   RIDER WAIVER AND RELEASE OF LIABILITY  For the purposes of helping with transportation needs, [facility] partners with outside transportation providers (taxi companies, [place], catering manager.) to give [place] patients or other approved people the choice of on-demand rides Public Librarian) to our buildings for non-emergency visits.  By using [employer], I, the person signing this document, on behalf of myself and/or any legal minors (in my care using the [employer]), agree:  Science Writer given to me are supplied by independent, outside transportation providers who do not work for, or have any affiliation with, [employer]. [place] is not a transportation company. [facility] has no control over the quality or safety of the rides I get using [employer]. [place] has no control over whether any outside ride will happen on time or not. [facility] gives no guarantee on the reliability, quality, safety, or availability on any rides, or that no mistakes will happen. I know and accept that traveling by vehicle (car, truck, SVU, fleeta, bus, taxi, etc.) has risks of serious injuries such as disability, being paralyzed, and death. I know and agree the risk of using [employer] is mine alone, and not [employer]. [employer] are provided as is and as are available. The transportation providers are in charge for all inspections and care of the vehicles used to provide these rides. I agree not to take legal action against [place], its agents, employees, officers, directors, representatives, insurers, attorneys, assigns, successors, subsidiaries, and affiliates at any time for any reasons related directly or indirectly to using [employer]. I also agree not to take legal action against [place] or its affiliates for any injury, death, or damage to property caused by or related to using  [employer]. I have read this Waiver and Release of Liability, and I understand the terms used in it and their legal meaning. This Waiver is freely and voluntarily given with the understanding that my right (or any legal minors) to legal action against [place] relating to [employer] is knowingly given up to use these services.   I attest that I read the Ride Waiver and Release of Liability to Cory Floyd, gave Mr. Fudala the opportunity to ask questions and answered the questions asked (if any). I affirm that Cory Floyd then provided consent for assistance with transportation.

## 2024-09-19 NOTE — ED Provider Notes (Signed)
 [place] EMERGENCY DEPARTMENT AT [facility] Provider Note   CSN: [id number] Arrival date & time: 09/19/24  2115     Patient presents with: No chief complaint on file.   Cory Floyd is a 48 y.o. male.   HPI Patient with a past medical history of substance use disorder, bipolar, depression, diabetes, hypertension, schizophrenia.  Patient has been seen several times in the ED this year for paranoia.  Most recently was admitted on November 13th and discharged earlier today on Abilify , Zoloft , Depakote  and bedtime trazodone .  Patient is homeless and discharge instructions included for patient to go to [facility].  Per chart he refused social work interview and he was uninterested in substance abuse programming.  He reports he was discharged, went to [facility] and they told him to come back on Monday.  So he then went to a friend's house where he ate turkey and mashed potatoes, he started feeling like voices were telling him that they wanted him dead and that people were coming for him.  He drank a beer but only had 1 beer and denies any recreational drug use today.  Once he started hearing the voices he left his friends and came to the ED because he felt unsafe.  He says the voices are worse than they have been in the past.  He denies seeing things that are not there, denies suicidal ideation and denies homicidal ideation.  He says he did not do any recreational drug use and is willing to provide a urine sample after eating his dinner and taking his nighttime medicines.  He has all of his discharge medications with him.  He has not taken his nighttime medications.    Prior to Admission medications   Medication Sig Start Date End Date Taking? Authorizing Provider  ARIPiprazole  (ABILIFY ) 15 MG tablet Take 1 tablet (15 mg total) by mouth daily. 09/19/24 10/19/24  Towana Leita SAILOR, MD  divalproex  (DEPAKOTE  ER) 500 MG 24 hr tablet Take 2 tablets (1,000 mg total) by mouth at bedtime. 09/19/24  10/19/24  Towana Leita SAILOR, MD  gabapentin  (NEURONTIN ) 100 MG capsule Take 1 capsule (100 mg total) by mouth 3 (three) times daily. 09/19/24 10/19/24  Towana Leita SAILOR, MD  nicotine  (NICODERM CQ  - DOSED IN MG/24 HOURS) 21 mg/24hr patch Place 1 patch (21 mg total) onto the skin daily for 28 days. 09/19/24 10/17/24  Towana Leita SAILOR, MD  sertraline  (ZOLOFT ) 50 MG tablet Take 1 tablet (50 mg total) by mouth daily. 09/19/24 10/19/24  Towana Leita SAILOR, MD  traZODone  (DESYREL ) 100 MG tablet Take 1-2 tablets (100-200 mg total) by mouth at bedtime as needed for sleep. 09/19/24 10/19/24  Towana Leita SAILOR, MD    Allergies: Carrot [daucus carota], Penicillins, and Principen [ampicillin]    Review of Systems  Updated Vital Signs BP (!) 149/101 (BP Location: Right Arm)   Pulse (!) 112   Temp (!) 97.4 F (36.3 C)   Resp 19   SpO2 100%   Physical Exam Vitals reviewed.  Eyes:     Conjunctiva/sclera: Conjunctivae normal.  Pulmonary:     Effort: Pulmonary effort is normal.  Psychiatric:        Attention and Perception: Attention normal. He does not perceive visual hallucinations.        Mood and Affect: Mood is anxious. Affect is not angry.        Speech: Speech normal. Speech is not rapid and pressured, slurred or tangential.  Behavior: Behavior is not slowed, withdrawn or combative. Behavior is cooperative.        Thought Content: Thought content is paranoid. Thought content does not include homicidal or suicidal ideation. Thought content does not include homicidal or suicidal plan.        Cognition and Memory: Cognition and memory normal.     (all labs ordered are listed, but only abnormal results are displayed) Labs Reviewed  COMPREHENSIVE METABOLIC PANEL WITH GFR - Abnormal; Notable for the following components:      Result Value   Glucose, Bld 118 (*)    Total Protein 8.2 (*)    All other components within normal limits  CBC - Abnormal; Notable for the following components:   WBC 11.7  (*)    All other components within normal limits  ETHANOL  RAPID URINE DRUG SCREEN, HOSP PERFORMED    EKG: None  Radiology: No results found.   Procedures   Medications Ordered in the ED  ARIPiprazole  (ABILIFY ) tablet 15 mg (has no administration in time range)  divalproex  (DEPAKOTE  ER) 24 hr tablet 1,000 mg (has no administration in time range)  gabapentin  (NEURONTIN ) capsule 100 mg (has no administration in time range)  nicotine  (NICODERM CQ  - dosed in mg/24 hours) patch 21 mg (has no administration in time range)  sertraline  (ZOLOFT ) tablet 50 mg (has no administration in time range)  traZODone  (DESYREL ) tablet 100-200 mg (has no administration in time range)                                    Medical Decision Making Amount and/or Complexity of Data Reviewed Labs: ordered.  Risk OTC drugs. Prescription drug management.   This patient presents to the ED for concern of paranoia, this involves an extensive number of treatment options, and is a complaint that carries with it a high risk of complications and morbidity.  The differential diagnosis includes active psychosis, substance-induced psychosis less likely mania, depression, anxiety   Co morbidities / Chronic conditions that complicate the patient evaluation  Bipolar 1, depression, schizophrenia, polysubstance use   Additional history obtained:  Additional history obtained from EMR External records from outside source obtained and reviewed including last hospital admission and discharge summaries   Lab Tests:  I Ordered, and personally interpreted labs.  The pertinent results include: WBC 11.7, glucose 118, total protein 8.2   Imaging Studies ordered:  No imaging studies ordered   Cardiac Monitoring: / EKG:  Patient was not maintained on cardiac monitor   Problem List / ED Course / Critical interventions / Medication management  Worsening paranoia after recent discharge I ordered medication  including Abilify , Depakote , gabapentin , nicotine , sertraline , trazodone  Patient was recently discharged from [facility] earlier today and told to come back to the ED if his paranoia worsens.  Patient presents to the ED with worsening paranoia, he says that he fears for his safety and that he is hearing voices that people are trying to hurt him. His speech is linear and thought content appears normal but is fearful and paranoid. He is medically clear for psychiatric evaluation.  Consult to TTS placed and home medications were started. I have reviewed the patients home medicines and have made adjustments as needed   Consultations Obtained:  I requested consultation with the psychiatry, pending their evaluation and recommendations   Social Determinants of Health:  Homeless   Test / Admission - Considered:  Pending  psych evaluation in the emergency department.      Final diagnoses:  Paranoia [facility])    ED Discharge Orders     None          Charmayne Holmes, DO 09/19/24 2312    Tegeler, Lonni PARAS, MD 09/19/24 [phone]

## 2024-09-19 NOTE — Discharge Summary (Signed)
 Physician Discharge Summary Note  Patient:  Cory Floyd is an 48 y.o., male MRN:  [id number] DOB:  17-May-1976 Patient phone:  [phone] (home)  Patient address:   [address] [place] [address],  Total Time spent with patient: 35 minutes   Date of Admission:  09/05/2024 Date of Discharge: 09/19/24  Reason for Admission:  AMS/psychosis in the context of substance use and medication non-adherence   Principal Problem: Psychoactive substance-induced psychosis [facility]) Discharge Diagnoses: Principal Problem:   Psychoactive substance-induced psychosis (HCC) Active Problems:   Bipolar affective disorder, current episode manic (HCC)   Severe stimulant use disorder (HCC)  Identifying Information and past psychiatric history: The patient is a 48 y.o. male with a psychiatric history of underlying bipolar 1 disorder as well as a history of substance-induced psychosis and severe stimulant use disorder. He presents on this admission with manic and psychotic symptoms in the community (bizarre behavior at the bank) in the context of substance use - admitted under IVC  Previous Psychiatric Diagnoses: Bipolar I disorder, substance-induced psychosis, cocaine abuse with cocaine-induced mood disorder Psychiatric medications prior to admission: trileptal  300 BID, remeron  15, abilify  20mg  at bedtime (patient reported not taking any of these - had most recently been on depakote  + abilify  LAI but had been off all meds for several months) Psychiatric Hospitalization hx: [facility] 12/2022 (SI, depression), 04/2021 (SI), 03/2021 (SI with plan to shoot self with gun), 01/2021 (aggressive behaviors and SI/HI threats) History of suicide: yes, via IV drug use History of homicide or aggression (obtained in HPI): yes, 01/2021 hospitalization   Substance Abuse Hx: (Frequency, quantity, last use, impact) UDS+cocaine, amphetamines on this admission Patient has a history of IVDU.  Per chart review, patient has past  history of stimulant use disorder (meth, cocaine) and tobacco use   Hospital Course: Patient initially arrived to [facility] on 09/04/24 for hallucinations, paranoia, and bizarre behavior in the community (at the bank was making bizarre statements and acting erratically). Medically cleared in the ED and admitted to [facility] for further evaluation and treatment  On arrival (11/15) noted express ongoing paranoia and irritability, overall non-cooperative with interview (attempted interview numerous times and patient not answering questions). Home medications per chart review were initially restarted: trileptal  300 BID, remeron  15, abilify  20mg  at bedtime. However, the following day patient did respond to enough questions to voice no current adherence to these medications. Trileptal  and mirtazapine  were discontinued and abilify  was decreased to 10 mg. Throughout admission PO abilify  was increased to a total of 15 mg daily. Later in hospital course he did also note prior benefit with depakote , and as he was refusing other medications this was restarted for him at 1000 mg at bedtime. Patient did not overtly present as manic in the following days, however remained irritable, isolative to room and non-cooperative with care. Out of concern for depression contributing to this he was placed on zoloft , increased to 50 mg before dischare. Finally, to address pain, anxiety and cravings gabapentin  100 mg TID was added to regimen.  As of 11/25, the patient was somewhat withdrawn, continued to voiced fatigue and intermittent AH but overall linear, organized and goal-directed. Was less irritable and more engaged in interview. Yesterday, he was largely pleasant and cooperative and deemed likely at or nearing baseline. Although he remains isolative to room, this appeared to be volitional rather than due to mood or psychotic symptoms. We discussed possibility of substance treatment but patient was not interested. Plan was for discharge to  [facility]  in the morning.   Interview today: Today the patient reports he is good. Denies current depressed mood, SI, HI or AVH. Denies concerns except to make sure that he has transportation to the [facility] today. Voiced understanding that his medications would be provided via paper script and intent to continue on them. Denied side effects. Reported good sleep and appetite. Overall wanting to leave the hospital and get back to work, although he continues to worry about long-term housing. No additional questions or concerns today.    Behavior on unit/overall: On the first 4-5 days of admission the patient was irritable, refused to engage with staff, and largely non-compliant with medications. After switching medicines to depakote /abilify  combination he was reluctantly agreeable to begin taking them. However, throughout the entirety of admission was generally non-cooperative with care and did not participate in therapeutic milieu. He had to be forced out of his bed to take medications every day and returned to his room to lay in bed in between. He refused SW interview, turning them away numerous times and when he did speak with staff was largely focused on pain medications, nasal spray or other physical complaints. After obtaining PRNs for physical concerns would again isolate in room. Stay was likely lengthened in part due to refusal to engage with staff and peers, which made it difficult to determine if he was back at baseline. However, he did not present with overt agitation and did not require PRN medications for behaviors throughout most of admission. Over the last at least 3 days upon interview with this dino he has been linear and organized in thought process, overall less irritable and better engaged in interview and not demonstrating any overt signs of depression, mania or psychosis. Denying SI, HI and AVH (although intermittently voicing AH to other staff). As patient was overall uninterested in substance  programming and deemed at baseline he was ultimately discharge to [facility] today with paper scripts in hand and follow up as documented below. Recommended presenting to the nearest ED, calling [phone] or [phone] should any symptoms recur. Voiced understanding and intent.    Past Medical History:  Past Medical History:  Diagnosis Date   Alcoholic (HCC)    clean for 3 years   Anginal pain    Arthritis    Bipolar 1 disorder (HCC)    COVID-19    Depressed    Diabetes mellitus without complication (HCC)    Dyspnea    Fatty liver    Hypertension    Schizophrenia (HCC)     Past Surgical History:  Procedure Laterality Date   COLONOSCOPY  02/05/2014   diverticulosis, hyperplastic polyp x 2   fracture arm Right    INCISION AND DRAINAGE ABSCESS Bilateral 12/29/2022   Procedure: INCISION AND DRAINAGE ABSCESS BILATERAL ARMS;  Surgeon: Kendal Franky SQUIBB, MD;  Location: [facility] OR;  Service: Orthopedics;  Laterality: Bilateral;   KNEE SURGERY Right 2013   NASAL SEPTUM SURGERY     NASAL TURBINATE REDUCTION Bilateral 12/12/2016   Procedure: TURBINATE REDUCTION/SUBMUCOSAL RESECTION;  Surgeon: Chinita Hasten, MD;  Location: [facility] ORS;  Service: ENT;  Laterality: Bilateral;   Family History:  Family History  Problem Relation Age of Onset   Hepatitis C Mother    Cancer Mother    Cancer Maternal Aunt    Hypertension Maternal Grandmother     Social History:  Social History   Substance and Sexual Activity  Alcohol Use Yes   Comment: 1/2-5th pint of liquor     Social History  Substance and Sexual Activity  Drug Use Yes   Types: Cocaine, Marijuana   Comment: heroin    Social History   Socioeconomic History   Marital status: Divorced    Spouse name: Not on file   Number of children: Not on file   Years of education: Not on file   Highest education level: Not on file  Occupational History   Not on file  Tobacco Use   Smoking status: Every Day    Current packs/day: 1.00    Average packs/day: 1  pack/day for 20.0 years (20.0 ttl pk-yrs)    Types: Cigarettes   Smokeless tobacco: Never  Vaping Use   Vaping status: Never Used  Substance and Sexual Activity   Alcohol use: Yes    Comment: 1/2-5th pint of liquor   Drug use: Yes    Types: Cocaine, Marijuana    Comment: heroin   Sexual activity: Yes    Birth control/protection: None  Other Topics Concern   Not on file  Social History Narrative   Not on file   Social Drivers of Health   Financial Resource Strain: Not on file  Food Insecurity: Food Insecurity Present (09/05/2024)   Hunger Vital Sign    Worried About Programme Researcher, Broadcasting/film/video in the Last Year: Often true    Ran Out of Food in the Last Year: Often true  Transportation Needs: Unmet Transportation Needs (09/05/2024)   PRAPARE - Administrator, Civil Service (Medical): Yes    Lack of Transportation (Non-Medical): Yes  Physical Activity: Not on file  Stress: Not on file  Social Connections: Not on file    Physical findings:  Mental Status exam: Appearance: white male, fairly groomed, seen sitting up in bed Eye contact: good - improved Attitude towards examiner largely cooperative, pleasant  Psychomotor: no agitation or retardation  Speech: largely normal in rate and prosody, still quiet and reduced amount Language: no delays  Mood: good Affect: congruent, neutral-euthymic; appropriate   Thought content: denies SI and HI, no overt delusions expressed  Thought Process: linear and organized, goal-directed  Perception: denying AVH during interview, not overtly RTIS  Insight: fair to good - improved insight into mental health concerns and need for treatment Judgement: fair - improving.    Orientation: x3 Attention/Concentration: grossly intact on conversation Memory/Cognition: limited ability to assess at this time   Fund of Knowledge: Average    Musculoskeletal: Strength & Muscle Tone: within normal limits Gait & Station: normal Patient leans:  N/A  Physical Exam Constitutional:      Appearance: Normal appearance.  HENT:     Head: Normocephalic and atraumatic.  Pulmonary:     Effort: Pulmonary effort is normal.  Abdominal:     General: There is no distension.  Musculoskeletal:        General: Normal range of motion.     Cervical back: Normal range of motion.  Neurological:     General: No focal deficit present.     Mental Status: He is alert.    ROS Blood pressure 115/77, pulse 68, temperature 97.7 F (36.5 C), temperature source Oral, resp. rate 14, height 5' 10 (1.778 m), weight 90.7 kg, SpO2 99%. Body mass index is 28.7 kg/m.   Social History   Tobacco Use  Smoking Status Every Day   Current packs/day: 1.00   Average packs/day: 1 pack/day for 20.0 years (20.0 ttl pk-yrs)   Types: Cigarettes  Smokeless Tobacco Never   Tobacco Cessation:  A prescription  for an FDA-approved tobacco cessation medication provided at discharge   Blood Alcohol level:  Lab Results  Component Value Date   [facility] <15 09/04/2024   ETH 26 (H) 08/21/2024    Metabolic Disorder Labs:  Lab Results  Component Value Date   HGBA1C 5.5 09/15/2024   MPG 111 09/15/2024   MPG 120 12/31/2022   No results found for: PROLACTIN Lab Results  Component Value Date   CHOL 177 09/15/2024   TRIG 83 09/15/2024   HDL 51 09/15/2024   CHOLHDL 3.5 09/15/2024   VLDL 17 09/15/2024   LDLCALC 110 (H) 09/15/2024   LDLCALC 91 12/31/2022    See Psychiatric Specialty Exam and Suicide Risk Assessment completed by Attending Physician prior to discharge.  Discharge destination:  [facility]  Is patient on multiple antipsychotic therapies at discharge:  No     Allergies as of 09/19/2024       Reactions   Carrot [daucus Carota] Anaphylaxis, Rash   Penicillins Anaphylaxis, Hives   Principen [ampicillin] Anaphylaxis, Hives        Medication List     STOP taking these medications    acetaminophen  500 MG tablet Commonly known as: TYLENOL     Excedrin Extra Strength 250-250-65 MG tablet Generic drug: aspirin-acetaminophen -caffeine   ibuprofen  200 MG tablet Commonly known as: ADVIL    lidocaine  5 % ointment Commonly known as: XYLOCAINE    mirtazapine  15 MG tablet Commonly known as: REMERON    naproxen  sodium 220 MG tablet Commonly known as: ALEVE    Oxcarbazepine  300 MG tablet Commonly known as: TRILEPTAL        TAKE these medications      Indication  ARIPiprazole  15 MG tablet Commonly known as: ABILIFY  Take 1 tablet (15 mg total) by mouth daily. What changed:  medication strength how much to take when to take this  Indication: Manic Phase of Manic-Depression   divalproex  500 MG 24 hr tablet Commonly known as: DEPAKOTE  ER Take 2 tablets (1,000 mg total) by mouth at bedtime.  Indication: Manic Phase of Manic-Depression   gabapentin  100 MG capsule Commonly known as: NEURONTIN  Take 1 capsule (100 mg total) by mouth 3 (three) times daily.  Indication: Neuropathic Pain   nicotine  21 mg/24hr patch Commonly known as: NICODERM CQ  - dosed in mg/24 hours Place 1 patch (21 mg total) onto the skin daily for 28 days.  Indication: Nicotine  Addiction   sertraline  50 MG tablet Commonly known as: ZOLOFT  Take 1 tablet (50 mg total) by mouth daily.  Indication: Major Depressive Disorder   traZODone  100 MG tablet Commonly known as: DESYREL  Take 1-2 tablets (100-200 mg total) by mouth at bedtime as needed for sleep. What changed: additional instructions  Indication: Trouble Sleeping        Follow-up Information     [facility] Follow up on 09/26/2024.   Why: You have a hospital follow up appointment for therapy and medication management services on 09/26/24 at 8:30 am.  The appointment will be Virtual, telehealth. Contact information: [address]  [address] [place] [address] [phone]                  Signed: Leita LOISE Arts, MD 09/19/2024, 9:11 AM

## 2024-09-19 NOTE — Group Note (Signed)
 Date:  09/19/2024 Time:  9:12 AM  Group Topic/Focus:  Goals Group:   The focus of this group is to help patients establish daily goals to achieve during treatment and discuss how the patient can incorporate goal setting into their daily lives to aide in recovery.    Participation Level:  Did Not Attend    Cory Floyd A Cory Floyd 09/19/2024, 9:12 AM

## 2024-09-19 NOTE — BH Assessment (Signed)
 Clinician messaged [name], RN and [name], NT: Cory Floyd. It's Cory Floyd with TTS. Is the pt able to engage in the assessment, if so the pt will need to be placed in a private room. Is the pt under IVC? Also is the pt medically cleared?   Clinician waiting response.    [name], MS, [facility], [facility] Triage Specialist [phone]

## 2024-09-19 NOTE — Care Management Important Message (Signed)
 Patient informed of right to appeal discharge, provided phone number to [facility]. Patient expressed no interest in appealing discharge.

## 2024-09-19 NOTE — ED Notes (Signed)
 Pt provided with specimen cup, pt states he cannot urinate at this time.

## 2024-09-19 NOTE — Progress Notes (Signed)
 Patient discharged off unit at 1045. Patient belongings reviewed and acknowledged by patient. AVS and Transition Record reviewed and acknowledged by patient. Safety plan completed by patient, reviewed by nurse with patient and copy provided. Any medications and or prescriptions necessary for discharge addressed and provided to patient. Patient denies SI, plan or intent. Denies HI. Denies AVH. No observed or reported side effects to medication. No observed or reported agitation, aggression, or other acute emotional distress. No reported or observed physical abnormalities or concerns. Patient transportation from facility verified and observed.

## 2024-09-19 NOTE — Progress Notes (Signed)
  [facility] Adult Case Management Discharge Plan :  Will you be returning to the same living situation after discharge:  No. Pt will discharge to the [facility]  At discharge, do you have transportation home?: Yes,  CSW arranged [employer] taxi to [facility] for 1030AM Do you have the ability to pay for your medications: Yes,  pt has active health insurance coverage. Samples requested from pharm at discharge due to pt d/c to shelter  Release of information consent forms completed and in the chart;  Patient's signature needed at discharge.  Patient to Follow up at:  Follow-up Information     [facility] Follow up on 09/26/2024.   Why: You have a hospital follow up appointment for therapy and medication management services on 09/26/24 at 8:30 am.  The appointment will be Virtual, telehealth. Contact information: [address]  [address] [place] [address] [phone]                 Next level of care provider has access to [facility] Link:no  Safety Planning and Suicide Prevention discussed: Yes,  completed with patient prior to discharge, SPE brochure given to patient      Has patient been referred to the Quitline?: Patient refused referral for treatment  Patient has been referred for addiction treatment: Patient refused referral for treatment.  Jenkins LULLA Primer, LCSWA 09/19/2024, 8:39 AM

## 2024-09-19 NOTE — BHH Group Notes (Addendum)
 Adult Psychoeducational Group Note  Date:  09/18/2024 Time:  2000  Group Topic/Focus:  Wrap-Up Group:   The focus of this group is to help patients review their daily goal of treatment and discuss progress on daily workbooks.  Participation Level:  Did Not Attend  Participation Quality:    Affect:   Cognitive:    Insight:   Engagement in Group:    Modes of Intervention:    Additional Comments:    Cory Floyd
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
## 2024-09-19 NOTE — Progress Notes (Signed)
 Shift Note  (Sleep Hours) - 12.75  (Any PRNs that were needed, meds refused, or side effects to meds)-   Med Refusal: Depakote  (Pt was offered medications at 9, 9:30, and 10 pm. Each time he stated it was too early for his medications and he does not take his meds early.)  (Any disturbances and when (visitation, over night)- None  (Concerns raised by the patient)- Denies  (SI/HI/AVH)- Denies

## 2024-09-19 NOTE — ED Triage Notes (Signed)
 Patient states that he has been feeling paranoid for the past couple hours. He thinks people are following him and he does not feel safe.

## 2024-09-20 ENCOUNTER — Encounter (HOSPITAL_COMMUNITY): Payer: Self-pay | Admitting: Psychiatry

## 2024-09-20 LAB — RAPID URINE DRUG SCREEN, HOSP PERFORMED
Amphetamines: POSITIVE — AB
Barbiturates: NOT DETECTED
Benzodiazepines: NOT DETECTED
Cocaine: NOT DETECTED
Opiates: NOT DETECTED
Tetrahydrocannabinol: NOT DETECTED

## 2024-09-20 NOTE — Consult Note (Addendum)
 [facility] Consult Follow-up  Patient Name: .Cory Floyd  MRN: [id number]  DOB: [birth year]  Consult Order details:  Orders (From admission, onward)     Start     Ordered   09/19/24 2232  CONSULT TO CALL ACT TEAM       Ordering Provider: Ginger Lonni PARAS, MD  Provider:  (Not yet assigned)  Question:  Reason for Consult?  Answer:  Discharged from psychiatry today, went to [facility] and then to a friend house where he had worsened paranoia and audiovisual hallucinations.  He was told to come back to the emergency department.  Concern for worsening hallucinations and paranoia.   09/19/24 2232             Mode of Visit: In person    Psychiatry Consult Evaluation  Service Date: September 20, 2024 LOS:  LOS: 0 days  Chief Complaint: Hallucinations and paranoia   Primary Psychiatric Diagnoses    Malingering 2.    Methamphetamine use disorder, severe (HCC)  Assessment   Cory Floyd is a 48 y.o. Caucasian male with a past psychiatric history of bipolar 1 disorder, substance-induced psychosis, substance-induced mood disorder, methamphetamine use disorder, cocaine use disorder, alcohol use disorder, MDD, GAD, and malingering, with pertinent medical comorbidities/history that include obesity, diabetes, and hypertension, who presented this encounter by way of self for concerns for paranoia and hallucinations, who upon EDP evaluation, consulted psychiatry for specialty evaluation and recommendations. Patient is currently medically clear, per EDP team, as well as voluntary.  Upon follow-up reevaluation, patient presents with symptomology that is most consistent with malingering and methamphetamine use disorder severe. Evidence of this is appreciable from investigation conducted, where patient presents severely incongruent in his presentation with this provider and staff this encounter, and with objectively incongruent testimony on numerous endorsements given to various previous  providers (see below), as well as presents with laboratory studies that reflect that the patient has recently used methamphetamines, of which in collaboration with chronic illness coarse already of severe methamphetamine use disorder, reflects this chronic illness course is to date and currently an issue.  Given investigation conducted, there is no evidence that the patient is an imminent risk to himself or others, or decompensated into psychosis that would warrant the recommendation for readmission to inpatient mental health hospitalization services, but rather the patient is more appropriate to continue with discharge plan that was put into place upon his discharge yesterday.  Patient's prognosis is guarded, given desire to inappropriately utilize emergency services, in the context of seeking temporary shelter, in the further context of chronic homelessness, versus the recommended more appropriate measures.  Spoke with Dr. Sable Alken who agrees with recommendation for psychiatric clearance, as well as additional recommendations listed below.  Diagnoses:  Active Hospital problems: Principal Problem:   Malingering Active Problems:   Methamphetamine use disorder, severe (HCC)    Plan   # Malingering # Methamphetamine use disorder severe  ## Psychiatric Recommendations:   - Recommend continue current outpatient psychiatric medication regimen - Recommend close outpatient follow-up with [facility], per discharge plan 09/19/2024 - Recommend safety return precautions - Recommend patient consider abstinence from illicit substance use - Recommend patient follow-up with shelter resources given upon discharge  ## Medical Decision Making Capacity: Not specifically addressed in this encounter  ## Further Work-up: None at this time  ## Disposition:-- There are no psychiatric contraindications to discharge at this time  ## Behavioral / Environmental: -Recommend routine agitation/safety  precautions until discharge; recommend safety return  precautions upon discharge    ## Safety and Observation Level:  - Based on my clinical evaluation, I estimate the patient to be at low risk of self harm in the current setting and upon recommendation for discharge. - At this time, we recommend  routine. This decision is based on my review of the chart including patient's history and current presentation, interview of the patient, mental status examination, and consideration of suicide risk including evaluating suicidal ideation, plan, intent, suicidal or self-harm behaviors, risk factors, and protective factors. This judgment is based on our ability to directly address suicide risk, implement suicide prevention strategies, and develop a safety plan while the patient is in the clinical setting. Please contact our team if there is a concern that risk level has changed.  CSSR Risk Category:C-SSRS RISK CATEGORY: No Risk  Suicide Risk Assessment: Patient has following modifiable risk factors for suicide: recent psychiatric hospitalization, which we are addressing by recommendations. Patient has following non-modifiable or demographic risk factors for suicide: male gender, separation or divorce, history of suicide attempt, and psychiatric hospitalization Patient has the following protective factors against suicide: Access to outpatient mental health care, Supportive friends, Frustration tolerance, and no history of NSSIB  Thank you for this consult request. Recommendations have been communicated to the primary team.  We will sign off at this time.   Jerel JINNY Gravely, NP     History of Present Illness   Cory Floyd is a 48 y.o. Caucasian male with a past psychiatric history of bipolar 1 disorder, substance-induced psychosis, substance-induced mood disorder, methamphetamine use disorder, cocaine use disorder, alcohol use disorder, MDD, GAD, and malingering, with pertinent medical  comorbidities/history that include obesity, diabetes, and hypertension, who presented this encounter by way of self for concerns for paranoia and hallucinations, who upon EDP evaluation, consulted psychiatry for specialty evaluation and recommendations. Patient is currently medically clear, per EDP team, as well as voluntary.  09/20/2024  Patient seen today at the [facility] Emergency Department for face-to-face psychiatric follow-up.  Upon evaluation, like previously reported to EDP Dr. Charmayne, patient reports that he was discharged yesterday afternoon to the local [facility] shelter from inpatient hospitalization at [facility], when he states that he was instructed by staff at the shelter to come back Monday because they were closed, so he states that he proceeded to go to his friend's house to have Thanksgiving, where he states that he ate turkey and mashed potatoes, when he states that he started to experience a recrudescence of auditory hallucinations telling him that people wanted him dead and that they were coming after him, so he states that he out of concern for his safety, and feeling overwhelmed with paranoia, he decided to have his friend give him a ride to the hospital to get additional psychiatric treatment. Additionally, and like previously reported to EDP Dr. Charmayne, he reports that he only drank 1 beer, but incongruent to EDP report, endorses that he also smoked a, blunt of cannabis with his friend, and, that is it; UDS notably negative for cannabinoids but positive for methamphetamines however, and BAL unremarkable.   Patient then prompted to clarify on the incongruencies of his testimony given from previous reports, specifically when the endorsed development of paranoia and hallucinations started (I.e., EDP report versus [facility] reports versus triage reports), to which patient then at this time endorses that, actually, that is right, and then proceeds to state that he was, wrongfully discharged too  early from [facility], stating that he told staff  and the provider team at [facility] numerous times on the day of discharge that he was still seeing things, hearing voices, and, feeling paranoid, and that upon discharge things just progressively got worse, so he states again he presented back to the hospital, and then notably states, but I was never suicidal or homicidal, that has always been consistent, because I do not want to hurt myself or anyone else, I just feel like I need to maybe stay and have more adjustments done (notably incongruent to Dr. Towana report on day of discharge 09/19/2024 below).  Patient then prompted to clarify in the incongruencies of his testimony given from previous reports as it relates to substance use, as well as laboratory studies that reflect no cannabinoids in his system but rather methamphetamines, to which patient endorses that (I) do not believe I told them that I did not smoke a blunt with my buddy, but maybe I did, and endorses that he does not recall using any meth.  Patient endorses during examination that he remains without suicidal and or homicidal ideations, but he continues to experience mild residual paranoia, and endorses that he continues to hear softer voices telling him that people are after him, though objectively, does not appear to be responding to internal and or external stimuli, and/or paranoid, and presents with an incongruent affect and interpersonal style, with variable to brief eye contact.   Patient endorses that since discharge he has remained medication compliant with his prescribed psychiatric medications.  Patient endorses that he does not have any problem with plan that was put in place upon his discharge yesterday to follow-up with outpatient resources, but endorses that again, I think I may be just need more adjustments in inpatient would help.  Discussed with patient that given examination conducted, recommendation would be for continuing  with recommendations given to him by Dr. Towana to follow-up closely with outpatient resources and to continue his current psychiatric medications, given there was no justification for the patient being appropriate for higher level of care, to which patient endorsed frustratedly that he did not agree, and then stated to end our engagement, I guess if you do not believe me I will just have to go somewhere else.  Nursing/chart review: Staff reports that the patient has been incongruent in his presentation.  Collateral reports below  Towana, MD- 09/19/2024  Today the patient reports he is good. Denies current depressed mood, SI, HI or AVH. Denies concerns except to make sure that he has transportation to the [facility] today. Voiced understanding that his medications would be provided via paper script and intent to continue on them. Denied side effects. Reported good sleep and appetite. Overall wanting to leave the hospital and get back to work, although he continues to worry about long-term housing. No additional questions or concerns today.   Dr. Charmayne- 09/19/2024  Patient with a past medical history of substance use disorder, bipolar, depression, diabetes, hypertension, schizophrenia.  Patient has been seen several times in the ED this year for paranoia.  Most recently was admitted on November 13th and discharged earlier today on Abilify , Zoloft , Depakote  and bedtime trazodone .  Patient is homeless and discharge instructions included for patient to go to [facility]  Per chart he refused social work interview and he was uninterested in substance abuse programming.   He reports he was discharged, went to [facility] and they told him to come back on Monday.  So he then went to a friend's house where he ate turkey and  mashed potatoes, he started feeling like voices were telling him that they wanted him dead and that people were coming for him.  He drank a beer but only had 1 beer and denies any recreational drug use  today.  Once he started hearing the voices he left his friends and came to the ED because he felt unsafe.  He says the voices are worse than they have been in the past.  He denies seeing things that are not there, denies suicidal ideation and denies homicidal ideation.  He says he did not do any recreational drug use and is willing to provide a urine sample after eating his dinner and taking his nighttime medicines.   He has all of his discharge medications with him.  He has not taken his nighttime medications.  Cory Floyd- CCA exam  Cory Floyd is a 48 year old male who presents voluntary and unaccompanied to [facility] Emergency Department. Clinician asked the pt, what brought you to the hospital? Pt reports, he was discharged from [facility] today (after a 14 day admission) he was told to go the [facility] for help but they told him to come back on Monday (09/22/2024). Pt reports, he went over a friends house, to eat but he started hearing voices that wanted him head and people were coming for him, so he came to the ED. Pt reports, he told staff he was still seeing things and scared someone  is after him but he was discharged. Pt reports, he's hearing voices and seeing people that he wouldn't expect to see. Pt reports, while in the hospital he seen his ex wife and her new husband walking in the hall. Pt denies, SI, HI, self-injurious and access to weapons.   Review of Systems  Constitutional:  Positive for malaise/fatigue.  Psychiatric/Behavioral:  Positive for depression, hallucinations (Auditory) and substance abuse (THC). Negative for suicidal ideas. The patient is nervous/anxious and has insomnia.   All other systems reviewed and are negative.    Psychiatric and Social History  Psychiatric History:  Information collected from chart review/patient  Prev Dx/Sx: As above Current Psych Provider: None Home Meds (current): Abilify , Depakote , gabapentin , nicotine , Zoloft ,  trazodone  Previous Med Trials: See chart Therapy: None  Prior Psych Hospitalization: Multiple, just discharged yesterday 09/19/2024 Prior Self Harm: Yes Prior Violence: Yes  Family Psych History: None endorsed Family Hx suicide: None endorsed  Social History:  Developmental Hx: None endorsed Educational Hx: None endorsed Occupational Hx: SSDI Legal Hx: History of incarceration and drug charges Living Situation: Homeless chronically Spiritual Hx: None endorsed Access to weapons/lethal means: None endorsed  Substance History Alcohol: History of abuse Tobacco: Daily Illicit drugs: Polysubstance abuse Prescription drug abuse: Polysubstance abuse Rehab hx: Multiple  Exam Findings  Physical Exam: As below Vital Signs:  Temp:  [97.4 F (36.3 C)-98.3 F (36.8 C)] 98.3 F (36.8 C) (11/29 0558) Pulse Rate:  [107-112] 107 (11/29 0558) Resp:  [18-19] 18 (11/29 0558) BP: (112-149)/(80-101) 112/80 (11/29 0558) SpO2:  [98 %-100 %] 98 % (11/29 0558) Blood pressure 112/80, pulse (!) 107, temperature 98.3 F (36.8 C), temperature source Oral, resp. rate 18, SpO2 98%. There is no height or weight on file to calculate BMI.  Physical Exam Vitals and nursing note reviewed.  Constitutional:      General: He is not in acute distress.    Appearance: Normal appearance. He is obese. He is not ill-appearing, toxic-appearing or diaphoretic.     Comments: Incongruent interpersonal style  Pulmonary:  Effort: Pulmonary effort is normal.  Skin:    General: Skin is warm and dry.  Neurological:     Mental Status: He is alert and oriented to person, place, and time.     Motor: No weakness, tremor or seizure activity.  Psychiatric:        Attention and Perception: Attention normal. He perceives auditory hallucinations.        Behavior: Behavior is actively hallucinating. Behavior is cooperative.        Thought Content: Thought content is paranoid. Thought content is not delusional. Thought  content does not include homicidal or suicidal ideation.     Comments: Perception: Reports he hears voices telling him that people are after him Mood: nervous Affect: Incongruent Speech: Increased amount but not pressured Behavior: Incongruent Cognition/memory: Grossly intact Judgment: Inappropriate Thought content: Logical but incongruent     Mental Status Exam: General Appearance: Well-groomed obese Caucasian male in scrubs with incongruent interpersonal style  Orientation:  Full (Time, Place, and Person)  Memory:  Grossly intact  Concentration:  Concentration: Fair and Attention Span: Fair  Recall:  Grossly intact  Attention  Fair  Eye Contact:  Variable to brief  Speech:  Clear and Coherent and increased amount but not pressured  Language:  Fair  Volume:  Normal  Mood: Nervous  Affect:  Non-Congruent  Thought Process:  Coherent and Goal Directed; linear to circumstantial  Thought Content:  Logical  Suicidal Thoughts:  No  Homicidal Thoughts:  No  Judgement:  Intact, but inappropriate  Insight:  Lacking, Present, and Shallow  Psychomotor Activity:  Normal  Akathisia:  No  Fund of Knowledge:  Grossly intact      Assets:  Architect Leisure Time Physical Health Resilience Social Support Talents/Skills Vocational/Educational  Cognition: Grossly intact  ADL's:  Intact  AIMS (if indicated):   0     Other History   These have been pulled in through the EMR, reviewed, and updated if appropriate.  Family History:  The patient's family history includes Cancer in his maternal aunt and mother; Hepatitis C in his mother; Hypertension in his maternal grandmother.  Medical History: Past Medical History:  Diagnosis Date   Alcoholic (HCC)    clean for 3 years   Anginal pain    Arthritis    Bipolar 1 disorder (HCC)    COVID-19    Depressed    Diabetes mellitus without complication (HCC)    Dyspnea    Fatty liver     Hypertension    Schizophrenia (HCC)     Surgical History: Past Surgical History:  Procedure Laterality Date   COLONOSCOPY  02/05/2014   diverticulosis, hyperplastic polyp x 2   fracture arm Right    INCISION AND DRAINAGE ABSCESS Bilateral 12/29/2022   Procedure: INCISION AND DRAINAGE ABSCESS BILATERAL ARMS;  Surgeon: Kendal Franky SQUIBB, MD;  Location: [facility] OR;  Service: Orthopedics;  Laterality: Bilateral;   KNEE SURGERY Right 2013   NASAL SEPTUM SURGERY     NASAL TURBINATE REDUCTION Bilateral 12/12/2016   Procedure: TURBINATE REDUCTION/SUBMUCOSAL RESECTION;  Surgeon: Chinita Hasten, MD;  Location: [facility] ORS;  Service: ENT;  Laterality: Bilateral;     Medications:   Current Facility-Administered Medications:    ARIPiprazole  (ABILIFY ) tablet 15 mg, 15 mg, Oral, Daily, Tegeler, Lonni PARAS, MD   divalproex  (DEPAKOTE  ER) 24 hr tablet 1,000 mg, 1,000 mg, Oral, QHS, Tegeler, Lonni PARAS, MD, 1,000 mg at 09/19/24 2316   gabapentin  (NEURONTIN ) capsule 100 mg, 100 mg, Oral,  TID, Tegeler, Lonni PARAS, MD, 100 mg at 09/19/24 2316   nicotine  (NICODERM CQ  - dosed in mg/24 hours) patch 21 mg, 21 mg, Transdermal, Daily, Tegeler, Lonni PARAS, MD   sertraline  (ZOLOFT ) tablet 50 mg, 50 mg, Oral, Daily, Tegeler, Lonni PARAS, MD   traZODone  (DESYREL ) tablet 100-200 mg, 100-200 mg, Oral, QHS PRN, Tegeler, Lonni PARAS, MD  Current Outpatient Medications:    ARIPiprazole  (ABILIFY ) 15 MG tablet, Take 1 tablet (15 mg total) by mouth daily., Disp: 30 tablet, Rfl: 0   divalproex  (DEPAKOTE  ER) 500 MG 24 hr tablet, Take 2 tablets (1,000 mg total) by mouth at bedtime., Disp: 60 tablet, Rfl: 0   gabapentin  (NEURONTIN ) 100 MG capsule, Take 1 capsule (100 mg total) by mouth 3 (three) times daily., Disp: 90 capsule, Rfl: 0   sertraline  (ZOLOFT ) 50 MG tablet, Take 1 tablet (50 mg total) by mouth daily., Disp: 30 tablet, Rfl: 0   traZODone  (DESYREL ) 100 MG tablet, Take 1-2 tablets (100-200 mg total) by mouth at  bedtime as needed for sleep., Disp: 30 tablet, Rfl: 0   nicotine  (NICODERM CQ  - DOSED IN MG/24 HOURS) 21 mg/24hr patch, Place 1 patch (21 mg total) onto the skin daily for 28 days. (Patient not taking: Reported on 09/20/2024), Disp: 28 patch, Rfl: 0  Allergies: Allergies  Allergen Reactions   Carrot [Daucus Carota] Anaphylaxis and Rash   Penicillins Anaphylaxis and Hives   Principen [Ampicillin] Anaphylaxis and Hives    Jerel PARAS Gravely, NP

## 2024-09-20 NOTE — ED Notes (Signed)
 Patient is up watching tv

## 2024-09-20 NOTE — ED Notes (Addendum)
$  16.58 counted with security and locked up by security. Pt and pt belongings wanded by security.

## 2024-09-20 NOTE — Discharge Instructions (Signed)
 Follow up with [facility] on 09/26/2024; You have a hospital follow up appointment for therapy and medication management services on 09/26/24 at 8:30 am.  The appointment will be Virtual, telehealth.

## 2024-09-20 NOTE — ED Provider Notes (Signed)
 Emergency Medicine Observation Re-evaluation Note  Patient seen by behavioral health and is overall improved and cleared for discharge.  History of substance use disorder and at this time he is found to be malingering.  He has follow-up scheduled next week and will plan to go to that.  Per TTS recommendations, he will be discharged.  TTS recommended he continue his current outpatient regimen, which she will plan today of and he was given shelter resources.     Gennaro Duwaine CROME, DO 09/20/24 1043

## 2024-10-11 ENCOUNTER — Emergency Department (HOSPITAL_COMMUNITY)
Admission: EM | Admit: 2024-10-11 | Discharge: 2024-10-11 | Disposition: A | Discharge status: Home / Self Care | Attending: Emergency Medicine | Admitting: Emergency Medicine

## 2024-10-11 ENCOUNTER — Emergency Department (HOSPITAL_COMMUNITY)

## 2024-10-11 DIAGNOSIS — D649 Anemia, unspecified: Secondary | ICD-10-CM | POA: Diagnosis not present

## 2024-10-11 DIAGNOSIS — F419 Anxiety disorder, unspecified: Secondary | ICD-10-CM

## 2024-10-11 DIAGNOSIS — R4182 Altered mental status, unspecified: Secondary | ICD-10-CM | POA: Diagnosis present

## 2024-10-11 DIAGNOSIS — F22 Delusional disorders: Secondary | ICD-10-CM | POA: Insufficient documentation

## 2024-10-11 MED ADMIN — Aripiprazole Tab 5 MG: 15 mg | ORAL | NDC 50268008811

## 2024-10-11 MED ADMIN — Gabapentin Cap 100 MG: 100 mg | ORAL | NDC 16714066101

## 2024-10-11 MED ADMIN — Gabapentin Cap 100 MG: 100 mg | ORAL | NDC 67877022201

## 2024-10-11 MED ADMIN — Sodium Chloride IV Soln 0.9%: 1000 mL | INTRAVENOUS | NDC 00338004904

## 2024-10-11 MED ADMIN — Haloperidol Lactate Inj 5 MG/ML: 2 mg | INTRAVENOUS | NDC 67457042600

## 2024-10-11 MED ADMIN — Sertraline HCl Tab 50 MG: 50 mg | ORAL | NDC 60687024211

## 2024-10-21 ENCOUNTER — Other Ambulatory Visit: Payer: Self-pay

## 2024-10-21 ENCOUNTER — Emergency Department (HOSPITAL_COMMUNITY)

## 2024-10-21 ENCOUNTER — Emergency Department (HOSPITAL_COMMUNITY)
Admission: EM | Admit: 2024-10-21 | Discharge: 2024-10-22 | Disposition: A | Discharge status: Home / Self Care | Attending: Emergency Medicine | Admitting: Emergency Medicine

## 2024-10-21 DIAGNOSIS — E119 Type 2 diabetes mellitus without complications: Secondary | ICD-10-CM | POA: Insufficient documentation

## 2024-10-21 DIAGNOSIS — Y9301 Activity, walking, marching and hiking: Secondary | ICD-10-CM | POA: Diagnosis not present

## 2024-10-21 DIAGNOSIS — Z8616 Personal history of COVID-19: Secondary | ICD-10-CM | POA: Diagnosis not present

## 2024-10-21 DIAGNOSIS — S298XXA Other specified injuries of thorax, initial encounter: Secondary | ICD-10-CM

## 2024-10-21 DIAGNOSIS — S299XXA Unspecified injury of thorax, initial encounter: Secondary | ICD-10-CM | POA: Diagnosis not present

## 2024-10-21 DIAGNOSIS — S0990XA Unspecified injury of head, initial encounter: Secondary | ICD-10-CM | POA: Insufficient documentation

## 2024-10-21 DIAGNOSIS — Y92410 Unspecified street and highway as the place of occurrence of the external cause: Secondary | ICD-10-CM | POA: Insufficient documentation

## 2024-10-21 DIAGNOSIS — W19XXXA Unspecified fall, initial encounter: Secondary | ICD-10-CM | POA: Insufficient documentation

## 2024-10-21 DIAGNOSIS — I10 Essential (primary) hypertension: Secondary | ICD-10-CM | POA: Diagnosis not present

## 2024-10-21 LAB — CBG MONITORING, ED: Glucose-Capillary: 120 mg/dL — ABNORMAL HIGH (ref 70–99)

## 2024-10-21 NOTE — ED Triage Notes (Signed)
 Pt BIB [facility] from streets. Pt reports walking down the road and thinks he fell. Pt reports LOC. Swelling noted around right eye. Pt c/o right rib pain. No thinners.  136/88 82HR 100% RA 132CBG

## 2024-10-22 DIAGNOSIS — S0990XA Unspecified injury of head, initial encounter: Secondary | ICD-10-CM | POA: Diagnosis not present

## 2024-10-22 LAB — COMPREHENSIVE METABOLIC PANEL WITH GFR
ALT: 28 U/L (ref 0–44)
AST: 31 U/L (ref 15–41)
Albumin: 4.6 g/dL (ref 3.5–5.0)
Alkaline Phosphatase: 88 U/L (ref 38–126)
Anion gap: 15 (ref 5–15)
BUN: 24 mg/dL — ABNORMAL HIGH (ref 6–20)
CO2: 24 mmol/L (ref 22–32)
Calcium: 9.9 mg/dL (ref 8.9–10.3)
Chloride: 104 mmol/L (ref 98–111)
Creatinine, Ser: 1.03 mg/dL (ref 0.61–1.24)
GFR, Estimated: >60 mL/min
Glucose, Bld: 92 mg/dL (ref 70–99)
Potassium: 3.9 mmol/L (ref 3.5–5.1)
Sodium: 142 mmol/L (ref 135–145)
Total Bilirubin: 0.5 mg/dL (ref 0.0–1.2)
Total Protein: 8.4 g/dL — ABNORMAL HIGH (ref 6.5–8.1)

## 2024-10-22 LAB — CBC WITH DIFFERENTIAL/PLATELET
Abs Immature Granulocytes: 0.03 K/uL (ref 0.00–0.07)
Basophils Absolute: 0 K/uL (ref 0.0–0.1)
Basophils Relative: 1 %
Eosinophils Absolute: 0.1 K/uL (ref 0.0–0.5)
Eosinophils Relative: 1 %
HCT: 43.1 % (ref 39.0–52.0)
Hemoglobin: 14 g/dL (ref 13.0–17.0)
Immature Granulocytes: 0 %
Lymphocytes Relative: 16 %
Lymphs Abs: 1.4 K/uL (ref 0.7–4.0)
MCH: 30.3 pg (ref 26.0–34.0)
MCHC: 32.5 g/dL (ref 30.0–36.0)
MCV: 93.3 fL (ref 80.0–100.0)
Monocytes Absolute: 0.7 K/uL (ref 0.1–1.0)
Monocytes Relative: 8 %
Neutro Abs: 6.3 K/uL (ref 1.7–7.7)
Neutrophils Relative %: 74 %
Platelets: 292 K/uL (ref 150–400)
RBC: 4.62 MIL/uL (ref 4.22–5.81)
RDW: 13.2 % (ref 11.5–15.5)
WBC: 8.5 K/uL (ref 4.0–10.5)
nRBC: 0 % (ref 0.0–0.2)

## 2024-10-22 NOTE — ED Provider Notes (Signed)
 " [place] EMERGENCY DEPARTMENT AT [facility] Provider Note   CSN: [id number] Arrival date & time: 10/21/24  2313     Patient presents with: No chief complaint on file.   Cory Floyd is a 48 y.o. male.   The history is provided by the patient.  Patient with history of bipolar, hypertension presents via EMS for evaluation.  Patient reports he was walking down the street and he thinks he fell.  He reports he struck his head and had loss of consciousness.  He is not on anticoagulation.  Also reports pain in his right chest from the fall.    Past Medical History:  Diagnosis Date   Alcoholic (HCC)    clean for 3 years   Anginal pain    Arthritis    Bipolar 1 disorder (HCC)    COVID-19    Depressed    Diabetes mellitus without complication (HCC)    Dyspnea    Fatty liver    Hypertension    Schizophrenia (HCC)     Prior to Admission medications  Medication Sig Start Date End Date Taking? Authorizing Provider  ARIPiprazole  (ABILIFY ) 15 MG tablet Take 1 tablet (15 mg total) by mouth daily. 09/19/24 10/19/24  Towana Leita SAILOR, MD  ARIPiprazole  (ABILIFY ) 20 MG tablet Take 20 mg by mouth at bedtime. Patient not taking: Reported on 10/11/2024 08/23/24   [provider]  divalproex  (DEPAKOTE  ER) 500 MG 24 hr tablet Take 2 tablets (1,000 mg total) by mouth at bedtime. 09/19/24 10/19/24  Towana Leita SAILOR, MD  gabapentin  (NEURONTIN ) 100 MG capsule Take 1 capsule (100 mg total) by mouth 3 (three) times daily. 09/19/24 10/19/24  Towana Leita SAILOR, MD  mirtazapine  (REMERON ) 15 MG tablet Take 15 mg by mouth at bedtime. Patient not taking: Reported on 10/11/2024 08/23/24   [provider]  Oxcarbazepine  (TRILEPTAL ) 300 MG tablet Take 300 mg by mouth 2 (two) times daily. Patient not taking: Reported on 10/11/2024 08/23/24   [provider]  sertraline  (ZOLOFT ) 50 MG tablet Take 1 tablet (50 mg total) by mouth daily. 09/19/24 10/19/24  Towana Leita SAILOR, MD   traZODone  (DESYREL ) 100 MG tablet Take 1-2 tablets (100-200 mg total) by mouth at bedtime as needed for sleep. 09/19/24 10/19/24  Towana Leita SAILOR, MD  traZODone  (DESYREL ) 100 MG tablet Take 100 mg by mouth at bedtime. Patient not taking: Reported on 10/11/2024 08/23/24   [provider]    Allergies: Carrot [daucus carota], Penicillins, Penicillins, and Principen [ampicillin]    Review of Systems  Updated Vital Signs BP 118/80   Pulse (!) 120   Temp 98.1 F (36.7 C) (Oral)   Resp 19   SpO2 99%   Physical Exam CONSTITUTIONAL: Disheveled and anxious HEAD: Normocephalic/atraumatic EYES: EOMI/PERRL, minimal swelling noted to the right eyelid ENMT: Mucous membranes moist, no evidence of facial trauma NECK: supple no meningeal signs SPINE/BACK:entire spine nontender, no bruising/crepitance/stepoffs noted to spine CV: S1/S2 noted, tachycardic LUNGS: Lungs are clear to auscultation bilaterally, no apparent distress Chest-no bruising, no crepitance ABDOMEN: soft, nontender NEURO: Pt is awake/alert/appropriate, moves all extremitiesx4.  No facial droop.  Patient is ambulatory EXTREMITIES: pulses normal/equal, full ROM, no deformities SKIN: warm, color normal PSYCH: Anxious  (all labs ordered are listed, but only abnormal results are displayed) Labs Reviewed  COMPREHENSIVE METABOLIC PANEL WITH GFR - Abnormal; Notable for the following components:      Result Value   BUN 24 (*)    Total Protein 8.4 (*)  All other components within normal limits  CBG MONITORING, ED - Abnormal; Notable for the following components:   Glucose-Capillary 120 (*)    All other components within normal limits  CBC WITH DIFFERENTIAL/PLATELET    EKG: EKG Interpretation Date/Time:  Tuesday October 21 2024 23:39:44 EST Ventricular Rate:  109 PR Interval:  128 QRS Duration:  94 QT Interval:  326 QTC Calculation: 439 R Axis:   13  Text Interpretation: Sinus tachycardia Otherwise normal ECG  No significant change since last tracing Confirmed by Midge Golas ([id number]) on 10/21/2024 11:43:17 PM  Radiology: DG Chest 2 View Result Date: 10/22/2024 EXAM: 2 VIEW(S) XRAY OF THE CHEST 10/21/2024 11:55:58 PM COMPARISON: 10/11/2024 CLINICAL HISTORY: pain FINDINGS: LUNGS AND PLEURA: No focal pulmonary opacity. No pleural effusion. No pneumothorax. HEART AND MEDIASTINUM: No acute abnormality of the cardiac and mediastinal silhouettes. BONES AND SOFT TISSUES: No acute osseous abnormality. IMPRESSION: 1. No acute findings. Electronically signed by: Franky Stanford MD 10/22/2024 12:44 AM EST RP Workstation: [id number]   CT Head Wo Contrast Result Date: 10/22/2024 EXAM: CT HEAD WITHOUT CONTRAST 10/22/2024 12:01:00 AM TECHNIQUE: CT of the head was performed without the administration of intravenous contrast. Automated exposure control, iterative reconstruction, and/or weight based adjustment of the mA/kV was utilized to reduce the radiation dose to as low as reasonably achievable. COMPARISON: 10/11/2024 CLINICAL HISTORY: Head trauma, moderate-severe. FINDINGS: BRAIN AND VENTRICLES: No acute hemorrhage. No evidence of acute infarct. No hydrocephalus. No extra-axial collection. No mass effect or midline shift. ORBITS: No acute abnormality. SINUSES: No acute abnormality. SOFT TISSUES AND SKULL: No acute soft tissue abnormality. No skull fracture. IMPRESSION: 1. No acute intracranial abnormality. Electronically signed by: Franky Stanford MD 10/22/2024 12:31 AM EST RP Workstation: [id number]     Procedures   Medications Ordered in the ED - No data to display                                  Medical Decision Making Amount and/or Complexity of Data Reviewed Labs: ordered. Radiology: ordered.   Patient presents via EMS after he reportedly fell.  This is patient's ninth ER visit in 6 months.  He has had multiple evaluations previously. While waiting in the waiting room, he ran through the door and demanded  to be seen.  He appears very anxious.  On exam, he is awake and alert and anxious, tachycardic but lungs are clear, no signs of any chest trauma.  No abdominal tenderness. CT head and chest x-ray are normal.  I have viewed both x-ray and CT head.  Labs are overall unremarkable Tachycardia is likely due to his underlying anxiety and he is not hypoxic.  I have low suspicion for PE or other acute cardiopulmonary emergency Patient is safe for discharge      Final diagnoses:  Injury of head, initial encounter  Blunt trauma to chest, initial encounter    ED Discharge Orders     None          Midge Golas, MD 10/22/24 0151  "

## 2024-10-22 NOTE — ED Notes (Signed)
 Upon calling back another pt from waiting room. Cory Floyd walked past front triage doors without being called on or asked to. Pt reporting he took pain medications in lobby and needs to be seen for the headache he has had since he arrived to the emergency department. RN and ED staff explaining to pt he must wait until called on to come back into triage. Pt refusing to go back into the lobby. MD notified. Security notified.

## 2024-10-22 NOTE — ED Notes (Signed)
Save red tube in main lab 

## 2024-11-01 ENCOUNTER — Emergency Department (HOSPITAL_COMMUNITY)
Admission: EM | Admit: 2024-11-01 | Discharge: 2024-11-02 | Disposition: A | Attending: Emergency Medicine | Admitting: Emergency Medicine

## 2024-11-01 ENCOUNTER — Encounter (HOSPITAL_COMMUNITY): Payer: Self-pay | Admitting: Emergency Medicine

## 2024-11-01 ENCOUNTER — Other Ambulatory Visit: Payer: Self-pay

## 2024-11-01 DIAGNOSIS — M6282 Rhabdomyolysis: Secondary | ICD-10-CM | POA: Insufficient documentation

## 2024-11-01 DIAGNOSIS — M79604 Pain in right leg: Secondary | ICD-10-CM | POA: Diagnosis present

## 2024-11-01 NOTE — ED Triage Notes (Signed)
 Pt via [facility] c/o pain to bilateral legs from toes to hips, noticed 2 days ago. Pt states his shoes don't fit properly and he works long hours on his feet. 7/10 pain.

## 2024-11-02 LAB — BASIC METABOLIC PANEL WITH GFR
Anion gap: 10 (ref 5–15)
BUN: 38 mg/dL — ABNORMAL HIGH (ref 6–20)
CO2: 24 mmol/L (ref 22–32)
Calcium: 8.7 mg/dL — ABNORMAL LOW (ref 8.9–10.3)
Chloride: 105 mmol/L (ref 98–111)
Creatinine, Ser: 1.09 mg/dL (ref 0.61–1.24)
GFR, Estimated: >60 mL/min
Glucose, Bld: 115 mg/dL — ABNORMAL HIGH (ref 70–99)
Potassium: 4.2 mmol/L (ref 3.5–5.1)
Sodium: 140 mmol/L (ref 135–145)

## 2024-11-02 LAB — CBC
HCT: 36.6 % — ABNORMAL LOW (ref 39.0–52.0)
Hemoglobin: 12 g/dL — ABNORMAL LOW (ref 13.0–17.0)
MCH: 30.4 pg (ref 26.0–34.0)
MCHC: 32.8 g/dL (ref 30.0–36.0)
MCV: 92.7 fL (ref 80.0–100.0)
Platelets: 167 K/uL (ref 150–400)
RBC: 3.95 MIL/uL — ABNORMAL LOW (ref 4.22–5.81)
RDW: 13.2 % (ref 11.5–15.5)
WBC: 5.8 K/uL (ref 4.0–10.5)
nRBC: 0 % (ref 0.0–0.2)

## 2024-11-02 LAB — CK: Total CK: 1330 U/L — ABNORMAL HIGH (ref 49–397)

## 2024-11-02 MED ORDER — OXYCODONE-ACETAMINOPHEN 5-325 MG PO TABS
1.0000 | ORAL_TABLET | Freq: Once | ORAL | Status: AC
  Administered 2024-11-02: 1 via ORAL
  Filled 2024-11-02: qty 1

## 2024-11-02 MED ORDER — SODIUM CHLORIDE 0.9 % IV BOLUS
1000.0000 mL | Freq: Once | INTRAVENOUS | Status: AC
  Administered 2024-11-02: 1000 mL via INTRAVENOUS

## 2024-11-02 NOTE — ED Provider Notes (Signed)
 " [place] EMERGENCY DEPARTMENT AT [facility] Provider Note   CSN: [id number] Arrival date & time: 11/01/24  2316     Patient presents with: Leg Pain   Cory Floyd is a 49 y.o. male with a history of bipolar 1 disorder, amphetamine use, cocaine abuse, hallucinations, alcohol use disorder, malingering, schizophrenia.  Patient presents to ED for evaluation of bilateral leg pain.  Reports bilateral leg pain has been ongoing for the last 3 to 4 days.  Denies any specific event to account for pain.  Denies trauma.  Denies falls.  Denies fevers, overlying skin change to bilateral lower extremities.  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  Denies leg swelling.  Patient reports pain goes from bilateral toes to bilateral hips.  He reports that his issues do not fit properly.  Reports pain 7 out of 10.  Denies any issues ambulating.   Leg Pain      Prior to Admission medications  Medication Sig Start Date End Date Taking? Authorizing Provider  ARIPiprazole  (ABILIFY ) 15 MG tablet Take 1 tablet (15 mg total) by mouth daily. 09/19/24 10/19/24  Towana Leita SAILOR, MD  ARIPiprazole  (ABILIFY ) 20 MG tablet Take 20 mg by mouth at bedtime. Patient not taking: Reported on 10/11/2024 08/23/24   [provider]  divalproex  (DEPAKOTE  ER) 500 MG 24 hr tablet Take 2 tablets (1,000 mg total) by mouth at bedtime. 09/19/24 10/19/24  Towana Leita SAILOR, MD  gabapentin  (NEURONTIN ) 100 MG capsule Take 1 capsule (100 mg total) by mouth 3 (three) times daily. 09/19/24 10/19/24  Towana Leita SAILOR, MD  mirtazapine  (REMERON ) 15 MG tablet Take 15 mg by mouth at bedtime. Patient not taking: Reported on 10/11/2024 08/23/24   [provider]  Oxcarbazepine  (TRILEPTAL ) 300 MG tablet Take 300 mg by mouth 2 (two) times daily. Patient not taking: Reported on 10/11/2024 08/23/24   [provider]  sertraline  (ZOLOFT ) 50 MG tablet Take 1  tablet (50 mg total) by mouth daily. 09/19/24 10/19/24  Towana Leita SAILOR, MD  traZODone  (DESYREL ) 100 MG tablet Take 1-2 tablets (100-200 mg total) by mouth at bedtime as needed for sleep. 09/19/24 10/19/24  Towana Leita SAILOR, MD  traZODone  (DESYREL ) 100 MG tablet Take 100 mg by mouth at bedtime. Patient not taking: Reported on 10/11/2024 08/23/24   [provider]    Allergies: Carrot [daucus carota], Penicillins, Penicillins, and Principen [ampicillin]    Review of Systems  All other systems reviewed and are negative.   Updated Vital Signs BP 114/75 (BP Location: Left Arm)   Pulse 81   Temp 98.7 F (37.1 C) (Oral)   Resp 16   Ht 5' 10 (1.778 m)   Wt 88.5 kg   SpO2 97%   BMI 27.98 kg/m   Physical Exam Vitals and nursing note reviewed.  Constitutional:      General: He is not in acute distress.    Appearance: He is well-developed.  HENT:     Head: Normocephalic and atraumatic.  Eyes:     Conjunctiva/sclera: Conjunctivae normal.  Cardiovascular:     Rate and Rhythm: Normal rate and regular rhythm.     Heart sounds: No murmur heard. Pulmonary:     Effort: Pulmonary effort is normal. No respiratory distress.     Breath sounds: Normal breath sounds.  Abdominal:     Palpations: Abdomen is soft.     Tenderness: There is  no abdominal tenderness.  Musculoskeletal:        General: No swelling.     Cervical back: Neck supple.     Comments: No external signs of trauma to patient bilateral lower extremities.  No overlying skin change, erythema, induration, fluctuance.  Skin:    General: Skin is warm and dry.     Capillary Refill: Capillary refill takes less than 2 seconds.  Neurological:     Mental Status: He is alert and oriented to person, place, and time. Mental status is at baseline.  Psychiatric:        Mood and Affect: Mood normal.     (all labs ordered are listed, but only abnormal results are displayed) Labs Reviewed  CBC - Abnormal; Notable for the  following components:      Result Value   RBC 3.95 (*)    Hemoglobin 12.0 (*)    HCT 36.6 (*)    All other components within normal limits  BASIC METABOLIC PANEL WITH GFR - Abnormal; Notable for the following components:   Glucose, Bld 115 (*)    BUN 38 (*)    Calcium  8.7 (*)    All other components within normal limits  CK - Abnormal; Notable for the following components:   Total CK 1,330 (*)    All other components within normal limits    EKG: None  Radiology: No results found.  Procedures   Medications Ordered in the ED  oxyCODONE -acetaminophen  (PERCOCET/ROXICET) 5-325 MG per tablet 1 tablet (1 tablet Oral Given 11/02/24 0020)  sodium chloride  0.9 % bolus 1,000 mL (0 mLs Intravenous Stopped 11/02/24 0235)    Medical Decision Making Amount and/or Complexity of Data Reviewed Labs: ordered.  Risk Prescription drug management.   49 year old male presents for evaluation.  On exam, HD stable.  Lung sounds clear bilaterally, no hypoxia.  Abdomen soft and compressible.  Neurological examination at baseline.  Bilateral lower extremities without overlying skin change, induration, fluctuance.  5 out of 5 strength bilateral lower extremities.  Patient ambulates with steady gait.  No evidence of trauma to bilateral lower extremities.  Will collect labs to include CBC, BMP, CK.  Suspect possible degree of rhabdomyolysis, patient does have methamphetamine use in his chart.  Will provide with pain control, 1 tab oxycodone .  Patient CBC is without leukocytosis, no anemia.  Metabolic panel is grossly unremarkable, BUN 38, creatinine stable at 1.09, anion gap 10 with GFR greater than 60.  No electrolyte derangement.  CK is elevated to 1330.  Patient has no nausea or vomiting here.  Stable creatinine.  Will provide with 1 L of fluid and anticipate discharge.  Update: Patient received liter of fluid.  Patient resting comfortably at this time.  Patient was advised that he will need to continue  to hydrate himself at home.  He was advised to discontinue use of illicit substances as I feel these are contributing to his elevated CK.  He was given return precautions and he voiced understanding.  Advised to follow-up outpatient with PCP.  Stable to discharge.   Final diagnoses:  Non-traumatic rhabdomyolysis  Pain in both lower extremities    ED Discharge Orders     None          Ruthell Lonni JULIANNA DEVONNA 11/02/24 0314    Midge Golas, MD 11/02/24 [phone]  "

## 2024-11-02 NOTE — Discharge Instructions (Addendum)
 As we discussed, I feel that your leg pain is most likely due to rhabdomyolysis.  This is sometimes due to use of illicit substances which causes breakdown of muscles.  Please discontinue use of methamphetamine.  Please review attached guides with treatment centers in the area for drug detoxification.  Please continue to hydrate yourself at home by drinking plenty of fluid.  Follow-up with PCP.  I have referred you to 1 if you do not have 1.  Return to ED with new symptoms.

## 2024-11-09 ENCOUNTER — Emergency Department (HOSPITAL_COMMUNITY)

## 2024-11-09 ENCOUNTER — Other Ambulatory Visit: Payer: Self-pay

## 2024-11-09 ENCOUNTER — Emergency Department (HOSPITAL_COMMUNITY)
Admission: EM | Admit: 2024-11-09 | Discharge: 2024-11-09 | Disposition: A | Discharge status: Home / Self Care | Attending: Emergency Medicine | Admitting: Emergency Medicine

## 2024-11-09 DIAGNOSIS — E119 Type 2 diabetes mellitus without complications: Secondary | ICD-10-CM | POA: Diagnosis not present

## 2024-11-09 DIAGNOSIS — R000 Tachycardia, unspecified: Secondary | ICD-10-CM | POA: Diagnosis not present

## 2024-11-09 DIAGNOSIS — F17200 Nicotine dependence, unspecified, uncomplicated: Secondary | ICD-10-CM | POA: Diagnosis not present

## 2024-11-09 DIAGNOSIS — R0789 Other chest pain: Secondary | ICD-10-CM | POA: Insufficient documentation

## 2024-11-09 DIAGNOSIS — R200 Anesthesia of skin: Secondary | ICD-10-CM | POA: Insufficient documentation

## 2024-11-09 DIAGNOSIS — F1590 Other stimulant use, unspecified, uncomplicated: Secondary | ICD-10-CM | POA: Diagnosis not present

## 2024-11-09 DIAGNOSIS — Z5900 Homelessness unspecified: Secondary | ICD-10-CM | POA: Diagnosis not present

## 2024-11-09 DIAGNOSIS — R079 Chest pain, unspecified: Secondary | ICD-10-CM

## 2024-11-09 LAB — TROPONIN T, HIGH SENSITIVITY
Troponin T High Sensitivity: 18 ng/L (ref 0–19)
Troponin T High Sensitivity: 16 ng/L (ref 0–19)

## 2024-11-09 LAB — CBC
HCT: 40.2 % (ref 39.0–52.0)
Hemoglobin: 13.4 g/dL (ref 13.0–17.0)
MCH: 30.3 pg (ref 26.0–34.0)
MCHC: 33.3 g/dL (ref 30.0–36.0)
MCV: 91 fL (ref 80.0–100.0)
Platelets: 251 K/uL (ref 150–400)
RBC: 4.42 MIL/uL (ref 4.22–5.81)
RDW: 13.2 % (ref 11.5–15.5)
WBC: 9.6 K/uL (ref 4.0–10.5)
nRBC: 0 % (ref 0.0–0.2)

## 2024-11-09 LAB — BASIC METABOLIC PANEL WITH GFR
Anion gap: 18 — ABNORMAL HIGH (ref 5–15)
BUN: 32 mg/dL — ABNORMAL HIGH (ref 6–20)
CO2: 21 mmol/L — ABNORMAL LOW (ref 22–32)
Calcium: 9.1 mg/dL (ref 8.9–10.3)
Chloride: 101 mmol/L (ref 98–111)
Creatinine, Ser: 1.29 mg/dL — ABNORMAL HIGH (ref 0.61–1.24)
GFR, Estimated: >60 mL/min
Glucose, Bld: 125 mg/dL — ABNORMAL HIGH (ref 70–99)
Potassium: 3.5 mmol/L (ref 3.5–5.1)
Sodium: 140 mmol/L (ref 135–145)

## 2024-11-09 LAB — ETHANOL: Alcohol, Ethyl (B): <15 mg/dL

## 2024-11-09 MED ORDER — NITROGLYCERIN 0.4 MG SL SUBL: 0.4000 mg | SUBLINGUAL_TABLET | SUBLINGUAL | Status: DC | PRN

## 2024-11-09 MED ORDER — SODIUM CHLORIDE 0.9 % IV BOLUS
1000.0000 mL | Freq: Once | INTRAVENOUS | Status: AC
  Administered 2024-11-09: 1000 mL via INTRAVENOUS

## 2024-11-09 MED ORDER — ASPIRIN 81 MG PO CHEW
324.0000 mg | CHEWABLE_TABLET | Freq: Once | ORAL | Status: AC
  Administered 2024-11-09: 324 mg via ORAL
  Filled 2024-11-09: qty 4

## 2024-11-09 NOTE — Discharge Instructions (Signed)
 Your workup this evening was reassuring.  Please avoid using illicit substances such as methamphetamine as this may cause chest pain or further complications.  Return to the emergency department if you develop any life-threatening symptoms.

## 2024-11-09 NOTE — ED Provider Notes (Signed)
 " [place] EMERGENCY DEPARTMENT AT [facility] Provider Note   CSN: [id number] Arrival date & time: 11/09/24  9682     Patient presents with: Chest Pain   Cory Floyd is a 49 y.o. male.  Patient with past medical history significant for methamphetamine use disorder, bipolar affective disorder, alcohol use disorder, type II DM, anginal pain, schizophrenia presents the emergency department via EMS complaining of chest pain.  He reports pain that came on suddenly with some radiation to his back.  Pain was initially 9 out of 10 in severity and improved to 7 out of 10 in severity after nitroglycerin  administered by EMS.  He endorses some nausea and shortness of breath which has resolved.  Patient also states that while riding on the stretcher his left leg began to feel numb.    Chest Pain      Prior to Admission medications  Medication Sig Start Date End Date Taking? Authorizing Provider  ARIPiprazole  (ABILIFY ) 15 MG tablet Take 1 tablet (15 mg total) by mouth daily. 09/19/24 10/19/24  Towana Leita SAILOR, MD  ARIPiprazole  (ABILIFY ) 20 MG tablet Take 20 mg by mouth at bedtime. Patient not taking: Reported on 10/11/2024 08/23/24   [provider]  divalproex  (DEPAKOTE  ER) 500 MG 24 hr tablet Take 2 tablets (1,000 mg total) by mouth at bedtime. 09/19/24 10/19/24  Towana Leita SAILOR, MD  gabapentin  (NEURONTIN ) 100 MG capsule Take 1 capsule (100 mg total) by mouth 3 (three) times daily. 09/19/24 10/19/24  Towana Leita SAILOR, MD  mirtazapine  (REMERON ) 15 MG tablet Take 15 mg by mouth at bedtime. Patient not taking: Reported on 10/11/2024 08/23/24   [provider]  Oxcarbazepine  (TRILEPTAL ) 300 MG tablet Take 300 mg by mouth 2 (two) times daily. Patient not taking: Reported on 10/11/2024 08/23/24   [provider]  sertraline  (ZOLOFT ) 50 MG tablet Take 1 tablet (50 mg total) by mouth daily. 09/19/24 10/19/24  Towana Leita SAILOR, MD  traZODone  (DESYREL ) 100 MG tablet Take  1-2 tablets (100-200 mg total) by mouth at bedtime as needed for sleep. 09/19/24 10/19/24  Towana Leita SAILOR, MD  traZODone  (DESYREL ) 100 MG tablet Take 100 mg by mouth at bedtime. Patient not taking: Reported on 10/11/2024 08/23/24   [provider]    Allergies: Carrot [daucus carota], Penicillins, Penicillins, and Principen [ampicillin]    Review of Systems  Cardiovascular:  Positive for chest pain.    Updated Vital Signs BP (!) 144/93   Pulse (!) 108   Temp 98.1 F (36.7 C) (Oral)   Resp 15   Ht 5' 10 (1.778 m)   Wt 88.5 kg   SpO2 100%   BMI 27.98 kg/m   Physical Exam Vitals and nursing note reviewed.  Constitutional:      General: He is not in acute distress.    Appearance: He is well-developed.  HENT:     Head: Normocephalic and atraumatic.  Eyes:     Conjunctiva/sclera: Conjunctivae normal.  Cardiovascular:     Rate and Rhythm: Regular rhythm. Tachycardia present.  Pulmonary:     Effort: Pulmonary effort is normal. No respiratory distress.     Breath sounds: Normal breath sounds.  Chest:     Chest wall: No tenderness.  Abdominal:     Palpations: Abdomen is soft.     Tenderness: There is no abdominal tenderness.  Musculoskeletal:        General: No swelling.     Cervical back: Neck supple.  Right lower leg: No tenderness. No edema.     Left lower leg: No tenderness. No edema.  Skin:    General: Skin is warm and dry.     Capillary Refill: Capillary refill takes less than 2 seconds.  Neurological:     General: No focal deficit present.     Mental Status: He is alert.  Psychiatric:        Mood and Affect: Mood normal.     (all labs ordered are listed, but only abnormal results are displayed) Labs Reviewed  BASIC METABOLIC PANEL WITH GFR - Abnormal; Notable for the following components:      Result Value   CO2 21 (*)    Glucose, Bld 125 (*)    BUN 32 (*)    Creatinine, Ser 1.29 (*)    Anion gap 18 (*)    All other components within normal  limits  CBC  ETHANOL  URINE DRUG SCREEN  TROPONIN T, HIGH SENSITIVITY  TROPONIN T, HIGH SENSITIVITY    EKG: None  Radiology: DG Chest Port 1 View Result Date: 11/09/2024 EXAM: 1 VIEW XRAY OF THE CHEST 11/09/2024 03:49:52 AM COMPARISON: 10/21/2024 CLINICAL HISTORY: Chest pain FINDINGS: LUNGS AND PLEURA: No focal pulmonary opacity. No pleural effusion. No pneumothorax. HEART AND MEDIASTINUM: No acute abnormality of the cardiac and mediastinal silhouettes. BONES AND SOFT TISSUES: No acute osseous abnormality. IMPRESSION: 1. No acute cardiopulmonary pathology. Electronically signed by: Oneil Devonshire MD 11/09/2024 03:56 AM EST RP Workstation: [id number]     Procedures   Medications Ordered in the ED  nitroGLYCERIN  (NITROSTAT ) SL tablet 0.4 mg (has no administration in time range)  aspirin  chewable tablet 324 mg (324 mg Oral Given 11/09/24 0339)  sodium chloride  0.9 % bolus 1,000 mL (0 mLs Intravenous Stopped 11/09/24 0534)                                    Medical Decision Making Amount and/or Complexity of Data Reviewed Labs: ordered. Radiology: ordered.  Risk OTC drugs. Prescription drug management.   This patient presents to the ED for concern of chest pain, this involves an extensive number of treatment options, and is a complaint that carries with it a high risk of complications and morbidity.  The differential diagnosis includes substance-induced chest pain, ACS, pneumonia, musculoskeletal pain, PE, others   Co morbidities / Chronic conditions that complicate the patient evaluation  As noted in HPI   Additional history obtained:  Additional history obtained from EMR  Lab Tests:  I Ordered, and personally interpreted labs.  The pertinent results include: Initial troponin 18, repeat 16   Imaging Studies ordered:  I ordered imaging studies including chest x-ray I independently visualized and interpreted imaging which showed no acute findings I agree with the  radiologist interpretation   Cardiac Monitoring: / EKG:  The patient was maintained on a cardiac monitor.  I personally viewed and interpreted the cardiac monitored which showed an underlying rhythm of: Sinus tachycardia   Problem List / ED Course / Critical interventions / Medication management   I ordered medication including saline and aspirin .  Patient declined nitroglycerin  Reevaluation of the patient after these medicines showed that the patient improved I have reviewed the patients home medicines and have made adjustments as needed   Social Determinants of Health:  Patient is a daily smoker with history of homelessness   Test / Admission - Considered:  Patient's chest  pain has improved.  His tachycardia has also improved.  Suspect this was related to the patient's reported methamphetamine use earlier this evening.  Patient has 2 negative troponins and a nonischemic EKG.  No sign of ACS.  Presentation not consistent with pulmonary embolism or dissection.  No pneumonia on chest x-ray.  Normal mediastinum..  Correlates with timing of patient's methamphetamine use.  He has a history of previous chest pain also suspected to be caused by substance abuse.  At this time patient appears stable for discharge home.  I see no indication for further emergent workup or admission.  Return precautions provided.      Final diagnoses:  Chest pain, unspecified type  Methamphetamine use    ED Discharge Orders     None          Logan Ubaldo KATHEE DEVONNA 11/09/24 9383  "

## 2024-11-09 NOTE — ED Notes (Signed)
 Pt reports 3/10 CP and declines SL nitroglycerin  at this time.

## 2024-11-09 NOTE — ED Triage Notes (Addendum)
 Pt BIB EMS who reports pt was walking to bar to charge his phone and he had a sudden onset of chest pain, radiates to back between shoulder blades. 9/10 initially, 7/10 after nitroglycerin  x2. Endorses SHOB and nausea pta. Pt also states he has numbness in left leg that began in ambulance and consumed 2 shots of liquor prior to arrival.    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 No aspirin  administered d/t etoh EMS VS 130/80, HR 140, 96% RA

## 2024-11-17 ENCOUNTER — Ambulatory Visit (HOSPITAL_COMMUNITY)
Admission: EM | Admit: 2024-11-17 | Discharge: 2024-11-18 | Disposition: A | Discharge status: Home / Self Care | Attending: Psychiatry | Admitting: Psychiatry

## 2024-11-17 DIAGNOSIS — F1994 Other psychoactive substance use, unspecified with psychoactive substance-induced mood disorder: Secondary | ICD-10-CM | POA: Diagnosis not present

## 2024-11-17 DIAGNOSIS — F1520 Other stimulant dependence, uncomplicated: Secondary | ICD-10-CM

## 2024-11-17 DIAGNOSIS — R000 Tachycardia, unspecified: Secondary | ICD-10-CM

## 2024-11-17 DIAGNOSIS — F19959 Other psychoactive substance use, unspecified with psychoactive substance-induced psychotic disorder, unspecified: Secondary | ICD-10-CM

## 2024-11-17 LAB — COMPREHENSIVE METABOLIC PANEL WITH GFR
ALT: 32 U/L (ref 0–44)
AST: 28 U/L (ref 15–41)
Albumin: 4.2 g/dL (ref 3.5–5.0)
Alkaline Phosphatase: 70 U/L (ref 38–126)
Anion gap: 15 (ref 5–15)
BUN: 17 mg/dL (ref 6–20)
CO2: 25 mmol/L (ref 22–32)
Calcium: 9.2 mg/dL (ref 8.9–10.3)
Chloride: 101 mmol/L (ref 98–111)
Creatinine, Ser: 1.05 mg/dL (ref 0.61–1.24)
GFR, Estimated: >60 mL/min
Glucose, Bld: 123 mg/dL — ABNORMAL HIGH (ref 70–99)
Potassium: 3.3 mmol/L — ABNORMAL LOW (ref 3.5–5.1)
Sodium: 142 mmol/L (ref 135–145)
Total Bilirubin: 0.4 mg/dL (ref 0.0–1.2)
Total Protein: 7.2 g/dL (ref 6.5–8.1)

## 2024-11-17 LAB — CK: Total CK: 160 U/L (ref 49–397)

## 2024-11-17 LAB — CBC WITH DIFFERENTIAL/PLATELET
Abs Immature Granulocytes: 0.02 10*3/uL (ref 0.00–0.07)
Basophils Absolute: 0 10*3/uL (ref 0.0–0.1)
Basophils Relative: 1 %
Eosinophils Absolute: 0 10*3/uL (ref 0.0–0.5)
Eosinophils Relative: 1 %
HCT: 37.1 % — ABNORMAL LOW (ref 39.0–52.0)
Hemoglobin: 12.5 g/dL — ABNORMAL LOW (ref 13.0–17.0)
Immature Granulocytes: 0 %
Lymphocytes Relative: 25 %
Lymphs Abs: 1.5 10*3/uL (ref 0.7–4.0)
MCH: 30.7 pg (ref 26.0–34.0)
MCHC: 33.7 g/dL (ref 30.0–36.0)
MCV: 91.2 fL (ref 80.0–100.0)
Monocytes Absolute: 0.5 10*3/uL (ref 0.1–1.0)
Monocytes Relative: 8 %
Neutro Abs: 3.9 10*3/uL (ref 1.7–7.7)
Neutrophils Relative %: 65 %
Platelets: 242 10*3/uL (ref 150–400)
RBC: 4.07 MIL/uL — ABNORMAL LOW (ref 4.22–5.81)
RDW: 13.6 % (ref 11.5–15.5)
WBC: 5.9 10*3/uL (ref 4.0–10.5)
nRBC: 0 % (ref 0.0–0.2)

## 2024-11-17 LAB — ETHANOL: Alcohol, Ethyl (B): <15 mg/dL

## 2024-11-17 LAB — VALPROIC ACID LEVEL: Valproic Acid Lvl: <10 ug/mL — ABNORMAL LOW (ref 50–100)

## 2024-11-17 LAB — SARS CORONAVIRUS 2 BY RT PCR: SARS Coronavirus 2 by RT PCR: NEGATIVE

## 2024-11-17 LAB — HEMOGLOBIN A1C
Hgb A1c MFr Bld: 5.6 % (ref 4.8–5.6)
Mean Plasma Glucose: 114.02 mg/dL

## 2024-11-17 LAB — LIPID PANEL
Cholesterol: 142 mg/dL (ref 0–200)
HDL: 56 mg/dL
LDL Cholesterol: 59 mg/dL (ref 0–99)
Total CHOL/HDL Ratio: 2.5 ratio
Triglycerides: 133 mg/dL
VLDL: 27 mg/dL (ref 0–40)

## 2024-11-17 MED ORDER — ACETAMINOPHEN 325 MG PO TABS: 650.0000 mg | ORAL_TABLET | Freq: Four times a day (QID) | ORAL | Status: DC | PRN

## 2024-11-17 MED ORDER — LORAZEPAM 2 MG/ML IJ SOLN: 2.0000 mg | Freq: Three times a day (TID) | INTRAMUSCULAR | Status: DC | PRN

## 2024-11-17 MED ORDER — HALOPERIDOL 5 MG PO TABS: 5.0000 mg | ORAL_TABLET | Freq: Three times a day (TID) | ORAL | Status: DC | PRN

## 2024-11-17 MED ORDER — DIPHENHYDRAMINE HCL 50 MG PO CAPS: 50.0000 mg | ORAL_CAPSULE | Freq: Three times a day (TID) | ORAL | Status: DC | PRN

## 2024-11-17 MED ORDER — MAGNESIUM HYDROXIDE 400 MG/5ML PO SUSP: 30.0000 mL | Freq: Every day | ORAL | Status: DC | PRN

## 2024-11-17 MED ORDER — HALOPERIDOL LACTATE 5 MG/ML IJ SOLN: 5.0000 mg | Freq: Three times a day (TID) | INTRAMUSCULAR | Status: DC | PRN

## 2024-11-17 MED ORDER — TRAZODONE HCL 50 MG PO TABS
50.0000 mg | ORAL_TABLET | Freq: Every evening | ORAL | Status: DC | PRN
  Administered 2024-11-17: 50 mg via ORAL
  Filled 2024-11-17: qty 1

## 2024-11-17 MED ORDER — DIPHENHYDRAMINE HCL 50 MG/ML IJ SOLN: 50.0000 mg | Freq: Three times a day (TID) | INTRAMUSCULAR | Status: DC | PRN

## 2024-11-17 MED ORDER — HALOPERIDOL LACTATE 5 MG/ML IJ SOLN: 10.0000 mg | Freq: Three times a day (TID) | INTRAMUSCULAR | Status: DC | PRN

## 2024-11-17 MED ORDER — ALUM & MAG HYDROXIDE-SIMETH 200-200-20 MG/5ML PO SUSP: 30.0000 mL | ORAL | Status: DC | PRN

## 2024-11-17 MED ORDER — OLANZAPINE 10 MG PO TABS
10.0000 mg | ORAL_TABLET | Freq: Every day | ORAL | Status: DC
  Administered 2024-11-17: 10 mg via ORAL
  Filled 2024-11-17: qty 1

## 2024-11-17 NOTE — ED Provider Notes (Cosign Needed)
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 08/19/2024 YELLOW  YELLOW Final   APPearance 08/19/2024 CLEAR  CLEAR Final   Specific Gravity, Urine 08/19/2024 1.020  1.005 - 1.030 Final   pH 08/19/2024 7.0  5.0 - 8.0 Final   Glucose, UA 08/19/2024 NEGATIVE  NEGATIVE mg/dL Final   Hgb urine dipstick 08/19/2024 NEGATIVE  NEGATIVE Final   Bilirubin Urine 08/19/2024 NEGATIVE   NEGATIVE Final   Ketones, ur 08/19/2024 NEGATIVE  NEGATIVE mg/dL Final   Protein, ur 89/71/7974 NEGATIVE  NEGATIVE mg/dL Final   Nitrite 89/71/7974 NEGATIVE  NEGATIVE Final   Leukocytes,Ua 08/19/2024 NEGATIVE  NEGATIVE Final   Performed at [facility], [address] [place], [place] [address]    Allergies: Carrot [daucus carota], Penicillins, Penicillins, and Principen [ampicillin]  Medications:  Facility Ordered Medications  Medication   acetaminophen  (TYLENOL ) tablet 650 mg   alum & mag hydroxide-simeth (MAALOX/MYLANTA) 200-200-20 MG/5ML suspension 30 mL   magnesium  hydroxide (MILK OF MAGNESIA) suspension 30 mL   haloperidol  (HALDOL ) tablet 5 mg   And   diphenhydrAMINE  (BENADRYL ) capsule 50 mg   haloperidol  lactate (HALDOL ) injection 5 mg   And   diphenhydrAMINE  (BENADRYL ) injection 50 mg   And   LORazepam  (ATIVAN ) injection 2 mg   haloperidol  lactate (HALDOL ) injection 10 mg   And   diphenhydrAMINE  (BENADRYL ) injection 50 mg   And   LORazepam  (ATIVAN ) injection 2 mg   OLANZapine  (ZYPREXA ) tablet 10 mg   PTA Medications  Medication Sig   ARIPiprazole  (ABILIFY ) 15 MG tablet Take 1 tablet (15 mg total) by mouth daily.   sertraline  (ZOLOFT ) 50 MG tablet Take 1 tablet (50 mg total) by mouth daily.   traZODone  (DESYREL ) 100 MG tablet Take 1-2 tablets (100-200 mg total) by mouth at bedtime as needed for sleep.   divalproex  (DEPAKOTE  ER) 500 MG 24 hr tablet Take 2 tablets (1,000 mg total) by mouth at bedtime.  gabapentin  (NEURONTIN ) 100 MG capsule Take 1 capsule (100 mg total) by mouth 3 (three) times daily.   ARIPiprazole  (ABILIFY ) 20 MG tablet Take 20 mg by mouth at bedtime. (Patient not taking: Reported on 10/11/2024)   mirtazapine  (REMERON ) 15 MG tablet Take 15 mg by mouth at bedtime. (Patient not taking: Reported on 10/11/2024)   Oxcarbazepine  (TRILEPTAL ) 300 MG tablet Take 300 mg by mouth 2 (two) times daily. (Patient not taking: Reported on 10/11/2024)   traZODone   (DESYREL ) 100 MG tablet Take 100 mg by mouth at bedtime. (Patient not taking: Reported on 10/11/2024)      Medical Decision Making  Patient presenting after 3 day binge of IV methamphetamine after 3 weeks of sobriety. He endorses some symptoms of depression and psychosis although these appear to be predominantly substance induced. Plan to initiate olanzapine  to aid with reported symptoms of psychosis and insomnia. It is unclear the accuracy of his bipolar diagnosis given his hospitalizations appear predominantly in the context of substance abuse but olanzapine  can act as mood stabilizer as well. Plan to admit to obs to allow methamphetamine to metabolize and likely d/c tomorrow to [facility].    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  Prentice Espy, MD 11/17/24  4:39 PM
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
## 2024-11-17 NOTE — ED Notes (Addendum)
 Patient presented to [facility] via [facility] per advise of his social worker due to being on a 3 day drug binge. Patient appeared very anxious however he is redirectable. Patients lab work was obtained, patient was brought on unit and food was given. Patient was not able to provide urine sample at the time of admission. Patient was given UDS cup to provide at later time,
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
## 2024-11-17 NOTE — ED Notes (Signed)
 RN spoke with patient A&Ox2. Denies intent to harm self/others when asked. Denies A/VH or any physical complaints when asked. No acute distress noted. Active listening, support and encouragement provided. Routine safety checks conducted according to facility protocol. Encouraged patient to notify staff if thoughts of harm toward self or others arise. Patient verbalize understanding and agreement. Snacks, meds and beverages given

## 2024-11-17 NOTE — ED Notes (Signed)
Patient resting with no s/s of distress
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
## 2024-11-18 LAB — POCT URINE DRUG SCREEN - MANUAL ENTRY (I-SCREEN)
POC Amphetamine UR: POSITIVE — AB
POC Buprenorphine (BUP): NOT DETECTED
POC Cocaine UR: POSITIVE — AB
POC Marijuana UR: NOT DETECTED
POC Methadone UR: NOT DETECTED
POC Methamphetamine UR: NOT DETECTED
POC Morphine: NOT DETECTED
POC Oxazepam (BZO): NOT DETECTED
POC Oxycodone UR: NOT DETECTED
POC Secobarbital (BAR): NOT DETECTED

## 2024-11-18 LAB — HEPATITIS PANEL, ACUTE
HCV Ab: REACTIVE — AB
Hep A IgM: NONREACTIVE
Hep B C IgM: NONREACTIVE
Hepatitis B Surface Ag: NONREACTIVE

## 2024-11-18 MED ORDER — OLANZAPINE 10 MG PO TABS: 10.0000 mg | ORAL_TABLET | Freq: Every day | ORAL | 0 refills | Status: DC

## 2024-11-18 NOTE — ED Provider Notes (Signed)
 FBC/OBS ASAP Discharge Summary  Date and Time: 11/18/2024 3:07 PM  Name: Cory Floyd  MRN:  [id number]   Discharge Diagnoses:  Final diagnoses:  Substance-induced psychotic disorder [facility])  Substance induced mood disorder (HCC)  Methamphetamine use disorder, severe (HCC)    Subjective: Seen and assessed at bedside. Denies SI/HI/AVH. States he slept very well last night and is very hungry as he did not eat for 3 days since being on meth binge. Discussed having him follow up with substance abuse outpatient and he was amenable. Discussed how [facility] at capacity but he should reach out to them. Denies any problems from initiation of olanzapine . He was perseverative on food during assessment and asking for more food. Discussed we would provide what we are able to. He was amenable to discharge and feels better than yesterday.  Stay Summary:  Cory Floyd is a 49 y.o. male  with a past psychiatric history of stimulant use disorder (methamphetamine, cocaine, alcohol), bipolar disorder, and multiple past psychiatric hospitalizations and an ACT team. Patient arrived 11/17/24 to [facility] due to auditory hallucinations and paranoia following from 3 day meth binge . PPHx is significant for bipolar 1 disorder, stimulant use disorder, and history of Suicide Attempts, Self Injurious Behavior, or Prior Psychiatric Hospitalizations. PMHx is significant for rhabdomyolysis.  Admitted to observation to allow for methamphetamine to metabolize and reassess in the morning. Started on olanzapine  to aid with paranoia. No acute somatic complaints. Labs obtained pertinent for UDS+amphetamine and cocaine, CK wnl, VPA<10, reactive hepatitis C, Hgb 12.5, lipid panel wnl, A1c 5.6.   Total Time spent with patient: 1 hour  Past Psychiatric History: see H&P Tobacco Cessation:  N/A, patient does not currently use tobacco products   PTA Medications:  PTA Medications  Medication Sig   OLANZapine  (ZYPREXA ) 10 MG tablet Take 1  tablet (10 mg total) by mouth at bedtime.   Facility Ordered Medications  Medication   acetaminophen  (TYLENOL ) tablet 650 mg   alum & mag hydroxide-simeth (MAALOX/MYLANTA) 200-200-20 MG/5ML suspension 30 mL   magnesium  hydroxide (MILK OF MAGNESIA) suspension 30 mL   haloperidol  (HALDOL ) tablet 5 mg   And   diphenhydrAMINE  (BENADRYL ) capsule 50 mg   haloperidol  lactate (HALDOL ) injection 5 mg   And   diphenhydrAMINE  (BENADRYL ) injection 50 mg   And   LORazepam  (ATIVAN ) injection 2 mg   haloperidol  lactate (HALDOL ) injection 10 mg   And   diphenhydrAMINE  (BENADRYL ) injection 50 mg   And   LORazepam  (ATIVAN ) injection 2 mg   OLANZapine  (ZYPREXA ) tablet 10 mg   traZODone  (DESYREL ) tablet 50 mg        No data to display          Flowsheet Row ED from 11/17/2024 in [facility] ED from 11/09/2024 in [facility] Department at [facility] ED from 11/01/2024 in [facility] Emergency Department at [facility]  C-SSRS RISK CATEGORY No Risk No Risk No Risk     Psychiatric Specialty Exam  Presentation  General Appearance:  Appropriate for Environment; Casual   Eye Contact: Fair   Speech: Clear and Coherent; Normal Rate   Speech Volume: Normal    Mood and Affect  Mood: Depressed   Affect: Appropriate; Congruent; Tearful    Thought Process  Thought Processes: Coherent; Goal Directed; Linear   Descriptions of Associations:Intact   Orientation:Full (Time, Place and Person)   Thought Content:WDL   Diagnosis of Schizophrenia or Schizoaffective disorder in past: No  Duration of Psychotic Symptoms: Greater than six months    Hallucinations:Hallucinations: None  Ideas of Reference:None   Suicidal Thoughts:Suicidal Thoughts: No  Homicidal Thoughts:Homicidal Thoughts: No   Sensorium  Memory: Not formally assessed  Judgment: Impaired   Insight: Lacking    Executive Functions   Concentration: Fair   Attention Span: Fair   Recall: Fair   Fund of Knowledge: Fair   Language: Fair    Psychomotor Activity  Psychomotor Activity: Psychomotor Activity: Normal   Assets  Assets: Resilience    Sleep  Sleep: Sleep: Fair  No Safety Checks orders active in given range  Nutritional Assessment (For OBS and FBC admissions only) Has the patient had a weight loss or gain of 10 pounds or more in the last 3 months?: No Has the patient had a decrease in food intake/or appetite?: No Does the patient have dental problems?: No Does the patient have eating habits or behaviors that may be indicators of an eating disorder including binging or inducing vomiting?: No Has the patient recently lost weight without trying?: 0 Has the patient been eating poorly because of a decreased appetite?: 0 Malnutrition Screening Tool Score: 0    Physical Exam  Physical Exam ROS Blood pressure 115/82, pulse 82, temperature 97.7 F (36.5 C), temperature source Oral, resp. rate 18, SpO2 98%. There is no height or weight on file to calculate BMI.  Suicide Risk Assessment   Suicide Risk Assessment:  Suicidal ideation/thoughts:   []  Current         []  Recent         []  Denies        []  Remote   [x]  Chronic  Intention to act or plan:        []  Current     []  Recent [x]  Denies  []  Remote  Preparatory behavior:  []  Recent    [x]  Denies Recent      []  Remote Instance  Suicide attempts:  []  Immediately prior to this admission     []  During this admission    []  Recent    [x]  Multiple   []  Remote    []  Denies Ever     Risk Factors  Protective Factors  Acute  Recent suicide attempt and Recent loss (job, relationship, family member) AcuteSuicideProtectiveFactors: No access to highly lethal means, Denies current SI or Intent, Not in acute distress, No signs of apparent anger/rage, Future oriented, and Sense of purpose  Chronic Previous suicide attempt, Previous self-harm  behaviors, Major psychiatric disorder, Chronic homelessness, Caucasian Race, and Male sex Good coping or problem solving skills, Good physical health, Age (85-59), Willingness to seek help, and Medication adherence   Potential future factors that may impact risk: FutureSuicideFactors : None  Summary: While it is impossible to accurately predict with absolute certainty future events and human behaviors, an assessment of current suicidal indicators, risk factors, and protective factors suggests that this patient's:   Acute suicide risk is: mild in degree.    Chronic suicide risk is: moderate in degree.   Suicide risk increases with substance/alcohol use and acute intoxication.  Plan Of Care/Follow-up recommendations:  -Printed script for olanzapine  to aid with paranoia and psychosis -Outpatient substance use counseling resources provided including 2 PM intake interview -bus passes provided for transportation  Prentice Espy, MD 11/18/2024, 3:08 PM

## 2024-11-18 NOTE — Care Management (Signed)
 OBS Care Management   Writer has an intake phone assessment today at 2pm for outpatient substance abuse services and Peer support services with [facility] [phone])  Writer informed the MD working with the patient

## 2024-11-18 NOTE — ED Notes (Signed)
 Patient started cussing and yelling at staff while in locker room, security was called to stand by. Patient has been escorted out of building.

## 2024-11-18 NOTE — ED Notes (Signed)
 Patient is lying in bed with eyes closed, patient has been advised that he is being discharged, patient has been provided his phone to obtain his ACT nurse [facility] phone number. Patient has not yet called her, this nurse advised patient that I will be printing out his after visit summary and providing it to him shortly.

## 2024-11-18 NOTE — ED Notes (Signed)
 Patient is being provided with after visit summary, prescription and bus passes for discharge.

## 2024-11-18 NOTE — ED Notes (Signed)
 Pt is currently sleeping, no distress noted, environmental check complete, will continue to monitor patient for safety.

## 2024-11-18 NOTE — Discharge Instructions (Signed)
 .. Substance Abuse Resources     SUBSTANCE USE TREATMENT for Medicaid and State Funded/IPRS  [facility] [address], [place], [address] [phone] phone NOTE: [facility] is no longer offering IOP services.  Serves those who are low-income or have no insurance.  [facility] [address] [place], [place], [address] [phone] phone [phone] fax NOTE: Does have Substance Abuse-Intensive Outpatient Program [facility]) as well as transitional housing if eligible.  [facility] [address] [place], [place], [address] [phone] phone [phone] fax  [facility] [phone] [place], [place], [address] [phone] phone [phone] fax    HALFWAY HOUSES:  [facility] [phone]  [employer].[email or web address]     12 STEP PROGRAMS:  Alcoholics Anonymous of [place] SoftwareChalet.be  Narcotics Anonymous of [place] HitProtect.dk  Al-Anon of [place], [place] www.[place]alanon.org/find-meetings.html  Nar-Anon https://nar-anon.org/find-a-meetin      List of Residential placements:   [facility] in [place]: [phone]  [facility]: [phone]  [place], [place] [phone]: Male and male facility; 30-day program: (uninsured and Medicaid such as Cheryll Corti, [place], [place], partners)  [facility]: [phone]; men and women's facility; 28 days; Can have Medicaid tailored plan Tour manager or Partners)  [facility]: [phone] Milda Aline or Tanis Fan; 28 day program; must be fully detox; tailored Medicaid or no insurance  [address] in [place], [place]; [phone]; 28 day all males program; no insurance accepted  [facility] Referral in [place]: Asa Lauth [phone] (no insurance or Medicaid only); 90 days; outpatient services but provide housing in apartments  downtown [facility]  [facility] Admission: [phone]: Patient must complete phone screening for placement: [place], [place]; 6 month program; uninsured, Medicaid, and [employer].   [facility]: no insurance required; [phone]  [facility]: [phone]; Intake: Porfirio Bristol; Must fill out application online; Charlcie Conger Delay [phone] x [address] [facility] in [place], [place]: [phone]; Admissions Coordinators Mr. Cornel Diesel or Carolynne Citron; 90 day program.  [facility]: [place], [place] [phone]; Co-Ed 9 month to a year program; Online application; Men entry fee is $500 (6-28months);  [employer]: [address] [place], [place] [address]; no fee or insurance required; minimum of 2 years; Highly structured; work based; Intake Coordinator is Larinda Plover [phone]  [facility] in [place], [place]: [phone]; Fax number is [phone]; website: www.[facility].org; Requires 3-6 page autobiography; 2 year program (18 months and then 28month transitional housing); Admission fee is $300; no insurance needed; work Automotive engineer in [place], [place]: [employer] Desk Staff: Annalee Barren [phone]: They have a Men's Regenerations Program 6-40months. Free program; There is an initial $300 fee however, they are willing to work with patients regarding that. Application is online.  [facility]: Admissions [phone] Almeta Arm ext [phone]; Any 7-90 day program is out of pocket; 12 month program is free of charge; there is a $275 entry fee; Patient is responsible for own transportation    .[facility]  [facility] [address] [place], [place], [address] [phone] phone  Offers food and emergency or transitional housing to men, women, or families in need. Clients participate in programs and workshops developed to promote self-sufficiency and personal development.Call or walk in. Applications are accepted Monday, Wednesday,  and Friday by appointment only. Need photo ID and proof of income.  [facility] [address], [place], [place] [address] [phone] Population served: Adult men & women (5 years old  and older, able to perform activities for daily living) Documents required: Valid ID & Social Security Card  [facility] [address] [place], [place]  [address] [phone] Population served: Families with children  [facility] [address] [place], [place]  [address] [phone] Population served: Single women 18+ without dependents Documents required:  Valid ID & Social Security Card  [facility] [address], [place], [place]  [address] [phone] Population served: Male veterans 18+ with substance abuse/mental health issues Eligibility: By referral only  [facility] [address], [place], [place] [address] [phone] Population served: Males 18+ Documents required: Valid ID & Social Security Card  [facility] at [employer] [facility] [place], [employer]. [address] [place] [address] [phone] or [phone] Population served: Pregnant women with or without children  Documents required: Valid ID & Social Security Ship broker of [employer] [address] [place], [place] [address] [phone] Population Served: Families with children  [facility] [address] [place], [place] [address] [phone] Population served: Men 18+, preference for disabled and/or veterans Eligibility: By referral only  [facility]) [facility] [address] [place], [place], [place] [address] [phone] or [phone] Population served: Families with children.    WOMEN ONLY  The Shelter serves up to 20 women each night. Open from December 11th through the end of March in the  evenings from 5:30 pm until 7:30 am, the Shelter provides a hot evening meal, shower and sleeping facilities, and food for the next day. Secure parking is available beside the building.     Shelter Address   Directions [facility] [place] [facility] [address] [place], [place]  If you are in need of housing through the shelter, contact the [employer] at [phone].

## 2024-11-18 NOTE — ED Notes (Signed)
Patient sleeping with no s/s of distress.

## 2024-11-18 NOTE — ED Notes (Signed)
 Patient has awaken, patient is sitting on recliner eating, patient appears to be in no acute distress, respirations are even and unlabored, will continue to monitor patient for safety.

## 2024-11-19 ENCOUNTER — Encounter (HOSPITAL_COMMUNITY): Payer: Self-pay

## 2024-11-19 ENCOUNTER — Other Ambulatory Visit: Payer: Self-pay

## 2024-11-19 ENCOUNTER — Emergency Department (HOSPITAL_COMMUNITY): Admission: EM | Admit: 2024-11-19 | Discharge: 2024-11-20 | Disposition: A | Discharge status: Home / Self Care

## 2024-11-19 DIAGNOSIS — F1510 Other stimulant abuse, uncomplicated: Secondary | ICD-10-CM

## 2024-11-19 DIAGNOSIS — F419 Anxiety disorder, unspecified: Secondary | ICD-10-CM | POA: Insufficient documentation

## 2024-11-19 DIAGNOSIS — R45851 Suicidal ideations: Secondary | ICD-10-CM | POA: Insufficient documentation

## 2024-11-19 DIAGNOSIS — I10 Essential (primary) hypertension: Secondary | ICD-10-CM | POA: Insufficient documentation

## 2024-11-19 DIAGNOSIS — E119 Type 2 diabetes mellitus without complications: Secondary | ICD-10-CM | POA: Insufficient documentation

## 2024-11-19 DIAGNOSIS — F319 Bipolar disorder, unspecified: Secondary | ICD-10-CM | POA: Insufficient documentation

## 2024-11-19 DIAGNOSIS — F15988 Other stimulant use, unspecified with other stimulant-induced disorder: Secondary | ICD-10-CM | POA: Insufficient documentation

## 2024-11-19 DIAGNOSIS — Z8616 Personal history of COVID-19: Secondary | ICD-10-CM | POA: Insufficient documentation

## 2024-11-19 DIAGNOSIS — F29 Unspecified psychosis not due to a substance or known physiological condition: Secondary | ICD-10-CM | POA: Insufficient documentation

## 2024-11-19 DIAGNOSIS — F1994 Other psychoactive substance use, unspecified with psychoactive substance-induced mood disorder: Secondary | ICD-10-CM | POA: Diagnosis present

## 2024-11-19 DIAGNOSIS — Y900 Blood alcohol level of less than 20 mg/100 ml: Secondary | ICD-10-CM | POA: Insufficient documentation

## 2024-11-19 DIAGNOSIS — F1910 Other psychoactive substance abuse, uncomplicated: Secondary | ICD-10-CM | POA: Insufficient documentation

## 2024-11-19 LAB — COMPREHENSIVE METABOLIC PANEL WITH GFR
ALT: 33 U/L (ref 0–44)
AST: 29 U/L (ref 15–41)
Albumin: 4.3 g/dL (ref 3.5–5.0)
Alkaline Phosphatase: 71 U/L (ref 38–126)
Anion gap: 11 (ref 5–15)
BUN: 25 mg/dL — ABNORMAL HIGH (ref 6–20)
CO2: 27 mmol/L (ref 22–32)
Calcium: 10 mg/dL (ref 8.9–10.3)
Chloride: 103 mmol/L (ref 98–111)
Creatinine, Ser: 0.83 mg/dL (ref 0.61–1.24)
GFR, Estimated: >60 mL/min
Glucose, Bld: 91 mg/dL (ref 70–99)
Potassium: 4.2 mmol/L (ref 3.5–5.1)
Sodium: 141 mmol/L (ref 135–145)
Total Bilirubin: 0.3 mg/dL (ref 0.0–1.2)
Total Protein: 7.6 g/dL (ref 6.5–8.1)

## 2024-11-19 LAB — CBC WITH DIFFERENTIAL/PLATELET
Abs Immature Granulocytes: 0.01 10*3/uL (ref 0.00–0.07)
Basophils Absolute: 0 10*3/uL (ref 0.0–0.1)
Basophils Relative: 1 %
Eosinophils Absolute: 0 10*3/uL (ref 0.0–0.5)
Eosinophils Relative: 1 %
HCT: 41.7 % (ref 39.0–52.0)
Hemoglobin: 13.1 g/dL (ref 13.0–17.0)
Immature Granulocytes: 0 %
Lymphocytes Relative: 21 %
Lymphs Abs: 1 10*3/uL (ref 0.7–4.0)
MCH: 29.8 pg (ref 26.0–34.0)
MCHC: 31.4 g/dL (ref 30.0–36.0)
MCV: 94.8 fL (ref 80.0–100.0)
Monocytes Absolute: 0.4 10*3/uL (ref 0.1–1.0)
Monocytes Relative: 9 %
Neutro Abs: 3.5 10*3/uL (ref 1.7–7.7)
Neutrophils Relative %: 68 %
Platelets: 199 10*3/uL (ref 150–400)
RBC: 4.4 MIL/uL (ref 4.22–5.81)
RDW: 13.5 % (ref 11.5–15.5)
WBC: 5.1 10*3/uL (ref 4.0–10.5)
nRBC: 0 % (ref 0.0–0.2)

## 2024-11-19 LAB — URINE DRUG SCREEN
Amphetamines: POSITIVE — AB
Barbiturates: NEGATIVE
Benzodiazepines: NEGATIVE
Cocaine: NEGATIVE
Fentanyl: NEGATIVE
Methadone Scn, Ur: NEGATIVE
Opiates: NEGATIVE
Tetrahydrocannabinol: NEGATIVE

## 2024-11-19 LAB — ETHANOL: Alcohol, Ethyl (B): <15 mg/dL

## 2024-11-19 MED ORDER — LORAZEPAM 1 MG PO TABS
2.0000 mg | ORAL_TABLET | Freq: Once | ORAL | Status: AC
  Administered 2024-11-19: 2 mg via ORAL
  Filled 2024-11-19: qty 2

## 2024-11-19 NOTE — ED Provider Notes (Signed)
" °  [name] EMERGENCY DEPARTMENT AT [facility] Provider Note   CSN: [id number] Arrival date & time: 11/19/24  1729     Patient presents with: Psychiatric Evaluation   Cory Floyd is a 49 y.o. male.   49 year old male with past medical history of methamphetamine abuse and schizophrenia presenting to the emergency department today with suicidal ideations.  The patient states that he was on a meth bender over the past few days.  He states that he has started having suicidal ideations.  He is very paranoid and states that he thinks that people are after him.  He states that he wants to kill himself first before they get him.  His plan currently is to overdose on street drugs.  He came here today for further evaluation regarding this.        Prior to Admission medications  Medication Sig Start Date End Date Taking? Authorizing Provider  OLANZapine  (ZYPREXA ) 10 MG tablet Take 1 tablet (10 mg total) by mouth at bedtime. 11/18/24   Lynnette Barter, MD    Allergies: Carrot [daucus carota], Penicillins, and Principen [ampicillin]    Review of Systems  Psychiatric/Behavioral:  Positive for suicidal ideas.   All other systems reviewed and are negative.   Updated Vital Signs BP 129/86 (BP Location: Right Arm)   Pulse 93   Temp 98.2 F (36.8 C) (Oral)   Ht 5' 10 (1.778 m)   Wt 88.5 kg   SpO2 99%   BMI 27.99 kg/m   Physical Exam Vitals and nursing note reviewed.   Gen: NAD, disheveled Eyes: PERRL, EOMI HEENT: no oropharyngeal swelling Neck: trachea midline Resp: clear to auscultation bilaterally Card: RRR, no murmurs, rubs, or gallops Abd: nontender, nondistended Extremities: no calf tenderness, no edema Vascular: 2+ radial pulses bilaterally, 2+ DP pulses bilaterally Skin: no rashes Psyc: The patient is very anxious and is scratching at his hands bilaterally during interview.  Is tangential but easily redirectable   (all labs ordered are listed, but only  abnormal results are displayed) Labs Reviewed  COMPREHENSIVE METABOLIC PANEL WITH GFR - Abnormal; Notable for the following components:      Result Value   BUN 25 (*)    All other components within normal limits  ETHANOL  CBC WITH DIFFERENTIAL/PLATELET  URINE DRUG SCREEN    EKG: None  Radiology: No results found.   Procedures   Medications Ordered in the ED  LORazepam  (ATIVAN ) tablet 2 mg (2 mg Oral Given 11/19/24 1918)                                    Medical Decision Making 49 year old male with past medical history of methamphetamine abuse presenting to the emergency department today with suicidal ideation.  I will initiate a medical workup on the patient here.  The patient is here voluntarily and is asking for treatment at this time.  Plan is for psychiatric evaluation after medical clearance.  The patient's labs here are largely unremarkable.  He is medically cleared for psychiatric evaluation given his suicidal ideations.  Amount and/or Complexity of Data Reviewed Labs: ordered.  Risk Prescription drug management.        Final diagnoses:  Methamphetamine abuse [facility])  Suicidal ideation    ED Discharge Orders     None          Cory Floyd SAUNDERS, MD 11/19/24 2105  "

## 2024-11-19 NOTE — ED Notes (Signed)
 Pt belongings labeled and placed in [place] C cabinet , 2 bags , phone and glasses placed in bio-bag inside patient belongings bag

## 2024-11-19 NOTE — ED Triage Notes (Signed)
 Pt bib EMS stating needing a psych evaluation and states he just finished a meth binder also stating SI

## 2024-11-19 NOTE — ED Notes (Signed)
Pt. Unable to give urine at this time. 

## 2024-11-19 NOTE — ED Notes (Signed)
 First poc with patient. Pt lying in stretcher. No respiratory distress noted. Pt updated on plan of care.  Pt requested sandwich, pt notified that due to just consuming x2 sandwiches, will have to hold off on that request.  Pt given cheese and crackers with cranberry juice instead.  Pt verbalized understanding.  VS obtained and recorded. During initial RN assessment, pt admitted to using meth constantly for days, but denies use today.  PT states he is indeed SI and HI and states I'll kill them before they kill me!  When asked who are they, pt described, the ones who started this.  When asked if patient has SI plan, pt stated yeah, figured I'd try fentanyl  overdose, haven't tried that before, was here a couple weeks ago cause they tried  Pt cooperative and calm, but easily irritable.  Admits to auditory and visual hallucinations, but states I know what's real, some of it's real

## 2024-11-19 NOTE — BH Assessment (Signed)
 Patient was deferred to [facility] for a telepsych assessment. The assigned care coordinator will provide updates regarding the scheduling of the assessment. IRIS coordinator can be reached at [phone] for further information on the timing of the telepsych evaluation.

## 2024-11-20 NOTE — ED Provider Notes (Addendum)
 Emergency Medicine Observation Re-evaluation Note  Cory Floyd is a 49 y.o. male, seen on rounds today.  Pt initially presented to the ED for complaints of Psychiatric Evaluation Currently, the patient is awaiting TTS evaluation.  Physical Exam  BP 126/79   Pulse 62   Temp 98.6 F (37 C) (Oral)   Resp 18   Ht 5' 10 (1.778 m)   Wt 88.5 kg   SpO2 100%   BMI 27.99 kg/m  Physical Exam Resting in no acute distress  ED Course / MDM  EKG:   I have reviewed the labs performed to date as well as medications administered while in observation.  Recent changes in the last 24 hours include none.  Plan  Current plan is for TTS evaluation.    Suzette Pac, MD 11/20/24 9072    Suzette Pac, MD 11/20/24 [phone]

## 2024-11-20 NOTE — ED Notes (Signed)
 [facility] provided pt with resources for outpatient substance use treatment, behavioral health, homeless services/shelter, and free meals.   Chesley Holt, [facility]  11/20/24

## 2024-11-20 NOTE — ED Notes (Addendum)
 Pt friend Deward Molt called to speak to pt. [phone]. Will call back when pt awakes

## 2024-11-20 NOTE — Consult Note (Signed)
 [facility] Psychiatric Consult Initial  Patient Name: .Cory Floyd  MRN: [id number]  DOB: 1976-09-24  Consult Order details:  Orders (From admission, onward)     Start     Ordered   11/19/24 2105  CONSULT TO CALL ACT TEAM       Ordering Provider: Ula Prentice SAUNDERS, MD  Provider:  (Not yet assigned)  Question:  Reason for Consult?  Answer:  Psych consult   11/19/24 2104             Mode of Visit: In person    Psychiatry Consult Evaluation  Service Date: November 20, 2024 LOS:  LOS: 0 days  Chief Complaint stimulant use disorder, paranoia  Primary Psychiatric Diagnoses  Methamphetamine use disorder 2.  Psychoactive substance induced psychosis 3.  Bipolar 1 disorder  Assessment  Cory Floyd is a 49 y.o. male admitted: Presented to the ED for 11/19/2024  5:30 PM for substance-induced psychosis, paranoia. He carries the psychiatric diagnoses of methamphetamine use disorder, psychoactive substance induced psychosis, bipolar 1 disorder and has a past medical history of arthritis, cellulitis, malingering.   His current presentation of anxiety is most consistent with methamphetamine use disorder. He meets criteria for discharge with outpatient mental health and substance use treatment resources based on current presentation.  Current outpatient psychotropic medications include olanzapine  10 mg nightly/mood. He was not compliant with medications prior to admission as evidenced by patient report. On initial examination, patient is alert and oriented, cooperative with assessment.  He is alert. Please see plan below for detailed recommendations.   Diagnoses:  Active Hospital problems: Active Problems:   Substance induced mood disorder (HCC)    Plan   ## Psychiatric Medication Recommendations:  Restart olanzapine  10 mg nightly/mood  ## Medical Decision Making Capacity: Not specifically addressed in this encounter  ## Further Work-up:  UDS 11/19/2024: +THC -- most recent EKG on  11/19/2024 NSR had QtC of -- Pertinent labwork reviewed.   ## Disposition:-- There are no psychiatric contraindications to discharge at this time  ## Behavioral / Environmental: - No specific recommendations at this time.     ## Safety and Observation Level:  - Based on my clinical evaluation, I estimate the patient to be at minimal risk of self harm in the current setting. - At this time, we recommend  routine. This decision is based on my review of the chart including patient's history and current presentation, interview of the patient, mental status examination, and consideration of suicide risk including evaluating suicidal ideation, plan, intent, suicidal or self-harm behaviors, risk factors, and protective factors. This judgment is based on our ability to directly address suicide risk, implement suicide prevention strategies, and develop a safety plan while the patient is in the clinical setting. Please contact our team if there is a concern that risk level has changed.  CSSR Risk Category:C-SSRS RISK CATEGORY: High Risk  Suicide Risk Assessment: Patient has following modifiable risk factors for suicide: medication noncompliance, which we are addressing by outpatient mental health and substance use tx. Patient has following non-modifiable or demographic risk factors for suicide: male gender and psychiatric hospitalization Patient has the following protective factors against suicide: Access to outpatient mental health care, Supportive friends, and no history of NSSIB  Thank you for this consult request. Recommendations have been communicated to the primary team.  We will recommend discharge at this time.   Cory LITTIE Dawn, FNP       History of Present Illness  Relevant Aspects  of Hospital ED Course:  Admitted on 11/19/2024 for notation just finished a meth bender also stating SI.   Patient Report:  Chart reviewed and patient discussed with attending psychiatrist, Dr. Porfirio Glatter, on 11/20/2024. Patient is reassessed by this nurse practitioner face-to-face.  Patient is seated, denies physical complaint.  He is alert and oriented, cooperative during assessment.  Patient presents with anxious mood, congruent affect.  Cory Floyd is a 49 year old male with history to include bipolar 1 disorder, methamphetamine use disorder, substance-induced psychosis, substance-induced mood disorder.  Patient presented voluntarily to [facility] emergency department on 11/19/2024.  Patient endorsing SI and paranoia upon arrival.  Patient is insightful today.  Patient states when I stay with my friends I use methamphetamine with them and then I am paranoid.  [name] plans to resume residing at homeless shelter until more permanent housing can be found.  Patient denies SI/HI/AVH.  There is no evidence of delusional thought content and no indication that patient is responding to internal stimuli.  Cory Floyd plans to follow-up with [facility] behavioral health outpatient for medication management and individual counseling.  [name] has a list of substance use treatment resources and is considering returning to residential substance use treatment.  Patient offered support and encouragement.  Patient educated and verbalizes understanding of mental health resources and other crisis services in the community. They are instructed to call 911 and present to the nearest emergency room should patient experience any suicidal/homicidal ideation, auditory/visual/hallucinations, or detrimental worsening of mental health condition.     Psych ROS:  Depression: Denies Anxiety: Current Mania (lifetime and current): History Psychosis: (lifetime and current): History  Collateral information:  Patient declines any person to contact for collateral information at this time.  Review of Systems  Constitutional: Negative.   HENT: Negative.    Eyes: Negative.   Respiratory: Negative.    Cardiovascular:  Negative.   Gastrointestinal: Negative.   Genitourinary: Negative.   Musculoskeletal: Negative.   Skin: Negative.   Neurological: Negative.   Psychiatric/Behavioral:  Positive for substance abuse. The patient is nervous/anxious.      Psychiatric and Social History  Psychiatric History:  Information collected from patient, medical record, attending RN  Prev Dx/Sx: SI Current Psych Provider: Patient plans to follow-up with [facility] behavioral health outpatient Home Meds (current): Rx olanzapine  10 mg nightly/reviewed, patient reports not taking prior to arrival Therapy: Patient verbalized plan to initiate individual counseling with outpatient at [facility] behavioral health  Prior Psych Hospitalization: Multiple Prior Self Harm: Denies NSSHB Prior Violence: Denies  Family Psych History: None reported Family Hx suicide: None reported   Social History:  Occupational Hx: Unemployed Legal Hx: None reported Living Situation: Most recently residing with friends plans to return to homeless shelter Access to weapons/lethal means: Denies  Substance History Alcohol: Denies Tobacco: Patient endorses intermittent tobacco/nicotine  use Illicit drugs: Methamphetamine, average 1 time per week. Prescription drug abuse: Denies Rehab hx: Reports multiple residential substance use treatment admissions.  Exam Findings  Physical Exam:  Vital Signs:  Temp:  [98.2 F (36.8 C)-98.6 F (37 C)] 98.6 F (37 C) (01/29 0705) Pulse Rate:  [62-93] 62 (01/29 0705) Resp:  [16-18] 18 (01/29 0705) BP: (126-129)/(79-86) 126/79 (01/29 0705) SpO2:  [99 %-100 %] 100 % (01/29 0705) Weight:  [88.5 kg] 88.5 kg (01/28 1958) Blood pressure 126/79, pulse 62, temperature 98.6 F (37 C), temperature source Oral, resp. rate 18, height 5' 10 (1.778 m), weight 88.5 kg, SpO2 100%. Body mass index is 27.99 kg/m.  Physical Exam Vitals and nursing note reviewed.  Constitutional:      Appearance: Normal  appearance. He is normal weight.  HENT:     Head: Normocephalic and atraumatic.     Nose: Nose normal.  Cardiovascular:     Rate and Rhythm: Normal rate.  Pulmonary:     Effort: Pulmonary effort is normal.  Musculoskeletal:        General: Normal range of motion.     Cervical back: Normal range of motion.  Skin:    General: Skin is warm and dry.  Neurological:     Mental Status: He is alert and oriented to person, place, and time.  Psychiatric:        Attention and Perception: Attention and perception normal.        Mood and Affect: Affect normal. Mood is anxious.        Speech: Speech normal.        Behavior: Behavior normal. Behavior is cooperative.        Thought Content: Thought content normal.        Cognition and Memory: Cognition and memory normal.        Judgment: Judgment normal.     Mental Status Exam: General Appearance: Fairly Groomed  Orientation:  Full (Time, Place, and Person)  Memory:  Immediate;   Good Recent;   Fair  Concentration:  Concentration: Good and Attention Span: Good  Recall:  Good  Attention  Good  Eye Contact:  Fair  Speech:  Clear and Coherent and Normal Rate  Language:  Good  Volume:  Normal  Mood: Anxious  Affect:  Congruent  Thought Process:  Coherent, Goal Directed, and Linear  Thought Content:  WDL and Logical  Suicidal Thoughts:  No  Homicidal Thoughts:  No  Judgement:  Fair  Insight:  Fair  Psychomotor Activity:  Normal  Akathisia:  No  Fund of Knowledge:  Good      Assets:  Communication Skills Desire for Improvement Leisure Time Physical Health Resilience Social Support  Cognition:  WNL  ADL's:  Intact  AIMS (if indicated):        Other History   These have been pulled in through the EMR, reviewed, and updated if appropriate.  Family History:  The patient's family history includes Cancer in his maternal aunt and mother; Hepatitis C in his mother; Hypertension in his maternal grandmother.  Medical History: Past  Medical History:  Diagnosis Date   Alcoholic (HCC)    clean for 3 years   Anginal pain    Arthritis    Bipolar 1 disorder (HCC)    COVID-19    Depressed    Diabetes mellitus without complication (HCC)    Dyspnea    Fatty liver    Hypertension    Schizophrenia (HCC)     Surgical History: Past Surgical History:  Procedure Laterality Date   COLONOSCOPY  02/05/2014   diverticulosis, hyperplastic polyp x 2   fracture arm Right    INCISION AND DRAINAGE ABSCESS Bilateral 12/29/2022   Procedure: INCISION AND DRAINAGE ABSCESS BILATERAL ARMS;  Surgeon: Kendal Franky SQUIBB, MD;  Location: [facility] OR;  Service: Orthopedics;  Laterality: Bilateral;   KNEE SURGERY Right 2013   NASAL SEPTUM SURGERY     NASAL TURBINATE REDUCTION Bilateral 12/12/2016   Procedure: TURBINATE REDUCTION/SUBMUCOSAL RESECTION;  Surgeon: Chinita Hasten, MD;  Location: [facility] ORS;  Service: ENT;  Laterality: Bilateral;     Medications:  Current Medications[1]  Allergies: Allergies[2]  Cory LITTIE Dawn,  FNP     [1] No current facility-administered medications for this encounter.  Current Outpatient Medications:    OLANZapine  (ZYPREXA ) 10 MG tablet, Take 1 tablet (10 mg total) by mouth at bedtime. (Patient not taking: Reported on 11/20/2024), Disp: 30 tablet, Rfl: 0 [2]  Allergies Allergen Reactions   Carrot [Daucus Carota] Anaphylaxis and Rash   Penicillins Anaphylaxis, Hives and Other (See Comments)    All cillins   Principen [Ampicillin] Anaphylaxis and Hives

## 2024-11-20 NOTE — BH Assessment (Addendum)
 0208 Per Julianna, RN, pt was asleep, unable to arouse.   [id number] TTS Clinician reached out to see if patient was alert and oriented to complete TTS Consult.   Pt will be seen by dayshift provider for TTS Consult.

## 2024-11-20 NOTE — Discharge Instructions (Signed)
Follow-up as instructed by behavioral health 

## 2024-11-21 ENCOUNTER — Ambulatory Visit (HOSPITAL_COMMUNITY)
Admission: EM | Admit: 2024-11-21 | Discharge: 2024-11-22 | Disposition: A | Attending: Psychiatry | Admitting: Psychiatry

## 2024-11-21 ENCOUNTER — Other Ambulatory Visit: Payer: Self-pay

## 2024-11-21 DIAGNOSIS — Z9151 Personal history of suicidal behavior: Secondary | ICD-10-CM | POA: Insufficient documentation

## 2024-11-21 DIAGNOSIS — F19951 Other psychoactive substance use, unspecified with psychoactive substance-induced psychotic disorder with hallucinations: Secondary | ICD-10-CM | POA: Diagnosis not present

## 2024-11-21 DIAGNOSIS — F1590 Other stimulant use, unspecified, uncomplicated: Secondary | ICD-10-CM

## 2024-11-21 DIAGNOSIS — F319 Bipolar disorder, unspecified: Secondary | ICD-10-CM | POA: Insufficient documentation

## 2024-11-21 DIAGNOSIS — Z5901 Sheltered homelessness: Secondary | ICD-10-CM | POA: Insufficient documentation

## 2024-11-21 DIAGNOSIS — R45851 Suicidal ideations: Secondary | ICD-10-CM | POA: Diagnosis present

## 2024-11-21 DIAGNOSIS — Z765 Malingerer [conscious simulation]: Secondary | ICD-10-CM | POA: Insufficient documentation

## 2024-11-21 DIAGNOSIS — Z8616 Personal history of COVID-19: Secondary | ICD-10-CM | POA: Insufficient documentation

## 2024-11-21 DIAGNOSIS — Z560 Unemployment, unspecified: Secondary | ICD-10-CM | POA: Diagnosis not present

## 2024-11-21 LAB — CBC WITH DIFFERENTIAL/PLATELET
Abs Immature Granulocytes: 0.01 10*3/uL (ref 0.00–0.07)
Basophils Absolute: 0 10*3/uL (ref 0.0–0.1)
Basophils Relative: 1 %
Eosinophils Absolute: 0.1 10*3/uL (ref 0.0–0.5)
Eosinophils Relative: 1 %
HCT: 40.1 % (ref 39.0–52.0)
Hemoglobin: 13.2 g/dL (ref 13.0–17.0)
Immature Granulocytes: 0 %
Lymphocytes Relative: 25 %
Lymphs Abs: 1.2 10*3/uL (ref 0.7–4.0)
MCH: 30.6 pg (ref 26.0–34.0)
MCHC: 32.9 g/dL (ref 30.0–36.0)
MCV: 93 fL (ref 80.0–100.0)
Monocytes Absolute: 0.5 10*3/uL (ref 0.1–1.0)
Monocytes Relative: 10 %
Neutro Abs: 3.1 10*3/uL (ref 1.7–7.7)
Neutrophils Relative %: 63 %
Platelets: 219 10*3/uL (ref 150–400)
RBC: 4.31 MIL/uL (ref 4.22–5.81)
RDW: 13.3 % (ref 11.5–15.5)
WBC: 4.8 10*3/uL (ref 4.0–10.5)
nRBC: 0 % (ref 0.0–0.2)

## 2024-11-21 LAB — POCT URINE DRUG SCREEN - MANUAL ENTRY (I-SCREEN)
POC Amphetamine UR: POSITIVE — AB
POC Buprenorphine (BUP): NOT DETECTED
POC Cocaine UR: NOT DETECTED
POC Marijuana UR: NOT DETECTED
POC Methadone UR: NOT DETECTED
POC Methamphetamine UR: POSITIVE — AB
POC Morphine: NOT DETECTED
POC Oxazepam (BZO): NOT DETECTED
POC Oxycodone UR: NOT DETECTED
POC Secobarbital (BAR): NOT DETECTED

## 2024-11-21 LAB — COMPREHENSIVE METABOLIC PANEL WITH GFR
ALT: 34 U/L (ref 0–44)
AST: 28 U/L (ref 15–41)
Albumin: 4.1 g/dL (ref 3.5–5.0)
Alkaline Phosphatase: 70 U/L (ref 38–126)
Anion gap: 10 (ref 5–15)
BUN: 24 mg/dL — ABNORMAL HIGH (ref 6–20)
CO2: 27 mmol/L (ref 22–32)
Calcium: 9.2 mg/dL (ref 8.9–10.3)
Chloride: 103 mmol/L (ref 98–111)
Creatinine, Ser: 0.79 mg/dL (ref 0.61–1.24)
GFR, Estimated: >60 mL/min
Glucose, Bld: 104 mg/dL — ABNORMAL HIGH (ref 70–99)
Potassium: 4.3 mmol/L (ref 3.5–5.1)
Sodium: 140 mmol/L (ref 135–145)
Total Bilirubin: <0.2 mg/dL (ref 0.0–1.2)
Total Protein: 7.3 g/dL (ref 6.5–8.1)

## 2024-11-21 LAB — ETHANOL: Alcohol, Ethyl (B): <15 mg/dL

## 2024-11-21 MED ORDER — HALOPERIDOL LACTATE 5 MG/ML IJ SOLN: 10.0000 mg | Freq: Three times a day (TID) | INTRAMUSCULAR | Status: DC | PRN

## 2024-11-21 MED ORDER — DIPHENHYDRAMINE HCL 50 MG/ML IJ SOLN: 50.0000 mg | Freq: Three times a day (TID) | INTRAMUSCULAR | Status: DC | PRN

## 2024-11-21 MED ORDER — LORAZEPAM 2 MG/ML IJ SOLN: 2.0000 mg | Freq: Three times a day (TID) | INTRAMUSCULAR | Status: DC | PRN

## 2024-11-21 MED ORDER — ALUM & MAG HYDROXIDE-SIMETH 200-200-20 MG/5ML PO SUSP: 30.0000 mL | ORAL | Status: DC | PRN

## 2024-11-21 MED ORDER — OLANZAPINE 10 MG PO TABS
10.0000 mg | ORAL_TABLET | Freq: Every day | ORAL | Status: DC
  Administered 2024-11-21: 10 mg via ORAL
  Filled 2024-11-21: qty 1

## 2024-11-21 MED ORDER — HALOPERIDOL LACTATE 5 MG/ML IJ SOLN: 5.0000 mg | Freq: Three times a day (TID) | INTRAMUSCULAR | Status: DC | PRN

## 2024-11-21 MED ORDER — DIPHENHYDRAMINE HCL 50 MG PO CAPS: 50.0000 mg | ORAL_CAPSULE | Freq: Three times a day (TID) | ORAL | Status: DC | PRN

## 2024-11-21 MED ORDER — HYDROXYZINE HCL 25 MG PO TABS
25.0000 mg | ORAL_TABLET | Freq: Three times a day (TID) | ORAL | Status: DC | PRN
  Administered 2024-11-21: 25 mg via ORAL
  Filled 2024-11-21: qty 1

## 2024-11-21 MED ORDER — MAGNESIUM HYDROXIDE 400 MG/5ML PO SUSP: 30.0000 mL | Freq: Every day | ORAL | Status: DC | PRN

## 2024-11-21 MED ORDER — HALOPERIDOL 5 MG PO TABS: 5.0000 mg | ORAL_TABLET | Freq: Three times a day (TID) | ORAL | Status: DC | PRN

## 2024-11-21 MED ORDER — ACETAMINOPHEN 325 MG PO TABS
650.0000 mg | ORAL_TABLET | Freq: Four times a day (QID) | ORAL | Status: DC | PRN
  Administered 2024-11-21: 650 mg via ORAL
  Filled 2024-11-21: qty 2

## 2024-11-21 MED ORDER — NICOTINE 21 MG/24HR TD PT24
21.0000 mg | MEDICATED_PATCH | Freq: Every day | TRANSDERMAL | Status: DC
  Administered 2024-11-22: 21 mg via TRANSDERMAL
  Filled 2024-11-21: qty 1
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
## 2024-11-21 NOTE — ED Notes (Addendum)
 No documentation on this patient .SABRA Not assigned to this clinical research associate.

## 2024-11-21 NOTE — ED Notes (Signed)
 Pt sleeping in bed, no acute distress noted. Respirations even and unlabored. Continue to monitor for safety.

## 2024-11-21 NOTE — Progress Notes (Signed)
" °   11/21/24 1405  [place] Suicide Severity Rating Scale  1. In the past month -  Have you wished you were dead or wished you could go to sleep and not wake up? Yes  2. In the past month - Have you actually had any thoughts of killing yourself? Yes  3. In the past month - Have you been thinking about how you might kill yourself? No  4. In the past month - Have you had these thoughts and had some intention of acting on them? Yes  5. In the past month - Have you started to work out or worked out the details of how to kill yourself? Do you intend to carry out this plan? No  6. Have you ever done anything, started to do anything, or prepared to do anything to end your life? Yes  7. Was this within the past three months? No  C-SSRS RISK CATEGORY High Risk  BH Urgent Care/Facility Based Crisis Center Suicide Precautions Interventions  [facility] Suicide Precautions Interventions High Risk Interventions    "

## 2024-11-21 NOTE — ED Notes (Signed)
 Pt denies SI/HI/VH. He endorses AH. Generalized pain managed with prn tylenol . Compliant with scheduled meds; prn atarax  given. He was anxious, cooperative, and irritable. No behavioral concerns at this time. Pt sleeping in bed, no acute distress noted. Respirations even and unlabored. Continue to monitor for safety.
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
## 2024-11-22 ENCOUNTER — Other Ambulatory Visit (HOSPITAL_COMMUNITY)
Admission: EM | Admit: 2024-11-22 | Discharge: 2024-11-26 | Disposition: A | Source: Home / Self Care | Attending: Psychiatry | Admitting: Psychiatry

## 2024-11-22 DIAGNOSIS — F19951 Other psychoactive substance use, unspecified with psychoactive substance-induced psychotic disorder with hallucinations: Secondary | ICD-10-CM

## 2024-11-22 DIAGNOSIS — F1510 Other stimulant abuse, uncomplicated: Secondary | ICD-10-CM

## 2024-11-22 DIAGNOSIS — F1994 Other psychoactive substance use, unspecified with psychoactive substance-induced mood disorder: Secondary | ICD-10-CM | POA: Diagnosis present

## 2024-11-22 DIAGNOSIS — F19959 Other psychoactive substance use, unspecified with psychoactive substance-induced psychotic disorder, unspecified: Secondary | ICD-10-CM

## 2024-11-22 DIAGNOSIS — R45851 Suicidal ideations: Secondary | ICD-10-CM | POA: Diagnosis not present

## 2024-11-22 DIAGNOSIS — F1590 Other stimulant use, unspecified, uncomplicated: Secondary | ICD-10-CM

## 2024-11-22 MED ORDER — DIPHENHYDRAMINE HCL 50 MG PO CAPS
50.0000 mg | ORAL_CAPSULE | Freq: Three times a day (TID) | ORAL | Status: DC | PRN
  Administered 2024-11-24: 50 mg via ORAL
  Filled 2024-11-22: qty 1

## 2024-11-22 MED ORDER — MAGNESIUM HYDROXIDE 400 MG/5ML PO SUSP: 30.0000 mL | Freq: Every day | ORAL | Status: DC | PRN

## 2024-11-22 MED ORDER — LORAZEPAM 2 MG/ML IJ SOLN: 2.0000 mg | Freq: Three times a day (TID) | INTRAMUSCULAR | Status: DC | PRN

## 2024-11-22 MED ORDER — HALOPERIDOL LACTATE 5 MG/ML IJ SOLN: 10.0000 mg | Freq: Three times a day (TID) | INTRAMUSCULAR | Status: DC | PRN

## 2024-11-22 MED ORDER — NICOTINE 21 MG/24HR TD PT24
21.0000 mg | MEDICATED_PATCH | Freq: Every day | TRANSDERMAL | Status: DC
  Filled 2024-11-22 (×2): qty 1

## 2024-11-22 MED ORDER — DIPHENHYDRAMINE HCL 50 MG/ML IJ SOLN: 50.0000 mg | Freq: Three times a day (TID) | INTRAMUSCULAR | Status: DC | PRN

## 2024-11-22 MED ORDER — HALOPERIDOL 5 MG PO TABS
5.0000 mg | ORAL_TABLET | Freq: Three times a day (TID) | ORAL | Status: DC | PRN
  Administered 2024-11-24: 5 mg via ORAL
  Filled 2024-11-22: qty 1

## 2024-11-22 MED ORDER — ALUM & MAG HYDROXIDE-SIMETH 200-200-20 MG/5ML PO SUSP: 30.0000 mL | ORAL | Status: DC | PRN

## 2024-11-22 MED ORDER — ACETAMINOPHEN 325 MG PO TABS: 650.0000 mg | ORAL_TABLET | Freq: Four times a day (QID) | ORAL | Status: DC | PRN

## 2024-11-22 MED ORDER — HYDROXYZINE HCL 25 MG PO TABS
25.0000 mg | ORAL_TABLET | Freq: Three times a day (TID) | ORAL | Status: DC | PRN
  Administered 2024-11-22 – 2024-11-26 (×4): 25 mg via ORAL
  Filled 2024-11-22 (×5): qty 1

## 2024-11-22 MED ORDER — HALOPERIDOL LACTATE 5 MG/ML IJ SOLN: 5.0000 mg | Freq: Three times a day (TID) | INTRAMUSCULAR | Status: DC | PRN

## 2024-11-22 MED ORDER — OLANZAPINE 10 MG PO TABS
10.0000 mg | ORAL_TABLET | Freq: Every day | ORAL | Status: DC
  Administered 2024-11-22: 10 mg via ORAL
  Filled 2024-11-22: qty 1

## 2024-11-22 NOTE — Group Note (Signed)
 Group Topic: Emotional Regulation  Group Date: 11/22/2024 Start Time: 8069 End Time: 2000 Facilitators: Verdon Jacqualyn BRAVO, NT  Department: [facility]  Number of Participants: 7  Group Focus: clarity of thought Treatment Modality:  Individual Therapy Interventions utilized were group exercise Purpose: express feelings  Name: Cory Floyd Date of Birth: [birth year]  MR: [id number]    Level of Participation: did not participate Quality of Participation:n/a Interactions with others: none Mood/Affect: appropriate Triggers (if applicable): n/a Cognition: n/a Progress: None Response: n/a Plan: patient will be encouraged to attend group  Patients Problems:  Patient Active Problem List   Diagnosis Date Noted   Moderate stimulant use disorder (HCC) 11/17/2024   Substance-induced psychotic disorder (HCC) 11/17/2024   Methamphetamine use disorder, severe (HCC) 09/06/2024   Bipolar affective disorder, current episode manic (HCC) 09/05/2024   Malingering 04/02/2024   Rhabdomyolysis 01/25/2023   Dehydration 01/25/2023   Alcohol use disorder 01/25/2023   AKI (acute kidney injury) 12/27/2022   Visual hallucinations 12/20/2022   Cellulitis 12/19/2022   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   Bipolar disorder with severe depression (HCC) 05/02/2021   Bipolar 1 disorder, depressed, severe (HCC) 04/02/2021   Cocaine abuse with cocaine-induced mood disorder (HCC) 04/02/2021   Bipolar disorder (HCC) 01/24/2021   Substance induced mood disorder (HCC) 07/26/2020   Suicidal ideation    Polysubstance dependence (HCC) 07/25/2020   Bipolar affective disorder, depressed, moderate (HCC) 03/06/2020   Psychoactive substance-induced psychosis (HCC) 10/20/2019   Bipolar 1 disorder (HCC) 09/30/2019   Arthritis 09/30/2019   Gastroesophageal reflux disease without esophagitis 09/21/2016   Obstructive sleep apnea on CPAP 09/21/2016   Morbid obesity with BMI of 40.0-44.9,  adult (HCC) 08/15/2016   Stable angina pectoris 08/15/2016

## 2024-11-22 NOTE — Discharge Instructions (Signed)
Pt transferred to [facility]. 

## 2024-11-22 NOTE — ED Notes (Signed)
 Pt sleeping in bed, no acute distress noted. Respirations even and unlabored. Continue to monitor for safety.

## 2024-11-22 NOTE — ED Notes (Signed)
 Pt admitted to [facility] alert and oriented x4. Calm, cooperative throughout interview process. Skin assessment completed. Oriented to unit. Meal and drink offered. Pt verbally contract for safety. Will monitor for safety.

## 2024-11-22 NOTE — ED Provider Notes (Signed)
 FBC/OBS ASAP Discharge Summary  Date and Time: 11/22/2024 10:58 AM  Name: Cory Floyd  MRN:  [id number]   Discharge Diagnoses:  Final diagnoses:  Suicidal ideations  Substance-induced psychotic disorder with hallucinations (HCC)  Stimulant use disorder    Subjective: Cory Floyd, 49 y.o., male  presented to [facility] voluntarily as a walk in, accompanied by GPD, with complaints of passive suicidal ideations and visual/auditory hallucinations. Patient seen face to face by this provider, and chart reviewed on 11/21/24.  Per chart review patient has a past psychiatric history of bipolar disorder, substance-induced psychosis, substance-induced mood disorder and polysubstance use including history of methamphetamine, cocaine and alcohol abuse. Last inpatient hospitalization was at [facility] in November 2025. Medical hx of GERD, rhabdomyolysis, and cellulitis.   Upon assessment today, pt is observed lying in recliner, in no acute distress. He reports poor sleep last night,  I was up and down all night. He reports having a good appetite. He continues to endorse passive suicidal ideations without plan and is tearful during assessment. He also endorses ongoing auditory hallucinations that are telling him they are going to kill him. He denies active visual hallucinations and homicidal ideations. Pt was compliant with Olanzapine  10mg  last night and denies having any issues or side effects. He was also given PRN Hydroxyzine  for anxiety last night. Pt is hoping to receive further treatment, as he does not feel safe discharging home at this time. He seems to be in the contemplative stage at this time as he is open to and interested in learning about the possible substance abuse treatment options that are available. We discussed recommendation for transfer to [facility] for ongoing mental health and substance abuse treatment. He is educated on the unit and the expectation and agrees to  transfer to [facility].   Stay Summary: Pt was admitted to [facility] observation on 11/21/24 with a presentation of suicidal ideations and auditory/ visual hallucinations. Initial treatment plan was to restart psychotropic medication including Olanzapine  10mg  at bedtime, allow time for methamphetamine metabolism  and discharge in the morning if mood had improved. Pt was compliant with medications and denies concerns or adverse effects. Upon reassessment, pt continues to be tearful, endorsing passive SI and ongoing AH, unable to sleep last night likely due to recent methamphetamine use. Pt was therefore recommended for treatment in the Facility Based Crisis Unit.   Total Time spent with patient: 30 minutes  Past Psychiatric History:  PPHx of bipolar disorder, substance induced psychosis and substance induced mood disorder. No current outpatient mental health treatment. Last inpatient admission was in November 2025 at [facility]. Substance abuse including current methamphetamine use and hx of cocaine and alcohol use.  Past Medical History:  Past Medical History:  Diagnosis Date   Alcoholic (HCC)    clean for 3 years   Anginal pain    Arthritis    Bipolar 1 disorder (HCC)    COVID-19    Depressed    Diabetes mellitus without complication (HCC)    Dyspnea    Fatty liver    Hypertension    Schizophrenia [facility])     Patient Active Problem List   Diagnosis Date Noted   Moderate stimulant use disorder (HCC) 11/17/2024   Substance-induced psychotic disorder (HCC) 11/17/2024   Methamphetamine use disorder, severe (HCC) 09/06/2024   Bipolar affective disorder, current episode manic (HCC) 09/05/2024   Malingering 04/02/2024   Rhabdomyolysis 01/25/2023   Dehydration 01/25/2023   Alcohol use disorder 01/25/2023  AKI (acute kidney injury) 12/27/2022   Visual hallucinations 12/20/2022   Cellulitis 12/19/2022   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   Bipolar disorder with severe depression (HCC) 05/02/2021   Bipolar 1  disorder, depressed, severe (HCC) 04/02/2021   Cocaine abuse with cocaine-induced mood disorder (HCC) 04/02/2021   Bipolar disorder (HCC) 01/24/2021   Substance induced mood disorder (HCC) 07/26/2020   Suicidal ideation    Polysubstance dependence (HCC) 07/25/2020   Bipolar affective disorder, depressed, moderate (HCC) 03/06/2020   Psychoactive substance-induced psychosis (HCC) 10/20/2019   Bipolar 1 disorder (HCC) 09/30/2019   Arthritis 09/30/2019   Gastroesophageal reflux disease without esophagitis 09/21/2016   Obstructive sleep apnea on CPAP 09/21/2016   Morbid obesity with BMI of 40.0-44.9, adult (HCC) 08/15/2016   Stable angina pectoris 08/15/2016    Family History:  Family History  Problem Relation Age of Onset   Hepatitis C Mother    Cancer Mother    Cancer Maternal Aunt    Hypertension Maternal Grandmother     Social History: Pt currently unemployed and receiving disability income, which was recently decreased because he did not re-apply. Currently homeless, living in shelter or with friends. UDS positive for methamphetamine and amphetamine.  Tobacco Cessation:  A prescription for an FDA-approved tobacco cessation medication provided at discharge  Current Medications:  Current Facility-Administered Medications  Medication Dose Route Frequency Provider Last Rate Last Admin   acetaminophen  (TYLENOL ) tablet 650 mg  650 mg Oral Q6H PRN Dorcus Riga C, NP   650 mg at 11/21/24 2113   alum & mag hydroxide-simeth (MAALOX/MYLANTA) 200-200-20 MG/5ML suspension 30 mL  30 mL Oral Q4H PRN Monserath Neff C, NP       haloperidol  (HALDOL ) tablet 5 mg  5 mg Oral TID PRN Michayla Mcneil C, NP       And   diphenhydrAMINE  (BENADRYL ) capsule 50 mg  50 mg Oral TID PRN Dameon Soltis C, NP       haloperidol  lactate (HALDOL ) injection 5 mg  5 mg Intramuscular TID PRN Hallis Meditz C, NP       And   diphenhydrAMINE  (BENADRYL ) injection 50 mg  50 mg Intramuscular TID PRN Raife Lizer C, NP        And   LORazepam  (ATIVAN ) injection 2 mg  2 mg Intramuscular TID PRN Zackarie Chason C, NP       haloperidol  lactate (HALDOL ) injection 10 mg  10 mg Intramuscular TID PRN Gia Lusher C, NP       And   diphenhydrAMINE  (BENADRYL ) injection 50 mg  50 mg Intramuscular TID PRN Nehan Flaum C, NP       And   LORazepam  (ATIVAN ) injection 2 mg  2 mg Intramuscular TID PRN Xyon Lukasik C, NP       hydrOXYzine  (ATARAX ) tablet 25 mg  25 mg Oral TID PRN Latesia Norrington C, NP   25 mg at 11/21/24 2112   magnesium  hydroxide (MILK OF MAGNESIA) suspension 30 mL  30 mL Oral Daily PRN Zyir Gassert C, NP       nicotine  (NICODERM CQ  - dosed in mg/24 hours) Floyd 21 mg  21 mg Transdermal Daily Jayra Choyce C, NP   21 mg at 11/22/24 9051   OLANZapine  (ZYPREXA ) tablet 10 mg  10 mg Oral QHS Maddisyn Hegwood C, NP   10 mg at 11/21/24 2112   Current Outpatient Medications  Medication Sig Dispense Refill   OLANZapine  (ZYPREXA ) 10 MG tablet Take 1 tablet (10 mg total) by mouth at  bedtime. 30 tablet 0    PTA Medications:  Facility Ordered Medications  Medication   acetaminophen  (TYLENOL ) tablet 650 mg   alum & mag hydroxide-simeth (MAALOX/MYLANTA) 200-200-20 MG/5ML suspension 30 mL   magnesium  hydroxide (MILK OF MAGNESIA) suspension 30 mL   haloperidol  (HALDOL ) tablet 5 mg   And   diphenhydrAMINE  (BENADRYL ) capsule 50 mg   haloperidol  lactate (HALDOL ) injection 5 mg   And   diphenhydrAMINE  (BENADRYL ) injection 50 mg   And   LORazepam  (ATIVAN ) injection 2 mg   haloperidol  lactate (HALDOL ) injection 10 mg   And   diphenhydrAMINE  (BENADRYL ) injection 50 mg   And   LORazepam  (ATIVAN ) injection 2 mg   hydrOXYzine  (ATARAX ) tablet 25 mg   nicotine  (NICODERM CQ  - dosed in mg/24 hours) Floyd 21 mg   OLANZapine  (ZYPREXA ) tablet 10 mg   PTA Medications  Medication Sig   OLANZapine  (ZYPREXA ) 10 MG tablet Take 1 tablet (10 mg total) by mouth at bedtime.        No data to display          Flowsheet Row ED from  11/21/2024 in [facility] ED from 11/19/2024 in [facility] at [facility] ED from 11/17/2024 in [facility]  C-SSRS RISK CATEGORY High Risk High Risk No Risk    Musculoskeletal  Strength & Muscle Tone: within normal limits Gait & Station: normal Patient leans: N/A  Psychiatric Specialty Exam  Presentation  General Appearance:  Disheveled  Eye Contact: Fair  Speech: Clear and Coherent; Normal Rate  Speech Volume: Normal  Handedness: Right   Mood and Affect  Mood: Dysphoric; Irritable; Hopeless  Affect: Congruent; Appropriate   Thought Process  Thought Processes: Coherent  Descriptions of Associations:Intact  Orientation:Full (Time, Place and Person)  Thought Content:Paranoid Ideation  Diagnosis of Schizophrenia or Schizoaffective disorder in past: No  Duration of Psychotic Symptoms: Less than six months   Hallucinations:Hallucinations: Auditory; Visual Description of Auditory Hallucinations: telling me that they are going to kill me Description of Visual Hallucinations: Different faces looking at me  Ideas of Reference:None  Suicidal Thoughts:Suicidal Thoughts: Yes, Passive SI Passive Intent and/or Plan: Without Plan  Homicidal Thoughts:Homicidal Thoughts: Yes, Passive HI Passive Intent and/or Plan: Without Intent; Without Plan   Sensorium  Memory: Recent Fair; Immediate Fair  Judgment: Poor  Insight: Lacking   Executive Functions  Concentration: Fair  Attention Span: Fair  Recall: [employer] of Knowledge: Fair  Language: Fair   Psychomotor Activity  Psychomotor Activity: Psychomotor Activity: Normal   Assets  Assets: Manufacturing Systems Engineer; Desire for Improvement; Financial Resources/Insurance; Housing; Resilience   Sleep  Sleep: Sleep: Fair  Estimated Sleeping Duration (Last 24 Hours): 12.50-15.25 hours  Nutritional  Assessment (For OBS and FBC admissions only) Has the patient had a weight loss or gain of 10 pounds or more in the last 3 months?: No Has the patient had a decrease in food intake/or appetite?: No Does the patient have dental problems?: No Does the patient have eating habits or behaviors that may be indicators of an eating disorder including binging or inducing vomiting?: No Has the patient recently lost weight without trying?: 0 Has the patient been eating poorly because of a decreased appetite?: 0 Malnutrition Screening Tool Score: 0    Physical Exam  Physical Exam Vitals and nursing note reviewed.  Constitutional:      Appearance: Normal appearance.  HENT:     Head: Normocephalic.  Nose: Nose normal.  Eyes:     Conjunctiva/sclera: Conjunctivae normal.  Cardiovascular:     Rate and Rhythm: Normal rate.  Pulmonary:     Effort: Pulmonary effort is normal.  Musculoskeletal:        General: Normal range of motion.     Cervical back: Normal range of motion.  Neurological:     General: No focal deficit present.     Mental Status: He is alert and oriented to person, place, and time.    Review of Systems  Constitutional: Negative.   HENT: Negative.    Eyes: Negative.   Respiratory: Negative.    Cardiovascular: Negative.   Gastrointestinal: Negative.   Genitourinary: Negative.   Musculoskeletal: Negative.   Neurological: Negative.   Endo/Heme/Allergies: Negative.   Psychiatric/Behavioral:  Positive for depression, hallucinations, substance abuse and suicidal ideas.    Blood pressure 120/84, pulse 74, temperature 98.5 F (36.9 C), temperature source Oral, resp. rate 18, SpO2 100%. There is no height or weight on file to calculate BMI.   Plan Of Care/Follow-up recommendations:  Based on re-assessment today, pt is recommended for treatment in the Facility Based Crisis Unit for mood stabilization and hallucinations related to methamphetamine abuse. Patient with a  psychiatric history of bipolar disorder, substance induced psychosis and substance induced mood disorder, along with a substance use history of methamphetamine use, cocaine use and alcohol use disorder.  Cory Floyd presents reporting suicidal ideation, visual hallucinations and auditory hallucinations. Pt denied substance use however UDS was positive for methamphetamines. Originally planned to restart meds and stabilize overnight however, pt continues to verbalize passive suicidal ideations, depressive symptoms and auditory hallucinations in the context of methamphetamine use. Will continue Olanzapine  10mg  at bedtime for mood. At this time, pt is not fully invested in maintaining his sobriety because he does not have much knowledge regarding substance abuse treatment.  He is open to discussing the options and learning about programs that may help him with substance abuse issues with social work and peer support .   Disposition: Pt accepted to [facility]  Alan JAYSON Mcardle, NP 11/22/2024, 10:58 AM

## 2024-11-23 MED ORDER — OLANZAPINE 7.5 MG PO TABS
15.0000 mg | ORAL_TABLET | Freq: Every day | ORAL | Status: DC
  Administered 2024-11-23 – 2024-11-25 (×3): 15 mg via ORAL
  Filled 2024-11-23 (×4): qty 2

## 2024-11-23 NOTE — Group Note (Signed)
 Group Topic: Relapse and Recovery  Group Date: 11/23/2024 Start Time: 1315 End Time: 1400 Facilitators: Clodfelter-Simmons, Nixie Laube LITTIE, NT  Department: [facility]  Number of Participants: 8  Group Focus: substance abuse education Treatment Modality:  Interpersonal Therapy Interventions utilized were patient education Purpose: relapse prevention strategies  Name: Cory Floyd Date of Birth: [birth year]  MR: [id number]    Level of Participation: did not attend Quality of Participation: na Interactions with others: na Mood/Affect: na Triggers (if applicable): na Cognition: na Progress: Other Response: na Plan: patient will be encouraged to at least attend one goup  Patients Problems:  Patient Active Problem List   Diagnosis Date Noted   Moderate stimulant use disorder (HCC) 11/17/2024   Substance-induced psychotic disorder (HCC) 11/17/2024   Methamphetamine use disorder, severe (HCC) 09/06/2024   Bipolar affective disorder, current episode manic (HCC) 09/05/2024   Malingering 04/02/2024   Rhabdomyolysis 01/25/2023   Dehydration 01/25/2023   Alcohol use disorder 01/25/2023   AKI (acute kidney injury) 12/27/2022   Visual hallucinations 12/20/2022   Cellulitis 12/19/2022   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   Bipolar disorder with severe depression (HCC) 05/02/2021   Bipolar 1 disorder, depressed, severe (HCC) 04/02/2021   Cocaine abuse with cocaine-induced mood disorder (HCC) 04/02/2021   Bipolar disorder (HCC) 01/24/2021   Substance induced mood disorder (HCC) 07/26/2020   Suicidal ideation    Polysubstance dependence (HCC) 07/25/2020   Bipolar affective disorder, depressed, moderate (HCC) 03/06/2020   Psychoactive substance-induced psychosis (HCC) 10/20/2019   Bipolar 1 disorder (HCC) 09/30/2019   Arthritis 09/30/2019   Gastroesophageal reflux disease without esophagitis 09/21/2016   Obstructive sleep apnea on CPAP 09/21/2016   Morbid obesity  with BMI of 40.0-44.9, adult (HCC) 08/15/2016   Stable angina pectoris 08/15/2016

## 2024-11-23 NOTE — ED Notes (Signed)
 Patient resting with equal and unlabored respirations. No appeared or reported distress. Monitor for safety.

## 2024-11-23 NOTE — ED Notes (Signed)
 Pt sleeping in bed, no acute distress noted. Respirations even and unlabored. Continue to monitor for safety.

## 2024-11-23 NOTE — Group Note (Signed)
 Group Topic: Understanding Self  Group Date: 11/23/2024 Start Time: 1200 End Time: 1230 Facilitators: Leron Charolette CROME, RN  Department: [facility]  Number of Participants: 6  Group Focus: goals/reality orientation Treatment Modality:  Leisure Development Interventions utilized were problem solving Purpose: express feelings  Name: Cory Floyd Date of Birth: Mar 11, 1976  MR: [id number]    Level of Participation: : moderate Quality of Participation: cooperative Interactions with others: gave feedback Mood/Affect: appropriate Triggers (if applicable): NA  Cognition: coherent/clear Progress: Moderate Response: Progressive  Plan: patient will be encouraged to participate in Group   Patients Problems:  Patient Active Problem List   Diagnosis Date Noted   Moderate stimulant use disorder (HCC) 11/17/2024   Substance-induced psychotic disorder (HCC) 11/17/2024   Methamphetamine use disorder, severe (HCC) 09/06/2024   Bipolar affective disorder, current episode manic (HCC) 09/05/2024   Malingering 04/02/2024   Rhabdomyolysis 01/25/2023   Dehydration 01/25/2023   Alcohol use disorder 01/25/2023   AKI (acute kidney injury) 12/27/2022   Visual hallucinations 12/20/2022   Cellulitis 12/19/2022   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   Bipolar disorder with severe depression (HCC) 05/02/2021   Bipolar 1 disorder, depressed, severe (HCC) 04/02/2021   Cocaine abuse with cocaine-induced mood disorder (HCC) 04/02/2021   Bipolar disorder (HCC) 01/24/2021   Substance induced mood disorder (HCC) 07/26/2020   Suicidal ideation    Polysubstance dependence (HCC) 07/25/2020   Bipolar affective disorder, depressed, moderate (HCC) 03/06/2020   Psychoactive substance-induced psychosis (HCC) 10/20/2019   Bipolar 1 disorder (HCC) 09/30/2019   Arthritis 09/30/2019   Gastroesophageal reflux disease without esophagitis 09/21/2016   Obstructive sleep apnea on CPAP 09/21/2016    Morbid obesity with BMI of 40.0-44.9, adult (HCC) 08/15/2016   Stable angina pectoris 08/15/2016

## 2024-11-23 NOTE — ED Notes (Signed)
 Pt calm resting in no acute distress. RR even and unlabored. Environment secured. Will continue to monitor for safety.

## 2024-11-23 NOTE — ED Notes (Signed)
 Pt endorses passive SI, no plan, contracts for safety. Pt denies HI/AVH. No reports of pain. Compliant with scheduled meds; prn atarax  given. He was anxious, cooperative, and depressed. No behavioral concerns at this time. Pt sleeping in bed, no acute distress noted. Respirations even and unlabored. Continue to monitor for safety.

## 2024-11-23 NOTE — ED Notes (Signed)
Pt resting in no acute distress. RR even and unlabored. Environment secured. Will continue to monitor for safety.

## 2024-11-23 NOTE — ED Notes (Addendum)
 Patient isolative to room and guarded when talking with RN. Presents with high anxiety r/t having bad dream just prior to meeting with RN. Provided PRN hydroxyzine . Monitor for safety.

## 2024-11-23 NOTE — Group Note (Signed)
 Group Topic: Positive Affirmations  Group Date: 11/23/2024 Start Time: 2000 End Time: 2030 Facilitators: Joshua Ellouise CROME  Department: [facility]  Number of Participants: 5  Group Focus: check in Treatment Modality:  Leisure Development Interventions utilized were support Purpose: reinforce self-care  Name: Cory Floyd Date of Birth: 30-Aug-1976  MR: [id number]    Level of Participation: Pt did not attend group after verbal prompt. Quality of Participation:  Interactions with others:  Mood/Affect:  Triggers (if applicable):  Cognition:  Progress:  Response:  Plan:   Patients Problems:  Patient Active Problem List   Diagnosis Date Noted   Moderate stimulant use disorder (HCC) 11/17/2024   Substance-induced psychotic disorder (HCC) 11/17/2024   Methamphetamine use disorder, severe (HCC) 09/06/2024   Bipolar affective disorder, current episode manic (HCC) 09/05/2024   Malingering 04/02/2024   Rhabdomyolysis 01/25/2023   Dehydration 01/25/2023   Alcohol use disorder 01/25/2023   AKI (acute kidney injury) 12/27/2022   Visual hallucinations 12/20/2022   Cellulitis 12/19/2022   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   Bipolar disorder with severe depression (HCC) 05/02/2021   Bipolar 1 disorder, depressed, severe (HCC) 04/02/2021   Cocaine abuse with cocaine-induced mood disorder (HCC) 04/02/2021   Bipolar disorder (HCC) 01/24/2021   Substance induced mood disorder (HCC) 07/26/2020   Suicidal ideation    Polysubstance dependence (HCC) 07/25/2020   Bipolar affective disorder, depressed, moderate (HCC) 03/06/2020   Psychoactive substance-induced psychosis (HCC) 10/20/2019   Bipolar 1 disorder (HCC) 09/30/2019   Arthritis 09/30/2019   Gastroesophageal reflux disease without esophagitis 09/21/2016   Obstructive sleep apnea on CPAP 09/21/2016   Morbid obesity with BMI of 40.0-44.9, adult (HCC) 08/15/2016   Stable angina pectoris 08/15/2016

## 2024-11-24 MED ORDER — ENSURE PLUS HIGH PROTEIN PO LIQD: 237.0000 mL | Freq: Two times a day (BID) | ORAL | Status: DC

## 2024-11-24 NOTE — ED Notes (Signed)
 Patient appears resting with equal and unlabored respirations. No appeared or reported distress. Monitor for safety.

## 2024-11-24 NOTE — Group Note (Signed)
 Group Topic: Fears and Unhealthy Coping Skills  Group Date: 11/24/2024 Start Time: 1100 End Time: 1200 Facilitators: Shelbi Vaccaro, LCSW  Department: [facility]  Number of Participants: 7  Group Focus: acceptance, anxiety, communication, forgiveness, healthy friendships, leisure skills, substance abuse education, and suicide prevention skills Treatment Modality:  Cognitive Behavioral Therapy Interventions utilized were group exercise Purpose: enhance coping skills, explore maladaptive thinking, express irrational fears, relapse prevention strategies, and trigger / craving management  Writer utilized CBT to explore and complete the Safety Plan with each client.  Each client elaborated on coping skills, triggers, waring signs, support system, and treatment after FBC.  Clients were able to gain support on additional coping skills and how to identify additional warning signs.   Client reports no SI/HI and or AVH.  Each client was also asked  Name: Cory Floyd Date of Birth: December 23, 1975  MR: [id number]    Level of Participation: Client did not attend group Quality of Participation: NA Interactions with others: NA Mood/Affect: NA Triggers (if applicable): NA Cognition: NA Progress: NA Response: NA Plan: follow-up needed.  Client will continue to be encouraged to attend group .  Patients Problems:  Patient Active Problem List   Diagnosis Date Noted   Moderate stimulant use disorder (HCC) 11/17/2024   Substance-induced psychotic disorder (HCC) 11/17/2024   Methamphetamine use disorder, severe (HCC) 09/06/2024   Bipolar affective disorder, current episode manic (HCC) 09/05/2024   Malingering 04/02/2024   Rhabdomyolysis 01/25/2023   Dehydration 01/25/2023   Alcohol use disorder 01/25/2023   AKI (acute kidney injury) 12/27/2022   Visual hallucinations 12/20/2022   Cellulitis 12/19/2022   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   Bipolar disorder with severe  depression (HCC) 05/02/2021   Bipolar 1 disorder, depressed, severe (HCC) 04/02/2021   Cocaine abuse with cocaine-induced mood disorder (HCC) 04/02/2021   Bipolar disorder (HCC) 01/24/2021   Substance induced mood disorder (HCC) 07/26/2020   Suicidal ideation    Polysubstance dependence (HCC) 07/25/2020   Bipolar affective disorder, depressed, moderate (HCC) 03/06/2020   Psychoactive substance-induced psychosis (HCC) 10/20/2019   Bipolar 1 disorder (HCC) 09/30/2019   Arthritis 09/30/2019   Gastroesophageal reflux disease without esophagitis 09/21/2016   Obstructive sleep apnea on CPAP 09/21/2016   Morbid obesity with BMI of 40.0-44.9, adult (HCC) 08/15/2016   Stable angina pectoris 08/15/2016

## 2024-11-24 NOTE — ED Notes (Signed)
 Pt received at the beginning of the shift without complaints.  Safety maintained.

## 2024-11-24 NOTE — Group Note (Signed)
 Group Topic: Recovery Basics  Group Date: 11/24/2024 Start Time: 1300 End Time: 1400 Facilitators: Nicholaus Arlyne BIRCH, NT  Department: [facility]  Number of Participants: 6  Group Focus: daily focus Treatment Modality:  Psychoeducation Interventions utilized were reality testing Purpose: explore maladaptive thinking  Name: Cory Floyd Date of Birth: 24-Aug-1976  MR: [id number]    Level of Participation: active Quality of Participation: drowsy Interactions with others: active listening Mood/Affect: drowsy Triggers (if applicable): none Cognition: insightful Progress: Gaining insight Response: positive Plan: patient will be encouraged to continue recovery plan  Patients Problems:  Patient Active Problem List   Diagnosis Date Noted   Moderate stimulant use disorder (HCC) 11/17/2024   Substance-induced psychotic disorder (HCC) 11/17/2024   Methamphetamine use disorder, severe (HCC) 09/06/2024   Bipolar affective disorder, current episode manic (HCC) 09/05/2024   Malingering 04/02/2024   Rhabdomyolysis 01/25/2023   Dehydration 01/25/2023   Alcohol use disorder 01/25/2023   AKI (acute kidney injury) 12/27/2022   Visual hallucinations 12/20/2022   Cellulitis 12/19/2022   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   Bipolar disorder with severe depression (HCC) 05/02/2021   Bipolar 1 disorder, depressed, severe (HCC) 04/02/2021   Cocaine abuse with cocaine-induced mood disorder (HCC) 04/02/2021   Bipolar disorder (HCC) 01/24/2021   Substance induced mood disorder (HCC) 07/26/2020   Suicidal ideation    Polysubstance dependence (HCC) 07/25/2020   Bipolar affective disorder, depressed, moderate (HCC) 03/06/2020   Psychoactive substance-induced psychosis (HCC) 10/20/2019   Bipolar 1 disorder (HCC) 09/30/2019   Arthritis 09/30/2019   Gastroesophageal reflux disease without esophagitis 09/21/2016   Obstructive sleep apnea on CPAP 09/21/2016   Morbid  obesity with BMI of 40.0-44.9, adult (HCC) 08/15/2016   Stable angina pectoris 08/15/2016

## 2024-11-24 NOTE — ED Notes (Signed)
 Patient alert & oriented x4. Pt on unit throughout shift q15 checks completed. Pt denies intent to harm self or others. Denies AVH. No signs of acute distress noted. No inappropriate behaviors observed or reported. Encouraged patient to notify staff if any thoughts of harm towards self or others arise. Patient verbalizes understanding and agreement. Eating and fluid intake were adequate. Medications administered as ordered; no adverse effects noted.

## 2024-11-25 NOTE — Group Note (Signed)
 Group Topic: Wellness  Group Date: 11/25/2024 Start Time: 1200 End Time: 1230 Facilitators: Daved Tinnie HERO, RN  Department: [facility]  Number of Participants: 8  Group Focus: psychiatric education Treatment Modality:  Psychoeducation Interventions utilized were patient education Purpose: increase insight  Name: Cory Floyd Date of Birth: [birth year]  MR: [id number]    Level of Participation: moderate Quality of Participation: attentive Interactions with others: gave feedback Mood/Affect: appropriate Triggers (if applicable): n/a Cognition: coherent/clear Progress: Gaining insight Response: medications reviewed, pt expressed understanding  Plan: patient will be encouraged to attend future RN education groups  Patients Problems:  Patient Active Problem List   Diagnosis Date Noted   Moderate stimulant use disorder (HCC) 11/17/2024   Substance-induced psychotic disorder (HCC) 11/17/2024   Methamphetamine use disorder, severe (HCC) 09/06/2024   Bipolar affective disorder, current episode manic (HCC) 09/05/2024   Malingering 04/02/2024   Rhabdomyolysis 01/25/2023   Dehydration 01/25/2023   Alcohol use disorder 01/25/2023   AKI (acute kidney injury) 12/27/2022   Visual hallucinations 12/20/2022   Cellulitis 12/19/2022   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   Bipolar disorder with severe depression (HCC) 05/02/2021   Bipolar 1 disorder, depressed, severe (HCC) 04/02/2021   Cocaine abuse with cocaine-induced mood disorder (HCC) 04/02/2021   Bipolar disorder (HCC) 01/24/2021   Substance induced mood disorder (HCC) 07/26/2020   Suicidal ideation    Polysubstance dependence (HCC) 07/25/2020   Bipolar affective disorder, depressed, moderate (HCC) 03/06/2020   Psychoactive substance-induced psychosis (HCC) 10/20/2019   Bipolar 1 disorder (HCC) 09/30/2019   Arthritis 09/30/2019   Gastroesophageal reflux disease without esophagitis 09/21/2016    Obstructive sleep apnea on CPAP 09/21/2016   Morbid obesity with BMI of 40.0-44.9, adult (HCC) 08/15/2016   Stable angina pectoris 08/15/2016

## 2024-11-25 NOTE — ED Notes (Signed)
 Pt reports feeling 'aprehensive'. Pt denies si hi and avh, verbal contract for safety provided. Pt provided with snack, denies physical pain and discomforts. Las BM  two days ago, pt denies constipation. Pt currently attending MHT group, no signs of distress observed.

## 2024-11-25 NOTE — Care Management (Signed)
 [facility] Care Management...  Clinical Research Associate contacted [facility] for intake.  They report the will email the client a copy of their intake form.  Writer will complete the form and email it back to them.  [facility] is in network with the client's insurance.  She reports they can meet with her once per week and can arrange medication management appointment.

## 2024-11-25 NOTE — Group Note (Signed)
 Group Topic: Healthy Self Image and Positive Change  Group Date: 11/25/2024 Start Time: 2030 End Time: 2100 Facilitators: Anice Benton LABOR, NT  Department: [facility]  Number of Participants: 5  Group Focus: clarity of thought, feeling awareness/expression, goals/reality orientation, and self-awareness Treatment Modality:  Patient-Centered Therapy and Solution-Focused Therapy Interventions utilized were group exercise and support Purpose: enhance coping skills, increase insight, and regain self-worth  Name: Cory Floyd Date of Birth: [birth year]  MR: [id number]    Level of Participation: Did Not Attend  Quality of Participation: N/A Interactions with others: N/A Mood/Affect: N/A Triggers (if applicable): N/A Cognition: N/A Progress: None Response: N/A Plan: patient will be encouraged to attend groups   Patients Problems:  Patient Active Problem List   Diagnosis Date Noted   Moderate stimulant use disorder (HCC) 11/17/2024   Substance-induced psychotic disorder (HCC) 11/17/2024   Methamphetamine use disorder, severe (HCC) 09/06/2024   Bipolar affective disorder, current episode manic (HCC) 09/05/2024   Malingering 04/02/2024   Rhabdomyolysis 01/25/2023   Dehydration 01/25/2023   Alcohol use disorder 01/25/2023   AKI (acute kidney injury) 12/27/2022   Visual hallucinations 12/20/2022   Cellulitis 12/19/2022   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   Bipolar disorder with severe depression (HCC) 05/02/2021   Bipolar 1 disorder, depressed, severe (HCC) 04/02/2021   Cocaine abuse with cocaine-induced mood disorder (HCC) 04/02/2021   Bipolar disorder (HCC) 01/24/2021   Substance induced mood disorder (HCC) 07/26/2020   Suicidal ideation    Polysubstance dependence (HCC) 07/25/2020   Bipolar affective disorder, depressed, moderate (HCC) 03/06/2020   Psychoactive substance-induced psychosis (HCC) 10/20/2019   Bipolar 1 disorder (HCC) 09/30/2019    Arthritis 09/30/2019   Gastroesophageal reflux disease without esophagitis 09/21/2016   Obstructive sleep apnea on CPAP 09/21/2016   Morbid obesity with BMI of 40.0-44.9, adult (HCC) 08/15/2016   Stable angina pectoris 08/15/2016

## 2024-11-25 NOTE — Group Note (Signed)
 Group Topic: Identity and Relationships  Group Date: 11/25/2024 Start Time: 1415 End Time: 1500 Facilitators: Alyse Leilani LABOR, NT  Department: [facility]  Number of Participants: 7  Group Focus: acceptance, clarity of thought, and coping skills Treatment Modality:  Psychoeducation Interventions utilized were problem solving Purpose: express feelings  Name: Cory Floyd Date of Birth: [birth year]  MR: [id number]    Pt did not come to group  Patients Problems:  Patient Active Problem List   Diagnosis Date Noted   Moderate stimulant use disorder (HCC) 11/17/2024   Substance-induced psychotic disorder (HCC) 11/17/2024   Methamphetamine use disorder, severe (HCC) 09/06/2024   Bipolar affective disorder, current episode manic (HCC) 09/05/2024   Malingering 04/02/2024   Rhabdomyolysis 01/25/2023   Dehydration 01/25/2023   Alcohol use disorder 01/25/2023   AKI (acute kidney injury) 12/27/2022   Visual hallucinations 12/20/2022   Cellulitis 12/19/2022   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   Bipolar disorder with severe depression (HCC) 05/02/2021   Bipolar 1 disorder, depressed, severe (HCC) 04/02/2021   Cocaine abuse with cocaine-induced mood disorder (HCC) 04/02/2021   Bipolar disorder (HCC) 01/24/2021   Substance induced mood disorder (HCC) 07/26/2020   Suicidal ideation    Polysubstance dependence (HCC) 07/25/2020   Bipolar affective disorder, depressed, moderate (HCC) 03/06/2020   Psychoactive substance-induced psychosis (HCC) 10/20/2019   Bipolar 1 disorder (HCC) 09/30/2019   Arthritis 09/30/2019   Gastroesophageal reflux disease without esophagitis 09/21/2016   Obstructive sleep apnea on CPAP 09/21/2016   Morbid obesity with BMI of 40.0-44.9, adult (HCC) 08/15/2016   Stable angina pectoris 08/15/2016

## 2024-11-25 NOTE — ED Notes (Signed)
 Pt is sleeping long periods thru night without complaint. Safety maintained.

## 2024-11-25 NOTE — ED Notes (Signed)
 Pt generally isolative, minimal engagement with conversation. No concerns or complaints reported to RN.

## 2024-11-25 NOTE — ED Notes (Signed)
 Patient is resting comfortably.

## 2024-11-26 MED ORDER — NICOTINE 21 MG/24HR TD PT24: 21.0000 mg | MEDICATED_PATCH | Freq: Every day | TRANSDERMAL | 0 refills | Status: AC

## 2024-11-26 MED ORDER — OLANZAPINE 15 MG PO TABS: 15.0000 mg | ORAL_TABLET | Freq: Every day | ORAL | 0 refills | Status: AC

## 2024-11-26 NOTE — ED Notes (Signed)
 Patient alert & oriented x4. Pt denies intent to harm self or others. Denies AVH. No signs of acute distress noted. No inappropriate behaviors observed or reported. Encouraged patient to notify staff if any thoughts of harm towards self or others arise. Patient verbalizes understanding and agreement. Pt dc, verbalizes underatnding AVS. Pt refused to do safety plan and survey. Belongings returned to pt.

## 2024-11-26 NOTE — ED Notes (Addendum)
 Pt up at the beginning of the shift, seated in the community room interacting appropriately with others.  Preparing for breakfast with out complaints of distress or discomfort.  Safety maintained.

## 2024-11-26 NOTE — Group Note (Signed)
 Group Topic: Healthy Self Image and Positive Change  Group Date: 11/26/2024 Start Time: 1100 End Time: 1200 Facilitators: Jodye Scali, LCSW  Department: [facility]  Number of Participants: 6  Group Focus: abuse issues, chemical dependency education, feeling awareness/expression, forgiveness, personal responsibility, relapse prevention, self-esteem, and substance abuse education Treatment Modality:  Cognitive Behavioral Therapy Interventions utilized were group exercise, problem solving, and support Purpose: enhance coping skills, express feelings, express irrational fears, increase insight, regain self-worth, and relapse prevention strategies  Writer utilized a CBT activity called Gratitude Log.  The activity explored with each client things they are thankful for, best memories,  challenges/learned from those challenges, and their goals related to substance use.    Name: Cory Floyd Date of Birth: 1976/02/26  MR: [id number]    Level of Participation: withdrawn Quality of Participation: defensive, distractible, distracting to others, uncooperative, and withdrawn Interactions with others: Client would not share any information with the group.  Client left group early. Mood/Affect: agitated, anxious, and irritable Triggers (if applicable): Client did not share any information in group.  Client declined Cognition: NA Progress: None Response: Client wouldn't answer or participate in group when called on.  Client left group early.  Plan: follow-up as needed.  Client will be encouraged to attend groups but also participate.   Patients Problems:  Patient Active Problem List   Diagnosis Date Noted   Moderate stimulant use disorder (HCC) 11/17/2024   Substance-induced psychotic disorder (HCC) 11/17/2024   Methamphetamine use disorder, severe (HCC) 09/06/2024   Bipolar affective disorder, current episode manic (HCC) 09/05/2024   Malingering 04/02/2024    Rhabdomyolysis 01/25/2023   Dehydration 01/25/2023   Alcohol use disorder 01/25/2023   AKI (acute kidney injury) 12/27/2022   Visual hallucinations 12/20/2022   Cellulitis 12/19/2022   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   Bipolar disorder with severe depression (HCC) 05/02/2021   Bipolar 1 disorder, depressed, severe (HCC) 04/02/2021   Cocaine abuse with cocaine-induced mood disorder (HCC) 04/02/2021   Bipolar disorder (HCC) 01/24/2021   Substance induced mood disorder (HCC) 07/26/2020   Suicidal ideation    Polysubstance dependence (HCC) 07/25/2020   Bipolar affective disorder, depressed, moderate (HCC) 03/06/2020   Psychoactive substance-induced psychosis (HCC) 10/20/2019   Bipolar 1 disorder (HCC) 09/30/2019   Arthritis 09/30/2019   Gastroesophageal reflux disease without esophagitis 09/21/2016   Obstructive sleep apnea on CPAP 09/21/2016   Morbid obesity with BMI of 40.0-44.9, adult (HCC) 08/15/2016   Stable angina pectoris 08/15/2016

## 2024-11-26 NOTE — Discharge Instructions (Addendum)
 [facility] Care Management...  Based on what you have shared, a list of resources for outpatient therapy and psychiatry is provided below to get you started back on treatment.  It is imperative that you follow through with treatment within 5-7 days from the day of discharge to prevent any further risk to your safety or mental well-being.  You are not limited to the list provided.  In case of an urgent crisis, you may contact the Mobile Crisis Unit with [facility] at [phone].       Patient is instructed prior to discharge to:  Take all medications as prescribed by his/her mental healthcare provider. Report any adverse effects and or reactions from the medicines to his/her outpatient provider promptly. Keep all scheduled appointments, to ensure that you are getting refills on time and to avoid any interruption in your medication.  If you are unable to keep an appointment call to reschedule.  Be sure to follow-up with resources and follow-up appointments provided.  Patient has been instructed & cautioned: To not engage in alcohol and or illegal drug use while on prescription medicines. In the event of worsening symptoms, patient is instructed to call the crisis hotline, 911 and or go to the nearest ED for appropriate evaluation and treatment of symptoms. To follow-up with his/her primary care provider for your other medical issues, concerns and or health care needs.  Information: -National Suicide Prevention Lifeline 1-800-SUICIDE or 954 877 5418.  -988 offers 24/7 access to trained crisis counselors who can help people experiencing mental health-related distress. People can call or text 988 or chat 988lifeline.org for themselves or if they are worried about a loved one who may need crisis support.       Outpatient Services for Therapy and Medication Management for [facility] [address], [place], [address] [phone] phone  New  Patient Assessment/Therapy Walk-ins Monday and Wednesday: 8am until slots are full. Every 1st and 2nd Friday: 1pm - 5pm  NO ASSESSMENT/THERAPY WALK-INS ON TUESDAYS OR THURSDAYS  New Patient Psychiatry/Medication Management Walk-ins Monday-Friday: 8am-11am  For all walk-ins, we ask that you arrive by 7:30am because patient will be seen in the order of arrival.  Availability is limited; therefore, you may not be seen on the same day that you walk-in.  Our goal is to serve and meet the needs of our community to the best of our ability.   [facility] [address] [place], [place], [address] [phone] phone  [facility] [address] [place], [place], [address] [phone] phone [phone] phone ([address], [address], Anthem/Elevance, [address], [place], [address], [address], Healthy [place], [place], [place], [address], [employer]oc[employer]illips, [place], UHC, [employer], [place], Out of Network)  [employer], [place] [address] [place], [place], [address] [phone] phone ([place], Anthem/Elevance, Sanmina-sci Options/Carelon, BCBS, [address], [place], [place], [place], [place], [employer], [place], [place], [place], [facility])  [employer] [address] [place], [place], [address] [phone] phone (Medicaid, ask about other insurance)  [facility] [address], [address] [place], [place], [address] [phone] phone [phone] fax ([address] [place] , [place], [place], [place] Health Choice, UHC, GENERAL ELECTRIC, Self-Pay)  Sarah Lempka [address] [place], [place], [address] [phone] phone ([address], Anthem/Elevance, BCBS, [address], [place], [employer], [place], [place], [place], [employer], [place], [place], [place], [facility])  [facility] - 6-8 MONTH WAIT FOR THERAPY; SOONER FOR MEDICATION MANAGEMENT [address] [place], [place], [address] [address],5TH FLOOR phone ([address] Amer[employer]Health [address] - [place], [address], [place], [place], Friday Health Plans, [address], [address] Healthy [place], [place], [address], [place], [place], [place], [place], [place], UHC, Safeco Corporation, [place])  [facility] [address] [place], [place], [address] [phone] phone  [facility] [address] [place], [place], [address] [phone] phone  [facility] [address] [place], [place], [address] [phone] phone  [facility] - THERAPY ONLY [address]  [place], [place], [address] [phone] phone  [facility], [place] [address], [place], [address] [phone] phone  [facility] [address] [place], [place], [address] [phone] phone [phone] fax  [facility] [address] [place], [place], [address] [phone] phone [phone] fax  [facility] [phone] [address] [place], [place], [address] [phone] phone  Janit Griffins [address] [place], [place], [address]  [phone] phone  [facility]  (Adults Only) [address] [place], [place], [address]  [phone] phone [phone] fax  [facility] in [place]  phone [phone] (offenders on case by case basis)   PROVIDERS THAT ACCEPT MEDICARE  Elna Lo, MD      [facility]      [address]      [place], [place] [address]      [phone]       Jerrell Forehand, MD      [facility]      [address]      [place], [place] [address]      [phone]

## 2024-11-26 NOTE — ED Notes (Signed)
 Assumed care of pt at 1130am. Pt calm. No inappropriate behaviors observed or reported at this time. Environment secured. Safety checks in place per facility protocol.

## 2024-11-26 NOTE — Group Note (Signed)
 Group Topic: Relapse and Recovery  Group Date: 11/25/2024 Start Time: 1100 End Time: 1150 Facilitators: Gerome Jolly, NT  Department: [facility]  Number of Participants: 6  Group Focus: acceptance, chemical dependency education, goals/reality orientation, healthy friendships, relapse prevention, and substance abuse education Treatment Modality:  Cognitive Behavioral Therapy and Psychoeducation Interventions utilized were confrontation, exploration, story telling, and support Purpose: Facilitator provided patients with literature from the NA Just For Today Meditation for February 3rd reading We need each other. This was used to focus on the similarities of each of us  and not the differences, explore maladaptive thinking, express irrational fears, and relapse prevention strategies. Facilitator ended the meeting with NA reading We do Recover  Name: Cory Floyd Date of Birth: 1976-09-04  MR: [id number]    Level of Participation: Pt did not attend group Quality of Participation: na Interactions with others: na Mood/Affect: na Triggers (if applicable): na Cognition: na Progress: na Response: Pt did not attend group Plan: follow-up needed  Patients Problems:  Patient Active Problem List   Diagnosis Date Noted   Moderate stimulant use disorder (HCC) 11/17/2024   Substance-induced psychotic disorder (HCC) 11/17/2024   Methamphetamine use disorder, severe (HCC) 09/06/2024   Bipolar affective disorder, current episode manic (HCC) 09/05/2024   Malingering 04/02/2024   Rhabdomyolysis 01/25/2023   Dehydration 01/25/2023   Alcohol use disorder 01/25/2023   AKI (acute kidney injury) 12/27/2022   Visual hallucinations 12/20/2022   Cellulitis 12/19/2022   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   Bipolar disorder with severe depression (HCC) 05/02/2021   Bipolar 1 disorder, depressed, severe (HCC) 04/02/2021   Cocaine abuse with cocaine-induced mood disorder  (HCC) 04/02/2021   Bipolar disorder (HCC) 01/24/2021   Substance induced mood disorder (HCC) 07/26/2020   Suicidal ideation    Polysubstance dependence (HCC) 07/25/2020   Bipolar affective disorder, depressed, moderate (HCC) 03/06/2020   Psychoactive substance-induced psychosis (HCC) 10/20/2019   Bipolar 1 disorder (HCC) 09/30/2019   Arthritis 09/30/2019   Gastroesophageal reflux disease without esophagitis 09/21/2016   Obstructive sleep apnea on CPAP 09/21/2016   Morbid obesity with BMI of 40.0-44.9, adult (HCC) 08/15/2016   Stable angina pectoris 08/15/2016

## 2024-11-26 NOTE — ED Provider Notes (Signed)
 [facility]BC/OBS ASAP Discharge Summary  Date and Time: 11/26/2024 11:38 AM  Name: Cory Floyd  MRN:  [id number]   Discharge Diagnoses:  Final diagnoses:  Amphetamine abuse (HCC)  Substance-induced psychotic disorder (HCC)    Subjective: Patient states  I am going to try to follow up with outpatient mental health. It is hard for me because I never know exactly where I am going to be.  Cory Floyd resides with a friend in [place]. He is reluctant to return to friend's home, friend/roommate misuses stimulants.  Chart reviewed and patient discussed with attending psychiatrist, Dr. Raliegh, on 11/26/2024.  Patient evaluated by this nurse practitioner face-to-face.  Patient is reclined in [facility] room upon my approach, no apparent distress.  He is alert and oriented, pleasant and cooperative during assessment.  Patient presents with euthymic mood, congruent affect.   Cory Floyd denies symptoms withdrawal.  He denies cravings/urges to use stimulants.  He endorses average sleep and appetite.  Patient states energy level is okay.  Patient denies SI/HI/AVH.  There is no evidence of delusional thought content and no indication that patient is responding to internal stimuli.  Patient offered support and encouragement.  He declines any person contact for collateral information at this time.    Stay Summary: Cory Floyd was admitted for Substance induced mood disorder [facility]) and crisis management.  He was treated with the following medications, olanzapine  .  [name] was discharged with current medication and was instructed on how to take medications as prescribed. Medical problems were identified and treated as needed.  Home medications were restarted as appropriate.  Improvement was monitored by observation and Camellia FORBES Patch daily report of symptom reduction.  Emotional and mental status was monitored by daily evaluation completed by clinical staff.         DEONTAY LADNIER was evaluated by the treatment  team for stability and plans for continued recovery upon discharge.  SAMMIE DENNER motivation was an integral factor for scheduling further treatment.  Employment, transportation, bed availability, health status, family support, and any pending legal issues were also considered during his hospital stay.  He was offered further treatment options upon discharge including but not limited to Residential, Intensive Outpatient, and Outpatient treatment.  Cory Floyd will follow up with the resources provided.  Upon completion of this admission the YOVANNI FRENETTE was both mentally and medically stable for discharge denying suicidal/homicidal ideation, symptoms that would be consistent with psychosis (AVH, IOR, paranoia, etc).   On my interview today, day of discharge, 11/27/2023, Cory Floyd is in NAD, alert, oriented, calm, cooperative, and attentive, with normal affect, speech, and behavior. Objectively, there is no evidence of psychosis/ mania (able to converse coherently, linear and goal directed thought, no RIS, no distractibility, not pre-occupied, no FOI, etc) nor depression to the point of suicidality (able to concentrate, affect full and reactive, speech normal r/v/t, no psychomotor retardation/agitation, etc).  Overall, patient appears to be at the point, in the absence of inhibiting or disinhibiting symptoms, where he can successfully move to lesser restrictive setting for care.    Total Time spent with patient: 30 minutes  Past Psychiatric History: H/O substance-induced psychosis, paranoia. He carries the psychiatric diagnoses of methamphetamine use disorder, psychoactive substance induced psychosis, bipolar 1 disorder Past Medical History:  Past Medical History:  Diagnosis Date   Alcoholic (HCC)    clean for 3 years   Anginal pain    Arthritis    Bipolar 1 disorder (HCC)  COVID-19    Depressed    Diabetes mellitus without complication (HCC)    Dyspnea    Fatty liver     Hypertension    Schizophrenia (HCC)     Family History: None reported Family Psychiatric History: None reported Social History: Patient most recently residing with friends, friends trigger him to use substance. Patient denies access to weapons. Considering return to homeless shelter after residential substance use treatment. Patient is unemployed, receives disability income. He denies recent alcohol and substance use. Tobacco Cessation:  A prescription for an FDA-approved tobacco cessation medication provided at discharge  Current Medications:  Current Facility-Administered Medications  Medication Dose Route Frequency Provider Last Rate Last Admin   acetaminophen  (TYLENOL ) tablet 650 mg  650 mg Oral Q6H PRN Brent, Amanda C, NP       alum & mag hydroxide-simeth (MAALOX/MYLANTA) 200-200-20 MG/5ML suspension 30 mL  30 mL Oral Q4H PRN Brent, Amanda C, NP       haloperidol  (HALDOL ) tablet 5 mg  5 mg Oral TID PRN Brent, Amanda C, NP   5 mg at 11/24/24 9970   And   diphenhydrAMINE  (BENADRYL ) capsule 50 mg  50 mg Oral TID PRN Brent, Amanda C, NP   50 mg at 11/24/24 9970   haloperidol  lactate (HALDOL ) injection 5 mg  5 mg Intramuscular TID PRN Brent, Amanda C, NP       And   diphenhydrAMINE  (BENADRYL ) injection 50 mg  50 mg Intramuscular TID PRN Brent, Amanda C, NP       And   LORazepam  (ATIVAN ) injection 2 mg  2 mg Intramuscular TID PRN Brent, Amanda C, NP       haloperidol  lactate (HALDOL ) injection 10 mg  10 mg Intramuscular TID PRN Thresa Alan BROCKS, NP       And   diphenhydrAMINE  (BENADRYL ) injection 50 mg  50 mg Intramuscular TID PRN Brent, Amanda C, NP       And   LORazepam  (ATIVAN ) injection 2 mg  2 mg Intramuscular TID PRN Brent, Amanda C, NP       hydrOXYzine  (ATARAX ) tablet 25 mg  25 mg Oral TID PRN Brent, Amanda C, NP   25 mg at 11/26/24 9040   magnesium  hydroxide (MILK OF MAGNESIA) suspension 30 mL  30 mL Oral Daily PRN Brent, Amanda C, NP       nicotine  (NICODERM CQ  - dosed in mg/24  hours) patch 21 mg  21 mg Transdermal Daily Brent, Amanda C, NP       OLANZapine  (ZYPREXA ) tablet 15 mg  15 mg Oral QHS Lewis, Tanika N, NP   15 mg at 11/25/24 2151   Current Outpatient Medications  Medication Sig Dispense Refill   OLANZapine  (ZYPREXA ) 10 MG tablet Take 1 tablet (10 mg total) by mouth at bedtime. 30 tablet 0    PTA Medications:  Facility Ordered Medications  Medication   nicotine  (NICODERM CQ  - dosed in mg/24 hours) patch 21 mg   acetaminophen  (TYLENOL ) tablet 650 mg   alum & mag hydroxide-simeth (MAALOX/MYLANTA) 200-200-20 MG/5ML suspension 30 mL   magnesium  hydroxide (MILK OF MAGNESIA) suspension 30 mL   haloperidol  (HALDOL ) tablet 5 mg   And   diphenhydrAMINE  (BENADRYL ) capsule 50 mg   haloperidol  lactate (HALDOL ) injection 5 mg   And   diphenhydrAMINE  (BENADRYL ) injection 50 mg   And   LORazepam  (ATIVAN ) injection 2 mg   haloperidol  lactate (HALDOL ) injection 10 mg   And   diphenhydrAMINE  (BENADRYL ) injection 50 mg  And   LORazepam  (ATIVAN ) injection 2 mg   hydrOXYzine  (ATARAX ) tablet 25 mg   OLANZapine  (ZYPREXA ) tablet 15 mg   PTA Medications  Medication Sig   OLANZapine  (ZYPREXA ) 10 MG tablet Take 1 tablet (10 mg total) by mouth at bedtime.       11/23/2024   12:03 PM  Depression screen PHQ 2/9  Decreased Interest 2  Down, Depressed, Hopeless 3  PHQ - 2 Score 5  Altered sleeping 2  Tired, decreased energy 3  Change in appetite 2  Feeling bad or failure about yourself  3  Trouble concentrating 3  Moving slowly or fidgety/restless 2  Suicidal thoughts 3  PHQ-9 Score 23  Difficult doing work/chores Very difficult    Flowsheet Row ED from 11/22/2024 in [facility] ED from 11/21/2024 in [facility] ED from 11/19/2024 in [facility] Emergency Department at [facility]  C-SSRS RISK CATEGORY High Risk High Risk High Risk    Musculoskeletal  Strength & Muscle Tone: within normal  limits Gait & Station: normal Patient leans: N/A  Psychiatric Specialty Exam  Presentation  General Appearance:  Appropriate for Environment; Casual  Eye Contact: Fair  Speech: Normal Rate; Clear and Coherent  Speech Volume: Normal  Handedness: Right   Mood and Affect  Mood: Euthymic  Affect: Congruent; Appropriate   Thought Process  Thought Processes: Coherent; Goal Directed; Linear  Descriptions of Associations:Intact  Orientation:Full (Time, Place and Person)  Thought Content:Logical; WDL  Diagnosis of Schizophrenia or Schizoaffective disorder in past: Yes    Hallucinations:Hallucinations: None  Ideas of Reference:None  Suicidal Thoughts:Suicidal Thoughts: No  Homicidal Thoughts:Homicidal Thoughts: No   Sensorium  Memory: Immediate Good; Recent Fair  Judgment: Fair  Insight: Fair   Art Therapist  Concentration: Good  Attention Span: Good  Recall: Good  Fund of Knowledge: Fair  Language: Fair   Psychomotor Activity  Psychomotor Activity: Psychomotor Activity: Normal   Assets  Assets: Communication Skills; Desire for Improvement; Financial Resources/Insurance; Social Support   Sleep  Sleep: Sleep: Good  Estimated Sleeping Duration (Last 24 Hours): 14.75-15.75 hours  No data recorded  Physical Exam  Physical Exam Vitals and nursing note reviewed.  Constitutional:      Appearance: Normal appearance. He is well-developed.  HENT:     Head: Normocephalic and atraumatic.     Nose: Nose normal.  Cardiovascular:     Rate and Rhythm: Normal rate.  Pulmonary:     Effort: Pulmonary effort is normal.  Musculoskeletal:        General: Normal range of motion.     Cervical back: Normal range of motion.  Skin:    General: Skin is warm and dry.  Neurological:     Mental Status: He is alert and oriented to person, place, and time.  Psychiatric:        Attention and Perception: Attention and perception normal.         Mood and Affect: Mood and affect normal.        Speech: Speech normal.        Behavior: Behavior normal. Behavior is cooperative.        Thought Content: Thought content normal.        Cognition and Memory: Cognition and memory normal.        Judgment: Judgment normal.    Review of Systems  Constitutional: Negative.   HENT: Negative.    Eyes: Negative.   Respiratory: Negative.    Cardiovascular:  Negative.   Gastrointestinal: Negative.   Genitourinary: Negative.   Musculoskeletal: Negative.   Skin: Negative.   Neurological: Negative.   Psychiatric/Behavioral: Negative.     Blood pressure 122/70, pulse 66, temperature 97.9 F (36.6 C), temperature source Oral, resp. rate 16, SpO2 99%. There is no height or weight on file to calculate BMI.  Demographic Factors:  Male, Caucasian, and Unemployed  Loss Factors: Legal issues  Historical Factors: NA  Risk Reduction Factors:   Living with another person, especially a relative, Positive social support, Positive therapeutic relationship, and Positive coping skills or problem solving skills  Continued Clinical Symptoms:  Alcohol/Substance Abuse/Dependencies Previous Psychiatric Diagnoses and Treatments  Cognitive Features That Contribute To Risk:  None    Suicide Risk:  Minimal: No identifiable suicidal ideation.  Patients presenting with no risk factors but with morbid ruminations; may be classified as minimal risk based on the severity of the depressive symptoms  Plan Of Care/Follow-up recommendations:  The patient is able to verbalize their individual safety plan to this provider.   # It was discussed with the patient, the impact of alcohol, drugs, tobacco have been there overall psychiatric and medical wellbeing, and total abstinence from substance use was recommended the patient. Recommend abstinence from alcohol, tobacco, and other illicit drug use at discharge.  Follow-up with substance use treatment options  provided.   # Prescriptions provided or sent directly to preferred pharmacy at discharge. Patient agreeable to plan. Given opportunity to ask questions. Appears to feel comfortable with discharge.    # In the event of worsening symptoms, the patient is instructed to call the crisis hotline, 911 and or go to the nearest ED for appropriate evaluation and treatment of symptoms. To follow-up with primary care provider for other medical issues, concerns and or health care needs    -Follow-up with your outpatient psychiatric provider resources including contact information [facility] health.      -Follow-up with outpatient primary care doctor and other specialists -for management of preventative medicine and chronic medical disease.      -If suicidal thoughts recur, call your outpatient psychiatric provider, 911, [phone] or go to the nearest emergency department.    Medications: -Nicotine  21 mg transdermal patch onto skin daily/nicotine  dependence -Olanzapine  15 mg nightly/mood, hallucinations   Disposition: Discharge  Note: This document was prepared using Dragon voice recognition software and may include unintentional dictation errors.  Ellouise LITTIE Dawn, FNP 11/26/2024, 11:38 AM

## 2024-11-26 NOTE — Care Management (Signed)
 [facility] Care Management...   Writer provided patient with residential, outpatient and med management resources  Patient will discharge today Wednesday 11/26/2024 to home  [address]  [place] [address]   Scripts  Bus passes  Patient is recommended to follow up with resources provided in AVS

## 2024-11-26 NOTE — ED Notes (Signed)
 Pt resting quietly in bed appears to be sleeping.  Pt took prns and is able to discuss indications.  Denies thoughts to self harm or experience AVH. Pt able to stay with a particular theme and engage in conversation appropriately.  Pt is calm and cooperative and observed to turn and reposition during safety round observation. Safety maintained.
